# Patient Record
Sex: Male | Born: 1955 | Race: White | Hispanic: No | Marital: Married | Smoking: Never smoker
Health system: Southern US, Community
[De-identification: ages and names within clinical notes are randomized; demographics above are authoritative.]

## PROBLEM LIST (undated history)

## (undated) DIAGNOSIS — K5790 Diverticulosis of intestine, part unspecified, without perforation or abscess without bleeding: Secondary | ICD-10-CM

## (undated) DIAGNOSIS — E119 Type 2 diabetes mellitus without complications: Secondary | ICD-10-CM

## (undated) DIAGNOSIS — F32A Depression, unspecified: Secondary | ICD-10-CM

## (undated) DIAGNOSIS — N200 Calculus of kidney: Secondary | ICD-10-CM

## (undated) DIAGNOSIS — I2510 Atherosclerotic heart disease of native coronary artery without angina pectoris: Secondary | ICD-10-CM

## (undated) DIAGNOSIS — Z7901 Long term (current) use of anticoagulants: Secondary | ICD-10-CM

## (undated) DIAGNOSIS — I499 Cardiac arrhythmia, unspecified: Secondary | ICD-10-CM

## (undated) DIAGNOSIS — I700 Atherosclerosis of aorta: Secondary | ICD-10-CM

## (undated) DIAGNOSIS — K8020 Calculus of gallbladder without cholecystitis without obstruction: Secondary | ICD-10-CM

## (undated) DIAGNOSIS — I5189 Other ill-defined heart diseases: Secondary | ICD-10-CM

## (undated) DIAGNOSIS — D649 Anemia, unspecified: Secondary | ICD-10-CM

## (undated) DIAGNOSIS — K635 Polyp of colon: Secondary | ICD-10-CM

## (undated) DIAGNOSIS — K2950 Unspecified chronic gastritis without bleeding: Secondary | ICD-10-CM

## (undated) DIAGNOSIS — I4891 Unspecified atrial fibrillation: Secondary | ICD-10-CM

## (undated) DIAGNOSIS — F419 Anxiety disorder, unspecified: Secondary | ICD-10-CM

## (undated) DIAGNOSIS — Z87442 Personal history of urinary calculi: Secondary | ICD-10-CM

## (undated) DIAGNOSIS — I480 Paroxysmal atrial fibrillation: Secondary | ICD-10-CM

## (undated) DIAGNOSIS — I10 Essential (primary) hypertension: Secondary | ICD-10-CM

## (undated) HISTORY — PX: OTHER SURGICAL HISTORY: SHX169

---

## 1998-10-07 ENCOUNTER — Encounter: Payer: Self-pay | Admitting: Family Medicine

## 2001-09-06 ENCOUNTER — Encounter: Payer: Self-pay | Admitting: Family Medicine

## 2002-04-06 ENCOUNTER — Encounter: Payer: Self-pay | Admitting: Family Medicine

## 2002-04-06 LAB — CONVERTED CEMR LAB: Hgb A1c MFr Bld: 5.8 %

## 2002-09-06 ENCOUNTER — Encounter: Payer: Self-pay | Admitting: Family Medicine

## 2002-09-06 LAB — CONVERTED CEMR LAB: Hgb A1c MFr Bld: 6.8 %

## 2002-12-07 ENCOUNTER — Encounter: Payer: Self-pay | Admitting: Family Medicine

## 2002-12-07 LAB — CONVERTED CEMR LAB: Hgb A1c MFr Bld: 6.4 %

## 2005-02-12 ENCOUNTER — Ambulatory Visit: Payer: Self-pay | Admitting: Family Medicine

## 2005-02-13 ENCOUNTER — Ambulatory Visit: Payer: Self-pay | Admitting: Family Medicine

## 2005-04-06 ENCOUNTER — Encounter: Payer: Self-pay | Admitting: Family Medicine

## 2005-04-06 LAB — CONVERTED CEMR LAB: Hgb A1c MFr Bld: 6.5 %

## 2005-04-21 ENCOUNTER — Ambulatory Visit: Payer: Self-pay | Admitting: Family Medicine

## 2005-05-07 ENCOUNTER — Encounter: Payer: Self-pay | Admitting: Family Medicine

## 2005-05-07 LAB — CONVERTED CEMR LAB: Hgb A1c MFr Bld: 6.6 %

## 2005-07-07 ENCOUNTER — Encounter: Payer: Self-pay | Admitting: Family Medicine

## 2005-07-29 ENCOUNTER — Ambulatory Visit: Payer: Self-pay | Admitting: Family Medicine

## 2005-08-03 ENCOUNTER — Ambulatory Visit: Payer: Self-pay | Admitting: Family Medicine

## 2005-09-06 ENCOUNTER — Encounter: Payer: Self-pay | Admitting: Family Medicine

## 2005-09-06 LAB — CONVERTED CEMR LAB: Microalbumin U total vol: 5.4 mg/L

## 2005-09-07 ENCOUNTER — Ambulatory Visit: Payer: Self-pay | Admitting: Family Medicine

## 2005-09-09 ENCOUNTER — Ambulatory Visit: Payer: Self-pay | Admitting: Family Medicine

## 2006-01-07 ENCOUNTER — Ambulatory Visit: Payer: Self-pay | Admitting: Family Medicine

## 2006-01-07 LAB — CONVERTED CEMR LAB: Hgb A1c MFr Bld: 6.8 %

## 2006-05-24 ENCOUNTER — Ambulatory Visit: Payer: Self-pay | Admitting: Family Medicine

## 2006-06-06 ENCOUNTER — Encounter: Payer: Self-pay | Admitting: Family Medicine

## 2006-06-23 ENCOUNTER — Ambulatory Visit: Payer: Self-pay | Admitting: Family Medicine

## 2006-09-06 ENCOUNTER — Encounter: Payer: Self-pay | Admitting: Family Medicine

## 2006-09-06 LAB — CONVERTED CEMR LAB
Hgb A1c MFr Bld: 6.6 %
PSA: 0.47 ng/mL

## 2006-09-22 ENCOUNTER — Ambulatory Visit: Payer: Self-pay | Admitting: Family Medicine

## 2006-09-23 ENCOUNTER — Ambulatory Visit: Payer: Self-pay | Admitting: Family Medicine

## 2006-11-05 ENCOUNTER — Ambulatory Visit: Payer: Self-pay | Admitting: Family Medicine

## 2007-03-07 ENCOUNTER — Ambulatory Visit: Payer: Self-pay | Admitting: Family Medicine

## 2007-03-07 LAB — CONVERTED CEMR LAB
Alkaline Phosphatase: 72 units/L (ref 39–117)
Bilirubin, Direct: 0.1 mg/dL (ref 0.0–0.3)
CO2: 24 meq/L (ref 19–32)
Cholesterol: 152 mg/dL (ref 0–200)
Creatinine, Ser: 1 mg/dL (ref 0.4–1.5)
GFR calc Af Amer: 102 mL/min
Glucose, Bld: 133 mg/dL — ABNORMAL HIGH (ref 70–99)
HDL: 31.7 mg/dL — ABNORMAL LOW (ref >39.0)
Hgb A1c MFr Bld: 5.9 %
Microalb Creat Ratio: 2.4 mg/g (ref 0.0–30.0)
Microalb, Ur: 0.5 mg/dL (ref 0.0–1.9)
PSA: 0.69 ng/mL
Potassium: 4.2 meq/L (ref 3.5–5.1)
Sodium: 139 meq/L (ref 135–145)
TSH: 2.84 microintl units/mL (ref 0.35–5.50)
Total Bilirubin: 0.7 mg/dL (ref 0.3–1.2)
Total CHOL/HDL Ratio: 4.8
Total Protein: 6.6 g/dL (ref 6.0–8.3)
Triglycerides: 166 mg/dL — ABNORMAL HIGH (ref 0–149)

## 2007-03-08 ENCOUNTER — Encounter: Payer: Self-pay | Admitting: Family Medicine

## 2007-03-08 DIAGNOSIS — E119 Type 2 diabetes mellitus without complications: Secondary | ICD-10-CM

## 2007-03-08 DIAGNOSIS — I10 Essential (primary) hypertension: Secondary | ICD-10-CM

## 2007-03-08 DIAGNOSIS — F411 Generalized anxiety disorder: Secondary | ICD-10-CM | POA: Insufficient documentation

## 2007-03-08 DIAGNOSIS — Z87442 Personal history of urinary calculi: Secondary | ICD-10-CM

## 2007-03-08 DIAGNOSIS — E785 Hyperlipidemia, unspecified: Secondary | ICD-10-CM

## 2007-03-09 ENCOUNTER — Ambulatory Visit: Payer: Self-pay | Admitting: Family Medicine

## 2007-03-26 DIAGNOSIS — B2790 Infectious mononucleosis, unspecified without complication: Secondary | ICD-10-CM

## 2007-06-08 ENCOUNTER — Encounter (INDEPENDENT_AMBULATORY_CARE_PROVIDER_SITE_OTHER): Payer: Self-pay | Admitting: *Deleted

## 2007-06-08 ENCOUNTER — Ambulatory Visit: Payer: Self-pay | Admitting: Family Medicine

## 2007-08-24 ENCOUNTER — Encounter (INDEPENDENT_AMBULATORY_CARE_PROVIDER_SITE_OTHER): Payer: Self-pay | Admitting: *Deleted

## 2007-09-05 ENCOUNTER — Ambulatory Visit: Payer: Self-pay | Admitting: Family Medicine

## 2007-09-19 ENCOUNTER — Ambulatory Visit: Payer: Self-pay | Admitting: Family Medicine

## 2007-10-31 ENCOUNTER — Encounter: Payer: Self-pay | Admitting: Family Medicine

## 2008-03-15 ENCOUNTER — Ambulatory Visit: Payer: Self-pay | Admitting: Family Medicine

## 2008-03-15 LAB — CONVERTED CEMR LAB
Albumin: 4.1 g/dL (ref 3.5–5.2)
BUN: 12 mg/dL (ref 6–23)
Calcium: 9.2 mg/dL (ref 8.4–10.5)
Cholesterol: 147 mg/dL (ref 0–200)
Creatinine, Ser: 1 mg/dL (ref 0.4–1.5)
Creatinine,U: 168.2 mg/dL
GFR calc Af Amer: 101 mL/min
GFR calc non Af Amer: 83 mL/min
Hgb A1c MFr Bld: 6.9 % — ABNORMAL HIGH (ref 4.6–6.0)
LDL Cholesterol: 84 mg/dL (ref 0–99)
Microalb Creat Ratio: 3.6 mg/g (ref 0.0–30.0)
Microalb, Ur: 0.6 mg/dL (ref 0.0–1.9)
Total Bilirubin: 0.8 mg/dL (ref 0.3–1.2)
Total CHOL/HDL Ratio: 5.4
Triglycerides: 181 mg/dL — ABNORMAL HIGH (ref 0–149)
VLDL: 36 mg/dL (ref 0–40)

## 2008-03-19 ENCOUNTER — Ambulatory Visit: Payer: Self-pay | Admitting: Family Medicine

## 2008-08-10 ENCOUNTER — Ambulatory Visit: Payer: Self-pay | Admitting: Family Medicine

## 2008-08-15 LAB — CONVERTED CEMR LAB
ALT: 29 units/L (ref 0–53)
Albumin: 4.1 g/dL (ref 3.5–5.2)
Alkaline Phosphatase: 65 units/L (ref 39–117)
BUN: 11 mg/dL (ref 6–23)
CO2: 25 meq/L (ref 19–32)
Calcium: 8.9 mg/dL (ref 8.4–10.5)
GFR calc Af Amer: 90 mL/min
Glucose, Bld: 117 mg/dL — ABNORMAL HIGH (ref 70–99)
LDL Cholesterol: 84 mg/dL (ref 0–99)
Microalb Creat Ratio: 6.3 mg/g (ref 0.0–30.0)
Microalb, Ur: 1.3 mg/dL (ref 0.0–1.9)
Potassium: 4.2 meq/L (ref 3.5–5.1)
Sodium: 140 meq/L (ref 135–145)
TSH: 1.82 microintl units/mL (ref 0.35–5.50)
Total CHOL/HDL Ratio: 5.5
Total Protein: 7 g/dL (ref 6.0–8.3)
Triglycerides: 175 mg/dL — ABNORMAL HIGH (ref 0–149)

## 2009-04-08 ENCOUNTER — Telehealth: Payer: Self-pay | Admitting: Family Medicine

## 2010-07-15 ENCOUNTER — Encounter (INDEPENDENT_AMBULATORY_CARE_PROVIDER_SITE_OTHER): Payer: Self-pay | Admitting: *Deleted

## 2011-01-06 NOTE — Letter (Signed)
Summary: Nadara Eaton letter  [place] at [facility]  [address] [place], [place] [address]   Phone: [phone]  Fax: [phone]       07/15/2010 MRN: [id number]  SHAQUEL CHAVOUS [address] [place], [place]  [address]  Dear Mr. Arville Care,  [facility] [place] and [place] announce the retirement of Arta Silence, M.D., from full-time practice at the [facility] office effective June 05, 2010 and his plans of returning part-time.  It is important to Dr. Hetty Ely and to our practice that you understand that [facility] has seven physicians in our office for your health care needs.  We will continue to offer the same exceptional care that you have today.    Dr. Hetty Ely has spoken to many of you about his plans for retirement and returning part-time in the fall.   We will continue to work with you through the transition to schedule appointments for you in the office and meet the high standards that [facility] is committed to.   Again, it is with great pleasure that we share the news that Dr. Hetty Ely will return to [facility] in October of 2011 with a reduced schedule.    If you have any questions, or would like to request an appointment with one of our physicians, please call us at [phone] and press the option for Scheduling an appointment.  We take pleasure in providing you with excellent patient care and look forward to seeing you at your next office visit.  Our [facility] Physicians are:  Tillman Abide, M.D. Laurita Quint, M.D. Roxy Manns, M.D. Kerby Nora, M.D. Hannah Beat, M.D. Ruthe Mannan, M.D. We proudly welcomed Raechel Ache, M.D. and Eustaquio Boyden, M.D. to the practice in July/August 2011.  Sincerely,  [facility]

## 2012-06-13 ENCOUNTER — Emergency Department: Payer: Self-pay | Admitting: Emergency Medicine

## 2012-06-13 LAB — COMPREHENSIVE METABOLIC PANEL
Anion Gap: 12 (ref 7–16)
BUN: 32 mg/dL — ABNORMAL HIGH (ref 7–18)
Calcium, Total: 9.1 mg/dL (ref 8.5–10.1)
Chloride: 97 mmol/L — ABNORMAL LOW (ref 98–107)
Co2: 20 mmol/L — ABNORMAL LOW (ref 21–32)
EGFR (African American): 33 — ABNORMAL LOW
EGFR (Non-African Amer.): 28 — ABNORMAL LOW
Osmolality: 277 (ref 275–301)
Potassium: 4.4 mmol/L (ref 3.5–5.1)
SGOT(AST): 118 U/L — ABNORMAL HIGH (ref 15–37)
SGPT (ALT): 80 U/L — ABNORMAL HIGH
Total Protein: 8.4 g/dL — ABNORMAL HIGH (ref 6.4–8.2)

## 2012-06-13 LAB — CBC
Platelet: 87 10*3/uL — ABNORMAL LOW (ref 150–440)
RDW: 12.8 % (ref 11.5–14.5)

## 2012-06-13 LAB — URINALYSIS, COMPLETE
Granular Cast: 7
Hyaline Cast: 4
Nitrite: NEGATIVE
Ph: 5 (ref 4.5–8.0)
Protein: 100
Specific Gravity: 1.026 (ref 1.003–1.030)

## 2012-06-13 LAB — CK TOTAL AND CKMB (NOT AT ARMC)
CK, Total: 106 U/L (ref 35–232)
CK-MB: <0.5 ng/mL — ABNORMAL LOW (ref 0.5–3.6)

## 2012-06-13 LAB — LIPASE, BLOOD: Lipase: 168 U/L (ref 73–393)

## 2012-06-16 ENCOUNTER — Inpatient Hospital Stay: Payer: Self-pay | Admitting: Internal Medicine

## 2012-06-16 LAB — CBC
MCV: 90 fL (ref 80–100)
Platelet: 39 10*3/uL — ABNORMAL LOW (ref 150–440)
RBC: 5.01 10*6/uL (ref 4.40–5.90)
RDW: 13.2 % (ref 11.5–14.5)
WBC: 10.7 10*3/uL — ABNORMAL HIGH (ref 3.8–10.6)

## 2012-06-16 LAB — APTT: Activated PTT: 34 s

## 2012-06-16 LAB — COMPREHENSIVE METABOLIC PANEL
Albumin: 3 g/dL — ABNORMAL LOW (ref 3.4–5.0)
Alkaline Phosphatase: 75 U/L (ref 50–136)
BUN: 36 mg/dL — ABNORMAL HIGH (ref 7–18)
Bilirubin,Total: 1.5 mg/dL — ABNORMAL HIGH (ref 0.2–1.0)
Co2: 19 mmol/L — ABNORMAL LOW (ref 21–32)
Creatinine: 1.66 mg/dL — ABNORMAL HIGH (ref 0.60–1.30)
EGFR (African American): 53 — ABNORMAL LOW
Glucose: 188 mg/dL — ABNORMAL HIGH (ref 65–99)
Osmolality: 276 (ref 275–301)
Potassium: 3.7 mmol/L (ref 3.5–5.1)
SGPT (ALT): 86 U/L — ABNORMAL HIGH
Sodium: 131 mmol/L — ABNORMAL LOW (ref 136–145)
Total Protein: 6.8 g/dL (ref 6.4–8.2)

## 2012-06-16 LAB — PROTIME-INR
INR: 1.2
Prothrombin Time: 15.8 secs — ABNORMAL HIGH (ref 11.5–14.7)

## 2012-06-16 LAB — CBC WITH DIFFERENTIAL/PLATELET
Basophil #: 0 10*3/uL (ref 0.0–0.1)
HCT: 42.3 % (ref 40.0–52.0)
HGB: 14.3 g/dL (ref 13.0–18.0)
Lymphocyte #: 2.2 10*3/uL (ref 1.0–3.6)
MCH: 30.4 pg (ref 26.0–34.0)
MCV: 90 fL (ref 80–100)
Monocyte #: 1.5 x10 3/mm — ABNORMAL HIGH (ref 0.2–1.0)
Monocyte %: 16.2 %
Neutrophil #: 5.6 10*3/uL (ref 1.4–6.5)
Neutrophil %: 59.1 %
Platelet: 45 10*3/uL — ABNORMAL LOW (ref 150–440)
RDW: 12.9 % (ref 11.5–14.5)

## 2012-06-16 LAB — HEMOGLOBIN: HGB: 14.4 g/dL (ref 13.0–18.0)

## 2012-06-16 LAB — LACTATE DEHYDROGENASE: LDH: 599 U/L — ABNORMAL HIGH (ref 87–241)

## 2012-06-16 LAB — CK TOTAL AND CKMB (NOT AT ARMC)
CK, Total: 155 U/L
CK, Total: 121 U/L (ref 35–232)
CK-MB: <0.5 ng/mL — ABNORMAL LOW

## 2012-06-16 LAB — PLATELET COUNT: Platelet: 46 10*3/uL — ABNORMAL LOW (ref 150–440)

## 2012-06-16 LAB — CREATININE, SERUM: EGFR (African American): 59 — ABNORMAL LOW

## 2012-06-16 LAB — TROPONIN I
Troponin-I: <0.02 ng/mL
Troponin-I: <0.02 ng/mL

## 2012-06-16 LAB — HEMOGLOBIN A1C: Hemoglobin A1C: 7.2 % — ABNORMAL HIGH (ref 4.2–6.3)

## 2012-06-17 LAB — CBC WITH DIFFERENTIAL/PLATELET
Comment - H1-Com1: NORMAL
Eosinophil: 1 %
HCT: 41.7 % (ref 40.0–52.0)
HGB: 14.3 g/dL (ref 13.0–18.0)
MCH: 31.1 pg (ref 26.0–34.0)
MCV: 91 fL (ref 80–100)
Monocytes: 7 %
RBC: 4.6 10*6/uL (ref 4.40–5.90)
Variant Lymphocyte - H1-Rlymph: 7 %

## 2012-06-17 LAB — BASIC METABOLIC PANEL WITH GFR
Anion Gap: 13
BUN: 28 mg/dL — ABNORMAL HIGH
Calcium, Total: 8.2 mg/dL — ABNORMAL LOW
Chloride: 100 mmol/L
Co2: 22 mmol/L
Creatinine: 1.43 mg/dL — ABNORMAL HIGH
EGFR (African American): >60
EGFR (Non-African Amer.): 54 — ABNORMAL LOW
Glucose: 179 mg/dL — ABNORMAL HIGH
Osmolality: 280
Potassium: 3.3 mmol/L — ABNORMAL LOW
Sodium: 135 mmol/L — ABNORMAL LOW

## 2012-06-17 LAB — HEPATIC FUNCTION PANEL A (ARMC)
Albumin: 2.7 g/dL — ABNORMAL LOW
Alkaline Phosphatase: 67 U/L
Bilirubin, Direct: 0.6 mg/dL — ABNORMAL HIGH
Bilirubin,Total: 1.2 mg/dL — ABNORMAL HIGH
SGOT(AST): 84 U/L — ABNORMAL HIGH
SGPT (ALT): 73 U/L
Total Protein: 5.9 g/dL — ABNORMAL LOW

## 2012-06-17 LAB — LACTATE DEHYDROGENASE: LDH: 527 U/L — ABNORMAL HIGH

## 2012-06-17 LAB — RAPID HIV-1/2 QL/CONFIRM: HIV-1/2,Rapid Ql: NEGATIVE

## 2012-06-17 LAB — TROPONIN I: Troponin-I: <0.02 ng/mL

## 2012-06-18 LAB — CBC WITH DIFFERENTIAL/PLATELET
Comment - H1-Com1: NORMAL
Comment - H1-Com2: NORMAL
Eosinophil: 3 %
HCT: 40 % (ref 40.0–52.0)
HGB: 14 g/dL (ref 13.0–18.0)
Lymphocytes: 28 %
MCH: 31.2 pg (ref 26.0–34.0)
MCHC: 35 g/dL (ref 32.0–36.0)
Monocytes: 18 %
Platelet: 97 10*3/uL — ABNORMAL LOW (ref 150–440)
RBC: 4.49 10*6/uL (ref 4.40–5.90)
Segmented Neutrophils: 38 %
Variant Lymphocyte - H1-Rlymph: 4 %

## 2012-06-18 LAB — COMPREHENSIVE METABOLIC PANEL
Albumin: 2.6 g/dL — ABNORMAL LOW (ref 3.4–5.0)
Alkaline Phosphatase: 71 U/L (ref 50–136)
Anion Gap: 11 (ref 7–16)
Bilirubin,Total: 1 mg/dL (ref 0.2–1.0)
Creatinine: 1.36 mg/dL — ABNORMAL HIGH (ref 0.60–1.30)
Glucose: 165 mg/dL — ABNORMAL HIGH (ref 65–99)
Osmolality: 277 (ref 275–301)
Potassium: 3.3 mmol/L — ABNORMAL LOW (ref 3.5–5.1)
SGOT(AST): 135 U/L — ABNORMAL HIGH (ref 15–37)
Sodium: 135 mmol/L — ABNORMAL LOW (ref 136–145)
Total Protein: 6 g/dL — ABNORMAL LOW (ref 6.4–8.2)

## 2012-06-19 LAB — BASIC METABOLIC PANEL
Anion Gap: 14 (ref 7–16)
BUN: 16 mg/dL (ref 7–18)
Chloride: 100 mmol/L (ref 98–107)
Co2: 22 mmol/L (ref 21–32)
Creatinine: 1.32 mg/dL — ABNORMAL HIGH (ref 0.60–1.30)
EGFR (African American): >60
Glucose: 171 mg/dL — ABNORMAL HIGH (ref 65–99)
Potassium: 3.5 mmol/L (ref 3.5–5.1)
Sodium: 136 mmol/L (ref 136–145)

## 2012-06-19 LAB — CBC WITH DIFFERENTIAL/PLATELET
Eosinophil: 2 %
HCT: 40.2 % (ref 40.0–52.0)
Lymphocytes: 12 %
MCV: 91 fL (ref 80–100)
Platelet: 153 10*3/uL (ref 150–440)
RBC: 4.41 10*6/uL (ref 4.40–5.90)
Segmented Neutrophils: 34 %
Variant Lymphocyte - H1-Rlymph: 42 %
WBC: 19.8 10*3/uL — ABNORMAL HIGH (ref 3.8–10.6)

## 2012-06-19 LAB — MAGNESIUM: Magnesium: 1.8 mg/dL

## 2012-06-29 ENCOUNTER — Ambulatory Visit: Payer: Self-pay | Admitting: Internal Medicine

## 2012-07-05 ENCOUNTER — Ambulatory Visit: Payer: Self-pay | Admitting: Internal Medicine

## 2012-07-05 LAB — CBC CANCER CENTER
Basophil #: 0.1 x10 3/mm (ref 0.0–0.1)
Basophil %: 1.1 %
Eosinophil #: 0.2 x10 3/mm (ref 0.0–0.7)
Eosinophil %: 1.8 %
HCT: 42.1 % (ref 40.0–52.0)
HGB: 14.4 g/dL (ref 13.0–18.0)
Lymphocyte #: 5.4 x10 3/mm — ABNORMAL HIGH (ref 1.0–3.6)
Lymphocyte %: 52.5 %
MCH: 31.6 pg (ref 26.0–34.0)
MCHC: 34.2 g/dL (ref 32.0–36.0)
MCV: 93 fL (ref 80–100)
Monocyte #: 0.8 x10 3/mm (ref 0.2–1.0)
Monocyte %: 7.9 %
Neutrophil #: 3.8 x10 3/mm (ref 1.4–6.5)
Neutrophil %: 36.7 %
Platelet: 119 x10 3/mm — ABNORMAL LOW (ref 150–440)
RBC: 4.55 10*6/uL (ref 4.40–5.90)
RDW: 13.5 % (ref 11.5–14.5)
WBC: 10.3 x10 3/mm (ref 3.8–10.6)

## 2012-07-05 LAB — ALT: SGPT (ALT): 47 U/L

## 2012-07-05 LAB — SGOT (AST)(ARMC): SGOT(AST): 29 U/L (ref 15–37)

## 2012-07-07 ENCOUNTER — Ambulatory Visit: Payer: Self-pay | Admitting: Internal Medicine

## 2012-07-07 LAB — PROTIME-INR: INR: 0.9

## 2012-08-02 LAB — CBC CANCER CENTER
Basophil #: 0.1 x10 3/mm (ref 0.0–0.1)
Eosinophil #: 0.2 x10 3/mm (ref 0.0–0.7)
Eosinophil %: 2.2 %
HCT: 41.5 % (ref 40.0–52.0)
Lymphocyte #: 3.8 x10 3/mm — ABNORMAL HIGH (ref 1.0–3.6)
Lymphocyte %: 41.6 %
MCH: 32 pg (ref 26.0–34.0)
MCHC: 34.8 g/dL (ref 32.0–36.0)
Monocyte #: 0.5 x10 3/mm (ref 0.2–1.0)
Platelet: 163 x10 3/mm (ref 150–440)
RDW: 13.4 % (ref 11.5–14.5)

## 2012-08-07 ENCOUNTER — Ambulatory Visit: Payer: Self-pay | Admitting: Internal Medicine

## 2012-09-06 ENCOUNTER — Ambulatory Visit: Payer: Self-pay | Admitting: Internal Medicine

## 2012-09-06 LAB — CBC CANCER CENTER
Basophil #: 0.1 x10 3/mm (ref 0.0–0.1)
Eosinophil #: 0.3 x10 3/mm (ref 0.0–0.7)
Eosinophil %: 3 %
Lymphocyte #: 3.8 x10 3/mm — ABNORMAL HIGH (ref 1.0–3.6)
MCH: 31.6 pg (ref 26.0–34.0)
MCHC: 34.1 g/dL (ref 32.0–36.0)
MCV: 93 fL (ref 80–100)
Monocyte #: 0.8 x10 3/mm (ref 0.2–1.0)
Platelet: 148 x10 3/mm — ABNORMAL LOW (ref 150–440)
RBC: 4.63 10*6/uL (ref 4.40–5.90)
RDW: 13.5 % (ref 11.5–14.5)
WBC: 10.3 x10 3/mm (ref 3.8–10.6)

## 2012-09-27 LAB — CBC CANCER CENTER
Basophil %: 1.1 %
Eosinophil %: 2.1 %
HCT: 44.2 % (ref 40.0–52.0)
HGB: 15.2 g/dL (ref 13.0–18.0)
Lymphocyte #: 4.8 x10 3/mm — ABNORMAL HIGH (ref 1.0–3.6)
Lymphocyte %: 37.8 %
MCH: 31.7 pg (ref 26.0–34.0)
MCV: 92 fL (ref 80–100)
Monocyte #: 0.9 x10 3/mm (ref 0.2–1.0)
Platelet: 146 x10 3/mm — ABNORMAL LOW (ref 150–440)
RBC: 4.78 10*6/uL (ref 4.40–5.90)

## 2012-10-07 ENCOUNTER — Ambulatory Visit: Payer: Self-pay | Admitting: Internal Medicine

## 2013-09-15 ENCOUNTER — Ambulatory Visit: Payer: Self-pay | Admitting: Internal Medicine

## 2014-04-05 ENCOUNTER — Ambulatory Visit: Payer: Self-pay

## 2014-04-05 ENCOUNTER — Emergency Department: Payer: Self-pay | Admitting: Emergency Medicine

## 2014-04-05 LAB — CBC
HCT: 50.9 % (ref 40.0–52.0)
HGB: 16.8 g/dL (ref 13.0–18.0)
MCH: 30.7 pg (ref 26.0–34.0)
MCHC: 33 g/dL (ref 32.0–36.0)
MCV: 93 fL (ref 80–100)
Platelet: 190 10*3/uL (ref 150–440)
RBC: 5.47 10*6/uL (ref 4.40–5.90)
RDW: 12.9 % (ref 11.5–14.5)
WBC: 13.7 10*3/uL — ABNORMAL HIGH (ref 3.8–10.6)

## 2014-04-05 LAB — COMPREHENSIVE METABOLIC PANEL
ALK PHOS: 81 U/L
ALT: 45 U/L (ref 12–78)
ANION GAP: 9 (ref 7–16)
Albumin: 4 g/dL (ref 3.4–5.0)
BILIRUBIN TOTAL: 0.6 mg/dL (ref 0.2–1.0)
BUN: 14 mg/dL (ref 7–18)
CHLORIDE: 102 mmol/L (ref 98–107)
Calcium, Total: 9.7 mg/dL (ref 8.5–10.1)
Co2: 25 mmol/L (ref 21–32)
Creatinine: 1.03 mg/dL (ref 0.60–1.30)
EGFR (African American): >60
GLUCOSE: 172 mg/dL — AB (ref 65–99)
Osmolality: 277 (ref 275–301)
Potassium: 4.4 mmol/L (ref 3.5–5.1)
SGOT(AST): 33 U/L (ref 15–37)
SODIUM: 136 mmol/L (ref 136–145)
Total Protein: 8.5 g/dL — ABNORMAL HIGH (ref 6.4–8.2)

## 2015-03-31 NOTE — H&P (Signed)
PATIENT NAME:  Nicolas Zuniga, Nicolas Zuniga MR#:  [id number] DATE OF BIRTH:  17-Jul-1956  DATE OF ADMISSION:  06/16/2012  PRIMARY CARE PHYSICIAN: Calla Kicks, MD   CHIEF COMPLAINT: Weakness and palpitations.   HISTORY OF PRESENT ILLNESS: This is a 59 year old male who presents to the Emergency Room due to progressive weakness and palpitations and shortness of breath that has been going on for the past few days. The patient presented to the ER about two days ago having some bloating and some nausea and vomiting and was noted to have a possible GI illness. He was hydrated with 2 liters of IV fluids in the Emergency Room and then discharged home. He says his nausea and vomiting has improved, although in the past few days he has been feeling increasingly weak and started feeling some palpitations. Therefore, he came to the ER for further evaluation. In the Emergency Room today, the patient was noted to be in rapid atrial fibrillation with heart rates in the 120s to 130s. He received one dose of IV Cardizem with some slowing of his heart rate but it is still persists in the low 120s to 130s. Hospitalist service was then contacted for further treatment and evaluation.   The patient denies any chest pain. He denies any orthopnea. He denies any syncope. He denies any abdominal pain, diarrhea or any other associated symptoms presently.   REVIEW OF SYSTEMS: CONSTITUTIONAL: No documented fever. No weight gain or weight loss. EYES: No blurred or double vision. ENT: No tinnitus. No postnasal drip. No redness of the oropharynx. RESPIRATORY: No cough, no wheeze, and no hemoptysis. CARDIOVASCULAR: No chest pain, no orthopnea. Positive palpitations. No syncope. GI: No nausea, no vomiting, no diarrhea, no abdominal pain, no melena and no hematochezia. GU: No dysuria or hematuria. ENDOCRINE: No polyuria or nocturia. No heat or cold intolerance. HEME: No anemia, no bruising, and no bleeding. INTEGUMENTARY: No rashes. No lesions.  MUSCULOSKELETAL: No arthritis, no swelling, and no gout. NEUROLOGIC: No numbness. No tingling. No ataxia. No seizure-type activity. PSYCH: No anxiety, no insomnia, and no ADD.   PAST MEDICAL HISTORY: Diabetes.   ALLERGIES: No known drug allergies.   SOCIAL HISTORY: No smoking. No alcohol abuse. No illicit drug abuse. Lives at home with his wife. Works as a Engineer, site.   FAMILY HISTORY: His mother has diabetes; she is still alive. Father has chronic obstructive pulmonary disease and also has history of bypass surgery.   CURRENT MEDICATIONS: Byetta 10 mcg twice a day.   PHYSICAL EXAMINATION:   VITALS: Temperature 96, pulse 140, respirations 20, blood pressure 90/63, and saturation 95% on room air.   GENERAL: The patient is a pleasant appearing male in no apparent distress.   HEENT: Atraumatic, normocephalic. Extraocular muscles are intact. Pupils are equal and reactive to light. Sclerae anicteric. No conjunctival injection. No pharyngeal erythema.   NECK: Supple. No jugular venous distention. No bruits, no lymphadenopathy, and no thyromegaly.   HEART: Irregular, tachycardic. No murmurs, no rubs, and no clicks.   LUNGS: Clear to auscultation bilaterally. No rales, no rhonchi, and no wheezes.   ABDOMEN: Soft, flat, nontender, and nondistended. Has good bowel sounds. No hepatosplenomegaly appreciated.   EXTREMITIES: No evidence of any cyanosis, clubbing, or peripheral edema. Has +2 pedal and radial pulses bilaterally.   NEUROLOGICAL: The patient is alert, awake, and oriented x3 with no focal motor or sensory deficits appreciated bilaterally.   SKIN: Moist and warm with no rash appreciated.   LYMPHATIC: There is no cervical  or axillary lymphadenopathy.   LABORATORY AND RADIOLOGIC DATA: Serum glucose 188, BUN 36, creatinine 1.6, sodium 131, potassium 3.7, chloride 97, and bicarbonate 19. LFTs were slightly abnormal with a total bilirubin of 1.5, albumin of 3.0, AST of 99, and ALT  of 86. Troponin less than 0.02. White cell count 10.7, hemoglobin 15.8, hematocrit 45, and platelet count 39,000.   The patient did undergo a chest x-ray which shows no evidence of acute cardiopulmonary disease.   ASSESSMENT AND PLAN: This is a 59 year old male with a history of diabetes who presents to the hospital with new onset atrial fibrillation.  1. New onset atrial fibrillation. The patient's rates are currently uncontrolled. He received one dose of IV Cardizem in the ER with some slight improvement in heart rate, although he still needs further rate control. I discussed with cardiology, Dr. Humphrey Rolls, who prefers starting him on an amiodarone drip in hopes to convert him to a sinus rhythm. Therefore, I will give him an amiodarone bolus, start him on a drip. His CHADS II score is about 2, but I cannot anticoagulate him given his thrombocytopenia. He did receive one dose of Lovenox in the Emergency Room. I will follow his serial cardiac markers, get a two-dimensional echocardiogram, follow him clinically, and get a cardiology consultation, and as mentioned, I did discuss with Dr. Neoma Laming.  2. Acute renal failure. The exact etiology is unclear, but likely related to ATN and probably underlying new onset atrial fibrillation. His creatinine has improved in the past two days, has come down from 2.4 to 1.6. I will not give him IV fluids for now, but encourage p.o. intake, and follow BUN, creatinine, and urine output. I will get a renal ultrasound for now.  3. Thrombocytopenia. The exact etiology is also unclear. He has had no evidence of any acute bleeding or bruising. He has not noticed any petechiae or any rashes throughout his body. I will hold any heparin products for now. We will get an oncology consult.  4. Diabetes. I will continue his Byetta and place him on sliding scale insulin coverage. We will also check a Hemoglobin A1c.             CODE STATUS: The patient is a FULL CODE.   TIME  SPENT ON ADMISSION: 50 minutes. ____________________________ Belia Heman. Verdell Carmine, MD vjs:slb D: 06/16/2012 13:10:57 ET T: 06/16/2012 13:21:57 ET JOB#: [id number]  cc: Belia Heman. Verdell Carmine, MD, <Dictator> Dory Horn. Eliberto Ivory, MD Henreitta Leber MD ELECTRONICALLY SIGNED 06/16/2012 14:16

## 2015-03-31 NOTE — Consult Note (Signed)
PATIENT NAME:  Nicolas Zuniga, Nicolas Zuniga MR#:  [id number] DATE OF BIRTH:  July 12, 1956  DATE OF CONSULTATION:  06/16/2012  REFERRING PHYSICIAN:   CONSULTING PHYSICIAN:  Dionisio David, MD  HISTORY OF PRESENT ILLNESS:  This is a 59 year old white male with a past medical history of hypertension and diabetes who came into the Emergency Room with shortness of breath. He said he also felt PND, orthopnea, and palpitations. He was feeling nauseous and felt like he was going to throw up, thus he came to the Emergency Room.  He was just admitted recently with an upper respiratory tract infection and was found to have low platelets as well as renal insufficiency. He was discharged with creatinine over 2.6, but right now it is around 1.6.  Also his platelet count is below 40 and he comes in with multiple issues.   PAST MEDICAL HISTORY:  1. Hypertension. 2. Diabetes.  3. Renal insufficiency. 4. Thrombocytopenia.   SOCIAL HISTORY: Denies EtOH abuse, also denies smoking.   FAMILY HISTORY:  Unremarkable.   PHYSICAL EXAMINATION:  GENERAL: He is alert, oriented times three, in no acute distress.   VITAL SIGNS: Stable except heart rate of 146.   NECK: No JVD.   LUNGS: Clear.   HEART: Irregularly irregular pulse. Normal S1, S2. No audible murmur.   ABDOMEN: Soft, nontender, positive bowel sounds.   EXTREMITIES: No pedal edema.   EKG shows atrial fibrillation with a rate of 146 beats per minute, left axis deviation, Q waves in lead III and aVF suggestive of old inferior wall myocardial infarction.  ASSESSMENT AND PLAN: The patient has thrombocytopenia, renal insufficiency, new onset atrial fibrillation with rapid ventricular response rate, and symptoms of congestive heart failure. Advise starting the patient on IV amiodarone. He does have a [place] score of 2, but because of thrombocytopenia he is not a candidate for anticoagulation at this time. Advise getting hematology- oncology consultation to further evaluate  because he is at risk of thromboembolic phenomena. We will try to convert him to sinus rhythm. Thank you very much for the referral.   ____________________________ Dionisio David, MD sak:bjt D: 06/16/2012 16:30:18 ET T: 06/16/2012 17:26:28 ET JOB#: [id number]  cc: Dionisio David, MD, <Dictator> Dionisio David MD ELECTRONICALLY SIGNED 06/23/2012 13:13

## 2015-03-31 NOTE — Consult Note (Signed)
Brief Consult Note: Diagnosis: THROMBOCYTOPENIA, AFIB, RENAL FAILURE, VIRAL SYNDROME,.   Patient was seen by consultant.   Orders entered.   Comments: SEE DICTATED NOTE TO FOLLOW. SEEN AND EVALUATED. HX OF ILLNESS STARTED 1 WEEK AGO, NAUSEA, BRIEF DIARRHEA, VOMITING, FEVERS, 3 DAYS LATER, 7/8, TO ER WITH PRERENAL AZOTEMIA, DEHYDRATION, THROMBOCYTOPENIA, HOME AFTER IV FLUUID, NOW READMITTED WITH AFIB, AZOTEMIA BUT IMPROVED, PROGRESSIVE THROMBOCYTOPENIA, MILD LIVER FUNCTION ABNORMALITIES. ON EXAM A FIB, ABDO BENIGN, NODES NEG, NEURO NON FOCAL, NO EDEMA, NO RASH OR BRUISING, AFEBRILE.Marland Kitchen PLTS 45, UP FROM 39, HGB STABLE 14.4, WBC UNREMARKABLE, CRE 1,56 SLOWLY IMPROVING, MILD ABNORMAL LFTS, HIGH LDH, PERIPHERAL SMEAR EXAMED NO MICROANGIOPATHY...IMP LOW PLTS MOST LIKELY VIRAL SYNDROME DIRECT MARROW SUPPRESSION, NO EVIDENCE TTP, COULD VE VIRAL INDUCED ITP, BUT PLTS SLIGHT IMPROVED, NO BLEEDING, BASELINE CBC UNKNOWN , ABNORMAL LFT ALSO CONSISTENT WITH VIRAL, NO ETOH HX, AFIB ? POSSIBLE VIRAL MYOCARDITIS.  DEFER TO GI AND MEDICINE FOR OTHER ISSUES, OF COURSE F/U CRE AND LFT, CONSIDER HEPATITIS PROFILE. FROM HEME ONC POINT OF VIEW, F/U CBC AND LDH, IF PLTS DECLINE WOULD GIVE STEROIDS , AND THEN CHECK U/S FOR SPLEEN SIZE AND CHECK HEP C AND HIV, ALSO AMIODORONE CAN DECREASE PLTS SO IF NOT PROMT RECOVERY WILL DISCUSS CHANGING CARDIAC MEDS. IF BLEED, WOULD GIVE PREDNISONE STAT AND TRANSFUSE PLTS.  Electronic Signatures: Dallas Schimke (MD)  (Signed 11-Jul-13 23:56)  Authored: Brief Consult Note   Last Updated: 11-Jul-13 23:56 by Dallas Schimke (MD)

## 2015-03-31 NOTE — Discharge Summary (Signed)
PATIENT NAME:  Nicolas Zuniga, MCMATH MR#:  [id number] DATE OF BIRTH:  July 01, 1956  DATE OF ADMISSION:  06/16/2012 DATE OF DISCHARGE:  06/19/2012  PRIMARY CARE PHYSICIAN: Dr. Eliberto Ivory  CARDIOLOGIST: Dr. Humphrey Rolls  HEMATOLOGIST: Dr. Inez Pilgrim    FINAL DIAGNOSES:  1. New onset rapid atrial fibrillation which converted to normal sinus rhythm.  2. Acute renal failure and dehydration.  3. Thrombocytopenia, possible viral cause.  4. Diabetes.  5. Hypokalemia.  6. Constipation.   MEDICATIONS ON DISCHARGE:  1. Byetta 10 mcg/0.04 mL subcutaneous solution injection twice a day.  2. Start new medication of aspirin 81 mg daily.  3. Amiodarone 200 mg 2 tablets twice a day.   FOLLOW UP: Follow up with Dr. Humphrey Rolls Tuesday at 10:00 a.m. Follow up in 2 to 4 weeks with Dr. Calla Kicks. Follow up in 1 to 2 weeks with Dr. Inez Pilgrim, hematology,   REASON FOR ADMISSION: Patient was admitted 06/16/2012, discharged 06/19/2012. Patient came in with weakness and palpitations.   HISTORY OF PRESENT ILLNESS: A 59 year old man presented to the ER with progressive weakness and palpitation, shortness of breath, had some bloating and nausea and vomiting, noted to have a possible viral illness, was given 2 liters of fluid in the Emergency Room and discharged home. His nausea and vomiting improved. Over the past few days had increased weak feeling. In the Emergency Room when he came back was found to be in rapid atrial fibrillation, 120 to 130. Received one dose of IV Cardizem. Dr. Humphrey Rolls, cardiology, was consulted and started him on an amiodarone drip. Unable to anticoagulate secondary to thrombocytopenia. For his acute renal failure he was given IV fluid hydration. For his thrombocytopenia consultation by Dr. Inez Pilgrim was done. Dr. Inez Pilgrim believed that this could be secondary to a viral syndrome and he sent off some blood work.   LABORATORY, DIAGNOSTIC AND RADIOLOGICAL DATA: Hemoglobin A1c 7.2, phosphorus 2.7, magnesium 1.9, glucose 188, BUN 36,  creatinine 1.66, sodium 131, potassium 3.7, chloride 97, CO2 19, calcium 8.4, total bilirubin 1.5, alkaline phosphatase 75, ALT 86, AST 99, albumin 3.0, white blood cell count 10.7, hemoglobin and hematocrit 15.8 and 45.0, platelet count 39. PT 15.8, INR 1.21, PTT 34.0. Troponin negative. EKG showed atrial fibrillation, 146 beats per minute, rapid ventricular response, left axis deviation. Chest x-ray showed no acute cardiopulmonary disease. V/Q scan low probability for pulmonary embolism. LDH 599. Repeat hemoglobin 14.4. Ultrasound of the kidneys showed no obstructive uropathy. Postvoid residual 269. Platelets on 07/11 came up to 36. Third troponin negative. Creatinine on 07/12 down to 1.43, platelets on 07/12 57. HIV test was negative. Echocardiogram showed ejection fraction 50% to 55%, trace mitral regurgitation. Ultrasound of the abdomen showed splenomegaly. Creatinine upon discharge 1.32. Potassium upon discharge 3.5, platelet count upon discharge 153,000, hemoglobin 13.7.   HOSPITAL COURSE PER PROBLEM LIST:  1. For the patient's new onset rapid atrial fibrillation, the patient was initially started on amiodarone drip, Cardizem short-acting was added to help control heart rate. The patient converted to normal sinus rhythm on the evening of 07/13. He was switched over to p.o. amiodarone on 07/14. He will be sent home on p.o. amiodarone 200 mg 2 tablets twice a day. Quick follow up with Dr. Humphrey Rolls, cardiology, on Tuesday at 10:00 a.m. for titration of amiodarone down. Because of his thrombocytopenia hesitant on anticoagulation but since platelet count did recover will start on low-dose aspirin.  2. For the patient's acute renal failure and dehydration, he was given IV fluid hydration. Creatinine improved  upon discharge.  3. For the patient's thrombocytopenia, possible viral cause, seen by Dr. Inez Pilgrim in consultation. Hepatitis A, B and C panel still pending. HIV test was negative. Most likely this was secondary  to the viral syndrome.  4. For his diabetes, he is on Byetta.  5. For his hypokalemia, this was replaced during the hospital course.  6. For his constipation, he did have a bowel movement with Dulcolax and Colace during the hospital stay.       TIME SPENT ON DISCHARGE: 35 minutes.   ____________________________ Tana Conch. Leslye Peer, MD rjw:cms D: 06/19/2012 15:37:16 ET T: 06/19/2012 15:44:14 ET JOB#: [id number]  cc: Tana Conch. Leslye Peer, MD, <Dictator> Dory Horn. Eliberto Ivory, MD Dionisio David, MD Simonne Come. Inez Pilgrim, MD Marisue Brooklyn MD ELECTRONICALLY SIGNED 06/20/2012 13:15

## 2016-06-25 ENCOUNTER — Ambulatory Visit: Admission: RE | Admit: 2016-06-25 | Payer: Self-pay | Source: Ambulatory Visit | Admitting: Internal Medicine

## 2016-06-25 ENCOUNTER — Encounter: Admission: RE | Payer: Self-pay | Source: Ambulatory Visit

## 2016-06-25 SURGERY — ECHOCARDIOGRAM, TRANSESOPHAGEAL
Anesthesia: Moderate Sedation

## 2016-07-09 ENCOUNTER — Ambulatory Visit
Admission: RE | Admit: 2016-07-09 | Discharge: 2016-07-09 | Disposition: A | Discharge status: Home / Self Care | Payer: BLUE CROSS/BLUE SHIELD | Source: Ambulatory Visit | Attending: Cardiovascular Disease | Admitting: Cardiovascular Disease

## 2016-07-09 ENCOUNTER — Ambulatory Visit: Payer: BLUE CROSS/BLUE SHIELD | Admitting: Anesthesiology

## 2016-07-09 ENCOUNTER — Encounter
Admission: RE | Disposition: A | Discharge status: Home / Self Care | Payer: Self-pay | Source: Ambulatory Visit | Attending: Cardiovascular Disease

## 2016-07-09 DIAGNOSIS — Z7901 Long term (current) use of anticoagulants: Secondary | ICD-10-CM | POA: Insufficient documentation

## 2016-07-09 DIAGNOSIS — I440 Atrioventricular block, first degree: Secondary | ICD-10-CM | POA: Diagnosis not present

## 2016-07-09 DIAGNOSIS — I340 Nonrheumatic mitral (valve) insufficiency: Secondary | ICD-10-CM | POA: Insufficient documentation

## 2016-07-09 DIAGNOSIS — I4892 Unspecified atrial flutter: Secondary | ICD-10-CM | POA: Insufficient documentation

## 2016-07-09 DIAGNOSIS — Z87891 Personal history of nicotine dependence: Secondary | ICD-10-CM | POA: Diagnosis not present

## 2016-07-09 DIAGNOSIS — E119 Type 2 diabetes mellitus without complications: Secondary | ICD-10-CM | POA: Diagnosis not present

## 2016-07-09 DIAGNOSIS — Z7984 Long term (current) use of oral hypoglycemic drugs: Secondary | ICD-10-CM | POA: Insufficient documentation

## 2016-07-09 DIAGNOSIS — I10 Essential (primary) hypertension: Secondary | ICD-10-CM | POA: Insufficient documentation

## 2016-07-09 DIAGNOSIS — Z79899 Other long term (current) drug therapy: Secondary | ICD-10-CM | POA: Insufficient documentation

## 2016-07-09 HISTORY — PX: TEE WITHOUT CARDIOVERSION: SHX5443

## 2016-07-09 HISTORY — DX: Type 2 diabetes mellitus without complications: E11.9

## 2016-07-09 HISTORY — DX: Essential (primary) hypertension: I10

## 2016-07-09 HISTORY — DX: Unspecified atrial fibrillation: I48.91

## 2016-07-09 HISTORY — PX: ELECTROPHYSIOLOGIC STUDY: SHX172A

## 2016-07-09 SURGERY — ECHOCARDIOGRAM, TRANSESOPHAGEAL
Anesthesia: General

## 2016-07-09 MED ORDER — MIDAZOLAM HCL 5 MG/5ML IJ SOLN
INTRAMUSCULAR | Status: AC | PRN
  Administered 2016-07-09: 1 mg via INTRAVENOUS
  Administered 2016-07-09: 2 mg via INTRAVENOUS

## 2016-07-09 MED ORDER — SODIUM CHLORIDE 0.9 % IV SOLN: INTRAVENOUS | Status: DC

## 2016-07-09 MED ORDER — SODIUM CHLORIDE 0.9% FLUSH: 3.0000 mL | INTRAVENOUS | Status: DC | PRN

## 2016-07-09 MED ORDER — FENTANYL CITRATE (PF) 100 MCG/2ML IJ SOLN
INTRAMUSCULAR | Status: AC
  Filled 2016-07-09: qty 2

## 2016-07-09 MED ORDER — SODIUM CHLORIDE 0.9% FLUSH: 3.0000 mL | Freq: Two times a day (BID) | INTRAVENOUS | Status: DC

## 2016-07-09 MED ORDER — SODIUM CHLORIDE 0.9 % IV SOLN
INTRAVENOUS | Status: DC
  Administered 2016-07-09: 09:00:00 via INTRAVENOUS

## 2016-07-09 MED ORDER — FENTANYL CITRATE (PF) 100 MCG/2ML IJ SOLN
INTRAMUSCULAR | Status: AC | PRN
  Administered 2016-07-09: 25 ug via INTRAVENOUS
  Administered 2016-07-09: 50 ug via INTRAVENOUS

## 2016-07-09 MED ORDER — SODIUM CHLORIDE 0.9 % IV SOLN: 250.0000 mL | INTRAVENOUS | Status: DC

## 2016-07-09 MED ORDER — PROPOFOL 10 MG/ML IV BOLUS
INTRAVENOUS | Status: DC | PRN
  Administered 2016-07-09: 50 mg via INTRAVENOUS

## 2016-07-09 MED ORDER — SODIUM CHLORIDE 0.9 % IV SOLN
Freq: Once | INTRAVENOUS | Status: AC
  Administered 2016-07-09: 07:00:00 via INTRAVENOUS

## 2016-07-09 MED ORDER — MIDAZOLAM HCL 5 MG/5ML IJ SOLN
INTRAMUSCULAR | Status: AC
  Filled 2016-07-09: qty 5

## 2016-07-09 NOTE — Discharge Instructions (Signed)
Electrical Cardioversion, Care After °Refer to this sheet in the next few weeks. These instructions provide you with information on caring for yourself after your procedure. Your health care provider may also give you more specific instructions. Your treatment has been planned according to current medical practices, but problems sometimes occur. Call your health care provider if you have any problems or questions after your procedure. °WHAT TO EXPECT AFTER THE PROCEDURE °After your procedure, it is typical to have the following sensations: °· Some redness on the skin where the shocks were delivered. If this is tender, a sunburn lotion or hydrocortisone cream may help. °· Possible return of an abnormal heart rhythm within hours or days after the procedure. °HOME CARE INSTRUCTIONS °· Take medicines only as directed by your health care provider. Be sure you understand how and when to take your medicine. °· Learn how to feel your pulse and check it often. °· Limit your activity for 48 hours after the procedure or as directed by your health care provider. °· Avoid or minimize caffeine and other stimulants as directed by your health care provider. °SEEK MEDICAL CARE IF: °· You feel like your heart is beating too fast or your pulse is not regular. °· You have any questions about your medicines. °· You have bleeding that will not stop. °SEEK IMMEDIATE MEDICAL CARE IF: °· You are dizzy or feel faint. °· It is hard to breathe or you feel short of breath. °· There is a change in discomfort in your chest. °· Your speech is slurred or you have trouble moving an arm or leg on one side of your body. °· You get a serious muscle cramp that does not go away. °· Your fingers or toes turn cold or blue. °  °This information is not intended to replace advice given to you by your health care provider. Make sure you discuss any questions you have with your health care provider. °  °Document Released: 09/13/2013 Document Revised: 12/14/2014  Document Reviewed: 09/13/2013 °Elsevier Interactive Patient Education ©2016 Elsevier Inc[employer] °Transesophageal Echocardiogram °Transesophageal echocardiography (TEE) is a special type of test that produces images of the heart by using sound waves (echocardiogram). This type of echocardiography can obtain better images of the heart than standard echocardiography. TEE is done by passing a flexible tube down the esophagus. The heart is located in front of the esophagus. Because the heart and esophagus are close to one another, your health care provider can take very clear, detailed pictures of the heart via ultrasound waves. °TEE may be done: °· If your health care provider needs more information based on standard echocardiography findings. °· If you had a stroke. This might have happened because a clot formed in your heart. TEE can visualize different areas of the heart and check for clots. °· To check valve anatomy and function. °· To check for infection on the inside of your heart (endocarditis). °· To evaluate the dividing wall (septum) of the heart and presence of a hole that did not close after birth (patent foramen ovale or atrial septal defect). °· To help diagnose a tear in the wall of the aorta (aortic dissection). °· During cardiac valve surgery. This allows the surgeon to assess the valve repair before closing the chest. °· During a variety of other cardiac procedures to guide positioning of catheters. °· Sometimes before a cardioversion, which is a shock to convert heart rhythm back to normal. °LET YOUR HEALTH CARE PROVIDER KNOW ABOUT:  °· Any allergies you   have. °· All medicines you are taking, including vitamins, herbs, eye drops, creams, and over-the-counter medicines. °· Previous problems you or members of your family have had with the use of anesthetics. °· Any blood disorders you have. °· Previous surgeries you have had. °· Medical conditions you have. °· Swallowing difficulties. °· An esophageal  obstruction. °RISKS AND COMPLICATIONS  °Generally, TEE is a safe procedure. However, as with any procedure, complications can occur. Possible complications include an esophageal tear (rupture). °BEFORE THE PROCEDURE  °· Do not eat or drink for 6 hours before the procedure or as directed by your health care provider. °· Arrange for someone to drive you home after the procedure. Do not drive yourself home. During the procedure, you will be given medicines that can continue to make you feel drowsy and can impair your reflexes. °· An IV access tube will be started in the arm. °PROCEDURE  °· A medicine to help you relax (sedative) will be given through the IV access tube. °· A medicine may be sprayed or gargled to numb the back of the throat. °· Your blood pressure, heart rate, and breathing (vital signs) will be monitored during the procedure. °· The TEE probe is a long, flexible tube. The tip of the probe is placed into the back of the mouth, and you will be asked to swallow. This helps to pass the tip of the probe into the esophagus. Once the tip of the probe is in the correct area, your health care provider can take pictures of the heart. °· TEE is usually not a painful procedure. You may feel the probe press against the back of the throat. The probe does not enter the trachea and does not affect your breathing. °AFTER THE PROCEDURE  °· You will be in bed, resting, until you have fully returned to consciousness. °· When you first awaken, your throat may feel slightly sore and will probably still feel numb. This will improve slowly over time. °· You will not be allowed to eat or drink until it is clear that the numbness has improved. °· Once you have been able to drink, urinate, and sit on the edge of the bed without feeling sick to your stomach (nausea) or dizzy, you may be cleared to go home. °· You should have a friend or family member with you for the next 24 hours after your procedure. °  °This information is not  intended to replace advice given to you by your health care provider. Make sure you discuss any questions you have with your health care provider. °  °Document Released: 02/13/2003 Document Revised: 11/28/2013 Document Reviewed: 05/25/2013 °Elsevier Interactive Patient Education ©2016 Elsevier Inc. ° °

## 2016-07-09 NOTE — Progress Notes (Signed)
Patient tolerated TEE and cardioversion well. No adverse affects from anesthesia. Patient awake and oriented. NSR.

## 2016-07-09 NOTE — Anesthesia Postprocedure Evaluation (Signed)
Anesthesia Post Note  Patient: Nicolas Zuniga  Procedure(s) Performed: Procedure(s) (LRB): TRANSESOPHAGEAL ECHOCARDIOGRAM (TEE) (N/A) CARDIOVERSION (N/A)  Patient location during evaluation: Other    Last Vitals:  Vitals:   07/09/16 0853 07/09/16 0900  BP: 113/75 121/81  Pulse: 60 64  Resp: 16 11  Temp:      Last Pain:  Vitals:   07/09/16 0945  TempSrc:   PainSc: 0-No pain                 [name]

## 2016-07-09 NOTE — Anesthesia Preprocedure Evaluation (Signed)
Anesthesia Evaluation  Patient identified by MRN, date of birth, ID band Patient awake    Reviewed: Allergy & Precautions, NPO status , Patient's Chart, lab work & pertinent test results, reviewed documented beta blocker date and time   Airway Mallampati: III  TM Distance: >3 FB     Dental  (+) Chipped   Pulmonary           Cardiovascular hypertension, Pt. on medications and Pt. on home beta blockers      Neuro/Psych Anxiety    GI/Hepatic   Endo/Other  diabetes, Type 2  Renal/GU      Musculoskeletal   Abdominal   Peds  Hematology   Anesthesia Other Findings Obese.  Reproductive/Obstetrics                             Anesthesia Physical Anesthesia Plan  ASA: III  Anesthesia Plan: General   Post-op Pain Management:    Induction: Intravenous  Airway Management Planned: Mask  Additional Equipment:   Intra-op Plan:   Post-operative Plan:   Informed Consent: I have reviewed the patients History and Physical, chart, labs and discussed the procedure including the risks, benefits and alternatives for the proposed anesthesia with the patient or authorized representative who has indicated his/her understanding and acceptance.     Plan Discussed with: CRNA  Anesthesia Plan Comments:         Anesthesia Quick Evaluation

## 2016-07-09 NOTE — Progress Notes (Signed)
*  PRELIMINARY RESULTS* Echocardiogram Echocardiogram Transesophageal has been performed.  Nicolas Zuniga 07/09/2016, 8:53 AM

## 2016-07-09 NOTE — Transfer of Care (Signed)
Immediate Anesthesia Transfer of Care Note  Patient: Nicolas Zuniga  Procedure(s) Performed: Procedure(s): TRANSESOPHAGEAL ECHOCARDIOGRAM (TEE) (N/A) CARDIOVERSION (N/A)  Patient Location: PACU  Anesthesia Type:General  Level of Consciousness: awake  Airway & Oxygen Therapy: Patient Spontanous Breathing and Patient connected to nasal cannula oxygen  Post-op Assessment: Report given to RN and Post -op Vital signs reviewed and stable  Post vital signs: stable  Last Vitals:  Vitals:   07/09/16 0844 07/09/16 0845  BP:  121/88  Pulse: (!) 57 (!) 57  Resp:  16  Temp:      Last Pain:  Vitals:   07/09/16 0712  TempSrc: Oral  PainSc: 0-No pain         Complications: No apparent anesthesia complications

## 2016-07-09 NOTE — Progress Notes (Signed)
  NAME:  KAIYON BIRKE   MRN: [id number] DOB:  September 30, 1956   ADMIT DATE: 07/09/2016  Procedure: Electrical Cardioversion Indications:  Atrial Flutter  Procedure Details:    Time Out: Verified patient identification, verified procedure, site/side was marked, verified correct patient position, special equipment/implants available, medications/allergies/relevent history reviewed, required imaging and test results available.    Patient placed on cardiac monitor, pulse oximetry, supplemental oxygen as necessary.  Sedation given:  Pacer pads placed   Cardioverted . 100 J Cardioverted at 100 J Evaluation: Findings: Post procedure EKG shows:  Complications: No complication Patient did well.     Dionisio David, M.D. [facility]   07/09/2016 8:57 AM

## 2016-07-10 ENCOUNTER — Encounter: Payer: Self-pay | Admitting: Cardiovascular Disease

## 2017-12-27 ENCOUNTER — Emergency Department
Admission: EM | Admit: 2017-12-27 | Discharge: 2017-12-27 | Disposition: A | Discharge status: Home / Self Care | Payer: BLUE CROSS/BLUE SHIELD | Attending: Emergency Medicine | Admitting: Emergency Medicine

## 2017-12-27 ENCOUNTER — Emergency Department: Payer: BLUE CROSS/BLUE SHIELD

## 2017-12-27 ENCOUNTER — Other Ambulatory Visit: Payer: Self-pay

## 2017-12-27 ENCOUNTER — Encounter: Payer: Self-pay | Admitting: *Deleted

## 2017-12-27 DIAGNOSIS — Z7901 Long term (current) use of anticoagulants: Secondary | ICD-10-CM | POA: Insufficient documentation

## 2017-12-27 DIAGNOSIS — N200 Calculus of kidney: Secondary | ICD-10-CM | POA: Insufficient documentation

## 2017-12-27 DIAGNOSIS — E119 Type 2 diabetes mellitus without complications: Secondary | ICD-10-CM | POA: Insufficient documentation

## 2017-12-27 DIAGNOSIS — R112 Nausea with vomiting, unspecified: Secondary | ICD-10-CM | POA: Diagnosis not present

## 2017-12-27 DIAGNOSIS — Z7984 Long term (current) use of oral hypoglycemic drugs: Secondary | ICD-10-CM | POA: Diagnosis not present

## 2017-12-27 DIAGNOSIS — Z79899 Other long term (current) drug therapy: Secondary | ICD-10-CM | POA: Diagnosis not present

## 2017-12-27 DIAGNOSIS — I10 Essential (primary) hypertension: Secondary | ICD-10-CM | POA: Diagnosis not present

## 2017-12-27 DIAGNOSIS — R1032 Left lower quadrant pain: Secondary | ICD-10-CM | POA: Diagnosis present

## 2017-12-27 LAB — COMPREHENSIVE METABOLIC PANEL
ALK PHOS: 70 U/L (ref 38–126)
ALT: 26 U/L (ref 17–63)
AST: 26 U/L (ref 15–41)
Albumin: 4.5 g/dL (ref 3.5–5.0)
Anion gap: 15 (ref 5–15)
BILIRUBIN TOTAL: 1.1 mg/dL (ref 0.3–1.2)
BUN: 17 mg/dL (ref 6–20)
CALCIUM: 9.7 mg/dL (ref 8.9–10.3)
CO2: 21 mmol/L — ABNORMAL LOW (ref 22–32)
Chloride: 101 mmol/L (ref 101–111)
Creatinine, Ser: 1.25 mg/dL — ABNORMAL HIGH (ref 0.61–1.24)
Glucose, Bld: 231 mg/dL — ABNORMAL HIGH (ref 65–99)
Potassium: 4.5 mmol/L (ref 3.5–5.1)
Sodium: 137 mmol/L (ref 135–145)
Total Protein: 8.4 g/dL — ABNORMAL HIGH (ref 6.5–8.1)

## 2017-12-27 LAB — URINALYSIS, ROUTINE W REFLEX MICROSCOPIC
Bacteria, UA: NONE SEEN
Bilirubin Urine: NEGATIVE
GLUCOSE, UA: 50 mg/dL — AB
Hgb urine dipstick: NEGATIVE
Ketones, ur: 20 mg/dL — AB
LEUKOCYTES UA: NEGATIVE
Nitrite: NEGATIVE
PH: 5 (ref 5.0–8.0)
Protein, ur: 30 mg/dL — AB
SPECIFIC GRAVITY, URINE: 1.028 (ref 1.005–1.030)

## 2017-12-27 LAB — CBC
HCT: 47.9 % (ref 40.0–52.0)
Hemoglobin: 16.2 g/dL (ref 13.0–18.0)
MCH: 31.9 pg (ref 26.0–34.0)
MCHC: 33.9 g/dL (ref 32.0–36.0)
MCV: 94.2 fL (ref 80.0–100.0)
Platelets: 164 10*3/uL (ref 150–440)
RBC: 5.09 MIL/uL (ref 4.40–5.90)
RDW: 12.5 % (ref 11.5–14.5)
WBC: 18.6 10*3/uL — AB (ref 3.8–10.6)

## 2017-12-27 MED ORDER — MORPHINE SULFATE (PF) 2 MG/ML IV SOLN
2.0000 mg | Freq: Once | INTRAVENOUS | Status: AC
  Administered 2017-12-27: 2 mg via INTRAVENOUS
  Filled 2017-12-27: qty 1

## 2017-12-27 MED ORDER — KETOROLAC TROMETHAMINE 30 MG/ML IJ SOLN
30.0000 mg | Freq: Once | INTRAMUSCULAR | Status: AC
  Administered 2017-12-27: 30 mg via INTRAVENOUS
  Filled 2017-12-27: qty 1

## 2017-12-27 MED ORDER — ONDANSETRON HCL 4 MG/2ML IJ SOLN
4.0000 mg | Freq: Once | INTRAMUSCULAR | Status: AC
  Administered 2017-12-27: 4 mg via INTRAVENOUS
  Filled 2017-12-27: qty 2

## 2017-12-27 MED ORDER — SODIUM CHLORIDE 0.9 % IV BOLUS (SEPSIS)
1000.0000 mL | Freq: Once | INTRAVENOUS | Status: AC
  Administered 2017-12-27: 1000 mL via INTRAVENOUS

## 2017-12-27 MED ORDER — TAMSULOSIN HCL 0.4 MG PO CAPS: 0.4000 mg | ORAL_CAPSULE | Freq: Every day | ORAL | 0 refills | Status: DC

## 2017-12-27 MED ORDER — MORPHINE SULFATE (PF) 4 MG/ML IV SOLN
4.0000 mg | Freq: Once | INTRAVENOUS | Status: AC
  Administered 2017-12-27: 4 mg via INTRAVENOUS
  Filled 2017-12-27: qty 1

## 2017-12-27 MED ORDER — ONDANSETRON 4 MG PO TBDP: 4.0000 mg | ORAL_TABLET | Freq: Three times a day (TID) | ORAL | 0 refills | Status: DC | PRN

## 2017-12-27 MED ORDER — OXYCODONE-ACETAMINOPHEN 5-325 MG PO TABS: 1.0000 | ORAL_TABLET | ORAL | 0 refills | Status: DC | PRN

## 2017-12-27 NOTE — ED Provider Notes (Signed)
[facility] Emergency Department Provider Note   First MD Initiated Contact with Patient 12/27/17 1848     (approximate)  I have reviewed the triage vital signs and the nursing notes.   HISTORY  Chief Complaint Flank Pain    HPI Nicolas Zuniga is a 62 y.o. male with below list of chronic medical conditions including previous kidney stone presents to the emergency department with 3-day history of left lower quadrant/flank sharp/aching pain which is currently 3 out of 10.  Patient states maximum intensity 8 out of 10.  Patient also admits to nausea and vomiting.  Patient denies any fever.  Patient denies any hematuria dysuria or urgency or frequency.   Past Medical History:  Diagnosis Date  . A-fib ([place])   . Diabetes mellitus without complication ([place])   . Hypertension     Patient Active Problem List   Diagnosis Date Noted  . 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 DIABETES MELLITUS, TYPE II 03/08/2007  . HYPERLIPIDEMIA 03/08/2007  . ANXIETY 03/08/2007  . HYPERTENSION 03/08/2007  . HX, PERSONAL, URINARY CALCULI 03/08/2007    Past Surgical History:  Procedure Laterality Date  . ELECTROPHYSIOLOGIC STUDY N/A 07/09/2016   Procedure: CARDIOVERSION;  Surgeon: Dionisio David, MD;  Location: [facility] ORS;  Service: Cardiovascular;  Laterality: N/A;  . TEE WITHOUT CARDIOVERSION N/A 07/09/2016   Procedure: TRANSESOPHAGEAL ECHOCARDIOGRAM (TEE);  Surgeon: Dionisio David, MD;  Location: [facility] ORS;  Service: Cardiovascular;  Laterality: N/A;    Prior to Admission medications   Medication Sig Start Date End Date Taking? Authorizing Provider  apixaban (ELIQUIS) 5 MG TABS tablet Take 5 mg by mouth 2 (two) times daily.    [provider]  cholecalciferol (VITAMIN D) 1000 units tablet Take 1,000 Units by mouth daily.    [provider]  exenatide (BYETTA) 10 MCG/0.04ML SOPN injection Inject 10 mcg into the skin 2 (two) times daily with a meal.     [provider]  flecainide (TAMBOCOR) 100 MG tablet Take 100 mg by mouth 2 (two) times daily.    [provider]  glipiZIDE (GLUCOTROL XL) 10 MG 24 hr tablet Take 20 mg by mouth daily with breakfast.    [provider]  metoprolol (LOPRESSOR) 100 MG tablet Take 100 mg by mouth 2 (two) times daily.    [provider]  Multiple Vitamins-Minerals (MULTIVITAMIN WITH MINERALS) tablet Take 1 tablet by mouth daily.    [provider]  ondansetron (ZOFRAN ODT) 4 MG disintegrating tablet Take 1 tablet (4 mg total) by mouth every 8 (eight) hours as needed for nausea or vomiting. 12/27/17   Gregor Hams, MD  oxyCODONE-acetaminophen (PERCOCET/ROXICET) 5-325 MG tablet Take 1 tablet by mouth every 4 (four) hours as needed for severe pain. 12/27/17   Gregor Hams, MD  tamsulosin [facility]) 0.4 MG CAPS capsule Take 1 capsule (0.4 mg total) by mouth daily after breakfast. 12/27/17   Gregor Hams, MD  vitamin B-12 (CYANOCOBALAMIN) 500 MCG tablet Take 500 mcg by mouth daily.    [provider]  vitamin C (ASCORBIC ACID) 500 MG tablet Take 500 mg by mouth daily.    [provider]    Allergies Patient has no known allergies.  No family history on file.  Social History Social History   Tobacco Use  . Smoking status: Never Smoker  . Smokeless tobacco: Never Used  Substance Use Topics  . Alcohol use: No  . Drug use: No  Review of Systems Constitutional: No fever/chills Eyes: No visual changes. ENT: No sore throat. Cardiovascular: Denies chest pain. Respiratory: Denies shortness of breath. Gastrointestinal: Positive for nausea no diarrhea.  No constipation.  Positive for left flank/left lower quadrant pain genitourinary: Negative for dysuria. Musculoskeletal: Negative for neck pain.  Negative for back pain. Integumentary: Negative for rash. Neurological: Negative for headaches, focal weakness or  numbness.  ____________________________________________   PHYSICAL EXAM:  VITAL SIGNS: ED Triage Vitals  Enc Vitals Group     BP 12/27/17 1616 (!) 142/81     Pulse Rate 12/27/17 1616 73     Resp 12/27/17 1616 20     Temp 12/27/17 1616 97.7 F (36.5 C)     Temp Source 12/27/17 1616 Oral     SpO2 12/27/17 1616 99 %     Weight 12/27/17 1618 136.1 kg (300 lb)     Height 12/27/17 1618 1.88 m (6\' 2" )     Head Circumference --      Peak Flow --      Pain Score 12/27/17 1618 5     Pain Loc --      Pain Edu? --      Excl. in [place]? --     Constitutional: Alert and oriented. Well appearing and in no acute distress. Eyes: Conjunctivae are normal. Head: Atraumatic. Mouth/Throat: Mucous membranes are moist.  Oropharynx non-erythematous. Neck: No stridor. Cardiovascular: Normal rate, regular rhythm. Good peripheral circulation. Grossly normal heart sounds. Respiratory: Normal respiratory effort.  No retractions. Lungs CTAB. Gastrointestinal: Soft and nontender. No distention.  Musculoskeletal: No lower extremity tenderness nor edema. No gross deformities of extremities. Neurologic:  Normal speech and language. No gross focal neurologic deficits are appreciated.  Skin:  Skin is warm, dry and intact. No rash noted. Psychiatric: Mood and affect are normal. Speech and behavior are normal.  ____________________________________________   LABS (all labs ordered are listed, but only abnormal results are displayed)  Labs Reviewed  COMPREHENSIVE METABOLIC PANEL - Abnormal; Notable for the following components:      Result Value   CO2 21 (*)    Glucose, Bld 231 (*)    Creatinine, Ser 1.25 (*)    Total Protein 8.4 (*)    All other components within normal limits  CBC - Abnormal; Notable for the following components:   WBC 18.6 (*)    All other components within normal limits  URINALYSIS, ROUTINE W REFLEX MICROSCOPIC - Abnormal; Notable for the following components:   Color, Urine AMBER  (*)    APPearance HAZY (*)    Glucose, UA 50 (*)    Ketones, ur 20 (*)    Protein, ur 30 (*)    Squamous Epithelial / LPF 0-5 (*)    All other components within normal limits     RADIOLOGY I, Frederika N Takira Sherrin, personally viewed and evaluated these images (plain radiographs) as part of my medical decision making, as well as reviewing the written report by the radiologist.  CLINICAL DATA:  Pt states left flank pain since Saturday, intermittent/worsening, pt states last kidney stones 2004.  EXAM: CT ABDOMEN AND PELVIS WITHOUT CONTRAST  TECHNIQUE: Multidetector CT imaging of the abdomen and pelvis was performed following the standard protocol without IV contrast.  COMPARISON:  None.  FINDINGS: Lower chest: No acute abnormality.  Hepatobiliary: No focal liver abnormality is seen. Subtle small hyperdense foci in the dependent aspect of the gallbladder may represent partially calcified stones. The gallbladder is physiologically distended without adjacent inflammatory or  edematous change. No biliary ductal dilatation.  Pancreas: Unremarkable. No pancreatic ductal dilatation or surrounding inflammatory changes.  Spleen: Normal in size without focal abnormality. Accessory splenules.  Adrenals/Urinary Tract: Normal adrenals. Normal right kidney. Left nephrolithiasis with mild hydronephrosis and ureterectasis down to the level of a 0.4 cm calculus, just proximal to the ureteral orifice. Urinary bladder is nondistended.  Stomach/Bowel: Stomach is incompletely distended. Small bowel decompressed. Normal appendix. The colon is nondilated. Scattered diverticula from descending and sigmoid portions without significant adjacent inflammatory/edematous change.  Vascular/Lymphatic: Mild scattered aortoiliac arterial calcifications without aneurysm. No abdominal or pelvic adenopathy localized.  Reproductive: Moderate prostatic enlargement with central  coarse calcifications.  Other: No ascites.  No free air.  Musculoskeletal: Degenerative changes in bilateral hips. Spurring in the lumbar spine. Negative for fracture or worrisome bone lesion.  IMPRESSION: 1. 4 mm obstructing distal left ureteral calculus. 2. Possible cholelithiasis.   Electronically Signed   By: Lucrezia Europe M.D.   On: 12/27/2017 19:54   Procedures   ____________________________________________   INITIAL IMPRESSION / ASSESSMENT AND PLAN / ED COURSE  As part of my medical decision making, I reviewed the following data within the electronic MEDICAL RECORD [id number] year old male presenting to the emergency department history and physical exam consistent with left ureterolithiasis insert the possibility of diverticulosis/diverticulitis however symptoms and physical exam most consistent with ureterolithiasis.  As such CT scan of the abdomen obstructing left ureter stone.  Patient's pain worsened while in the emergency department requiring IV morphine followed by complete resolution of pain.  In addition patient was given Toradol 30 mg.  Patient advised to follow-up with urology Dr. Louis Meckel today.  ________________________________  FINAL CLINICAL IMPRESSION(S) / ED DIAGNOSES  Final diagnoses:  Kidney stone     MEDICATIONS GIVEN DURING THIS VISIT:  Medications  sodium chloride 0.9 % bolus 1,000 mL (0 mLs Intravenous Stopped 12/27/17 2046)  sodium chloride 0.9 % bolus 1,000 mL (0 mLs Intravenous Stopped 12/27/17 2046)  morphine 2 MG/ML injection 2 mg (2 mg Intravenous Given 12/27/17 1921)  ondansetron (ZOFRAN) injection 4 mg (4 mg Intravenous Given 12/27/17 1921)  morphine 4 MG/ML injection 4 mg (4 mg Intravenous Given 12/27/17 2010)  ketorolac (TORADOL) 30 MG/ML injection 30 mg (30 mg Intravenous Given 12/27/17 2011)     ED Discharge Orders        Ordered    oxyCODONE-acetaminophen (PERCOCET/ROXICET) 5-325 MG tablet  Every 4 hours PRN     12/27/17 2008     ondansetron (ZOFRAN ODT) 4 MG disintegrating tablet  Every 8 hours PRN     12/27/17 2008    tamsulosin (FLOMAX) 0.4 MG CAPS capsule  Daily after breakfast     12/27/17 2008       Note:  This document was prepared using Dragon voice recognition software and may include unintentional dictation errors.    Gregor Hams, MD 12/29/17 [phone]

## 2017-12-27 NOTE — ED Triage Notes (Signed)
Pt has left flank pain.  Pt has n/v.  Hx of kidney stones.  Pt alert.  Sx began 3 days ago.

## 2017-12-27 NOTE — ED Notes (Signed)
See triage note  Presents with pain to left flank area since Saturday states pain is intermittent in intensity  Some nausea

## 2018-01-14 ENCOUNTER — Ambulatory Visit: Payer: Self-pay

## 2018-04-13 DIAGNOSIS — Z91199 Patient's noncompliance with other medical treatment and regimen due to unspecified reason: Secondary | ICD-10-CM | POA: Insufficient documentation

## 2018-12-14 DIAGNOSIS — I480 Paroxysmal atrial fibrillation: Secondary | ICD-10-CM | POA: Insufficient documentation

## 2018-12-22 ENCOUNTER — Encounter
Admission: RE | Disposition: A | Discharge status: Home / Self Care | Payer: Self-pay | Source: Home / Self Care | Attending: Internal Medicine

## 2018-12-22 ENCOUNTER — Ambulatory Visit: Payer: BLUE CROSS/BLUE SHIELD | Admitting: Anesthesiology

## 2018-12-22 ENCOUNTER — Ambulatory Visit
Admission: RE | Admit: 2018-12-22 | Discharge: 2018-12-22 | Disposition: A | Discharge status: Home / Self Care | Payer: BLUE CROSS/BLUE SHIELD | Attending: Internal Medicine | Admitting: Internal Medicine

## 2018-12-22 ENCOUNTER — Other Ambulatory Visit: Payer: Self-pay

## 2018-12-22 DIAGNOSIS — Z7984 Long term (current) use of oral hypoglycemic drugs: Secondary | ICD-10-CM | POA: Diagnosis not present

## 2018-12-22 DIAGNOSIS — I480 Paroxysmal atrial fibrillation: Secondary | ICD-10-CM | POA: Insufficient documentation

## 2018-12-22 DIAGNOSIS — Z79899 Other long term (current) drug therapy: Secondary | ICD-10-CM | POA: Insufficient documentation

## 2018-12-22 DIAGNOSIS — Z7901 Long term (current) use of anticoagulants: Secondary | ICD-10-CM | POA: Insufficient documentation

## 2018-12-22 DIAGNOSIS — I10 Essential (primary) hypertension: Secondary | ICD-10-CM | POA: Insufficient documentation

## 2018-12-22 DIAGNOSIS — E119 Type 2 diabetes mellitus without complications: Secondary | ICD-10-CM | POA: Insufficient documentation

## 2018-12-22 HISTORY — PX: CARDIOVERSION: EP1203

## 2018-12-22 LAB — GLUCOSE, CAPILLARY: Glucose-Capillary: 223 mg/dL — ABNORMAL HIGH (ref 70–99)

## 2018-12-22 SURGERY — CARDIOVERSION (CATH LAB)
Anesthesia: General

## 2018-12-22 MED ORDER — SODIUM CHLORIDE 0.9 % IV SOLN
INTRAVENOUS | Status: DC
  Administered 2018-12-22: 1000 mL via INTRAVENOUS

## 2018-12-22 MED ORDER — FLECAINIDE ACETATE 150 MG PO TABS: 150.0000 mg | ORAL_TABLET | Freq: Two times a day (BID) | ORAL | 4 refills | Status: DC

## 2018-12-22 MED ORDER — PROPOFOL 10 MG/ML IV BOLUS
INTRAVENOUS | Status: DC | PRN
  Administered 2018-12-22: 60 mg via INTRAVENOUS

## 2018-12-22 MED ORDER — PROPOFOL 10 MG/ML IV BOLUS
INTRAVENOUS | Status: AC
  Filled 2018-12-22: qty 20

## 2018-12-22 NOTE — Anesthesia Post-op Follow-up Note (Signed)
Anesthesia QCDR form completed.        

## 2018-12-22 NOTE — Transfer of Care (Signed)
Immediate Anesthesia Transfer of Care Note  Patient: Nicolas Zuniga  Procedure(s) Performed: CARDIOVERSION (N/A )  Patient Location: PACU  Anesthesia Type:General  Level of Consciousness: awake, alert  and oriented  Airway & Oxygen Therapy: Patient Spontanous Breathing and Patient connected to nasal cannula oxygen  Post-op Assessment: Report given to RN and Post -op Vital signs reviewed and stable  Post vital signs: Reviewed and stable  Last Vitals:  Vitals Value Taken Time  BP    Temp    Pulse 98 12/22/2018  7:31 AM  Resp 21 12/22/2018  7:31 AM  SpO2 98 % 12/22/2018  7:31 AM  Vitals shown include unvalidated device data.  Last Pain:  Vitals:   12/22/18 0657  TempSrc: Oral  PainSc: 0-No pain         Complications: No apparent anesthesia complications

## 2018-12-22 NOTE — Anesthesia Procedure Notes (Signed)
Date/Time: 12/22/2018 7:37 AM Performed by: Nelda Marseille, CRNA Pre-anesthesia Checklist: Patient identified, Emergency Drugs available, Suction available, Patient being monitored and Timeout performed Oxygen Delivery Method: Nasal cannula

## 2018-12-22 NOTE — CV Procedure (Signed)
Electrical Cardioversion Procedure Note Nicolas Zuniga [id number] May 09, 1956  Procedure: Electrical Cardioversion Indications:  Atrial Fibrillation  Procedure Details Consent: Risks of procedure as well as the alternatives and risks of each were explained to the (patient/caregiver).  Consent for procedure obtained. Time Out: Verified patient identification, verified procedure, site/side was marked, verified correct patient position, special equipment/implants available, medications/allergies/relevent history reviewed, required imaging and test results available.  Performed  Patient placed on cardiac monitor, pulse oximetry, supplemental oxygen as necessary.  Sedation given: Short-acting barbiturates Pacer pads placed anterior and posterior chest.  Cardioverted 1 time(s).  Cardioverted at 120J.  Evaluation Findings: Post procedure EKG shows: NSR Complications: None Patient did tolerate procedure well.   Corey Skains 12/22/2018, 7:37 AM

## 2018-12-29 NOTE — Anesthesia Postprocedure Evaluation (Signed)
Anesthesia Post Note  Patient: Nicolas Zuniga  Procedure(s) Performed: CARDIOVERSION (CATH LAB) (N/A )  Patient location during evaluation: PACU Anesthesia Type: General Level of consciousness: awake and alert Pain management: pain level controlled Vital Signs Assessment: post-procedure vital signs reviewed and stable Respiratory status: spontaneous breathing, nonlabored ventilation, respiratory function stable and patient connected to nasal cannula oxygen Cardiovascular status: blood pressure returned to baseline and stable Postop Assessment: no apparent nausea or vomiting Anesthetic complications: no     Last Vitals:  Vitals:   12/22/18 0800 12/22/18 0815  BP: 116/76 121/76  Pulse:  65  Resp:  12  Temp:    SpO2:  95%    Last Pain:  Vitals:   12/22/18 0815  TempSrc:   PainSc: 0-No pain                 Molli Barrows

## 2018-12-29 NOTE — Anesthesia Preprocedure Evaluation (Signed)
Anesthesia Evaluation  Patient identified by MRN, date of birth, ID band Patient awake    Reviewed: Allergy & Precautions, H&P , NPO status , Patient's Chart, lab work & pertinent test results, reviewed documented beta blocker date and time   Airway Mallampati: II   Neck ROM: full    Dental  (+) Poor Dentition   Pulmonary neg pulmonary ROS,    Pulmonary exam normal        Cardiovascular hypertension, negative cardio ROS Normal cardiovascular exam Rhythm:regular Rate:Normal     Neuro/Psych negative neurological ROS  negative psych ROS   GI/Hepatic negative GI ROS, Neg liver ROS,   Endo/Other  negative endocrine ROSdiabetes  Renal/GU negative Renal ROS  negative genitourinary   Musculoskeletal   Abdominal   Peds  Hematology negative hematology ROS (+)   Anesthesia Other Findings Past Medical History: No date: A-fib ([place]) No date: Diabetes mellitus without complication ([place]) No date: Hypertension Past Surgical History: 12/22/2018: CARDIOVERSION; N/A     Comment:  Procedure: CARDIOVERSION (CATH LAB);  Surgeon: Corey Skains, MD;  Location: [facility] ORS;  Service:               Cardiovascular;  Laterality: N/A; 07/09/2016: ELECTROPHYSIOLOGIC STUDY; N/A     Comment:  Procedure: CARDIOVERSION;  Surgeon: Dionisio David, MD;               Location: [facility] ORS;  Service: Cardiovascular;                Laterality: N/A; 07/09/2016: TEE WITHOUT CARDIOVERSION; N/A     Comment:  Procedure: TRANSESOPHAGEAL ECHOCARDIOGRAM (TEE);                Surgeon: Dionisio David, MD;  Location: [facility] ORS;                Service: Cardiovascular;  Laterality: N/A; BMI    Body Mass Index:  38.52 kg/m     Reproductive/Obstetrics negative OB ROS                             Anesthesia Physical Anesthesia Plan  ASA: III  Anesthesia Plan: General   Post-op Pain Management:    Induction:   PONV Risk  Score and Plan:   Airway Management Planned:   Additional Equipment:   Intra-op Plan:   Post-operative Plan:   Informed Consent: I have reviewed the patients History and Physical, chart, labs and discussed the procedure including the risks, benefits and alternatives for the proposed anesthesia with the patient or authorized representative who has indicated his/her understanding and acceptance.     Dental Advisory Given  Plan Discussed with: CRNA  Anesthesia Plan Comments:         Anesthesia Quick Evaluation

## 2018-12-30 DIAGNOSIS — R001 Bradycardia, unspecified: Secondary | ICD-10-CM | POA: Insufficient documentation

## 2020-02-29 ENCOUNTER — Ambulatory Visit: Payer: Self-pay

## 2020-03-07 ENCOUNTER — Other Ambulatory Visit: Payer: Self-pay

## 2020-03-07 ENCOUNTER — Emergency Department: Payer: BC Managed Care – PPO

## 2020-03-07 ENCOUNTER — Encounter: Payer: Self-pay | Admitting: Emergency Medicine

## 2020-03-07 ENCOUNTER — Emergency Department
Admission: EM | Admit: 2020-03-07 | Discharge: 2020-03-07 | Disposition: A | Discharge status: Home / Self Care | Payer: BC Managed Care – PPO | Attending: Student | Admitting: Student

## 2020-03-07 DIAGNOSIS — I10 Essential (primary) hypertension: Secondary | ICD-10-CM | POA: Diagnosis not present

## 2020-03-07 DIAGNOSIS — Z7901 Long term (current) use of anticoagulants: Secondary | ICD-10-CM | POA: Insufficient documentation

## 2020-03-07 DIAGNOSIS — E119 Type 2 diabetes mellitus without complications: Secondary | ICD-10-CM | POA: Diagnosis not present

## 2020-03-07 DIAGNOSIS — Z7984 Long term (current) use of oral hypoglycemic drugs: Secondary | ICD-10-CM | POA: Diagnosis not present

## 2020-03-07 DIAGNOSIS — N200 Calculus of kidney: Secondary | ICD-10-CM | POA: Diagnosis not present

## 2020-03-07 DIAGNOSIS — Z79899 Other long term (current) drug therapy: Secondary | ICD-10-CM | POA: Insufficient documentation

## 2020-03-07 DIAGNOSIS — R109 Unspecified abdominal pain: Secondary | ICD-10-CM | POA: Diagnosis present

## 2020-03-07 LAB — URINALYSIS, COMPLETE (UACMP) WITH MICROSCOPIC
Bacteria, UA: NONE SEEN
Bilirubin Urine: NEGATIVE
Glucose, UA: NEGATIVE mg/dL
Ketones, ur: NEGATIVE mg/dL
Leukocytes,Ua: NEGATIVE
Nitrite: NEGATIVE
Protein, ur: NEGATIVE mg/dL
Specific Gravity, Urine: 1.019 (ref 1.005–1.030)
pH: 5 (ref 5.0–8.0)

## 2020-03-07 LAB — COMPREHENSIVE METABOLIC PANEL
ALT: 21 U/L (ref 0–44)
AST: 18 U/L (ref 15–41)
Albumin: 3.9 g/dL (ref 3.5–5.0)
Alkaline Phosphatase: 72 U/L (ref 38–126)
Anion gap: 9 (ref 5–15)
BUN: 19 mg/dL (ref 8–23)
CO2: 23 mmol/L (ref 22–32)
Calcium: 9.3 mg/dL (ref 8.9–10.3)
Chloride: 103 mmol/L (ref 98–111)
Creatinine, Ser: 1.08 mg/dL (ref 0.61–1.24)
GFR calc Af Amer: >60 mL/min (ref >60)
GFR calc non Af Amer: >60 mL/min (ref >60)
Glucose, Bld: 205 mg/dL — ABNORMAL HIGH (ref 70–99)
Potassium: 4 mmol/L (ref 3.5–5.1)
Sodium: 135 mmol/L (ref 135–145)
Total Bilirubin: 0.9 mg/dL (ref 0.3–1.2)
Total Protein: 7.2 g/dL (ref 6.5–8.1)

## 2020-03-07 LAB — CBC WITH DIFFERENTIAL/PLATELET
Abs Immature Granulocytes: 0.03 10*3/uL (ref 0.00–0.07)
Basophils Absolute: 0.1 10*3/uL (ref 0.0–0.1)
Basophils Relative: 1 %
Eosinophils Absolute: 0.2 10*3/uL (ref 0.0–0.5)
Eosinophils Relative: 2 %
HCT: 44.5 % (ref 39.0–52.0)
Hemoglobin: 15.5 g/dL (ref 13.0–17.0)
Immature Granulocytes: 0 %
Lymphocytes Relative: 29 %
Lymphs Abs: 3.1 10*3/uL (ref 0.7–4.0)
MCH: 31.5 pg (ref 26.0–34.0)
MCHC: 34.8 g/dL (ref 30.0–36.0)
MCV: 90.4 fL (ref 80.0–100.0)
Monocytes Absolute: 1 10*3/uL (ref 0.1–1.0)
Monocytes Relative: 9 %
Neutro Abs: 6.3 10*3/uL (ref 1.7–7.7)
Neutrophils Relative %: 59 %
Platelets: 139 10*3/uL — ABNORMAL LOW (ref 150–400)
RBC: 4.92 MIL/uL (ref 4.22–5.81)
RDW: 11.7 % (ref 11.5–15.5)
WBC: 10.6 10*3/uL — ABNORMAL HIGH (ref 4.0–10.5)
nRBC: 0 % (ref 0.0–0.2)

## 2020-03-07 LAB — LIPASE, BLOOD: Lipase: 37 U/L (ref 11–51)

## 2020-03-07 MED ORDER — OXYCODONE HCL 5 MG PO TABS: 5.0000 mg | ORAL_TABLET | Freq: Four times a day (QID) | ORAL | 0 refills | Status: AC | PRN

## 2020-03-07 MED ORDER — ONDANSETRON HCL 4 MG/2ML IJ SOLN
4.0000 mg | Freq: Once | INTRAMUSCULAR | Status: AC
  Administered 2020-03-07: 08:00:00 4 mg via INTRAVENOUS
  Filled 2020-03-07: qty 2

## 2020-03-07 MED ORDER — TAMSULOSIN HCL 0.4 MG PO CAPS: 0.4000 mg | ORAL_CAPSULE | Freq: Every day | ORAL | 0 refills | Status: AC

## 2020-03-07 MED ORDER — MORPHINE SULFATE (PF) 4 MG/ML IV SOLN
4.0000 mg | Freq: Once | INTRAVENOUS | Status: AC
  Administered 2020-03-07: 08:00:00 4 mg via INTRAVENOUS
  Filled 2020-03-07: qty 1

## 2020-03-07 MED ORDER — KETOROLAC TROMETHAMINE 30 MG/ML IJ SOLN
15.0000 mg | Freq: Once | INTRAMUSCULAR | Status: AC
  Administered 2020-03-07: 15 mg via INTRAVENOUS
  Filled 2020-03-07 (×2): qty 1

## 2020-03-07 MED ORDER — ONDANSETRON HCL 4 MG PO TABS: 4.0000 mg | ORAL_TABLET | Freq: Three times a day (TID) | ORAL | 0 refills | Status: AC | PRN

## 2020-03-07 NOTE — ED Provider Notes (Signed)
[facility] Emergency Department Provider Note  ____________________________________________   First MD Initiated Contact with Patient 03/07/20 [phone]     (approximate)  I have reviewed the triage vital signs and the nursing notes.  History  Chief Complaint Abdominal Pain    HPI Nicolas Zuniga is a 64 y.o. male with history of paroxysmal atrial fibrillation, DM, HLD, HTN who presents to the emergency department for abdominal pain.  Pain started on Tuesday.  Located to the left sided abdomen and lower abdomen. Describes it as a throbbing sensation. Constant since onset. Improved w/ a dose of pain medication he took at home.  Currently 2/10 in severity. Associated w/ hematuria and dysuria.  No fevers, nausea, vomiting.  Feels similar to prior episodes of kidney stones.  Has not required lithotripsy or other procedural intervention in the past.   Past Medical Hx Past Medical History:  Diagnosis Date  . A-fib ([place])   . Diabetes mellitus without complication ([place])   . Hypertension     Problem List Patient Active Problem List   Diagnosis Date Noted  . 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 DIABETES MELLITUS, TYPE II 03/08/2007  . HYPERLIPIDEMIA 03/08/2007  . ANXIETY 03/08/2007  . HYPERTENSION 03/08/2007  . HX, PERSONAL, URINARY CALCULI 03/08/2007    Past Surgical Hx Past Surgical History:  Procedure Laterality Date  . CARDIOVERSION N/A 12/22/2018   Procedure: CARDIOVERSION (CATH LAB);  Surgeon: Corey Skains, MD;  Location: [facility] ORS;  Service: Cardiovascular;  Laterality: N/A;  . ELECTROPHYSIOLOGIC STUDY N/A 07/09/2016   Procedure: CARDIOVERSION;  Surgeon: Dionisio David, MD;  Location: [facility] ORS;  Service: Cardiovascular;  Laterality: N/A;  . TEE WITHOUT CARDIOVERSION N/A 07/09/2016   Procedure: TRANSESOPHAGEAL ECHOCARDIOGRAM (TEE);  Surgeon: Dionisio David, MD;  Location: [facility] ORS;  Service: Cardiovascular;  Laterality: N/A;     Medications Prior to Admission medications   Medication Sig Start Date End Date Taking? Authorizing Provider  apixaban (ELIQUIS) 5 MG TABS tablet Take 5 mg by mouth 2 (two) times daily.    [provider]  flecainide (TAMBOCOR) 150 MG tablet Take 1 tablet (150 mg total) by mouth 2 (two) times daily. 12/22/18   Corey Skains, MD  glipiZIDE (GLUCOTROL XL) 10 MG 24 hr tablet Take 10 mg by mouth 2 (two) times daily.     [provider]  metoprolol (LOPRESSOR) 100 MG tablet Take 100 mg by mouth 2 (two) times daily.    [provider]  Multiple Vitamins-Minerals (MULTIVITAMIN WITH MINERALS) tablet Take 1 tablet by mouth daily with breakfast.     [provider]  ondansetron (ZOFRAN ODT) 4 MG disintegrating tablet Take 1 tablet (4 mg total) by mouth every 8 (eight) hours as needed for nausea or vomiting. Patient not taking: Reported on 12/22/2018 12/27/17   Gregor Hams, MD  Semaglutide (OZEMPIC, 0.25 OR 0.5 MG/DOSE, [place]) Inject 0.25 mg into the skin every Friday.    [provider]    Allergies Patient has no known allergies.  Family Hx No family history on file.  Social Hx Social History   Tobacco Use  . Smoking status: Never Smoker  . Smokeless tobacco: Never Used  Substance Use Topics  . Alcohol use: No  . Drug use: No     Review of Systems  Constitutional: Negative for fever. Negative for chills. Eyes: Negative for visual changes. ENT: Negative for sore throat. Cardiovascular: Negative for chest pain. Respiratory: Negative for shortness of breath. Gastrointestinal:  Negative for nausea. Negative for vomiting.  Positive for abdominal pain. Genitourinary: Positive for dysuria, hematuria. Musculoskeletal: Negative for leg swelling. Skin: Negative for rash. Neurological: Negative for headaches.   Physical Exam  Vital Signs: ED Triage Vitals  Enc Vitals Group     BP 03/07/20 0643 (!) 148/78     Pulse Rate 03/07/20 0643  61     Resp 03/07/20 0643 20     Temp 03/07/20 0643 98.2 F (36.8 C)     Temp Source 03/07/20 0643 Oral     SpO2 03/07/20 0643 97 %     Weight 03/07/20 0642 285 lb (129.3 kg)     Height 03/07/20 0642 6\' 2"  (1.88 m)     Head Circumference --      Peak Flow --      Pain Score 03/07/20 0642 2     Pain Loc --      Pain Edu? --      Excl. in [place]? --     Constitutional: Alert and oriented. Well appearing. NAD.  Head: Normocephalic. Atraumatic. Eyes: Conjunctivae clear. Sclera anicteric. Pupils equal and symmetric. Nose: No masses or lesions. No congestion or rhinorrhea. Mouth/Throat: Wearing mask.  Neck: No stridor. Trachea midline.  Cardiovascular: Normal rate, regular rhythm. Extremities well perfused. Respiratory: Normal respiratory effort.  Lungs CTAB. Gastrointestinal: Soft. Non-distended. Non-tender.  Genitourinary: Deferred. Musculoskeletal: No lower extremity edema. No deformities. Neurologic:  Normal speech and language. No gross focal or lateralizing neurologic deficits are appreciated.  Skin: Skin is warm, dry and intact. No rash noted. Psychiatric: Mood and affect are appropriate for situation.  EKG  N/A    Radiology  CT renal: IMPRESSION:  5 mm obstructing calculus at the left ureterovesical junction with mild hydroureteronephrosis.  3 mm nonobstructing calculus of the left kidney.  Possible minimal sludge or tiny gallstones within the gallbladder.    Procedures  Procedure(s) performed (including critical care):  Procedures   Initial Impression / Assessment and Plan / MDM / ED Course  64 y.o. male who presents to the ED for left-sided abdominal pain, hematuria, dysuria, as above  Ddx: nephroureterolithiasis, UTI, MSK  Will plan for labs, UA, imaging, symptom control and reassess.  Clinical Course as of Mar 07 928  Thu Mar 07, 2020  [id number] Labs reveal normal creatinine.  Normal hemoglobin.  UA with hemoglobin, calcium oxalate crystals, consistent  with stone.  No evidence of infection.   [SM]  [phone] CT with 5 mm obstructing calculus at the left ureterovesical junction with mild hydroureteronephrosis.    [SM]  [id number] Patient's pain is under control, able to tolerate PO.  As such, will proceed with discharge.  Rx for pain, nausea, Flomax provided.  Urology follow-up as needed.  Discussed results and plan of care with patient and wife at bedside.  They voiced understanding and are comfortable with the plan and discharge.  Given return precautions.   [SM]    Clinical Course User Index [SM] Lilia Pro., MD     _______________________________   As part of my medical decision making I have reviewed available labs, radiology tests, reviewed old records.   Final Clinical Impression(s) / ED Diagnosis  Final diagnoses:  Left flank pain  Kidney stone       Note:  This document was prepared using Dragon voice recognition software and may include unintentional dictation errors.   Lilia Pro., MD 03/07/20 0930

## 2020-03-07 NOTE — ED Triage Notes (Signed)
Patient ambulatory to triage with steady gait, without difficulty or distress noted; pt reports since Tuesday having left lower abd pain accomp by hematuria; st hx of kidney stones

## 2020-03-07 NOTE — Discharge Instructions (Addendum)
Thank you for letting us take care of you in the emergency department today. Your work up shows you have a 5 mm kidney stone.   Please continue to take any regular, prescribed medications.   New medications we have prescribed:  Oxycodone, for pain you cannot first control with ibuprofen or Tylenol. This is a narcotic medication, so do not take this with alcohol, do not drive or operate heavy machinery while on it, do not take with other sedating medications Zofran, for nausea as needed Flomax, stay well hydrated while taking this medication  Please follow up with: Urology, as needed in case you are unable to pass the stone by yourself. Information for one is below.   Please return to the ER for any new or worsening symptoms.

## 2020-03-19 ENCOUNTER — Ambulatory Visit: Payer: Self-pay | Admitting: Urology

## 2020-04-30 ENCOUNTER — Other Ambulatory Visit: Payer: Self-pay

## 2020-04-30 ENCOUNTER — Ambulatory Visit (INDEPENDENT_AMBULATORY_CARE_PROVIDER_SITE_OTHER): Payer: BC Managed Care – PPO | Admitting: Vascular Surgery

## 2020-04-30 ENCOUNTER — Encounter (INDEPENDENT_AMBULATORY_CARE_PROVIDER_SITE_OTHER): Payer: Self-pay | Admitting: Vascular Surgery

## 2020-04-30 VITALS — BP 160/90 | HR 66 | Resp 16 | Ht 74.0 in | Wt 305.0 lb

## 2020-04-30 DIAGNOSIS — N200 Calculus of kidney: Secondary | ICD-10-CM | POA: Insufficient documentation

## 2020-04-30 DIAGNOSIS — I8310 Varicose veins of unspecified lower extremity with inflammation: Secondary | ICD-10-CM | POA: Diagnosis not present

## 2020-04-30 DIAGNOSIS — E119 Type 2 diabetes mellitus without complications: Secondary | ICD-10-CM

## 2020-04-30 DIAGNOSIS — I10 Essential (primary) hypertension: Secondary | ICD-10-CM

## 2020-04-30 DIAGNOSIS — I480 Paroxysmal atrial fibrillation: Secondary | ICD-10-CM | POA: Diagnosis not present

## 2020-04-30 NOTE — Patient Instructions (Signed)

## 2020-04-30 NOTE — Progress Notes (Signed)
Patient ID: Nicolas Zuniga, male   DOB: May 18, 1956, 64 y.o.   MRN: [id number]  Chief Complaint  Patient presents with  . New Patient (Initial Visit)    venous stasis    HPI Nicolas Zuniga is a 64 y.o. male.  I am asked to see the patient by Dr. Hoy Morn and Dr. Nehemiah Massed for evaluation of left leg venous insufficiency.  The patient had an episode of cellulitis and infection of the medial left leg several years ago and his mild stasis dermatitis changes that time got much worse.  He has a longstanding history of varicose veins of the left leg with some swelling and mild discoloration that have worsened gradually over the last several years.  It is now fairly prominent.  It is mildly uncomfortable for him in the morning but it does get worse as the day goes on.  He tries to elevate his legs for relief.  He had some mild trauma to that leg playing football in high school but no major surgeries to his knowledge.  He has a history of atrial fibrillation and is on anticoagulation.  No DVT or superficial thrombophlebitis.  No significant right leg symptoms.     Past Medical History:  Diagnosis Date  . A-fib ([place])   . Diabetes mellitus without complication ([place])   . Hypertension     Past Surgical History:  Procedure Laterality Date  . CARDIOVERSION N/A 12/22/2018   Procedure: CARDIOVERSION (CATH LAB);  Surgeon: Corey Skains, MD;  Location: [facility] ORS;  Service: Cardiovascular;  Laterality: N/A;  . ELECTROPHYSIOLOGIC STUDY N/A 07/09/2016   Procedure: CARDIOVERSION;  Surgeon: Dionisio David, MD;  Location: [facility] ORS;  Service: Cardiovascular;  Laterality: N/A;  . TEE WITHOUT CARDIOVERSION N/A 07/09/2016   Procedure: TRANSESOPHAGEAL ECHOCARDIOGRAM (TEE);  Surgeon: Dionisio David, MD;  Location: [facility] ORS;  Service: Cardiovascular;  Laterality: N/A;     Family History Mother had varicose veins with vein stripping many years ago No bleeding disorders, clotting disorders, or aneurysms   Social  History   Tobacco Use  . Smoking status: Never Smoker  . Smokeless tobacco: Never Used  Substance Use Topics  . Alcohol use: No  . Drug use: No    No Known Allergies  Current Outpatient Medications  Medication Sig Dispense Refill  . apixaban (ELIQUIS) 5 MG TABS tablet Take 5 mg by mouth 2 (two) times daily.    . empagliflozin (JARDIANCE) 10 MG TABS tablet Take by mouth.    . flecainide (TAMBOCOR) 150 MG tablet Take 1 tablet (150 mg total) by mouth 2 (two) times daily. 180 tablet 4  . glipiZIDE (GLUCOTROL XL) 10 MG 24 hr tablet Take 10 mg by mouth 2 (two) times daily.     . metoprolol (LOPRESSOR) 100 MG tablet Take 100 mg by mouth 2 (two) times daily.    . Multiple Vitamins-Minerals (MULTIVITAMIN WITH MINERALS) tablet Take 1 tablet by mouth daily with breakfast.     . Semaglutide (OZEMPIC, 0.25 OR 0.5 MG/DOSE, [place]) Inject 0.25 mg into the skin every Friday.    . ondansetron (ZOFRAN ODT) 4 MG disintegrating tablet Take 1 tablet (4 mg total) by mouth every 8 (eight) hours as needed for nausea or vomiting. (Patient not taking: Reported on 12/22/2018) 20 tablet 0   No current facility-administered medications for this visit.      REVIEW OF SYSTEMS (Negative unless checked)  Constitutional: [] Weight loss  [] Fever  [] Chills Cardiac: [] Chest pain   [] Chest  pressure   [x] Palpitations   [] Shortness of breath when laying flat   [] Shortness of breath at rest   [] Shortness of breath with exertion. Vascular:  [] Pain in legs with walking   [] Pain in legs at rest   [] Pain in legs when laying flat   [] Claudication   [] Pain in feet when walking  [] Pain in feet at rest  [] Pain in feet when laying flat   [] History of DVT   [] Phlebitis   [x] Swelling in legs   [x] Varicose veins   [] Non-healing ulcers Pulmonary:   [] Uses home oxygen   [] Productive cough   [] Hemoptysis   [] Wheeze  [] COPD   [] Asthma Neurologic:  [] Dizziness  [] Blackouts   [] Seizures   [] History of stroke   [] History of TIA  [] Aphasia    [] Temporary blindness   [] Dysphagia   [] Weakness or numbness in arms   [] Weakness or numbness in legs Musculoskeletal:  [] Arthritis   [] Joint swelling   [] Joint pain   [] Low back pain Hematologic:  [] Easy bruising  [] Easy bleeding   [] Hypercoagulable state   [] Anemic  [] Hepatitis Gastrointestinal:  [] Blood in stool   [] Vomiting blood  [] Gastroesophageal reflux/heartburn   [] Abdominal pain Genitourinary:  [] Chronic kidney disease   [] Difficult urination  [] Frequent urination  [] Burning with urination   [] Hematuria Skin:  [] Rashes   [] Ulcers   [] Wounds Psychological:  [] History of anxiety   []  History of major depression.    Physical Exam BP (!) 160/90 (BP Location: Right Arm)   Pulse 66   Resp 16   Ht 6\' 2"  (1.88 m)   Wt (!) 305 lb (138.3 kg)   BMI 39.16 kg/m  Gen:  WD/WN, NAD.  Obese Head: [place]/AT, No temporalis wasting.  Ear/Nose/Throat: Hearing grossly intact, nares w/o erythema or drainage, oropharynx w/o Erythema/Exudate Eyes: Conjunctiva clear, sclera non-icteric  Neck: trachea midline.  No JVD.  Pulmonary:  Good air movement, respirations not labored, no use of accessory muscles  Cardiac: RRR, no JVD Vascular: Prominent, 3 to 4 mm varicosities on the left medial leg and calf area Vessel Right Left  Radial Palpable Palpable       Musculoskeletal: M/S 5/5 throughout.  Extremities without ischemic changes.  No deformity or atrophy.  Right leg with only scattered mild varicosities.  Left leg has marked stasis dermatitis changes particularly medially just above the ankle.  1+ left lower extremity edema. Neurologic: Sensation grossly intact in extremities.  Symmetrical.  Speech is fluent. Motor exam as listed above. Psychiatric: Judgment intact, Mood & affect appropriate for pt's clinical situation. Dermatologic: No rashes or ulcers noted.  No cellulitis or open wounds.    Radiology No results found.  Labs Recent Results (from the past 2160 hour(s))  CBC with Differential      Status: Abnormal   Collection Time: 03/07/20  7:03 AM  Result Value Ref Range   WBC 10.6 (H) 4.0 - 10.5 K/uL   RBC 4.92 4.22 - 5.81 MIL/uL   Hemoglobin 15.5 13.0 - 17.0 g/dL   HCT 44.5 39.0 - 52.0 %   MCV 90.4 80.0 - 100.0 fL   MCH 31.5 26.0 - 34.0 pg   MCHC 34.8 30.0 - 36.0 g/dL   RDW 11.7 11.5 - 15.5 %   Platelets 139 (L) 150 - 400 K/uL   nRBC 0.0 0.0 - 0.2 %   Neutrophils Relative % 59 %   Neutro Abs 6.3 1.7 - 7.7 K/uL   Lymphocytes Relative 29 %   Lymphs Abs 3.1 0.7 -  4.0 K/uL   Monocytes Relative 9 %   Monocytes Absolute 1.0 0.1 - 1.0 K/uL   Eosinophils Relative 2 %   Eosinophils Absolute 0.2 0.0 - 0.5 K/uL   Basophils Relative 1 %   Basophils Absolute 0.1 0.0 - 0.1 K/uL   Immature Granulocytes 0 %   Abs Immature Granulocytes 0.03 0.00 - 0.07 K/uL    Comment: Performed at [facility], [address] [place], [place] [address]  Comprehensive metabolic panel     Status: Abnormal   Collection Time: 03/07/20  7:03 AM  Result Value Ref Range   Sodium 135 135 - 145 mmol/L   Potassium 4.0 3.5 - 5.1 mmol/L   Chloride 103 98 - 111 mmol/L   CO2 23 22 - 32 mmol/L   Glucose, Bld 205 (H) 70 - 99 mg/dL    Comment: Glucose reference range applies only to samples taken after fasting for at least 8 hours.   BUN 19 8 - 23 mg/dL   Creatinine, Ser 1.08 0.61 - 1.24 mg/dL   Calcium 9.3 8.9 - 10.3 mg/dL   Total Protein 7.2 6.5 - 8.1 g/dL   Albumin 3.9 3.5 - 5.0 g/dL   AST 18 15 - 41 U/L   ALT 21 0 - 44 U/L   Alkaline Phosphatase 72 38 - 126 U/L   Total Bilirubin 0.9 0.3 - 1.2 mg/dL   GFR calc non Af Amer >60 >60 mL/min   GFR calc Af Amer >60 >60 mL/min   Anion gap 9 5 - 15    Comment: Performed at [facility], [address] [place], [place] [address]  Lipase, blood     Status: None   Collection Time: 03/07/20  7:03 AM  Result Value Ref Range   Lipase 37 11 - 51 U/L    Comment: Performed at [facility], [address] [place], [place] [address]    Urinalysis, Complete w Microscopic     Status: Abnormal   Collection Time: 03/07/20  7:03 AM  Result Value Ref Range   Color, Urine YELLOW (A) YELLOW   APPearance CLEAR (A) CLEAR   Specific Gravity, Urine 1.019 1.005 - 1.030   pH 5.0 5.0 - 8.0   Glucose, UA NEGATIVE NEGATIVE mg/dL   Hgb urine dipstick MODERATE (A) NEGATIVE   Bilirubin Urine NEGATIVE NEGATIVE   Ketones, ur NEGATIVE NEGATIVE mg/dL   Protein, ur NEGATIVE NEGATIVE mg/dL   Nitrite NEGATIVE NEGATIVE   Leukocytes,Ua NEGATIVE NEGATIVE   RBC / HPF 6-10 0 - 5 RBC/hpf   WBC, UA 0-5 0 - 5 WBC/hpf   Bacteria, UA NONE SEEN NONE SEEN   Squamous Epithelial / LPF 0-5 0 - 5   Mucus PRESENT    Ca Oxalate Crys, UA PRESENT     Comment: Performed at [facility], [address] [place], [place] [address]    Assessment/Plan:  Diabetes ([place]) blood glucose control important in reducing the progression of atherosclerotic disease. Also, involved in wound healing. On appropriate medications.   Essential hypertension blood pressure control important in reducing the progression of atherosclerotic disease. On appropriate oral medications.    Paroxysmal A-fib [facility]) Follows with cardiology.  On anticoagulation.  Varicose veins with inflammation  Recommend:  The patient has large symptomatic varicose veins that are painful and associated with swelling.  He has a previous history of cellulitis on the left leg and has marked stasis dermatitis changes.  No significant right leg symptoms currently.  I  have had a long discussion with the patient regarding  varicose veins and why they cause symptoms.  Patient will begin wearing graduated compression stockings class 1 on a daily basis, beginning first thing in the morning and removing them in the evening. The patient is instructed specifically not to sleep in the stockings.    The patient  will also begin using over-the-counter analgesics such as Motrin 600 mg po TID to help control  the symptoms.    In addition, behavioral modification including elevation during the day will be initiated.    Pending the results of these changes the  patient will be reevaluated in three months.   An  ultrasound of the venous system will be obtained on the left leg.   Further plans will be based on the ultrasound results and whether conservative therapies are successful at eliminating the pain and swelling.       Leotis Pain 04/30/2020, 9:21 AM   This note was created with Dragon medical transcription system.  Any errors from dictation are unintentional.

## 2020-04-30 NOTE — Assessment & Plan Note (Signed)
blood pressure control important in reducing the progression of atherosclerotic disease. On appropriate oral medications.  

## 2020-04-30 NOTE — Assessment & Plan Note (Signed)
blood glucose control important in reducing the progression of atherosclerotic disease. Also, involved in wound healing. On appropriate medications.  

## 2020-04-30 NOTE — Assessment & Plan Note (Signed)
Recommend:  The patient has large symptomatic varicose veins that are painful and associated with swelling.  He has a previous history of cellulitis on the left leg and has marked stasis dermatitis changes.  No significant right leg symptoms currently.  I have had a long discussion with the patient regarding  varicose veins and why they cause symptoms.  Patient will begin wearing graduated compression stockings class 1 on a daily basis, beginning first thing in the morning and removing them in the evening. The patient is instructed specifically not to sleep in the stockings.    The patient  will also begin using over-the-counter analgesics such as Motrin 600 mg po TID to help control the symptoms.    In addition, behavioral modification including elevation during the day will be initiated.    Pending the results of these changes the  patient will be reevaluated in three months.   An  ultrasound of the venous system will be obtained on the left leg.   Further plans will be based on the ultrasound results and whether conservative therapies are successful at eliminating the pain and swelling.

## 2020-04-30 NOTE — Assessment & Plan Note (Signed)
Follows with cardiology.  On anticoagulation.

## 2020-07-29 ENCOUNTER — Ambulatory Visit (INDEPENDENT_AMBULATORY_CARE_PROVIDER_SITE_OTHER): Payer: BC Managed Care – PPO | Admitting: Nurse Practitioner

## 2020-07-29 ENCOUNTER — Encounter (INDEPENDENT_AMBULATORY_CARE_PROVIDER_SITE_OTHER): Payer: BC Managed Care – PPO

## 2021-08-08 ENCOUNTER — Other Ambulatory Visit: Payer: Self-pay

## 2021-08-08 ENCOUNTER — Ambulatory Visit (INDEPENDENT_AMBULATORY_CARE_PROVIDER_SITE_OTHER): Payer: Medicare Other

## 2021-08-08 ENCOUNTER — Ambulatory Visit
Admission: EM | Admit: 2021-08-08 | Discharge: 2021-08-08 | Disposition: A | Discharge status: Home / Self Care | Payer: Medicare Other | Attending: Physician Assistant | Admitting: Physician Assistant

## 2021-08-08 ENCOUNTER — Ambulatory Visit (INDEPENDENT_AMBULATORY_CARE_PROVIDER_SITE_OTHER)
Admit: 2021-08-08 | Discharge: 2021-08-08 | Disposition: A | Discharge status: Home / Self Care | Payer: Medicare Other | Attending: Internal Medicine | Admitting: Internal Medicine

## 2021-08-08 DIAGNOSIS — R1011 Right upper quadrant pain: Secondary | ICD-10-CM | POA: Insufficient documentation

## 2021-08-08 DIAGNOSIS — R140 Abdominal distension (gaseous): Secondary | ICD-10-CM | POA: Diagnosis not present

## 2021-08-08 DIAGNOSIS — D72829 Elevated white blood cell count, unspecified: Secondary | ICD-10-CM | POA: Insufficient documentation

## 2021-08-08 DIAGNOSIS — R748 Abnormal levels of other serum enzymes: Secondary | ICD-10-CM | POA: Diagnosis present

## 2021-08-08 DIAGNOSIS — I4891 Unspecified atrial fibrillation: Secondary | ICD-10-CM | POA: Diagnosis present

## 2021-08-08 DIAGNOSIS — R319 Hematuria, unspecified: Secondary | ICD-10-CM

## 2021-08-08 DIAGNOSIS — Z7984 Long term (current) use of oral hypoglycemic drugs: Secondary | ICD-10-CM | POA: Diagnosis not present

## 2021-08-08 DIAGNOSIS — R1900 Intra-abdominal and pelvic swelling, mass and lump, unspecified site: Secondary | ICD-10-CM | POA: Diagnosis present

## 2021-08-08 DIAGNOSIS — E119 Type 2 diabetes mellitus without complications: Secondary | ICD-10-CM | POA: Insufficient documentation

## 2021-08-08 DIAGNOSIS — K8080 Other cholelithiasis without obstruction: Secondary | ICD-10-CM | POA: Insufficient documentation

## 2021-08-08 LAB — COMPREHENSIVE METABOLIC PANEL
ALT: 23 U/L (ref 0–44)
AST: 25 U/L (ref 15–41)
Albumin: 4.3 g/dL (ref 3.5–5.0)
Alkaline Phosphatase: 65 U/L (ref 38–126)
Anion gap: 9 (ref 5–15)
BUN: 15 mg/dL (ref 8–23)
CO2: 22 mmol/L (ref 22–32)
Calcium: 9.5 mg/dL (ref 8.9–10.3)
Chloride: 103 mmol/L (ref 98–111)
Creatinine, Ser: 1.13 mg/dL (ref 0.61–1.24)
GFR, Estimated: >60 mL/min (ref >60)
Glucose, Bld: 164 mg/dL — ABNORMAL HIGH (ref 70–99)
Potassium: 4.3 mmol/L (ref 3.5–5.1)
Sodium: 134 mmol/L — ABNORMAL LOW (ref 135–145)
Total Bilirubin: 1 mg/dL (ref 0.3–1.2)
Total Protein: 8 g/dL (ref 6.5–8.1)

## 2021-08-08 LAB — CBC WITH DIFFERENTIAL/PLATELET
Abs Immature Granulocytes: 0.05 10*3/uL (ref 0.00–0.07)
Basophils Absolute: 0.1 10*3/uL (ref 0.0–0.1)
Basophils Relative: 1 %
Eosinophils Absolute: 0.1 10*3/uL (ref 0.0–0.5)
Eosinophils Relative: 1 %
HCT: 46.1 % (ref 39.0–52.0)
Hemoglobin: 16.4 g/dL (ref 13.0–17.0)
Immature Granulocytes: 0 %
Lymphocytes Relative: 26 %
Lymphs Abs: 3.4 10*3/uL (ref 0.7–4.0)
MCH: 31.7 pg (ref 26.0–34.0)
MCHC: 35.6 g/dL (ref 30.0–36.0)
MCV: 89.2 fL (ref 80.0–100.0)
Monocytes Absolute: 0.9 10*3/uL (ref 0.1–1.0)
Monocytes Relative: 7 %
Neutro Abs: 8.7 10*3/uL — ABNORMAL HIGH (ref 1.7–7.7)
Neutrophils Relative %: 65 %
Platelets: 182 10*3/uL (ref 150–400)
RBC: 5.17 MIL/uL (ref 4.22–5.81)
RDW: 12.2 % (ref 11.5–15.5)
WBC: 13.2 10*3/uL — ABNORMAL HIGH (ref 4.0–10.5)
nRBC: 0 % (ref 0.0–0.2)

## 2021-08-08 LAB — URINALYSIS, COMPLETE (UACMP) WITH MICROSCOPIC
Bacteria, UA: NONE SEEN
Bilirubin Urine: NEGATIVE
Glucose, UA: NEGATIVE mg/dL
Leukocytes,Ua: NEGATIVE
Nitrite: NEGATIVE
Protein, ur: NEGATIVE mg/dL
Specific Gravity, Urine: >1.03 — ABNORMAL HIGH (ref 1.005–1.030)
pH: 5 (ref 5.0–8.0)

## 2021-08-08 LAB — LIPASE, BLOOD: Lipase: 62 U/L — ABNORMAL HIGH (ref 11–51)

## 2021-08-08 LAB — HEMOGLOBIN A1C
Hgb A1c MFr Bld: 7.4 % — ABNORMAL HIGH (ref 4.8–5.6)
Mean Plasma Glucose: 165.68 mg/dL

## 2021-08-08 NOTE — ED Provider Notes (Signed)
[facility]    CSN: [id number] Arrival date & time: 08/08/21  1134      History   Chief Complaint Chief Complaint  Patient presents with   Flank Pain    right    HPI Nicolas Zuniga is a 65 y.o. male presenting for concerns about swelling of the right upper abdomen.  Patient does report recently passing a kidney stone on the right side about 2 weeks ago.  He says he still feels pressure of the right upper abdomen.  He denies any associated pain but says he did not feel much pain when he passed the stone.  He denies any back or flank pain.  No abdominal pain.  No hematuria, dysuria, or frequency urgency.  Denies any decreased urine output.  Reports normal appetite.  Patient states he was constipated a few days ago but the last couple days he had normal BMs.  No black or bloody stools.  No nausea or vomiting.  No fevers.  Patient does have history of A. fib and is currently in A. fib.  He saw his cardiologist 2 days ago and is scheduled to have a cardioversion in 1.5 weeks if he is still in A. fib.  He is anticoagulated with Eliquis.  He is also on flecainide and metoprolol.  He denies any chest pain or shortness of breath.  No palpitations, dizziness, presyncope or syncope.  Patient is also a diabetic and requests an A1c.  States he believes his last A1c was 5 months ago but he does not member what it was.  He is on Jardiance, metformin, Ozempic, and glipizide.  No other complaints.  HPI  Past Medical History:  Diagnosis Date   A-fib ([place])    Diabetes mellitus without complication ([place])    Hypertension     Patient Active Problem List   Diagnosis Date Noted   Diabetes mellitus type 2, uncomplicated (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   Bradycardia 12/30/2018   Paroxysmal A-fib ([place]) 12/14/2018   Non compliance with medical treatment 04/13/2018   History of atrial fibrillation 07/04/2012   EPSTEIN-BARR VIRAL  MONONUCLEOSIS 03/26/2007   Diabetes ([place]) 03/08/2007   HYPERLIPIDEMIA 03/08/2007   ANXIETY 03/08/2007   Essential hypertension 03/08/2007   HX, PERSONAL, URINARY CALCULI 03/08/2007    Past Surgical History:  Procedure Laterality Date   CARDIOVERSION N/A 12/22/2018   Procedure: CARDIOVERSION (CATH LAB);  Surgeon: Corey Skains, MD;  Location: [facility] ORS;  Service: Cardiovascular;  Laterality: N/A;   ELECTROPHYSIOLOGIC STUDY N/A 07/09/2016   Procedure: CARDIOVERSION;  Surgeon: Dionisio David, MD;  Location: [facility] ORS;  Service: Cardiovascular;  Laterality: N/A;   TEE WITHOUT CARDIOVERSION N/A 07/09/2016   Procedure: TRANSESOPHAGEAL ECHOCARDIOGRAM (TEE);  Surgeon: Dionisio David, MD;  Location: [facility] ORS;  Service: Cardiovascular;  Laterality: N/A;       Home Medications    Prior to Admission medications   Medication Sig Start Date End Date Taking? Authorizing Provider  apixaban (ELIQUIS) 5 MG TABS tablet Take 5 mg by mouth 2 (two) times daily.   Yes [provider]  flecainide (TAMBOCOR) 150 MG tablet Take 1 tablet (150 mg total) by mouth 2 (two) times daily. 12/22/18  Yes Corey Skains, MD  glipiZIDE (GLUCOTROL XL) 10 MG 24 hr tablet Take 10 mg by mouth 2 (two) times daily.    Yes [provider]  metFORMIN (GLUCOPHAGE-XR) 500 MG 24 hr tablet Take 1 tablet  by mouth daily. 02/10/21 02/10/22 Yes [provider]  metoprolol (LOPRESSOR) 100 MG tablet Take 100 mg by mouth 2 (two) times daily.   Yes [provider]  Multiple Vitamins-Minerals (MULTIVITAMIN WITH MINERALS) tablet Take 1 tablet by mouth daily with breakfast.    Yes [provider]  Semaglutide (OZEMPIC, 0.25 OR 0.5 MG/DOSE, [place]) Inject 0.25 mg into the skin every Friday.   Yes [provider]  empagliflozin (JARDIANCE) 10 MG TABS tablet Take by mouth. 04/15/20   [provider]  ondansetron (ZOFRAN ODT) 4 MG disintegrating tablet Take 1 tablet (4 mg total) by mouth every 8  (eight) hours as needed for nausea or vomiting. Patient not taking: Reported on 12/22/2018 12/27/17   Gregor Hams, MD    Family History Family History  Problem Relation Age of Onset   Varicose Veins Mother    Heart disease Mother    Diabetes Mother    COPD Father     Social History Social History   Tobacco Use   Smoking status: Never   Smokeless tobacco: Never  Vaping Use   Vaping Use: Never used  Substance Use Topics   Alcohol use: No   Drug use: No     Allergies   Patient has no known allergies.   Review of Systems Review of Systems  Constitutional:  Negative for fatigue and fever.  Respiratory:  Negative for chest tightness and shortness of breath.   Cardiovascular:  Negative for palpitations.  Gastrointestinal:  Positive for abdominal distention. Negative for abdominal pain, blood in stool, constipation, diarrhea, nausea and vomiting.  Genitourinary:  Negative for decreased urine volume, dysuria, flank pain, frequency, hematuria and urgency.  Musculoskeletal:  Negative for back pain.  Neurological:  Negative for dizziness, syncope, weakness and headaches.    Physical Exam Triage Vital Signs ED Triage Vitals  Enc Vitals Group     BP 08/08/21 1247 (!) 150/124     Pulse Rate 08/08/21 1247 87     Resp 08/08/21 1247 18     Temp 08/08/21 1247 98 F (36.7 C)     Temp Source 08/08/21 1247 Oral     SpO2 08/08/21 1247 100 %     Weight 08/08/21 1245 280 lb (127 kg)     Height 08/08/21 1245 '6\' 2"'$  (1.88 m)     Head Circumference --      Peak Flow --      Pain Score 08/08/21 1245 0     Pain Loc --      Pain Edu? --      Excl. in [place]? --    No data found.  Updated Vital Signs BP (!) 150/124   Pulse 87   Temp 98 F (36.7 C) (Oral)   Resp 18   Ht '6\' 2"'$  (1.88 m)   Wt 280 lb (127 kg)   SpO2 100%   BMI 35.95 kg/m      Physical Exam Vitals and nursing note reviewed.  Constitutional:      General: He is not in acute distress.    Appearance: Normal  appearance. He is well-developed. He is not ill-appearing.  HENT:     Head: Normocephalic and atraumatic.  Eyes:     General: No scleral icterus.    Conjunctiva/sclera: Conjunctivae normal.  Cardiovascular:     Rate and Rhythm: Normal rate. Rhythm irregular.     Heart sounds: Normal heart sounds.  Pulmonary:     Effort: Pulmonary effort is normal. No respiratory  distress.     Breath sounds: Normal breath sounds.  Abdominal:     General: Bowel sounds are normal.     Palpations: Abdomen is soft.     Tenderness: There is no abdominal tenderness. There is no right CVA tenderness, left CVA tenderness, guarding or rebound.       Comments: There is mild distension and firmness of area outlined in picture. Non tender  Musculoskeletal:     Cervical back: Neck supple.  Skin:    General: Skin is warm and dry.  Neurological:     General: No focal deficit present.     Mental Status: He is alert. Mental status is at baseline.     Motor: No weakness.     Gait: Gait normal.  Psychiatric:        Mood and Affect: Mood normal.        Behavior: Behavior normal.        Thought Content: Thought content normal.     UC Treatments / Results  Labs (all labs ordered are listed, but only abnormal results are displayed) Labs Reviewed  URINALYSIS, COMPLETE (UACMP) WITH MICROSCOPIC - Abnormal; Notable for the following components:      Result Value   Specific Gravity, Urine >1.030 (*)    Hgb urine dipstick TRACE (*)    Ketones, ur TRACE (*)    All other components within normal limits  CBC WITH DIFFERENTIAL/PLATELET - Abnormal; Notable for the following components:   WBC 13.2 (*)    Neutro Abs 8.7 (*)    All other components within normal limits  COMPREHENSIVE METABOLIC PANEL - Abnormal; Notable for the following components:   Sodium 134 (*)    Glucose, Bld 164 (*)    All other components within normal limits  LIPASE, BLOOD - Abnormal; Notable for the following components:   Lipase 62 (*)     All other components within normal limits  HEMOGLOBIN A1C    EKG   Radiology DG Abdomen 1 View  Result Date: 08/08/2021 CLINICAL DATA:  Hematuria and abdominal bloating. Recent stone passage 2 weeks ago. EXAM: ABDOMEN - 1 VIEW COMPARISON:  03/07/2020 CT scan FINDINGS: Single right and single left calcifications near the expected locations of the ureters appear to represent vascular calcifications on images 77-78 of the prior CT scan of 03/07/2020 enhance I am skeptical that either of these is a ureteral calculus. Density along the left L5-S1 facet joint seems to be related to facet joint arthropathy rather than overlying stone. There is substantial stool in the colon overlying the kidneys and causing heterogeneous levels of density. I do not see a well-defined renal calculus. IMPRESSION: 1. No well-defined urinary tract calculus is identified. CT could be utilized for further characterization if clinically warranted. Electronically Signed   By: Van Clines M.D.   On: 08/08/2021 13:59   US Abdomen Limited RUQ (LIVER/GB)  Result Date: 08/08/2021 CLINICAL DATA:  Right upper quadrant pain and swelling for 2 weeks EXAM: ULTRASOUND ABDOMEN LIMITED RIGHT UPPER QUADRANT COMPARISON:  None. FINDINGS: Gallbladder: Cholelithiasis. No gallbladder wall thickening or pericholecystic fluid. Sonographic Percell Miller sign is negative per technologist. Common bile duct: Diameter: 3 mm Liver: No focal lesion. Diffusely increased parenchymal echogenicity. Portal vein is patent on color Doppler imaging with normal direction of blood flow towards the liver. Other: None. IMPRESSION: 1. Moderate cholelithiasis without additional sonographic evidence of acute cholecystitis. 2. Diffuse increased echogenicity of the hepatic parenchyma is a nonspecific indicator of hepatocellular dysfunction, most commonly steatosis.  Electronically Signed   By: Miachel Roux M.D.   On: 08/08/2021 16:11    Procedures Procedures (including critical  care time)  Medications Ordered in UC Medications - No data to display  Initial Impression / Assessment and Plan / UC Course  I have reviewed the triage vital signs and the nursing notes.  Pertinent labs & imaging results that were available during my care of the patient were reviewed by me and considered in my medical decision making (see chart for details).  65 year old male with history of A. fib, hypertension, hyperlipidemia and type 2 diabetes presenting for swelling and fullness of the right upper quadrant over the past 1 to 2 weeks.  He denies any associated pain.  He does report that he passed a 3 mm kidney stone 2 weeks ago.  Presently he has not had any fevers and denies any back pain, hematuria, or frequency urgency.  No nausea/vomiting or diarrhea.  Patient does report that he is currently in A. fib and his cardiologist, Dr. Serafina Royals is aware.  Patient saw cardiologist on 08/05/2021 and he is scheduled to have a cardioversion on 08/18/2021 if he is still in A. fib.  He is hemodynamically stable and denies any syncope or presyncope, dizziness, fatigue or weakness.  Heart rate controlled at 87 bpm.  On exam he does have some mild swelling and firmness of the right upper quadrant but does not appear to have any tenderness.  No CVA tenderness.  Heart rhythm is irregularly irregular with normal rate.  Chest is clear to auscultation.  UA today shows greater than 1.030 specific gravity, trace hemoglobin, trace ketones.  KUB does not show evidence of renal stone or obstruction.   CBC, CMP, lipase, and A1c ordered.  WBCs elevated at 13.2.  Sodium slightly decreased at 134 and glucose increased at 164.  Patient says he has not eaten anything today and has not taken his diabetes medications.  Lipase slightly elevated at 62.  Hemoglobin A1c pending.  Ordered right upper quadrant ultrasound given that his discomfort is in the right upper quadrant region.  He does not describe it as pain but  says it is uncomfortable.  Ultrasound reviewed.  Ultrasound shows cholelithiasis without evidence of cholecystitis.  Also hepatic steatosis noted.  Patient reports that he already is aware that he has a fatty liver.  I suspect that his gallstones are causing the discomfort.  I placed referral to GI for patient but advised him of the ED precautions relating to abdominal pain as well as ED precautions for A. fib.  He is hemodynamically stable and asymptomatic and has a scheduled appointment with cardiology in 10 days for cardioversion but knows to go to ED sooner for any symptoms.   Final Clinical Impressions(s) / UC Diagnoses   Final diagnoses:  Swelling abdomen  Abdominal pain, RUQ (right upper quadrant)  Leukocytosis, unspecified type  Elevated lipase  Biliary calculus of other site without obstruction  Atrial fibrillation, unspecified type [facility])     Discharge Instructions      Your blood sugar is elevated at 164.  You also have a slightly elevated white blood cell count and lipase level.  I have ordered an ultrasound of your right upper quadrant to assess your liver, gallbladder and pancreas.  The ultrasound shows that you have gallstones and also fatty liver but apparently the fatty liver is not news to you.  I have put in a referral to GI specialist to discuss the gallstones and discomfort  in the right upper quadrant.  If you develop fever, pain, or weakness call 911 or go to emergency department.  Since you are cardioversion is not scheduled for another week and a half, yet to pay close attention to if you are feeling more fatigued or having chest pain/tightness, palpitations/racing heart or feeling faint.  In most cases you need to call 911 or go to the ER.     ED Prescriptions   None    PDMP not reviewed this encounter.   Danton Clap, PA-C 08/08/21 1646

## 2021-08-08 NOTE — Discharge Instructions (Addendum)
Your blood sugar is elevated at 164.  You also have a slightly elevated white blood cell count and lipase level.  I have ordered an ultrasound of your right upper quadrant to assess your liver, gallbladder and pancreas.  The ultrasound shows that you have gallstones and also fatty liver but apparently the fatty liver is not news to you.  I have put in a referral to GI specialist to discuss the gallstones and discomfort in the right upper quadrant.  If you develop fever, pain, or weakness call 911 or go to emergency department.  Since you are cardioversion is not scheduled for another week and a half, yet to pay close attention to if you are feeling more fatigued or having chest pain/tightness, palpitations/racing heart or feeling faint.  In most cases you need to call 911 or go to the ER.

## 2021-08-08 NOTE — ED Triage Notes (Signed)
Pt c/o recent kidney stone, right side. Pt states he did pass a 27m stone about 2 weeks ago. Pt states he has continued pressure and pain, reports some abdominal swelling. Pt also states he has A-Fib, saw card 2 days ago.

## 2021-08-18 ENCOUNTER — Ambulatory Visit
Admission: RE | Admit: 2021-08-18 | Discharge: 2021-08-18 | Disposition: A | Discharge status: Home / Self Care | Payer: Medicare Other | Attending: Internal Medicine | Admitting: Internal Medicine

## 2021-08-18 ENCOUNTER — Encounter
Admission: RE | Disposition: A | Discharge status: Home / Self Care | Payer: Self-pay | Source: Home / Self Care | Attending: Internal Medicine

## 2021-08-18 ENCOUNTER — Encounter: Payer: Self-pay | Admitting: Internal Medicine

## 2021-08-18 ENCOUNTER — Ambulatory Visit: Payer: Medicare Other | Admitting: Certified Registered Nurse Anesthetist

## 2021-08-18 DIAGNOSIS — E119 Type 2 diabetes mellitus without complications: Secondary | ICD-10-CM | POA: Insufficient documentation

## 2021-08-18 DIAGNOSIS — Z7984 Long term (current) use of oral hypoglycemic drugs: Secondary | ICD-10-CM | POA: Insufficient documentation

## 2021-08-18 DIAGNOSIS — Z7901 Long term (current) use of anticoagulants: Secondary | ICD-10-CM | POA: Diagnosis not present

## 2021-08-18 DIAGNOSIS — I480 Paroxysmal atrial fibrillation: Secondary | ICD-10-CM | POA: Diagnosis present

## 2021-08-18 DIAGNOSIS — Z794 Long term (current) use of insulin: Secondary | ICD-10-CM | POA: Diagnosis not present

## 2021-08-18 DIAGNOSIS — I10 Essential (primary) hypertension: Secondary | ICD-10-CM | POA: Insufficient documentation

## 2021-08-18 DIAGNOSIS — Z8249 Family history of ischemic heart disease and other diseases of the circulatory system: Secondary | ICD-10-CM | POA: Insufficient documentation

## 2021-08-18 DIAGNOSIS — Z833 Family history of diabetes mellitus: Secondary | ICD-10-CM | POA: Diagnosis not present

## 2021-08-18 DIAGNOSIS — E782 Mixed hyperlipidemia: Secondary | ICD-10-CM | POA: Insufficient documentation

## 2021-08-18 DIAGNOSIS — Z79899 Other long term (current) drug therapy: Secondary | ICD-10-CM | POA: Insufficient documentation

## 2021-08-18 HISTORY — PX: CARDIOVERSION: SHX1299

## 2021-08-18 LAB — GLUCOSE, CAPILLARY: Glucose-Capillary: 137 mg/dL — ABNORMAL HIGH (ref 70–99)

## 2021-08-18 SURGERY — CARDIOVERSION
Anesthesia: General

## 2021-08-18 MED ORDER — PROPOFOL 10 MG/ML IV BOLUS
INTRAVENOUS | Status: AC
  Filled 2021-08-18: qty 20

## 2021-08-18 MED ORDER — SODIUM CHLORIDE 0.9 % IV SOLN: INTRAVENOUS | Status: DC | PRN

## 2021-08-18 MED ORDER — PROPOFOL 10 MG/ML IV BOLUS
INTRAVENOUS | Status: DC | PRN
  Administered 2021-08-18: 70 mg via INTRAVENOUS
  Administered 2021-08-18: 30 mg via INTRAVENOUS

## 2021-08-18 MED ORDER — FENTANYL CITRATE (PF) 100 MCG/2ML IJ SOLN: 25.0000 ug | INTRAMUSCULAR | Status: DC | PRN

## 2021-08-18 MED ORDER — SODIUM CHLORIDE 0.9 % IV SOLN
Freq: Once | INTRAVENOUS | Status: AC
  Administered 2021-08-18: 1000 mL via INTRAVENOUS

## 2021-08-18 MED ORDER — ONDANSETRON HCL 4 MG/2ML IJ SOLN: 4.0000 mg | Freq: Once | INTRAMUSCULAR | Status: DC | PRN

## 2021-08-18 NOTE — Anesthesia Procedure Notes (Signed)
Date/Time: 08/18/2021 7:36 AM Performed by: Lily Peer, Nirvana Blanchett, CRNA Pre-anesthesia Checklist: Patient identified, Emergency Drugs available, Suction available, Patient being monitored and Timeout performed Patient Re-evaluated:Patient Re-evaluated prior to induction Oxygen Delivery Method: Nasal cannula Induction Type: IV induction

## 2021-08-18 NOTE — Anesthesia Postprocedure Evaluation (Signed)
Anesthesia Post Note  Patient: Nicolas Zuniga  Procedure(s) Performed: CARDIOVERSION  Patient location during evaluation: Other (CV unit) Anesthesia Type: General Level of consciousness: awake and alert Pain management: pain level controlled Vital Signs Assessment: post-procedure vital signs reviewed and stable Respiratory status: spontaneous breathing, nonlabored ventilation and respiratory function stable Cardiovascular status: blood pressure returned to baseline and stable Postop Assessment: no apparent nausea or vomiting Anesthetic complications: no   No notable events documented.   Last Vitals:  Vitals:   08/18/21 0745 08/18/21 0800  BP:  (!) 124/93  Pulse: 65 65  Resp:    Temp:    SpO2: 97% 98%    Last Pain:  Vitals:   08/18/21 0800  PainSc: 0-No pain                 Iran Ouch

## 2021-08-18 NOTE — CV Procedure (Signed)
Electrical Cardioversion Procedure Note Nicolas Zuniga [id number] February 18, 1956  Procedure: Electrical Cardioversion Indications:  Paroxysmal non valvular atrial fibrillation  Procedure Details Consent: Risks of procedure as well as the alternatives and risks of each were explained to the (patient/caregiver).  Consent for procedure obtained. Time Out: Verified patient identification, verified procedure, site/side was marked, verified correct patient position, special equipment/implants available, medications/allergies/relevent history reviewed, required imaging and test results available.  Performed  Patient placed on cardiac monitor, pulse oximetry, supplemental oxygen as necessary.  Sedation given: Propofol and versed as per anesthesia  Pacer pads placed anterior and posterior chest.  Cardioverted 1 time(s).  Cardioverted at 120J.  Evaluation Findings: Post procedure EKG shows: NSR Complications: None Patient did  tolerate procedure well.   Nicolas Zuniga M.D. [facility] 08/18/2021, 7:42 AM

## 2021-08-18 NOTE — Transfer of Care (Signed)
Immediate Anesthesia Transfer of Care Note  Patient: Nicolas Zuniga  Procedure(s) Performed: CARDIOVERSION  Patient Location: Cath Lab  Anesthesia Type:General  Level of Consciousness: awake, alert  and oriented  Airway & Oxygen Therapy: Patient Spontanous Breathing and Patient connected to nasal cannula oxygen  Post-op Assessment: Report given to RN and Post -op Vital signs reviewed and stable  Post vital signs: Reviewed and stable  Last Vitals:  Vitals Value Taken Time  BP 127/75 08/18/21 0742  Temp    Pulse 65 08/18/21 0745  Resp 12   SpO2 97 % 08/18/21 0745    Last Pain:  Vitals:   08/18/21 0652  PainSc: 0-No pain         Complications: No notable events documented.

## 2021-08-18 NOTE — Anesthesia Preprocedure Evaluation (Signed)
Anesthesia Evaluation  Patient identified by MRN, date of birth, ID band Patient awake    Reviewed: Allergy & Precautions, H&P , NPO status , Patient's Chart, lab work & pertinent test results, reviewed documented beta blocker date and time   Airway Mallampati: III  TM Distance: >3 FB Neck ROM: full    Dental no notable dental hx. (+) Teeth Intact   Pulmonary neg pulmonary ROS, Current Smoker,    Pulmonary exam normal breath sounds clear to auscultation       Cardiovascular Exercise Tolerance: Good hypertension, On Medications negative cardio ROS  Atrial Fibrillation  Rhythm:regular Rate:Normal     Neuro/Psych Anxiety negative neurological ROS  negative psych ROS   GI/Hepatic negative GI ROS, Neg liver ROS,   Endo/Other  negative endocrine ROSdiabetes  Renal/GU Renal diseasenegative Renal ROS  negative genitourinary   Musculoskeletal   Abdominal   Peds  Hematology negative hematology ROS (+)   Anesthesia Other Findings Past Medical History: No date: A-fib ([facility]) No date: Diabetes mellitus without complication ([facility]) No date: Hypertension Past Surgical History: 12/22/2018: CARDIOVERSION; N/A     Comment:  Procedure: CARDIOVERSION (CATH LAB);  Surgeon: Corey Skains, MD;  Location: [facility] ORS;  Service:               Cardiovascular;  Laterality: N/A; 07/09/2016: ELECTROPHYSIOLOGIC STUDY; N/A     Comment:  Procedure: CARDIOVERSION;  Surgeon: Dionisio David, MD;               Location: [facility] ORS;  Service: Cardiovascular;                Laterality: N/A; 07/09/2016: TEE WITHOUT CARDIOVERSION; N/A     Comment:  Procedure: TRANSESOPHAGEAL ECHOCARDIOGRAM (TEE);                Surgeon: Dionisio David, MD;  Location: [facility] ORS;                Service: Cardiovascular;  Laterality: N/A;   Reproductive/Obstetrics negative OB ROS (+) Pregnancy                             Anesthesia  Physical Anesthesia Plan  ASA: 4  Anesthesia Plan: General   Post-op Pain Management:    Induction:   PONV Risk Score and Plan:   Airway Management Planned:   Additional Equipment:   Intra-op Plan:   Post-operative Plan:   Informed Consent: I have reviewed the patients History and Physical, chart, labs and discussed the procedure including the risks, benefits and alternatives for the proposed anesthesia with the patient or authorized representative who has indicated his/her understanding and acceptance.     Dental Advisory Given  Plan Discussed with: CRNA  Anesthesia Plan Comments:         Anesthesia Quick Evaluation

## 2021-08-19 NOTE — Addendum Note (Signed)
Addendum  created 08/19/21 0727 by Iran Ouch, MD   Review and Sign - Ready for Procedure

## 2021-10-06 ENCOUNTER — Ambulatory Visit (INDEPENDENT_AMBULATORY_CARE_PROVIDER_SITE_OTHER): Payer: Medicare Other | Admitting: Gastroenterology

## 2021-10-06 ENCOUNTER — Other Ambulatory Visit: Payer: Self-pay

## 2021-10-06 ENCOUNTER — Encounter: Payer: Self-pay | Admitting: Gastroenterology

## 2021-10-06 VITALS — BP 135/76 | HR 66 | Temp 97.2°F | Ht 74.0 in | Wt 288.8 lb

## 2021-10-06 DIAGNOSIS — R1011 Right upper quadrant pain: Secondary | ICD-10-CM

## 2021-10-06 DIAGNOSIS — R1084 Generalized abdominal pain: Secondary | ICD-10-CM

## 2021-10-06 DIAGNOSIS — R194 Change in bowel habit: Secondary | ICD-10-CM

## 2021-10-06 DIAGNOSIS — R140 Abdominal distension (gaseous): Secondary | ICD-10-CM | POA: Diagnosis not present

## 2021-10-06 NOTE — Progress Notes (Signed)
Gastroenterology Consultation  Referring Provider:     Gretta Cool Primary Care Physician:  Hortencia Pilar, MD Primary Gastroenterologist:  Dr. Allen Norris     Reason for Consultation:     Right upper quadrant pain        HPI:   Nicolas Zuniga is a 65 y.o. y/o male referred for consultation & management of right upper quadrant pain by Dr. Hortencia Pilar, MD. Thispatient comes in today with a report of right upper quadrant pain.  The patient was sent for a CT scan back at the beginning of September that showed:  IMPRESSION: 1. Moderate cholelithiasis without additional sonographic evidence of acute cholecystitis. 2. Diffuse increased echogenicity of the hepatic parenchyma is a nonspecific indicator of hepatocellular dysfunction, most commonly steatosis.   The patient had reported to have bloating and hematuria and a renal stone was seen on the KUB.  The patient reports that he moves his bowels every day to every other day but also reports that he has not had any unexplained weight loss fevers chills nausea or vomiting.  The patient also reports that he has never had a colonoscopy in the past. He has modified his diet which has helped some of his discomfort but he continues to have bloating.  The patient denies any family history of colon cancer colon polyps.  He does think that possibly oily greasy foods make his symptoms worse. The patient's liver enzymes most recently in September were normal.  While the lipase was slightly elevated at 62 with the upper limit of normal being 51.  Past Medical History:  Diagnosis Date   A-fib [facility])    Diabetes mellitus without complication ([place])    Hypertension     Past Surgical History:  Procedure Laterality Date   CARDIOVERSION N/A 12/22/2018   Procedure: CARDIOVERSION (CATH LAB);  Surgeon: Corey Skains, MD;  Location: [facility] ORS;  Service: Cardiovascular;  Laterality: N/A;   CARDIOVERSION N/A 08/18/2021   Procedure: CARDIOVERSION;   Surgeon: Corey Skains, MD;  Location: [facility] ORS;  Service: Cardiovascular;  Laterality: N/A;   ELECTROPHYSIOLOGIC STUDY N/A 07/09/2016   Procedure: CARDIOVERSION;  Surgeon: Dionisio David, MD;  Location: [facility] ORS;  Service: Cardiovascular;  Laterality: N/A;   TEE WITHOUT CARDIOVERSION N/A 07/09/2016   Procedure: TRANSESOPHAGEAL ECHOCARDIOGRAM (TEE);  Surgeon: Dionisio David, MD;  Location: [facility] ORS;  Service: Cardiovascular;  Laterality: N/A;    Prior to Admission medications   Medication Sig Start Date End Date Taking? Authorizing Provider  apixaban (ELIQUIS) 5 MG TABS tablet Take 5 mg by mouth 2 (two) times daily.    [provider]  benzonatate (TESSALON) 200 MG capsule Take by mouth. 09/15/21   [provider]  cholecalciferol (VITAMIN D3) 25 MCG (1000 UNIT) tablet Take 1,000 Units by mouth daily.    [provider]  Coenzyme Q10 (COQ10) 100 MG CAPS Take 100 mg by mouth daily.    [provider]  flecainide (TAMBOCOR) 150 MG tablet Take 1 tablet (150 mg total) by mouth 2 (two) times daily. 12/22/18   Corey Skains, MD  glipiZIDE (GLUCOTROL XL) 10 MG 24 hr tablet Take 10 mg by mouth 2 (two) times daily.     [provider]  LAGEVRIO 200 MG CAPS capsule Take by mouth. 09/15/21   [provider]  metFORMIN (GLUCOPHAGE-XR) 500 MG 24 hr tablet Take 500 mg by mouth daily with breakfast. 02/10/21   [provider]  metoprolol (LOPRESSOR) 100  MG tablet Take 100 mg by mouth 2 (two) times daily.    [provider]  Multiple Vitamins-Minerals (MULTIVITAMIN WITH MINERALS) tablet Take 1 tablet by mouth daily with breakfast.     [provider]  Omega-3 Fatty Acids (FISH OIL) 1000 MG CAPS Take 1,000 mg by mouth daily.    [provider]  OZEMPIC, 1 MG/DOSE, 4 MG/3ML SOPN Inject 1 mg into the skin every Wednesday. 08/02/21   [provider]    Family History  Problem Relation Age of Onset   Varicose Veins  Mother    Heart disease Mother    Diabetes Mother    COPD Father      Social History   Tobacco Use   Smoking status: Never   Smokeless tobacco: Never  Vaping Use   Vaping Use: Never used  Substance Use Topics   Alcohol use: No   Drug use: No    Allergies as of 10/06/2021   (No Known Allergies)    Review of Systems:    All systems reviewed and negative except where noted in HPI.   Physical Exam:  There were no vitals taken for this visit. No LMP for male patient. General:   Alert,  Well-developed, well-nourished, pleasant and cooperative in NAD Head:  Normocephalic and atraumatic. Eyes:  Sclera clear, no icterus.   Conjunctiva pink. Ears:  Normal auditory acuity. Neck:  Supple; no masses or thyromegaly. Lungs:  Respirations even and unlabored.  Clear throughout to auscultation.   No wheezes, crackles, or rhonchi. No acute distress. Heart:  Regular rate and rhythm; no murmurs, clicks, rubs, or gallops. Abdomen:  Normal bowel sounds.  No bruits.  Soft, Mild tenderness on the flanks and non-distended without masses, hepatosplenomegaly or hernias noted.  No guarding or rebound tenderness.  Negative Carnett sign.   Rectal:  Deferred.  Pulses:  Normal pulses noted. Extremities:  No clubbing or edema.  No cyanosis. Neurologic:  Alert and oriented x3;  grossly normal neurologically. Skin:  Intact without significant lesions or rashes.  No jaundice. Lymph Nodes:  No significant cervical adenopathy. Psych:  Alert and cooperative. Normal mood and affect.  Imaging Studies: No results found.  Assessment and Plan:   Nicolas Zuniga is a 65 y.o. y/o male with a history of right upper quadrant pain and bloating with stone seen on imaging in his gallbladder.  The patient has had a change in bowel habits and therefore will be set up for a colonoscopy. The patient will also be set up for a gallbladder emptying study to rule out any gallbladder dysfunction.  The patient has been explained  the plan and agrees with it.    Lucilla Lame, MD. Marval Regal    Note: This dictation was prepared with Dragon dictation along with smaller phrase technology. Any transcriptional errors that result from this process are unintentional.

## 2021-10-20 ENCOUNTER — Other Ambulatory Visit: Payer: Self-pay

## 2021-10-20 ENCOUNTER — Ambulatory Visit
Admission: RE | Admit: 2021-10-20 | Discharge: 2021-10-20 | Disposition: A | Discharge status: Home / Self Care | Payer: Medicare Other | Source: Ambulatory Visit | Attending: Gastroenterology | Admitting: Gastroenterology

## 2021-10-20 DIAGNOSIS — R1011 Right upper quadrant pain: Secondary | ICD-10-CM | POA: Insufficient documentation

## 2021-10-20 MED ORDER — TECHNETIUM TC 99M MEBROFENIN IV KIT
5.0000 | PACK | Freq: Once | INTRAVENOUS | Status: AC | PRN
  Administered 2021-10-20: 5.04 via INTRAVENOUS

## 2021-10-27 ENCOUNTER — Telehealth: Payer: Self-pay

## 2021-10-27 NOTE — Telephone Encounter (Signed)
-----   Message from Lucilla Lame, MD sent at 10/21/2021  5:30 PM EST ----- Let the patient know that his gallbladder emptying study was normal at 55% with 33% and above being normal.

## 2021-10-27 NOTE — Telephone Encounter (Signed)
Pt notified of HIDA scan results via Mychart.

## 2022-03-11 ENCOUNTER — Other Ambulatory Visit: Payer: Self-pay

## 2022-03-11 ENCOUNTER — Ambulatory Visit: Payer: Medicare Other | Admitting: Anesthesiology

## 2022-03-11 ENCOUNTER — Ambulatory Visit
Admission: RE | Admit: 2022-03-11 | Discharge: 2022-03-11 | Disposition: A | Discharge status: Home / Self Care | Payer: Medicare Other | Attending: Internal Medicine | Admitting: Internal Medicine

## 2022-03-11 ENCOUNTER — Encounter
Admission: RE | Disposition: A | Discharge status: Home / Self Care | Payer: Self-pay | Source: Home / Self Care | Attending: Internal Medicine

## 2022-03-11 ENCOUNTER — Encounter: Payer: Self-pay | Admitting: Internal Medicine

## 2022-03-11 DIAGNOSIS — Z7901 Long term (current) use of anticoagulants: Secondary | ICD-10-CM | POA: Insufficient documentation

## 2022-03-11 DIAGNOSIS — I10 Essential (primary) hypertension: Secondary | ICD-10-CM | POA: Diagnosis not present

## 2022-03-11 DIAGNOSIS — Z6837 Body mass index (BMI) 37.0-37.9, adult: Secondary | ICD-10-CM | POA: Insufficient documentation

## 2022-03-11 DIAGNOSIS — E669 Obesity, unspecified: Secondary | ICD-10-CM | POA: Insufficient documentation

## 2022-03-11 DIAGNOSIS — E119 Type 2 diabetes mellitus without complications: Secondary | ICD-10-CM | POA: Insufficient documentation

## 2022-03-11 DIAGNOSIS — I480 Paroxysmal atrial fibrillation: Secondary | ICD-10-CM | POA: Insufficient documentation

## 2022-03-11 DIAGNOSIS — Z8679 Personal history of other diseases of the circulatory system: Secondary | ICD-10-CM

## 2022-03-11 HISTORY — PX: CARDIOVERSION: SHX1299

## 2022-03-11 LAB — GLUCOSE, CAPILLARY: Glucose-Capillary: 130 mg/dL — ABNORMAL HIGH (ref 70–99)

## 2022-03-11 SURGERY — CARDIOVERSION
Anesthesia: General

## 2022-03-11 MED ORDER — ACETAMINOPHEN 160 MG/5ML PO SOLN
325.0000 mg | ORAL | Status: DC | PRN
  Filled 2022-03-11: qty 20.3

## 2022-03-11 MED ORDER — PROPOFOL 10 MG/ML IV BOLUS
INTRAVENOUS | Status: DC | PRN
  Administered 2022-03-11: 70 mg via INTRAVENOUS

## 2022-03-11 MED ORDER — SODIUM CHLORIDE 0.9 % IV SOLN: INTRAVENOUS | Status: DC

## 2022-03-11 MED ORDER — ACETAMINOPHEN 325 MG PO TABS: 650.0000 mg | ORAL_TABLET | Freq: Once | ORAL | Status: DC | PRN

## 2022-03-11 MED ORDER — PROPOFOL 500 MG/50ML IV EMUL
INTRAVENOUS | Status: AC
  Filled 2022-03-11: qty 50

## 2022-03-11 MED ORDER — ONDANSETRON HCL 4 MG/2ML IJ SOLN: 4.0000 mg | Freq: Once | INTRAMUSCULAR | Status: DC | PRN

## 2022-03-11 NOTE — Anesthesia Preprocedure Evaluation (Addendum)
Anesthesia Evaluation  ?Patient identified by MRN, date of birth, ID band ?Patient awake ? ? ? ?Reviewed: ?Allergy & Precautions, NPO status , Patient's Chart, lab work & pertinent test results ? ?History of Anesthesia Complications ?Negative for: history of anesthetic complications ? ?Airway ?Mallampati: III ? ? ?Neck ROM: Full ? ? ? Dental ?no notable dental hx. ? ?  ?Pulmonary ?neg pulmonary ROS,  ?  ?Pulmonary exam normal ?breath sounds clear to auscultation ? ? ? ? ? ? Cardiovascular ?hypertension, + dysrhythmias (a fib on Eliquis)  ?Rhythm:Irregular Rate:Normal ? ?ECG 02/24/22:  ?Atrial fibrillation  ?Nonspecific intraventricular block  ?Cannot rule out Anterior infarct , age undetermined ?  ?Neuro/Psych ?PSYCHIATRIC DISORDERS Anxiety negative neurological ROS ?   ? GI/Hepatic ?negative GI ROS,   ?Endo/Other  ?diabetes, Type 2Obesity  ? Renal/GU ?negative Renal ROS  ? ?  ?Musculoskeletal ? ? Abdominal ?  ?Peds ? Hematology ?negative hematology ROS ?(+)   ?Anesthesia Other Findings ?Cardiology note 02/24/22:  ?Plan  ?-Proceed to electrical cardioversion of atrial fibrillation for improvements of symptoms, risk reduction in stroke, and reduced development of congestive heart failure. Patient understands risk and benefits of electrical cardioversion to normal sinus rhythm. This includes the possibility of death, stroke, heart attack, further rhythm disturbances, and reaction to medication management. Patient is at low risk for general anesthesia. ?-apixaban for anticoagulation and further risk reduction of stroke with atrial fibrillation/flutter. Patient understands all risks and benefits of this medication management including the possibility of side effects including bleeding, bruising, stroke, and interactions with other medication management. The patient understands that we are following current guideline therapy for risk reduction. We will follow closely for any side effects  and/or issues listed above. ?-It has been stressed to the patient that the 4 most important factors of a healthy lifestyle with reduce cardiovascular implications includes abstinence of tobacco use, regular physical activity including the possibility of an exercise prescription, healthy diet, and maintaining an appropriate body mass index. The patient will consider these approaches in the future. ? ? Reproductive/Obstetrics ? ?  ? ? ? ? ? ? ? ? ? ? ? ? ? ?  ?  ? ? ? ? ? ? ? ?Anesthesia Physical ?Anesthesia Plan ? ?ASA: 3 ? ?Anesthesia Plan: General  ? ?Post-op Pain Management:   ? ?Induction: Intravenous ? ?PONV Risk Score and Plan: 2 and Propofol infusion, TIVA and Treatment may vary due to age or medical condition ? ?Airway Management Planned: Natural Airway ? ?Additional Equipment:  ? ?Intra-op Plan:  ? ?Post-operative Plan:  ? ?Informed Consent: I have reviewed the patients History and Physical, chart, labs and discussed the procedure including the risks, benefits and alternatives for the proposed anesthesia with the patient or authorized representative who has indicated his/her understanding and acceptance.  ? ? ? ? ? ?Plan Discussed with: CRNA ? ?Anesthesia Plan Comments: (LMA/GETA backup discussed.  Patient consented for risks of anesthesia including but not limited to:  ?- adverse reactions to medications ?- damage to eyes, teeth, lips or other oral mucosa ?- nerve damage due to positioning  ?- sore throat or hoarseness ?- damage to heart, brain, nerves, lungs, other parts of body or loss of life ? ?Informed patient about role of CRNA in peri- and intra-operative care.  Patient voiced understanding.)  ? ? ? ? ? ? ?Anesthesia Quick Evaluation ? ?

## 2022-03-11 NOTE — Anesthesia Postprocedure Evaluation (Signed)
Anesthesia Post Note ? ?Patient: Nicolas Zuniga ? ?Procedure(s) Performed: CARDIOVERSION ? ?Patient location during evaluation: PACU ?Anesthesia Type: General ?Level of consciousness: awake and alert, oriented and patient cooperative ?Pain management: pain level controlled ?Vital Signs Assessment: post-procedure vital signs reviewed and stable ?Respiratory status: spontaneous breathing, nonlabored ventilation and respiratory function stable ?Cardiovascular status: blood pressure returned to baseline and stable ?Postop Assessment: adequate PO intake ?Anesthetic complications: no ? ? ?No notable events documented. ? ? ?Last Vitals:  ?Vitals:  ? 03/11/22 0800 03/11/22 0815  ?BP: 108/69 (!) 133/99  ?Pulse: 74 71  ?Resp: 19 15  ?Temp:    ?SpO2: 99% 97%  ?  ?Last Pain:  ?Vitals:  ? 03/11/22 0733  ?TempSrc: Oral  ?PainSc: 0-No pain  ? ? ?  ?  ?  ?  ?  ?  ? ?Darrin Nipper ? ? ? ? ?

## 2022-03-11 NOTE — Transfer of Care (Signed)
Immediate Anesthesia Transfer of Care Note ? ?Patient: Nicolas Zuniga ? ?Procedure(s) Performed: CARDIOVERSION ? ?Patient Location: cardioversion unit ? ?Anesthesia Type:General ? ?Level of Consciousness: awake ? ?Airway & Oxygen Therapy: Patient Spontanous Breathing and Patient connected to nasal cannula oxygen ? ?Post-op Assessment: Report given to RN and Post -op Vital signs reviewed and stable ? ?Post vital signs: Reviewed and stable ? ?Last Vitals:  ?Vitals Value Taken Time  ?BP 135/86 03/11/22 0755  ?Temp    ?Pulse 67 03/11/22 0800  ?Resp 20 03/11/22 0800  ?SpO2 98 % 03/11/22 0800  ?Vitals shown include unvalidated device data. ? ?Last Pain:  ?Vitals:  ? 03/11/22 0733  ?TempSrc: Oral  ?PainSc: 0-No pain  ?   ? ?  ? ?Complications: No notable events documented. ?

## 2022-03-11 NOTE — CV Procedure (Signed)
Electrical Cardioversion Procedure Note ?Stevphen Rochester ?[id number] ?[birth year] ? ?Procedure: Electrical Cardioversion ?Indications:  Paroxysmal non valvular atrial fibrillation ? ?Procedure Details ?Consent: Risks of procedure as well as the alternatives and risks of each were explained to the (patient/caregiver).  Consent for procedure obtained. ?Time Out: Verified patient identification, verified procedure, site/side was marked, verified correct patient position, special equipment/implants available, medications/allergies/relevent history reviewed, required imaging and test results available.  Performed ? ?Patient placed on cardiac monitor, pulse oximetry, supplemental oxygen as necessary.  ?Sedation given: Propofol and versed as per anesthesia  ?Pacer pads placed anterior and posterior chest. ? ?Cardioverted 1 time(s).  ?Cardioverted at 120J. ? ?Evaluation ?Findings: Post procedure EKG shows: NSR ?Complications: None ?Patient did  tolerate procedure well. ? ? ?Serafina Royals M.D. FACC ?03/11/2022, 8:03 AM ? ? ? ?

## 2022-03-30 ENCOUNTER — Encounter: Payer: Self-pay | Admitting: Internal Medicine

## 2022-03-30 MED ORDER — SODIUM CHLORIDE 0.9 % IV SOLN: INTRAVENOUS | Status: DC

## 2022-03-31 ENCOUNTER — Other Ambulatory Visit: Payer: Self-pay

## 2022-03-31 ENCOUNTER — Encounter
Admission: RE | Disposition: A | Discharge status: Home / Self Care | Payer: Self-pay | Source: Home / Self Care | Attending: Internal Medicine

## 2022-03-31 ENCOUNTER — Encounter: Payer: Self-pay | Admitting: Internal Medicine

## 2022-03-31 ENCOUNTER — Ambulatory Visit: Payer: Medicare Other | Admitting: Certified Registered Nurse Anesthetist

## 2022-03-31 ENCOUNTER — Ambulatory Visit
Admission: RE | Admit: 2022-03-31 | Discharge: 2022-03-31 | Disposition: A | Discharge status: Home / Self Care | Payer: Medicare Other | Attending: Internal Medicine | Admitting: Internal Medicine

## 2022-03-31 DIAGNOSIS — Z79899 Other long term (current) drug therapy: Secondary | ICD-10-CM | POA: Diagnosis not present

## 2022-03-31 DIAGNOSIS — E119 Type 2 diabetes mellitus without complications: Secondary | ICD-10-CM | POA: Diagnosis not present

## 2022-03-31 DIAGNOSIS — Z7901 Long term (current) use of anticoagulants: Secondary | ICD-10-CM | POA: Diagnosis not present

## 2022-03-31 DIAGNOSIS — I480 Paroxysmal atrial fibrillation: Secondary | ICD-10-CM | POA: Diagnosis present

## 2022-03-31 DIAGNOSIS — I10 Essential (primary) hypertension: Secondary | ICD-10-CM | POA: Insufficient documentation

## 2022-03-31 HISTORY — PX: CARDIOVERSION: SHX1299

## 2022-03-31 LAB — GLUCOSE, CAPILLARY: Glucose-Capillary: 125 mg/dL — ABNORMAL HIGH (ref 70–99)

## 2022-03-31 SURGERY — CARDIOVERSION
Anesthesia: General

## 2022-03-31 MED ORDER — PROPOFOL 10 MG/ML IV BOLUS
INTRAVENOUS | Status: AC
  Filled 2022-03-31: qty 20

## 2022-03-31 MED ORDER — PROPOFOL 10 MG/ML IV BOLUS
INTRAVENOUS | Status: DC | PRN
  Administered 2022-03-31: 70 mg via INTRAVENOUS

## 2022-03-31 NOTE — Anesthesia Postprocedure Evaluation (Signed)
Anesthesia Post Note ? ?Patient: Nicolas Zuniga ? ?Procedure(s) Performed: CARDIOVERSION ? ?Patient location during evaluation: [facility] ?Anesthesia Type: General ?Level of consciousness: awake and alert ?Pain management: pain level controlled ?Vital Signs Assessment: post-procedure vital signs reviewed and stable ?Respiratory status: spontaneous breathing, nonlabored ventilation, respiratory function stable and patient connected to nasal cannula oxygen ?Cardiovascular status: blood pressure returned to baseline and stable ?Postop Assessment: no apparent nausea or vomiting ?Anesthetic complications: no ? ? ?No notable events documented. ? ? ?Last Vitals:  ?Vitals:  ? 03/31/22 0830 03/31/22 0845  ?BP: 113/82 129/82  ?Pulse: 71 72  ?Resp: 14 19  ?Temp:    ?SpO2: 97% 98%  ?  ?Last Pain:  ?Vitals:  ? 03/31/22 0845  ?TempSrc:   ?PainSc: 0-No pain  ? ? ?  ?  ?  ?  ?  ?  ? ?Martha Clan ? ? ? ? ?

## 2022-03-31 NOTE — Anesthesia Procedure Notes (Signed)
Date/Time: 03/31/2022 8:12 AM ?Performed by: Lily Peer, Demetrias Goodbar, CRNA ?Pre-anesthesia Checklist: Patient identified, Emergency Drugs available, Suction available, Patient being monitored and Timeout performed ?Patient Re-evaluated:Patient Re-evaluated prior to induction ?Oxygen Delivery Method: Nasal cannula ?Induction Type: IV induction ? ? ? ? ?

## 2022-03-31 NOTE — CV Procedure (Signed)
Electrical Cardioversion Procedure Note ?Stevphen Rochester ?[id number] ?[birth year] ? ?Procedure: Electrical Cardioversion ?Indications:  Paroxysmal non valvular atrial fibrillation ? ?Procedure Details ?Consent: Risks of procedure as well as the alternatives and risks of each were explained to the (patient/caregiver).  Consent for procedure obtained. ?Time Out: Verified patient identification, verified procedure, site/side was marked, verified correct patient position, special equipment/implants available, medications/allergies/relevent history reviewed, required imaging and test results available.  Performed ? ?Patient placed on cardiac monitor, pulse oximetry, supplemental oxygen as necessary.  ?Sedation given: Propofol and versed as per anesthesia  ?Pacer pads placed anterior and posterior chest. ? ?Cardioverted 1 time(s).  ?Cardioverted at 120J. ? ?Evaluation ?Findings: Post procedure EKG shows: NSR ?Complications: None ?Patient did  tolerate procedure well. ? ? ?Serafina Royals M.D. FACC ?03/31/2022, 8:12 AM ? ? ? ?

## 2022-03-31 NOTE — Transfer of Care (Signed)
Immediate Anesthesia Transfer of Care Note ? ?Patient: Nicolas Zuniga ? ?Procedure(s) Performed: CARDIOVERSION ? ?Patient Location: Cath Lab ? ?Anesthesia Type:General ? ?Level of Consciousness: awake, alert  and oriented ? ?Airway & Oxygen Therapy: Patient Spontanous Breathing and Patient connected to nasal cannula oxygen ? ?Post-op Assessment: Report given to RN and Post -op Vital signs reviewed and stable ? ?Post vital signs: Reviewed and stable ? ?Last Vitals:  ?Vitals Value Taken Time  ?BP 131/80 03/31/22 0812  ?Temp    ?Pulse 72 03/31/22 0813  ?Resp 18 03/31/22 0813  ?SpO2 97 % 03/31/22 0813  ? ? ?Last Pain:  ?Vitals:  ? 03/31/22 0732  ?TempSrc: Oral  ?PainSc: 0-No pain  ?   ? ?  ? ?Complications: No notable events documented. ?

## 2022-03-31 NOTE — Anesthesia Preprocedure Evaluation (Signed)
Anesthesia Evaluation  ?Patient identified by MRN, date of birth, ID band ?Patient awake ? ? ? ?Reviewed: ?Allergy & Precautions, NPO status , Patient's Chart, lab work & pertinent test results ? ?History of Anesthesia Complications ?Negative for: history of anesthetic complications ? ?Airway ?Mallampati: III ? ? ?Neck ROM: Full ? ? ? Dental ?no notable dental hx. ? ?  ?Pulmonary ?neg pulmonary ROS,  ?  ?Pulmonary exam normal ?breath sounds clear to auscultation ? ? ? ? ? ? Cardiovascular ?hypertension, + dysrhythmias (a fib on Eliquis)  ?Rhythm:Irregular Rate:Normal ? ?ECG 02/24/22:  ?Atrial fibrillation  ?Nonspecific intraventricular block  ?Cannot rule out Anterior infarct , age undetermined ?  ?Neuro/Psych ?PSYCHIATRIC DISORDERS Anxiety negative neurological ROS ?   ? GI/Hepatic ?negative GI ROS,   ?Endo/Other  ?diabetes, Type 2Obesity  ? Renal/GU ?negative Renal ROS  ? ?  ?Musculoskeletal ? ? Abdominal ?  ?Peds ? Hematology ?negative hematology ROS ?(+)   ?Anesthesia Other Findings ?Cardiology note 02/24/22:  ?Plan  ?-Proceed to electrical cardioversion of atrial fibrillation for improvements of symptoms, risk reduction in stroke, and reduced development of congestive heart failure. Patient understands risk and benefits of electrical cardioversion to normal sinus rhythm. This includes the possibility of death, stroke, heart attack, further rhythm disturbances, and reaction to medication management. Patient is at low risk for general anesthesia. ?-apixaban for anticoagulation and further risk reduction of stroke with atrial fibrillation/flutter. Patient understands all risks and benefits of this medication management including the possibility of side effects including bleeding, bruising, stroke, and interactions with other medication management. The patient understands that we are following current guideline therapy for risk reduction. We will follow closely for any side effects  and/or issues listed above. ?-It has been stressed to the patient that the 4 most important factors of a healthy lifestyle with reduce cardiovascular implications includes abstinence of tobacco use, regular physical activity including the possibility of an exercise prescription, healthy diet, and maintaining an appropriate body mass index. The patient will consider these approaches in the future. ? ? Reproductive/Obstetrics ? ?  ? ? ? ? ? ? ? ? ? ? ? ? ? ?  ?  ? ? ? ? ? ? ? ? ?Anesthesia Physical ? ?Anesthesia Plan ? ?ASA: 3 ? ?Anesthesia Plan: General  ? ?Post-op Pain Management:   ? ?Induction: Intravenous ? ?PONV Risk Score and Plan: 2 and Propofol infusion, TIVA and Treatment may vary due to age or medical condition ? ?Airway Management Planned: Natural Airway ? ?Additional Equipment:  ? ?Intra-op Plan:  ? ?Post-operative Plan:  ? ?Informed Consent: I have reviewed the patients History and Physical, chart, labs and discussed the procedure including the risks, benefits and alternatives for the proposed anesthesia with the patient or authorized representative who has indicated his/her understanding and acceptance.  ? ? ? ? ? ?Plan Discussed with: CRNA ? ?Anesthesia Plan Comments: (LMA/GETA backup discussed.  Patient consented for risks of anesthesia including but not limited to:  ?- adverse reactions to medications ?- damage to eyes, teeth, lips or other oral mucosa ?- nerve damage due to positioning  ?- sore throat or hoarseness ?- damage to heart, brain, nerves, lungs, other parts of body or loss of life ? ?Informed patient about role of CRNA in peri- and intra-operative care.  Patient voiced understanding.)  ? ? ? ? ? ? ?Anesthesia Quick Evaluation ? ?

## 2022-08-11 ENCOUNTER — Other Ambulatory Visit: Payer: Self-pay

## 2022-08-11 ENCOUNTER — Telehealth: Payer: Self-pay

## 2022-08-11 DIAGNOSIS — R194 Change in bowel habit: Secondary | ICD-10-CM

## 2022-08-11 MED ORDER — NA SULFATE-K SULFATE-MG SULF 17.5-3.13-1.6 GM/177ML PO SOLN
1.0000 | Freq: Once | ORAL | 0 refills | Status: AC
Start: 2022-08-11 — End: 2022-08-11

## 2022-08-11 NOTE — Telephone Encounter (Signed)
Clearance faxed to Dr Alveria Apley office for procedure/blood thinner clearance

## 2022-08-17 NOTE — Telephone Encounter (Signed)
Procedure clearance faxed x 3

## 2022-08-25 NOTE — Telephone Encounter (Signed)
Faxed clearance x 2

## 2022-08-31 NOTE — Telephone Encounter (Signed)
Fax received for blood thinner clearance  Pt is to stop Eliquis 3 days prior and restart 1 day after

## 2022-09-02 NOTE — Telephone Encounter (Signed)
Left message on voicemail.

## 2022-09-08 ENCOUNTER — Other Ambulatory Visit: Payer: Self-pay

## 2022-09-08 ENCOUNTER — Ambulatory Visit
Admission: RE | Admit: 2022-09-08 | Discharge: 2022-09-08 | Disposition: A | Discharge status: Home / Self Care | Payer: Medicare Other | Attending: Gastroenterology | Admitting: Gastroenterology

## 2022-09-08 ENCOUNTER — Ambulatory Visit: Payer: Medicare Other | Admitting: Anesthesiology

## 2022-09-08 ENCOUNTER — Encounter
Admission: RE | Disposition: A | Discharge status: Home / Self Care | Payer: Self-pay | Source: Home / Self Care | Attending: Gastroenterology

## 2022-09-08 DIAGNOSIS — K5900 Constipation, unspecified: Secondary | ICD-10-CM | POA: Diagnosis not present

## 2022-09-08 DIAGNOSIS — C187 Malignant neoplasm of sigmoid colon: Secondary | ICD-10-CM | POA: Insufficient documentation

## 2022-09-08 DIAGNOSIS — D123 Benign neoplasm of transverse colon: Secondary | ICD-10-CM | POA: Diagnosis not present

## 2022-09-08 DIAGNOSIS — K449 Diaphragmatic hernia without obstruction or gangrene: Secondary | ICD-10-CM | POA: Insufficient documentation

## 2022-09-08 DIAGNOSIS — K6389 Other specified diseases of intestine: Secondary | ICD-10-CM

## 2022-09-08 DIAGNOSIS — E119 Type 2 diabetes mellitus without complications: Secondary | ICD-10-CM | POA: Diagnosis not present

## 2022-09-08 DIAGNOSIS — I4891 Unspecified atrial fibrillation: Secondary | ICD-10-CM | POA: Insufficient documentation

## 2022-09-08 DIAGNOSIS — K635 Polyp of colon: Secondary | ICD-10-CM | POA: Diagnosis not present

## 2022-09-08 DIAGNOSIS — K2950 Unspecified chronic gastritis without bleeding: Secondary | ICD-10-CM | POA: Insufficient documentation

## 2022-09-08 DIAGNOSIS — R194 Change in bowel habit: Secondary | ICD-10-CM

## 2022-09-08 DIAGNOSIS — K3189 Other diseases of stomach and duodenum: Secondary | ICD-10-CM | POA: Insufficient documentation

## 2022-09-08 DIAGNOSIS — I10 Essential (primary) hypertension: Secondary | ICD-10-CM | POA: Diagnosis not present

## 2022-09-08 DIAGNOSIS — C182 Malignant neoplasm of ascending colon: Secondary | ICD-10-CM | POA: Diagnosis not present

## 2022-09-08 DIAGNOSIS — R1010 Upper abdominal pain, unspecified: Secondary | ICD-10-CM | POA: Insufficient documentation

## 2022-09-08 DIAGNOSIS — C189 Malignant neoplasm of colon, unspecified: Principal | ICD-10-CM

## 2022-09-08 DIAGNOSIS — C7A1 Malignant poorly differentiated neuroendocrine tumors: Secondary | ICD-10-CM

## 2022-09-08 HISTORY — DX: Malignant poorly differentiated neuroendocrine tumors: C7A.1

## 2022-09-08 HISTORY — DX: Malignant neoplasm of colon, unspecified: C18.9

## 2022-09-08 HISTORY — PX: COLONOSCOPY WITH PROPOFOL: SHX5780

## 2022-09-08 LAB — GLUCOSE, CAPILLARY: Glucose-Capillary: 185 mg/dL — ABNORMAL HIGH (ref 70–99)

## 2022-09-08 SURGERY — COLONOSCOPY WITH PROPOFOL
Anesthesia: General

## 2022-09-08 MED ORDER — GLYCOPYRROLATE 0.2 MG/ML IJ SOLN
INTRAMUSCULAR | Status: DC | PRN
  Administered 2022-09-08: .2 mg via INTRAVENOUS

## 2022-09-08 MED ORDER — PROPOFOL 500 MG/50ML IV EMUL
INTRAVENOUS | Status: DC | PRN
  Administered 2022-09-08: 150 ug/kg/min via INTRAVENOUS

## 2022-09-08 MED ORDER — PROPOFOL 1000 MG/100ML IV EMUL
INTRAVENOUS | Status: AC
  Filled 2022-09-08: qty 100

## 2022-09-08 MED ORDER — DEXMEDETOMIDINE HCL IN NACL 200 MCG/50ML IV SOLN
INTRAVENOUS | Status: DC | PRN
  Administered 2022-09-08: 12 ug via INTRAVENOUS

## 2022-09-08 MED ORDER — SODIUM CHLORIDE 0.9 % IV SOLN: INTRAVENOUS | Status: DC

## 2022-09-08 MED ORDER — EPHEDRINE 5 MG/ML INJ
INTRAVENOUS | Status: AC
  Filled 2022-09-08: qty 5

## 2022-09-08 MED ORDER — LIDOCAINE HCL (CARDIAC) PF 100 MG/5ML IV SOSY
PREFILLED_SYRINGE | INTRAVENOUS | Status: DC | PRN
  Administered 2022-09-08: 50 mg via INTRAVENOUS

## 2022-09-08 MED ORDER — EPHEDRINE SULFATE (PRESSORS) 50 MG/ML IJ SOLN
INTRAMUSCULAR | Status: DC | PRN
  Administered 2022-09-08: 10 mg via INTRAVENOUS

## 2022-09-08 MED ORDER — SPOT INK MARKER SYRINGE KIT
PACK | SUBMUCOSAL | Status: DC | PRN
  Administered 2022-09-08: 4 mL via SUBMUCOSAL

## 2022-09-08 MED ORDER — GLYCOPYRROLATE 0.2 MG/ML IJ SOLN
INTRAMUSCULAR | Status: AC
  Filled 2022-09-08: qty 1

## 2022-09-08 MED ORDER — PROPOFOL 10 MG/ML IV BOLUS
INTRAVENOUS | Status: DC | PRN
  Administered 2022-09-08: 40 mg via INTRAVENOUS
  Administered 2022-09-08: 60 mg via INTRAVENOUS

## 2022-09-08 NOTE — Transfer of Care (Signed)
Immediate Anesthesia Transfer of Care Note  Patient: Nicolas Zuniga  Procedure(s) Performed: COLONOSCOPY WITH PROPOFOL  Patient Location: PACU and Endoscopy Unit  Anesthesia Type:General  Level of Consciousness: drowsy and patient cooperative  Airway & Oxygen Therapy: Patient Spontanous Breathing  Post-op Assessment: Report given to RN and Post -op Vital signs reviewed and stable  Post vital signs: Reviewed and stable  Last Vitals:  Vitals Value Taken Time  BP 98/42 09/08/22 0956  Temp 36.2 C 09/08/22 0956  Pulse 51 09/08/22 0957  Resp 22 09/08/22 0957  SpO2 99 % 09/08/22 0957  Vitals shown include unvalidated device data.  Last Pain:  Vitals:   09/08/22 0956  TempSrc: Temporal  PainSc: 0-No pain         Complications: No notable events documented.

## 2022-09-08 NOTE — H&P (Signed)
Nicolas Lame, MD [address] [place], [place] [address] Phone:[phone] Fax : [phone]  Primary Care Physician:  Hortencia Pilar, MD Primary Gastroenterologist:  Dr. Allen Norris  Pre-Procedure History & Physical: HPI:  Nicolas Zuniga is a 66 y.o. male is here for an endoscopy and colonoscopy.   Past Medical History:  Diagnosis Date   A-fib [facility])    Diabetes mellitus without complication ([place])    Hypertension     Past Surgical History:  Procedure Laterality Date   CARDIOVERSION N/A 12/22/2018   Procedure: CARDIOVERSION (CATH LAB);  Surgeon: Corey Skains, MD;  Location: [facility] ORS;  Service: Cardiovascular;  Laterality: N/A;   CARDIOVERSION N/A 08/18/2021   Procedure: CARDIOVERSION;  Surgeon: Corey Skains, MD;  Location: [facility];  Service: Cardiovascular;  Laterality: N/A;   CARDIOVERSION N/A 03/11/2022   Procedure: CARDIOVERSION;  Surgeon: Corey Skains, MD;  Location: [facility] ORS;  Service: Cardiovascular;  Laterality: N/A;   CARDIOVERSION N/A 03/31/2022   Procedure: CARDIOVERSION;  Surgeon: Corey Skains, MD;  Location: [facility] OR[facility];  Service: Cardiovascular;  Laterality: N/A;   ELECTROPHYSIOLOGIC STUDY N/A 07/09/2016   Procedure: CARDIOVERSION;  Surgeon: Dionisio David, MD;  Location: [facility] ORS;  Service: Cardiovascular;  Laterality: N/A;   TEE WITHOUT CARDIOVERSION N/A 07/09/2016   Procedure: TRANSESOPHAGEAL ECHOCARDIOGRAM (TEE);  Surgeon: Dionisio David, MD;  Location: [facility] ORS;  Service: Cardiovascular;  Laterality: N/A;    Prior to Admission medications   Medication Sig Start Date End Date Taking? Authorizing Provider  metoprolol (LOPRESSOR) 100 MG tablet Take 100 mg by mouth 2 (two) times daily.   Yes [provider]  apixaban (ELIQUIS) 5 MG TABS tablet Take 5 mg by mouth 2 (two) times daily.    [provider]  Cholecalciferol (VITAMIN D) 50 MCG (2000 UT) CAPS Take 2,000 Units by mouth daily.    [provider]  Coenzyme Q10  (COQ10) 100 MG CAPS Take 100 mg by mouth daily.    [provider]  flecainide (TAMBOCOR) 100 MG tablet Take 100 mg by mouth 2 (two) times daily. 03/17/22   [provider]  glipiZIDE (GLUCOTROL XL) 10 MG 24 hr tablet Take 10 mg by mouth 2 (two) times daily.     [provider]  metFORMIN (GLUCOPHAGE-XR) 500 MG 24 hr tablet Take 500 mg by mouth daily with breakfast. 02/10/21   [provider]  Multiple Vitamins-Minerals (MULTIVITAMIN WITH MINERALS) tablet Take 1 tablet by mouth daily with breakfast.     [provider]  Omega-3 Fatty Acids (FISH OIL) 1000 MG CAPS Take 1,000 mg by mouth daily.    [provider]  OZEMPIC, 1 MG/DOSE, 4 MG/3ML SOPN Inject 1 mg into the skin every Wednesday. 08/02/21   [provider]  Turmeric 400 MG CAPS Take 400 mg by mouth daily.    [provider]  vitamin B-12 (CYANOCOBALAMIN) 100 MCG tablet Take 500 mcg by mouth daily.    [provider]  Vitamin E 400 units TABS Take 400 Units by mouth daily.    [provider]    Allergies as of 08/11/2022   (No Known Allergies)    Family History  Problem Relation Age of Onset   Varicose Veins Mother    Heart disease Mother    Diabetes Mother    COPD Father     Social History   Socioeconomic History   Marital status: Married    Spouse name: Not on file  Number of children: Not on file   Years of education: Not on file   Highest education level: Not on file  Occupational History   Not on file  Tobacco Use   Smoking status: Never   Smokeless tobacco: Never  Vaping Use   Vaping Use: Never used  Substance and Sexual Activity   Alcohol use: No   Drug use: No   Sexual activity: Not on file  Other Topics Concern   Not on file  Social History Narrative   Lives with wife, [name], with on pet, cat.   Social Determinants of Health   Financial Resource Strain: Not on file  Food Insecurity: Not on file  Transportation  Needs: Not on file  Physical Activity: Not on file  Stress: Not on file  Social Connections: Not on file  Intimate Partner Violence: Not on file    Review of Systems: See HPI, otherwise negative ROS  Physical Exam: BP (!) 140/71   Pulse (!) 45   Temp (!) 97 F (36.1 C) (Temporal)   Resp 18   Ht '6\' 2"'$  (1.88 m)   Wt 127 kg   SpO2 100%   BMI 35.95 kg/m  General:   Alert,  pleasant and cooperative in NAD Head:  Normocephalic and atraumatic. Neck:  Supple; no masses or thyromegaly. Lungs:  Clear throughout to auscultation.    Heart:  Regular rate and rhythm. Abdomen:  Soft, nontender and nondistended. Normal bowel sounds, without guarding, and without rebound.   Neurologic:  Alert and  oriented x4;  grossly normal neurologically.  Impression/Plan: Nicolas Zuniga is here for an endoscopy and colonoscopy to be performed for change in bowel habits and abd pain  Risks, benefits, limitations, and alternatives regarding  endoscopy and colonoscopy have been reviewed with the patient.  Questions have been answered.  All parties agreeable.   Nicolas Lame, MD  09/08/2022, 9:15 AM

## 2022-09-08 NOTE — Anesthesia Postprocedure Evaluation (Signed)
Anesthesia Post Note  Patient: Nicolas Zuniga  Procedure(s) Performed: COLONOSCOPY WITH PROPOFOL  Patient location during evaluation: Endoscopy Anesthesia Type: General Level of consciousness: awake and alert Pain management: pain level controlled Vital Signs Assessment: post-procedure vital signs reviewed and stable Respiratory status: spontaneous breathing, nonlabored ventilation, respiratory function stable and patient connected to nasal cannula oxygen Cardiovascular status: blood pressure returned to baseline and stable Postop Assessment: no apparent nausea or vomiting Anesthetic complications: no   No notable events documented.   Last Vitals:  Vitals:   09/08/22 0956 09/08/22 1006  BP: (!) 98/42   Pulse:  (!) 51  Resp: 14   Temp: (!) 36.2 C   SpO2: 98%     Last Pain:  Vitals:   09/08/22 1016  TempSrc:   PainSc: 0-No pain                 Precious Haws Fionna Merriott

## 2022-09-08 NOTE — Progress Notes (Signed)
Per Dr. Dorothey Baseman request, an appointment with Dr. Dahlia Byes has been scheduled for Monday September 14, 2022 at 1:30 pm. Patient's wife Ahan Eisenberger was notified at this time by telephone.

## 2022-09-08 NOTE — Op Note (Signed)
[facility] Gastroenterology Patient Name: Nicolas Zuniga Procedure Date: 09/08/2022 9:19 AM MRN: [id number] Account #: [id number] Date of Birth: 11-Jun-1956 Admit Type: Outpatient Age: 66 Room: [facility] ENDO ROOM 4 Gender: Male Note Status: Finalized Instrument Name: Upper Endoscope [id number] Procedure:             Upper GI endoscopy Indications:           Upper abdominal pain Providers:             Lucilla Lame MD, MD Referring MD:          Kerin Perna MD, MD (Referring MD) Medicines:             Propofol per Anesthesia Complications:         No immediate complications. Procedure:             Pre-Anesthesia Assessment:                        - Prior to the procedure, a History and Physical was                         performed, and patient medications and allergies were                         reviewed. The patient's tolerance of previous                         anesthesia was also reviewed. The risks and benefits                         of the procedure and the sedation options and risks                         were discussed with the patient. All questions were                         answered, and informed consent was obtained. Prior                         Anticoagulants: The patient has taken no previous                         anticoagulant or antiplatelet agents. ASA Grade                         Assessment: II - A patient with mild systemic disease.                         After reviewing the risks and benefits, the patient                         was deemed in satisfactory condition to undergo the                         procedure.                        After obtaining informed consent, the endoscope was  passed under direct vision. Throughout the procedure,                         the patient's blood pressure, pulse, and oxygen                         saturations were monitored continuously. The Endoscope                         was  introduced through the mouth, and advanced to the                         second part of duodenum. The upper GI endoscopy was                         accomplished without difficulty. The patient tolerated                         the procedure well. Findings:      A small hiatal hernia was present.      The entire examined stomach was normal. Biopsies were taken with a cold       forceps for histology.      Patchy erythematous mucosa without active bleeding and with no stigmata       of bleeding was found in the entire duodenum. Impression:            - Small hiatal hernia.                        - Normal stomach. Biopsied.                        - Erythematous duodenopathy. Recommendation:        - Discharge patient to home.                        - Resume previous diet.                        - Continue present medications.                        - Await pathology results.                        - Perform a colonoscopy today. Procedure Code(s):     --- Professional ---                        [phone], Esophagogastroduodenoscopy, flexible,                         transoral; with biopsy, single or multiple Diagnosis Code(s):     --- Professional ---                        R10.10, Upper abdominal pain, unspecified                        K31.89, Other diseases of stomach and duodenum CPT copyright 2019 American Medical Association. All rights reserved. The codes documented in this report are preliminary and  upon coder review may  be revised to meet current compliance requirements. Lucilla Lame MD, MD 09/08/2022 9:33:02 AM This report has been signed electronically. Number of Addenda: 0 Note Initiated On: 09/08/2022 9:19 AM Estimated Blood Loss:  Estimated blood loss: none.      [facility]

## 2022-09-08 NOTE — Anesthesia Procedure Notes (Signed)
Procedure Name: MAC Date/Time: 09/08/2022 9:11 AM  Performed by: Jonna Clark, CRNAPre-anesthesia Checklist: Patient identified, Emergency Drugs available, Suction available, Patient being monitored and Timeout performed Patient Re-evaluated:Patient Re-evaluated prior to induction Oxygen Delivery Method: Nasal cannula Induction Type: IV induction Placement Confirmation: positive ETCO2

## 2022-09-08 NOTE — Anesthesia Preprocedure Evaluation (Signed)
Anesthesia Evaluation  Patient identified by MRN, date of birth, ID band Patient awake    Reviewed: Allergy & Precautions, NPO status , Patient's Chart, lab work & pertinent test results  History of Anesthesia Complications Negative for: history of anesthetic complications  Airway Mallampati: III  TM Distance: <3 FB Neck ROM: full    Dental  (+) Chipped   Pulmonary neg pulmonary ROS, neg shortness of breath,    Pulmonary exam normal        Cardiovascular Exercise Tolerance: Good hypertension, + dysrhythmias Atrial Fibrillation      Neuro/Psych negative neurological ROS  negative psych ROS   GI/Hepatic negative GI ROS, Neg liver ROS, neg GERD  ,  Endo/Other  negative endocrine ROSdiabetes, Type 2  Renal/GU Renal disease  negative genitourinary   Musculoskeletal   Abdominal   Peds  Hematology negative hematology ROS (+)   Anesthesia Other Findings Past Medical History: No date: A-fib ([facility]) No date: Diabetes mellitus without complication ([facility]) No date: Hypertension  Past Surgical History: 12/22/2018: CARDIOVERSION; N/A     Comment:  Procedure: CARDIOVERSION (CATH LAB);  Surgeon: Corey Skains, MD;  Location: [facility] ORS;  Service:               Cardiovascular;  Laterality: N/A; 08/18/2021: CARDIOVERSION; N/A     Comment:  Procedure: CARDIOVERSION;  Surgeon: Corey Skains,               MD;  Location: [facility] OR[facility];  Service: Cardiovascular;                Laterality: N/A; 03/11/2022: CARDIOVERSION; N/A     Comment:  Procedure: CARDIOVERSION;  Surgeon: Corey Skains,               MD;  Location: [facility] ORS;  Service: Cardiovascular;                Laterality: N/A; 03/31/2022: CARDIOVERSION; N/A     Comment:  Procedure: CARDIOVERSION;  Surgeon: Corey Skains,               MD;  Location: [facility] OR[facility];  Service: Cardiovascular;                Laterality: N/A; 07/09/2016: ELECTROPHYSIOLOGIC STUDY;  N/A     Comment:  Procedure: CARDIOVERSION;  Surgeon: Dionisio David, MD;               Location: [facility] ORS;  Service: Cardiovascular;                Laterality: N/A; 07/09/2016: TEE WITHOUT CARDIOVERSION; N/A     Comment:  Procedure: TRANSESOPHAGEAL ECHOCARDIOGRAM (TEE);                Surgeon: Dionisio David, MD;  Location: [facility] ORS;                Service: Cardiovascular;  Laterality: N/A;     Reproductive/Obstetrics negative OB ROS                             Anesthesia Physical Anesthesia Plan  ASA: 3  Anesthesia Plan: General   Post-op Pain Management:    Induction: Intravenous  PONV Risk Score and Plan: Propofol infusion and TIVA  Airway Management Planned: Natural Airway and Nasal Cannula  Additional Equipment:   Intra-op Plan:   Post-operative Plan:  Informed Consent: I have reviewed the patients History and Physical, chart, labs and discussed the procedure including the risks, benefits and alternatives for the proposed anesthesia with the patient or authorized representative who has indicated his/her understanding and acceptance.     Dental Advisory Given  Plan Discussed with: Anesthesiologist, CRNA and Surgeon  Anesthesia Plan Comments: (Patient consented for risks of anesthesia including but not limited to:  - adverse reactions to medications - risk of airway placement if required - damage to eyes, teeth, lips or other oral mucosa - nerve damage due to positioning  - sore throat or hoarseness - Damage to heart, brain, nerves, lungs, other parts of body or loss of life  Patient voiced understanding.)        Anesthesia Quick Evaluation

## 2022-09-08 NOTE — Op Note (Signed)
[facility] Gastroenterology Patient Name: Nicolas Zuniga Procedure Date: 09/08/2022 9:10 AM MRN: [id number] Account #: [id number] Date of Birth: Aug 15, 1956 Admit Type: Outpatient Age: 66 Room: [facility] ENDO ROOM 4 Gender: Male Note Status: Finalized Instrument Name: Park Meo [id number] Procedure:             Colonoscopy Indications:           Change in bowel habits, Constipation Providers:             Lucilla Lame MD, MD Referring MD:          Kerin Perna MD, MD (Referring MD) Medicines:             Propofol per Anesthesia Complications:         No immediate complications. Procedure:             Pre-Anesthesia Assessment:                        - Prior to the procedure, a History and Physical was                         performed, and patient medications and allergies were                         reviewed. The patient's tolerance of previous                         anesthesia was also reviewed. The risks and benefits                         of the procedure and the sedation options and risks                         were discussed with the patient. All questions were                         answered, and informed consent was obtained. Prior                         Anticoagulants: The patient has taken no previous                         anticoagulant or antiplatelet agents. ASA Grade                         Assessment: II - A patient with mild systemic disease.                         After reviewing the risks and benefits, the patient                         was deemed in satisfactory condition to undergo the                         procedure.                        After obtaining informed consent, the colonoscope was  passed under direct vision. Throughout the procedure,                         the patient's blood pressure, pulse, and oxygen                         saturations were monitored continuously. The                          Colonoscope was introduced through the anus and                         advanced to the the ascending colon to examine a mass.                         This was the intended extent. The colonoscopy was                         performed without difficulty. The patient tolerated                         the procedure well. The quality of the bowel                         preparation was excellent. Findings:      The perianal and digital rectal examinations were normal.      A 3 mm polyp was found in the sigmoid colon. The polyp was sessile. The       polyp was removed with a cold snare. Resection and retrieval were       complete.      A 4 mm polyp was found in the transverse colon. The polyp was sessile.       The polyp was removed with a cold snare. Resection and retrieval were       complete.      An ulcerated partially obstructing large mass was found in the ascending       colon. The mass was circumferential. No bleeding was present. This was       biopsied with a cold forceps for histology. Area was tattooed with an       injection of [place] ink. Impression:            - One 3 mm polyp in the sigmoid colon, removed with a                         cold snare. Resected and retrieved.                        - One 4 mm polyp in the transverse colon, removed with                         a cold snare. Resected and retrieved.                        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 Biopsied. Tattooed. Recommendation:        -  Discharge patient to home.                        - Resume previous diet.                        - Continue present medications.                        - Await pathology results.                        - Refer to a Psychologist, sport and exercise. Procedure Code(s):     --- Professional ---                        [phone], Colonoscopy, flexible; with removal of                         tumor(s), polyp(s), or other lesion(s) by snare                          technique                        [id number], Colonoscopy, flexible; with directed submucosal                         injection(s), any substance                        [id number], 59, Colonoscopy, flexible; with biopsy, single                         or multiple CPT copyright 2019 American Medical Association. All rights reserved. The codes documented in this report are preliminary and upon coder review may  be revised to meet current compliance requirements. Lucilla Lame MD, MD 09/08/2022 9:58:47 AM This report has been signed electronically. Number of Addenda: 0 Note Initiated On: 09/08/2022 9:10 AM Scope Withdrawal Time: 0 hours 5 minutes 55 seconds  Total Procedure Duration: 0 hours 11 minutes 45 seconds  Estimated Blood Loss:  Estimated blood loss: none.      [facility]

## 2022-09-09 ENCOUNTER — Encounter: Payer: Self-pay | Admitting: Gastroenterology

## 2022-09-09 LAB — SURGICAL PATHOLOGY

## 2022-09-14 ENCOUNTER — Other Ambulatory Visit
Admission: RE | Admit: 2022-09-14 | Discharge: 2022-09-14 | Disposition: A | Discharge status: Home / Self Care | Payer: Medicare Other | Source: Ambulatory Visit | Attending: Surgery | Admitting: Surgery

## 2022-09-14 ENCOUNTER — Encounter: Payer: Self-pay | Admitting: Surgery

## 2022-09-14 ENCOUNTER — Ambulatory Visit (INDEPENDENT_AMBULATORY_CARE_PROVIDER_SITE_OTHER): Payer: Medicare Other | Admitting: Surgery

## 2022-09-14 VITALS — BP 127/77 | HR 52 | Temp 98.0°F | Ht 74.0 in | Wt 277.0 lb

## 2022-09-14 DIAGNOSIS — I4891 Unspecified atrial fibrillation: Secondary | ICD-10-CM

## 2022-09-14 DIAGNOSIS — C189 Malignant neoplasm of colon, unspecified: Secondary | ICD-10-CM | POA: Diagnosis present

## 2022-09-14 DIAGNOSIS — G8929 Other chronic pain: Secondary | ICD-10-CM | POA: Insufficient documentation

## 2022-09-14 DIAGNOSIS — R1011 Right upper quadrant pain: Secondary | ICD-10-CM | POA: Diagnosis present

## 2022-09-14 DIAGNOSIS — K8020 Calculus of gallbladder without cholecystitis without obstruction: Secondary | ICD-10-CM

## 2022-09-14 LAB — CBC WITH DIFFERENTIAL/PLATELET
Abs Immature Granulocytes: 0.03 10*3/uL (ref 0.00–0.07)
Basophils Absolute: 0.1 10*3/uL (ref 0.0–0.1)
Basophils Relative: 1 %
Eosinophils Absolute: 0.1 10*3/uL (ref 0.0–0.5)
Eosinophils Relative: 1 %
HCT: 35.7 % — ABNORMAL LOW (ref 39.0–52.0)
Hemoglobin: 11.1 g/dL — ABNORMAL LOW (ref 13.0–17.0)
Immature Granulocytes: 0 %
Lymphocytes Relative: 20 %
Lymphs Abs: 2.2 10*3/uL (ref 0.7–4.0)
MCH: 24.2 pg — ABNORMAL LOW (ref 26.0–34.0)
MCHC: 31.1 g/dL (ref 30.0–36.0)
MCV: 77.8 fL — ABNORMAL LOW (ref 80.0–100.0)
Monocytes Absolute: 1 10*3/uL (ref 0.1–1.0)
Monocytes Relative: 9 %
Neutro Abs: 7.6 10*3/uL (ref 1.7–7.7)
Neutrophils Relative %: 69 %
Platelets: 269 10*3/uL (ref 150–400)
RBC: 4.59 MIL/uL (ref 4.22–5.81)
RDW: 13.8 % (ref 11.5–15.5)
WBC: 11 10*3/uL — ABNORMAL HIGH (ref 4.0–10.5)
nRBC: 0 % (ref 0.0–0.2)

## 2022-09-14 LAB — PROTIME-INR
INR: 1.2 (ref 0.8–1.2)
Prothrombin Time: 15.4 seconds — ABNORMAL HIGH (ref 11.4–15.2)

## 2022-09-14 LAB — COMPREHENSIVE METABOLIC PANEL
ALT: 15 U/L (ref 0–44)
AST: 21 U/L (ref 15–41)
Albumin: 3.8 g/dL (ref 3.5–5.0)
Alkaline Phosphatase: 75 U/L (ref 38–126)
Anion gap: 8 (ref 5–15)
BUN: 12 mg/dL (ref 8–23)
CO2: 23 mmol/L (ref 22–32)
Calcium: 9.9 mg/dL (ref 8.9–10.3)
Chloride: 104 mmol/L (ref 98–111)
Creatinine, Ser: 1.06 mg/dL (ref 0.61–1.24)
GFR, Estimated: >60 mL/min (ref >60)
Glucose, Bld: 258 mg/dL — ABNORMAL HIGH (ref 70–99)
Potassium: 4.1 mmol/L (ref 3.5–5.1)
Sodium: 135 mmol/L (ref 135–145)
Total Bilirubin: 0.7 mg/dL (ref 0.3–1.2)
Total Protein: 7.6 g/dL (ref 6.5–8.1)

## 2022-09-14 MED ORDER — METRONIDAZOLE 500 MG PO TABS: ORAL_TABLET | ORAL | 0 refills | Status: DC

## 2022-09-14 MED ORDER — NEOMYCIN SULFATE 500 MG PO TABS: ORAL_TABLET | ORAL | 0 refills | Status: DC

## 2022-09-14 MED ORDER — POLYETHYLENE GLYCOL 3350 17 GM/SCOOP PO POWD: ORAL | 0 refills | Status: DC

## 2022-09-14 MED ORDER — BISACODYL 5 MG PO TBEC: DELAYED_RELEASE_TABLET | ORAL | 0 refills | Status: DC

## 2022-09-14 NOTE — Patient Instructions (Addendum)
We will get you scheduled for a CT of the Chest, Abdomen, and Pelvis with contrast.   You may have your lab work done today at [facility], [facility]. CEA, CMP, CBC, INR  We have scheduled you for a CT Scan of your Chest, Abdomen, and Pelvis with contrast. This has been scheduled at [facility] on 09/17/22. They are located at [address]. Please arrive there by 8:00 am. If you need to reschedule your Scan, you may do so by calling [phone]. Please let us know if you reschedule your scan as we have to get authorization from your insurance for this.   Bring a list of medications with you to your appointment and you may have nothing to eat or drink 4 hours prior to your CT Scan.    We have discussed removing a portion of your damaged colon through 4 small incisions today. We have scheduled this surgery for 09/24/22 at [facility] with Dr. Dahlia Byes. Please plan a hospital stay of 3-7 days for surgery and recovery time.  You will need to be off of your Eliquis for 48 hours prior to surgery  We will have you complete a bowel prep prior to your surgery. Please see information provided.  You have also been given a (Blue) Pre-Care Sheet with more information regarding your particular surgery. Our surgery scheduler will call you to verify surgery date and to go over information.  Please review all information given.  You will need to arrange to be out of work for approximately 2 weeks and then you may return with a lifting restriction for 4 more weeks. If you have FMLA or Disability paperwork that needs to be filled out, please have your company fax your paperwork to [phone] or you may drop this by either office. This paperwork will be filled out within 3 days after your surgery has been completed.  Please call our office with any questions or concerns prior to your scheduled surgery.   Laparoscopic Colectomy Laparoscopic colectomy is surgery to remove part or all  of the large intestine (colon). This procedure may be used to treat several conditions, including: Inflammation and infection of the colon (diverticulitis). Tumors or masses in the colon. Inflammatory bowel disease, such as Crohn disease or ulcerative colitis. Colectomy is an option when symptoms cannot be controlled with medicines. Bleeding from the colon that cannot be controlled by another method. Blockage or obstruction of the colon.  Tell a health care provider about: Any allergies you have. All medicines you are taking, including vitamins, herbs, eye drops, creams, and over-the-counter medicines. Any problems you or family members have had with anesthetic medicines. Any blood disorders you have. Any surgeries you have had. Any medical conditions you have. What are the risks? Generally, this is a safe procedure. However, problems may occur, including: Infection. Bleeding. Allergic reactions to medicines or dyes. Damage to other structures or organs. Leaking from where the colon was sewn together. Future blockage of the small intestines from scar tissue. Another surgery may be needed to repair this. Needing to convert to an open procedure. Complications such as damage to other organs or excessive bleeding may require the surgeon to convert from a laparoscopic procedure to an open procedure. This involves making a larger incision in the abdomen.  What happens before the procedure? Staying hydrated Follow instructions from your health care provider about hydration, which may include: Up to 2 hours before the procedure - you may  continue to drink clear liquids, such as water, clear fruit juice, black coffee, and plain tea.  Eating and drinking restrictions Follow instructions from your health care provider about eating and drinking, which may include: 8 hours before the procedure - stop eating heavy meals, meals with high fiber, or foods such as meat, fried foods, or fatty foods. 6  hours before the procedure - stop eating light meals or foods, such as toast or cereal. 6 hours before the procedure - stop drinking milk or drinks that contain milk. 2 hours before the procedure - stop drinking clear liquids.  Medicines Ask your health care provider about: Changing or stopping your regular medicines. This is especially important if you are taking diabetes medicines or blood thinners. Taking medicines such as aspirin and ibuprofen. These medicines can thin your blood. Do not take these medicines before your procedure if your health care provider instructs you not to. You may be given antibiotic medicine to clean out bacteria from your colon. Follow the directions carefully and take the medicine at the correct time. General instructions You may be prescribed an oral bowel prep to clean out your colon in preparation for the surgery: Follow instructions from your health care provider about how to do this. Do not eat or drink anything else after you have started the bowel prep, unless your health care provider tells you it is safe to do so. Do not use any products that contain nicotine or tobacco, such as cigarettes and e-cigarettes. If you need help quitting, ask your health care provider. What happens during the procedure? To reduce your risk of infection: Your health care team will wash or sanitize their hands. Your skin will be washed with soap. An IV tube will be inserted into one of your veins to deliver fluid and medication. You will be given one of the following: A medicine to help you relax (sedative). A medicine to make you fall asleep (general anesthetic). Small monitors will be connected to your body. They will be used to check your heart, blood pressure, and oxygen level. A breathing tube may be placed into your lungs during the procedure. A thin, flexible tube (catheter) will be placed into your bladder to drain urine. A tube may be placed through your nose and  into your stomach to drain stomach fluids (nasogastric tube, or NG tube). Your abdomen will be filled with air so it expands. This gives the surgeon more room to operate and makes your organs easier to see. Several small cuts (incisions) will be made in your abdomen. A thin, lighted tube with a tiny camera on the end (laparoscope) will be put through one of the small incisions. The camera on the laparoscope will send a picture to a computer screen in the operating room. This will give the surgeon a good view inside your abdomen. Hollow tubes will be put through the other small incisions in your abdomen. The tools that are needed for the procedure will be put through these tubes. Clamps or staples will be put on both ends of the diseased part of the colon. The part of the intestine between the clamps or staples will be removed. If possible, the ends of the healthy colon that remain will be stitched (sutured) or stapled together to allow your body to pass waste (stool). Sometimes, the remaining colon cannot be stitched back together. If this is the case, a colostomy will be needed. If you need a colostomy: An opening to the outside of your  body (stoma) will be made through your abdomen. The end of your colon will be brought to the opening. It will be stitched to the skin. A bag will be attached to the opening. Stool will drain into this removable bag. The colostomy may be temporary or permanent. The incisions from the colectomy will be closed with sutures or staples. The procedure may vary among health care providers and hospitals. What happens after the procedure? Your blood pressure, heart rate, breathing rate, and blood oxygen level will be monitored until the medicines you were given have worn off. You will receive fluids through an IV tube until your bowels start to work properly. Once your bowels are working again, you will be given clear liquids first and then solid food as tolerated. You  will be given medicines to control your pain and nausea, if needed. Do not drive for 24 hours if you were given a sedative. This information is not intended to replace advice given to you by your health care provider. Make sure you discuss any questions you have with your health care provider. Document Released: 02/13/2003 Document Revised: 08/24/2016 Document Reviewed: 08/24/2016 Elsevier Interactive Patient Education  2018 [employer].     Laparoscopic Colectomy, Care After This sheet gives you information about how to care for yourself after your procedure. Your health care provider may also give you more specific instructions. If you have problems or questions, contact your health care provider. What can I expect after the procedure? After your procedure, it is common to have the following: Pain in your abdomen, especially in the incision areas. You will be given medicine to control the pain. Tiredness. This is a normal part of the recovery process. Your energy level will return to normal over the next several weeks. Changes in your bowel movements, such as constipation or needing to go more often. Talk with your health care provider about how to manage this.  Follow these instructions at home: Medicines Take over-the-counter and prescription medicines only as told by your health care provider. Do not drive or use heavy machinery while taking prescription pain medicine. Do not drink alcohol while taking prescription pain medicine. If you were prescribed an antibiotic medicine, use it as told by your health care provider. Do not stop using the antibiotic even if you start to feel better. Incision care Follow instructions from your health care provider about how to take care of your incision areas. Make sure you: Keep your incisions clean and dry. Wash your hands with soap and water before and after applying medicine to the areas, and before and after changing your bandage (dressing).  If soap and water are not available, use hand sanitizer. Change your dressing as told by your health care provider. Leave stitches (sutures), skin glue, or adhesive strips in place. These skin closures may need to stay in place for 2 weeks or longer. If adhesive strip edges start to loosen and curl up, you may trim the loose edges. Do not remove adhesive strips completely unless your health care provider tells you to do that. Do not wear tight clothing over the incisions. Tight clothing may rub and irritate the incision areas, which may cause the incisions to open. Do not take baths, swim, or use a hot tub until your health care provider approves. Ask your health care provider if you can take showers. You may only be allowed to take sponge baths for bathing. Check your incision area every day for signs of infection. Check for:  More redness, swelling, or pain. More fluid or blood. Warmth. Pus or a bad smell. Activity Avoid lifting anything that is heavier than 10 lb (4.5 kg) for 2 weeks or until your health care provider says it is okay. You may resume normal activities as told by your health care provider. Ask your health care provider what activities are safe for you. Take rest breaks during the day as needed. Eating and drinking Follow instructions from your health care provider about what you can eat after surgery. To prevent or treat constipation while you are taking prescription pain medicine, your health care provider may recommend that you: Drink enough fluid to keep your urine clear or pale yellow. Take over-the-counter or prescription medicines. Eat foods that are high in fiber, such as fresh fruits and vegetables, whole grains, and beans. Limit foods that are high in fat and processed sugars, such as fried and sweet foods. General instructions Ask your health care provider when you will need an appointment to get your sutures or staples removed. Keep all follow-up visits as told by  your health care provider. This is important. Contact a health care provider if: You have more redness, swelling, or pain around your incisions. You have more fluid or blood coming from the incisions. Your incisions feel warm to the touch. You have pus or a bad smell coming from your incisions or your dressing. You have a fever. You have an incision that breaks open (edges not staying together) after sutures or staples have been removed. Get help right away if: You develop a rash. You have chest pain or difficulty breathing. You have pain or swelling in your legs. You feel light-headed or you faint. Your abdomen swells (becomes distended). You have nausea or vomiting. You have blood in your stool (feces). This information is not intended to replace advice given to you by your health care provider. Make sure you discuss any questions you have with your health care provider. Document Released: 06/12/2005 Document Revised: 08/24/2016 Document Reviewed: 08/24/2016 Elsevier Interactive Patient Education  [employer].

## 2022-09-15 ENCOUNTER — Telehealth: Payer: Self-pay | Admitting: Surgery

## 2022-09-15 LAB — CEA: CEA: 2.8 ng/mL (ref 0.0–4.7)

## 2022-09-15 NOTE — Telephone Encounter (Signed)
Patient has been advised of Pre-Admission date/time, and Surgery date at [facility].  Surgery Date: 09/24/22 Preadmission Testing Date: 09/18/22 (phone 1p-5p)  Patient has been made aware to call [phone], between 1-3:00pm the day before surgery, to find out what time to arrive for surgery.

## 2022-09-15 NOTE — Progress Notes (Signed)
Request for Cardiac Clearance has been faxed to Dr Alveria Apley office. The patient will need to stop Eliquis prior to surgery.

## 2022-09-16 NOTE — Progress Notes (Signed)
Patient ID: HOWARD BUNTE, male   DOB: [birth year], 66 y.o.   MRN: [id number]  HPI HERMINIO KNISKERN is a 66 y.o. male in consultation at the request of Dr. Allen Norris he recently underwent a colonoscopy after his feces were found to have positive FIT.  He completed a colonoscopy showing evidence of partially obstructing large right a sending colon mass in the midportion.  This was tattooed.  Pathology consistent with adenocarcinoma he did have an additional polyp sigmoid colon as well as the transverse colon that was removed.  Pathology shows some fragment of adenocarcinoma in the sigmoid colon.  I had a significant discussion with GI and we both agree that this is likely a cross-contamination from the large colon mass and not another primary.  Patient has a history of A-fib and is on Eliquis.  He has been cardioverted couple times before before.  He is also diabetic but is able to perform more than 4 METS of activity without any shortness of breath or chest pain.  His cardiologist Dr. Mabeline Caras.  No evidence of strokes. BMP and CBC were completely normal.No   Prior abdominal operations. Of note he did have an ultrasound last year showing evidence of cholelithiasis.  He also had a HIDA scan showing no evidence of cholecystitis.  Please note that the patient has had chronic right upper quadrant pains before.  Did have an ultrasound that I personally reviewed showing gallstones but no evidence of liver masses or metastasis.  HPI  Past Medical History:  Diagnosis Date   A-fib ([place])    Diabetes mellitus without complication ([place])    Hypertension     Past Surgical History:  Procedure Laterality Date   CARDIOVERSION N/A 12/22/2018   Procedure: CARDIOVERSION (CATH LAB);  Surgeon: Corey Skains, MD;  Location: [facility] ORS;  Service: Cardiovascular;  Laterality: N/A;   CARDIOVERSION N/A 08/18/2021   Procedure: CARDIOVERSION;  Surgeon: Corey Skains, MD;  Location: [facility] ORS;  Service: Cardiovascular;  Laterality:  N/A;   CARDIOVERSION N/A 03/11/2022   Procedure: CARDIOVERSION;  Surgeon: Corey Skains, MD;  Location: [facility] ORS;  Service: Cardiovascular;  Laterality: N/A;   CARDIOVERSION N/A 03/31/2022   Procedure: CARDIOVERSION;  Surgeon: Corey Skains, MD;  Location: [facility] ORS;  Service: Cardiovascular;  Laterality: N/A;   COLONOSCOPY WITH PROPOFOL N/A 09/08/2022   Procedure: COLONOSCOPY WITH PROPOFOL;  Surgeon: Lucilla Lame, MD;  Location: [facility] ENDOSCOPY;  Service: Endoscopy;  Laterality: N/A;   ELECTROPHYSIOLOGIC STUDY N/A 07/09/2016   Procedure: CARDIOVERSION;  Surgeon: Dionisio David, MD;  Location: [facility] ORS;  Service: Cardiovascular;  Laterality: N/A;   TEE WITHOUT CARDIOVERSION N/A 07/09/2016   Procedure: TRANSESOPHAGEAL ECHOCARDIOGRAM (TEE);  Surgeon: Dionisio David, MD;  Location: [facility] ORS;  Service: Cardiovascular;  Laterality: N/A;    Family History  Problem Relation Age of Onset   Varicose Veins Mother    Heart disease Mother    Diabetes Mother    COPD Father     Social History Social History   Tobacco Use   Smoking status: Never    Passive exposure: Never   Smokeless tobacco: Never  Vaping Use   Vaping Use: Never used  Substance Use Topics   Alcohol use: No   Drug use: No    No Known Allergies  Current Outpatient Medications  Medication Sig Dispense Refill   apixaban (ELIQUIS) 5 MG TABS tablet Take 5 mg by mouth 2 (two) times daily.     bisacodyl (DULCOLAX) 5 MG EC  tablet Take all 4 tablets at 8 am the morning prior to your surgery. 4 tablet 0   Cholecalciferol (VITAMIN D) 50 MCG (2000 UT) CAPS Take 2,000 Units by mouth daily.     Coenzyme Q10 (COQ10) 100 MG CAPS Take 100 mg by mouth daily.     flecainide (TAMBOCOR) 100 MG tablet Take 100 mg by mouth 2 (two) times daily.     glipiZIDE (GLUCOTROL XL) 10 MG 24 hr tablet Take 10 mg by mouth 2 (two) times daily.      metFORMIN (GLUCOPHAGE-XR) 500 MG 24 hr tablet Take 500 mg by mouth daily with breakfast.     metoprolol  (LOPRESSOR) 100 MG tablet Take 100 mg by mouth 2 (two) times daily.     metroNIDAZOLE (FLAGYL) 500 MG tablet Take 2 tablets at 8AM, take 2 tablets at [facility], and take 2 tablets at 8PM the day prior to your surgery 6 tablet 0   Multiple Vitamins-Minerals (MULTIVITAMIN WITH MINERALS) tablet Take 1 tablet by mouth daily with breakfast.      neomycin (MYCIFRADIN) 500 MG tablet Take 2 tablet at 8am, take 2 tablets at 2pm, and take 2 tablets at 8pm the day prior to your surgery 6 tablet 0   Omega-3 Fatty Acids (FISH OIL) 1000 MG CAPS Take 1,000 mg by mouth daily.     OZEMPIC, 1 MG/DOSE, 4 MG/3ML SOPN Inject 1 mg into the skin every Wednesday.     polyethylene glycol powder (MIRALAX) 17 GM/SCOOP powder Mix full container in 64 ounces of Gatorade or other clear liquid. NO Red 238 g 0   Turmeric 400 MG CAPS Take 400 mg by mouth daily.     vitamin B-12 (CYANOCOBALAMIN) 100 MCG tablet Take 500 mcg by mouth daily.     Vitamin E 400 units TABS Take 400 Units by mouth daily.     No current facility-administered medications for this visit.     Review of Systems Full ROS  was asked and was negative except for the information on the HPI  Physical Exam Blood pressure 127/77, pulse (!) 52, temperature 98 F (36.7 C), height '6\' 2"'$  (1.88 m), weight 277 lb (125.6 kg), SpO2 100 %. CONSTITUTIONAL: NAD. EYES: Pupils are equal, round, Sclera are non-icteric. EARS, NOSE, MOUTH AND THROAT: The oral mucosa is pink and moist. Hearing is intact to voice. LYMPH NODES:  Lymph nodes in the neck are normal. RESPIRATORY:  Lungs are clear. There is normal respiratory effort, with equal breath sounds bilaterally, and without pathologic use of accessory muscles. CARDIOVASCULAR: Heart is regular without murmurs, gallops, or rubs. GI: The abdomen is  soft, nontender, and nondistended. There are no palpable masses. There is no hepatosplenomegaly. There are normal bowel sounds in all quadrants. GU: Rectal deferred.    MUSCULOSKELETAL: Normal muscle strength and tone. No cyanosis or edema.   SKIN: Turgor is good and there are no pathologic skin lesions or ulcers. NEUROLOGIC: Motor and sensation is grossly normal. Cranial nerves are grossly intact. PSYCH:  Oriented to person, place and time. Affect is normal.  Data Reviewed  I have personally reviewed the patient's imaging, laboratory findings and medical records.    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 Allen Norris about pathology of the sigmoid polyp being cross-contamination from large descending colon mass. We will continue further work-up with CT  chest abdomen pelvis to appropriately stage him , CEA, CBC and CMP.  I we will see him him back next week for further discussions.  We had an extensive discussion today about colon cancer and about the need for resection.  I do think he will be a good candidate for hand-assisted right colectomy with primary anastomosis.  Procedure discussed with the patient in detail.  Risks, benefits and possible implications including but not limited to: Bleeding, infection injury to incision structures, anastomotic leaks, bile leaks.  We also specifically addressed the gallstones that looking back they do seem to be symptomatic.  Discussed the process and the cons of doing cholecystectomy during the same operative setting of colectomy versus only doing colectomy. I do think that doing a cholecystectomy will not major morbidity and may spare him a second anesthetic and spare him complications from cholelithiasis. He is in agreement. He will also need to hold his Eliquis 48 hours prior to surgery.   I have spent more than 60 minutes in this encounter including personally reviewing imaging studies, coordinating his care, placing orders and having surgical discussion.    Caroleen Hamman, MD  FACS General Surgeon 09/16/2022, 4:14 PM

## 2022-09-17 ENCOUNTER — Ambulatory Visit
Admission: RE | Admit: 2022-09-17 | Discharge: 2022-09-17 | Disposition: A | Discharge status: Home / Self Care | Payer: Medicare Other | Source: Ambulatory Visit | Attending: Surgery | Admitting: Surgery

## 2022-09-17 DIAGNOSIS — C189 Malignant neoplasm of colon, unspecified: Secondary | ICD-10-CM | POA: Diagnosis present

## 2022-09-17 DIAGNOSIS — G8929 Other chronic pain: Secondary | ICD-10-CM | POA: Diagnosis present

## 2022-09-17 DIAGNOSIS — R1011 Right upper quadrant pain: Secondary | ICD-10-CM | POA: Diagnosis present

## 2022-09-17 MED ORDER — IOHEXOL 300 MG/ML  SOLN
100.0000 mL | Freq: Once | INTRAMUSCULAR | Status: AC | PRN
  Administered 2022-09-17: 100 mL via INTRAVENOUS

## 2022-09-17 NOTE — Progress Notes (Unsigned)
Cardiology clearance received from Dr Nehemiah Massed. The patient is cleared at Low risk for surgery. He may hold his Eliquis prior to surgery.

## 2022-09-18 ENCOUNTER — Encounter
Admission: RE | Admit: 2022-09-18 | Discharge: 2022-09-18 | Disposition: A | Discharge status: Home / Self Care | Payer: Medicare Other | Source: Ambulatory Visit | Attending: Surgery | Admitting: Surgery

## 2022-09-18 VITALS — Ht 74.0 in | Wt 277.0 lb

## 2022-09-18 DIAGNOSIS — Z01812 Encounter for preprocedural laboratory examination: Secondary | ICD-10-CM

## 2022-09-18 DIAGNOSIS — E119 Type 2 diabetes mellitus without complications: Secondary | ICD-10-CM

## 2022-09-18 HISTORY — DX: Cardiac arrhythmia, unspecified: I49.9

## 2022-09-18 NOTE — Patient Instructions (Addendum)
Your procedure is scheduled on: Thursday September 24, 2022. Report to Day Surgery inside [facility] 2nd floor, stop by registration desk before getting on elevator.  To find out your arrival time please call [phone] between 1PM - 3PM on Wednesday September 23, 2022.  Remember: Instructions that are not followed completely may result in serious medical risk,  up to and including death, or upon the discretion of your surgeon and anesthesiologist your  surgery may need to be rescheduled.     _X__ 1. Do not eat food after midnight the night before your procedure. Follow your doctors instructions for prep the day before your surgery.                 No chewing gum or hard candies. You may drink clear liquids up to 2 hours                 before you are scheduled to arrive for your surgery- DO not drink clear                 liquids within 2 hours of the start of your surgery.                 Clear Liquids include:  water  __X__2.  On the morning of surgery brush your teeth with toothpaste and water, you                may rinse your mouth with mouthwash if you wish.  Do not swallow any toothpaste or mouthwash.     _X__ 3.  No Alcohol for 24 hours before or after surgery.   _X__ 4.  Do Not Smoke or use e-cigarettes For 24 Hours Prior to Your Surgery.                 Do not use any chewable tobacco products for at least 6 hours prior to                 Surgery.  _X__  5.  Do not use any recreational drugs (marijuana, cocaine, heroin, ecstasy, MDMA or other)                For at least one week prior to your surgery.  Combination of these drugs with anesthesia                May have life threatening results.  ____  6.  Bring all medications with you on the day of surgery if instructed.   __X__  7.  Notify your doctor if there is any change in your medical condition      (cold, fever, infections).     Do not wear jewelry, make-up, hairpins, clips or nail  polish. Do not wear lotions, powders, or perfumes. You may wear deodorant. Do not shave 48 hours prior to surgery. Men may shave face and neck. Do not bring valuables to the hospital.    [facility] is not responsible for any belongings or valuables.  Contacts, dentures or bridgework may not be worn into surgery. Leave your suitcase in the car. After surgery it may be brought to your room. For patients admitted to the hospital, discharge time is determined by your treatment team.   Patients discharged the day of surgery will not be allowed to drive home.   Make arrangements for someone to be with you for the first 24 hours of your Same Day Discharge.  __X__ Take these medicines the  morning of surgery with A SIP OF WATER:    1. flecainide (TAMBOCOR) 100 MG  2. metoprolol (LOPRESSOR) 100 MG tablet  3.   4.  5.  6.  ____ Fleet Enema (as directed)   __X__ Use CHG Soap (or wipes) as directed  ____ Use Benzoyl Peroxide Gel as instructed  ____ Use inhalers on the day of surgery  __X__ Stop metformin 2 days prior to surgery (take last dose Monday 09/21/22) (Has already stopped this medication)   __X__ Stop OZEMPIC, 1 MG/DOSE, 4 MG/3ML SOPN 1 week before surgery. (Has already stopped this medication)          __X__ Stop apixaban (ELIQUIS) 5 MG TABS 2 days prior to surgery (take last dose Monday 09/21/22)  __X__ One Week prior to surgery- Stop Anti-inflammatories such as Ibuprofen, Aleve, Advil, Motrin, meloxicam (MOBIC), diclofenac, etodolac, ketorolac, Toradol, Daypro, piroxicam, Goody's or BC powders. OK TO USE TYLENOL IF NEEDED   __X__ One week prior to surgery STOP supplements until after surgery. Omega-3 Fatty Acids (FISH OIL), Turmeric, Vitamin E   ____ Bring C-Pap to the hospital.    If you have any questions regarding your pre-procedure instructions,  Please call Pre-admit Testing at [phone]

## 2022-09-21 ENCOUNTER — Ambulatory Visit (INDEPENDENT_AMBULATORY_CARE_PROVIDER_SITE_OTHER): Payer: Medicare Other | Admitting: Surgery

## 2022-09-21 ENCOUNTER — Encounter: Payer: Self-pay | Admitting: Surgery

## 2022-09-21 ENCOUNTER — Encounter
Admission: RE | Admit: 2022-09-21 | Discharge: 2022-09-21 | Disposition: A | Discharge status: Home / Self Care | Payer: Medicare Other | Source: Ambulatory Visit | Attending: Surgery | Admitting: Surgery

## 2022-09-21 VITALS — BP 135/66 | HR 51 | Temp 97.6°F | Wt 275.8 lb

## 2022-09-21 DIAGNOSIS — Z01812 Encounter for preprocedural laboratory examination: Secondary | ICD-10-CM | POA: Insufficient documentation

## 2022-09-21 DIAGNOSIS — C189 Malignant neoplasm of colon, unspecified: Secondary | ICD-10-CM | POA: Diagnosis not present

## 2022-09-21 DIAGNOSIS — K8020 Calculus of gallbladder without cholecystitis without obstruction: Secondary | ICD-10-CM | POA: Diagnosis not present

## 2022-09-21 NOTE — Patient Instructions (Addendum)
We have discussed removing a portion of your damaged colon through 4 small incisions today. We have scheduled this surgery for 09/24/22 at [facility] with Dr. Dahlia Byes. Please plan a hospital stay of 3-7 days for surgery and recovery time.  We will have you complete a bowel prep prior to your surgery. Please see information provided.  You have also been given a (Blue) Pre-Care Sheet with more information regarding your particular surgery. Our surgery scheduler will call you to verify surgery date and to go over information.  Please review all information given.  Hold Eliquis 48 hours prior to surgery, last dose will be on 09/21/22.  You will need to arrange to be out of work for approximately 2 weeks and then you may return with a lifting restriction for 4 more weeks. If you have FMLA or Disability paperwork that needs to be filled out, please have your company fax your paperwork to [phone] or you may drop this by either office. This paperwork will be filled out within 3 days after your surgery has been completed.  Please call our office with any questions or concerns prior to your scheduled surgery.   Laparoscopic Colectomy Laparoscopic colectomy is surgery to remove part or all of the large intestine (colon). This procedure may be used to treat several conditions, including: Inflammation and infection of the colon (diverticulitis). Tumors or masses in the colon. Inflammatory bowel disease, such as Crohn disease or ulcerative colitis. Colectomy is an option when symptoms cannot be controlled with medicines. Bleeding from the colon that cannot be controlled by another method. Blockage or obstruction of the colon.  Tell a health care provider about: Any allergies you have. All medicines you are taking, including vitamins, herbs, eye drops, creams, and over-the-counter medicines. Any problems you or family members have had with anesthetic medicines. Any blood disorders you have. Any surgeries  you have had. Any medical conditions you have. What are the risks? Generally, this is a safe procedure. However, problems may occur, including: Infection. Bleeding. Allergic reactions to medicines or dyes. Damage to other structures or organs. Leaking from where the colon was sewn together. Future blockage of the small intestines from scar tissue. Another surgery may be needed to repair this. Needing to convert to an open procedure. Complications such as damage to other organs or excessive bleeding may require the surgeon to convert from a laparoscopic procedure to an open procedure. This involves making a larger incision in the abdomen.  What happens before the procedure? Staying hydrated Follow instructions from your health care provider about hydration, which may include: Up to 2 hours before the procedure - you may continue to drink clear liquids, such as water, clear fruit juice, black coffee, and plain tea. Medicines Ask your health care provider about: Changing or stopping your regular medicines. This is especially important if you are taking diabetes medicines or blood thinners. Taking medicines such as aspirin and ibuprofen. These medicines can thin your blood. Do not take these medicines before your procedure if your health care provider instructs you not to. You may be given antibiotic medicine to clean out bacteria from your colon. Follow the directions carefully and take the medicine at the correct time. General instructions You may be prescribed an oral bowel prep to clean out your colon in preparation for the surgery: Follow instructions from your health care provider about how to do this. Do not eat or drink anything else after you have started the bowel prep, unless your health care  provider tells you it is safe to do so. Do not use any products that contain nicotine or tobacco, such as cigarettes and e-cigarettes. If you need help quitting, ask your health care  provider. What happens during the procedure? To reduce your risk of infection: Your health care team will wash or sanitize their hands. Your skin will be washed with soap. An IV tube will be inserted into one of your veins to deliver fluid and medication. You will be given one of the following: A medicine to help you relax (sedative). A medicine to make you fall asleep (general anesthetic). Small monitors will be connected to your body. They will be used to check your heart, blood pressure, and oxygen level. A breathing tube may be placed into your lungs during the procedure. A thin, flexible tube (catheter) will be placed into your bladder to drain urine. A tube may be placed through your nose and into your stomach to drain stomach fluids (nasogastric tube, or NG tube). Your abdomen will be filled with air so it expands. This gives the surgeon more room to operate and makes your organs easier to see. Several small cuts (incisions) will be made in your abdomen. A thin, lighted tube with a tiny camera on the end (laparoscope) will be put through one of the small incisions. The camera on the laparoscope will send a picture to a computer screen in the operating room. This will give the surgeon a good view inside your abdomen. Hollow tubes will be put through the other small incisions in your abdomen. The tools that are needed for the procedure will be put through these tubes. Clamps or staples will be put on both ends of the diseased part of the colon. The part of the intestine between the clamps or staples will be removed. If possible, the ends of the healthy colon that remain will be stitched (sutured) or stapled together to allow your body to pass waste (stool). Sometimes, the remaining colon cannot be stitched back together. If this is the case, a colostomy will be needed. If you need a colostomy: An opening to the outside of your body (stoma) will be made through your abdomen. The end of your  colon will be brought to the opening. It will be stitched to the skin. A bag will be attached to the opening. Stool will drain into this removable bag. The colostomy may be temporary or permanent. The incisions from the colectomy will be closed with sutures or staples. The procedure may vary among health care providers and hospitals. What happens after the procedure? Your blood pressure, heart rate, breathing rate, and blood oxygen level will be monitored until the medicines you were given have worn off. You will receive fluids through an IV tube until your bowels start to work properly. Once your bowels are working again, you will be given clear liquids first and then solid food as tolerated. You will be given medicines to control your pain and nausea, if needed. Do not drive for 24 hours if you were given a sedative. This information is not intended to replace advice given to you by your health care provider. Make sure you discuss any questions you have with your health care provider. Document Released: 02/13/2003 Document Revised: 08/24/2016 Document Reviewed: 08/24/2016 Elsevier Interactive Patient Education  2018 [employer].     Laparoscopic Colectomy, Care After This sheet gives you information about how to care for yourself after your procedure. Your health care provider may also give  you more specific instructions. If you have problems or questions, contact your health care provider. What can I expect after the procedure? After your procedure, it is common to have the following: Pain in your abdomen, especially in the incision areas. You will be given medicine to control the pain. Tiredness. This is a normal part of the recovery process. Your energy level will return to normal over the next several weeks. Changes in your bowel movements, such as constipation or needing to go more often. Talk with your health care provider about how to manage this.  Follow these instructions at  home: Medicines Take over-the-counter and prescription medicines only as told by your health care provider. Do not drive or use heavy machinery while taking prescription pain medicine. Do not drink alcohol while taking prescription pain medicine. If you were prescribed an antibiotic medicine, use it as told by your health care provider. Do not stop using the antibiotic even if you start to feel better. Incision care Follow instructions from your health care provider about how to take care of your incision areas. Make sure you: Keep your incisions clean and dry. Wash your hands with soap and water before and after applying medicine to the areas, and before and after changing your bandage (dressing). If soap and water are not available, use hand sanitizer. Change your dressing as told by your health care provider. Leave stitches (sutures), skin glue, or adhesive strips in place. These skin closures may need to stay in place for 2 weeks or longer. If adhesive strip edges start to loosen and curl up, you may trim the loose edges. Do not remove adhesive strips completely unless your health care provider tells you to do that. Do not wear tight clothing over the incisions. Tight clothing may rub and irritate the incision areas, which may cause the incisions to open. Do not take baths, swim, or use a hot tub until your health care provider approves. Ask your health care provider if you can take showers. You may only be allowed to take sponge baths for bathing. Check your incision area every day for signs of infection. Check for: More redness, swelling, or pain. More fluid or blood. Warmth. Pus or a bad smell. Activity Avoid lifting anything that is heavier than 10 lb (4.5 kg) for 2 weeks or until your health care provider says it is okay. You may resume normal activities as told by your health care provider. Ask your health care provider what activities are safe for you. Take rest breaks during the day  as needed. Eating and drinking Follow instructions from your health care provider about what you can eat after surgery. To prevent or treat constipation while you are taking prescription pain medicine, your health care provider may recommend that you: Drink enough fluid to keep your urine clear or pale yellow. Take over-the-counter or prescription medicines. Eat foods that are high in fiber, such as fresh fruits and vegetables, whole grains, and beans. Limit foods that are high in fat and processed sugars, such as fried and sweet foods. General instructions Ask your health care provider when you will need an appointment to get your sutures or staples removed. Keep all follow-up visits as told by your health care provider. This is important. Contact a health care provider if: You have more redness, swelling, or pain around your incisions. You have more fluid or blood coming from the incisions. Your incisions feel warm to the touch. You have pus or a bad smell coming  from your incisions or your dressing. You have a fever. You have an incision that breaks open (edges not staying together) after sutures or staples have been removed. Get help right away if: You develop a rash. You have chest pain or difficulty breathing. You have pain or swelling in your legs. You feel light-headed or you faint. Your abdomen swells (becomes distended). You have nausea or vomiting. You have blood in your stool (feces).   Laparoscopic Cholecystectomy Laparoscopic cholecystectomy is surgery to remove the gallbladder. The gallbladder is located in the upper right part of the abdomen, behind the liver. It is a storage sac for bile, which is produced in the liver. Bile aids in the digestion and absorption of fats. Cholecystectomy is often done for inflammation of the gallbladder (cholecystitis). This condition is usually caused by a buildup of gallstones (cholelithiasis) in the gallbladder. Gallstones can block the  flow of bile, and that can result in inflammation and pain. In severe cases, emergency surgery may be required. If emergency surgery is not required, you will have time to prepare for the procedure. Laparoscopic surgery is an alternative to open surgery. Laparoscopic surgery has a shorter recovery time. Your common bile duct may also need to be examined during the procedure. If stones are found in the common bile duct, they may be removed. LET [facility] CARE PROVIDER KNOW ABOUT: Any allergies you have. All medicines you are taking, including vitamins, herbs, eye drops, creams, and over-the-counter medicines. Previous problems you or members of your family have had with the use of anesthetics. Any blood disorders you have. Previous surgeries you have had.  Any medical conditions you have. RISKS AND COMPLICATIONS Generally, this is a safe procedure. However, problems may occur, including: Infection. Bleeding. Allergic reactions to medicines. Damage to other structures or organs. A stone remaining in the common bile duct. A bile leak from the cyst duct that is clipped when your gallbladder is removed. The need to convert to open surgery, which requires a larger incision in the abdomen. This may be necessary if your surgeon thinks that it is not safe to continue with a laparoscopic procedure. BEFORE THE PROCEDURE Ask your health care provider about: Changing or stopping your regular medicines. This is especially important if you are taking diabetes medicines or blood thinners. Taking medicines such as aspirin and ibuprofen. These medicines can thin your blood. Do not take these medicines before your procedure if your health care provider instructs you not to. Follow instructions from your health care provider about eating or drinking restrictions. Let your health care provider know if you develop a cold or an infection before surgery. Plan to have someone take you home after the  procedure. Ask your health care provider how your surgical site will be marked or identified. You may be given antibiotic medicine to help prevent infection. PROCEDURE To reduce your risk of infection: Your health care team will wash or sanitize their hands. Your skin will be washed with soap. An IV tube may be inserted into one of your veins. You will be given a medicine to make you fall asleep (general anesthetic). A breathing tube will be placed in your mouth. The surgeon will make several small cuts (incisions) in your abdomen. A thin, lighted tube (laparoscope) that has a tiny camera on the end will be inserted through one of the small incisions. The camera on the laparoscope will send a picture to a TV screen (monitor) in the operating room. This will give the  surgeon a good view inside your abdomen. A gas will be pumped into your abdomen. This will expand your abdomen to give the surgeon more room to perform the surgery. Other tools that are needed for the procedure will be inserted through the other incisions. The gallbladder will be removed through one of the incisions. After your gallbladder has been removed, the incisions will be closed with stitches (sutures), staples, or skin glue. Your incisions may be covered with a bandage (dressing). The procedure may vary among health care providers and hospitals. AFTER THE PROCEDURE Your blood pressure, heart rate, breathing rate, and blood oxygen level will be monitored often until the medicines you were given have worn off. You will be given medicines as needed to control your pain.   This information is not intended to replace advice given to you by your health care provider. Make sure you discuss any questions you have with your health care provider.

## 2022-09-21 NOTE — H&P (View-Only) (Signed)
Outpatient Surgical Follow Up  09/21/2022  DYMIR NEESON is an 66 y.o. male.   Chief Complaint  Patient presents with   Follow-up    Pre-op colectomy and lap choley 09/24/22    HPI: LIONAL [name] is a 66 y.o. male following up after he underwent a colonoscopy after his feces were found to have positive FIT.  He completed a colonoscopy showing evidence of partially obstructing large right a sending colon mass in the midportion.  This was tattooed.  Pathology consistent with adenocarcinoma he did have an additional polyp sigmoid colon as well as the transverse colon that was removed.  Pathology shows some fragment of adenocarcinoma in the sigmoid colon.  I had a significant discussion with GI and we both agree that this is likely a cross-contamination from the large colon mass and not another primary.  Patient has a history of A-fib and is on Eliquis.  He has been cardioverted couple times before before.  He is also diabetic but is able to perform more than 4 METS of activity without any shortness of breath or chest pain.  His cardiologist Dr. Mabeline Caras.  No evidence of strokes. BMP and CBC were completely normal.No   Prior abdominal operations. Of note he did have an ultrasound last year showing evidence of cholelithiasis.  He also had a HIDA scan showing no evidence of cholecystitis.  Please note that the patient has had chronic right upper quadrant pains before.  Did have an ultrasound that I personally reviewed showing gallstones but no evidence of liver masses or metastasis. Recent CT shows evidence of know right colon CA w nodal involvement in the mesentery. No distant metastatic disease.  Past Medical History:  Diagnosis Date   A-fib ([place])    Diabetes mellitus without complication ([place])    Dysrhythmia    Hypertension     Past Surgical History:  Procedure Laterality Date   CARDIOVERSION N/A 12/22/2018   Procedure: CARDIOVERSION (CATH LAB);  Surgeon: Corey Skains, MD;  Location:  [facility];  Service: Cardiovascular;  Laterality: N/A;   CARDIOVERSION N/A 08/18/2021   Procedure: CARDIOVERSION;  Surgeon: Corey Skains, MD;  Location: [facility];  Service: Cardiovascular;  Laterality: N/A;   CARDIOVERSION N/A 03/11/2022   Procedure: CARDIOVERSION;  Surgeon: Corey Skains, MD;  Location: [facility];  Service: Cardiovascular;  Laterality: N/A;   CARDIOVERSION N/A 03/31/2022   Procedure: CARDIOVERSION;  Surgeon: Corey Skains, MD;  Location: [facility];  Service: Cardiovascular;  Laterality: N/A;   COLONOSCOPY WITH PROPOFOL N/A 09/08/2022   Procedure: COLONOSCOPY WITH PROPOFOL;  Surgeon: Lucilla Lame, MD;  Location: [facility] ENDOSCOPY;  Service: Endoscopy;  Laterality: N/A;   ELECTROPHYSIOLOGIC STUDY N/A 07/09/2016   Procedure: CARDIOVERSION;  Surgeon: Dionisio David, MD;  Location: [facility];  Service: Cardiovascular;  Laterality: N/A;   TEE WITHOUT CARDIOVERSION N/A 07/09/2016   Procedure: TRANSESOPHAGEAL ECHOCARDIOGRAM (TEE);  Surgeon: Dionisio David, MD;  Location: [facility];  Service: Cardiovascular;  Laterality: N/A;   web fingers repaired     as a child    Family History  Problem Relation Age of Onset   Varicose Veins Mother    Heart disease Mother    Diabetes Mother    COPD Father     Social History:  reports that he has never smoked. He has never been exposed to tobacco smoke. He has never used smokeless tobacco. He reports that he does not drink alcohol and does not use drugs.  Allergies: No Known  Allergies  Medications reviewed.    ROS Full ROS performed and is otherwise negative other than what is stated in HPI   BP 135/66   Pulse (!) 51   Temp 97.6 F (36.4 C) (Oral)   Wt 275 lb 12.8 oz (125.1 kg)   SpO2 99%   BMI 35.41 kg/m   Physical Exam CONSTITUTIONAL: NAD. EYES: Pupils are equal, round, Sclera are non-icteric. EARS, NOSE, MOUTH AND THROAT: The oral mucosa is pink and moist. Hearing is intact to voice. LYMPH NODES:  Lymph nodes in  the neck are normal. RESPIRATORY:  Lungs are clear. There is normal respiratory effort, with equal breath sounds bilaterally, and without pathologic use of accessory muscles. CARDIOVASCULAR: Heart is regular without murmurs, gallops, or rubs. GI: The abdomen is  soft, nontender, and nondistended. There are no palpable masses. There is no hepatosplenomegaly. There are normal bowel sounds in all quadrants. GU: Rectal deferred.   MUSCULOSKELETAL: Normal muscle strength and tone. No cyanosis or edema.   SKIN: Turgor is good and there are no pathologic skin lesions or ulcers. NEUROLOGIC: Motor and sensation is grossly normal. Cranial nerves are grossly intact. PSYCH:  Oriented to person, place and time. Affect is normal.    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 Allen Norris about pathology of the sigmoid polyp being cross-contamination from large descending colon mass. CT seems to have nodal involvement. We had an extensive discussion today about colon cancer and about the need for resection.  I do think he will be a good candidate for hand-assisted right colectomy with primary anastomosis.  Procedure discussed with the patient in detail.  Risks, benefits and possible implications including but not limited to: Bleeding, infection injury to incision structures, anastomotic leaks, bile leaks.  We also specifically addressed the gallstones that looking back they do seem to be symptomatic.  Discussed the process and the cons of doing cholecystectomy during the same operative setting of colectomy versus only doing colectomy. I do think that doing a cholecystectomy will not add major morbidity and may spare him a second anesthetic and spare him complications from cholelithiasis. He is in agreement.    I have spent more than 40 minutes in this encounter including  personally reviewing imaging studies, coordinating his care, placing orders and having surgical discussion.  Caroleen Hamman, MD [facility] General Surgeon

## 2022-09-21 NOTE — Progress Notes (Signed)
Outpatient Surgical Follow Up  09/21/2022  Nicolas Zuniga is an 66 y.o. male.   Chief Complaint  Patient presents with   Follow-up    Pre-op colectomy and lap choley 09/24/22    HPI: Nicolas Zuniga is a 66 y.o. male following up after he underwent a colonoscopy after his feces were found to have positive FIT.  He completed a colonoscopy showing evidence of partially obstructing large right a sending colon mass in the midportion.  This was tattooed.  Pathology consistent with adenocarcinoma he did have an additional polyp sigmoid colon as well as the transverse colon that was removed.  Pathology shows some fragment of adenocarcinoma in the sigmoid colon.  I had a significant discussion with GI and we both agree that this is likely a cross-contamination from the large colon mass and not another primary.  Patient has a history of A-fib and is on Eliquis.  He has been cardioverted couple times before before.  He is also diabetic but is able to perform more than 4 METS of activity without any shortness of breath or chest pain.  His cardiologist Dr. Mabeline Caras.  No evidence of strokes. BMP and CBC were completely normal.No   Prior abdominal operations. Of note he did have an ultrasound last year showing evidence of cholelithiasis.  He also had a HIDA scan showing no evidence of cholecystitis.  Please note that the patient has had chronic right upper quadrant pains before.  Did have an ultrasound that I personally reviewed showing gallstones but no evidence of liver masses or metastasis. Recent CT shows evidence of know right colon CA w nodal involvement in the mesentery. No distant metastatic disease.  Past Medical History:  Diagnosis Date   A-fib ([place])    Diabetes mellitus without complication ([place])    Dysrhythmia    Hypertension     Past Surgical History:  Procedure Laterality Date   CARDIOVERSION N/A 12/22/2018   Procedure: CARDIOVERSION (CATH LAB);  Surgeon: Corey Skains, MD;  Location:  [facility];  Service: Cardiovascular;  Laterality: N/A;   CARDIOVERSION N/A 08/18/2021   Procedure: CARDIOVERSION;  Surgeon: Corey Skains, MD;  Location: [facility];  Service: Cardiovascular;  Laterality: N/A;   CARDIOVERSION N/A 03/11/2022   Procedure: CARDIOVERSION;  Surgeon: Corey Skains, MD;  Location: [facility];  Service: Cardiovascular;  Laterality: N/A;   CARDIOVERSION N/A 03/31/2022   Procedure: CARDIOVERSION;  Surgeon: Corey Skains, MD;  Location: [facility];  Service: Cardiovascular;  Laterality: N/A;   COLONOSCOPY WITH PROPOFOL N/A 09/08/2022   Procedure: COLONOSCOPY WITH PROPOFOL;  Surgeon: Lucilla Lame, MD;  Location: [facility] ENDOSCOPY;  Service: Endoscopy;  Laterality: N/A;   ELECTROPHYSIOLOGIC STUDY N/A 07/09/2016   Procedure: CARDIOVERSION;  Surgeon: Dionisio David, MD;  Location: [facility];  Service: Cardiovascular;  Laterality: N/A;   TEE WITHOUT CARDIOVERSION N/A 07/09/2016   Procedure: TRANSESOPHAGEAL ECHOCARDIOGRAM (TEE);  Surgeon: Dionisio David, MD;  Location: [facility];  Service: Cardiovascular;  Laterality: N/A;   web fingers repaired     as a child    Family History  Problem Relation Age of Onset   Varicose Veins Mother    Heart disease Mother    Diabetes Mother    COPD Father     Social History:  reports that he has never smoked. He has never been exposed to tobacco smoke. He has never used smokeless tobacco. He reports that he does not drink alcohol and does not use drugs.  Allergies: No Known  Allergies  Medications reviewed.    ROS Full ROS performed and is otherwise negative other than what is stated in HPI   BP 135/66   Pulse (!) 51   Temp 97.6 F (36.4 C) (Oral)   Wt 275 lb 12.8 oz (125.1 kg)   SpO2 99%   BMI 35.41 kg/m   Physical Exam CONSTITUTIONAL: NAD. EYES: Pupils are equal, round, Sclera are non-icteric. EARS, NOSE, MOUTH AND THROAT: The oral mucosa is pink and moist. Hearing is intact to voice. LYMPH NODES:  Lymph nodes in  the neck are normal. RESPIRATORY:  Lungs are clear. There is normal respiratory effort, with equal breath sounds bilaterally, and without pathologic use of accessory muscles. CARDIOVASCULAR: Heart is regular without murmurs, gallops, or rubs. GI: The abdomen is  soft, nontender, and nondistended. There are no palpable masses. There is no hepatosplenomegaly. There are normal bowel sounds in all quadrants. GU: Rectal deferred.   MUSCULOSKELETAL: Normal muscle strength and tone. No cyanosis or edema.   SKIN: Turgor is good and there are no pathologic skin lesions or ulcers. NEUROLOGIC: Motor and sensation is grossly normal. Cranial nerves are grossly intact. PSYCH:  Oriented to person, place and time. Affect is normal.    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 Allen Norris about pathology of the sigmoid polyp being cross-contamination from large descending colon mass. CT seems to have nodal involvement. We had an extensive discussion today about colon cancer and about the need for resection.  I do think he will be a good candidate for hand-assisted right colectomy with primary anastomosis.  Procedure discussed with the patient in detail.  Risks, benefits and possible implications including but not limited to: Bleeding, infection injury to incision structures, anastomotic leaks, bile leaks.  We also specifically addressed the gallstones that looking back they do seem to be symptomatic.  Discussed the process and the cons of doing cholecystectomy during the same operative setting of colectomy versus only doing colectomy. I do think that doing a cholecystectomy will not add major morbidity and may spare him a second anesthetic and spare him complications from cholelithiasis. He is in agreement.    I have spent more than 40 minutes in this encounter including  personally reviewing imaging studies, coordinating his care, placing orders and having surgical discussion.  Caroleen Hamman, MD [facility] General Surgeon

## 2022-09-22 ENCOUNTER — Encounter: Payer: Self-pay | Admitting: Surgery

## 2022-09-22 NOTE — Progress Notes (Signed)
Perioperative Services  Pre-Admission/Anesthesia Testing Clinical Review  Date: 09/22/22  Patient Demographics:  Name: Nicolas Zuniga DOB:   1956/11/10 MRN:   [id number]  Planned Surgical Procedure(s):    Case: [id number] Date/Time: 09/24/22 1210   Procedures:      LAPAROSCOPIC RIGHT COLECTOMY, RNFA to assist     LAPAROSCOPIC CHOLECYSTECTOMY   Anesthesia type: General   Pre-op diagnosis: Colon cancer, gallstones, chronic abdominal pain   Location: [facility] OR ROOM 06 / [facility]   Surgeons: Jules Husbands, MD   NOTE: Available PAT nursing documentation and vital signs have been reviewed. Clinical nursing staff has updated patient's PMH/PSHx, current medication list, and drug allergies/intolerances to ensure comprehensive history available to assist in medical decision making as it pertains to the aforementioned surgical procedure and anticipated anesthetic course. Extensive review of available clinical information performed. [place] PMH and PSHx updated with any diagnoses/procedures that  may have been inadvertently omitted during his intake with the pre-admission testing department's nursing staff.  Clinical Discussion:  Nicolas Zuniga is a 66 y.o. male who is submitted for pre-surgical anesthesia review and clearance prior to him undergoing the above procedure. Patient has never been a smoker. Pertinent PMH includes: CAD, PAF, diastolic dysfunction, HTN, T2DM, cholelithiasis, chronic gastritis, adenocarcinoma the colon.  Patient is followed by cardiology Nehemiah Massed, MD). He was last seen in the cardiology clinic on 05/20/2022; notes reviewed.  At the time of his clinic visit, patient doing well overall from a cardiovascular perspective.  He denied any significant cardiovascular symptoms.  No episodes of chest pain, shortness of breath, PND, orthopnea, palpitations, significant peripheral edema, vertiginous symptoms, or presyncope/syncope.  Patient with a past medical history  significant for cardiovascular diagnoses, including atrial fibrillation.  Patient has undergone multiple DCCV procedures in the past for treatment of his atrial dysrhythmia.  DCCV procedures performed as follows: 07/09/2016 (100 J x 1), 12/22/2018 (120 J x 1), 08/18/2021 (120 J x 1) 03/11/2022 (120 J x 1), and 03/31/2022 (120 J x 1).  Procedures all resulted in restoration of normal sinus rhythm, however atrial arrhythmia has been refractory, thus requiring repeat procedures.  Most recent TTE was performed on 04/09/2022 revealing a normal left ventricular systolic function with EF of >55%. Left ventricular diastolic Doppler parameters consistent with abnormal relaxation (G1DD).  There was trivial tricuspid valve regurgitation.  There was no evidence of a significant transvalvular gradient to suggest stenosis.  CHA2DS2-VASc Score = 3 (age, HTN, T2DM). Patient's rate and rhythm currently being maintained on oral flecainide + metoprolol succinate.  He is chronically anticoagulated using standard dose apixaban.  Patient reportedly compliant with prescribed anticoagulation therapy with no evidence or reports of GI bleeding.  Blood pressure well controlled at 120/72 mmHg on prescribed beta-blocker (metoprolol succinate) monotherapy.  Patient on omega-3 fatty acid for ASCVD prevention.  T2DM reasonably controlled on currently prescribed regimen; last HgbA1c was 7.2% when checked on 04/15/2022.  Patient does not have an OSAH diagnosis. Functional capacity, as defined by DASI, is documented as being >/= 4 METS.  No changes were made to his medication regimen.  Patient to follow-up with outpatient cardiology in 9 months or sooner if needed.  Nicolas Zuniga recently underwent routine colonoscopy on 09/08/2022 which revealed multiple colonic polyps as well as large mass within the ascending colon.  Areas were biopsied and pathology (+) for adenocarcinoma.  Patient has met with general surgery Dahlia Byes, MD), and following  consultation to discuss clinical recommendations/treatment options, he has  elected to undergo surgical resection via [place].  Additionally, patient with known cholelithiasis and complaints of chronic abdominal pain.  Patient will also undergo LAPAROSCOPIC CHOLECYSTECTOMY.  Procedures are currently scheduled for 09/24/2022.  Given patient's past medical history significant for cardiovascular diagnoses, presurgical cardiac clearance was sought by the PAT team. Per cardiology, "this patient is optimized for surgery and may proceed with the planned procedural course with a LOW risk of significant perioperative cardiovascular complications".  Again, this patient is on daily anticoagulation therapy.  He has been instructed on recommendations from his cardiologist for holding his apixaban dose for 2 days prior to his procedure with plans to restart since postoperative bleeding risk to be minimized by his primary attending surgeon.  Patient is aware that his last dose of apixaban should be on 09/21/2022.  Patient denies previous perioperative complications with anesthesia in the past. In review of the available records, it is noted that patient underwent a general anesthetic course here at [facility] ([facility]A [facility]) in 09/2022 without documented complications.      09/21/2022    1:55 PM 09/18/2022    3:12 PM 09/14/2022    1:27 PM  Vitals with BMI  Height  '6\' 2"'$  '6\' 2"'$   Weight 275 lbs 13 oz 277 lbs 277 lbs  BMI 35.4 16.10 96.04  Systolic 540  981  Diastolic 66  77  Pulse 51  52    Providers/Specialists:   NOTE: Primary physician provider listed below. Patient may have been seen by APP or partner within same practice.   PROVIDER ROLE / SPECIALTY LAST Suszanne Finch, MD General Surgery (Surgeon) 09/21/2022  Hortencia Pilar, MD Primary Care Provider 04/15/2022  Serafina Royals, MD Cardiology 05/20/2022   Allergies:  Patient has no known  allergies.  Current Home Medications:   No current facility-administered medications for this encounter.    apixaban (ELIQUIS) 5 MG TABS tablet   bisacodyl (DULCOLAX) 5 MG EC tablet   Cholecalciferol (VITAMIN D) 50 MCG (2000 UT) CAPS   Coenzyme Q10 (COQ10) 100 MG CAPS   flecainide (TAMBOCOR) 100 MG tablet   glipiZIDE (GLUCOTROL XL) 10 MG 24 hr tablet   metFORMIN (GLUCOPHAGE-XR) 500 MG 24 hr tablet   metoprolol (LOPRESSOR) 100 MG tablet   metroNIDAZOLE (FLAGYL) 500 MG tablet   Multiple Vitamins-Minerals (MULTIVITAMIN WITH MINERALS) tablet   neomycin (MYCIFRADIN) 500 MG tablet   Omega-3 Fatty Acids (FISH OIL) 1000 MG CAPS   OZEMPIC, 1 MG/DOSE, 4 MG/3ML SOPN   polyethylene glycol powder (MIRALAX) 17 GM/SCOOP powder   Turmeric 400 MG CAPS   vitamin B-12 (CYANOCOBALAMIN) 100 MCG tablet   Vitamin E 400 units TABS   History:   Past Medical History:  Diagnosis Date   Adenocarcinoma of colon ([place]) 09/08/2022   Cholelithiasis    Chronic gastritis    Coronary artery calcification seen on CT scan    Diastolic dysfunction    a.) TTE 04/09/2022: EF >55%, triv TR, G1DD   Diverticulosis    Hypertension    Long term current use of anticoagulant    a.) apixaban   Nephrolithiasis    Paroxysmal A-fib ([place])    a.) CHA2DS2VASc = 3 (age, HTN, T2DM);  b.) s/p DCCV 07/09/2016 (100 J x1), 12/22/2018 (120 J x1), 08/18/2021 (120 J x1), 03/11/2022 (120 J x1), 03/31/2022 (120 J x1); c.) rate/rhythm maintained on oral flecanide + metoprolol succinate; chronically anticoagulated with apixaban   T2DM (type 2 diabetes mellitus) ([place])  Past Surgical History:  Procedure Laterality Date   CARDIOVERSION N/A 12/22/2018   Procedure: CARDIOVERSION (CATH LAB);  Surgeon: Corey Skains, MD;  Location: [facility] ORS;  Service: Cardiovascular;  Laterality: N/A;   CARDIOVERSION N/A 08/18/2021   Procedure: CARDIOVERSION;  Surgeon: Corey Skains, MD;  Location: [facility] ORS;  Service: Cardiovascular;  Laterality:  N/A;   CARDIOVERSION N/A 03/11/2022   Procedure: CARDIOVERSION;  Surgeon: Corey Skains, MD;  Location: [facility] ORS;  Service: Cardiovascular;  Laterality: N/A;   CARDIOVERSION N/A 03/31/2022   Procedure: CARDIOVERSION;  Surgeon: Corey Skains, MD;  Location: [facility] ORS;  Service: Cardiovascular;  Laterality: N/A;   COLONOSCOPY WITH PROPOFOL N/A 09/08/2022   Procedure: COLONOSCOPY WITH PROPOFOL;  Surgeon: Lucilla Lame, MD;  Location: [facility] ENDOSCOPY;  Service: Endoscopy;  Laterality: N/A;   ELECTROPHYSIOLOGIC STUDY N/A 07/09/2016   Procedure: CARDIOVERSION;  Surgeon: Dionisio David, MD;  Location: [facility] ORS;  Service: Cardiovascular;  Laterality: N/A;   TEE WITHOUT CARDIOVERSION N/A 07/09/2016   Procedure: TRANSESOPHAGEAL ECHOCARDIOGRAM (TEE);  Surgeon: Dionisio David, MD;  Location: [facility] ORS;  Service: Cardiovascular;  Laterality: N/A;   web fingers repaired     as a child   Family History  Problem Relation Age of Onset   Varicose Veins Mother    Heart disease Mother    Diabetes Mother    COPD Father    Social History   Tobacco Use   Smoking status: Never    Passive exposure: Never   Smokeless tobacco: Never  Vaping Use   Vaping Use: Never used  Substance Use Topics   Alcohol use: No   Drug use: No    Pertinent Clinical Results:  LABS: Labs reviewed: Acceptable for surgery.  Lab Results  Component Value Date   WBC 11.0 (H) 09/14/2022   HGB 11.1 (L) 09/14/2022   HCT 35.7 (L) 09/14/2022   MCV 77.8 (L) 09/14/2022   PLT 269 09/14/2022   Lab Results  Component Value Date   NA 135 09/14/2022   K 4.1 09/14/2022   CO2 23 09/14/2022   GLUCOSE 258 (H) 09/14/2022   BUN 12 09/14/2022   CREATININE 1.06 09/14/2022   CALCIUM 9.9 09/14/2022   GFRNONAA >60 09/14/2022   Hospital Outpatient Visit on 09/21/2022  Component Date Value Ref Range Status   ABO/RH(D) 09/21/2022 O NEG   Final   Antibody Screen 09/21/2022 NEG   Final   Sample Expiration 09/21/2022 10/05/2022,2359    Final   Extend sample reason 09/21/2022    Final                   Value:NO TRANSFUSIONS OR PREGNANCY IN THE PAST 3 MONTHS Performed at [facility] [place], [place] [address]     ECG: Date: 04/07/2022 Rate: 67 bpm Rhythm:  Normal sinus rhythm; nonspecific IVCD Axis (leads I and aVF): Normal Intervals: QRS 122 ms. QTc 429 ms. ST segment and T wave changes: No evidence of acute ST segment elevation or depression Comparison: Previous tracing obtained on 03/24/2022 showed atrial flutter.   NOTE: Tracing obtained at [facility]; unable for review. Above based on cardiologist's interpretation.    IMAGING / PROCEDURES: CT CHEST, ABDOMEN,  PELVIS W CONTRAST performed on 09/17/2022 Interval concentric mass involving the right colon, ileocecal valve and distal terminal ileum, compatible with the patient's known colon carcinoma. No associated obstruction. Adjacent mass with central low density, compatible with local extension of necrotic tumor or an enlarged, necrotic metastatic  lymph node. Multiple adjacent enlarged mesenteric lymph nodes medial to these masses, compatible with metastatic adenopathy. Colonic diverticulosis. Cholelithiasis. 4 mm mean diameter left lower lobe nodule possible 3 mm mean diameter right middle lobe nodule. These are felt to have a low likelihood of representing metastatic disease and could be followed on subsequent examinations.  TRANSTHORACIC ECHOCARDIOGRAM performed on 04/09/2022 Normal left ventricular systolic function with an EF of >55% Left ventricular diastolic Doppler parameters consistent with abnormal relaxation (G1DD). Normal right ventricular systolic function Trivial TR Normal transvalvular gradients; no valvular stenosis No pericardial effusion  Impression and Plan:  Nicolas Zuniga has been referred for pre-anesthesia review and clearance prior to him undergoing the planned anesthetic and procedural courses. Available  labs, pertinent testing, and imaging results were personally reviewed by me. This patient has been appropriately cleared by cardiology with an overall LOW risk of significant perioperative cardiovascular complications.  Based on clinical review performed today (09/22/22), barring any significant acute changes in the patient's overall condition, it is anticipated that he will be able to proceed with the planned surgical intervention. Any acute changes in clinical condition may necessitate his procedure being postponed and/or cancelled. Patient will meet with anesthesia team (MD and/or CRNA) on the day of his procedure for preoperative evaluation/assessment. Questions regarding anesthetic course will be fielded at that time.   Pre-surgical instructions were reviewed with the patient during his PAT appointment and questions were fielded by PAT clinical staff. Patient was advised that if any questions or concerns arise prior to his procedure then he should return a call to PAT and/or his surgeon's office to discuss.  Honor Loh, MSN, APRN, FNP-C, CEN [facility]  Peri-operative Services Nurse Practitioner Phone: [phone] Fax: [phone] 09/22/22 12:02 PM  NOTE: This note has been prepared using Dragon dictation software. Despite my best ability to proofread, there is always the potential that unintentional transcriptional errors may still occur from this process.

## 2022-09-24 ENCOUNTER — Inpatient Hospital Stay: Payer: Medicare Other | Admitting: Urgent Care

## 2022-09-24 ENCOUNTER — Other Ambulatory Visit: Payer: Self-pay

## 2022-09-24 ENCOUNTER — Inpatient Hospital Stay
Admission: RE | Admit: 2022-09-24 | Discharge: 2022-10-19 | DRG: 329 | Disposition: A | Discharge status: Skilled Nursing Facility | Payer: Medicare Other | Attending: Surgery | Admitting: Surgery

## 2022-09-24 ENCOUNTER — Encounter: Payer: Self-pay | Admitting: Surgery

## 2022-09-24 ENCOUNTER — Encounter
Admission: RE | Disposition: A | Discharge status: Skilled Nursing Facility | Payer: Self-pay | Source: Home / Self Care | Attending: Surgery

## 2022-09-24 DIAGNOSIS — T8143XA Infection following a procedure, organ and space surgical site, initial encounter: Secondary | ICD-10-CM | POA: Diagnosis not present

## 2022-09-24 DIAGNOSIS — E785 Hyperlipidemia, unspecified: Secondary | ICD-10-CM | POA: Diagnosis present

## 2022-09-24 DIAGNOSIS — I4892 Unspecified atrial flutter: Secondary | ICD-10-CM | POA: Diagnosis not present

## 2022-09-24 DIAGNOSIS — K5790 Diverticulosis of intestine, part unspecified, without perforation or abscess without bleeding: Secondary | ICD-10-CM | POA: Diagnosis present

## 2022-09-24 DIAGNOSIS — E871 Hypo-osmolality and hyponatremia: Secondary | ICD-10-CM | POA: Diagnosis present

## 2022-09-24 DIAGNOSIS — A419 Sepsis, unspecified organism: Secondary | ICD-10-CM | POA: Diagnosis not present

## 2022-09-24 DIAGNOSIS — J9602 Acute respiratory failure with hypercapnia: Secondary | ICD-10-CM | POA: Diagnosis not present

## 2022-09-24 DIAGNOSIS — I2510 Atherosclerotic heart disease of native coronary artery without angina pectoris: Secondary | ICD-10-CM | POA: Diagnosis present

## 2022-09-24 DIAGNOSIS — I10 Essential (primary) hypertension: Secondary | ICD-10-CM | POA: Diagnosis not present

## 2022-09-24 DIAGNOSIS — C189 Malignant neoplasm of colon, unspecified: Secondary | ICD-10-CM

## 2022-09-24 DIAGNOSIS — F411 Generalized anxiety disorder: Secondary | ICD-10-CM | POA: Diagnosis present

## 2022-09-24 DIAGNOSIS — L89896 Pressure-induced deep tissue damage of other site: Secondary | ICD-10-CM | POA: Diagnosis not present

## 2022-09-24 DIAGNOSIS — R6521 Severe sepsis with septic shock: Secondary | ICD-10-CM | POA: Diagnosis not present

## 2022-09-24 DIAGNOSIS — C779 Secondary and unspecified malignant neoplasm of lymph node, unspecified: Secondary | ICD-10-CM | POA: Diagnosis present

## 2022-09-24 DIAGNOSIS — N179 Acute kidney failure, unspecified: Secondary | ICD-10-CM

## 2022-09-24 DIAGNOSIS — D696 Thrombocytopenia, unspecified: Secondary | ICD-10-CM | POA: Diagnosis present

## 2022-09-24 DIAGNOSIS — Z7985 Long-term (current) use of injectable non-insulin antidiabetic drugs: Secondary | ICD-10-CM

## 2022-09-24 DIAGNOSIS — Z79899 Other long term (current) drug therapy: Secondary | ICD-10-CM

## 2022-09-24 DIAGNOSIS — J690 Pneumonitis due to inhalation of food and vomit: Secondary | ICD-10-CM | POA: Diagnosis not present

## 2022-09-24 DIAGNOSIS — R001 Bradycardia, unspecified: Secondary | ICD-10-CM | POA: Diagnosis present

## 2022-09-24 DIAGNOSIS — I9589 Other hypotension: Secondary | ICD-10-CM | POA: Diagnosis not present

## 2022-09-24 DIAGNOSIS — K2950 Unspecified chronic gastritis without bleeding: Secondary | ICD-10-CM | POA: Diagnosis present

## 2022-09-24 DIAGNOSIS — D509 Iron deficiency anemia, unspecified: Secondary | ICD-10-CM | POA: Diagnosis present

## 2022-09-24 DIAGNOSIS — E874 Mixed disorder of acid-base balance: Secondary | ICD-10-CM | POA: Diagnosis not present

## 2022-09-24 DIAGNOSIS — I700 Atherosclerosis of aorta: Secondary | ICD-10-CM | POA: Diagnosis present

## 2022-09-24 DIAGNOSIS — Z6833 Body mass index (BMI) 33.0-33.9, adult: Secondary | ICD-10-CM

## 2022-09-24 DIAGNOSIS — R531 Weakness: Secondary | ICD-10-CM

## 2022-09-24 DIAGNOSIS — Y838 Other surgical procedures as the cause of abnormal reaction of the patient, or of later complication, without mention of misadventure at the time of the procedure: Secondary | ICD-10-CM | POA: Diagnosis not present

## 2022-09-24 DIAGNOSIS — I959 Hypotension, unspecified: Secondary | ICD-10-CM

## 2022-09-24 DIAGNOSIS — Z9049 Acquired absence of other specified parts of digestive tract: Secondary | ICD-10-CM | POA: Diagnosis not present

## 2022-09-24 DIAGNOSIS — Z8249 Family history of ischemic heart disease and other diseases of the circulatory system: Secondary | ICD-10-CM

## 2022-09-24 DIAGNOSIS — I480 Paroxysmal atrial fibrillation: Secondary | ICD-10-CM | POA: Diagnosis present

## 2022-09-24 DIAGNOSIS — Z7984 Long term (current) use of oral hypoglycemic drugs: Secondary | ICD-10-CM

## 2022-09-24 DIAGNOSIS — K651 Peritoneal abscess: Secondary | ICD-10-CM | POA: Diagnosis not present

## 2022-09-24 DIAGNOSIS — J9601 Acute respiratory failure with hypoxia: Secondary | ICD-10-CM | POA: Diagnosis not present

## 2022-09-24 DIAGNOSIS — I110 Hypertensive heart disease with heart failure: Secondary | ICD-10-CM | POA: Diagnosis present

## 2022-09-24 DIAGNOSIS — F32A Depression, unspecified: Secondary | ICD-10-CM | POA: Diagnosis present

## 2022-09-24 DIAGNOSIS — E1110 Type 2 diabetes mellitus with ketoacidosis without coma: Secondary | ICD-10-CM | POA: Diagnosis not present

## 2022-09-24 DIAGNOSIS — E876 Hypokalemia: Secondary | ICD-10-CM | POA: Diagnosis present

## 2022-09-24 DIAGNOSIS — K8010 Calculus of gallbladder with chronic cholecystitis without obstruction: Secondary | ICD-10-CM | POA: Diagnosis present

## 2022-09-24 DIAGNOSIS — N170 Acute kidney failure with tubular necrosis: Secondary | ICD-10-CM | POA: Diagnosis not present

## 2022-09-24 DIAGNOSIS — I9581 Postprocedural hypotension: Secondary | ICD-10-CM | POA: Diagnosis not present

## 2022-09-24 DIAGNOSIS — G9340 Encephalopathy, unspecified: Secondary | ICD-10-CM | POA: Diagnosis present

## 2022-09-24 DIAGNOSIS — Z7901 Long term (current) use of anticoagulants: Secondary | ICD-10-CM

## 2022-09-24 DIAGNOSIS — I4819 Other persistent atrial fibrillation: Secondary | ICD-10-CM | POA: Diagnosis present

## 2022-09-24 DIAGNOSIS — N402 Nodular prostate without lower urinary tract symptoms: Secondary | ICD-10-CM | POA: Diagnosis present

## 2022-09-24 DIAGNOSIS — E119 Type 2 diabetes mellitus without complications: Secondary | ICD-10-CM

## 2022-09-24 DIAGNOSIS — C182 Malignant neoplasm of ascending colon: Principal | ICD-10-CM | POA: Diagnosis present

## 2022-09-24 DIAGNOSIS — I5032 Chronic diastolic (congestive) heart failure: Secondary | ICD-10-CM | POA: Diagnosis present

## 2022-09-24 DIAGNOSIS — K8020 Calculus of gallbladder without cholecystitis without obstruction: Secondary | ICD-10-CM | POA: Diagnosis not present

## 2022-09-24 DIAGNOSIS — J9811 Atelectasis: Secondary | ICD-10-CM | POA: Diagnosis not present

## 2022-09-24 DIAGNOSIS — R188 Other ascites: Secondary | ICD-10-CM | POA: Diagnosis present

## 2022-09-24 DIAGNOSIS — E861 Hypovolemia: Secondary | ICD-10-CM | POA: Diagnosis present

## 2022-09-24 DIAGNOSIS — D62 Acute posthemorrhagic anemia: Secondary | ICD-10-CM | POA: Diagnosis not present

## 2022-09-24 DIAGNOSIS — Z5331 Laparoscopic surgical procedure converted to open procedure: Secondary | ICD-10-CM

## 2022-09-24 DIAGNOSIS — I2489 Other forms of acute ischemic heart disease: Secondary | ICD-10-CM | POA: Diagnosis not present

## 2022-09-24 DIAGNOSIS — Z833 Family history of diabetes mellitus: Secondary | ICD-10-CM

## 2022-09-24 HISTORY — DX: Other ill-defined heart diseases: I51.89

## 2022-09-24 HISTORY — PX: CHOLECYSTECTOMY: SHX55

## 2022-09-24 HISTORY — DX: Polyp of colon: K63.5

## 2022-09-24 HISTORY — DX: Diverticulosis of intestine, part unspecified, without perforation or abscess without bleeding: K57.90

## 2022-09-24 HISTORY — DX: Paroxysmal atrial fibrillation: I48.0

## 2022-09-24 HISTORY — DX: Calculus of kidney: N20.0

## 2022-09-24 HISTORY — DX: Long term (current) use of anticoagulants: Z79.01

## 2022-09-24 HISTORY — DX: Calculus of gallbladder without cholecystitis without obstruction: K80.20

## 2022-09-24 HISTORY — DX: Atherosclerotic heart disease of native coronary artery without angina pectoris: I25.10

## 2022-09-24 HISTORY — DX: Unspecified chronic gastritis without bleeding: K29.50

## 2022-09-24 HISTORY — DX: Type 2 diabetes mellitus without complications: E11.9

## 2022-09-24 HISTORY — PX: LAPAROSCOPIC RIGHT COLECTOMY: SHX5925

## 2022-09-24 LAB — HEMOGLOBIN A1C
Hgb A1c MFr Bld: 9.8 % — ABNORMAL HIGH (ref 4.8–5.6)
Mean Plasma Glucose: 234.56 mg/dL

## 2022-09-24 LAB — ABO/RH: ABO/RH(D): O NEG

## 2022-09-24 LAB — GLUCOSE, CAPILLARY
Glucose-Capillary: 75 mg/dL (ref 70–99)
Glucose-Capillary: 171 mg/dL — ABNORMAL HIGH (ref 70–99)
Glucose-Capillary: 224 mg/dL — ABNORMAL HIGH (ref 70–99)

## 2022-09-24 SURGERY — COLECTOMY, RIGHT, LAPAROSCOPIC
Anesthesia: General

## 2022-09-24 MED ORDER — MIDAZOLAM HCL 2 MG/2ML IJ SOLN
INTRAMUSCULAR | Status: DC | PRN
  Administered 2022-09-24: 2 mg via INTRAVENOUS

## 2022-09-24 MED ORDER — VITAMIN B-12 1000 MCG PO TABS
500.0000 ug | ORAL_TABLET | Freq: Every day | ORAL | Status: DC
  Filled 2022-09-24: qty 1

## 2022-09-24 MED ORDER — INSULIN ASPART 100 UNIT/ML IJ SOLN: 0.0000 [IU] | Freq: Every day | INTRAMUSCULAR | Status: DC

## 2022-09-24 MED ORDER — PANTOPRAZOLE SODIUM 40 MG IV SOLR
40.0000 mg | Freq: Every day | INTRAVENOUS | Status: DC
  Administered 2022-09-24 – 2022-10-07 (×14): 40 mg via INTRAVENOUS
  Filled 2022-09-24 (×14): qty 10

## 2022-09-24 MED ORDER — ACETAMINOPHEN 500 MG PO TABS: 1000.0000 mg | ORAL_TABLET | ORAL | Status: AC

## 2022-09-24 MED ORDER — FENTANYL CITRATE (PF) 100 MCG/2ML IJ SOLN: 25.0000 ug | INTRAMUSCULAR | Status: DC | PRN

## 2022-09-24 MED ORDER — KETOROLAC TROMETHAMINE 30 MG/ML IJ SOLN
30.0000 mg | Freq: Four times a day (QID) | INTRAMUSCULAR | Status: DC | PRN
  Administered 2022-09-25: 30 mg via INTRAVENOUS

## 2022-09-24 MED ORDER — ACETAMINOPHEN 500 MG PO TABS
ORAL_TABLET | ORAL | Status: AC
  Administered 2022-09-24: 1000 mg via ORAL
  Filled 2022-09-24: qty 2

## 2022-09-24 MED ORDER — PROCHLORPERAZINE MALEATE 10 MG PO TABS: 10.0000 mg | ORAL_TABLET | Freq: Four times a day (QID) | ORAL | Status: DC | PRN

## 2022-09-24 MED ORDER — BUPIVACAINE-EPINEPHRINE (PF) 0.25% -1:200000 IJ SOLN
INTRAMUSCULAR | Status: AC
  Filled 2022-09-24: qty 30

## 2022-09-24 MED ORDER — INSULIN ASPART 100 UNIT/ML IJ SOLN
0.0000 [IU] | Freq: Three times a day (TID) | INTRAMUSCULAR | Status: DC
  Administered 2022-09-25 – 2022-09-28 (×3): 3 [IU] via SUBCUTANEOUS

## 2022-09-24 MED ORDER — CELECOXIB 200 MG PO CAPS: 200.0000 mg | ORAL_CAPSULE | ORAL | Status: AC

## 2022-09-24 MED ORDER — KETOROLAC TROMETHAMINE 30 MG/ML IJ SOLN
30.0000 mg | Freq: Four times a day (QID) | INTRAMUSCULAR | Status: DC
  Administered 2022-09-24 – 2022-09-26 (×4): 30 mg via INTRAVENOUS
  Filled 2022-09-24 (×2): qty 1

## 2022-09-24 MED ORDER — CHLORHEXIDINE GLUCONATE CLOTH 2 % EX PADS: 6.0000 | MEDICATED_PAD | Freq: Once | CUTANEOUS | Status: DC

## 2022-09-24 MED ORDER — FENTANYL CITRATE (PF) 100 MCG/2ML IJ SOLN
INTRAMUSCULAR | Status: DC | PRN
  Administered 2022-09-24 (×4): 50 ug via INTRAVENOUS
  Administered 2022-09-24: 100 ug via INTRAVENOUS

## 2022-09-24 MED ORDER — FLECAINIDE ACETATE 100 MG PO TABS
100.0000 mg | ORAL_TABLET | Freq: Two times a day (BID) | ORAL | Status: DC
  Administered 2022-09-24 – 2022-09-25 (×2): 100 mg via ORAL
  Filled 2022-09-24 (×4): qty 1

## 2022-09-24 MED ORDER — SODIUM CHLORIDE 0.9 % IV SOLN: INTRAVENOUS | Status: DC

## 2022-09-24 MED ORDER — SCOPOLAMINE 1 MG/3DAYS TD PT72
MEDICATED_PATCH | TRANSDERMAL | Status: AC
  Administered 2022-09-24: 1.5 mg via TRANSDERMAL
  Filled 2022-09-24: qty 1

## 2022-09-24 MED ORDER — GLYCOPYRROLATE 0.2 MG/ML IJ SOLN
INTRAMUSCULAR | Status: DC | PRN
  Administered 2022-09-24: .2 mg via INTRAVENOUS

## 2022-09-24 MED ORDER — FAMOTIDINE 20 MG PO TABS
ORAL_TABLET | ORAL | Status: AC
  Administered 2022-09-24: 20 mg via ORAL
  Filled 2022-09-24: qty 1

## 2022-09-24 MED ORDER — EPHEDRINE SULFATE (PRESSORS) 50 MG/ML IJ SOLN
INTRAMUSCULAR | Status: DC | PRN
  Administered 2022-09-24: 7.5 mg via INTRAVENOUS
  Administered 2022-09-24: 10 mg via INTRAVENOUS
  Administered 2022-09-24: 5 mg via INTRAVENOUS
  Administered 2022-09-24: 10 mg via INTRAVENOUS
  Administered 2022-09-24: 5 mg via INTRAVENOUS
  Administered 2022-09-24: 10 mg via INTRAVENOUS

## 2022-09-24 MED ORDER — KETOROLAC TROMETHAMINE 30 MG/ML IJ SOLN
INTRAMUSCULAR | Status: AC
  Filled 2022-09-24: qty 1

## 2022-09-24 MED ORDER — ALVIMOPAN 12 MG PO CAPS: 12.0000 mg | ORAL_CAPSULE | ORAL | Status: AC

## 2022-09-24 MED ORDER — MORPHINE SULFATE (PF) 4 MG/ML IV SOLN
2.0000 mg | INTRAVENOUS | Status: DC | PRN
  Administered 2022-09-27 – 2022-10-03 (×8): 2 mg via INTRAVENOUS
  Filled 2022-09-24 (×8): qty 1

## 2022-09-24 MED ORDER — FENTANYL CITRATE (PF) 100 MCG/2ML IJ SOLN
INTRAMUSCULAR | Status: AC
  Filled 2022-09-24: qty 2

## 2022-09-24 MED ORDER — CEFAZOLIN IN SODIUM CHLORIDE 3-0.9 GM/100ML-% IV SOLN
3.0000 g | INTRAVENOUS | Status: DC
  Filled 2022-09-24: qty 100

## 2022-09-24 MED ORDER — DIPHENHYDRAMINE HCL 12.5 MG/5ML PO ELIX: 12.5000 mg | ORAL_SOLUTION | Freq: Four times a day (QID) | ORAL | Status: DC | PRN

## 2022-09-24 MED ORDER — ADULT MULTIVITAMIN W/MINERALS CH
1.0000 | ORAL_TABLET | Freq: Every day | ORAL | Status: DC
  Filled 2022-09-24: qty 1

## 2022-09-24 MED ORDER — SODIUM CHLORIDE (PF) 0.9 % IJ SOLN
INTRAMUSCULAR | Status: DC | PRN
  Administered 2022-09-24: 100 mL

## 2022-09-24 MED ORDER — PROPOFOL 10 MG/ML IV BOLUS
INTRAVENOUS | Status: DC | PRN
  Administered 2022-09-24: 180 mg via INTRAVENOUS

## 2022-09-24 MED ORDER — INSULIN ASPART 100 UNIT/ML IJ SOLN
INTRAMUSCULAR | Status: AC
  Administered 2022-09-24: 2 [IU] via SUBCUTANEOUS
  Filled 2022-09-24: qty 1

## 2022-09-24 MED ORDER — KETAMINE HCL 50 MG/5ML IJ SOSY
PREFILLED_SYRINGE | INTRAMUSCULAR | Status: AC
  Filled 2022-09-24: qty 5

## 2022-09-24 MED ORDER — MIDAZOLAM HCL 2 MG/2ML IJ SOLN
INTRAMUSCULAR | Status: AC
  Filled 2022-09-24: qty 2

## 2022-09-24 MED ORDER — ACETAMINOPHEN 500 MG PO TABS
ORAL_TABLET | ORAL | Status: AC
  Filled 2022-09-24: qty 2

## 2022-09-24 MED ORDER — SODIUM CHLORIDE (PF) 0.9 % IJ SOLN
INTRAMUSCULAR | Status: AC
  Filled 2022-09-24: qty 10

## 2022-09-24 MED ORDER — BUPIVACAINE LIPOSOME 1.3 % IJ SUSP
INTRAMUSCULAR | Status: AC
  Filled 2022-09-24: qty 20

## 2022-09-24 MED ORDER — ONDANSETRON HCL 4 MG/2ML IJ SOLN
INTRAMUSCULAR | Status: DC | PRN
  Administered 2022-09-24: 4 mg via INTRAVENOUS

## 2022-09-24 MED ORDER — METRONIDAZOLE 500 MG/100ML IV SOLN
500.0000 mg | Freq: Once | INTRAVENOUS | Status: AC
  Administered 2022-09-24: 500 mg via INTRAVENOUS
  Filled 2022-09-24: qty 100

## 2022-09-24 MED ORDER — SODIUM CHLORIDE (PF) 0.9 % IJ SOLN
INTRAMUSCULAR | Status: AC
  Filled 2022-09-24: qty 50

## 2022-09-24 MED ORDER — TURMERIC 400 MG PO CAPS: 400.0000 mg | ORAL_CAPSULE | Freq: Every day | ORAL | Status: DC

## 2022-09-24 MED ORDER — SCOPOLAMINE 1 MG/3DAYS TD PT72: 1.0000 | MEDICATED_PATCH | TRANSDERMAL | Status: DC

## 2022-09-24 MED ORDER — SODIUM CHLORIDE 0.9 % IV SOLN: 2.0000 g | Freq: Two times a day (BID) | INTRAVENOUS | Status: AC

## 2022-09-24 MED ORDER — KETAMINE HCL 10 MG/ML IJ SOLN
INTRAMUSCULAR | Status: DC | PRN
  Administered 2022-09-24: 30 mg via INTRAVENOUS

## 2022-09-24 MED ORDER — GABAPENTIN 300 MG PO CAPS
ORAL_CAPSULE | ORAL | Status: AC
  Administered 2022-09-24: 300 mg via ORAL
  Filled 2022-09-24: qty 1

## 2022-09-24 MED ORDER — LACTATED RINGERS IV SOLN: INTRAVENOUS | Status: DC | PRN

## 2022-09-24 MED ORDER — OXYCODONE HCL 5 MG PO TABS: 5.0000 mg | ORAL_TABLET | Freq: Once | ORAL | Status: DC | PRN

## 2022-09-24 MED ORDER — ONDANSETRON HCL 4 MG/2ML IJ SOLN: 4.0000 mg | Freq: Once | INTRAMUSCULAR | Status: DC | PRN

## 2022-09-24 MED ORDER — FAMOTIDINE 20 MG PO TABS: 20.0000 mg | ORAL_TABLET | Freq: Once | ORAL | Status: AC

## 2022-09-24 MED ORDER — PROPOFOL 10 MG/ML IV BOLUS
INTRAVENOUS | Status: AC
  Filled 2022-09-24: qty 20

## 2022-09-24 MED ORDER — CHLORHEXIDINE GLUCONATE 0.12 % MT SOLN: 15.0000 mL | Freq: Once | OROMUCOSAL | Status: AC

## 2022-09-24 MED ORDER — OXYCODONE HCL 5 MG/5ML PO SOLN: 5.0000 mg | Freq: Once | ORAL | Status: DC | PRN

## 2022-09-24 MED ORDER — DIPHENHYDRAMINE HCL 50 MG/ML IJ SOLN: 12.5000 mg | Freq: Four times a day (QID) | INTRAMUSCULAR | Status: DC | PRN

## 2022-09-24 MED ORDER — METOPROLOL TARTRATE 25 MG PO TABS
100.0000 mg | ORAL_TABLET | Freq: Two times a day (BID) | ORAL | Status: DC
  Administered 2022-09-24: 100 mg via ORAL
  Filled 2022-09-24 (×2): qty 4

## 2022-09-24 MED ORDER — PROCHLORPERAZINE EDISYLATE 10 MG/2ML IJ SOLN: 5.0000 mg | Freq: Four times a day (QID) | INTRAMUSCULAR | Status: DC | PRN

## 2022-09-24 MED ORDER — ONDANSETRON HCL 4 MG/2ML IJ SOLN: 4.0000 mg | Freq: Four times a day (QID) | INTRAMUSCULAR | Status: DC | PRN

## 2022-09-24 MED ORDER — ALBUMIN HUMAN 5 % IV SOLN: INTRAVENOUS | Status: DC | PRN

## 2022-09-24 MED ORDER — DEXTROSE 5 % IV SOLN
INTRAVENOUS | Status: DC | PRN
  Administered 2022-09-24 (×2): 3 g via INTRAVENOUS

## 2022-09-24 MED ORDER — SUGAMMADEX SODIUM 500 MG/5ML IV SOLN
INTRAVENOUS | Status: DC | PRN
  Administered 2022-09-24: 500 mg via INTRAVENOUS

## 2022-09-24 MED ORDER — ORAL CARE MOUTH RINSE: 15.0000 mL | Freq: Once | OROMUCOSAL | Status: AC

## 2022-09-24 MED ORDER — SODIUM CHLORIDE 0.9 % IV SOLN
INTRAVENOUS | Status: AC
  Administered 2022-09-24: 2 g via INTRAVENOUS
  Filled 2022-09-24: qty 2

## 2022-09-24 MED ORDER — VITAMIN E 180 MG (400 UNIT) PO CAPS
400.0000 [IU] | ORAL_CAPSULE | Freq: Every day | ORAL | Status: DC
  Administered 2022-09-25: 400 [IU] via ORAL
  Filled 2022-09-24 (×2): qty 1

## 2022-09-24 MED ORDER — OXYCODONE HCL 5 MG PO TABS
5.0000 mg | ORAL_TABLET | ORAL | Status: DC | PRN
  Administered 2022-10-02: 10 mg via ORAL
  Administered 2022-10-04: 5 mg via ORAL
  Filled 2022-09-24: qty 1
  Filled 2022-09-24: qty 2

## 2022-09-24 MED ORDER — ONDANSETRON 4 MG PO TBDP: 4.0000 mg | ORAL_TABLET | Freq: Four times a day (QID) | ORAL | Status: DC | PRN

## 2022-09-24 MED ORDER — ENOXAPARIN SODIUM 60 MG/0.6ML IJ SOSY
0.5000 mg/kg | PREFILLED_SYRINGE | INTRAMUSCULAR | Status: DC
  Filled 2022-09-24: qty 1.2

## 2022-09-24 MED ORDER — ACETAMINOPHEN 500 MG PO TABS
1000.0000 mg | ORAL_TABLET | Freq: Four times a day (QID) | ORAL | Status: DC
  Administered 2022-09-24 – 2022-09-26 (×7): 1000 mg via ORAL
  Filled 2022-09-24 (×5): qty 2

## 2022-09-24 MED ORDER — ROCURONIUM BROMIDE 100 MG/10ML IV SOLN
INTRAVENOUS | Status: DC | PRN
  Administered 2022-09-24 (×2): 20 mg via INTRAVENOUS
  Administered 2022-09-24: 100 mg via INTRAVENOUS

## 2022-09-24 MED ORDER — INSULIN ASPART 100 UNIT/ML IJ SOLN
4.0000 [IU] | Freq: Three times a day (TID) | INTRAMUSCULAR | Status: DC
  Administered 2022-09-25 – 2022-09-26 (×2): 4 [IU] via SUBCUTANEOUS
  Filled 2022-09-24 (×3): qty 1

## 2022-09-24 MED ORDER — CHLORHEXIDINE GLUCONATE 0.12 % MT SOLN
OROMUCOSAL | Status: AC
  Administered 2022-09-24: 15 mL via OROMUCOSAL
  Filled 2022-09-24: qty 15

## 2022-09-24 MED ORDER — GABAPENTIN 300 MG PO CAPS: 300.0000 mg | ORAL_CAPSULE | ORAL | Status: AC

## 2022-09-24 MED ORDER — CELECOXIB 200 MG PO CAPS
ORAL_CAPSULE | ORAL | Status: AC
  Administered 2022-09-24: 200 mg via ORAL
  Filled 2022-09-24: qty 1

## 2022-09-24 MED ORDER — ALVIMOPAN 12 MG PO CAPS
ORAL_CAPSULE | ORAL | Status: AC
  Administered 2022-09-24: 12 mg via ORAL
  Filled 2022-09-24: qty 1

## 2022-09-24 MED ORDER — DEXAMETHASONE SODIUM PHOSPHATE 10 MG/ML IJ SOLN
INTRAMUSCULAR | Status: DC | PRN
  Administered 2022-09-24: 10 mg via INTRAVENOUS

## 2022-09-24 SURGICAL SUPPLY — 82 items
ADH SKN CLS APL DERMABOND .7 (GAUZE/BANDAGES/DRESSINGS) ×1
APL SWBSTK 6 STRL LF DISP (MISCELLANEOUS)
APPLICATOR COTTON TIP 6 STRL (MISCELLANEOUS) ×1 IMPLANT
APPLICATOR COTTON TIP 6IN STRL (MISCELLANEOUS)
APPLIER CLIP 5 13 M/L LIGAMAX5 (MISCELLANEOUS) ×2
APR CLP MED LRG 5 ANG JAW (MISCELLANEOUS) ×2
BLADE SURG SZ10 CARB STEEL (BLADE) ×1 IMPLANT
CATH REDDICK CHOLANGI 4FR 50CM (CATHETERS) IMPLANT
CLIP APPLIE 5 13 M/L LIGAMAX5 (MISCELLANEOUS) ×2 IMPLANT
CNTNR SPEC 2.5X3XGRAD LEK (MISCELLANEOUS) ×1
CONT SPEC 4OZ STER OR WHT (MISCELLANEOUS) ×1
CONT SPEC 4OZ STRL OR WHT (MISCELLANEOUS) ×1
CONTAINER SPEC 2.5X3XGRAD LEK (MISCELLANEOUS) IMPLANT
DERMABOND ADVANCED .7 DNX12 (GAUZE/BANDAGES/DRESSINGS) ×2 IMPLANT
DRAPE C-ARM XRAY 36X54 (DRAPES) ×2 IMPLANT
DRAPE INCISE IOBAN 66X45 STRL (DRAPES) ×1 IMPLANT
DRSG OPSITE POSTOP 3X4 (GAUZE/BANDAGES/DRESSINGS) IMPLANT
DRSG OPSITE POSTOP 4X10 (GAUZE/BANDAGES/DRESSINGS) IMPLANT
ELECT BLADE 6.5 EXT (BLADE) IMPLANT
ELECT CAUTERY BLADE 6.4 (BLADE) ×4 IMPLANT
ELECT CAUTERY BLADE TIP 2.5 (TIP) ×1
ELECT REM PT RETURN 9FT ADLT (ELECTROSURGICAL) ×1
ELECTRODE CAUTERY BLDE TIP 2.5 (TIP) ×1 IMPLANT
ELECTRODE REM PT RTRN 9FT ADLT (ELECTROSURGICAL) ×2 IMPLANT
GLOVE BIO SURGEON STRL SZ7 (GLOVE) ×6 IMPLANT
GOWN STRL REUS W/ TWL LRG LVL3 (GOWN DISPOSABLE) ×4 IMPLANT
GOWN STRL REUS W/TWL LRG LVL3 (GOWN DISPOSABLE) ×6
HANDLE SUCTION POOLE (INSTRUMENTS) ×2 IMPLANT
HANDLE YANKAUER SUCT BULB TIP (MISCELLANEOUS) ×1 IMPLANT
HOLDER FOLEY CATH W/STRAP (MISCELLANEOUS) ×1 IMPLANT
IRRIGATION STRYKERFLOW (MISCELLANEOUS) ×1 IMPLANT
IRRIGATOR STRYKERFLOW (MISCELLANEOUS) ×1
IV CATH ANGIO 12GX3 LT BLUE (NEEDLE) IMPLANT
IV NS 1000ML (IV SOLUTION) ×1
IV NS 1000ML BAXH (IV SOLUTION) ×1 IMPLANT
KIT MARKER MARGIN INK (KITS) IMPLANT
L-HOOK LAP DISP 36CM (ELECTROSURGICAL)
LHOOK LAP DISP 36CM (ELECTROSURGICAL) ×1 IMPLANT
MANIFOLD NEPTUNE II (INSTRUMENTS) ×1 IMPLANT
NEEDLE HYPO 22GX1.5 SAFETY (NEEDLE) ×1 IMPLANT
NS IRRIG 1000ML POUR BTL (IV SOLUTION) ×1 IMPLANT
NS IRRIG 500ML POUR BTL (IV SOLUTION) ×2 IMPLANT
PACK COLON CLEAN CLOSURE (MISCELLANEOUS) ×1 IMPLANT
PACK LAP CHOLECYSTECTOMY (MISCELLANEOUS) ×2 IMPLANT
PENCIL SMOKE EVACUATOR (MISCELLANEOUS) ×1 IMPLANT
RELOAD PROXIMATE 75MM BLUE (ENDOMECHANICALS) ×2 IMPLANT
RELOAD STAPLE 75 3.8 BLU REG (ENDOMECHANICALS) IMPLANT
RETRACTOR WOUND ALXS 18CM MED (MISCELLANEOUS) IMPLANT
RTRCTR WOUND ALEXIS O 18CM MED (MISCELLANEOUS)
SCISSORS METZENBAUM CVD 33 (INSTRUMENTS) ×1 IMPLANT
SET TUBE SMOKE EVAC HIGH FLOW (TUBING) ×1 IMPLANT
SHEARS HARMONIC ACE PLUS 36CM (ENDOMECHANICALS) ×1 IMPLANT
SLEEVE Z-THREAD 5X100MM (TROCAR) ×2 IMPLANT
SPIKE FLUID TRANSFER (MISCELLANEOUS) ×2 IMPLANT
SPONGE T-LAP 18X18 ~~LOC~~+RFID (SPONGE) ×3 IMPLANT
SPONGE T-LAP 18X36 ~~LOC~~+RFID STR (SPONGE) ×1 IMPLANT
STAPLER GUN LINEAR PROX 60 (STAPLE) IMPLANT
STAPLER PROXIMATE 75MM BLUE (STAPLE) IMPLANT
STOPCOCK 4 WAY LG BORE MALE ST (IV SETS) IMPLANT
SUCTION POOLE HANDLE (INSTRUMENTS)
SUT ETHIBOND 0 MO6 C/R (SUTURE) IMPLANT
SUT MNCRL 4-0 (SUTURE)
SUT MNCRL 4-0 27XMFL (SUTURE)
SUT MNCRL AB 4-0 PS2 18 (SUTURE) ×1 IMPLANT
SUT PDS AB 0 CT1 27 (SUTURE) ×2 IMPLANT
SUT SILK 2 0 (SUTURE) ×1
SUT SILK 2 0 SH CR/8 (SUTURE) ×1 IMPLANT
SUT SILK 2-0 30XBRD TIE 12 (SUTURE) ×1 IMPLANT
SUT VICRYL 0 AB UR-6 (SUTURE) ×2 IMPLANT
SUTURE MNCRL 4-0 27XMF (SUTURE) ×2 IMPLANT
SYR 20ML LL LF (SYRINGE) ×1 IMPLANT
SYS BAG RETRIEVAL 10MM (BASKET)
SYS LAPSCP GELPORT 120MM (MISCELLANEOUS) ×1
SYSTEM BAG RETRIEVAL 10MM (BASKET) ×1 IMPLANT
SYSTEM LAPSCP GELPORT 120MM (MISCELLANEOUS) ×1 IMPLANT
TOWEL OR 17X26 4PK STRL BLUE (TOWEL DISPOSABLE) ×2 IMPLANT
TRAP FLUID SMOKE EVACUATOR (MISCELLANEOUS) ×1 IMPLANT
TRAY FOLEY MTR SLVR 16FR STAT (SET/KITS/TRAYS/PACK) ×2 IMPLANT
TROCAR XCEL BLUNT TIP 100MML (ENDOMECHANICALS) ×1 IMPLANT
TROCAR XCEL NON-BLD 5MMX100MML (ENDOMECHANICALS) ×1 IMPLANT
TUBING EVAC SMOKE HEATED PNEUM (TUBING) ×1 IMPLANT
WATER STERILE IRR 500ML POUR (IV SOLUTION) ×1 IMPLANT

## 2022-09-24 NOTE — Transfer of Care (Signed)
Immediate Anesthesia Transfer of Care Note  Patient: Nicolas Zuniga  Procedure(s) Performed: LAPAROSCOPIC RIGHT COLECTOMY, RNFA to assist LAPAROSCOPIC CHOLECYSTECTOMY  Patient Location: PACU  Anesthesia Type:General  Level of Consciousness: drowsy  Airway & Oxygen Therapy: Patient Spontanous Breathing and Patient connected to face mask oxygen  Post-op Assessment: Report given to RN and Post -op Vital signs reviewed and stable  Post vital signs: Reviewed and stable  Last Vitals:  Vitals Value Taken Time  BP 137/52 09/24/22 1547  Temp    Pulse 59 09/24/22 1552  Resp 19 09/24/22 1552  SpO2 100 % 09/24/22 1552  Vitals shown include unvalidated device data.  Last Pain:  Vitals:   09/24/22 1038  TempSrc: Temporal  PainSc: 1          Complications: No notable events documented.

## 2022-09-24 NOTE — Anesthesia Postprocedure Evaluation (Signed)
Anesthesia Post Note  Patient: Nicolas Zuniga  Procedure(s) Performed: LAPAROSCOPIC RIGHT COLECTOMY, RNFA to assist LAPAROSCOPIC CHOLECYSTECTOMY  Patient location during evaluation: PACU Anesthesia Type: General Level of consciousness: awake and alert Pain management: pain level controlled Vital Signs Assessment: post-procedure vital signs reviewed and stable Respiratory status: spontaneous breathing, nonlabored ventilation, respiratory function stable and patient connected to nasal cannula oxygen Cardiovascular status: blood pressure returned to baseline and stable Postop Assessment: no apparent nausea or vomiting Anesthetic complications: no   No notable events documented.   Last Vitals:  Vitals:   09/24/22 1715 09/24/22 1725  BP: 126/62 (!) 129/57  Pulse: 60 61  Resp: 18   Temp: (!) 36 C (!) 36.1 C  SpO2: 97%     Last Pain:  Vitals:   09/24/22 1850  TempSrc:   PainSc: 0-No pain                 Molli Barrows

## 2022-09-24 NOTE — Anesthesia Preprocedure Evaluation (Addendum)
Anesthesia Evaluation  Patient identified by MRN, date of birth, ID band Patient awake    Reviewed: Allergy & Precautions, NPO status , Patient's Chart, lab work & pertinent test results  History of Anesthesia Complications Negative for: history of anesthetic complications  Airway Mallampati: III  TM Distance: <3 FB Neck ROM: Limited    Dental no notable dental hx. (+) Teeth Intact   Pulmonary neg pulmonary ROS, neg shortness of breath,    Pulmonary exam normal breath sounds clear to auscultation       Cardiovascular Exercise Tolerance: Good hypertension, Pt. on medications + dysrhythmias Atrial Fibrillation  Rhythm:Regular Rate:Normal - Systolic murmurs    Neuro/Psych PSYCHIATRIC DISORDERS Anxiety negative neurological ROS     GI/Hepatic negative GI ROS, Neg liver ROS, neg GERD  ,  Endo/Other  diabetes, Type 2  Renal/GU Renal disease  negative genitourinary   Musculoskeletal   Abdominal   Peds  Hematology negative hematology ROS (+)   Anesthesia Other Findings Past Medical History: No date: A-fib ([place]) No date: Diabetes mellitus without complication (H[facility]) No date: Hypertension  Past Surgical History: 12/22/2018: CARDIOVERSION; N/A     Comment:  Procedure: CARDIOVERSION (CATH LAB);  Surgeon: Corey Skains, MD;  Location: [facility] ORS;  Service:               Cardiovascular;  Laterality: N/A; 08/18/2021: CARDIOVERSION; N/A     Comment:  Procedure: CARDIOVERSION;  Surgeon: Corey Skains,               MD;  Location: [facility] ORS;  Service: Cardiovascular;                Laterality: N/A; 03/11/2022: CARDIOVERSION; N/A     Comment:  Procedure: CARDIOVERSION;  Surgeon: Corey Skains,               MD;  Location: [facility] ORS;  Service: Cardiovascular;                Laterality: N/A; 03/31/2022: CARDIOVERSION; N/A     Comment:  Procedure: CARDIOVERSION;  Surgeon: Corey Skains,               MD;   Location: [facility] ORS;  Service: Cardiovascular;                Laterality: N/A; 07/09/2016: ELECTROPHYSIOLOGIC STUDY; N/A     Comment:  Procedure: CARDIOVERSION;  Surgeon: Dionisio David, MD;               Location: [facility] ORS;  Service: Cardiovascular;                Laterality: N/A; 07/09/2016: TEE WITHOUT CARDIOVERSION; N/A     Comment:  Procedure: TRANSESOPHAGEAL ECHOCARDIOGRAM (TEE);                Surgeon: Dionisio David, MD;  Location: [facility] ORS;                Service: Cardiovascular;  Laterality: N/A;     Reproductive/Obstetrics negative OB ROS                             Anesthesia Physical  Anesthesia Plan  ASA: 3  Anesthesia Plan: General   Post-op Pain Management: Tylenol PO (pre-op)*, Gabapentin PO (pre-op)* and Celebrex PO (pre-op)*   Induction: Intravenous  PONV Risk Score and Plan: 4  or greater and Propofol infusion, TIVA, Midazolam, Scopolamine patch - Pre-op, Dexamethasone and Ondansetron  Airway Management Planned: Oral ETT  Additional Equipment: None  Intra-op Plan:   Post-operative Plan: Extubation in OR  Informed Consent: I have reviewed the patients History and Physical, chart, labs and discussed the procedure including the risks, benefits and alternatives for the proposed anesthesia with the patient or authorized representative who has indicated his/her understanding and acceptance.     Dental Advisory Given  Plan Discussed with: Anesthesiologist, CRNA and Surgeon  Anesthesia Plan Comments: (Discussed risks of anesthesia with patient, including PONV, sore throat, lip/dental/eye damage. Rare risks discussed as well, such as cardiorespiratory and neurological sequelae, and allergic reactions. Discussed the role of CRNA in patient's perioperative care. Patient understands.)        Anesthesia Quick Evaluation

## 2022-09-24 NOTE — Op Note (Addendum)
PROCEDURES: 1. Attempted Hand assisted Laparoscopic Right colectomy converted to Open Right colectomy with ileocolic anastomosis 2. Open cholecystectomy  Pre-operative Diagnosis: Right Colon CA, cholelithiasis  Post-operative Diagnosis: Same  Surgeon: Marjory Lies Christasia Angeletti   Assistants: Charlett Nose RNFA Required due to the complexity of the case: for exposure and creation of the anastomosis  Anesthesia: General endotracheal anesthesia  ASA Class: 2  Surgeon: Caroleen Hamman , MD FACS  Anesthesia: Gen. with endotracheal tube   Findings: Right colon mass, with evidence of bulky mesenteric nodal involvement Large Right colon mass adhered to the retroperitoneum, Mesenteric nodes within terminal ileum.   Tension free anastomosis, no evidence of intraop leak and good perfusion Chronic calculus cholecystitis  Estimated Blood Loss: 80cc         Drains: none         Specimens: Right colon and gallbladder       Complications: none          Procedure Details  The patient was seen again in the Holding Room. The benefits, complications, treatment options, and expected outcomes were discussed with the patient. The risks of bleeding, infection, recurrence of symptoms, failure to resolve symptoms,  bowel injury, any of which could require further surgery were reviewed with the patient.   The patient was taken to Operating Room, identified  and the procedure verified.  A Time Out was held and the above information confirmed.  Prior to the induction of general anesthesia, antibiotic prophylaxis was administered. VTE prophylaxis was in place. General endotracheal anesthesia was then administered and tolerated well. After the induction, the abdomen was prepped with Chloraprep and draped in the sterile fashion. The patient was positioned in the supine position.  7 cm incision was created as a midline mini laparotomy. The abdominal cavity was entered under direct visualization and the GelPort device was  placed. two 5 mm ports were placed  under direct visualization and pneumoperitoneum was obtained.  No hemodynamic changes were observed. The greater omentum was divided and the hepatic flexure was taken down using harmonic scalpel.  The white line of Toldt was incised and a lateral to medial dissection was performed.  We identified the right ureter as well as the duodenum and preserve both structures at all times. THe mass was very large with some involvement to the lateral wall and retroperitoneum. I was able to incised the peritoneum and dissect the right colon. I was able to mobilize the attachments of the cecum and terminal ileum.  There was bulky mesenteric nodes c/w metastatic nodal disease.  We had issues mobilizing the colon due to the mass being large and with posterior attachments vs chronic inflammatory changes. After about 1 1/2 of laparoscopic procedure I felt I was not making more progress due to the need for further exposure that I was not getting laparoscopically. I decided to convert to an open procedure to improve exposure and visualization. Midline laparotomy performed using 15 blade knife, fascial opening enlarged. Balfour retractor placed.  I was able to mobilize the omentum from the transverse colon and gain additional mobility. Duodenum was visualized and preserved. I used some finger dissection to create a place in the retroperitoneal place of the cecum. Mobilization of the cecum and TI completed with harmonic.  I was able to eviscerate the Right colon, the transverse colon was selected for division. We identified the middle colic artery on selected a spot right to the middle colic artery.  Using a window within the mesentery the colon was divided  using 75 mm GIA, attention was turned to the TI, we noticed significant nodal disease involving the mesentery of the terminal Ileum (TI).   A 20 cm margin on the terminal ileum was identified and we created a window with electrocautery and  divided the terminal ileum.  The right colic pedicle was dissected and the vein and artery were individually ligated with a 2-0 silk sutures.  The rest of the mesentery was divided using the harmonic scalpel.  Please note that we went as low as possible to the base of the mesentery to obtain adequate lymph nodes and adequate margins of dissection. Specimen was passed and sent to permanent pathology.  A standard side-to-side functional end to end staple anastomosis was created with multiple loads of a 75 GIA stapler device.  We check for patency as well as leak.  There was a tension-free anastomosis with good perfusion and no evidence of intraoperative leak.    Attention was turned to the Gallbladder. The fundus was retracted and the peritoneum of the GB scored, We identified the cystic artery and duct after obtaining the window of safety. Each was double clipped and dived. The GB was removed from the liver with electrocautery. Hemostasis obtained w cautery. We changed gloves and place a clean closure tray.   Liposomal Marcaine was injected throughout the abdominal wall on both sides under direct visualization and palpation.  The fascia was closed with a running 0 PDS using the small bite techniques.  Skin was closed with interrupted staples  Needle and laparotomy counts were correct and there were no immediate complications   Caroleen Hamman, MD, FACS

## 2022-09-24 NOTE — Interval H&P Note (Signed)
History and Physical Interval Note:  09/24/2022 10:35 AM  Nicolas Zuniga  has presented today for surgery, with the diagnosis of Colan cancer, gallstones, chronic abdominal pain.  The various methods of treatment have been discussed with the patient and family. After consideration of risks, benefits and other options for treatment, the patient has consented to  Procedure(s): LAPAROSCOPIC RIGHT COLECTOMY, RNFA to assist (N/A) LAPAROSCOPIC CHOLECYSTECTOMY (N/A) as a surgical intervention.  The patient's history has been reviewed, patient examined, no change in status, stable for surgery.  I have reviewed the patient's chart and labs.  Questions were answered to the patient's satisfaction.     Ross

## 2022-09-24 NOTE — Anesthesia Procedure Notes (Signed)
Procedure Name: Intubation Date/Time: 09/24/2022 11:19 AM  Performed by: Babs Sciara, CRNAPre-anesthesia Checklist: Patient identified, Timeout performed, Emergency Drugs available, Suction available and Patient being monitored Patient Re-evaluated:Patient Re-evaluated prior to induction Oxygen Delivery Method: Circle system utilized Preoxygenation: Pre-oxygenation with 100% oxygen Induction Type: IV induction Ventilation: Mask ventilation without difficulty Laryngoscope Size: McGraph and 4 Grade View: Grade I Tube type: Oral Tube size: 7.0 mm Number of attempts: 1 Airway Equipment and Method: Stylet and Video-laryngoscopy Placement Confirmation: positive ETCO2 and breath sounds checked- equal and bilateral Secured at: 22 (teeth) cm Tube secured with: Tape Dental Injury: Teeth and Oropharynx as per pre-operative assessment  Comments: Eyes taped ou prior to intubation

## 2022-09-25 ENCOUNTER — Encounter: Payer: Self-pay | Admitting: Surgery

## 2022-09-25 DIAGNOSIS — I10 Essential (primary) hypertension: Secondary | ICD-10-CM

## 2022-09-25 DIAGNOSIS — Z9049 Acquired absence of other specified parts of digestive tract: Secondary | ICD-10-CM | POA: Diagnosis not present

## 2022-09-25 DIAGNOSIS — I9589 Other hypotension: Secondary | ICD-10-CM

## 2022-09-25 DIAGNOSIS — E119 Type 2 diabetes mellitus without complications: Secondary | ICD-10-CM | POA: Diagnosis not present

## 2022-09-25 DIAGNOSIS — N179 Acute kidney failure, unspecified: Secondary | ICD-10-CM | POA: Diagnosis not present

## 2022-09-25 DIAGNOSIS — E871 Hypo-osmolality and hyponatremia: Secondary | ICD-10-CM

## 2022-09-25 DIAGNOSIS — E861 Hypovolemia: Secondary | ICD-10-CM

## 2022-09-25 DIAGNOSIS — I959 Hypotension, unspecified: Secondary | ICD-10-CM

## 2022-09-25 LAB — BASIC METABOLIC PANEL
Anion gap: 14 (ref 5–15)
Anion gap: 11 (ref 5–15)
BUN: 22 mg/dL (ref 8–23)
BUN: 22 mg/dL (ref 8–23)
CO2: 17 mmol/L — ABNORMAL LOW (ref 22–32)
CO2: 17 mmol/L — ABNORMAL LOW (ref 22–32)
Calcium: 8.4 mg/dL — ABNORMAL LOW (ref 8.9–10.3)
Calcium: 8 mg/dL — ABNORMAL LOW (ref 8.9–10.3)
Chloride: 100 mmol/L (ref 98–111)
Chloride: 102 mmol/L (ref 98–111)
Creatinine, Ser: 1.23 mg/dL (ref 0.61–1.24)
Creatinine, Ser: 1.93 mg/dL — ABNORMAL HIGH (ref 0.61–1.24)
GFR, Estimated: >60 mL/min (ref >60)
GFR, Estimated: 38 mL/min — ABNORMAL LOW (ref >60)
Glucose, Bld: 232 mg/dL — ABNORMAL HIGH (ref 70–99)
Glucose, Bld: 161 mg/dL — ABNORMAL HIGH (ref 70–99)
Potassium: 4.3 mmol/L (ref 3.5–5.1)
Potassium: 3.7 mmol/L (ref 3.5–5.1)
Sodium: 131 mmol/L — ABNORMAL LOW (ref 135–145)
Sodium: 130 mmol/L — ABNORMAL LOW (ref 135–145)

## 2022-09-25 LAB — CBC
HCT: 29.8 % — ABNORMAL LOW (ref 39.0–52.0)
HCT: 28 % — ABNORMAL LOW (ref 39.0–52.0)
Hemoglobin: 9.2 g/dL — ABNORMAL LOW (ref 13.0–17.0)
Hemoglobin: 8.6 g/dL — ABNORMAL LOW (ref 13.0–17.0)
MCH: 23.8 pg — ABNORMAL LOW (ref 26.0–34.0)
MCH: 24 pg — ABNORMAL LOW (ref 26.0–34.0)
MCHC: 30.9 g/dL (ref 30.0–36.0)
MCHC: 30.7 g/dL (ref 30.0–36.0)
MCV: 77.2 fL — ABNORMAL LOW (ref 80.0–100.0)
MCV: 78.2 fL — ABNORMAL LOW (ref 80.0–100.0)
Platelets: 225 10*3/uL (ref 150–400)
Platelets: 181 10*3/uL (ref 150–400)
RBC: 3.86 MIL/uL — ABNORMAL LOW (ref 4.22–5.81)
RBC: 3.58 MIL/uL — ABNORMAL LOW (ref 4.22–5.81)
RDW: 14.1 % (ref 11.5–15.5)
RDW: 14.3 % (ref 11.5–15.5)
WBC: 16.9 10*3/uL — ABNORMAL HIGH (ref 4.0–10.5)
WBC: 16 10*3/uL — ABNORMAL HIGH (ref 4.0–10.5)
nRBC: 0 % (ref 0.0–0.2)
nRBC: 0 % (ref 0.0–0.2)

## 2022-09-25 LAB — GLUCOSE, CAPILLARY
Glucose-Capillary: 190 mg/dL — ABNORMAL HIGH (ref 70–99)
Glucose-Capillary: 148 mg/dL — ABNORMAL HIGH (ref 70–99)
Glucose-Capillary: 108 mg/dL — ABNORMAL HIGH (ref 70–99)
Glucose-Capillary: 150 mg/dL — ABNORMAL HIGH (ref 70–99)

## 2022-09-25 LAB — MAGNESIUM: Magnesium: 1.7 mg/dL (ref 1.7–2.4)

## 2022-09-25 MED ORDER — KETOROLAC TROMETHAMINE 30 MG/ML IJ SOLN
INTRAMUSCULAR | Status: AC
  Administered 2022-09-25: 30 mg via INTRAVENOUS
  Filled 2022-09-25: qty 1

## 2022-09-25 MED ORDER — ACETAMINOPHEN 500 MG PO TABS
ORAL_TABLET | ORAL | Status: AC
  Filled 2022-09-25: qty 2

## 2022-09-25 MED ORDER — SODIUM CHLORIDE 0.9 % IV BOLUS: 500.0000 mL | Freq: Once | INTRAVENOUS | Status: DC

## 2022-09-25 MED ORDER — SODIUM CHLORIDE 0.9 % IV BOLUS
1000.0000 mL | Freq: Once | INTRAVENOUS | Status: AC
  Administered 2022-09-25: 1000 mL via INTRAVENOUS

## 2022-09-25 MED ORDER — PREGABALIN 50 MG PO CAPS
ORAL_CAPSULE | ORAL | Status: AC
  Filled 2022-09-25: qty 1

## 2022-09-25 MED ORDER — GLUCAGON HCL RDNA (DIAGNOSTIC) 1 MG IJ SOLR
3.0000 mg/h | INTRAVENOUS | Status: DC
  Administered 2022-09-25: 3 mg/h via INTRAVENOUS
  Filled 2022-09-25: qty 5

## 2022-09-25 MED ORDER — ALBUMIN HUMAN 25 % IV SOLN
25.0000 g | Freq: Once | INTRAVENOUS | Status: AC
  Administered 2022-09-25: 25 g via INTRAVENOUS
  Filled 2022-09-25: qty 100

## 2022-09-25 MED ORDER — PREGABALIN 50 MG PO CAPS
50.0000 mg | ORAL_CAPSULE | Freq: Three times a day (TID) | ORAL | Status: DC
  Administered 2022-09-25: 50 mg via ORAL

## 2022-09-25 MED ORDER — ACETAMINOPHEN 500 MG PO TABS
ORAL_TABLET | ORAL | Status: AC
  Administered 2022-09-25: 1000 mg via ORAL
  Filled 2022-09-25: qty 2

## 2022-09-25 MED ORDER — LACTATED RINGERS IV BOLUS: 500.0000 mL | Freq: Once | INTRAVENOUS | Status: DC

## 2022-09-25 MED ORDER — METOPROLOL TARTRATE 25 MG PO TABS
ORAL_TABLET | ORAL | Status: AC
  Filled 2022-09-25: qty 3

## 2022-09-25 MED ORDER — ALVIMOPAN 12 MG PO CAPS
ORAL_CAPSULE | ORAL | Status: AC
  Administered 2022-09-25: 12 mg via ORAL
  Filled 2022-09-25: qty 1

## 2022-09-25 MED ORDER — INSULIN ASPART 100 UNIT/ML IJ SOLN
INTRAMUSCULAR | Status: AC
  Administered 2022-09-25: 4 [IU] via SUBCUTANEOUS
  Filled 2022-09-25: qty 1

## 2022-09-25 MED ORDER — ENOXAPARIN SODIUM 80 MG/0.8ML IJ SOSY
0.5000 mg/kg | PREFILLED_SYRINGE | INTRAMUSCULAR | Status: DC
  Administered 2022-09-25: 62.5 mg via SUBCUTANEOUS
  Filled 2022-09-25: qty 0.63

## 2022-09-25 MED ORDER — GLUCAGON HCL RDNA (DIAGNOSTIC) 1 MG IJ SOLR
5.0000 mg | Freq: Once | INTRAVENOUS | Status: AC
  Administered 2022-09-25: 5 mg via INTRAVENOUS
  Filled 2022-09-25: qty 5

## 2022-09-25 MED ORDER — INSULIN ASPART 100 UNIT/ML IJ SOLN
INTRAMUSCULAR | Status: AC
  Administered 2022-09-25: 2 [IU] via SUBCUTANEOUS
  Filled 2022-09-25: qty 1

## 2022-09-25 MED ORDER — ADULT MULTIVITAMIN W/MINERALS CH
ORAL_TABLET | ORAL | Status: AC
  Administered 2022-09-25: 1 via ORAL
  Filled 2022-09-25: qty 1

## 2022-09-25 MED ORDER — SODIUM CHLORIDE 0.9 % IV SOLN
INTRAVENOUS | Status: AC
  Administered 2022-09-25: 2 g via INTRAVENOUS
  Filled 2022-09-25: qty 2

## 2022-09-25 MED ORDER — METOPROLOL TARTRATE 50 MG PO TABS: 50.0000 mg | ORAL_TABLET | Freq: Two times a day (BID) | ORAL | Status: DC

## 2022-09-25 MED ORDER — VITAMIN B-12 1000 MCG PO TABS
ORAL_TABLET | ORAL | Status: AC
  Administered 2022-09-25: 500 ug via ORAL
  Filled 2022-09-25: qty 1

## 2022-09-25 NOTE — Progress Notes (Signed)
POD # 1 SOme flatus taking clears Op findings d/w pt Doing very well Some acidosis and mild bump creat Mild drop hb from surgery and dilution Good UO VSS   PE NAD Abd: soft, decrease BS, soft , incisions c/d/I. no peritonitis   A/P  Doing well Advance to fulls DC foley mobilize

## 2022-09-25 NOTE — Assessment & Plan Note (Deleted)
Patient with brief episode of relative hypotension that is since resolved after IV fluids.  On my assessment during this period of hypotension patient was mentating normally and did not have any concerning findings.  Review of labs shows that patient has mild AKI, with creatinine elevated from 1.2-1.9, which adds weight to my impression that this was driven primarily by hypovolemia. CBC and BMP otherwise without significant change from prior.  Patient given dose of glucagon due to concern for possible beta-blocker toxicity, however he lacks many of the stigmata of his condition, his dose of beta-blocker at 100 mg metoprolol twice daily is relatively low, and flecainide does not appear to be associated with beta-blocker toxicity.  - Carefully watch I's and O's to maintain euvolemia once patient is consistently normotensive, given lack of significant underlying cardiac disease okay for more fluid resuscitation if necessary - Would discontinue glucagon as its continued use may make his glycemic control difficult and beta-blocker toxicity is unlikely to be the cause of his hypotension - Agree with holding metoprolol and flecainide for the time being, would reassess in a.m. and consider resuming at that time.  Would not be unreasonable to reduce his dose of metoprolol.

## 2022-09-25 NOTE — Assessment & Plan Note (Addendum)
Creatinine peaked at 4.61 on 09/30/2022 improved down to 1.32 on 10/05/2022.

## 2022-09-25 NOTE — Assessment & Plan Note (Addendum)
Currently on amiodarone and metoprolol

## 2022-09-25 NOTE — Assessment & Plan Note (Addendum)
Last sodium normal range 

## 2022-09-25 NOTE — Assessment & Plan Note (Addendum)
Type 2 diabetes mellitus on long-acting insulin with being on TPN.  Last hemoglobin A1c 9.8.

## 2022-09-25 NOTE — Progress Notes (Signed)
Hypotension initial response to bolus. BP dropped again. HR 50s . Clinically doing well. Suspect b blocker/antiarrythmic side effect vs toxicity. EKG ordered, glucagon . Hospitalist consulted, d/w them in detail.

## 2022-09-25 NOTE — Consult Note (Signed)
[facility] Medical Consultation  RIPLEY BOGOSIAN PZW:[id number] DOB: 05-20-1956 DOA: 09/24/2022   PCP: Hortencia Pilar, MD    Requesting physician: Dr. Dahlia Byes Date of consultation: 09/25/22  Reason for consultation: Hypotension  Chief Complaint: "I feel fine"  HPI: Nicolas Zuniga is a 66 y.o. male with hx of recently diagnosed adenocarcinoma of the colon who is currently admitted status post right colectomy, diabetes, hypertension, and A-fib status post multiple ablations currently on metoprolol and flecainide.  Patient was admitted yesterday and underwent a laparoscopic right colectomy that was converted to open right colectomy along with a cholecystectomy.  Per review of operative note there were no significant complications and EBL was minimal at 80 cc.  She was seen earlier today by Dr. Adora Fridge and was doing well.  Starting around 6 PM he began to have hypotension and the hospitalist service was consulted.  When I went to see the patient he reported that he felt quite well.  He denied any significant abdominal pain and has only received Toradol today for his pain control.  He does not have any significant pain at rest, only with movement.  He denies any chest pain or shortness of breath.  He denies feeling dizzy or lightheaded.  During initial evaluation patient was given total of 25 g of albumin, 2 L of normal saline, and started on glucagon gtt. His metoprolol and flecainide were held.   Home Medications: Prior to Admission medications   Medication Sig Start Date End Date Taking? Authorizing Provider  bisacodyl (DULCOLAX) 5 MG EC tablet Take all 4 tablets at 8 am the morning prior to your surgery. 09/14/22  Yes Pabon, Diego F, MD  Cholecalciferol (VITAMIN D) 50 MCG (2000 UT) CAPS Take 2,000 Units by mouth daily.   Yes [provider]  flecainide (TAMBOCOR) 100 MG tablet Take 100 mg by mouth 2 (two) times daily. 03/17/22  Yes [provider]  glipiZIDE (GLUCOTROL  XL) 10 MG 24 hr tablet Take 10 mg by mouth 2 (two) times daily.    Yes [provider]  metFORMIN (GLUCOPHAGE-XR) 500 MG 24 hr tablet Take 500 mg by mouth daily with breakfast. 02/10/21  Yes [provider]  metoprolol (LOPRESSOR) 100 MG tablet Take 100 mg by mouth 2 (two) times daily.   Yes [provider]  metroNIDAZOLE (FLAGYL) 500 MG tablet Take 2 tablets at 8AM, take 2 tablets at [facility], and take 2 tablets at 8PM the day prior to your surgery 09/14/22  Yes Pabon, Diego F, MD  Multiple Vitamins-Minerals (MULTIVITAMIN WITH MINERALS) tablet Take 1 tablet by mouth daily with breakfast.    Yes [provider]  neomycin (MYCIFRADIN) 500 MG tablet Take 2 tablet at 8am, take 2 tablets at 2pm, and take 2 tablets at 8pm the day prior to your surgery 09/14/22  Yes Pabon, Diego F, MD  Omega-3 Fatty Acids (FISH OIL) 1000 MG CAPS Take 1,000 mg by mouth daily.   Yes [provider]  OZEMPIC, 1 MG/DOSE, 4 MG/3ML SOPN Inject 1 mg into the skin every Wednesday. Patient stopped taking these medications 08/02/21  Yes [provider]  polyethylene glycol powder (MIRALAX) 17 GM/SCOOP powder Mix full container in 64 ounces of Gatorade or other clear liquid. NO Red 09/14/22  Yes Pabon, [place], MD  Turmeric 400 MG CAPS Take 400 mg by mouth daily.   Yes [provider]  vitamin B-12 (CYANOCOBALAMIN) 100 MCG tablet Take 500 mcg by mouth daily.   Yes [provider]  Vitamin E 400 units TABS Take 400 Units by mouth daily.   Yes [provider]  apixaban (ELIQUIS) 5 MG TABS tablet Take 5 mg by mouth 2 (two) times daily.    [provider]  Coenzyme Q10 (COQ10) 100 MG CAPS Take 100 mg by mouth daily.    [provider]    Current Inpatient Medications: I have reviewed the patient's current medications.  Allergies: No Known Allergies  Past Medical History: Past Medical History:  Diagnosis Date   Adenocarcinoma of colon ([place])  09/08/2022   Cholelithiasis    Chronic gastritis    Colon polyps    Coronary artery calcification seen on CT scan    Diastolic dysfunction    a.) TTE 04/09/2022: EF >55%, triv TR, G1DD   Diverticulosis    Hypertension    Long term current use of anticoagulant    a.) apixaban   Nephrolithiasis    Paroxysmal A-fib ([place])    a.) CHA2DS2VASc = 3 (age, HTN, T2DM);  b.) s/p DCCV 07/09/2016 (100 J x1), 12/22/2018 (120 J x1), 08/18/2021 (120 J x1), 03/11/2022 (120 J x1), 03/31/2022 (120 J x1); c.) rate/rhythm maintained on oral flecanide + metoprolol succinate; chronically anticoagulated with apixaban   T2DM (type 2 diabetes mellitus) ([place])     Past Surgical History:  Procedure Laterality Date   CARDIOVERSION N/A 12/22/2018   Procedure: CARDIOVERSION (CATH LAB);  Surgeon: Corey Skains, MD;  Location: [facility] ORS;  Service: Cardiovascular;  Laterality: N/A;   CARDIOVERSION N/A 08/18/2021   Procedure: CARDIOVERSION;  Surgeon: Corey Skains, MD;  Location: [facility] ORS;  Service: Cardiovascular;  Laterality: N/A;   CARDIOVERSION N/A 03/11/2022   Procedure: CARDIOVERSION;  Surgeon: Corey Skains, MD;  Location: [facility] ORS;  Service: Cardiovascular;  Laterality: N/A;   CARDIOVERSION N/A 03/31/2022   Procedure: CARDIOVERSION;  Surgeon: Corey Skains, MD;  Location: [facility] ORS;  Service: Cardiovascular;  Laterality: N/A;   CHOLECYSTECTOMY N/A 09/24/2022   Procedure: LAPAROSCOPIC CHOLECYSTECTOMY;  Surgeon: Jules Husbands, MD;  Location: [facility] ORS;  Service: General;  Laterality: N/A;   COLONOSCOPY WITH PROPOFOL N/A 09/08/2022   Procedure: COLONOSCOPY WITH PROPOFOL;  Surgeon: Lucilla Lame, MD;  Location: [facility] ENDOSCOPY;  Service: Endoscopy;  Laterality: N/A;   ELECTROPHYSIOLOGIC STUDY N/A 07/09/2016   Procedure: CARDIOVERSION;  Surgeon: Dionisio David, MD;  Location: [facility] ORS;  Service: Cardiovascular;  Laterality: N/A;   LAPAROSCOPIC RIGHT COLECTOMY N/A 09/24/2022   Procedure: LAPAROSCOPIC  RIGHT COLECTOMY, RNFA to assist;  Surgeon: Jules Husbands, MD;  Location: [facility] ORS;  Service: General;  Laterality: N/A;   TEE WITHOUT CARDIOVERSION N/A 07/09/2016   Procedure: TRANSESOPHAGEAL ECHOCARDIOGRAM (TEE);  Surgeon: Dionisio David, MD;  Location: [facility] ORS;  Service: Cardiovascular;  Laterality: N/A;   web fingers repaired     as a child    Social History:  Social History   Socioeconomic History   Marital status: Married    Spouse name: Not on file   Number of children: Not on file   Years of education: Not on file   Highest education level: Not on file  Occupational History   Not on file  Tobacco Use   Smoking status: Never    Passive exposure: Never   Smokeless tobacco: Never  Vaping Use   Vaping Use: Never used  Substance and Sexual Activity   Alcohol use: No   Drug use: No   Sexual activity: Not on file  Other Topics Concern  Not on file  Social History Narrative   Lives with wife, [name], with on pet, cat.   Social Determinants of Health   Financial Resource Strain: Not on file  Food Insecurity: No Food Insecurity (09/24/2022)   Hunger Vital Sign    Worried About Running Out of Food in the Last Year: Never true    Ran Out of Food in the Last Year: Never true  Transportation Needs: No Transportation Needs (09/24/2022)   PRAPARE - Hydrologist (Medical): No    Lack of Transportation (Non-Medical): No  Physical Activity: Not on file  Stress: Not on file  Social Connections: Not on file    Family History:  Family History  Problem Relation Age of Onset   Varicose Veins Mother    Heart disease Mother    Diabetes Mother    COPD Father      Review of Systems Pertinent positives and negative per HPI, all others reviewed and negative   Physical Examination: Vitals:   09/25/22 2100 09/25/22 2213 09/25/22 2258 09/25/22 2342  BP: (!) 70/41 125/85 (!) 91/57 (!) 103/44  Pulse: (!) 58 80 66 63  Resp:      Temp:       TempSrc:      SpO2:      Weight:      Height:        Physical Exam Vitals reviewed.  Constitutional:      General: He is not in acute distress.    Appearance: Normal appearance. He is obese. He is not ill-appearing, toxic-appearing or diaphoretic.  Eyes:     General: No scleral icterus. Cardiovascular:     Rate and Rhythm: Normal rate and regular rhythm.     Heart sounds: Normal heart sounds. No murmur heard.    No friction rub. No gallop.  Pulmonary:     Effort: Pulmonary effort is normal. No respiratory distress.     Breath sounds: Normal breath sounds. No wheezing or rales.  Abdominal:     General: Abdomen is flat. There is no distension.     Comments: Midline incision with honeycomb dressing present, a few very small areas of strikethrough but overall looks great  Musculoskeletal:     Right lower leg: No edema.     Left lower leg: No edema.  Skin:    General: Skin is warm and dry.  Neurological:     General: No focal deficit present.     Mental Status: He is alert and oriented to person, place, and time. Mental status is at baseline.  Psychiatric:        Mood and Affect: Mood normal.        Behavior: Behavior normal.        Thought Content: Thought content normal.      Laboratory Data: Results for orders placed or performed during the hospital encounter of 09/24/22 (from the past 48 hour(s))  Glucose, capillary     Status: None   Collection Time: 09/24/22 10:08 AM  Result Value Ref Range   Glucose-Capillary 75 70 - 99 mg/dL    Comment: Glucose reference range applies only to samples taken after fasting for at least 8 hours.  ABO/Rh     Status: None   Collection Time: 09/24/22 10:19 AM  Result Value Ref Range   ABO/RH(D)      O NEG Performed at [facility], [place], [place] [address]   Glucose, capillary  Status: Abnormal   Collection Time: 09/24/22  3:49 PM  Result Value Ref Range   Glucose-Capillary 171 (H) 70 - 99 mg/dL     Comment: Glucose reference range applies only to samples taken after fasting for at least 8 hours.  Hemoglobin A1c     Status: Abnormal   Collection Time: 09/24/22  5:44 PM  Result Value Ref Range   Hgb A1c MFr Bld 9.8 (H) 4.8 - 5.6 %    Comment: (NOTE) Pre diabetes:          5.7%-6.4%  Diabetes:              >6.4%  Glycemic control for   <7.0% adults with diabetes    Mean Plasma Glucose 234.56 mg/dL    Comment: Performed at [facility] [address], [place], [place] [address]  Glucose, capillary     Status: Abnormal   Collection Time: 09/24/22  7:56 PM  Result Value Ref Range   Glucose-Capillary 224 (H) 70 - 99 mg/dL    Comment: Glucose reference range applies only to samples taken after fasting for at least 8 hours.  Basic metabolic panel     Status: Abnormal   Collection Time: 09/25/22  6:13 AM  Result Value Ref Range   Sodium 131 (L) 135 - 145 mmol/L   Potassium 4.3 3.5 - 5.1 mmol/L   Chloride 100 98 - 111 mmol/L   CO2 17 (L) 22 - 32 mmol/L   Glucose, Bld 232 (H) 70 - 99 mg/dL    Comment: Glucose reference range applies only to samples taken after fasting for at least 8 hours.   BUN 22 8 - 23 mg/dL   Creatinine, Ser 1.23 0.61 - 1.24 mg/dL   Calcium 8.4 (L) 8.9 - 10.3 mg/dL   GFR, Estimated >60 >60 mL/min    Comment: (NOTE) Calculated using the CKD-EPI Creatinine Equation (2021)    Anion gap 14 5 - 15    Comment: Performed at [facility], [address] [place], [place] [address]  Magnesium     Status: None   Collection Time: 09/25/22  6:13 AM  Result Value Ref Range   Magnesium 1.7 1.7 - 2.4 mg/dL    Comment: Performed at [facility], [address] [place], [place] [address]  CBC     Status: Abnormal   Collection Time: 09/25/22  6:13 AM  Result Value Ref Range   WBC 16.9 (H) 4.0 - 10.5 K/uL   RBC 3.86 (L) 4.22 - 5.81 MIL/uL   Hemoglobin 9.2 (L) 13.0 - 17.0 g/dL   HCT 29.8 (L) 39.0 - 52.0 %   MCV 77.2 (L) 80.0 - 100.0 fL   MCH  23.8 (L) 26.0 - 34.0 pg   MCHC 30.9 30.0 - 36.0 g/dL   RDW 14.1 11.5 - 15.5 %   Platelets 225 150 - 400 K/uL   nRBC 0.0 0.0 - 0.2 %    Comment: Performed at [facility], [address] [place], [place] [address]  Glucose, capillary     Status: Abnormal   Collection Time: 09/25/22  7:54 AM  Result Value Ref Range   Glucose-Capillary 190 (H) 70 - 99 mg/dL    Comment: Glucose reference range applies only to samples taken after fasting for at least 8 hours.  Glucose, capillary     Status: Abnormal   Collection Time: 09/25/22 11:24 AM  Result Value Ref Range   Glucose-Capillary 148 (H) 70 - 99  mg/dL    Comment: Glucose reference range applies only to samples taken after fasting for at least 8 hours.  Glucose, capillary     Status: Abnormal   Collection Time: 09/25/22  4:06 PM  Result Value Ref Range   Glucose-Capillary 108 (H) 70 - 99 mg/dL    Comment: Glucose reference range applies only to samples taken after fasting for at least 8 hours.  Glucose, capillary     Status: Abnormal   Collection Time: 09/25/22  8:57 PM  Result Value Ref Range   Glucose-Capillary 150 (H) 70 - 99 mg/dL    Comment: Glucose reference range applies only to samples taken after fasting for at least 8 hours.  CBC     Status: Abnormal   Collection Time: 09/25/22  9:38 PM  Result Value Ref Range   WBC 16.0 (H) 4.0 - 10.5 K/uL   RBC 3.58 (L) 4.22 - 5.81 MIL/uL   Hemoglobin 8.6 (L) 13.0 - 17.0 g/dL   HCT 28.0 (L) 39.0 - 52.0 %   MCV 78.2 (L) 80.0 - 100.0 fL   MCH 24.0 (L) 26.0 - 34.0 pg   MCHC 30.7 30.0 - 36.0 g/dL   RDW 14.3 11.5 - 15.5 %   Platelets 181 150 - 400 K/uL   nRBC 0.0 0.0 - 0.2 %    Comment: Performed at [facility], [address], [place], [place] [address]  Basic metabolic panel     Status: Abnormal   Collection Time: 09/25/22  9:38 PM  Result Value Ref Range   Sodium 130 (L) 135 - 145 mmol/L   Potassium 3.7 3.5 - 5.1 mmol/L   Chloride 102 98 - 111 mmol/L   CO2 17 (L)  22 - 32 mmol/L   Glucose, Bld 161 (H) 70 - 99 mg/dL    Comment: Glucose reference range applies only to samples taken after fasting for at least 8 hours.   BUN 22 8 - 23 mg/dL   Creatinine, Ser 1.93 (H) 0.61 - 1.24 mg/dL   Calcium 8.0 (L) 8.9 - 10.3 mg/dL   GFR, Estimated 38 (L) >60 mL/min    Comment: (NOTE) Calculated using the CKD-EPI Creatinine Equation (2021)    Anion gap 11 5 - 15    Comment: Performed at [facility], [address] [place], [place] [address]    Imaging Studies: No results found.  EKG (my read): Sinus rhythm, no ischemic changes, unchanged compared to prior  Impression/Recommendations  Principal Problem:   S/P right colectomy Active Problems:   Diabetes ([place])   Essential hypertension   Paroxysmal A-fib ([facility])   S/P partial resection of colon   Hypotension   Hyponatremia   AKI (acute kidney injury) ([place])   HOMER MILLER is a 66 y.o. male with hx of recently diagnosed adenocarcinoma of the colon who is currently admitted status post right colectomy, diabetes, hypertension, and A-fib status post multiple ablations currently on metoprolol and flecainide.  Hospitalist service has been consulted for postoperative hypotension.  Hypotension Patient with brief episode of relative hypotension that is since resolved after IV fluids.  On my assessment during this period of hypotension patient was mentating normally and did not have any concerning findings.  Review of labs shows that patient has mild AKI, with creatinine elevated from 1.2-1.9, which adds weight to my impression that this was driven primarily by hypovolemia. CBC and BMP otherwise without significant change from prior.  Patient given dose of glucagon due to concern for  possible beta-blocker toxicity, however he lacks many of the stigmata of his condition, his dose of beta-blocker at 100 mg metoprolol twice daily is relatively low, and flecainide does not appear to be associated with beta-blocker  toxicity.  - Carefully watch I's and O's to maintain euvolemia once patient is consistently normotensive, given lack of significant underlying cardiac disease okay for more fluid resuscitation if necessary - Would discontinue glucagon as its continued use may make his glycemic control difficult and beta-blocker toxicity is unlikely to be the cause of his hypotension - Agree with holding metoprolol and flecainide for the time being, would reassess in a.m. and consider resuming at that time.  Would not be unreasonable to reduce his dose of metoprolol.  Diabetes ([place]) Agree with insulin sliding scale correction factor  Paroxysmal A-fib ([facility]) Agree with holding metoprolol and flecainide for time being, consider resuming in a.m.  Would not be unreasonable to reduce dose of beta-blocker in a.m.  Hyponatremia Likely hypovolemic, trend BMP.  AKI (acute kidney injury) ([place]) Likely prerenal in setting of surgery as well as patient reported decreased p.o. intake in the last several days, trend BMP status post fluid resuscitation.     Code Status:  Full Code DVT Prophylaxis: per primary team     Our team will follow up again tomorrow. Please contact us if we can be of assistance in the meanwhile. Thank you for this consultation.  Clarnce Flock  [facility] Pager on [email or web address]   09/25/2022, 11:48 PM

## 2022-09-25 NOTE — Progress Notes (Signed)
Patient taken to room 230 via inpatient bed. Patient set up with bedside table and belongings in reach.  Bedside report given to [facility].

## 2022-09-26 ENCOUNTER — Inpatient Hospital Stay: Payer: Medicare Other

## 2022-09-26 DIAGNOSIS — Z9049 Acquired absence of other specified parts of digestive tract: Secondary | ICD-10-CM | POA: Diagnosis not present

## 2022-09-26 LAB — COMPREHENSIVE METABOLIC PANEL
ALT: 16 U/L (ref 0–44)
ALT: 14 U/L (ref 0–44)
AST: 29 U/L (ref 15–41)
AST: 32 U/L (ref 15–41)
Albumin: 2.7 g/dL — ABNORMAL LOW (ref 3.5–5.0)
Albumin: 2.9 g/dL — ABNORMAL LOW (ref 3.5–5.0)
Alkaline Phosphatase: 43 U/L (ref 38–126)
Alkaline Phosphatase: 40 U/L (ref 38–126)
Anion gap: 10 (ref 5–15)
Anion gap: 13 (ref 5–15)
BUN: 25 mg/dL — ABNORMAL HIGH (ref 8–23)
BUN: 23 mg/dL (ref 8–23)
CO2: 17 mmol/L — ABNORMAL LOW (ref 22–32)
CO2: 15 mmol/L — ABNORMAL LOW (ref 22–32)
Calcium: 8.3 mg/dL — ABNORMAL LOW (ref 8.9–10.3)
Calcium: 8.9 mg/dL (ref 8.9–10.3)
Chloride: 105 mmol/L (ref 98–111)
Chloride: 104 mmol/L (ref 98–111)
Creatinine, Ser: 1.66 mg/dL — ABNORMAL HIGH (ref 0.61–1.24)
Creatinine, Ser: 1.45 mg/dL — ABNORMAL HIGH (ref 0.61–1.24)
GFR, Estimated: 45 mL/min — ABNORMAL LOW (ref >60)
GFR, Estimated: 53 mL/min — ABNORMAL LOW (ref >60)
Glucose, Bld: 204 mg/dL — ABNORMAL HIGH (ref 70–99)
Glucose, Bld: 103 mg/dL — ABNORMAL HIGH (ref 70–99)
Potassium: 4.3 mmol/L (ref 3.5–5.1)
Potassium: 4.7 mmol/L (ref 3.5–5.1)
Sodium: 132 mmol/L — ABNORMAL LOW (ref 135–145)
Sodium: 132 mmol/L — ABNORMAL LOW (ref 135–145)
Total Bilirubin: 0.5 mg/dL (ref 0.3–1.2)
Total Bilirubin: 0.9 mg/dL (ref 0.3–1.2)
Total Protein: 5.4 g/dL — ABNORMAL LOW (ref 6.5–8.1)
Total Protein: 6 g/dL — ABNORMAL LOW (ref 6.5–8.1)

## 2022-09-26 LAB — CBC
HCT: 28.4 % — ABNORMAL LOW (ref 39.0–52.0)
HCT: 30 % — ABNORMAL LOW (ref 39.0–52.0)
Hemoglobin: 8.8 g/dL — ABNORMAL LOW (ref 13.0–17.0)
Hemoglobin: 9.1 g/dL — ABNORMAL LOW (ref 13.0–17.0)
MCH: 24.2 pg — ABNORMAL LOW (ref 26.0–34.0)
MCH: 24 pg — ABNORMAL LOW (ref 26.0–34.0)
MCHC: 31 g/dL (ref 30.0–36.0)
MCHC: 30.3 g/dL (ref 30.0–36.0)
MCV: 78 fL — ABNORMAL LOW (ref 80.0–100.0)
MCV: 79.2 fL — ABNORMAL LOW (ref 80.0–100.0)
Platelets: 198 10*3/uL (ref 150–400)
Platelets: 193 10*3/uL (ref 150–400)
RBC: 3.64 MIL/uL — ABNORMAL LOW (ref 4.22–5.81)
RBC: 3.79 MIL/uL — ABNORMAL LOW (ref 4.22–5.81)
RDW: 14.2 % (ref 11.5–15.5)
RDW: 14.7 % (ref 11.5–15.5)
WBC: 15.3 10*3/uL — ABNORMAL HIGH (ref 4.0–10.5)
WBC: 9.6 10*3/uL (ref 4.0–10.5)
nRBC: 0 % (ref 0.0–0.2)
nRBC: 0 % (ref 0.0–0.2)

## 2022-09-26 LAB — GLUCOSE, CAPILLARY
Glucose-Capillary: 148 mg/dL — ABNORMAL HIGH (ref 70–99)
Glucose-Capillary: 114 mg/dL — ABNORMAL HIGH (ref 70–99)
Glucose-Capillary: 104 mg/dL — ABNORMAL HIGH (ref 70–99)
Glucose-Capillary: 121 mg/dL — ABNORMAL HIGH (ref 70–99)

## 2022-09-26 LAB — HIV ANTIBODY (ROUTINE TESTING W REFLEX): HIV Screen 4th Generation wRfx: NONREACTIVE

## 2022-09-26 MED ORDER — ALBUMIN HUMAN 25 % IV SOLN
25.0000 g | Freq: Once | INTRAVENOUS | Status: AC
  Administered 2022-09-26: 25 g via INTRAVENOUS
  Filled 2022-09-26: qty 100

## 2022-09-26 MED ORDER — METOPROLOL TARTRATE 5 MG/5ML IV SOLN
5.0000 mg | Freq: Once | INTRAVENOUS | Status: AC
  Administered 2022-09-26: 5 mg via INTRAVENOUS
  Filled 2022-09-26: qty 5

## 2022-09-26 MED ORDER — KETOROLAC TROMETHAMINE 15 MG/ML IJ SOLN
15.0000 mg | Freq: Four times a day (QID) | INTRAMUSCULAR | Status: DC
  Administered 2022-09-26 – 2022-09-27 (×4): 15 mg via INTRAVENOUS
  Filled 2022-09-26 (×4): qty 1

## 2022-09-26 MED ORDER — SODIUM CHLORIDE 0.9 % IV BOLUS
500.0000 mL | Freq: Once | INTRAVENOUS | Status: AC
  Administered 2022-09-26: 500 mL via INTRAVENOUS

## 2022-09-26 NOTE — Progress Notes (Signed)
Events noted Hypotension and bradycardia responded to crystalloids He has only some soreness and 1/10 pain Took breakfast, no nausea KUB w small free air likely post op as  is less 36 hrs from surgery Creat improved but w aki Hb stable HR now in the 70s  PE NAD Abd: soft, mildly distended, appropriate incisional tenderness , no peritonitis  A/P Hypotension  and bradycardia Lopressor, lyrica, scopolamine and flecainide and lovenox ( potential bleed)  on hold AKI from hypotension I do not suspect an intra-abdominal complication. Likely related to interaction w antiarrythmic, b blocker, lyrica and scopolamine Decrease dose of toradol Labs in am Appreciate hospitalist assistance

## 2022-09-26 NOTE — Progress Notes (Signed)
PROGRESS NOTE - follow up on consult    Nicolas Zuniga   BZJ:[id number] DOB: [birth year]  DOA: 09/24/2022 Date of Service: 09/26/22 PCP: Hortencia Pilar, MD  Requesting physician: Dr. Dahlia Byes Date of initial consultation: 09/25/22  Reason for consultation: Hypotension     Brief Narrative / Hospital Course:  Nicolas Zuniga is a 66 y.o. male with hx of recently diagnosed adenocarcinoma of the colon who is currently admitted status post right colectomy, diabetes, hypertension, and A-fib status post multiple ablations currently on metoprolol and flecainide.  09/24/2022 - Underwent a laparoscopic right colectomy that was converted to open right colectomy along with a cholecystectomy - no significant complications and EBL was minimal at 80 cc.   10/20: Seen early morning by Dr. Dahlia Byes (general surgery) and was doing well.  Starting around 6 PM he began to have hypotension 70/41 and the hospitalist service was consulted. Pt was given total of 25 g of albumin, 2 L of normal saline, and started on glucagon gtt concern for beta blocker toxicity. His metoprolol and flecainide were held. BP improved. Mild AKI on labs Cr 1.93 down form admission 1.23, likely hypovolemia. D/c glucagon. Strict I&O. Continue hold beta blocker and flecainide, may reduce dose metoprolol on discharge.  10/21: BP soft but improved 115/53, 117/64. Cr improved 1.66. Hgb stable.       ASSESSMENT & PLAN:   Principal Problem:   S/P right colectomy Active Problems:   Diabetes ([place])   Essential hypertension   Paroxysmal A-fib (HCC)   S/P partial resection of colon   Hypotension   Hyponatremia   AKI (acute kidney injury) ([place])   Hypotension Likely hypovolemia I&O given lack of significant underlying cardiac disease okay for more fluid resuscitation if necessary Continue holding metoprolol and flecainide for the time being.   Would not be unreasonable to reduce his dose of metoprolol or continue to hold on discharge    Diabetes (HCC) insulin sliding scale correction factor  Paroxysmal A-fib (HCC) holding metoprolol and flecainide for time being  Hyponatremia Likely hypovolemic trend BMP --> improved this morning   AKI (acute kidney injury) ([place]) Likely prerenal in setting of surgery as well as patient reported decreased p.o. intake in the last several days trend BMP status post fluid resuscitation --> not quite back to baseline but Cr is improved             Subjective:  Patient reports doing well, abdominal bloating and postop pain but no CP/SOB, no lightheadedness. Support person is at bedside on rounds.        Objective:  Vitals:   09/25/22 2342 09/26/22 0250 09/26/22 0448 09/26/22 0835  BP: (!) 103/44 117/64 (!) 115/53 (!) 99/54  Pulse: 63 71 62 70  Resp:   18 16  Temp:   97.6 F (36.4 C) 97.8 F (36.6 C)  TempSrc:   Oral Oral  SpO2:   96% 98%  Weight:      Height:        Intake/Output Summary (Last 24 hours) at 09/26/2022 1356 Last data filed at 09/26/2022 0400 Gross per 24 hour  Intake 2388.8 ml  Output --  Net 2388.8 ml   Filed Weights   09/24/22 1038  Weight: 124.7 kg    Examination:  Constitutional:  VS as above General Appearance: alert, well-developed, well-nourished, NAD Respiratory: Normal respiratory effort No wheeze No rhonchi No rales Cardiovascular: S1/S2 normal No murmur No rub/gallop auscultated No lower extremity edema Musculoskeletal:  Symmetrical movement in all  extremities Neurological: No cranial nerve deficit on limited exam Alert Psychiatric: Normal judgment/insight Normal mood and affect       Scheduled Medications:   acetaminophen  1,000 mg Oral Q6H   insulin aspart  0-15 Units Subcutaneous TID WC   insulin aspart  0-5 Units Subcutaneous QHS   insulin aspart  4 Units Subcutaneous TID WC   ketorolac  15 mg Intravenous Q6H   pantoprazole (PROTONIX) IV  40 mg Intravenous QHS    Continuous Infusions:   sodium chloride 75 mL/hr at 09/26/22 1200    PRN Medications:  diphenhydrAMINE **OR** diphenhydrAMINE, morphine injection, ondansetron **OR** ondansetron (ZOFRAN) IV, oxyCODONE, prochlorperazine **OR** prochlorperazine  Antimicrobials:  Anti-infectives (From admission, onward)    Start     Dose/Rate Route Frequency Ordered Stop   09/24/22 2200  cefoTEtan (CEFOTAN) 2 g in sodium chloride 0.9 % 100 mL IVPB        2 g 200 mL/hr over 30 Minutes Intravenous Every 12 hours 09/24/22 1717 09/25/22 0954   09/24/22 1530  metroNIDAZOLE (FLAGYL) IVPB 500 mg        500 mg 100 mL/hr over 60 Minutes Intravenous  Once 09/24/22 1515 09/24/22 1525   09/24/22 0600  ceFAZolin (ANCEF) IVPB 3g/100 mL premix  Status:  Discontinued        3 g 200 mL/hr over 30 Minutes Intravenous On call to O.R. 09/24/22 0112 09/24/22 1717       Data Reviewed: I have personally reviewed following labs and imaging studies  CBC: Recent Labs  Lab 09/25/22 0613 09/25/22 2138 09/26/22 0412  WBC 16.9* 16.0* 15.3*  HGB 9.2* 8.6* 8.8*  HCT 29.8* 28.0* 28.4*  MCV 77.2* 78.2* 78.0*  PLT 225 181 989   Basic Metabolic Panel: Recent Labs  Lab 09/25/22 0613 09/25/22 2138 09/26/22 0412  NA 131* 130* 132*  K 4.3 3.7 4.3  CL 100 102 105  CO2 17* 17* 17*  GLUCOSE 232* 161* 204*  BUN 22 22 25*  CREATININE 1.23 1.93* 1.66*  CALCIUM 8.4* 8.0* 8.3*  MG 1.7  --   --    GFR: Estimated Creatinine Clearance: 61.4 mL/min (A) (by C-G formula based on SCr of 1.66 mg/dL (H)). Liver Function Tests: Recent Labs  Lab 09/26/22 0412  AST 29  ALT 16  ALKPHOS 43  BILITOT 0.5  PROT 5.4*  ALBUMIN 2.7*   No results for input(s): "LIPASE", "AMYLASE" in the last 168 hours. No results for input(s): "AMMONIA" in the last 168 hours. Coagulation Profile: No results for input(s): "INR", "PROTIME" in the last 168 hours. Cardiac Enzymes: No results for input(s): "CKTOTAL", "CKMB", "CKMBINDEX", "TROPONINI" in the last 168 hours. BNP  (last 3 results) No results for input(s): "PROBNP" in the last 8760 hours. HbA1C: Recent Labs    09/24/22 1744  HGBA1C 9.8*   CBG: Recent Labs  Lab 09/25/22 1124 09/25/22 1606 09/25/22 2057 09/26/22 0836 09/26/22 1120  GLUCAP 148* 108* 150* 148* 114*   Lipid Profile: No results for input(s): "CHOL", "HDL", "LDLCALC", "TRIG", "CHOLHDL", "LDLDIRECT" in the last 72 hours. Thyroid Function Tests: No results for input(s): "TSH", "T4TOTAL", "FREET4", "T3FREE", "THYROIDAB" in the last 72 hours. Anemia Panel: No results for input(s): "VITAMINB12", "FOLATE", "FERRITIN", "TIBC", "IRON", "RETICCTPCT" in the last 72 hours. Urine analysis:    Component Value Date/Time   COLORURINE YELLOW 08/08/2021 1249   APPEARANCEUR CLEAR 08/08/2021 1249   APPEARANCEUR Cloudy 06/13/2012 1253   LABSPEC >1.030 (H) 08/08/2021 1249   LABSPEC 1.026 06/13/2012 1253  PHURINE 5.0 08/08/2021 1249   GLUCOSEU NEGATIVE 08/08/2021 1249   GLUCOSEU 50 mg/dL 06/13/2012 1253   HGBUR TRACE (A) 08/08/2021 1249   BILIRUBINUR NEGATIVE 08/08/2021 1249   BILIRUBINUR Negative 06/13/2012 1253   KETONESUR TRACE (A) 08/08/2021 1249   PROTEINUR NEGATIVE 08/08/2021 1249   NITRITE NEGATIVE 08/08/2021 1249   LEUKOCYTESUR NEGATIVE 08/08/2021 1249   LEUKOCYTESUR Negative 06/13/2012 1253   Sepsis Labs: '@LABRCNTIP'$ (procalcitonin:4,lacticidven:4)  No results found for this or any previous visit (from the past 240 hour(s)).       Radiology Studies: No results found.          LOS: 2 days       Emeterio Reeve, DO [facility] 09/26/2022, 1:56 PM   Staff may message me via secure chat in [place]  but this may not receive immediate response,  please page for urgent matters!  If 7PM-7AM, please contact night-coverage [email or web address]  Dictation software was used to generate the above note. Typos may occur and escape review, as with typed/written notes. Please contact Dr Sheppard Coil directly for clarity  if needed.

## 2022-09-26 NOTE — Progress Notes (Signed)
   09/26/22 2239  Assess: MEWS Score  Temp 97.7 F (36.5 C)  BP 100/65  MAP (mmHg) 76  Pulse Rate (!) 150  Resp 20  SpO2 97 %  O2 Device Room Air  Assess: MEWS Score  MEWS Temp 0  MEWS Systolic 1  MEWS Pulse 3  MEWS RR 0  MEWS LOC 0  MEWS Score 4  MEWS Score Color Red  Assess: if the MEWS score is Yellow or Red  Were vital signs taken at a resting state? Yes  Focused Assessment Change from prior assessment (see assessment flowsheet)  Does the patient meet 2 or more of the SIRS criteria? Yes  Does the patient have a confirmed or suspected source of infection? No  MEWS guidelines implemented *See Row Information* Yes  Treat  MEWS Interventions Escalated (See documentation below)  Pain Scale 0-10  Pain Score 0  Take Vital Signs  Increase Vital Sign Frequency  Red: Q 1hr X 4 then Q 4hr X 4, if remains red, continue Q 4hrs  Escalate  MEWS: Escalate Red: discuss with charge nurse/RN and provider, consider discussing with RRT  Notify: Charge Nurse/RN  Name of Charge Nurse/RN Notified Almyra Free, RN  Date Charge Nurse/RN Notified 09/26/22  Time Charge Nurse/RN Notified 2239  Notify: Provider  Provider Name/Title Sharion Settler, NP  Date Provider Notified 09/26/22  Time Provider Notified 2245  Method of Notification Page  Notification Reason Change in status  Provider response See new orders  Date of Provider Response 09/26/22  Time of Provider Response 2245  Assess: SIRS CRITERIA  SIRS Temperature  0  SIRS Pulse 1  SIRS Respirations  0  SIRS WBC 1  SIRS Score Sum  2

## 2022-09-26 NOTE — Hospital Course (Addendum)
Nicolas Zuniga is a 66 y.o. male with hx of recently diagnosed adenocarcinoma of the colon who is currently admitted status post right colectomy, diabetes, hypertension, and A-fib status post multiple ablations currently on metoprolol and flecainide.  09/24/2022 - Underwent a laparoscopic right colectomy that was converted to open right colectomy along with a cholecystectomy - no significant complications and EBL was minimal at 80 cc.   10/20: Seen early morning by Dr. Dahlia Byes (general surgery) and was doing well.  Starting around 6 PM he began to have hypotension 70/41 and the hospitalist service was consulted. Pt was given total of 25 g of albumin, 2 L of normal saline, and started on glucagon gtt concern for beta blocker toxicity. His metoprolol and flecainide were held. BP improved. Mild AKI on labs Cr 1.93 down form admission 1.23, likely hypovolemia. D/c glucagon. Strict I&O. Continue hold beta blocker and flecainide, may reduce dose metoprolol on discharge.  10/21: BP soft but improved 115/53, 117/64. Cr improved 1.66. Hgb stable.  Rapid response overnight, A-fib RVR.  Transferred to stepdown, amiodarone infusion, minimal response, diltiazem infusion.  Dr. Humphrey Rolls, cardiology, aware. Dr Dahlia Byes ordering CT chest/abdomen/pelvis, starting heparin drip. CT no PE, (+) Small bowel leak, certainly no contrast extravasation and seems contained --> plan repeat CT, hold off on surgical intervention unless emergent, starting abx.  10/23: Patient went for repeat CT noting increased extravasation concerning for anastomotic leak.  Patient taken back to the OR and well no leak found, underwent bowel follow-through and washout.  Postop, patient in ICU and still intubated.  ABG notes severe respiratory acidosis.  Note that should patient remain on ventilator, will have critical care assumed medical management in conjunction with surgery. 10/24 for worsening acute renal failure, nephrology consulted.  Patient with bradycardia  overnight and Cardizem and Precedex discontinued. 10/25 extubated, off vasopressors, weaned off insulin drip to subcutaneous insulin 10/28.  CT scan showing.  Decreasing intraperitoneal free gas, free fluid on the left liver similar.  Multiple fluid collections in the mesentery. 10/30 interventional radiology performed a IR drainage procedure.  Patient ordered a unit of packed red blood cells on a hemoglobin of 7.2.  I started Lasix for 2 doses.

## 2022-09-27 ENCOUNTER — Inpatient Hospital Stay: Payer: Medicare Other

## 2022-09-27 ENCOUNTER — Inpatient Hospital Stay: Payer: Self-pay

## 2022-09-27 ENCOUNTER — Encounter: Payer: Self-pay | Admitting: Surgery

## 2022-09-27 ENCOUNTER — Other Ambulatory Visit: Payer: Self-pay

## 2022-09-27 DIAGNOSIS — Z9049 Acquired absence of other specified parts of digestive tract: Secondary | ICD-10-CM | POA: Diagnosis not present

## 2022-09-27 LAB — BASIC METABOLIC PANEL
Anion gap: 12 (ref 5–15)
BUN: 22 mg/dL (ref 8–23)
CO2: 18 mmol/L — ABNORMAL LOW (ref 22–32)
Calcium: 8.9 mg/dL (ref 8.9–10.3)
Chloride: 103 mmol/L (ref 98–111)
Creatinine, Ser: 1.45 mg/dL — ABNORMAL HIGH (ref 0.61–1.24)
GFR, Estimated: 53 mL/min — ABNORMAL LOW (ref >60)
Glucose, Bld: 84 mg/dL (ref 70–99)
Potassium: 4.3 mmol/L (ref 3.5–5.1)
Sodium: 133 mmol/L — ABNORMAL LOW (ref 135–145)

## 2022-09-27 LAB — CBC
HCT: 31.1 % — ABNORMAL LOW (ref 39.0–52.0)
Hemoglobin: 9.3 g/dL — ABNORMAL LOW (ref 13.0–17.0)
MCH: 23.7 pg — ABNORMAL LOW (ref 26.0–34.0)
MCHC: 29.9 g/dL — ABNORMAL LOW (ref 30.0–36.0)
MCV: 79.3 fL — ABNORMAL LOW (ref 80.0–100.0)
Platelets: 208 10*3/uL (ref 150–400)
RBC: 3.92 MIL/uL — ABNORMAL LOW (ref 4.22–5.81)
RDW: 14.9 % (ref 11.5–15.5)
WBC: 8.5 10*3/uL (ref 4.0–10.5)
nRBC: 0 % (ref 0.0–0.2)

## 2022-09-27 LAB — MRSA NEXT GEN BY PCR, NASAL: MRSA by PCR Next Gen: NOT DETECTED

## 2022-09-27 LAB — GLUCOSE, CAPILLARY
Glucose-Capillary: 105 mg/dL — ABNORMAL HIGH (ref 70–99)
Glucose-Capillary: 108 mg/dL — ABNORMAL HIGH (ref 70–99)
Glucose-Capillary: 61 mg/dL — ABNORMAL LOW (ref 70–99)
Glucose-Capillary: 64 mg/dL — ABNORMAL LOW (ref 70–99)
Glucose-Capillary: 66 mg/dL — ABNORMAL LOW (ref 70–99)
Glucose-Capillary: 74 mg/dL (ref 70–99)
Glucose-Capillary: 74 mg/dL (ref 70–99)
Glucose-Capillary: 91 mg/dL (ref 70–99)
Glucose-Capillary: 91 mg/dL (ref 70–99)
Glucose-Capillary: 98 mg/dL (ref 70–99)

## 2022-09-27 LAB — MAGNESIUM: Magnesium: 1.5 mg/dL — ABNORMAL LOW (ref 1.7–2.4)

## 2022-09-27 LAB — HEPARIN LEVEL (UNFRACTIONATED)
Heparin Unfractionated: <0.1 IU/mL — ABNORMAL LOW (ref 0.30–0.70)
Heparin Unfractionated: <0.1 IU/mL — ABNORMAL LOW (ref 0.30–0.70)

## 2022-09-27 LAB — PROTIME-INR
INR: 2 — ABNORMAL HIGH (ref 0.8–1.2)
Prothrombin Time: 22.1 seconds — ABNORMAL HIGH (ref 11.4–15.2)

## 2022-09-27 LAB — TSH: TSH: 3.741 u[IU]/mL (ref 0.350–4.500)

## 2022-09-27 LAB — T4, FREE: Free T4: 0.94 ng/dL (ref 0.61–1.12)

## 2022-09-27 LAB — APTT: aPTT: 44 seconds — ABNORMAL HIGH (ref 24–36)

## 2022-09-27 MED ORDER — DILTIAZEM LOAD VIA INFUSION
10.0000 mg | Freq: Once | INTRAVENOUS | Status: AC
  Administered 2022-09-27: 10 mg via INTRAVENOUS
  Filled 2022-09-27: qty 10

## 2022-09-27 MED ORDER — AMIODARONE LOAD VIA INFUSION
150.0000 mg | Freq: Once | INTRAVENOUS | Status: DC
  Filled 2022-09-27: qty 83.34

## 2022-09-27 MED ORDER — AMIODARONE HCL IN DEXTROSE 360-4.14 MG/200ML-% IV SOLN
INTRAVENOUS | Status: AC
  Filled 2022-09-27: qty 200

## 2022-09-27 MED ORDER — STERILE WATER FOR INJECTION IV SOLN
INTRAVENOUS | Status: DC
  Filled 2022-09-27 (×2): qty 1000

## 2022-09-27 MED ORDER — AMIODARONE IV BOLUS ONLY 150 MG/100ML
150.0000 mg | Freq: Once | INTRAVENOUS | Status: AC
  Administered 2022-09-27: 150 mg via INTRAVENOUS
  Filled 2022-09-27: qty 100

## 2022-09-27 MED ORDER — IOHEXOL 9 MG/ML PO SOLN
500.0000 mL | ORAL | Status: AC
  Administered 2022-09-27 (×2): 500 mL via ORAL

## 2022-09-27 MED ORDER — AMIODARONE LOAD VIA INFUSION
150.0000 mg | Freq: Once | INTRAVENOUS | Status: AC
  Administered 2022-09-27: 150 mg via INTRAVENOUS
  Filled 2022-09-27: qty 83.34

## 2022-09-27 MED ORDER — SODIUM BICARBONATE 8.4 % IV SOLN
50.0000 meq | Freq: Once | INTRAVENOUS | Status: AC
  Administered 2022-09-27: 50 meq via INTRAVENOUS
  Filled 2022-09-27: qty 50

## 2022-09-27 MED ORDER — MAGNESIUM SULFATE 2 GM/50ML IV SOLN
2.0000 g | Freq: Once | INTRAVENOUS | Status: AC
  Administered 2022-09-27: 2 g via INTRAVENOUS
  Filled 2022-09-27: qty 50

## 2022-09-27 MED ORDER — METOPROLOL TARTRATE 25 MG PO TABS: 12.5000 mg | ORAL_TABLET | Freq: Two times a day (BID) | ORAL | Status: DC

## 2022-09-27 MED ORDER — IOHEXOL 350 MG/ML SOLN
100.0000 mL | Freq: Once | INTRAVENOUS | Status: AC | PRN
  Administered 2022-09-27: 100 mL via INTRAVENOUS

## 2022-09-27 MED ORDER — SODIUM CHLORIDE 0.9 % IV BOLUS
250.0000 mL | Freq: Once | INTRAVENOUS | Status: AC
  Administered 2022-09-27: 250 mL via INTRAVENOUS

## 2022-09-27 MED ORDER — AMIODARONE HCL IN DEXTROSE 360-4.14 MG/200ML-% IV SOLN
60.0000 mg/h | INTRAVENOUS | Status: DC
  Administered 2022-09-27 (×2): 60 mg/h via INTRAVENOUS
  Filled 2022-09-27: qty 200

## 2022-09-27 MED ORDER — AMIODARONE HCL IN DEXTROSE 360-4.14 MG/200ML-% IV SOLN
30.0000 mg/h | INTRAVENOUS | Status: DC
  Administered 2022-09-27 – 2022-10-02 (×9): 30 mg/h via INTRAVENOUS
  Filled 2022-09-27 (×10): qty 200

## 2022-09-27 MED ORDER — PIPERACILLIN-TAZOBACTAM 3.375 G IVPB
3.3750 g | Freq: Three times a day (TID) | INTRAVENOUS | Status: DC
  Administered 2022-09-27 – 2022-10-04 (×21): 3.375 g via INTRAVENOUS
  Filled 2022-09-27 (×21): qty 50

## 2022-09-27 MED ORDER — LACTATED RINGERS IV SOLN: Freq: Once | INTRAVENOUS | Status: AC

## 2022-09-27 MED ORDER — AMIODARONE IV BOLUS ONLY 150 MG/100ML
INTRAVENOUS | Status: AC
  Administered 2022-09-27: 150 mg
  Filled 2022-09-27: qty 100

## 2022-09-27 MED ORDER — CHLORHEXIDINE GLUCONATE CLOTH 2 % EX PADS
6.0000 | MEDICATED_PAD | Freq: Every day | CUTANEOUS | Status: DC
  Administered 2022-10-01 – 2022-10-19 (×19): 6 via TOPICAL

## 2022-09-27 MED ORDER — ORAL CARE MOUTH RINSE: 15.0000 mL | OROMUCOSAL | Status: DC | PRN

## 2022-09-27 MED ORDER — HEPARIN (PORCINE) 25000 UT/250ML-% IV SOLN
2250.0000 [IU]/h | INTRAVENOUS | Status: DC
  Administered 2022-09-27: 1550 [IU]/h via INTRAVENOUS
  Administered 2022-09-28: 1950 [IU]/h via INTRAVENOUS
  Filled 2022-09-27 (×2): qty 250

## 2022-09-27 MED ORDER — SODIUM CHLORIDE 0.9% FLUSH: 10.0000 mL | INTRAVENOUS | Status: DC | PRN

## 2022-09-27 MED ORDER — MAGNESIUM OXIDE -MG SUPPLEMENT 400 (240 MG) MG PO TABS
400.0000 mg | ORAL_TABLET | Freq: Two times a day (BID) | ORAL | Status: DC
  Administered 2022-09-27: 400 mg via ORAL
  Filled 2022-09-27: qty 1

## 2022-09-27 MED ORDER — SODIUM CHLORIDE 0.9% FLUSH
10.0000 mL | Freq: Two times a day (BID) | INTRAVENOUS | Status: DC
  Administered 2022-09-28 – 2022-09-29 (×3): 10 mL
  Administered 2022-09-30: 30 mL
  Administered 2022-09-30 – 2022-10-02 (×4): 10 mL
  Administered 2022-10-02 – 2022-10-04 (×3): 20 mL
  Administered 2022-10-04 – 2022-10-10 (×13): 10 mL
  Administered 2022-10-11: 30 mL
  Administered 2022-10-11 – 2022-10-17 (×13): 10 mL
  Administered 2022-10-18: 20 mL
  Administered 2022-10-18 – 2022-10-19 (×2): 10 mL

## 2022-09-27 MED ORDER — DILTIAZEM HCL-DEXTROSE 125-5 MG/125ML-% IV SOLN (PREMIX)
5.0000 mg/h | INTRAVENOUS | Status: AC
  Administered 2022-09-27: 15 mg/h via INTRAVENOUS
  Administered 2022-09-27: 5 mg/h via INTRAVENOUS
  Administered 2022-09-28: 15 mg/h via INTRAVENOUS
  Filled 2022-09-27 (×4): qty 125

## 2022-09-27 NOTE — Consult Note (Signed)
ANTICOAGULATION CONSULT NOTE - Initial Consult  Pharmacy Consult for Heparin Infusion - NO BOLUS Indication: atrial fibrillation  No Known Allergies  Patient Measurements: Height: '6\' 2"'$  (188 cm) Weight: 118 kg (260 lb 2.3 oz) IBW/kg (Calculated) : 82.2 Heparin Dosing Weight: 107.3  Vital Signs: Temp: 98 F (36.7 C) (10/22 1300) BP: 123/60 (10/22 1800) Pulse Rate: 134 (10/22 1830)  Labs: Recent Labs    09/26/22 0412 09/26/22 2311 09/27/22 0332 09/27/22 1222 09/27/22 2028  HGB 8.8* 9.1* 9.3*  --   --   HCT 28.4* 30.0* 31.1*  --   --   PLT 198 193 208  --   --   APTT  --   --   --  44*  --   LABPROT  --   --   --  22.1*  --   INR  --   --   --  2.0*  --   HEPARINUNFRC  --   --   --  <0.10* <0.10*  CREATININE 1.66* 1.45* 1.45*  --   --     Estimated Creatinine Clearance: 68.4 mL/min (A) (by C-G formula based on SCr of 1.45 mg/dL (H)).   Medical History: Past Medical History:  Diagnosis Date   Adenocarcinoma of colon ([place]) 09/08/2022   Cholelithiasis    Chronic gastritis    Colon polyps    Coronary artery calcification seen on CT scan    Diastolic dysfunction    a.) TTE 04/09/2022: EF >55%, triv TR, G1DD   Diverticulosis    Hypertension    Long term current use of anticoagulant    a.) apixaban   Nephrolithiasis    Paroxysmal A-fib ([place])    a.) CHA2DS2VASc = 3 (age, HTN, T2DM);  b.) s/p DCCV 07/09/2016 (100 J x1), 12/22/2018 (120 J x1), 08/18/2021 (120 J x1), 03/11/2022 (120 J x1), 03/31/2022 (120 J x1); c.) rate/rhythm maintained on oral flecanide + metoprolol succinate; chronically anticoagulated with apixaban   T2DM (type 2 diabetes mellitus) (HCC)     Medications:  Scheduled:   acetaminophen  1,000 mg Oral Q6H   Chlorhexidine Gluconate Cloth  6 each Topical Daily   insulin aspart  0-15 Units Subcutaneous TID WC   insulin aspart  0-5 Units Subcutaneous QHS   insulin aspart  4 Units Subcutaneous TID WC   pantoprazole (PROTONIX) IV  40 mg Intravenous QHS    sodium chloride flush  10-40 mL Intracatheter Q12H   Infusions:   amiodarone 30 mg/hr (09/27/22 2000)   diltiazem (CARDIZEM) infusion 15 mg/hr (09/27/22 1955)   heparin 1,550 Units/hr (09/27/22 1337)   lactated ringers 100 mL/hr at 09/27/22 2011   magnesium sulfate bolus IVPB     piperacillin-tazobactam (ZOSYN)  IV 3.375 g (09/27/22 1527)   PRN: diphenhydrAMINE **OR** diphenhydrAMINE, morphine injection, ondansetron **OR** ondansetron (ZOFRAN) IV, mouth rinse, oxyCODONE, prochlorperazine **OR** prochlorperazine, sodium chloride flush  Assessment: Nicolas Zuniga is a 66 y.o. male presenting for laparoscopic R colectomy and cholecystectomy. PMH significant for recently diagnosed adenocarcinoma of the colon, DM, HTN, Afib on metoprolol, flecainide, and apixaban. Patient was on [facility] PTA per chart review, last dose of apixaban PTA reported to be on 10/16. Last enoxaparin dose 10/20 AM. Pharmacy has been consulted to initiate and manage heparin infusion.   Baseline Labs: aPTT 44, PT 22.1, INR 2.0, Hgb 9.3, Hct 31.1, Plt 208   Goal of Therapy:  Heparin level 0.3-0.7 units/ml Monitor platelets by anticoagulation protocol: Yes   Plan:  No bolus per MD.  Increase heparin infusion to 1950 units/hr Check HL level in 6 hours and daily while on heparin Check CBC daily while on heparin  Thank you for allowing pharmacy to be a part of this patient's care.  [place] Pharmacist 09/27/2022 9:42 PM

## 2022-09-27 NOTE — Consult Note (Signed)
Nicolas Zuniga is a 66 y.o. male  [id number]  Primary Cardiologist: Serafina Royals, MD Reason for Consultation: atrial fibrillation with RVR  HPI: Patient is a 66 year old male admitted to the hospital on 09/24/22 for right colectomy for adenocarcinoma and laparoscopic cholecystectomy. Patient with a past medical history significant for type II diabetes, hypertension, paroxysmal atrial fibrillation s/p multiple cardioversions, and hyperlipidemia. Patient's metoprolol and flecainide were held post-op due to hypotension.  Late last night patient got of bed to use the restroom, patient became very short of breath. Rapid response called for vtach on monitor with HR 141 bpm, low b/p. Amiodarone bolus given, amiodarone drip started. Patient transferred to stepdown.   Review of Systems: denies chest pain. Shortness of breath due to rapid heart rate   Past Medical History:  Diagnosis Date   Adenocarcinoma of colon ([place]) 09/08/2022   Cholelithiasis    Chronic gastritis    Colon polyps    Coronary artery calcification seen on CT scan    Diastolic dysfunction    a.) TTE 04/09/2022: EF >55%, triv TR, G1DD   Diverticulosis    Hypertension    Long term current use of anticoagulant    a.) apixaban   Nephrolithiasis    Paroxysmal A-fib ([place])    a.) CHA2DS2VASc = 3 (age, HTN, T2DM);  b.) s/p DCCV 07/09/2016 (100 J x1), 12/22/2018 (120 J x1), 08/18/2021 (120 J x1), 03/11/2022 (120 J x1), 03/31/2022 (120 J x1); c.) rate/rhythm maintained on oral flecanide + metoprolol succinate; chronically anticoagulated with apixaban   T2DM (type 2 diabetes mellitus) ([place])     Medications Prior to Admission  Medication Sig Dispense Refill   bisacodyl (DULCOLAX) 5 MG EC tablet Take all 4 tablets at 8 am the morning prior to your surgery. 4 tablet 0   Cholecalciferol (VITAMIN D) 50 MCG (2000 UT) CAPS Take 2,000 Units by mouth daily.     flecainide (TAMBOCOR) 100 MG tablet Take 100 mg by mouth 2 (two) times daily.      glipiZIDE (GLUCOTROL XL) 10 MG 24 hr tablet Take 10 mg by mouth 2 (two) times daily.      metFORMIN (GLUCOPHAGE-XR) 500 MG 24 hr tablet Take 500 mg by mouth daily with breakfast.     metoprolol (LOPRESSOR) 100 MG tablet Take 100 mg by mouth 2 (two) times daily.     metroNIDAZOLE (FLAGYL) 500 MG tablet Take 2 tablets at 8AM, take 2 tablets at [facility], and take 2 tablets at 8PM the day prior to your surgery 6 tablet 0   Multiple Vitamins-Minerals (MULTIVITAMIN WITH MINERALS) tablet Take 1 tablet by mouth daily with breakfast.      neomycin (MYCIFRADIN) 500 MG tablet Take 2 tablet at 8am, take 2 tablets at 2pm, and take 2 tablets at 8pm the day prior to your surgery 6 tablet 0   Omega-3 Fatty Acids (FISH OIL) 1000 MG CAPS Take 1,000 mg by mouth daily.     OZEMPIC, 1 MG/DOSE, 4 MG/3ML SOPN Inject 1 mg into the skin every Wednesday. Patient stopped taking these medications     polyethylene glycol powder (MIRALAX) 17 GM/SCOOP powder Mix full container in 64 ounces of Gatorade or other clear liquid. NO Red 238 g 0   Turmeric 400 MG CAPS Take 400 mg by mouth daily.     vitamin B-12 (CYANOCOBALAMIN) 100 MCG tablet Take 500 mcg by mouth daily.     Vitamin E 400 units TABS Take 400 Units by mouth daily.  apixaban (ELIQUIS) 5 MG TABS tablet Take 5 mg by mouth 2 (two) times daily.     Coenzyme Q10 (COQ10) 100 MG CAPS Take 100 mg by mouth daily.        acetaminophen  1,000 mg Oral Q6H   Chlorhexidine Gluconate Cloth  6 each Topical Daily   insulin aspart  0-15 Units Subcutaneous TID WC   insulin aspart  0-5 Units Subcutaneous QHS   insulin aspart  4 Units Subcutaneous TID WC   iohexol  500 mL Oral Q1H   magnesium oxide  400 mg Oral BID   pantoprazole (PROTONIX) IV  40 mg Intravenous QHS    Infusions:  amiodarone 60 mg/hr (09/27/22 0843)   Followed by   amiodarone     diltiazem (CARDIZEM) infusion 10 mg/hr (09/27/22 1212)    No Known Allergies  Social History   Socioeconomic History    Marital status: Married    Spouse name: Not on file   Number of children: Not on file   Years of education: Not on file   Highest education level: Not on file  Occupational History   Not on file  Tobacco Use   Smoking status: Never    Passive exposure: Never   Smokeless tobacco: Never  Vaping Use   Vaping Use: Never used  Substance and Sexual Activity   Alcohol use: No   Drug use: No   Sexual activity: Not on file  Other Topics Concern   Not on file  Social History Narrative   Lives with wife, [name], with on pet, cat.   Social Determinants of Health   Financial Resource Strain: Not on file  Food Insecurity: No Food Insecurity (09/24/2022)   Hunger Vital Sign    Worried About Running Out of Food in the Last Year: Never true    Ran Out of Food in the Last Year: Never true  Transportation Needs: No Transportation Needs (09/24/2022)   PRAPARE - Hydrologist (Medical): No    Lack of Transportation (Non-Medical): No  Physical Activity: Not on file  Stress: Not on file  Social Connections: Not on file  Intimate Partner Violence: Not At Risk (09/24/2022)   Humiliation, Afraid, Rape, and Kick questionnaire    Fear of Current or Ex-Partner: No    Emotionally Abused: No    Physically Abused: No    Sexually Abused: No    Family History  Problem Relation Age of Onset   Varicose Veins Mother    Heart disease Mother    Diabetes Mother    COPD Father     PHYSICAL EXAM: Vitals:   09/27/22 1145 09/27/22 1200  BP: 125/73 103/68  Pulse: (!) 142 (!) 148  Resp: (!) 23 (!) 30  Temp:    SpO2: 100% 100%     Intake/Output Summary (Last 24 hours) at 09/27/2022 1213 Last data filed at 09/27/2022 0600 Gross per 24 hour  Intake 1660.12 ml  Output 300 ml  Net 1360.12 ml    General:  Well appearing. No respiratory difficulty HEENT: normal Neck: supple. no JVD. Carotids 2+ bilat; no bruits. No lymphadenopathy or thryomegaly appreciated. Cor: PMI  nondisplaced. Regular rate & rhythm. No rubs, gallops or murmurs. Lungs: clear Abdomen: soft, nontender, nondistended. No hepatosplenomegaly. No bruits or masses. Good bowel sounds. Extremities: no cyanosis, clubbing, rash, edema Neuro: alert & oriented x 3, cranial nerves grossly intact. moves all 4 extremities w/o difficulty. Affect pleasant.  ECG: NSR, HR 60 bpm  Results for orders placed or performed during the hospital encounter of 09/24/22 (from the past 24 hour(s))  Glucose, capillary     Status: Abnormal   Collection Time: 09/26/22  4:29 PM  Result Value Ref Range   Glucose-Capillary 104 (H) 70 - 99 mg/dL   Comment 1 Notify RN    Comment 2 Document in Chart   Glucose, capillary     Status: Abnormal   Collection Time: 09/26/22  8:41 PM  Result Value Ref Range   Glucose-Capillary 121 (H) 70 - 99 mg/dL  Blood gas, venous     Status: Abnormal (Preliminary result)   Collection Time: 09/26/22 11:11 PM  Result Value Ref Range   pH, Ven 7.33 7.25 - 7.43   pCO2, Ven 29 (L) 44 - 60 mmHg   pO2, Ven PENDING 32 - 45 mmHg   Bicarbonate 15.3 (L) 20.0 - 28.0 mmol/L   Acid-base deficit 9.2 (H) 0.0 - 2.0 mmol/L   O2 Saturation PENDING %   Patient temperature 37.0    Collection site VENOUS    Drawn by OUTSIDE SERVICE OPLAB   Comprehensive metabolic panel     Status: Abnormal   Collection Time: 09/26/22 11:11 PM  Result Value Ref Range   Sodium 132 (L) 135 - 145 mmol/L   Potassium 4.7 3.5 - 5.1 mmol/L   Chloride 104 98 - 111 mmol/L   CO2 15 (L) 22 - 32 mmol/L   Glucose, Bld 103 (H) 70 - 99 mg/dL   BUN 23 8 - 23 mg/dL   Creatinine, Ser 1.45 (H) 0.61 - 1.24 mg/dL   Calcium 8.9 8.9 - 10.3 mg/dL   Total Protein 6.0 (L) 6.5 - 8.1 g/dL   Albumin 2.9 (L) 3.5 - 5.0 g/dL   AST 32 15 - 41 U/L   ALT 14 0 - 44 U/L   Alkaline Phosphatase 40 38 - 126 U/L   Total Bilirubin 0.9 0.3 - 1.2 mg/dL   GFR, Estimated 53 (L) >60 mL/min   Anion gap 13 5 - 15  CBC     Status: Abnormal   Collection Time:  09/26/22 11:11 PM  Result Value Ref Range   WBC 9.6 4.0 - 10.5 K/uL   RBC 3.79 (L) 4.22 - 5.81 MIL/uL   Hemoglobin 9.1 (L) 13.0 - 17.0 g/dL   HCT 30.0 (L) 39.0 - 52.0 %   MCV 79.2 (L) 80.0 - 100.0 fL   MCH 24.0 (L) 26.0 - 34.0 pg   MCHC 30.3 30.0 - 36.0 g/dL   RDW 14.7 11.5 - 15.5 %   Platelets 193 150 - 400 K/uL   nRBC 0.0 0.0 - 0.2 %  Glucose, capillary     Status: None   Collection Time: 09/27/22 12:51 AM  Result Value Ref Range   Glucose-Capillary 91 70 - 99 mg/dL  MRSA Next Gen by PCR, Nasal     Status: None   Collection Time: 09/27/22  1:53 AM   Specimen: Nasal Mucosa; Nasal Swab  Result Value Ref Range   MRSA by PCR Next Gen NOT DETECTED NOT DETECTED  Glucose, capillary     Status: None   Collection Time: 09/27/22  3:00 AM  Result Value Ref Range   Glucose-Capillary 74 70 - 99 mg/dL  CBC     Status: Abnormal   Collection Time: 09/27/22  3:32 AM  Result Value Ref Range   WBC 8.5 4.0 - 10.5 K/uL   RBC 3.92 (L) 4.22 - 5.81 MIL/uL  Hemoglobin 9.3 (L) 13.0 - 17.0 g/dL   HCT 31.1 (L) 39.0 - 52.0 %   MCV 79.3 (L) 80.0 - 100.0 fL   MCH 23.7 (L) 26.0 - 34.0 pg   MCHC 29.9 (L) 30.0 - 36.0 g/dL   RDW 14.9 11.5 - 15.5 %   Platelets 208 150 - 400 K/uL   nRBC 0.0 0.0 - 0.2 %  Basic metabolic panel     Status: Abnormal   Collection Time: 09/27/22  3:32 AM  Result Value Ref Range   Sodium 133 (L) 135 - 145 mmol/L   Potassium 4.3 3.5 - 5.1 mmol/L   Chloride 103 98 - 111 mmol/L   CO2 18 (L) 22 - 32 mmol/L   Glucose, Bld 84 70 - 99 mg/dL   BUN 22 8 - 23 mg/dL   Creatinine, Ser 1.45 (H) 0.61 - 1.24 mg/dL   Calcium 8.9 8.9 - 10.3 mg/dL   GFR, Estimated 53 (L) >60 mL/min   Anion gap 12 5 - 15  TSH     Status: None   Collection Time: 09/27/22  3:32 AM  Result Value Ref Range   TSH 3.741 0.350 - 4.500 uIU/mL  T4, free     Status: None   Collection Time: 09/27/22  3:32 AM  Result Value Ref Range   Free T4 0.94 0.61 - 1.12 ng/dL  Magnesium     Status: Abnormal   Collection  Time: 09/27/22  3:32 AM  Result Value Ref Range   Magnesium 1.5 (L) 1.7 - 2.4 mg/dL  Glucose, capillary     Status: Abnormal   Collection Time: 09/27/22  7:23 AM  Result Value Ref Range   Glucose-Capillary 64 (L) 70 - 99 mg/dL  Glucose, capillary     Status: Abnormal   Collection Time: 09/27/22  7:25 AM  Result Value Ref Range   Glucose-Capillary 66 (L) 70 - 99 mg/dL  Glucose, capillary     Status: Abnormal   Collection Time: 09/27/22  7:45 AM  Result Value Ref Range   Glucose-Capillary 61 (L) 70 - 99 mg/dL  Glucose, capillary     Status: None   Collection Time: 09/27/22  8:16 AM  Result Value Ref Range   Glucose-Capillary 74 70 - 99 mg/dL  Glucose, capillary     Status: Abnormal   Collection Time: 09/27/22 11:23 AM  Result Value Ref Range   Glucose-Capillary 105 (H) 70 - 99 mg/dL   DG ABD ACUTE 2+V W 1V CHEST  Result Date: 09/26/2022 CLINICAL DATA:  Ileus following open right colectomy on 09/24/2022 EXAM: DG ABDOMEN ACUTE WITH 1 VIEW CHEST COMPARISON:  09/17/2022 FINDINGS: Gaseous distension of the stomach. Air is seen within the colon. No dilated loops of small bowel. Small-moderate volume pneumoperitoneum. Numerous surgical clips within the right hemiabdomen. Midline abdominal skin staples. Heart size within normal limits. Low lung volumes. Trace left pleural effusion with left basilar atelectasis. No pneumothorax. IMPRESSION: 1. Small-moderate volume pneumoperitoneum, likely related to recent surgery. 2. Gaseous distension of the stomach. No dilated loops of small bowel to suggest obstruction. Electronically Signed   By: Davina Poke D.O.   On: 09/26/2022 10:57     ASSESSMENT AND PLAN: Patient laying in bed. Denies chest pain. Shortness of breath with rapid heart rate. Heart rate continues to be in the 140's, atrial fibrillation. B/p improved. Cardizem drip started and bolus given. Will continue to follow.   Engineer, drilling FNP-C

## 2022-09-27 NOTE — Progress Notes (Signed)
Aldona Bar NP about patient's heart rate, new orders for amiodarone bolus to be given.

## 2022-09-27 NOTE — Plan of Care (Signed)
  Problem: Pain Managment: Goal: General experience of comfort will improve Outcome: Progressing   Problem: Education: Goal: Knowledge of disease or condition will improve Outcome: Progressing Goal: Understanding of medication regimen will improve Outcome: Progressing  Patient transferred from 230 due to rhythm change and hypotension. A rapid response was initiated on the unit.  Patient is in atrial fib. Patient has been given intravenous metoprolol and amiodarone. Patient has not complaints of pain but is short of breath at rest. Received report via bedside from [facility].

## 2022-09-27 NOTE — Progress Notes (Signed)
Events noted from last night. Notified by secure chat around 8 am A fib w RVR  He has only some soreness  Wbc normal , afebrile Tolerating diet no nausea   CT pers reviewed and d/w radiologist There is contrast in the distal colon, some attenuation w small fludi around the anastomosis, difficult to exclude leak. Certainly not massive contrast extravasation     PE NAD Abd: soft, mildly distended, appropriate incisional tenderness , no peritonitis   A/P A fib w RVR ? Small leak, certainly no contrast extravasation and seems contained On exam he is not peritonitic. I do think this is a sublinical leak and I would hate to explore him when his cardiac status is not optimal.  I would like to start antibiotics and re-scan him in 24-48 hrs   We will keep npo except meds , if he worsens may require surgical intervention I had an extensive discussion with the pt and his wife, I did explain that his situation is not clear cut. I did offer the two option of re-exploration vs repeating CT in am. I am available to take him back if he deteriorates. They understand the situation in detail and they want to hold of emergent surgical interventions at this time. We will start empiric antibiotics

## 2022-09-27 NOTE — Progress Notes (Addendum)
PROGRESS NOTE - follow up on consult    Nicolas Zuniga   ZOX:[id number] DOB: 08/31/1956  DOA: 09/24/2022 Date of Service: 09/27/22 PCP: Hortencia Pilar, MD  Requesting physician: Dr. Dahlia Byes Date of initial consultation: 09/25/22  Reason for consultation: Hypotension     Brief Narrative / Hospital Course:  Nicolas Zuniga is a 66 y.o. male with hx of recently diagnosed adenocarcinoma of the colon who is currently admitted status post right colectomy, diabetes, hypertension, and A-fib status post multiple ablations currently on metoprolol and flecainide.  09/24/2022 - Underwent a laparoscopic right colectomy that was converted to open right colectomy along with a cholecystectomy - no significant complications and EBL was minimal at 80 cc.   10/20: Seen early morning by Dr. Dahlia Byes (general surgery) and was doing well.  Starting around 6 PM he began to have hypotension 70/41 and the hospitalist service was consulted. Pt was given total of 25 g of albumin, 2 L of normal saline, and started on glucagon gtt concern for beta blocker toxicity. His metoprolol and flecainide were held. BP improved. Mild AKI on labs Cr 1.93 down form admission 1.23, likely hypovolemia. D/c glucagon. Strict I&O. Continue hold beta blocker and flecainide, may reduce dose metoprolol on discharge.  10/21: BP soft but improved 115/53, 117/64. Cr improved 1.66. Hgb stable.  Rapid response overnight, A-fib RVR.  Transferred to stepdown, amiodarone infusion, minimal response, diltiazem infusion.  Dr. Humphrey Rolls, cardiology, aware. Dr Dahlia Byes ordering CT chest/abdomen/pelvis, starting heparin drip. CT no PE, (+) Small bowel leak, certainly no contrast extravasation and seems contained --> plan repeat CT, hold off on surgical intervention unless emergent, starting abx.       ASSESSMENT & PLAN:   Principal Problem:   S/P right colectomy Active Problems:   Diabetes ([place])   Essential hypertension   Paroxysmal A-fib ([facility])   S/P partial  resection of colon   Hypotension   Hyponatremia   AKI (acute kidney injury) ([place])   Hypotension Likely hypovolemia I&O given lack of significant underlying cardiac disease okay for more fluid resuscitation if necessary Continue holding metoprolol and flecainide for the time being.   Would not be unreasonable to reduce his dose of metoprolol or continue to hold on discharge   A-fib RVR on Chronic paroxysmal A-fib ([facility]) Postop hypotension, we were holding metoprolol and flecainide for time being Transferred to stepdown 09/27/2022, amiodarone bolus/drip, minimal response, started diltiazem bolus/drip once BP improved. Cardiology aware --> no new orders TSH checked --> normal  Diabetes ([facility]) insulin sliding scale correction factor  Hyponatremia Likely hypovolemic trend BMP --> improved this morning   AKI (acute kidney injury) ([place]) Likely prerenal in setting of surgery as well as patient reported decreased p.o. intake in the last several days trend BMP status post fluid resuscitation --> not quite back to baseline but Cr is improved on 10/21 and stable on 10/22  Colon cancer, cholelithiasis 10/19: Attempted hand-assisted laparoscopic right colectomy, converted to open right colectomy with ileocolic anastomosis, open cholecystectomy. General surgery following CT abd-pelv done  today given hemodynamics --. ? Small leak, certainly no contrast extravasation and seems contained  Per Dr Dahlia Byes: "On exam he is not peritonitic. I do think this is a sublinical leak and I would hate to explore him when his cardiac status is not optimal. I would like to start antibiotics and re-scan him in 24-48 hrs" Dr Dahlia Byes spoke w/ patient and wife and they are amenable to this plan   SOB / tachypnea without hypoxia  CTA chest no PE   Prostate nodule incidental finding on   Follow outpatient           Subjective:  Patient seen and examined on stepdown unit, reports shortness of breath and  palpitations very similar to previous A-fib episodes, no chest pressure, lightheadedness, headache, vision change.  Abdominal pain postoperative about the same as yesterday, no worsening.       Objective:  Vitals:   09/27/22 1400 09/27/22 1415 09/27/22 1430 09/27/22 1445  BP: 100/61 110/85 112/66 96/74  Pulse: (!) 144 (!) 130 (!) 143 (!) 144  Resp: (!) 32 (!) 23 (!) 36 (!) 37  Temp:      TempSrc:      SpO2: 98% 99% 98% 98%  Weight:      Height:        Intake/Output Summary (Last 24 hours) at 09/27/2022 1505 Last data filed at 09/27/2022 1400 Gross per 24 hour  Intake 1660.12 ml  Output 301 ml  Net 1359.12 ml   Filed Weights   09/24/22 1038 09/27/22 0148  Weight: 124.7 kg 118 kg    Examination:  Constitutional:  VS as above General Appearance: alert, well-developed, well-nourished, NAD Respiratory: Normal respiratory effort No wheeze No rhonchi No rales Cardiovascular: Tachycardic No murmur appreciated No rub/gallop appreciated No lower extremity edema Musculoskeletal:  Symmetrical movement in all extremities Neurological: No cranial nerve deficit on limited exam Alert Psychiatric: Normal judgment/insight Anxious mood and affect       Scheduled Medications:   acetaminophen  1,000 mg Oral Q6H   Chlorhexidine Gluconate Cloth  6 each Topical Daily   insulin aspart  0-15 Units Subcutaneous TID WC   insulin aspart  0-5 Units Subcutaneous QHS   insulin aspart  4 Units Subcutaneous TID WC   magnesium oxide  400 mg Oral BID   pantoprazole (PROTONIX) IV  40 mg Intravenous QHS    Continuous Infusions:  amiodarone     diltiazem (CARDIZEM) infusion 15 mg/hr (09/27/22 1245)   heparin 1,550 Units/hr (09/27/22 1337)   piperacillin-tazobactam (ZOSYN)  IV      PRN Medications:  diphenhydrAMINE **OR** diphenhydrAMINE, morphine injection, ondansetron **OR** ondansetron (ZOFRAN) IV, mouth rinse, oxyCODONE, prochlorperazine **OR**  prochlorperazine  Antimicrobials:  Anti-infectives (From admission, onward)    Start     Dose/Rate Route Frequency Ordered Stop   09/27/22 1400  piperacillin-tazobactam (ZOSYN) IVPB 3.375 g        3.375 g 12.5 mL/hr over 240 Minutes Intravenous Every 8 hours 09/27/22 1214     09/24/22 2200  cefoTEtan (CEFOTAN) 2 g in sodium chloride 0.9 % 100 mL IVPB        2 g 200 mL/hr over 30 Minutes Intravenous Every 12 hours 09/24/22 1717 09/25/22 0954   09/24/22 1530  metroNIDAZOLE (FLAGYL) IVPB 500 mg        500 mg 100 mL/hr over 60 Minutes Intravenous  Once 09/24/22 1515 09/24/22 1525   09/24/22 0600  ceFAZolin (ANCEF) IVPB 3g/100 mL premix  Status:  Discontinued        3 g 200 mL/hr over 30 Minutes Intravenous On call to O.R. 09/24/22 0112 09/24/22 1717       Data Reviewed: I have personally reviewed following labs and imaging studies  CBC: Recent Labs  Lab 09/25/22 0613 09/25/22 2138 09/26/22 0412 09/26/22 2311 09/27/22 0332  WBC 16.9* 16.0* 15.3* 9.6 8.5  HGB 9.2* 8.6* 8.8* 9.1* 9.3*  HCT 29.8* 28.0* 28.4* 30.0* 31.1*  MCV 77.2* 78.2* 78.0* 79.2*  79.3*  PLT 225 181 198 193 400   Basic Metabolic Panel: Recent Labs  Lab 09/25/22 0613 09/25/22 2138 09/26/22 0412 09/26/22 2311 09/27/22 0332  NA 131* 130* 132* 132* 133*  K 4.3 3.7 4.3 4.7 4.3  CL 100 102 105 104 103  CO2 17* 17* 17* 15* 18*  GLUCOSE 232* 161* 204* 103* 84  BUN 22 22 25* 23 22  CREATININE 1.23 1.93* 1.66* 1.45* 1.45*  CALCIUM 8.4* 8.0* 8.3* 8.9 8.9  MG 1.7  --   --   --  1.5*   GFR: Estimated Creatinine Clearance: 68.4 mL/min (A) (by C-G formula based on SCr of 1.45 mg/dL (H)). Liver Function Tests: Recent Labs  Lab 09/26/22 0412 09/26/22 2311  AST 29 32  ALT 16 14  ALKPHOS 43 40  BILITOT 0.5 0.9  PROT 5.4* 6.0*  ALBUMIN 2.7* 2.9*   No results for input(s): "LIPASE", "AMYLASE" in the last 168 hours. No results for input(s): "AMMONIA" in the last 168 hours. Coagulation Profile: Recent Labs   Lab 09/27/22 1222  INR 2.0*   Cardiac Enzymes: No results for input(s): "CKTOTAL", "CKMB", "CKMBINDEX", "TROPONINI" in the last 168 hours. BNP (last 3 results) No results for input(s): "PROBNP" in the last 8760 hours. HbA1C: Recent Labs    09/24/22 1744  HGBA1C 9.8*   CBG: Recent Labs  Lab 09/27/22 0723 09/27/22 0725 09/27/22 0745 09/27/22 0816 09/27/22 1123  GLUCAP 64* 66* 61* 74 105*   Lipid Profile: No results for input(s): "CHOL", "HDL", "LDLCALC", "TRIG", "CHOLHDL", "LDLDIRECT" in the last 72 hours. Thyroid Function Tests: Recent Labs    09/27/22 0332  TSH 3.741  FREET4 0.94   Anemia Panel: No results for input(s): "VITAMINB12", "FOLATE", "FERRITIN", "TIBC", "IRON", "RETICCTPCT" in the last 72 hours. Urine analysis:    Component Value Date/Time   COLORURINE YELLOW 08/08/2021 1249   APPEARANCEUR CLEAR 08/08/2021 1249   APPEARANCEUR Cloudy 06/13/2012 1253   LABSPEC >1.030 (H) 08/08/2021 1249   LABSPEC 1.026 06/13/2012 1253   PHURINE 5.0 08/08/2021 1249   GLUCOSEU NEGATIVE 08/08/2021 1249   GLUCOSEU 50 mg/dL 06/13/2012 1253   HGBUR TRACE (A) 08/08/2021 1249   BILIRUBINUR NEGATIVE 08/08/2021 1249   BILIRUBINUR Negative 06/13/2012 1253   KETONESUR TRACE (A) 08/08/2021 1249   PROTEINUR NEGATIVE 08/08/2021 1249   NITRITE NEGATIVE 08/08/2021 1249   LEUKOCYTESUR NEGATIVE 08/08/2021 1249   LEUKOCYTESUR Negative 06/13/2012 1253   Sepsis Labs: '@LABRCNTIP'$ (procalcitonin:4,lacticidven:4)  Recent Results (from the past 240 hour(s))  MRSA Next Gen by PCR, Nasal     Status: None   Collection Time: 09/27/22  1:53 AM   Specimen: Nasal Mucosa; Nasal Swab  Result Value Ref Range Status   MRSA by PCR Next Gen NOT DETECTED NOT DETECTED Final    Comment: (NOTE) The GeneXpert MRSA Assay (FDA approved for NASAL specimens only), is one component of a comprehensive MRSA colonization surveillance program. It is not intended to diagnose MRSA infection nor to guide or  monitor treatment for MRSA infections. Test performance is not FDA approved in patients less than 75 years old. Performed at [facility], [address], [place], [place] [address]          Radiology Studies: No results found.          LOS: 3 days       Emeterio Reeve, DO [facility] 09/27/2022, 3:05 PM   Staff may message me via secure chat in [place]  but this may not receive immediate response,  please page for urgent matters!  If 7PM-7AM, please contact night-coverage [email or web address]  Dictation software was used to generate the above note. Typos may occur and escape review, as with typed/written notes. Please contact Dr Sheppard Coil directly for clarity if needed.

## 2022-09-27 NOTE — Progress Notes (Signed)
Called pt wife and updated her about her husband 's transfer to ICU room 15.

## 2022-09-27 NOTE — Progress Notes (Signed)
NUTRITION NOTE RD working remotely.  Page received from the Director of [employer] who shares that yesterday patient was ordered a bariatric full liquid diet. Today diet was downgraded to clear liquids without the bariatric designation.   RD reviewed medical history and notes from 10/21 and note from 10/22. Unable to locate any documentation that indicates need for bariatric designation or smaller volume of liquids on trays.   Communicated these findings to Stage manager [employer].  If additional nutrition-related needs arise, please reach back out to the RD.    Jarome Matin, MS, RD, LDN, CNSC Clinical Dietitian PRN/Relief staff On-call/weekend pager # available in [facility]

## 2022-09-27 NOTE — Progress Notes (Signed)
Rapid Response Event Note   Reason for Call : High heart rate and low blood pressure    Initial Focused Assessment:  Awake and alert male lying in bed, heart rate in 140's states he has a history of afib and has been off his meds for surgery. Tonight got up to use bedside commode, had episode of Vtach and became short of breath. Never lost consciousness able to speak in complete sentences. Hypotensive as well.     Interventions:  Inserted 20 G IV in R hand, ekg obtained, showing Afib, spoke with Sharion Settler NP via secure chat orders placed for fluid bolus and a one time dose of Metoprolol, then when blood pressure did not respond to fluids, transferred patient to SD.  Plan of Care:  Transfer to SD and start Amiodarone per order   Event Summary:   MD Notified: Sharion Settler NP  Call [place]  Lance Bosch, RN

## 2022-09-27 NOTE — Progress Notes (Addendum)
0700 Report received. Patient alert and oriented. Respirations are 30+ and shallow. Heart rate 150's to 160's.A fib RVR. Oxygen saturations 98% on room air. CBG 60's,given juice. Repeat CBG remains in the 60's. Juice repeated. After 15 minutes CBG 74. At the same time call back from [place] gave orders for po Metoprolol 12.5 po for heart rate. Dr.Nam in to assess patient. Dr Sheppard Coil (hospital ist for today,) joined the conversation. PO metoprolol not given by nurse and Dr. Sheppard Coil ordered an Amiodarone drip. Patient remains alert. 0845 After bolus and drip started. Blood pressure 117/73 and heart rate 117. Patient remains awake and alert.1100 Remains in rapid A.Fib. Amiodorone at '60mg'$ .1130 Complains of SOB. Pulse oximeter says 100% on room air. 2L nasal cannula placed on patient. All physicians notified of continued rapid A Fib. 1145  Started on Cardizem drip per orders. Heart rate still in 140 s. 1300 Cardizem drip increased as ordered to max of 15 mg/hour. Patient refused 1200 Tylenol. 1315 Down to CT. 1345 Back to room. Had large green liquid BM. 55 Dr.Pabon in to talk with patient concerning CT. Rested most of the afternoon after given prn for pain. 1800 PICC line placement in progress.1900 Remains in  A.Fib with respiratory rate in the 30s

## 2022-09-27 NOTE — Progress Notes (Addendum)
       CROSS COVER NOTE  NAME: Nicolas Zuniga MRN: [id number] DOB : 02/05/1956    Date of Service   09/27/2022  HPI/Events of Note   Patietn with significant atrial fib history since 2013 which has required multiple rate controlling med trials and cardioversion.  He was taken off his current regimen of metoprolol and flecainide secondary to hypotension and bradycardia po his colectomy procedure.  This evening he developed a fib with RVR .  Rate minimally responded to initial IV dose of metoprolol.  Labs revewal bicarb deficit. IV fluids changed to bicarb drip. Primary nurse got patient up to bedside commode. Heart rate increased, Charge nurse thought she seen Vtach on monitor (strip in chart does not endorse) and called rapid response  Assessment and  Interventions   Assessment: Alert and oriented Denies chest pain or shortness of breath Plan: Transfer to stepdown 1 amp bicarb followed by IVF previously ordered Amiodarone bolus followed by drip.  On eliquis prior to surgery. Will need clearance from surgeon for anticoagulation restart Check TSH  andT4-previously on amiodarone and had energy problem from heart rate being too low       0545 rebolus amio required for rvr 140s, consult cardiology message sent to Dr Humphrey Rolls, patient usually seen Dr Nehemiah Massed outpatient Cta chest has PE risk, utility of ddimer post surgery not beneficial  Kathlene Cote NP [facility]

## 2022-09-27 NOTE — Progress Notes (Signed)
Ch responded to RRT. Pt is unavailable. [place] offered compassionate presence. Please contact [place] if requested by pt.

## 2022-09-27 NOTE — Consult Note (Signed)
ANTICOAGULATION CONSULT NOTE - Initial Consult  Pharmacy Consult for Heparin Infusion Indication: atrial fibrillation  No Known Allergies  Patient Measurements: Height: '6\' 2"'$  (188 cm) Weight: 118 kg (260 lb 2.3 oz) IBW/kg (Calculated) : 82.2 Heparin Dosing Weight: 107.3  Vital Signs: Temp: 97.8 F (36.6 C) (10/22 0800) Temp Source: Oral (10/22 0104) BP: 116/62 (10/22 1100) Pulse Rate: 150 (10/22 1100)  Labs: Recent Labs    09/26/22 0412 09/26/22 2311 09/27/22 0332  HGB 8.8* 9.1* 9.3*  HCT 28.4* 30.0* 31.1*  PLT 198 193 208  CREATININE 1.66* 1.45* 1.45*    Estimated Creatinine Clearance: 68.4 mL/min (A) (by C-G formula based on SCr of 1.45 mg/dL (H)).   Medical History: Past Medical History:  Diagnosis Date   Adenocarcinoma of colon ([place]) 09/08/2022   Cholelithiasis    Chronic gastritis    Colon polyps    Coronary artery calcification seen on CT scan    Diastolic dysfunction    a.) TTE 04/09/2022: EF >55%, triv TR, G1DD   Diverticulosis    Hypertension    Long term current use of anticoagulant    a.) apixaban   Nephrolithiasis    Paroxysmal A-fib ([place])    a.) CHA2DS2VASc = 3 (age, HTN, T2DM);  b.) s/p DCCV 07/09/2016 (100 J x1), 12/22/2018 (120 J x1), 08/18/2021 (120 J x1), 03/11/2022 (120 J x1), 03/31/2022 (120 J x1); c.) rate/rhythm maintained on oral flecanide + metoprolol succinate; chronically anticoagulated with apixaban   T2DM (type 2 diabetes mellitus) (HCC)     Medications:  Scheduled:   acetaminophen  1,000 mg Oral Q6H   Chlorhexidine Gluconate Cloth  6 each Topical Daily   diltiazem  10 mg Intravenous Once   insulin aspart  0-15 Units Subcutaneous TID WC   insulin aspart  0-5 Units Subcutaneous QHS   insulin aspart  4 Units Subcutaneous TID WC   iohexol  500 mL Oral Q1H   magnesium oxide  400 mg Oral BID   pantoprazole (PROTONIX) IV  40 mg Intravenous QHS   Infusions:   amiodarone 60 mg/hr (09/27/22 0843)   Followed by   amiodarone      diltiazem (CARDIZEM) infusion     PRN: diphenhydrAMINE **OR** diphenhydrAMINE, morphine injection, ondansetron **OR** ondansetron (ZOFRAN) IV, mouth rinse, oxyCODONE, prochlorperazine **OR** prochlorperazine  Assessment: Nicolas Zuniga is a 66 y.o. male presenting for laparoscopic R colectomy and cholecystectomy. PMH significant for recently diagnosed adenocarcinoma of the colon, DM, HTN, Afib on metoprolol, flecainide, and apixaban. Patient was on [facility] PTA per chart review, last dose of apixaban PTA reported to be on 10/16. Last enoxaparin dose 10/20 AM. Pharmacy has been consulted to initiate and manage heparin infusion.   Baseline Labs: aPTT 44, PT 22.1, INR 2.0, Hgb 9.3, Hct 31.1, Plt 208   Goal of Therapy:  Heparin level 0.3-0.7 units/ml Monitor platelets by anticoagulation protocol: Yes   Plan:  No bolus per MD Start heparin infusion at 1550 units/hr Check HL level in 6 hours and daily while on heparin Check CBC daily while on heparin  Thank you for allowing pharmacy to be a part of this patient's care.  Gretel Acre, PharmD PGY1 Pharmacy Resident 09/27/2022 11:58 AM

## 2022-09-27 NOTE — Progress Notes (Signed)
Peripherally Inserted Central Catheter Placement  The IV Nurse has discussed with the patient and/or persons authorized to consent for the patient, the purpose of this procedure and the potential benefits and risks involved with this procedure.  The benefits include less needle sticks, lab draws from the catheter, and the patient may be discharged home with the catheter. Risks include, but not limited to, infection, bleeding, blood clot (thrombus formation), and puncture of an artery; nerve damage and irregular heartbeat and possibility to perform a PICC exchange if needed/ordered by physician.  Alternatives to this procedure were also discussed.  Bard Power PICC patient education guide, fact sheet on infection prevention and patient information card has been provided to patient /or left at bedside.    PICC Placement Documentation  PICC Triple Lumen 09/27/22 Right Brachial 46 cm 0 cm (Active)  Indication for Insertion or Continuance of Line Vasoactive infusions;Poor Vasculature-patient has had multiple peripheral attempts or PIVs lasting less than 24 hours 09/27/22 1837  Exposed Catheter (cm) 0 cm 09/27/22 1837  Site Assessment Clean, Dry, Intact 09/27/22 1837  Lumen #1 Status Flushed;Saline locked;Blood return noted 09/27/22 1837  Lumen #2 Status Flushed;Saline locked;Blood return noted 09/27/22 1837  Lumen #3 Status Flushed;Saline locked;Blood return noted 09/27/22 1837  Dressing Type Transparent;Securing device 09/27/22 1837  Dressing Status Antimicrobial disc in place;Clean, Dry, Intact 09/27/22 1837  Safety Lock Not Applicable 19/75/88 3254  Line Care Connections checked and tightened 09/27/22 1837  Line Adjustment (NICU/IV Team Only) No 09/27/22 1837  Dressing Intervention New dressing 09/27/22 1837  Dressing Change Due 10/04/22 09/27/22 1837       Rolena Infante 09/27/2022, 6:38 PM

## 2022-09-27 NOTE — Progress Notes (Signed)
At [place] by the pt that he was having SOB, pt  just got to bed after using a bedside commode. Checked pt oxygen at 0040 and his oxygen saturation was 98% on RA. Pt HR was 141. RRT were called to the pt room because the pt converted to V-tach on the monitor. VS taken, on call provider notified for further evaluation.

## 2022-09-28 ENCOUNTER — Inpatient Hospital Stay: Payer: Medicare Other | Admitting: Certified Registered Nurse Anesthetist

## 2022-09-28 ENCOUNTER — Inpatient Hospital Stay: Payer: Medicare Other

## 2022-09-28 ENCOUNTER — Encounter
Admission: RE | Disposition: A | Discharge status: Skilled Nursing Facility | Payer: Self-pay | Source: Home / Self Care | Attending: Surgery

## 2022-09-28 ENCOUNTER — Encounter: Payer: Self-pay | Admitting: Surgery

## 2022-09-28 ENCOUNTER — Other Ambulatory Visit: Payer: Self-pay

## 2022-09-28 DIAGNOSIS — I4819 Other persistent atrial fibrillation: Secondary | ICD-10-CM | POA: Diagnosis not present

## 2022-09-28 DIAGNOSIS — N179 Acute kidney failure, unspecified: Secondary | ICD-10-CM | POA: Diagnosis not present

## 2022-09-28 DIAGNOSIS — E119 Type 2 diabetes mellitus without complications: Secondary | ICD-10-CM | POA: Diagnosis not present

## 2022-09-28 DIAGNOSIS — I9589 Other hypotension: Secondary | ICD-10-CM | POA: Diagnosis not present

## 2022-09-28 DIAGNOSIS — C189 Malignant neoplasm of colon, unspecified: Secondary | ICD-10-CM | POA: Diagnosis not present

## 2022-09-28 DIAGNOSIS — T8143XA Infection following a procedure, organ and space surgical site, initial encounter: Secondary | ICD-10-CM | POA: Diagnosis not present

## 2022-09-28 DIAGNOSIS — R6521 Severe sepsis with septic shock: Secondary | ICD-10-CM

## 2022-09-28 DIAGNOSIS — Z9049 Acquired absence of other specified parts of digestive tract: Secondary | ICD-10-CM | POA: Diagnosis not present

## 2022-09-28 DIAGNOSIS — A419 Sepsis, unspecified organism: Secondary | ICD-10-CM

## 2022-09-28 HISTORY — PX: LAPAROTOMY: SHX154

## 2022-09-28 LAB — BLOOD GAS, ARTERIAL
Acid-base deficit: 10.9 mmol/L — ABNORMAL HIGH (ref 0.0–2.0)
Acid-base deficit: 11.6 mmol/L — ABNORMAL HIGH (ref 0.0–2.0)
Acid-base deficit: 12.5 mmol/L — ABNORMAL HIGH (ref 0.0–2.0)
Bicarbonate: 18.1 mmol/L — ABNORMAL LOW (ref 20.0–28.0)
Bicarbonate: 16.4 mmol/L — ABNORMAL LOW (ref 20.0–28.0)
Bicarbonate: 13.7 mmol/L — ABNORMAL LOW (ref 20.0–28.0)
FIO2: 40 %
FIO2: 40 %
FIO2: 40 %
MECHVT: 550 mL
MECHVT: 550 mL
MECHVT: 600 mL
Mechanical Rate: 16
Mechanical Rate: 22
Mechanical Rate: 24
O2 Saturation: 91.7 %
O2 Saturation: 94.1 %
O2 Saturation: 95.7 %
PEEP: 5 cmH2O
PEEP: 5 cmH2O
Patient temperature: 37
Patient temperature: 37
Patient temperature: 37
RATE: 16 resp/min
pCO2 arterial: 52 mmHg — ABNORMAL HIGH (ref 32–48)
pCO2 arterial: 44 mmHg (ref 32–48)
pCO2 arterial: 32 mmHg (ref 32–48)
pH, Arterial: 7.15 — CL (ref 7.35–7.45)
pH, Arterial: 7.18 — CL (ref 7.35–7.45)
pH, Arterial: 7.24 — ABNORMAL LOW (ref 7.35–7.45)
pO2, Arterial: 71 mmHg — ABNORMAL LOW (ref 83–108)
pO2, Arterial: 75 mmHg — ABNORMAL LOW (ref 83–108)
pO2, Arterial: 75 mmHg — ABNORMAL LOW (ref 83–108)

## 2022-09-28 LAB — MAGNESIUM: Magnesium: 1.9 mg/dL (ref 1.7–2.4)

## 2022-09-28 LAB — CBC WITH DIFFERENTIAL/PLATELET
Abs Immature Granulocytes: 0.36 10*3/uL — ABNORMAL HIGH (ref 0.00–0.07)
Basophils Absolute: 0.1 10*3/uL (ref 0.0–0.1)
Basophils Relative: 1 %
Eosinophils Absolute: 0 10*3/uL (ref 0.0–0.5)
Eosinophils Relative: 0 %
HCT: 26 % — ABNORMAL LOW (ref 39.0–52.0)
Hemoglobin: 7.6 g/dL — ABNORMAL LOW (ref 13.0–17.0)
Immature Granulocytes: 3 %
Lymphocytes Relative: 11 %
Lymphs Abs: 1.5 10*3/uL (ref 0.7–4.0)
MCH: 23.5 pg — ABNORMAL LOW (ref 26.0–34.0)
MCHC: 29.2 g/dL — ABNORMAL LOW (ref 30.0–36.0)
MCV: 80.2 fL (ref 80.0–100.0)
Monocytes Absolute: 2.9 10*3/uL — ABNORMAL HIGH (ref 0.1–1.0)
Monocytes Relative: 20 %
Neutro Abs: 9.3 10*3/uL — ABNORMAL HIGH (ref 1.7–7.7)
Neutrophils Relative %: 65 %
Platelets: 259 10*3/uL (ref 150–400)
RBC: 3.24 MIL/uL — ABNORMAL LOW (ref 4.22–5.81)
RDW: 15.1 % (ref 11.5–15.5)
Smear Review: NORMAL
WBC: 14.2 10*3/uL — ABNORMAL HIGH (ref 4.0–10.5)
nRBC: 0.6 % — ABNORMAL HIGH (ref 0.0–0.2)

## 2022-09-28 LAB — HEPATIC FUNCTION PANEL
ALT: 10 U/L (ref 0–44)
AST: 22 U/L (ref 15–41)
Albumin: 2.2 g/dL — ABNORMAL LOW (ref 3.5–5.0)
Alkaline Phosphatase: 50 U/L (ref 38–126)
Bilirubin, Direct: 0.3 mg/dL — ABNORMAL HIGH (ref 0.0–0.2)
Indirect Bilirubin: 0.5 mg/dL (ref 0.3–0.9)
Total Bilirubin: 0.8 mg/dL (ref 0.3–1.2)
Total Protein: 4.4 g/dL — ABNORMAL LOW (ref 6.5–8.1)

## 2022-09-28 LAB — BASIC METABOLIC PANEL
Anion gap: 16 — ABNORMAL HIGH (ref 5–15)
Anion gap: 17 — ABNORMAL HIGH (ref 5–15)
Anion gap: 16 — ABNORMAL HIGH (ref 5–15)
BUN: 25 mg/dL — ABNORMAL HIGH (ref 8–23)
BUN: 26 mg/dL — ABNORMAL HIGH (ref 8–23)
BUN: 27 mg/dL — ABNORMAL HIGH (ref 8–23)
CO2: 16 mmol/L — ABNORMAL LOW (ref 22–32)
CO2: 16 mmol/L — ABNORMAL LOW (ref 22–32)
CO2: 14 mmol/L — ABNORMAL LOW (ref 22–32)
Calcium: 8.4 mg/dL — ABNORMAL LOW (ref 8.9–10.3)
Calcium: 8.2 mg/dL — ABNORMAL LOW (ref 8.9–10.3)
Calcium: 7.8 mg/dL — ABNORMAL LOW (ref 8.9–10.3)
Chloride: 96 mmol/L — ABNORMAL LOW (ref 98–111)
Chloride: 93 mmol/L — ABNORMAL LOW (ref 98–111)
Chloride: 96 mmol/L — ABNORMAL LOW (ref 98–111)
Creatinine, Ser: 1.86 mg/dL — ABNORMAL HIGH (ref 0.61–1.24)
Creatinine, Ser: 2.42 mg/dL — ABNORMAL HIGH (ref 0.61–1.24)
Creatinine, Ser: 2.71 mg/dL — ABNORMAL HIGH (ref 0.61–1.24)
GFR, Estimated: 39 mL/min — ABNORMAL LOW (ref >60)
GFR, Estimated: 29 mL/min — ABNORMAL LOW (ref >60)
GFR, Estimated: 25 mL/min — ABNORMAL LOW (ref >60)
Glucose, Bld: 204 mg/dL — ABNORMAL HIGH (ref 70–99)
Glucose, Bld: 250 mg/dL — ABNORMAL HIGH (ref 70–99)
Glucose, Bld: 268 mg/dL — ABNORMAL HIGH (ref 70–99)
Potassium: 4 mmol/L (ref 3.5–5.1)
Potassium: 3.9 mmol/L (ref 3.5–5.1)
Potassium: 4 mmol/L (ref 3.5–5.1)
Sodium: 128 mmol/L — ABNORMAL LOW (ref 135–145)
Sodium: 126 mmol/L — ABNORMAL LOW (ref 135–145)
Sodium: 126 mmol/L — ABNORMAL LOW (ref 135–145)

## 2022-09-28 LAB — HEMOGLOBIN AND HEMATOCRIT, BLOOD
HCT: 25.1 % — ABNORMAL LOW (ref 39.0–52.0)
Hemoglobin: 7.3 g/dL — ABNORMAL LOW (ref 13.0–17.0)

## 2022-09-28 LAB — CBC
HCT: 30.1 % — ABNORMAL LOW (ref 39.0–52.0)
Hemoglobin: 9.1 g/dL — ABNORMAL LOW (ref 13.0–17.0)
MCH: 23.9 pg — ABNORMAL LOW (ref 26.0–34.0)
MCHC: 30.2 g/dL (ref 30.0–36.0)
MCV: 79.2 fL — ABNORMAL LOW (ref 80.0–100.0)
Platelets: 303 10*3/uL (ref 150–400)
RBC: 3.8 MIL/uL — ABNORMAL LOW (ref 4.22–5.81)
RDW: 15 % (ref 11.5–15.5)
WBC: 10.8 10*3/uL — ABNORMAL HIGH (ref 4.0–10.5)
nRBC: 0.2 % (ref 0.0–0.2)

## 2022-09-28 LAB — TROPONIN I (HIGH SENSITIVITY)
Troponin I (High Sensitivity): 90 ng/L — ABNORMAL HIGH (ref <18)
Troponin I (High Sensitivity): 80 ng/L — ABNORMAL HIGH (ref <18)
Troponin I (High Sensitivity): 109 ng/L

## 2022-09-28 LAB — GLUCOSE, CAPILLARY
Glucose-Capillary: 79 mg/dL (ref 70–99)
Glucose-Capillary: 81 mg/dL (ref 70–99)
Glucose-Capillary: 93 mg/dL (ref 70–99)
Glucose-Capillary: 123 mg/dL — ABNORMAL HIGH (ref 70–99)
Glucose-Capillary: 158 mg/dL — ABNORMAL HIGH (ref 70–99)
Glucose-Capillary: 163 mg/dL — ABNORMAL HIGH (ref 70–99)
Glucose-Capillary: 186 mg/dL — ABNORMAL HIGH (ref 70–99)

## 2022-09-28 LAB — BLOOD GAS, VENOUS
Acid-base deficit: 11.3 mmol/L — ABNORMAL HIGH (ref 0.0–2.0)
Bicarbonate: 14.9 mmol/L — ABNORMAL LOW (ref 20.0–28.0)
O2 Saturation: 62 %
Patient temperature: 37
pCO2, Ven: 34 mmHg — ABNORMAL LOW (ref 44–60)
pH, Ven: 7.25 (ref 7.25–7.43)
pO2, Ven: 40 mmHg (ref 32–45)

## 2022-09-28 LAB — PROTIME-INR
INR: 2.1 — ABNORMAL HIGH (ref 0.8–1.2)
Prothrombin Time: 23.4 seconds — ABNORMAL HIGH (ref 11.4–15.2)

## 2022-09-28 LAB — LACTIC ACID, PLASMA
Lactic Acid, Venous: 5.7 mmol/L (ref 0.5–1.9)
Lactic Acid, Venous: 4.4 mmol/L (ref 0.5–1.9)

## 2022-09-28 LAB — HEPARIN LEVEL (UNFRACTIONATED): Heparin Unfractionated: 0.13 IU/mL — ABNORMAL LOW (ref 0.30–0.70)

## 2022-09-28 LAB — SAMPLE TO BLOOD BANK

## 2022-09-28 LAB — PHOSPHORUS: Phosphorus: 4.4 mg/dL (ref 2.5–4.6)

## 2022-09-28 LAB — PROCALCITONIN: Procalcitonin: 21.52 ng/mL

## 2022-09-28 SURGERY — LAPAROTOMY, EXPLORATORY
Anesthesia: General

## 2022-09-28 MED ORDER — HYDROMORPHONE HCL 1 MG/ML IJ SOLN
INTRAMUSCULAR | Status: AC
  Filled 2022-09-28: qty 1

## 2022-09-28 MED ORDER — ALBUMIN HUMAN 5 % IV SOLN
INTRAVENOUS | Status: AC
  Filled 2022-09-28: qty 250

## 2022-09-28 MED ORDER — SODIUM CHLORIDE 0.9% IV SOLUTION: Freq: Once | INTRAVENOUS | Status: DC

## 2022-09-28 MED ORDER — KETAMINE HCL 50 MG/5ML IJ SOSY
PREFILLED_SYRINGE | INTRAMUSCULAR | Status: AC
  Filled 2022-09-28: qty 5

## 2022-09-28 MED ORDER — SODIUM BICARBONATE 8.4 % IV SOLN
100.0000 meq | Freq: Once | INTRAVENOUS | Status: AC
  Administered 2022-09-28: 100 meq via INTRAVENOUS
  Filled 2022-09-28: qty 100

## 2022-09-28 MED ORDER — VASOPRESSIN 20 UNIT/ML IV SOLN
INTRAVENOUS | Status: DC | PRN
  Administered 2022-09-28 (×3): 1 [IU] via INTRAVENOUS

## 2022-09-28 MED ORDER — MIDAZOLAM HCL 2 MG/2ML IJ SOLN
INTRAMUSCULAR | Status: DC | PRN
  Administered 2022-09-28: 2 mg via INTRAVENOUS

## 2022-09-28 MED ORDER — HEPARIN SODIUM (PORCINE) 5000 UNIT/ML IJ SOLN: 5000.0000 [IU] | Freq: Three times a day (TID) | INTRAMUSCULAR | Status: DC

## 2022-09-28 MED ORDER — LIDOCAINE HCL (CARDIAC) PF 100 MG/5ML IV SOSY
PREFILLED_SYRINGE | INTRAVENOUS | Status: DC | PRN
  Administered 2022-09-28: 100 mg via INTRAVENOUS

## 2022-09-28 MED ORDER — 0.9 % SODIUM CHLORIDE (POUR BTL) OPTIME
TOPICAL | Status: DC | PRN
  Administered 2022-09-28: 6000 mL

## 2022-09-28 MED ORDER — FENTANYL CITRATE (PF) 100 MCG/2ML IJ SOLN
INTRAMUSCULAR | Status: DC | PRN
  Administered 2022-09-28: 100 ug via INTRAVENOUS

## 2022-09-28 MED ORDER — PROPOFOL 500 MG/50ML IV EMUL
INTRAVENOUS | Status: DC | PRN
  Administered 2022-09-28: 100 ug/kg/min via INTRAVENOUS

## 2022-09-28 MED ORDER — INSULIN ASPART 100 UNIT/ML IJ SOLN
0.0000 [IU] | INTRAMUSCULAR | Status: DC
  Administered 2022-09-28 – 2022-09-29 (×2): 3 [IU] via SUBCUTANEOUS
  Filled 2022-09-28 (×2): qty 1

## 2022-09-28 MED ORDER — DILTIAZEM HCL-DEXTROSE 125-5 MG/125ML-% IV SOLN (PREMIX)
5.0000 mg/h | INTRAVENOUS | Status: DC
  Administered 2022-09-28: 15 mg/h via INTRAVENOUS
  Filled 2022-09-28 (×2): qty 125

## 2022-09-28 MED ORDER — PHENYLEPHRINE HCL-NACL 20-0.9 MG/250ML-% IV SOLN
INTRAVENOUS | Status: DC | PRN
  Administered 2022-09-28: 40 ug/min via INTRAVENOUS

## 2022-09-28 MED ORDER — PROPOFOL 10 MG/ML IV BOLUS
INTRAVENOUS | Status: AC
  Filled 2022-09-28: qty 20

## 2022-09-28 MED ORDER — BUPIVACAINE-EPINEPHRINE (PF) 0.25% -1:200000 IJ SOLN
INTRAMUSCULAR | Status: DC | PRN
  Administered 2022-09-28: 30 mL

## 2022-09-28 MED ORDER — PROPOFOL 10 MG/ML IV BOLUS
INTRAVENOUS | Status: DC | PRN
  Administered 2022-09-28: 120 mg via INTRAVENOUS

## 2022-09-28 MED ORDER — ALBUMIN HUMAN 5 % IV SOLN: INTRAVENOUS | Status: DC | PRN

## 2022-09-28 MED ORDER — LACTATED RINGERS IV BOLUS
500.0000 mL | Freq: Once | INTRAVENOUS | Status: AC
  Administered 2022-09-28: 500 mL via INTRAVENOUS

## 2022-09-28 MED ORDER — DILTIAZEM HCL-DEXTROSE 125-5 MG/125ML-% IV SOLN (PREMIX)
INTRAVENOUS | Status: DC | PRN
  Administered 2022-09-28: 15 mg/h via INTRAVENOUS

## 2022-09-28 MED ORDER — LACTATED RINGERS IV SOLN: INTRAVENOUS | Status: DC | PRN

## 2022-09-28 MED ORDER — PROPOFOL 1000 MG/100ML IV EMUL
INTRAVENOUS | Status: AC
  Filled 2022-09-28: qty 100

## 2022-09-28 MED ORDER — METOPROLOL TARTRATE 25 MG PO TABS: 25.0000 mg | ORAL_TABLET | Freq: Three times a day (TID) | ORAL | Status: DC

## 2022-09-28 MED ORDER — KETAMINE HCL 50 MG/ML IJ SOLN
INTRAMUSCULAR | Status: DC | PRN
  Administered 2022-09-28: 50 mg via INTRAMUSCULAR

## 2022-09-28 MED ORDER — SODIUM CHLORIDE 0.9 % IV SOLN
INTRAVENOUS | Status: DC | PRN
  Administered 2022-09-28: 70 mL

## 2022-09-28 MED ORDER — PHENYLEPHRINE CONCENTRATED 100MG/250ML (0.4 MG/ML) INFUSION SIMPLE
0.0000 ug/min | INTRAVENOUS | Status: DC
  Administered 2022-09-28: 100 ug/min via INTRAVENOUS
  Filled 2022-09-28: qty 250

## 2022-09-28 MED ORDER — DEXMEDETOMIDINE HCL IN NACL 400 MCG/100ML IV SOLN
0.4000 ug/kg/h | INTRAVENOUS | Status: DC
  Administered 2022-09-28: 0.5 ug/kg/h via INTRAVENOUS
  Administered 2022-09-28: 0.4 ug/kg/h via INTRAVENOUS
  Filled 2022-09-28 (×2): qty 100

## 2022-09-28 MED ORDER — NOREPINEPHRINE 16 MG/250ML-% IV SOLN
0.0000 ug/min | INTRAVENOUS | Status: DC
  Administered 2022-09-28: 20 ug/min via INTRAVENOUS
  Administered 2022-09-29: 8 ug/min via INTRAVENOUS
  Filled 2022-09-28 (×2): qty 250

## 2022-09-28 MED ORDER — NOREPINEPHRINE 4 MG/250ML-% IV SOLN
INTRAVENOUS | Status: AC
  Filled 2022-09-28: qty 250

## 2022-09-28 MED ORDER — MIDAZOLAM HCL 2 MG/2ML IJ SOLN
INTRAMUSCULAR | Status: AC
  Filled 2022-09-28: qty 2

## 2022-09-28 MED ORDER — HYDROMORPHONE HCL 1 MG/ML IJ SOLN
INTRAMUSCULAR | Status: DC | PRN
  Administered 2022-09-28 (×2): .5 mg via INTRAVENOUS

## 2022-09-28 MED ORDER — FENTANYL CITRATE (PF) 100 MCG/2ML IJ SOLN
INTRAMUSCULAR | Status: AC
  Filled 2022-09-28: qty 2

## 2022-09-28 MED ORDER — VASOPRESSIN 20 UNITS/100 ML INFUSION FOR SHOCK
0.0000 [IU]/min | INTRAVENOUS | Status: DC
  Administered 2022-09-28 – 2022-09-29 (×2): 0.03 [IU]/min via INTRAVENOUS
  Filled 2022-09-28 (×2): qty 100

## 2022-09-28 MED ORDER — PHENYLEPHRINE 80 MCG/ML (10ML) SYRINGE FOR IV PUSH (FOR BLOOD PRESSURE SUPPORT)
PREFILLED_SYRINGE | INTRAVENOUS | Status: DC | PRN
  Administered 2022-09-28 (×3): 160 ug via INTRAVENOUS

## 2022-09-28 MED ORDER — ROCURONIUM BROMIDE 100 MG/10ML IV SOLN
INTRAVENOUS | Status: DC | PRN
  Administered 2022-09-28: 30 mg via INTRAVENOUS
  Administered 2022-09-28: 50 mg via INTRAVENOUS
  Administered 2022-09-28: 20 mg via INTRAVENOUS
  Administered 2022-09-28: 30 mg via INTRAVENOUS

## 2022-09-28 SURGICAL SUPPLY — 61 items
APL PRP STRL LF DISP 70% ISPRP (MISCELLANEOUS) ×1
APPLIER CLIP 11 MED OPEN (CLIP)
APPLIER CLIP 13 LRG OPEN (CLIP)
APR CLP LRG 13 20 CLIP (CLIP)
APR CLP MED 11 20 MLT OPN (CLIP)
BARRIER ADH SEPRAFILM 3INX5IN (MISCELLANEOUS) IMPLANT
BLADE CLIPPER SURG (BLADE) ×2 IMPLANT
BLADE SURG SZ10 CARB STEEL (BLADE) ×1 IMPLANT
BNDG GAUZE DERMACEA FLUFF 4 (GAUZE/BANDAGES/DRESSINGS) IMPLANT
BNDG GZE DERMACEA 4 6PLY (GAUZE/BANDAGES/DRESSINGS) ×1
BRR ADH 5X3 SEPRAFILM 2 SHT (MISCELLANEOUS)
BULB RESERV EVAC DRAIN JP 100C (MISCELLANEOUS) IMPLANT
CHLORAPREP W/TINT 26 (MISCELLANEOUS) ×1 IMPLANT
CLIP APPLIE 11 MED OPEN (CLIP) IMPLANT
CLIP APPLIE 13 LRG OPEN (CLIP) IMPLANT
COVER BACK TABLE REUSABLE LG (DRAPES) ×1 IMPLANT
DRAIN CHANNEL JP 19F (MISCELLANEOUS) IMPLANT
DRAPE LAPAROTOMY 100X77 ABD (DRAPES) ×1 IMPLANT
DRSG OPSITE POSTOP 4X10 (GAUZE/BANDAGES/DRESSINGS) IMPLANT
DRSG OPSITE POSTOP 4X12 (GAUZE/BANDAGES/DRESSINGS) IMPLANT
ELECT BLADE 6.5 EXT (BLADE) ×1 IMPLANT
ELECT REM PT RETURN 9FT ADLT (ELECTROSURGICAL) ×1
ELECTRODE REM PT RTRN 9FT ADLT (ELECTROSURGICAL) ×1 IMPLANT
GAUZE 4X4 16PLY ~~LOC~~+RFID DBL (SPONGE) ×1 IMPLANT
GLOVE BIO SURGEON STRL SZ7 (GLOVE) ×1 IMPLANT
GOWN STRL REUS W/ TWL LRG LVL3 (GOWN DISPOSABLE) ×4 IMPLANT
GOWN STRL REUS W/TWL LRG LVL3 (GOWN DISPOSABLE) ×2
HANDLE SUCTION POOLE (INSTRUMENTS) ×1 IMPLANT
HANDLE YANKAUER SUCT BULB TIP (MISCELLANEOUS) ×1 IMPLANT
KIT OSTOMY 2 PC DRNBL 2.25 STR (WOUND CARE) IMPLANT
KIT OSTOMY DRAINABLE 2.25 STR (WOUND CARE) ×1
LIGASURE IMPACT 36 18CM CVD LR (INSTRUMENTS) IMPLANT
MANIFOLD NEPTUNE II (INSTRUMENTS) ×1 IMPLANT
NEEDLE HYPO 22GX1.5 SAFETY (NEEDLE) ×2 IMPLANT
NS IRRIG 1000ML POUR BTL (IV SOLUTION) IMPLANT
PACK BASIN MAJOR ARMC (MISCELLANEOUS) ×1 IMPLANT
PAD ABD DERMACEA PRESS 5X9 (GAUZE/BANDAGES/DRESSINGS) IMPLANT
RELOAD PROXIMATE 75MM BLUE (ENDOMECHANICALS) IMPLANT
RELOAD STAPLE 75 3.8 BLU REG (ENDOMECHANICALS) IMPLANT
SPONGE T-LAP 18X18 ~~LOC~~+RFID (SPONGE) ×3 IMPLANT
SPONGE T-LAP 18X36 ~~LOC~~+RFID STR (SPONGE) IMPLANT
STAPLER PROXIMATE 75MM BLUE (STAPLE) IMPLANT
STAPLER SKIN PROX 35W (STAPLE) ×1 IMPLANT
SUCTION POOLE HANDLE (INSTRUMENTS)
SUT ETHILON 3-0 FS-10 30 BLK (SUTURE) ×1
SUT PDS AB 0 CT1 27 (SUTURE) ×3 IMPLANT
SUT SILK 2 0 (SUTURE) ×1
SUT SILK 2 0 SH CR/8 (SUTURE) ×1 IMPLANT
SUT SILK 2 0SH CR/8 30 (SUTURE) ×1 IMPLANT
SUT SILK 2-0 18XBRD TIE 12 (SUTURE) ×1 IMPLANT
SUT VIC AB 0 CT1 36 (SUTURE) ×2 IMPLANT
SUT VIC AB 2-0 SH 27 (SUTURE) ×2
SUT VIC AB 2-0 SH 27XBRD (SUTURE) ×2 IMPLANT
SUT VIC AB 3-0 SH 27 (SUTURE) ×4
SUT VIC AB 3-0 SH 27X BRD (SUTURE) ×2 IMPLANT
SUTURE EHLN 3-0 FS-10 30 BLK (SUTURE) IMPLANT
SYR 30ML LL (SYRINGE) ×2 IMPLANT
SYR 3ML LL SCALE MARK (SYRINGE) ×1 IMPLANT
TRAP FLUID SMOKE EVACUATOR (MISCELLANEOUS) ×1 IMPLANT
TRAY FOLEY MTR SLVR 16FR STAT (SET/KITS/TRAYS/PACK) ×2 IMPLANT
WATER STERILE IRR 500ML POUR (IV SOLUTION) ×1 IMPLANT

## 2022-09-28 NOTE — Progress Notes (Signed)
  Progress Note   Date: 09/28/2022  Patient Name: Nicolas Zuniga        MRN#: [id number]  Clarification of the diagnosis of diabetes:   Diabetes mellitus Type II with hyperglycemia

## 2022-09-28 NOTE — Transfer of Care (Signed)
Immediate Anesthesia Transfer of Care Note  Patient: ORA MCNATT  Procedure(s) Performed: EXPLORATORY LAPAROTOMY WITH OSTOMY CREATION  Patient Location: ICU  Anesthesia Type:General  Level of Consciousness: sedated and Patient remains intubated per anesthesia plan  Airway & Oxygen Therapy: Patient remains intubated per anesthesia plan  Post-op Assessment: Report given to RN and Post -op Vital signs reviewed and stable  Post vital signs: Reviewed and stable  Last Vitals:  Vitals Value Taken Time  BP    Temp    Pulse    Resp    SpO2      Last Pain:  Vitals:   09/28/22 1100  TempSrc:   PainSc: 0-No pain         Complications: No notable events documented.

## 2022-09-28 NOTE — Progress Notes (Signed)
PROGRESS NOTE - follow up on consult    Nicolas Zuniga   OVF:[id number] DOB: [birth year]  DOA: 09/24/2022 Date of Service: 09/28/22 PCP: Hortencia Pilar, MD  Requesting physician: Dr. Dahlia Byes Date of initial consultation: 09/25/22  Reason for consultation: Hypotension     Brief Narrative / Hospital Course:  Nicolas Zuniga is a 66 y.o. male with hx of recently diagnosed adenocarcinoma of the colon who is currently admitted status post right colectomy, diabetes, hypertension, and A-fib status post multiple ablations currently on metoprolol and flecainide.  09/24/2022 - Underwent a laparoscopic right colectomy that was converted to open right colectomy along with a cholecystectomy - no significant complications and EBL was minimal at 80 cc.   10/20: Seen early morning by Dr. Dahlia Byes (general surgery) and was doing well.  Starting around 6 PM he began to have hypotension 70/41 and the hospitalist service was consulted. Pt was given total of 25 g of albumin, 2 L of normal saline, and started on glucagon gtt concern for beta blocker toxicity. His metoprolol and flecainide were held. BP improved. Mild AKI on labs Cr 1.93 down form admission 1.23, likely hypovolemia. D/c glucagon. Strict I&O. Continue hold beta blocker and flecainide, may reduce dose metoprolol on discharge.  10/21: BP soft but improved 115/53, 117/64. Cr improved 1.66. Hgb stable.  Rapid response overnight, A-fib RVR.  Transferred to stepdown, amiodarone infusion, minimal response, diltiazem infusion.  Dr. Humphrey Rolls, cardiology, aware. Dr Dahlia Byes ordering CT chest/abdomen/pelvis, starting heparin drip. CT no PE, (+) Small bowel leak, certainly no contrast extravasation and seems contained --> plan repeat CT, hold off on surgical intervention unless emergent, starting abx.  10/23: Patient went for repeat CT noting increased extravasation concerning for anastomotic leak.  Patient taken back to the OR and well no leak found, underwent bowel  follow-through and washout.  Postop, patient in ICU and still intubated.  ABG notes severe respiratory acidosis.  Note that should patient remain on ventilator, will have critical care assumed medical management in conjunction with surgery.      ASSESSMENT & PLAN:   Principal Problem:   S/P right colectomy Active Problems:   Diabetes ([place])   Essential hypertension   Paroxysmal A-fib (HCC)   S/P partial resection of colon   Hypotension   Hyponatremia   AKI (acute kidney injury) ([place])   Hypotension Likely hypovolemia I&O given lack of significant underlying cardiac disease okay for more fluid resuscitation if necessary Continue holding metoprolol and flecainide for the time being.   Would not be unreasonable to reduce his dose of metoprolol or continue to hold on discharge   A-fib RVR on Chronic paroxysmal A-fib (HCC) Postop hypotension, initially held metoprolol and flecainide.  After transferred to stepdown, now on amiodarone drip and Cardizem drip.  Cardiology following.  TSH unremarkable.    Diabetes ([place]) insulin sliding scale correction factor  Hyponatremia Likely hypovolemic trend BMP --> improved this morning   AKI (acute kidney injury) ([place]) Likely prerenal in setting of surgery as well as patient reported decreased p.o. intake in the last several days trend BMP status post fluid resuscitation --> not quite back to baseline but Cr is improved on 10/21 and stable on 10/22  Colon cancer, cholelithiasis 10/19: Attempted hand-assisted laparoscopic right colectomy, converted to open right colectomy with ileocolic anastomosis, open cholecystectomy. Repeat CT as above.  Underwent exploratory laparotomy with no leak found.  SOB / tachypnea without hypoxia  CTA chest no PE   Prostate nodule incidental finding on  Follow outpatient     Subjective:  Currently intubated  Objective:  Vitals:   09/28/22 1545 09/28/22 1600 09/28/22 1615 09/28/22 1630  BP: 119/62  123/65 (!) 146/60 (!) 140/60  Pulse: (!) 104 89 (!) 149 (!) 112  Resp: (!) 22 (!) 22 (!) 22 (!) 22  Temp:      TempSrc:      SpO2: 93% 93% 93% 94%  Weight:      Height:        Intake/Output Summary (Last 24 hours) at 09/28/2022 1742 Last data filed at 09/28/2022 1410 Gross per 24 hour  Intake 3077.85 ml  Output 970 ml  Net 2107.85 ml    Filed Weights   09/24/22 1038 09/27/22 0148  Weight: 124.7 kg 118 kg    Examination:  General: Debated, sedated HEENT: Intubated Cardiovascular: Irregular rhythm, tachycardic Lungs: Moderate inspiratory effort, decreased breath sounds bibasilar Abdomen: Wounds packed Extremities: No clubbing or cyanosis, 1+ pitting edema Neuro: Currently sedated on ventilator Psychiatric: Unable to assess due to sedation       Scheduled Medications:   acetaminophen  1,000 mg Oral Q6H   Chlorhexidine Gluconate Cloth  6 each Topical Daily   insulin aspart  0-15 Units Subcutaneous Q4H   pantoprazole (PROTONIX) IV  40 mg Intravenous QHS   sodium chloride flush  10-40 mL Intracatheter Q12H    Continuous Infusions:  amiodarone 30 mg/hr (09/28/22 1132)   dexmedetomidine (PRECEDEX) IV infusion 0.4 mcg/kg/hr (09/28/22 1700)   diltiazem (CARDIZEM) infusion     norepinephrine (LEVOPHED) Adult infusion 20 mcg/min (09/28/22 1712)   phenylephrine (NEO-SYNEPHRINE) Adult infusion 90 mcg/min (09/28/22 1615)   piperacillin-tazobactam (ZOSYN)  IV 3.375 g (09/28/22 1447)    PRN Medications:  morphine injection, ondansetron **OR** ondansetron (ZOFRAN) IV, mouth rinse, oxyCODONE, prochlorperazine **OR** prochlorperazine, sodium chloride flush  Antimicrobials:  Anti-infectives (From admission, onward)    Start     Dose/Rate Route Frequency Ordered Stop   09/27/22 1400  piperacillin-tazobactam (ZOSYN) IVPB 3.375 g        3.375 g 12.5 mL/hr over 240 Minutes Intravenous Every 8 hours 09/27/22 1214     09/24/22 2200  cefoTEtan (CEFOTAN) 2 g in sodium chloride 0.9  % 100 mL IVPB        2 g 200 mL/hr over 30 Minutes Intravenous Every 12 hours 09/24/22 1717 09/25/22 0954   09/24/22 1530  metroNIDAZOLE (FLAGYL) IVPB 500 mg        500 mg 100 mL/hr over 60 Minutes Intravenous  Once 09/24/22 1515 09/24/22 1525   09/24/22 0600  ceFAZolin (ANCEF) IVPB 3g/100 mL premix  Status:  Discontinued        3 g 200 mL/hr over 30 Minutes Intravenous On call to O.R. 09/24/22 0112 09/24/22 1717       Data Reviewed: I have personally reviewed following labs and imaging studies  CBC: Recent Labs  Lab 09/26/22 0412 09/26/22 2311 09/27/22 0332 09/28/22 0459 09/28/22 1450  WBC 15.3* 9.6 8.5 10.8* 14.2*  NEUTROABS  --   --   --   --  9.3*  HGB 8.8* 9.1* 9.3* 9.1* 7.6*  HCT 28.4* 30.0* 31.1* 30.1* 26.0*  MCV 78.0* 79.2* 79.3* 79.2* 80.2  PLT 198 193 208 303 702    Basic Metabolic Panel: Recent Labs  Lab 09/25/22 0613 09/25/22 2138 09/26/22 0412 09/26/22 2311 09/27/22 0332 09/28/22 0459 09/28/22 1450  NA 131* 130* 132* 132* 133* 128*  --   K 4.3 3.7 4.3 4.7 4.3 4.0  --  CL 100 102 105 104 103 96*  --   CO2 17* 17* 17* 15* 18* 16*  --   GLUCOSE 232* 161* 204* 103* 84 204*  --   BUN 22 22 25* 23 22 25*  --   CREATININE 1.23 1.93* 1.66* 1.45* 1.45* 1.86*  --   CALCIUM 8.4* 8.0* 8.3* 8.9 8.9 8.4*  --   MG 1.7  --   --   --  1.5* 1.9  --   PHOS  --   --   --   --   --   --  4.4    GFR: Estimated Creatinine Clearance: 53.3 mL/min (A) (by C-G formula based on SCr of 1.86 mg/dL (H)). Liver Function Tests: Recent Labs  Lab 09/26/22 0412 09/26/22 2311 09/28/22 1449  AST 29 32 22  ALT '16 14 10  '$ ALKPHOS 43 40 50  BILITOT 0.5 0.9 0.8  PROT 5.4* 6.0* 4.4*  ALBUMIN 2.7* 2.9* 2.2*    No results for input(s): "LIPASE", "AMYLASE" in the last 168 hours. No results for input(s): "AMMONIA" in the last 168 hours. Coagulation Profile: Recent Labs  Lab 09/27/22 1222 09/28/22 0458  INR 2.0* 2.1*    Cardiac Enzymes: No results for input(s): "CKTOTAL",  "CKMB", "CKMBINDEX", "TROPONINI" in the last 168 hours. BNP (last 3 results) No results for input(s): "PROBNP" in the last 8760 hours. HbA1C: No results for input(s): "HGBA1C" in the last 72 hours.  CBG: Recent Labs  Lab 09/27/22 2313 09/28/22 0338 09/28/22 0745 09/28/22 1434 09/28/22 1715  GLUCAP 91 81 93 123* 158*    Lipid Profile: No results for input(s): "CHOL", "HDL", "LDLCALC", "TRIG", "CHOLHDL", "LDLDIRECT" in the last 72 hours. Thyroid Function Tests: Recent Labs    09/27/22 0332  TSH 3.741  FREET4 0.94    Anemia Panel: No results for input(s): "VITAMINB12", "FOLATE", "FERRITIN", "TIBC", "IRON", "RETICCTPCT" in the last 72 hours. Urine analysis:    Component Value Date/Time   COLORURINE YELLOW 08/08/2021 1249   APPEARANCEUR CLEAR 08/08/2021 1249   APPEARANCEUR Cloudy 06/13/2012 1253   LABSPEC >1.030 (H) 08/08/2021 1249   LABSPEC 1.026 06/13/2012 1253   PHURINE 5.0 08/08/2021 1249   GLUCOSEU NEGATIVE 08/08/2021 1249   GLUCOSEU 50 mg/dL 06/13/2012 1253   HGBUR TRACE (A) 08/08/2021 1249   BILIRUBINUR NEGATIVE 08/08/2021 1249   BILIRUBINUR Negative 06/13/2012 1253   KETONESUR TRACE (A) 08/08/2021 1249   PROTEINUR NEGATIVE 08/08/2021 1249   NITRITE NEGATIVE 08/08/2021 1249   LEUKOCYTESUR NEGATIVE 08/08/2021 1249   LEUKOCYTESUR Negative 06/13/2012 1253   Sepsis Labs: '@LABRCNTIP'$ (procalcitonin:4,lacticidven:4)  Recent Results (from the past 240 hour(s))  MRSA Next Gen by PCR, Nasal     Status: None   Collection Time: 09/27/22  1:53 AM   Specimen: Nasal Mucosa; Nasal Swab  Result Value Ref Range Status   MRSA by PCR Next Gen NOT DETECTED NOT DETECTED Final    Comment: (NOTE) The GeneXpert MRSA Assay (FDA approved for NASAL specimens only), is one component of a comprehensive MRSA colonization surveillance program. It is not intended to diagnose MRSA infection nor to guide or monitor treatment for MRSA infections. Test performance is not FDA approved in  patients less than 58 years old. Performed at [facility], [address] [place], [place] [address]          Radiology Studies: No results found.          LOS: 4 days       Annita Brod, DO [facility]  09/28/2022, 5:42 PM   Staff may message me via secure chat in [place]  but this may not receive immediate response,  please page for urgent matters!  If 7PM-7AM, please contact night-coverage [email or web address]  Dictation software was used to generate the above note. Typos may occur and escape review, as with typed/written notes. Please contact Dr Sheppard Coil directly for clarity if needed.

## 2022-09-28 NOTE — Progress Notes (Addendum)
1410 Patient received from OR via bed intubated,sedated and unresponsive. No gag or cough noted. No spontaneous move 1600 B/P improving , weaning Neo. 1645 Remains unresponsive. No gag or cough. Pupils 2 mm. [place] to follow simple commands. Opens eys and nods to questions. Wife with patient.

## 2022-09-28 NOTE — Anesthesia Preprocedure Evaluation (Signed)
Anesthesia Evaluation  Patient identified by MRN, date of birth, ID band Patient awake    Reviewed: Allergy & Precautions, NPO status , Patient's Chart, lab work & pertinent test results  History of Anesthesia Complications Negative for: history of anesthetic complications  Airway Mallampati: III  TM Distance: <3 FB Neck ROM: full    Dental  (+) Chipped   Pulmonary shortness of breath (secondary to a fib), neg sleep apnea, neg recent URI,    Pulmonary exam normal        Cardiovascular Exercise Tolerance: Good hypertension, (-) angina+ CAD  (-) Past MI and (-) Cardiac Stents + dysrhythmias Atrial Fibrillation (-) Valvular Problems/Murmurs     Neuro/Psych PSYCHIATRIC DISORDERS Anxiety negative neurological ROS     GI/Hepatic negative GI ROS, Neg liver ROS, neg GERD  ,  Endo/Other  diabetes, Type 2  Renal/GU Renal disease  negative genitourinary   Musculoskeletal   Abdominal   Peds  Hematology negative hematology ROS (+)   Anesthesia Other Findings Past Medical History: No date: A-fib ([place]) No date: Diabetes mellitus without complication (HCC) No date: Hypertension  Past Surgical History: 12/22/2018: CARDIOVERSION; N/A     Comment:  Procedure: CARDIOVERSION (CATH LAB);  Surgeon: Corey Skains, MD;  Location: [facility] ORS;  Service:               Cardiovascular;  Laterality: N/A; 08/18/2021: CARDIOVERSION; N/A     Comment:  Procedure: CARDIOVERSION;  Surgeon: Corey Skains,               MD;  Location: [facility] ORS;  Service: Cardiovascular;                Laterality: N/A; 03/11/2022: CARDIOVERSION; N/A     Comment:  Procedure: CARDIOVERSION;  Surgeon: Corey Skains,               MD;  Location: [facility] ORS;  Service: Cardiovascular;                Laterality: N/A; 03/31/2022: CARDIOVERSION; N/A     Comment:  Procedure: CARDIOVERSION;  Surgeon: Corey Skains,               MD;  Location: [facility]  ORS;  Service: Cardiovascular;                Laterality: N/A; 07/09/2016: ELECTROPHYSIOLOGIC STUDY; N/A     Comment:  Procedure: CARDIOVERSION;  Surgeon: Dionisio David, MD;               Location: [facility] ORS;  Service: Cardiovascular;                Laterality: N/A; 07/09/2016: TEE WITHOUT CARDIOVERSION; N/A     Comment:  Procedure: TRANSESOPHAGEAL ECHOCARDIOGRAM (TEE);                Surgeon: Dionisio David, MD;  Location: [facility] ORS;                Service: Cardiovascular;  Laterality: N/A;     Reproductive/Obstetrics negative OB ROS                             Anesthesia Physical  Anesthesia Plan  ASA: 3 and emergent  Anesthesia Plan: General   Post-op Pain Management:    Induction: Intravenous  PONV Risk Score and Plan: Ondansetron, Dexamethasone and Treatment  may vary due to age or medical condition  Airway Management Planned: Oral ETT  Additional Equipment:   Intra-op Plan:   Post-operative Plan: Extubation in OR and Possible Post-op intubation/ventilation  Informed Consent: I have reviewed the patients History and Physical, chart, labs and discussed the procedure including the risks, benefits and alternatives for the proposed anesthesia with the patient or authorized representative who has indicated his/her understanding and acceptance.     Dental Advisory Given  Plan Discussed with: Anesthesiologist, CRNA and Surgeon  Anesthesia Plan Comments: (Patient consented for risks of anesthesia including but not limited to:  - adverse reactions to medications - risk of airway placement if required - damage to eyes, teeth, lips or other oral mucosa - nerve damage due to positioning  - sore throat or hoarseness - Damage to heart, brain, nerves, lungs, other parts of body or loss of life  Patient voiced understanding.)        Anesthesia Quick Evaluation

## 2022-09-28 NOTE — Anesthesia Procedure Notes (Signed)
Procedure Name: Intubation Date/Time: 09/28/2022 11:40 AM  Performed by: Willette Alma, CRNAPre-anesthesia Checklist: Patient identified, Patient being monitored, Timeout performed, Emergency Drugs available and Suction available Patient Re-evaluated:Patient Re-evaluated prior to induction Oxygen Delivery Method: Circle system utilized Preoxygenation: Pre-oxygenation with 100% oxygen Induction Type: IV induction Ventilation: Mask ventilation without difficulty Laryngoscope Size: 4 and McGraph Grade View: Grade I Tube type: Oral Tube size: 7.0 mm Number of attempts: 1 Airway Equipment and Method: Stylet Placement Confirmation: ETT inserted through vocal cords under direct vision, positive ETCO2 and breath sounds checked- equal and bilateral Secured at: 24 cm Tube secured with: Tape Dental Injury: Teeth and Oropharynx as per pre-operative assessment

## 2022-09-28 NOTE — Progress Notes (Signed)
Patient seen and examined.  His INR went up with worsening kidney function. Still a fib RVR but rate is better controlled Abd: not peritonitic incision c/d/I Ct pers reviewed and d/w rads. No overt contrast extravasation but more flud in the gutter D/W the pt in detail. I do recommend exploration and likely diverting ostomy. He likely has a small microscopic leak but given his reserve I do not think he will be able to seal it. I rather take him back before he decompensates. Procedure d/w the pt in detail. Risks, benefits and possible complications. They understand.

## 2022-09-28 NOTE — Progress Notes (Signed)
Patient transported to CT and back to ICU without event. Wife at bedside. Pabon updated patient and wife at bedside.

## 2022-09-28 NOTE — Consult Note (Signed)
NAME:  Nicolas Zuniga, MRN:  [id number], DOB:  1955/12/09, LOS: 4 ADMISSION DATE:  09/24/2022, CONSULTATION DATE: 09/28/22 REFERRING MD: Dr. Dahlia Byes   History of Present Illness:  This is a 66 yo male with right colon cancer and cholelithiasis who presented to [facility] on 10/19 for laparoscopic right colectomy and cholecystectomy.  Surgical findings were right colon mass with evidence of bulky mesenteric nodal involvement; large right colon mass adhered to the retroperitoneum; mesenteric nodes within terminal ileum; tension free anastomosis, no evidence of intraop leak and good perfusion; and chronic calculus cholecystitis.  Postop pt admitted to the medsurg unit per general surgery for additional workup and treatment.  See detailed hospital course under significant events. 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  Hospitalist team consulted to assist with management all beta blockers and flecainide discontinued  10/22: Pt developed atrial fibrillation with rvr, acute respiratory failure, and hypotension with questionable episode of Vtach, rapid response initiated.  Pt required amiodarone bolus followed by drip and transfer to the stepdown unit.  Rate remained uncontrolled and cardizem gtt added '@15'$  mg/hr in addition to amiodarone gtt.  Cardiology consulted   10/22: CT Abd Pelvis concerning for  possible small leak but no contrast extravasation.  General Surgery recommended- antibiotic therapy; repeat scan in 24-48hrs; and keep NPO except meds  10/23: revealed no evidence of gross enteric contrast extravasation but increased fluid collection adjacent to the anastomosis and slight increase in fluid volume in the right paracolic gutter.  Pt taken back to the OR for exploratory laparotomy with findings of no obvious anastomotic leak or evidence of large or small bowel injuries but intra-abdominal purulent fluid with significant inflammatory response present. Abdominal washout with placement of JP drain and diverting loop colostomy. Pt remained mechanically intubated.  PCCM        team consulted plans for SBT and possible extubation   Significant Test:  10/22: CT Abd Pelvis Ill-defined slightly high density fluid along the ileocolic suture line, which may represent a small amount of extravasated contrast/leak. Small to moderate amount of pneumoperitoneum and small amount of ascites within the abdomen and pelvis, not unexpected given surgery but  nonspecific. No evidence of focal collection/defined abscess. Trace bilateral pleural effusions and bibasilar atelectasis. 2 low-density areas within the prostate gland, nonspecific but correlate with PSA. Aortic Atherosclerosis (ICD10-I70.0). 10/22: CTA Chest no definite evidence of pulmonary embolus. Minimal bilateral pleural effusions are noted with adjacent subsegmental atelectasis. Pneumoperitoneum is noted in visualized portion of upper abdomen consistent with reported history of recent laparoscopic right colectomy and cholecystectomy. 10/23: CT Abd Pelvis while there is no evidence for gross enteric contrast extravasation at the level of the ileocolic anastomosis, the small fluid collection adjacent to the anastomosis identified on yesterday's study has increased in attenuation in the interval. This increase in attenuation is concerning for some  trace spill of contrast from the anastomosis into the collection. Slight increase in fluid volume in the right paracolic gutter with similar fluid  volume around the liver and in the left paracolic gutter. Collections of interloop mesenteric fluid are not substantially changed. Free gas in the peritoneal cavity is not unexpected 4 days out from surgery. Gas in the midline subcutaneous tissues deep to the staple line has increased in the interval. Contrast in the kidneys compatible with residual from yesterday's infused scan. Bibasilar atelectasis with tiny bilateral pleural effusions. Aortic Atherosclerosis (ICD10-I70.0).  Interim History / Subjective:  Pt currently nonresponsive postop after receiving sedating medication and paralytic.  Current vent setting are PEEP 5/FiO2 40%.  Pt hypotensive will require neo-synephrine gtt.  Pt remains in atrial fibrillation hr better controlled in the 90's on amiodarone and cardizem gtts   Objective   Blood pressure (!) 96/58, pulse (!) 103, temperature 99.7 F (37.6 C), temperature source Axillary, resp. rate (!) 29, height '6\' 2"'$  (1.88 m), weight 118 kg, SpO2 93 %.        Intake/Output Summary (Last 24 hours) at 09/28/2022 1358 Last data filed at 09/28/2022 1346 Gross per 24 hour  Intake 3077.85 ml  Output 871 ml  Net 2206.85 ml   Filed Weights   09/24/22 1038 09/27/22 0148  Weight: 124.7 kg 118 kg   Examination: General: Acutely-ill appearing male, sedated and mechanically intubated  HENT: Supple, no JVD presented  Lungs: Clear diminished throughout, even, non labored  Cardiovascular: Irregular irregular, no r/g, 1+ radial/1+ distal pulses presented, 1+ generalized edema present  Abdomen: No audible BS present, non distended, midline abdominal incision with dressing dry/intact; JP drain present with sanguinous drainage; right quadrant diverting loop colostomy  Extremities: Normal tone and bulk  Neuro: Unresponsive/sedated, not following commands,  PERRL GU: Indwelling foley catheter in place draining tea colored urine   Resolved Hospital Problem list   Bradycardia   Assessment & Plan:  Acute respiratory failure secondary to atrial fibrillation with rvr  Mechanical intubation for airway protection intraoperatively - Target PRVC 8 cc/kg - Wean PEEP and FiO2 as able to maintain O2 sats >92% - Goal plateau pressure less than 30, driving pressure less than 15 - Intermittent ABG and CXR  - Maintain RASS goal 0  - Sedation on hold in preparation for SBT and possible extubation  - VAP prevention order set  Atrial fibrillation with rvr  Hypotension secondary to hypovolemia and possible sepsis  Hx: HTN and Chronic diastolic dysfunction  - Continuous telemetry monitoring  - Echo and troponin pending - Continue amiodarone and cardizem gtts for now for rate control - Maintenance iv fluids and prn neo-synephrine gtt to maintain map >65 - Hold outpatient antiarrhythmic and beta-blocker therapy for now due to hypotension  - Will check CBC  - Cardiology consulted appreciate input   Acute kidney injury secondary ATN  Hyponatremia  Anion gap metabolic acidosis  - Trend BMP  - Lactic acid pending  - Replace electrolytes as indicated  - Monitor UOP - Avoid nephrotoxic medications  - Continue iv maintenance fluids   Right colon cancer and cholelithiasis s/p laparoscopic right colectomy and cholecystectomy: 09/24/22 Intra-abdominal infection s/p exploratory laparotomy with abdominal washout and diverting loop ileostomy 10/23 - Trend WBC and monitor fever curve  - Trend PCT  - Follow cultures  - Continue zosyn  - General Surgery primary appreciate input  - NGT LIS   Type II diabetes mellitus  - CBG's q4hrs  - SSI - Target range 140-180    Best Practice (right click and "Reselect all SmartList Selections" daily)  Diet/type: NPO DVT prophylaxis: SCD GI prophylaxis: PPI  Lines: Right upper extremity PICC and still needed  Foley:   Yes, and it is still needed Code Status:  full code Last date of multidisciplinary goals of care discussion [N/A]  Labs   CBC: Recent Labs  Lab 09/25/22 2138 09/26/22 0412 09/26/22 2311 09/27/22 0332 09/28/22 0459  WBC 16.0* 15.3* 9.6 8.5 10.8*  HGB 8.6* 8.8* 9.1* 9.3* 9.1*  HCT 28.0* 28.4* 30.0* 31.1* 30.1*  MCV 78.2* 78.0* 79.2* 79.3* 79.2*  PLT 181 198 193 208 937    Basic Metabolic Panel: Recent Labs  Lab 09/25/22 0613 09/25/22 2138 09/26/22 0412 09/26/22 2311 09/27/22 0332 09/28/22 0459  NA 131* 130* 132* 132* 133* 128*  K 4.3 3.7 4.3 4.7 4.3 4.0  CL 100 102 105 104 103 96*  CO2 17* 17* 17* 15* 18* 16*  GLUCOSE 232* 161* 204* 103* 84 204*  BUN 22 22 25* 23 22 25*  CREATININE 1.23 1.93* 1.66* 1.45* 1.45* 1.86*  CALCIUM 8.4* 8.0* 8.3* 8.9 8.9 8.4*  MG 1.7  --   --   --  1.5* 1.9   GFR: Estimated Creatinine Clearance: 53.3 mL/min (A) (by C-G formula based on SCr of 1.86 mg/dL (H)). Recent Labs  Lab 09/26/22 0412 09/26/22 2311 09/27/22 0332 09/28/22 0459  WBC 15.3* 9.6 8.5 10.8*    Liver Function Tests: Recent Labs  Lab 09/26/22 0412 09/26/22 2311  AST 29 32  ALT 16 14  ALKPHOS 43 40  BILITOT 0.5 0.9  PROT 5.4* 6.0*  ALBUMIN 2.7* 2.9*   No results for input(s): "LIPASE", "AMYLASE" in the last 168 hours. No results for input(s): "AMMONIA" in the last 168 hours.  ABG    Component Value Date/Time   HCO3 15.3 (L) 09/26/2022 2311   ACIDBASEDEF 9.2 (H) 09/26/2022 2311   O2SAT PENDING 09/26/2022 2311     Coagulation Profile: Recent Labs  Lab 09/27/22 1222 09/28/22 0458  INR 2.0* 2.1*    Cardiac Enzymes: No results for input(s): "CKTOTAL", "CKMB", "CKMBINDEX", "TROPONINI" in the last 168 hours.  HbA1C: Hemoglobin A1C  Date/Time Value Ref Range Status  06/16/2012 10:17 AM 7.2 (H) 4.2 - 6.3 % Final    Comment:    The American Diabetes Association recommends that a primary goal of therapy should be <7% and that physicians should  reevaluate the treatment regimen in patients with HbA1c values consistently >8%.    Hgb A1c MFr Bld  Date/Time Value Ref Range Status  09/24/2022 05:44 PM 9.8 (H) 4.8 - 5.6 % Final    Comment:    (NOTE) Pre diabetes:          5.7%-6.4%  Diabetes:              >6.4%  Glycemic control for   <7.0% adults with diabetes   08/08/2021 01:43 PM 7.4 (H) 4.8 - 5.6 % Final    Comment:    (NOTE) Pre diabetes:          5.7%-6.4%  Diabetes:              >6.4%  Glycemic control for   <7.0% adults with diabetes     CBG: Recent Labs  Lab 09/27/22 1544 09/27/22 1940 09/27/22 2313 09/28/22 0338 09/28/22 0745  GLUCAP 108* 98 91 81 93    Review of Systems:   Unable to assess pt mechanically intubated   Past Medical History:  He,  has a past medical history of Adenocarcinoma of colon ([place]) (09/08/2022), Cholelithiasis, Chronic gastritis, Colon polyps, Coronary artery  calcification seen on CT scan, Diastolic dysfunction, Diverticulosis, Hypertension, Long term current use of anticoagulant, Nephrolithiasis, Paroxysmal A-fib ([place]), and T2DM (type 2 diabetes mellitus) ([place]).   Surgical History:   Past Surgical History:  Procedure Laterality Date   CARDIOVERSION N/A 12/22/2018   Procedure: CARDIOVERSION (CATH LAB);  Surgeon: Corey Skains, MD;  Location: [facility] ORS;  Service: Cardiovascular;  Laterality: N/A;   CARDIOVERSION N/A 08/18/2021   Procedure: CARDIOVERSION;  Surgeon: Corey Skains, MD;  Location: [facility] ORS;  Service: Cardiovascular;  Laterality: N/A;   CARDIOVERSION N/A 03/11/2022   Procedure: CARDIOVERSION;  Surgeon: Corey Skains, MD;  Location: [facility] ORS;  Service: Cardiovascular;  Laterality: N/A;   CARDIOVERSION N/A 03/31/2022   Procedure: CARDIOVERSION;  Surgeon: Corey Skains, MD;  Location: [facility] ORS;  Service: Cardiovascular;  Laterality: N/A;   CHOLECYSTECTOMY N/A 09/24/2022   Procedure: LAPAROSCOPIC CHOLECYSTECTOMY;  Surgeon: Jules Husbands, MD;   Location: [facility] ORS;  Service: General;  Laterality: N/A;   COLONOSCOPY WITH PROPOFOL N/A 09/08/2022   Procedure: COLONOSCOPY WITH PROPOFOL;  Surgeon: Lucilla Lame, MD;  Location: [facility] ENDOSCOPY;  Service: Endoscopy;  Laterality: N/A;   ELECTROPHYSIOLOGIC STUDY N/A 07/09/2016   Procedure: CARDIOVERSION;  Surgeon: Dionisio David, MD;  Location: [facility] ORS;  Service: Cardiovascular;  Laterality: N/A;   LAPAROSCOPIC RIGHT COLECTOMY N/A 09/24/2022   Procedure: LAPAROSCOPIC RIGHT COLECTOMY, RNFA to assist;  Surgeon: Jules Husbands, MD;  Location: [facility] ORS;  Service: General;  Laterality: N/A;   TEE WITHOUT CARDIOVERSION N/A 07/09/2016   Procedure: TRANSESOPHAGEAL ECHOCARDIOGRAM (TEE);  Surgeon: Dionisio David, MD;  Location: [facility] ORS;  Service: Cardiovascular;  Laterality: N/A;   web fingers repaired     as a child     Social History:   reports that he has never smoked. He has never been exposed to tobacco smoke. He has never used smokeless tobacco. He reports that he does not drink alcohol and does not use drugs.   Family History:  His family history includes COPD in his father; Diabetes in his mother; Heart disease in his mother; Varicose Veins in his mother.   Allergies No Known Allergies   Home Medications  Prior to Admission medications   Medication Sig Start Date End Date Taking? Authorizing Provider  bisacodyl (DULCOLAX) 5 MG EC tablet Take all 4 tablets at 8 am the morning prior to your surgery. 09/14/22  Yes Pabon, Diego F, MD  Cholecalciferol (VITAMIN D) 50 MCG (2000 UT) CAPS Take 2,000 Units by mouth daily.   Yes [provider]  flecainide (TAMBOCOR) 100 MG tablet Take 100 mg by mouth 2 (two) times daily. 03/17/22  Yes [provider]  glipiZIDE (GLUCOTROL XL) 10 MG 24 hr tablet Take 10 mg by mouth 2 (two) times daily.    Yes [provider]  metFORMIN (GLUCOPHAGE-XR) 500 MG 24 hr tablet Take 500 mg by mouth daily with breakfast. 02/10/21  Yes [provider]  metoprolol (LOPRESSOR) 100 MG tablet Take 100 mg by mouth 2 (two) times daily.   Yes [provider]  metroNIDAZOLE (FLAGYL) 500 MG tablet Take 2 tablets at 8AM, take 2 tablets at [facility], and take 2 tablets at 8PM the day prior to your surgery 09/14/22  Yes Pabon, Diego F, MD  Multiple Vitamins-Minerals (MULTIVITAMIN WITH MINERALS) tablet Take 1 tablet by mouth daily with breakfast.    Yes [provider]  neomycin (MYCIFRADIN) 500 MG tablet Take 2 tablet at 8am, take 2 tablets at 2pm,  and take 2 tablets at 8pm the day prior to your surgery 09/14/22  Yes Pabon, Diego F, MD  Omega-3 Fatty Acids (FISH OIL) 1000 MG CAPS Take 1,000 mg by mouth daily.   Yes [provider]  OZEMPIC, 1 MG/DOSE, 4 MG/3ML SOPN Inject 1 mg into the skin every Wednesday. Patient stopped taking these medications 08/02/21  Yes [provider]  polyethylene glycol powder (MIRALAX) 17 GM/SCOOP powder Mix full container in 64 ounces of Gatorade or other clear liquid. NO Red 09/14/22  Yes Pabon, [place], MD  Turmeric 400 MG CAPS Take 400 mg by mouth daily.   Yes [provider]  vitamin B-12 (CYANOCOBALAMIN) 100 MCG tablet Take 500 mcg by mouth daily.   Yes [provider]  Vitamin E 400 units TABS Take 400 Units by mouth daily.   Yes [provider]  apixaban (ELIQUIS) 5 MG TABS tablet Take 5 mg by mouth 2 (two) times daily.    [provider]  Coenzyme Q10 (COQ10) 100 MG CAPS Take 100 mg by mouth daily.    [provider]     Critical care time: 60 minutes     Donell Beers, [place] Pager [phone] (please enter 7 digits) PCCM Consult Pager [phone] (please enter 7 digits)

## 2022-09-28 NOTE — Progress Notes (Signed)
Initial Nutrition Assessment  DOCUMENTATION CODES:   Obesity unspecified  INTERVENTION:   -RD to follow for diet advancement and add supplements as appropriate -Pt has received minimal nutrition this hospitalization; if no plans for imminent diet advancement, may need to consider nutrition support  NUTRITION DIAGNOSIS:   Increased nutrient needs related to post-op healing as evidenced by estimated needs.  GOAL:   Patient will meet greater than or equal to 90% of their needs  MONITOR:   PO intake, Supplement acceptance, Diet advancement  REASON FOR ASSESSMENT:   Rounds    ASSESSMENT:   Pt with hx of recently diagnosed adenocarcinoma of the colon who is currently admitted status post right colectomy, diabetes, hypertension, and A-fib status post multiple ablations currently on metoprolol and flecainide.  10/16- s/p open colectomy with ileocolic anastomosis, open cholecystectomy 10/22- transferred to ICU due to high heart rate, low blood pressure, started on amiodarone  Reviewed I/O's: -1.2 L x 24 hours and +4.2 L since admission  UOP: 1.2 L x 24 hours  Pt seen at request of Food and Marketing executive. Pt has been on a clear liquid diet and bariatric full liquid diet and requesting RD investigate necessity of this diet.   Pt unavailable at time of visit. Pt down in OR at time of visit. RD unable to obtain further nutrition-related history or complete nutrition-focused physical exam at this time.    Per general surgery notes, plan for exploration and possible diverting ostomy today. Pt also with small microscopic leak. Pt currently NPO.   Reviewed wt hx; pt has experienced a 10.9% wt loss over the past 6 months, which is significant for time frame.   Medications reviewed and include amiodarone.   Lab Results  Component Value Date   HGBA1C 9.8 (H) 09/24/2022   PTA DM medications are 500 mg metformin daily.   Labs reviewed: Na: 128, CBGS: 81-93 (inpatient  orders for glycemic control are 0-15 units insulin aspart TID with meals, 0-5 units insulin aspart daily at bedtime, and 4 units insulin aspart TID with meals).    Diet Order:   Diet Order             Diet NPO time specified Except for: Ice Chips, Sips with Meds  Diet effective now                   EDUCATION NEEDS:   No education needs have been identified at this time  Skin:  Skin Assessment: Skin Integrity Issues: Skin Integrity Issues:: Incisions Incisions: closed abdomen  Last BM:  09/27/22  Height:   Ht Readings from Last 1 Encounters:  09/27/22 '6\' 2"'$  (1.88 m)    Weight:   Wt Readings from Last 1 Encounters:  09/27/22 118 kg    Ideal Body Weight:  86.4 kg  BMI:  Body mass index is 33.4 kg/m.  Estimated Nutritional Needs:   Kcal:  6389-3734  Protein:  130-145 grams  Fluid:  > 2 L    Loistine Chance, RD, LDN, [place] Registered Dietitian II Certified Diabetes Care and Education Specialist Please refer to [facility] for RD and/or RD on-call/weekend/after hours pager

## 2022-09-28 NOTE — Op Note (Signed)
PROCEDURES: Exploratory laparotomy and abdominal washout Placement of 19 blake drain Diverting loop ileostomy  Pre-operative Diagnosis: Anastomotic leak  Post-operative Diagnosis: intra-abdominal purulent fluid with significant inflammatory response  Surgeon: Marjory Lies Ruxin Ransome    Anesthesia: General endotracheal anesthesia  ASA Class: 3   Surgeon: Caroleen Hamman , MD FACS  Anesthesia: Gen. with endotracheal tube  Assistant: Adrienne Redford RNFA  Findings: RUQ with purulent fluid No obvious anastomotic leak but inflammatory reaction next to the anastomosis and small bowel. No evidence of large or small bowel injuries  Estimated Blood Loss: 50cc         Drains: 196 FR blake         Specimens: none          Complications: none                Procedure Details  The patient was seen again in the Holding Room. The benefits, complications, treatment options, and expected outcomes were discussed with the patient. The risks of bleeding, infection, recurrence of symptoms, failure to resolve symptoms,  bowel injury, any of which could require further surgery were reviewed with the patient.   The patient was taken to Operating Room, identified as Nicolas Zuniga and the procedure verified.  A Time Out was held and the above information confirmed.  Prior to the induction of general anesthesia, antibiotic prophylaxis was administered. VTE prophylaxis was in place. General endotracheal anesthesia was then administered and tolerated well. After the induction, the abdomen was prepped with Chloraprep and draped in the sterile fashion. The patient was positioned in the supine position.  The staples were removed and recent laparotomy was open after cutting the PDS sutures.  Sectioning reveals evidence of a significant amount of seropurulent fluid I will anticipate about 750 cc mainly in the right upper quadrant next to the anastomosis.  This was not bilious fluid and not feculent fluid.  It was  definitely consistent with infection. There was evidence of significant inflammatory reaction within the small bowel.  The abdomen was washed out and irrigated with warm saline with at least 6 L. I Inspected the anastomosis and could not find a leak. Run the bowel from the ligament of Treitz all the way to the rectum.  There was no evidence of any missed injuries. I also inspected the liver bed without any evidence of bile leaks. NG was confirming appropriate position Came back he may have a small leak that is healed versus an abscess to me next anastomosis.  In any case I felt that the safest thing was to do a loop ileostomy.  A 19 Blake drain was placed next anastomosis and to the gallbladder fossa. Created a defect within the right lower quadrant after his bleeding the rectus muscle.  I was able to deliver a piece of terminal ileum. Changed gloves and we infiltrated abdominal wall with Exparel.  The fascia was closed using 0 PDS with a small bite technique. I Decided to leave the skin open due to infection.  A Brooke loop ileostomy was matured using multiple interrupted 3-0 Vicryl's in the right lower quadrant and ostomy appliance was placed.  Please note that there was a concavity within the medial aspect that needed stoma paste to create an appropriate seal.  Needle and laparotomy count were correct and there were no immediate complications.  Caroleen Hamman, MD, FACS

## 2022-09-28 NOTE — Consult Note (Signed)
[place] for Heparin Infusion - NO BOLUS Indication: atrial fibrillation  No Known Allergies  Patient Measurements: Height: '6\' 2"'$  (188 cm) Weight: 118 kg (260 lb 2.3 oz) IBW/kg (Calculated) : 82.2 Heparin Dosing Weight: 107.3  Vital Signs: Temp: 98 F (36.7 C) (10/23 0400) Temp Source: Oral (10/23 0400) BP: 102/59 (10/23 0500) Pulse Rate: 138 (10/23 0500)  Labs: Recent Labs    09/26/22 2311 09/27/22 0332 09/27/22 1222 09/27/22 2028 09/28/22 0459  HGB 9.1* 9.3*  --   --  9.1*  HCT 30.0* 31.1*  --   --  30.1*  PLT 193 208  --   --  303  APTT  --   --  44*  --   --   LABPROT  --   --  22.1*  --   --   INR  --   --  2.0*  --   --   HEPARINUNFRC  --   --  <0.10* <0.10* 0.13*  CREATININE 1.45* 1.45*  --   --  1.86*     Estimated Creatinine Clearance: 53.3 mL/min (A) (by C-G formula based on SCr of 1.86 mg/dL (H)).   Medical History: Past Medical History:  Diagnosis Date   Adenocarcinoma of colon ([place]) 09/08/2022   Cholelithiasis    Chronic gastritis    Colon polyps    Coronary artery calcification seen on CT scan    Diastolic dysfunction    a.) TTE 04/09/2022: EF >55%, triv TR, G1DD   Diverticulosis    Hypertension    Long term current use of anticoagulant    a.) apixaban   Nephrolithiasis    Paroxysmal A-fib ([place])    a.) CHA2DS2VASc = 3 (age, HTN, T2DM);  b.) s/p DCCV 07/09/2016 (100 J x1), 12/22/2018 (120 J x1), 08/18/2021 (120 J x1), 03/11/2022 (120 J x1), 03/31/2022 (120 J x1); c.) rate/rhythm maintained on oral flecanide + metoprolol succinate; chronically anticoagulated with apixaban   T2DM (type 2 diabetes mellitus) (HCC)     Medications:  Scheduled:   acetaminophen  1,000 mg Oral Q6H   Chlorhexidine Gluconate Cloth  6 each Topical Daily   insulin aspart  0-15 Units Subcutaneous TID WC   insulin aspart  0-5 Units Subcutaneous QHS   insulin aspart  4 Units Subcutaneous TID WC   pantoprazole (PROTONIX) IV  40 mg  Intravenous QHS   sodium chloride flush  10-40 mL Intracatheter Q12H   Infusions:   amiodarone 30 mg/hr (09/27/22 2000)   diltiazem (CARDIZEM) infusion 15 mg/hr (09/28/22 0244)   heparin 1,950 Units/hr (09/28/22 0452)   lactated ringers 100 mL/hr at 09/27/22 2011   piperacillin-tazobactam (ZOSYN)  IV 3.375 g (09/28/22 0539)   PRN: diphenhydrAMINE **OR** diphenhydrAMINE, morphine injection, ondansetron **OR** ondansetron (ZOFRAN) IV, mouth rinse, oxyCODONE, prochlorperazine **OR** prochlorperazine, sodium chloride flush  Assessment: Nicolas Zuniga is a 66 y.o. male presenting for laparoscopic R colectomy and cholecystectomy. PMH significant for recently diagnosed adenocarcinoma of the colon, DM, HTN, Afib on metoprolol, flecainide, and apixaban. Patient was on [facility] PTA per chart review, last dose of apixaban PTA reported to be on 10/16. Last enoxaparin dose 10/20 AM. Pharmacy has been consulted to initiate and manage heparin infusion.   Baseline Labs: aPTT 44, PT 22.1, INR 2.0, Hgb 9.3, Hct 31.1, Plt 208   Goal of Therapy:  Heparin level 0.3-0.7 units/ml Monitor platelets by anticoagulation protocol: Yes   Plan:  No bolus per MD. Increase heparin infusion to 2250 units/hr Check  HL level in 6 hours and daily while on heparin Check CBC daily while on heparin  Thank you for allowing pharmacy to be a part of this patient's care.  Renda Rolls, PharmD, [facility] 09/28/2022 5:49 AM

## 2022-09-28 NOTE — Progress Notes (Signed)
1130 Back to OR via bed.

## 2022-09-28 NOTE — Progress Notes (Signed)
[place] NOTE       Patient ID: Nicolas Zuniga MRN: [id number] DOB/AGE: 1956/10/19 66 y.o.  Admit date: 09/24/2022 Referring Physician Sharion Settler, NP  Primary Physician Dr. Hortencia Pilar ([facility])  Primary Cardiologist Nehemiah Massed Reason for Consultation Af RVR  HPI: Nicolas Zuniga is a 78ION with a PMH of paroxysmal atrial fibrillation (on flecainide, metoprolol, and Eliquis), diabetes, hypertension, recently diagnosed adenocarcinoma of the colon who presented to [facility] 09/24/2022 for and elective laparoscopic right colectomy ultimately converted to open right colectomy and cholecystectomy.  Postoperative course was complicated by hypotension and bradycardia the evening of postop day 1, concerning for beta-blocker toxicity for which he was started on glucagon infusion.  Rapid response was called overnight on 10/21 for atrial fibrillation with RVR and questionable VT, was started on amiodarone and diltiazem infusions with some heart rate response.  Interval history: -Remains on amiodarone and diltiazem infusions, heparin infusion paused out of concern for anastomotic leak, general surgeon ordering a repeat CT abdomen and pelvis this morning, with plans to take the patient back to the OR this afternoon. -Remains in atrial fibrillation with RVR, rates in the 120s to 140s -He admits to shortness of breath, and can tell he is in atrial fibrillation.  Overall eager to treat his colon cancer so he can revisit an ablation eventually for his A-fib..  Review of systems complete and found to be negative unless listed above    Past Medical History:  Diagnosis Date   Adenocarcinoma of colon ([place]) 09/08/2022   Cholelithiasis    Chronic gastritis    Colon polyps    Coronary artery calcification seen on CT scan    Diastolic dysfunction    a.) TTE 04/09/2022: EF >55%, triv TR, G1DD   Diverticulosis    Hypertension    Long term current use of anticoagulant    a.)  apixaban   Nephrolithiasis    Paroxysmal A-fib ([place])    a.) CHA2DS2VASc = 3 (age, HTN, T2DM);  b.) s/p DCCV 07/09/2016 (100 J x1), 12/22/2018 (120 J x1), 08/18/2021 (120 J x1), 03/11/2022 (120 J x1), 03/31/2022 (120 J x1); c.) rate/rhythm maintained on oral flecanide + metoprolol succinate; chronically anticoagulated with apixaban   T2DM (type 2 diabetes mellitus) ([place])     Past Surgical History:  Procedure Laterality Date   CARDIOVERSION N/A 12/22/2018   Procedure: CARDIOVERSION (CATH LAB);  Surgeon: Corey Skains, MD;  Location: [facility] ORS;  Service: Cardiovascular;  Laterality: N/A;   CARDIOVERSION N/A 08/18/2021   Procedure: CARDIOVERSION;  Surgeon: Corey Skains, MD;  Location: [facility] ORS;  Service: Cardiovascular;  Laterality: N/A;   CARDIOVERSION N/A 03/11/2022   Procedure: CARDIOVERSION;  Surgeon: Corey Skains, MD;  Location: [facility] ORS;  Service: Cardiovascular;  Laterality: N/A;   CARDIOVERSION N/A 03/31/2022   Procedure: CARDIOVERSION;  Surgeon: Corey Skains, MD;  Location: [facility] ORS;  Service: Cardiovascular;  Laterality: N/A;   CHOLECYSTECTOMY N/A 09/24/2022   Procedure: LAPAROSCOPIC CHOLECYSTECTOMY;  Surgeon: Jules Husbands, MD;  Location: [facility] ORS;  Service: General;  Laterality: N/A;   COLONOSCOPY WITH PROPOFOL N/A 09/08/2022   Procedure: COLONOSCOPY WITH PROPOFOL;  Surgeon: Lucilla Lame, MD;  Location: [facility] ENDOSCOPY;  Service: Endoscopy;  Laterality: N/A;   ELECTROPHYSIOLOGIC STUDY N/A 07/09/2016   Procedure: CARDIOVERSION;  Surgeon: Dionisio David, MD;  Location: [facility] ORS;  Service: Cardiovascular;  Laterality: N/A;   LAPAROSCOPIC RIGHT COLECTOMY N/A 09/24/2022   Procedure: LAPAROSCOPIC RIGHT COLECTOMY, RNFA to assist;  Surgeon: Jules Husbands, MD;  Location: [facility] ORS;  Service: General;  Laterality: N/A;   TEE WITHOUT CARDIOVERSION N/A 07/09/2016   Procedure: TRANSESOPHAGEAL ECHOCARDIOGRAM (TEE);  Surgeon: Dionisio David, MD;  Location: [facility] ORS;  Service:  Cardiovascular;  Laterality: N/A;   web fingers repaired     as a child    Medications Prior to Admission  Medication Sig Dispense Refill Last Dose   bisacodyl (DULCOLAX) 5 MG EC tablet Take all 4 tablets at 8 am the morning prior to your surgery. 4 tablet 0 09/23/2022   Cholecalciferol (VITAMIN D) 50 MCG (2000 UT) CAPS Take 2,000 Units by mouth daily.   Past Week   flecainide (TAMBOCOR) 100 MG tablet Take 100 mg by mouth 2 (two) times daily.   09/24/2022   glipiZIDE (GLUCOTROL XL) 10 MG 24 hr tablet Take 10 mg by mouth 2 (two) times daily.    09/24/2022   metFORMIN (GLUCOPHAGE-XR) 500 MG 24 hr tablet Take 500 mg by mouth daily with breakfast.   Past Month   metoprolol (LOPRESSOR) 100 MG tablet Take 100 mg by mouth 2 (two) times daily.   09/24/2022   metroNIDAZOLE (FLAGYL) 500 MG tablet Take 2 tablets at 8AM, take 2 tablets at [facility], and take 2 tablets at 8PM the day prior to your surgery 6 tablet 0 09/23/2022   Multiple Vitamins-Minerals (MULTIVITAMIN WITH MINERALS) tablet Take 1 tablet by mouth daily with breakfast.    Past Week   neomycin (MYCIFRADIN) 500 MG tablet Take 2 tablet at 8am, take 2 tablets at 2pm, and take 2 tablets at 8pm the day prior to your surgery 6 tablet 0 09/23/2022   Omega-3 Fatty Acids (FISH OIL) 1000 MG CAPS Take 1,000 mg by mouth daily.   Past Week   OZEMPIC, 1 MG/DOSE, 4 MG/3ML SOPN Inject 1 mg into the skin every Wednesday. Patient stopped taking these medications   Past Month   polyethylene glycol powder (MIRALAX) 17 GM/SCOOP powder Mix full container in 64 ounces of Gatorade or other clear liquid. NO Red 238 g 0 09/23/2022   Turmeric 400 MG CAPS Take 400 mg by mouth daily.   Past Week   vitamin B-12 (CYANOCOBALAMIN) 100 MCG tablet Take 500 mcg by mouth daily.   Past Week   Vitamin E 400 units TABS Take 400 Units by mouth daily.   Past Week   apixaban (ELIQUIS) 5 MG TABS tablet Take 5 mg by mouth 2 (two) times daily.   09/21/2022   Coenzyme Q10 (COQ10) 100 MG CAPS  Take 100 mg by mouth daily.   09/21/2022   Social History   Socioeconomic History   Marital status: Married    Spouse name: Not on file   Number of children: Not on file   Years of education: Not on file   Highest education level: Not on file  Occupational History   Not on file  Tobacco Use   Smoking status: Never    Passive exposure: Never   Smokeless tobacco: Never  Vaping Use   Vaping Use: Never used  Substance and Sexual Activity   Alcohol use: No   Drug use: No   Sexual activity: Not on file  Other Topics Concern   Not on file  Social History Narrative   Lives with wife, Nicolas Zuniga, with on pet, cat.   Social Determinants of Health   Financial Resource Strain: Not on file  Food Insecurity: No Food Insecurity (09/24/2022)   Hunger Vital Sign  Worried About Charity fundraiser in the Last Year: Never true    [place] in the Last Year: Never true  Transportation Needs: No Transportation Needs (09/24/2022)   PRAPARE - Hydrologist (Medical): No    Lack of Transportation (Non-Medical): No  Physical Activity: Not on file  Stress: Not on file  Social Connections: Not on file  Intimate Partner Violence: Not At Risk (09/24/2022)   Humiliation, Afraid, Rape, and Kick questionnaire    Fear of Current or Ex-Partner: No    Emotionally Abused: No    Physically Abused: No    Sexually Abused: No    Family History  Problem Relation Age of Onset   Varicose Veins Mother    Heart disease Mother    Diabetes Mother    COPD Father      Vitals:   09/28/22 0730 09/28/22 0800 09/28/22 0830 09/28/22 0900  BP: (!) 89/57 (!) 87/52 (!) 79/53 108/61  Pulse: (!) 158 (!) 143 (!) 110 (!) 164  Resp: (!) 28 (!) 36 (!) 30 (!) 36  Temp: 99.7 F (37.6 C)     TempSrc: Axillary     SpO2: 96% 96% 98% 96%  Weight:      Height:        PHYSICAL EXAM General: Pleasant middle-aged Caucasian male, well nourished, in no acute distress. HEENT:   Normocephalic and atraumatic. Neck:  No JVD.  Lungs: Somewhat short of breath appearing on 2 L by nasal cannula clear bilaterally to auscultation. No wheezes, crackles, rhonchi.  Heart: Tachycardic irregularly irregular rhythm. Normal S1 and S2 without gallops or murmurs.  Abdomen: Non-distended appearing with excess adiposity.  Msk: Normal strength and tone for age. Extremities: Warm and well perfused. No clubbing, cyanosis.  Trace peripheral and upper extremity edema.Marland Kitchen  SCDs Neuro: Alert and oriented X 3. Psych:  Answers questions appropriately.   Labs: Basic Metabolic Panel: Recent Labs    09/27/22 0332 09/28/22 0459  NA 133* 128*  K 4.3 4.0  CL 103 96*  CO2 18* 16*  GLUCOSE 84 204*  BUN 22 25*  CREATININE 1.45* 1.86*  CALCIUM 8.9 8.4*  MG 1.5* 1.9   Liver Function Tests: Recent Labs    09/26/22 0412 09/26/22 2311  AST 29 32  ALT 16 14  ALKPHOS 43 40  BILITOT 0.5 0.9  PROT 5.4* 6.0*  ALBUMIN 2.7* 2.9*   No results for input(s): "LIPASE", "AMYLASE" in the last 72 hours. CBC: Recent Labs    09/27/22 0332 09/28/22 0459  WBC 8.5 10.8*  HGB 9.3* 9.1*  HCT 31.1* 30.1*  MCV 79.3* 79.2*  PLT 208 303   Cardiac Enzymes: No results for input(s): "CKTOTAL", "CKMB", "CKMBINDEX", "TROPONINIHS" in the last 72 hours. BNP: No results for input(s): "BNP" in the last 72 hours. D-Dimer: No results for input(s): "DDIMER" in the last 72 hours. Hemoglobin A1C: No results for input(s): "HGBA1C" in the last 72 hours. Fasting Lipid Panel: No results for input(s): "CHOL", "HDL", "LDLCALC", "TRIG", "CHOLHDL", "LDLDIRECT" in the last 72 hours. Thyroid Function Tests: Recent Labs    09/27/22 0332  TSH 3.741   Anemia Panel: No results for input(s): "VITAMINB12", "FOLATE", "FERRITIN", "TIBC", "IRON", "RETICCTPCT" in the last 72 hours.   Radiology: [facility] Port 1 View  Result Date: 09/27/2022 CLINICAL DATA:  PICC line placement. EXAM: PORTABLE CHEST 1 VIEW COMPARISON:   Abdominal series 09/26/2022. CT abdomen and pelvis 09/27/2022 FINDINGS: Shallow inspiration with atelectasis in  the lung bases. Cardiac enlargement. No airspace disease or consolidation in the lungs. A right PICC line has been placed with tip over the cavoatrial junction. No pneumothorax. Pneumoperitoneum is demonstrated below the right hemidiaphragm as seen on prior studies, likely postoperative. IMPRESSION: 1. Right PICC line appears in satisfactory position. 2. Shallow inspiration with atelectasis in the lung bases. Cardiac enlargement. 3. Pneumoperitoneum as seen on prior studies, probably postoperative. Electronically Signed   By: Lucienne Capers M.D.   On: 09/27/2022 19:25   [place] EKG SITE RITE  Result Date: 09/27/2022 If Site Rite image not attached, placement could not be confirmed due to current cardiac rhythm.  CT ABDOMEN PELVIS W CONTRAST  Result Date: 09/27/2022 CLINICAL DATA:  66 year old male with RIGHT colectomy for adenocarcinoma and cholecystectomy performed on 09/24/2022. Hypotension and abdominal discomfort. EXAM: CT ABDOMEN AND PELVIS WITH CONTRAST TECHNIQUE: Multidetector CT imaging of the abdomen and pelvis was performed using the standard protocol following bolus administration of intravenous contrast. RADIATION DOSE REDUCTION: This exam was performed according to the departmental dose-optimization program which includes automated exposure control, adjustment of the mA and/or kV according to patient size and/or use of iterative reconstruction technique. CONTRAST:  124m OMNIPAQUE IOHEXOL 350 MG/ML SOLN COMPARISON:  09/17/2022 CT FINDINGS: Lower chest: Trace bilateral pleural effusions and bibasilar atelectasis noted. Hepatobiliary: The liver is unremarkable. The patient is status post cholecystectomy. There is no evidence of intrahepatic or extrahepatic biliary dilatation. Pancreas: A small amount of ill-defined fluid anterior to the pancreas is noted but the remainder of the pancreas  is unremarkable. Spleen: Unremarkable Adrenals/Urinary Tract: No significant abnormalities of the kidneys, adrenal glands or bladder noted. No evidence of hydronephrosis. Stomach/Bowel: RIGHT colectomy and ileo colonic anastomosis noted. There is a small amount of ill-defined high density fluid along the suture line (images 35-39: Series 4), which may represent a small amount of extravasated contrast/leak. There is a small amount of ascites within the abdomen and pelvis with small to moderate amount of pneumoperitoneum within the abdomen. There is no evidence of focal collection/defined abscess. There is no evidence of bowel obstruction. No definite bowel wall thickening is identified. Vascular/Lymphatic: Aortic atherosclerosis. No enlarged abdominal or pelvic lymph nodes. Reproductive: 2 low-density areas within the prostate gland are noted, measuring 1.7 cm on the LEFT and 1 cm on the RIGHT. Other: Ill-defined stranding within the abdomen and pelvis noted likely recent surgical changes. Musculoskeletal: No acute or suspicious bony abnormalities are identified. IMPRESSION: 1. Ill-defined slightly high density fluid along the ileocolic suture line, which may represent a small amount of extravasated contrast/leak. Small to moderate amount of pneumoperitoneum and small amount of ascites within the abdomen and pelvis, not unexpected given surgery but nonspecific. No evidence of focal collection/defined abscess. 2. Trace bilateral pleural effusions and bibasilar atelectasis. 3. 2 low-density areas within the prostate gland, nonspecific but correlate with PSA. 4.  Aortic Atherosclerosis (ICD10-I70.0). Critical Value/emergent results discussed by phone at the time of interpretation on 09/27/2022 at 1:50 pm to provider Nicolas Zuniga , who verbally acknowledged these results. Electronically Signed   By: JMargarette CanadaM.D.   On: 09/27/2022 13:51   CT Angio Chest Pulmonary Embolism (PE) W or WO Contrast  Result Date:  09/27/2022 CLINICAL DATA:  Shortness of breath. EXAM: CT ANGIOGRAPHY CHEST WITH CONTRAST TECHNIQUE: Multidetector CT imaging of the chest was performed using the standard protocol during bolus administration of intravenous contrast. Multiplanar CT image reconstructions and MIPs were obtained to evaluate the vascular anatomy. RADIATION DOSE REDUCTION: This  exam was performed according to the departmental dose-optimization program which includes automated exposure control, adjustment of the mA and/or kV according to patient size and/or use of iterative reconstruction technique. CONTRAST:  157m OMNIPAQUE IOHEXOL 350 MG/ML SOLN COMPARISON:  September 17, 2022. FINDINGS: Cardiovascular: Satisfactory opacification of the pulmonary arteries to the segmental level. No evidence of pulmonary embolism. Normal heart size. No pericardial effusion. Mediastinum/Nodes: No enlarged mediastinal, hilar, or axillary lymph nodes. Thyroid gland, trachea, and esophagus demonstrate no significant findings. Lungs/Pleura: No pneumothorax is noted. Minimal bilateral pleural effusions are noted with adjacent subsegmental atelectasis. Upper Abdomen: Pneumoperitoneum is noted in the visualized portion of upper abdomen consistent with the reported history of recent laparoscopic right colectomy and cholecystectomy. Musculoskeletal: No chest wall abnormality. No acute or significant osseous findings. Review of the MIP images confirms the above findings. IMPRESSION: No definite evidence of pulmonary embolus. Minimal bilateral pleural effusions are noted with adjacent subsegmental atelectasis. Pneumoperitoneum is noted in visualized portion of upper abdomen consistent with reported history of recent laparoscopic right colectomy and cholecystectomy. Electronically Signed   By: JMarijo ConceptionM.D.   On: 09/27/2022 13:38   DG ABD ACUTE 2+V W 1V CHEST  Result Date: 09/26/2022 CLINICAL DATA:  Ileus following open right colectomy on 09/24/2022 EXAM:  DG ABDOMEN ACUTE WITH 1 VIEW CHEST COMPARISON:  09/17/2022 FINDINGS: Gaseous distension of the stomach. Air is seen within the colon. No dilated loops of small bowel. Small-moderate volume pneumoperitoneum. Numerous surgical clips within the right hemiabdomen. Midline abdominal skin staples. Heart size within normal limits. Low lung volumes. Trace left pleural effusion with left basilar atelectasis. No pneumothorax. IMPRESSION: 1. Small-moderate volume pneumoperitoneum, likely related to recent surgery. 2. Gaseous distension of the stomach. No dilated loops of small bowel to suggest obstruction. Electronically Signed   By: NDavina PokeD.O.   On: 09/26/2022 10:57   CT Chest W Contrast  Result Date: 09/18/2022 CLINICAL DATA:  Colon cancer diagnosed on 09/08/2022. Elevated CEA. Staging evaluation. EXAM: CT CHEST, ABDOMEN, AND PELVIS WITH CONTRAST TECHNIQUE: Multidetector CT imaging of the chest, abdomen and pelvis was performed following the standard protocol during bolus administration of intravenous contrast. RADIATION DOSE REDUCTION: This exam was performed according to the departmental dose-optimization program which includes automated exposure control, adjustment of the mA and/or kV according to patient size and/or use of iterative reconstruction technique. CONTRAST:  10104mOMNIPAQUE IOHEXOL 300 MG/ML  SOLN COMPARISON:  Abdomen and pelvis CT dated 03/07/2020 FINDINGS: CT CHEST FINDINGS Cardiovascular: Minimal coronary artery calcification. Normal sized heart. Mediastinum/Nodes: No enlarged mediastinal, hilar, or axillary lymph nodes. Thyroid gland, trachea, and esophagus demonstrate no significant findings. Lungs/Pleura: Minimal linear atelectasis or scarring at the left lung base. 5 x 3 mm left lower lobe nodule with a mean diameter of 4 mm on image number 100/5. Taking differences in slice thickness into account, this demonstrates little if any change since 03/07/2020. There is also a small calcified  granuloma more superiorly in the left lower lobe on image number 77/5. On image number 97/5 and coronal image number 58/6, there is a 4 x 2 mm oval nodular density with a mean diameter of 3 mm. It is difficult determine if this is a lung nodule or prominent vessel branch point. No other lung nodules seen. No pleural fluid. Musculoskeletal: Thoracic and lower cervical spine degenerative changes. No evidence of bony metastatic disease. CT ABDOMEN PELVIS FINDINGS Hepatobiliary: Multiple small noncalcified gallstones in the dependent portion of the gallbladder measuring up 5 mm in  maximum diameter each. No gallbladder wall thickening or pericholecystic fluid. Unremarkable liver. No liver masses are seen. Pancreas: Unremarkable. No pancreatic ductal dilatation or surrounding inflammatory changes. Spleen: Normal in size without focal abnormality. Adrenals/Urinary Tract: Normal appearing adrenal glands. Small bilateral renal cysts. These do not need imaging follow-up. Poorly distended urinary bladder. No ureteral abnormalities seen. Stomach/Bowel: Interval concentric mass involving the right colon and ileocecal valve measuring 7.3 cm in length on coronal image number 88/6 with a maximum wall thickness of 1.7 cm on the same image. This mass is confluent with an immediately adjacent mass medially with central low density, measuring 3.8 x 3.0 cm on image number 92/3 and 4.1 cm in length on coronal image number 83/6. There are multiple adjacent lymph node medial to these masses. The largest has a short axis diameter of 2.4 cm on image number 86/3. Multiple descending and sigmoid colon diverticula. Mild wall thickening involving the terminal ileum at the ileocecal valve. The remainder of the small bowel is unremarkable. Normal-appearing appendix and stomach. Vascular/Lymphatic: Mild atheromatous arterial calcifications without aneurysm. Multiple enlarged mesenteric lymph nodes medial to the right colon mass, as described above.  No other enlarged lymph nodes seen. Reproductive: Prostate is unremarkable. Other: No abdominal wall hernia or abnormality. No abdominopelvic ascites. Musculoskeletal: Lumbar spine degenerative changes. Mild bilateral hip degenerative changes. No evidence of bony metastatic disease. IMPRESSION: 1. Interval concentric mass involving the right colon, ileocecal valve and distal terminal ileum, compatible with the patient's known colon carcinoma. No associated obstruction. 2. Adjacent mass with central low density, compatible with local extension of necrotic tumor or an enlarged, necrotic metastatic lymph node. 3. Multiple adjacent enlarged mesenteric lymph nodes medial to these masses, compatible with metastatic adenopathy. 4. Colonic diverticulosis. 5. Cholelithiasis. 6. 4 mm mean diameter left lower lobe nodule possible 3 mm mean diameter right middle lobe nodule. These are felt to have a low likelihood of representing metastatic disease and could be followed on subsequent examinations. Electronically Signed   By: Claudie Revering M.D.   On: 09/18/2022 17:04   CT Abdomen Pelvis W Contrast  Result Date: 09/18/2022 CLINICAL DATA:  Colon cancer diagnosed on 09/08/2022. Elevated CEA. Staging evaluation. EXAM: CT CHEST, ABDOMEN, AND PELVIS WITH CONTRAST TECHNIQUE: Multidetector CT imaging of the chest, abdomen and pelvis was performed following the standard protocol during bolus administration of intravenous contrast. RADIATION DOSE REDUCTION: This exam was performed according to the departmental dose-optimization program which includes automated exposure control, adjustment of the mA and/or kV according to patient size and/or use of iterative reconstruction technique. CONTRAST:  115m OMNIPAQUE IOHEXOL 300 MG/ML  SOLN COMPARISON:  Abdomen and pelvis CT dated 03/07/2020 FINDINGS: CT CHEST FINDINGS Cardiovascular: Minimal coronary artery calcification. Normal sized heart. Mediastinum/Nodes: No enlarged mediastinal, hilar,  or axillary lymph nodes. Thyroid gland, trachea, and esophagus demonstrate no significant findings. Lungs/Pleura: Minimal linear atelectasis or scarring at the left lung base. 5 x 3 mm left lower lobe nodule with a mean diameter of 4 mm on image number 100/5. Taking differences in slice thickness into account, this demonstrates little if any change since 03/07/2020. There is also a small calcified granuloma more superiorly in the left lower lobe on image number 77/5. On image number 97/5 and coronal image number 58/6, there is a 4 x 2 mm oval nodular density with a mean diameter of 3 mm. It is difficult determine if this is a lung nodule or prominent vessel branch point. No other lung nodules seen. No pleural fluid. Musculoskeletal:  Thoracic and lower cervical spine degenerative changes. No evidence of bony metastatic disease. CT ABDOMEN PELVIS FINDINGS Hepatobiliary: Multiple small noncalcified gallstones in the dependent portion of the gallbladder measuring up 5 mm in maximum diameter each. No gallbladder wall thickening or pericholecystic fluid. Unremarkable liver. No liver masses are seen. Pancreas: Unremarkable. No pancreatic ductal dilatation or surrounding inflammatory changes. Spleen: Normal in size without focal abnormality. Adrenals/Urinary Tract: Normal appearing adrenal glands. Small bilateral renal cysts. These do not need imaging follow-up. Poorly distended urinary bladder. No ureteral abnormalities seen. Stomach/Bowel: Interval concentric mass involving the right colon and ileocecal valve measuring 7.3 cm in length on coronal image number 88/6 with a maximum wall thickness of 1.7 cm on the same image. This mass is confluent with an immediately adjacent mass medially with central low density, measuring 3.8 x 3.0 cm on image number 92/3 and 4.1 cm in length on coronal image number 83/6. There are multiple adjacent lymph node medial to these masses. The largest has a short axis diameter of 2.4 cm on  image number 86/3. Multiple descending and sigmoid colon diverticula. Mild wall thickening involving the terminal ileum at the ileocecal valve. The remainder of the small bowel is unremarkable. Normal-appearing appendix and stomach. Vascular/Lymphatic: Mild atheromatous arterial calcifications without aneurysm. Multiple enlarged mesenteric lymph nodes medial to the right colon mass, as described above. No other enlarged lymph nodes seen. Reproductive: Prostate is unremarkable. Other: No abdominal wall hernia or abnormality. No abdominopelvic ascites. Musculoskeletal: Lumbar spine degenerative changes. Mild bilateral hip degenerative changes. No evidence of bony metastatic disease. IMPRESSION: 1. Interval concentric mass involving the right colon, ileocecal valve and distal terminal ileum, compatible with the patient's known colon carcinoma. No associated obstruction. 2. Adjacent mass with central low density, compatible with local extension of necrotic tumor or an enlarged, necrotic metastatic lymph node. 3. Multiple adjacent enlarged mesenteric lymph nodes medial to these masses, compatible with metastatic adenopathy. 4. Colonic diverticulosis. 5. Cholelithiasis. 6. 4 mm mean diameter left lower lobe nodule possible 3 mm mean diameter right middle lobe nodule. These are felt to have a low likelihood of representing metastatic disease and could be followed on subsequent examinations. Electronically Signed   By: Claudie Revering M.D.   On: 09/18/2022 17:04    ECHO 04/2022 EF >55%, G1 DD with normal LV and RV systolic function  TELEMETRY reviewed by me (LT) 09/28/2022 : A-fib with RVR rate 120s to 150s  EKG reviewed by me: 09/25/2022 sinus rhythm rate 60s  Data reviewed by me (LT) 09/28/2022: Surgery progress note, CBC, BMP, I's and O's, telemetry, vitals, last cardiology note  Principal Problem:   S/P right colectomy Active Problems:   Diabetes ([place])   Essential hypertension   Paroxysmal A-fib ([facility])   S/P  partial resection of colon   Hypotension   Hyponatremia   AKI (acute kidney injury) ([place])    ASSESSMENT AND PLAN:  KAMERAN MCNEESE is a 35yoM with a PMH of paroxysmal atrial fibrillation (on flecainide, metoprolol, and Eliquis), diabetes, hypertension, recently diagnosed adenocarcinoma of the colon who presented [facility] for and elective laparoscopic right colectomy ultimately converted to open right colectomy and cholecystectomy.  Postoperative course was complicated by hypotension and bradycardia the evening of postop day 1, concerning for beta-blocker toxicity for which he was started on glucagon infusion.  Rapid response was called overnight on 10/21 for atrial fibrillation with RVR and questionable VT, was started on amiodarone and diltiazem infusions with some heart rate response.  #Adenocarcinoma  of the colon s/p open R colectomy & CCY 10/19 -Per general surgery, will repeat CT abdomen pelvis today and likely take him back to the OR for exploration and diverting ostomy the afternoon of 10/23  #Paroxysmal atrial fibrillation with RVR Was previously on flecainide, metoprolol, and Eliquis which have all been held after rapid response on 10/21.  His heart rates have been difficult to control, he remains in A-fib in the 120s at rest with paroxysms to the 150s. -Discussed with surgery, prefer IV route of medications in anticipation of surgery the afternoon of 10/23 -Continue to treat causes of increased adrenergic tone including pain, infection, hypoxia -Monitor and replete electrolytes to maintain a K >4, mag >2 -Continue amiodarone infusion -Continue diltiazem infusion -Discussed with the patient that an ablation would not take place likely during this hospitalization, but can revisit this on an outpatient basis. -CHA2DS2-VASc 3 (age, htn, dm2).  Eliquis was held prior to his operation as above, was on a heparin infusion from 10/22 -the morning of 10/23, which has been paused in  anticipation of his procedure this afternoon -Favor restarting heparin while inpatient if hemoglobin remains stable, pending clinical course  This patient's plan of care was discussed and created with Dr. Nehemiah Massed and he is in agreement.  Signed: Tristan Schroeder , PA-C 09/28/2022, 9:23 AM [facility] Cardiology

## 2022-09-28 NOTE — Progress Notes (Signed)
[place] requested per secure chat to obtain Gastrografin from CT as soon as possible to prepare for CT and possibility of returning to surgery today. Gastrografin sent to unit from CT and given to patient at bedside. Patient verbalized understanding of administration.

## 2022-09-28 NOTE — Progress Notes (Signed)
Attending and Cardiology at bedside to discuss plan of care with patient. RN also present at this time. Patient verbalizes understanding of the possibility to return to surgery this afternoon pending CT results. Also verbalizes understanding of no changes to current rate control drips at this time. Patient consent in chart. Patient resting in bed with all needs met. Will continue to monitor closely.

## 2022-09-29 ENCOUNTER — Encounter: Payer: Self-pay | Admitting: Surgery

## 2022-09-29 ENCOUNTER — Inpatient Hospital Stay
Admission: RE | Admit: 2022-09-29 | Discharge: 2022-09-29 | Disposition: A | Discharge status: Home / Self Care | Payer: Medicare Other | Source: Home / Self Care | Attending: Critical Care Medicine | Admitting: Critical Care Medicine

## 2022-09-29 DIAGNOSIS — I4819 Other persistent atrial fibrillation: Secondary | ICD-10-CM | POA: Diagnosis not present

## 2022-09-29 DIAGNOSIS — C189 Malignant neoplasm of colon, unspecified: Secondary | ICD-10-CM | POA: Diagnosis not present

## 2022-09-29 DIAGNOSIS — N179 Acute kidney failure, unspecified: Secondary | ICD-10-CM | POA: Diagnosis not present

## 2022-09-29 DIAGNOSIS — E1110 Type 2 diabetes mellitus with ketoacidosis without coma: Secondary | ICD-10-CM

## 2022-09-29 DIAGNOSIS — Z9049 Acquired absence of other specified parts of digestive tract: Secondary | ICD-10-CM | POA: Diagnosis not present

## 2022-09-29 LAB — BLOOD GAS, VENOUS
Acid-base deficit: 8.5 mmol/L — ABNORMAL HIGH (ref 0.0–2.0)
Acid-base deficit: 10.7 mmol/L — ABNORMAL HIGH (ref 0.0–2.0)
Acid-base deficit: 7.1 mmol/L — ABNORMAL HIGH (ref 0.0–2.0)
Acid-base deficit: 5.4 mmol/L — ABNORMAL HIGH (ref 0.0–2.0)
Bicarbonate: 16.5 mmol/L — ABNORMAL LOW (ref 20.0–28.0)
Bicarbonate: 15.9 mmol/L — ABNORMAL LOW (ref 20.0–28.0)
Bicarbonate: 17.9 mmol/L — ABNORMAL LOW (ref 20.0–28.0)
Bicarbonate: 20 mmol/L (ref 20.0–28.0)
O2 Saturation: 56.5 %
O2 Saturation: 66.1 %
O2 Saturation: 56.3 %
O2 Saturation: 55.5 %
Patient temperature: 37
Patient temperature: 37
Patient temperature: 37
Patient temperature: 37
pCO2, Ven: 32 mmHg — ABNORMAL LOW (ref 44–60)
pCO2, Ven: 37 mmHg — ABNORMAL LOW (ref 44–60)
pCO2, Ven: 34 mmHg — ABNORMAL LOW (ref 44–60)
pCO2, Ven: 38 mmHg — ABNORMAL LOW (ref 44–60)
pH, Ven: 7.32 (ref 7.25–7.43)
pH, Ven: 7.24 — ABNORMAL LOW (ref 7.25–7.43)
pH, Ven: 7.33 (ref 7.25–7.43)
pH, Ven: 7.33 (ref 7.25–7.43)
pO2, Ven: 41 mmHg (ref 32–45)
pO2, Ven: 47 mmHg — ABNORMAL HIGH (ref 32–45)
pO2, Ven: 41 mmHg (ref 32–45)
pO2, Ven: 41 mmHg (ref 32–45)

## 2022-09-29 LAB — ECHOCARDIOGRAM COMPLETE
AR max vel: 2.22 cm2
AV Area VTI: 3.5 cm2
AV Area mean vel: 2.37 cm2
AV Mean grad: 2 mmHg
AV Peak grad: 3.4 mmHg
Ao pk vel: 0.92 m/s
Area-P 1/2: 3.4 cm2
Height: 74 in
S' Lateral: 3.3 cm
Weight: 4162.28 oz

## 2022-09-29 LAB — BETA-HYDROXYBUTYRIC ACID
Beta-Hydroxybutyric Acid: 3.84 mmol/L — ABNORMAL HIGH (ref 0.05–0.27)
Beta-Hydroxybutyric Acid: 4.95 mmol/L — ABNORMAL HIGH (ref 0.05–0.27)
Beta-Hydroxybutyric Acid: 3.32 mmol/L — ABNORMAL HIGH (ref 0.05–0.27)

## 2022-09-29 LAB — CBC WITH DIFFERENTIAL/PLATELET
Abs Immature Granulocytes: 0.35 10*3/uL — ABNORMAL HIGH (ref 0.00–0.07)
Basophils Absolute: 0.1 10*3/uL (ref 0.0–0.1)
Basophils Relative: 1 %
Eosinophils Absolute: 0 10*3/uL (ref 0.0–0.5)
Eosinophils Relative: 0 %
HCT: 25.4 % — ABNORMAL LOW (ref 39.0–52.0)
Hemoglobin: 7.6 g/dL — ABNORMAL LOW (ref 13.0–17.0)
Immature Granulocytes: 3 %
Lymphocytes Relative: 9 %
Lymphs Abs: 1.2 10*3/uL (ref 0.7–4.0)
MCH: 23.6 pg — ABNORMAL LOW (ref 26.0–34.0)
MCHC: 29.9 g/dL — ABNORMAL LOW (ref 30.0–36.0)
MCV: 78.9 fL — ABNORMAL LOW (ref 80.0–100.0)
Monocytes Absolute: 2.7 10*3/uL — ABNORMAL HIGH (ref 0.1–1.0)
Monocytes Relative: 20 %
Neutro Abs: 9 10*3/uL — ABNORMAL HIGH (ref 1.7–7.7)
Neutrophils Relative %: 67 %
Platelets: 254 10*3/uL (ref 150–400)
RBC: 3.22 MIL/uL — ABNORMAL LOW (ref 4.22–5.81)
RDW: 15.1 % (ref 11.5–15.5)
Smear Review: NORMAL
WBC: 13.4 10*3/uL — ABNORMAL HIGH (ref 4.0–10.5)
nRBC: 0.9 % — ABNORMAL HIGH (ref 0.0–0.2)

## 2022-09-29 LAB — BLOOD GAS, ARTERIAL
Acid-base deficit: 8.1 mmol/L — ABNORMAL HIGH (ref 0.0–2.0)
Bicarbonate: 15.1 mmol/L — ABNORMAL LOW (ref 20.0–28.0)
FIO2: 40 %
MECHVT: 600 mL
Mechanical Rate: 24
O2 Saturation: 97.8 %
PEEP: 5 cmH2O
Patient temperature: 37
pCO2 arterial: 25 mmHg — ABNORMAL LOW (ref 32–48)
pH, Arterial: 7.39 (ref 7.35–7.45)
pO2, Arterial: 89 mmHg (ref 83–108)

## 2022-09-29 LAB — BASIC METABOLIC PANEL
Anion gap: 20 — ABNORMAL HIGH (ref 5–15)
Anion gap: 16 — ABNORMAL HIGH (ref 5–15)
Anion gap: 16 — ABNORMAL HIGH (ref 5–15)
Anion gap: 13 (ref 5–15)
Anion gap: 10 (ref 5–15)
BUN: 29 mg/dL — ABNORMAL HIGH (ref 8–23)
BUN: 35 mg/dL — ABNORMAL HIGH (ref 8–23)
BUN: 36 mg/dL — ABNORMAL HIGH (ref 8–23)
BUN: 40 mg/dL — ABNORMAL HIGH (ref 8–23)
BUN: 44 mg/dL — ABNORMAL HIGH (ref 8–23)
CO2: 16 mmol/L — ABNORMAL LOW (ref 22–32)
CO2: 17 mmol/L — ABNORMAL LOW (ref 22–32)
CO2: 17 mmol/L — ABNORMAL LOW (ref 22–32)
CO2: 19 mmol/L — ABNORMAL LOW (ref 22–32)
CO2: 21 mmol/L — ABNORMAL LOW (ref 22–32)
Calcium: 7.5 mg/dL — ABNORMAL LOW (ref 8.9–10.3)
Calcium: 8.1 mg/dL — ABNORMAL LOW (ref 8.9–10.3)
Calcium: 7.8 mg/dL — ABNORMAL LOW (ref 8.9–10.3)
Calcium: 8.7 mg/dL — ABNORMAL LOW (ref 8.9–10.3)
Calcium: 8.3 mg/dL — ABNORMAL LOW (ref 8.9–10.3)
Chloride: 94 mmol/L — ABNORMAL LOW (ref 98–111)
Chloride: 97 mmol/L — ABNORMAL LOW (ref 98–111)
Chloride: 93 mmol/L — ABNORMAL LOW (ref 98–111)
Chloride: 98 mmol/L (ref 98–111)
Chloride: 97 mmol/L — ABNORMAL LOW (ref 98–111)
Creatinine, Ser: 2.97 mg/dL — ABNORMAL HIGH (ref 0.61–1.24)
Creatinine, Ser: 3.76 mg/dL — ABNORMAL HIGH (ref 0.61–1.24)
Creatinine, Ser: 3.59 mg/dL — ABNORMAL HIGH (ref 0.61–1.24)
Creatinine, Ser: 4.12 mg/dL — ABNORMAL HIGH (ref 0.61–1.24)
Creatinine, Ser: 4.33 mg/dL — ABNORMAL HIGH (ref 0.61–1.24)
GFR, Estimated: 22 mL/min — ABNORMAL LOW (ref >60)
GFR, Estimated: 17 mL/min — ABNORMAL LOW (ref >60)
GFR, Estimated: 18 mL/min — ABNORMAL LOW (ref >60)
GFR, Estimated: 15 mL/min — ABNORMAL LOW (ref >60)
GFR, Estimated: 14 mL/min — ABNORMAL LOW (ref >60)
Glucose, Bld: 324 mg/dL — ABNORMAL HIGH (ref 70–99)
Glucose, Bld: 256 mg/dL — ABNORMAL HIGH (ref 70–99)
Glucose, Bld: 190 mg/dL — ABNORMAL HIGH (ref 70–99)
Glucose, Bld: 153 mg/dL — ABNORMAL HIGH (ref 70–99)
Glucose, Bld: 191 mg/dL — ABNORMAL HIGH (ref 70–99)
Potassium: 4.1 mmol/L (ref 3.5–5.1)
Potassium: 3.4 mmol/L — ABNORMAL LOW (ref 3.5–5.1)
Potassium: 3.3 mmol/L — ABNORMAL LOW (ref 3.5–5.1)
Potassium: 3.8 mmol/L (ref 3.5–5.1)
Potassium: 3.6 mmol/L (ref 3.5–5.1)
Sodium: 130 mmol/L — ABNORMAL LOW (ref 135–145)
Sodium: 130 mmol/L — ABNORMAL LOW (ref 135–145)
Sodium: 126 mmol/L — ABNORMAL LOW (ref 135–145)
Sodium: 130 mmol/L — ABNORMAL LOW (ref 135–145)
Sodium: 128 mmol/L — ABNORMAL LOW (ref 135–145)

## 2022-09-29 LAB — GLUCOSE, CAPILLARY
Glucose-Capillary: 131 mg/dL — ABNORMAL HIGH (ref 70–99)
Glucose-Capillary: 144 mg/dL — ABNORMAL HIGH (ref 70–99)
Glucose-Capillary: 160 mg/dL — ABNORMAL HIGH (ref 70–99)
Glucose-Capillary: 165 mg/dL — ABNORMAL HIGH (ref 70–99)
Glucose-Capillary: 166 mg/dL — ABNORMAL HIGH (ref 70–99)
Glucose-Capillary: 168 mg/dL — ABNORMAL HIGH (ref 70–99)
Glucose-Capillary: 170 mg/dL — ABNORMAL HIGH (ref 70–99)
Glucose-Capillary: 173 mg/dL — ABNORMAL HIGH (ref 70–99)
Glucose-Capillary: 176 mg/dL — ABNORMAL HIGH (ref 70–99)
Glucose-Capillary: 180 mg/dL — ABNORMAL HIGH (ref 70–99)
Glucose-Capillary: 182 mg/dL — ABNORMAL HIGH (ref 70–99)
Glucose-Capillary: 192 mg/dL — ABNORMAL HIGH (ref 70–99)
Glucose-Capillary: 222 mg/dL — ABNORMAL HIGH (ref 70–99)
Glucose-Capillary: 232 mg/dL — ABNORMAL HIGH (ref 70–99)
Glucose-Capillary: 264 mg/dL — ABNORMAL HIGH (ref 70–99)
Glucose-Capillary: 283 mg/dL — ABNORMAL HIGH (ref 70–99)

## 2022-09-29 LAB — PROCALCITONIN: Procalcitonin: 18.25 ng/mL

## 2022-09-29 LAB — PHOSPHORUS: Phosphorus: 3.1 mg/dL (ref 2.5–4.6)

## 2022-09-29 LAB — BRAIN NATRIURETIC PEPTIDE: B Natriuretic Peptide: 476.6 pg/mL — ABNORMAL HIGH (ref 0.0–100.0)

## 2022-09-29 LAB — MAGNESIUM: Magnesium: 1.8 mg/dL (ref 1.7–2.4)

## 2022-09-29 LAB — TROPONIN I (HIGH SENSITIVITY): Troponin I (High Sensitivity): 70 ng/L — ABNORMAL HIGH (ref <18)

## 2022-09-29 LAB — LACTIC ACID, PLASMA: Lactic Acid, Venous: 2.7 mmol/L (ref 0.5–1.9)

## 2022-09-29 MED ORDER — LACTATED RINGERS IV BOLUS: 20.0000 mL/kg | Freq: Once | INTRAVENOUS | Status: DC

## 2022-09-29 MED ORDER — LACTATED RINGERS IV BOLUS
500.0000 mL | Freq: Once | INTRAVENOUS | Status: AC
  Administered 2022-09-29: 500 mL via INTRAVENOUS

## 2022-09-29 MED ORDER — DEXMEDETOMIDINE HCL IN NACL 400 MCG/100ML IV SOLN
0.3000 ug/kg/h | INTRAVENOUS | Status: DC
  Administered 2022-09-29: 0.3 ug/kg/h via INTRAVENOUS
  Filled 2022-09-29: qty 100

## 2022-09-29 MED ORDER — PROPOFOL 1000 MG/100ML IV EMUL: 5.0000 ug/kg/min | INTRAVENOUS | Status: DC

## 2022-09-29 MED ORDER — LACTATED RINGERS IV SOLN: INTRAVENOUS | Status: DC

## 2022-09-29 MED ORDER — INSULIN REGULAR(HUMAN) IN NACL 100-0.9 UT/100ML-% IV SOLN
INTRAVENOUS | Status: DC
  Administered 2022-09-29: 10 [IU]/h via INTRAVENOUS
  Administered 2022-09-30: 5.5 [IU]/h via INTRAVENOUS
  Filled 2022-09-29 (×2): qty 100

## 2022-09-29 MED ORDER — MAGNESIUM SULFATE 2 GM/50ML IV SOLN
2.0000 g | Freq: Once | INTRAVENOUS | Status: AC
  Administered 2022-09-29: 2 g via INTRAVENOUS
  Filled 2022-09-29: qty 50

## 2022-09-29 MED ORDER — DEXTROSE 50 % IV SOLN
0.0000 mL | INTRAVENOUS | Status: DC | PRN
  Filled 2022-09-29: qty 50

## 2022-09-29 MED ORDER — POTASSIUM CHLORIDE 10 MEQ/50ML IV SOLN
10.0000 meq | INTRAVENOUS | Status: DC
  Administered 2022-09-29: 10 meq via INTRAVENOUS
  Filled 2022-09-29 (×2): qty 50

## 2022-09-29 MED ORDER — DEXTROSE IN LACTATED RINGERS 5 % IV SOLN
INTRAVENOUS | Status: DC
  Administered 2022-09-29: 125 mL/h via INTRAVENOUS

## 2022-09-29 MED ORDER — POTASSIUM CHLORIDE 10 MEQ/100ML IV SOLN: 10.0000 meq | INTRAVENOUS | Status: DC

## 2022-09-29 MED ORDER — HEPARIN (PORCINE) 25000 UT/250ML-% IV SOLN
3400.0000 [IU]/h | INTRAVENOUS | Status: DC
  Administered 2022-09-29: 1500 [IU]/h via INTRAVENOUS
  Administered 2022-09-30: 1700 [IU]/h via INTRAVENOUS
  Administered 2022-09-30: 1950 [IU]/h via INTRAVENOUS
  Administered 2022-10-02: 2600 [IU]/h via INTRAVENOUS
  Administered 2022-10-02: 2800 [IU]/h via INTRAVENOUS
  Administered 2022-10-02: 2700 [IU]/h via INTRAVENOUS
  Administered 2022-10-03 (×3): 2800 [IU]/h via INTRAVENOUS
  Administered 2022-10-04: 3200 [IU]/h via INTRAVENOUS
  Administered 2022-10-04: 3000 [IU]/h via INTRAVENOUS
  Filled 2022-09-29 (×12): qty 250

## 2022-09-29 MED ORDER — POTASSIUM CHLORIDE 10 MEQ/50ML IV SOLN
10.0000 meq | INTRAVENOUS | Status: AC
  Administered 2022-09-29 (×2): 10 meq via INTRAVENOUS
  Filled 2022-09-29 (×3): qty 50

## 2022-09-29 MED ORDER — TRACE MINERALS CU-MN-SE-ZN 300-55-60-3000 MCG/ML IV SOLN
INTRAVENOUS | Status: AC
  Filled 2022-09-29: qty 378.73

## 2022-09-29 NOTE — Anesthesia Postprocedure Evaluation (Signed)
Anesthesia Post Note  Patient: Nicolas Zuniga  Procedure(s) Performed: [facility]  Patient location during evaluation: SICU Anesthesia Type: General Level of consciousness: awake and alert Pain management: pain level controlled Vital Signs Assessment: post-procedure vital signs reviewed and stable Respiratory status: patient remains intubated per anesthesia plan and patient on ventilator - see flowsheet for VS Cardiovascular status: stable Postop Assessment: no apparent nausea or vomiting Anesthetic complications: no   No notable events documented.   Last Vitals:  Vitals:   09/29/22 0700 09/29/22 0715  BP: 110/74 126/74  Pulse: 67 83  Resp: (!) 24 (!) 26  Temp:    SpO2: 96% 96%    Last Pain:  Vitals:   09/28/22 2000  TempSrc: Axillary  PainSc:                  Hedda Slade

## 2022-09-29 NOTE — Progress Notes (Signed)
*  PRELIMINARY RESULTS* Echocardiogram 2D Echocardiogram has been performed.  Sherrie Sport 09/29/2022, 8:34 AM

## 2022-09-29 NOTE — Progress Notes (Signed)
PHARMACY - TOTAL PARENTERAL NUTRITION CONSULT NOTE   Indication: takeback yesterday for anastomosic leak, intra-abdominal infection, will likely have post-op ileus  Patient Measurements: Height: '6\' 2"'$  (188 cm) Weight: 118 kg (260 lb 2.3 oz) IBW/kg (Calculated) : 82.2 TPN AdjBW (KG): 91.1 Body mass index is 33.4 kg/m.  Assessment: Pt with hx of recently diagnosed adenocarcinoma of the colon who is currently admitted status post right colectomy, diabetes, hypertension, and A-fib status post multiple ablations  Glucose / Insulin: insulin infusion Electrolytes: hyponatremic Renal: SCR 1.93-->1.45-->3.59 Hepatic: AST, ALT wnl Intake / Output; +4.2 L since admission MIVF: none GI Imaging: GI Surgeries / Procedures:   Central access: 09/24/22 TPN start date: 09/29/22  Nutritional Goals: Goal TPN rate is 95 mL/hr (provides 179.9 g of protein and 1648 kcals per day)  RD Assessment: Estimated Needs Total Energy Estimated Needs: 2550-2750 Total Protein Estimated Needs: 130-145 grams Total Fluid Estimated Needs: > 2 L  Current Nutrition:  NPO  Plan:  Start TPN at 36m/hr at 1800 Nutritional components: Amino acids (using 15% Clinisol): 56.8 grams Dextrose: 40.3 grams  Lipids (using 20% SMOFlipids): 15.6 grams kCal: 520.5/24h Electrolytes in TPN (standard): Na 5109m/L, K 5068mL, Ca 5mE67m, Mg 5mEq71m and Phos 15mmo8m Cl:Ac 1:1 Continue IV insulin infusion Monitor TPN labs on Mon/Thurs, daily until stable  [name]/2023,8:02 AM

## 2022-09-29 NOTE — Progress Notes (Addendum)
Inpatient Diabetes Program Recommendations  AACE/ADA: New Consensus Statement on Inpatient Glycemic Control   Target Ranges:  Prepandial:   less than 140 mg/dL      Peak postprandial:   less than 180 mg/dL (1-2 hours)      Critically ill patients:  140 - 180 mg/dL    Latest Reference Range & Units 09/29/22 03:10 09/29/22 08:01  Glucose-Capillary 70 - 99 mg/dL 192 (H) 283 (H)    Latest Reference Range & Units 09/28/22 07:45 09/28/22 14:34 09/28/22 17:15 09/28/22 19:38 09/28/22 23:21  Glucose-Capillary 70 - 99 mg/dL 93 123 (H) 158 (H) 163 (H) 186 (H)    Latest Reference Range & Units 09/29/22 01:06 09/29/22 10:41  Beta-Hydroxybutyric Acid 0.05 - 0.27 mmol/L 3.84 (H) 4.95 (H)     Latest Reference Range & Units 09/29/22 01:06 09/29/22 10:41 09/29/22 11:40  CO2 22 - 32 mmol/L 16 (L) 17 (L) 17 (L)  Glucose 70 - 99 mg/dL 324 (H) 256 (H) 190 (H)  Anion gap 5 - 15  20 (H) 16 (H) 16 (H)    Latest Reference Range & Units 09/29/22 01:06  CO2 22 - 32 mmol/L 16 (L)  Glucose 70 - 99 mg/dL 324 (H)  Anion gap 5 - 15  20 (H)    Latest Reference Range & Units 09/29/22 01:06  Lactic Acid, Venous 0.5 - 1.9 mmol/L 2.7 (HH)   Review of Glycemic Control  Diabetes history: DM2 Outpatient Diabetes medications: Glipizide XL 10 mg BID, Metformin 500 mg QAM, Ozempic 1 mg Qweek (Wednesday) Current orders for Inpatient glycemic control: IV insulin  Inpatient Diabetes Program Recommendations:    Insulin:  IV insulin ordered this morning per DKA order set. IV insulin should be continued until acidosis has resolved completely.  Once acidosis is resolved, may want to continue on IV insulin (using Hyperglycemia mode on EndoTool) to help determine insulin requirements especially since TPN will be started this evening.  NOTE: Glucose 192 and 283 mg/dl today and labs from today indicate DKA. Noted patient will be starting on TPN today and has been ordered IV insulin for DKA this morning.  Will follow glucose  trends and insulin requirements and make further recommendations when appropriate.   Thanks, Barnie Alderman, RN, MSN, [place] [facility] Coordinator Inpatient Diabetes Program [phone] (Team Pager from 8am to [place])

## 2022-09-29 NOTE — Progress Notes (Addendum)
NAME:  Nicolas Zuniga, MRN:  [id number], DOB:  1956/04/26, LOS: 5 ADMISSION DATE:  09/24/2022, CONSULTATION DATE: 09/28/22 REFERRING MD: Dr. Dahlia Byes   History of Present Illness:  This is a 66 yo male with right colon cancer and cholelithiasis who presented to [facility] on 10/19 for laparoscopic right colectomy and cholecystectomy.  Surgical findings were right colon mass with evidence of bulky mesenteric nodal involvement; large right colon mass adhered to the retroperitoneum; mesenteric nodes within terminal ileum; tension free anastomosis, no evidence of intraop leak and good perfusion; and chronic calculus cholecystitis.  Postop pt admitted to the medsurg unit per general surgery for additional workup and treatment.  See detailed hospital course under significant events. 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 Small to moderate amount of pneumoperitoneum and small amount of ascites within the abdomen and pelvis, not unexpected given surgery but  nonspecific. No evidence of focal collection/defined abscess. Trace bilateral pleural effusions and bibasilar atelectasis. 2 low-density areas within the prostate gland, nonspecific but correlate with PSA. Aortic Atherosclerosis (ICD10-I70.0). 10/22: CTA Chest no definite evidence of pulmonary embolus. Minimal bilateral pleural effusions are noted with adjacent subsegmental atelectasis. Pneumoperitoneum is noted in visualized portion of upper abdomen consistent with reported history of recent laparoscopic right colectomy and cholecystectomy. 10/23:  CT Abd Pelvis while there is no evidence for gross enteric contrast extravasation at the level of the ileocolic anastomosis, the small fluid collection adjacent to the anastomosis identified on yesterday's study has increased in attenuation in the interval. This increase in attenuation is concerning for some trace spill of contrast from the anastomosis into the collection. Slight increase in fluid volume in the right paracolic gutter with similar fluid volume around the liver and in the left paracolic gutter. Collections of interloop mesenteric fluid are not substantially changed. Free gas in the peritoneal cavity is not unexpected 4 days out from surgery. Gas in the midline subcutaneous tissues deep to the staple line has increased in the interval. Contrast in the kidneys compatible with residual from yesterday's infused scan. Bibasilar atelectasis with tiny bilateral pleural effusions. Aortic Atherosclerosis (ICD10-I70.0).  Significant Hospital Events: Including procedures, antibiotic start and stop dates in addition to other pertinent events   10/19: Pt admitted to the [facility] unit per general surgery post laparoscopic right colectomy and cholecystectomy 10/20: Pt became hypotensive and bradycardic suspected secondary to beta blocker/antiarrythmic medication pt received glucagon and iv fluid bolus.  Hospitalist team consulted to assist with management all beta blockers and flecainide discontinued  10/22: Pt developed atrial fibrillation with rvr, acute respiratory failure, and hypotension with questionable episode of Vtach, rapid response initiated.  Pt required amiodarone bolus followed by drip and transfer to the stepdown unit.  Rate remained uncontrolled and cardizem gtt added _0  mg/hr in addition to amiodarone gtt.  Cardiology consulted   10/22: CT Abd Pelvis concerning for possible small leak but no contrast extravasation.  General Surgery recommended- antibiotic therapy; repeat scan in  24-48hrs; and keep NPO except meds  10/23: Revealed no evidence of gross enteric contrast extravasation but increased fluid collection adjacent to the anastomosis and slight increase in fluid volume in the right  paracolic gutter.  Pt taken back to the OR for exploratory laparotomy with findings of no obvious anastomotic leak or evidence of large or small bowel injuries but intra-abdominal purulent fluid with significant inflammatory response present. Abdominal washout with placement of JP drain and diverting loop colostomy. Pt remained mechanically intubated.  PCCM        team consulted  10/24: Pt with worsening acute renal failure Nephrology consulted.  Pt with bradycardia overnight cardizem and precedex gtts discontinued.  Pt remains on amiodarone gtt at 30 mg/hr  Pt alert and following commands plans for possible SBT today  Interim History / Subjective:  As outlined above   Objective   Blood pressure 99/60, pulse 82, temperature 99.5 F (37.5 C), temperature source Axillary, resp. rate (!) 25, height _0  (1.88 m), weight 118 kg, SpO2 98 %.    Vent Mode: PRVC FiO2 (%):  [40 %] 40 % Set Rate:  [16 bmp-24 bmp] 24 bmp Vt Set:  [550 mL-600 mL] 600 mL PEEP:  [5 cmH20] 5 cmH20 Plateau Pressure:  [20 cmH20] 20 cmH20   Intake/Output Summary (Last 24 hours) at 09/29/2022 0710 Last data filed at 09/29/2022 0300 Gross per 24 hour  Intake 3077.85 ml  Output 736 ml  Net 2341.85 ml   Filed Weights   09/24/22 1038 09/27/22 0148  Weight: 124.7 kg 118 kg   Examination: General: Acutely-ill appearing male, sedated and mechanically intubated  HENT: Supple, no JVD presented  Lungs: Clear diminished throughout, even, non labored  Cardiovascular: Irregular irregular, no r/g, 1+ radial/1+ distal pulses presented, 1+ generalized edema present  Abdomen: No audible BS present, non distended, midline abdominal incision with dressing dry/intact; JP drain present with sanguinous drainage; right quadrant  diverting loop colostomy  Extremities: Normal tone and bulk  Neuro: Alert and following commands, PERRL GU: Indwelling foley catheter in place draining tea colored urine   Resolved Hospital Problem list   Bradycardia   Assessment & Plan:  Acute respiratory failure secondary to severe metabolic acidosis  Mechanical intubation intraoperatively for airway protection  - Full vent support for now with target PRVC 8 cc/kg - Wean PEEP and FiO2 as able to maintain O2 sats >92% - Goal plateau pressure less than 30, driving pressure less than 15 - SBT once all parameters met  - Intermittent ABG and CXR  - Maintain RASS goal 0 to -1  - VAP prevention order set - WUA daily   Atrial fibrillation with rvr~improving   Hypotension secondary to hypovolemia and septic shock Hx: HTN and Chronic diastolic dysfunction  - Continuous telemetry monitoring  - Echo pending - Trend troponin until peaked  - Continue amiodarone, hold cardizem gtt due to hypotension and episodes of bradycardia  - Gentle iv fluid resuscitation; prn levophed and vasopressin gtts  to maintain map >65 - Hold outpatient antiarrhythmic and beta-blocker therapy for now due to hypotension  - Cardiology consulted appreciate input   Acute kidney injury secondary ATN~worsening  Hyponatremia  Anion gap metabolic acidosis  - Trend BMP  - Lactic acid pending  - Replace electrolytes as indicated  - Monitor UOP - Avoid nephrotoxic medications  - Nephrology consulted appreciate input: pt may require CRRT  Right colon cancer and cholelithiasis s/p laparoscopic right colectomy and cholecystectomy: 09/24/22 Intra-abdominal infection s/p exploratory laparotomy with abdominal washout and diverting loop ileostomy 10/23 - Trend WBC and monitor fever curve  - Trend PCT  - Follow cultures  - Continue zosyn  - General Surgery primary appreciate input  -  NGT LIS   Type II diabetes mellitus  - CBG's q4hrs  - SSI - Beta-hydroxybutyric acid  pending to assess for DKA and possible need for insulin gtt  - Target range 140-180   Best Practice (right click and "Reselect all SmartList Selections" daily)  Diet/type: NPO DVT prophylaxis: SCD GI prophylaxis: PPI Lines: Right upper extremity PICC and still needed  Foley:  Yes, and it is still needed Code Status:  full code Last date of multidisciplinary goals of care discussion [09/29/22]  Labs   CBC: Recent Labs  Lab 09/26/22 2311 09/27/22 0332 09/28/22 0459 09/28/22 1450 09/28/22 1807 09/29/22 0106  WBC 9.6 8.5 10.8* 14.2*  --  13.4*  NEUTROABS  --   --   --  9.3*  --  9.0*  HGB 9.1* 9.3* 9.1* 7.6* 7.3* 7.6*  HCT 30.0* 31.1* 30.1* 26.0* 25.1* 25.4*  MCV 79.2* 79.3* 79.2* 80.2  --  78.9*  PLT 193 208 303 259  --  941    Basic Metabolic Panel: Recent Labs  Lab 09/25/22 0613 09/25/22 2138 09/27/22 0332 09/28/22 0459 09/28/22 1449 09/28/22 1450 09/28/22 1807 09/29/22 0106  NA 131*   < > 133* 128* 126*  --  126* 130*  K 4.3   < > 4.3 4.0 3.9  --  4.0 4.1  CL 100   < > 103 96* 93*  --  96* 94*  CO2 17*   < > 18* 16* 16*  --  14* 16*  GLUCOSE 232*   < > 84 204* 250*  --  268* 324*  BUN 22   < > 22 25* 26*  --  27* 29*  CREATININE 1.23   < > 1.45* 1.86* 2.42*  --  2.71* 2.97*  CALCIUM 8.4*   < > 8.9 8.4* 8.2*  --  7.8* 7.5*  MG 1.7  --  1.5* 1.9  --   --   --  1.8  PHOS  --   --   --   --   --  4.4  --  3.1   < > = values in this interval not displayed.   GFR: Estimated Creatinine Clearance: 33.4 mL/min (A) (by C-G formula based on SCr of 2.97 mg/dL (H)). Recent Labs  Lab 09/27/22 0332 09/28/22 0459 09/28/22 1449 09/28/22 1450 09/28/22 1702 09/28/22 1945 09/29/22 0106  PROCALCITON  --   --  21.52  --   --   --  18.25  WBC 8.5 10.8*  --  14.2*  --   --  13.4*  LATICACIDVEN  --   --   --   --  5.7* 4.4* 2.7*    Liver Function Tests: Recent Labs  Lab 09/26/22 0412 09/26/22 2311 09/28/22 1449  AST 29 32 22  ALT _0 ALKPHOS 43 40 50   BILITOT 0.5 0.9 0.8  PROT 5.4* 6.0* 4.4*  ALBUMIN 2.7* 2.9* 2.2*   No results for input(s): "LIPASE", "AMYLASE" in the last 168 hours. No results for input(s): "AMMONIA" in the last 168 hours.  ABG    Component Value Date/Time   PHART 7.24 (L) 09/28/2022 1807   PCO2ART 32 09/28/2022 1807   PO2ART 75 (L) 09/28/2022 1807   HCO3 14.9 (L) 09/28/2022 1948   ACIDBASEDEF 11.3 (H) 09/28/2022 1948   O2SAT 62 09/28/2022 1948     Coagulation Profile: Recent Labs  Lab 09/27/22 1222 09/28/22 0458  INR 2.0* 2.1*    Cardiac Enzymes: No results for input(s): "  CKTOTAL", "CKMB", "CKMBINDEX", "TROPONINI" in the last 168 hours.  HbA1C: Hemoglobin A1C  Date/Time Value Ref Range Status  06/16/2012 10:17 AM 7.2 (H) 4.2 - 6.3 % Final    Comment:    The American Diabetes Association recommends that a primary goal of therapy should be <7% and that physicians should reevaluate the treatment regimen in patients with HbA1c values consistently >8%.    Hgb A1c MFr Bld  Date/Time Value Ref Range Status  09/24/2022 05:44 PM 9.8 (H) 4.8 - 5.6 % Final    Comment:    (NOTE) Pre diabetes:          5.7%-6.4%  Diabetes:              >6.4%  Glycemic control for   <7.0% adults with diabetes   08/08/2021 01:43 PM 7.4 (H) 4.8 - 5.6 % Final    Comment:    (NOTE) Pre diabetes:          5.7%-6.4%  Diabetes:              >6.4%  Glycemic control for   <7.0% adults with diabetes     CBG: Recent Labs  Lab 09/28/22 1434 09/28/22 1715 09/28/22 1938 09/28/22 2321 09/29/22 0310  GLUCAP 123* 158* 163* 186* 192*    Review of Systems:   Unable to assess pt mechanically intubated   Past Medical History:  He,  has a past medical history of Adenocarcinoma of colon ([place]) (09/08/2022), Cholelithiasis, Chronic gastritis, Colon polyps, Coronary artery calcification seen on CT scan, Diastolic dysfunction, Diverticulosis, Hypertension, Long term current use of anticoagulant, Nephrolithiasis, Paroxysmal  A-fib ([place]), and T2DM (type 2 diabetes mellitus) ([place]).   Surgical History:   Past Surgical History:  Procedure Laterality Date   CARDIOVERSION N/A 12/22/2018   Procedure: CARDIOVERSION (CATH LAB);  Surgeon: Corey Skains, MD;  Location: [facility] ORS;  Service: Cardiovascular;  Laterality: N/A;   CARDIOVERSION N/A 08/18/2021   Procedure: CARDIOVERSION;  Surgeon: Corey Skains, MD;  Location: [facility] ORS;  Service: Cardiovascular;  Laterality: N/A;   CARDIOVERSION N/A 03/11/2022   Procedure: CARDIOVERSION;  Surgeon: Corey Skains, MD;  Location: [facility] ORS;  Service: Cardiovascular;  Laterality: N/A;   CARDIOVERSION N/A 03/31/2022   Procedure: CARDIOVERSION;  Surgeon: Corey Skains, MD;  Location: [facility] ORS;  Service: Cardiovascular;  Laterality: N/A;   CHOLECYSTECTOMY N/A 09/24/2022   Procedure: LAPAROSCOPIC CHOLECYSTECTOMY;  Surgeon: Jules Husbands, MD;  Location: [facility] ORS;  Service: General;  Laterality: N/A;   COLONOSCOPY WITH PROPOFOL N/A 09/08/2022   Procedure: COLONOSCOPY WITH PROPOFOL;  Surgeon: Lucilla Lame, MD;  Location: [facility]COPY;  Service: Endoscopy;  Laterality: N/A;   ELECTROPHYSIOLOGIC STUDY N/A 07/09/2016   Procedure: CARDIOVERSION;  Surgeon: Dionisio David, MD;  Location: [facility] ORS;  Service: Cardiovascular;  Laterality: N/A;   LAPAROSCOPIC RIGHT COLECTOMY N/A 09/24/2022   Procedure: LAPAROSCOPIC RIGHT COLECTOMY, RNFA to assist;  Surgeon: Jules Husbands, MD;  Location: [facility] ORS;  Service: General;  Laterality: N/A;   TEE WITHOUT CARDIOVERSION N/A 07/09/2016   Procedure: TRANSESOPHAGEAL ECHOCARDIOGRAM (TEE);  Surgeon: Dionisio David, MD;  Location: [facility] ORS;  Service: Cardiovascular;  Laterality: N/A;   web fingers repaired     as a child     Social History:   reports that he has never smoked. He has never been exposed to tobacco smoke. He has never used smokeless tobacco. He reports that he does not drink alcohol and does not use drugs.   Family History:  His family history includes COPD in his father; Diabetes in his mother; Heart disease in his mother; Varicose Veins in his mother.   Allergies No Known Allergies   Home Medications  Prior to Admission medications   Medication Sig Start Date End Date Taking? Authorizing Provider  bisacodyl (DULCOLAX) 5 MG EC tablet Take all 4 tablets at 8 am the morning prior to your surgery. 09/14/22  Yes Pabon, Diego F, MD  Cholecalciferol (VITAMIN D) 50 MCG (2000 UT) CAPS Take 2,000 Units by mouth daily.   Yes [provider]  flecainide (TAMBOCOR) 100 MG tablet Take 100 mg by mouth 2 (two) times daily. 03/17/22  Yes [provider]  glipiZIDE (GLUCOTROL XL) 10 MG 24 hr tablet Take 10 mg by mouth 2 (two) times daily.    Yes [provider]  metFORMIN (GLUCOPHAGE-XR) 500 MG 24 hr tablet Take 500 mg by mouth daily with breakfast. 02/10/21  Yes [provider]  metoprolol (LOPRESSOR) 100 MG tablet Take 100 mg by mouth 2 (two) times daily.   Yes [provider]  metroNIDAZOLE (FLAGYL) 500 MG tablet Take 2 tablets at 8AM, take 2 tablets at [facility], and take 2 tablets at 8PM the day prior to your surgery 09/14/22  Yes Pabon, Diego F, MD  Multiple Vitamins-Minerals (MULTIVITAMIN WITH MINERALS) tablet Take 1 tablet by mouth daily with breakfast.    Yes [provider]  neomycin (MYCIFRADIN) 500 MG tablet Take 2 tablet at 8am, take 2 tablets at 2pm, and take 2 tablets at 8pm the day prior to your surgery 09/14/22  Yes Pabon, Diego F, MD  Omega-3 Fatty Acids (FISH OIL) 1000 MG CAPS Take 1,000 mg by mouth daily.   Yes [provider]  OZEMPIC, 1 MG/DOSE, 4 MG/3ML SOPN Inject 1 mg into the skin every Wednesday. Patient stopped taking these medications 08/02/21  Yes [provider]  polyethylene glycol powder (MIRALAX) 17 GM/SCOOP powder Mix full container in 64 ounces of Gatorade or other clear liquid. NO Red 09/14/22  Yes Pabon, [place], MD  Turmeric 400 MG  CAPS Take 400 mg by mouth daily.   Yes [provider]  vitamin B-12 (CYANOCOBALAMIN) 100 MCG tablet Take 500 mcg by mouth daily.   Yes [provider]  Vitamin E 400 units TABS Take 400 Units by mouth daily.   Yes [provider]  apixaban (ELIQUIS) 5 MG TABS tablet Take 5 mg by mouth 2 (two) times daily.    [provider]  Coenzyme Q10 (COQ10) 100 MG CAPS Take 100 mg by mouth daily.    [provider]     Critical care time: 40 minutes     Donell Beers, [place] Pager [phone] (please enter 7 digits) PCCM Consult Pager [phone] (please enter 7 digits)

## 2022-09-29 NOTE — Consult Note (Signed)
ANTICOAGULATION CONSULT NOTE  Pharmacy Consult for Heparin Infusion Indication: atrial fibrillation  Patient Measurements: Height: '6\' 2"'$  (188 cm) Weight: 118 kg (260 lb 2.3 oz) IBW/kg (Calculated) : 82.2 Heparin Dosing Weight: 107.3 kg  Labs: Recent Labs    09/27/22 1222 09/27/22 2028 09/28/22 0458 09/28/22 0459 09/28/22 1449 09/28/22 1450 09/28/22 1807 09/28/22 1945 09/28/22 2232 09/29/22 0106 09/29/22 1041 09/29/22 1140 09/29/22 1509  HGB  --   --   --  9.1*  --  7.6* 7.3*  --   --  7.6*  --   --   --   HCT  --   --   --  30.1*  --  26.0* 25.1*  --   --  25.4*  --   --   --   PLT  --   --   --  303  --  259  --   --   --  254  --   --   --   APTT 44*  --   --   --   --   --   --   --   --   --   --   --   --   LABPROT 22.1*  --  23.4*  --   --   --   --   --   --   --   --   --   --   INR 2.0*  --  2.1*  --   --   --   --   --   --   --   --   --   --   HEPARINUNFRC <0.10* <0.10*  --  0.13*  --   --   --   --   --   --   --   --   --   CREATININE  --   --   --  1.86*   < >  --  2.71*  --   --  2.97* 3.76* 3.59* 4.12*  TROPONINIHS  --   --   --   --    < > 90*  --  80* 109* 70*  --   --   --    < > = values in this interval not displayed.     Estimated Creatinine Clearance: 24.1 mL/min (A) (by C-G formula based on SCr of 4.12 mg/dL (H)).   Medical History: Past Medical History:  Diagnosis Date   Adenocarcinoma of colon ([place]) 09/08/2022   Cholelithiasis    Chronic gastritis    Colon polyps    Coronary artery calcification seen on CT scan    Diastolic dysfunction    a.) TTE 04/09/2022: EF >55%, triv TR, G1DD   Diverticulosis    Hypertension    Long term current use of anticoagulant    a.) apixaban   Nephrolithiasis    Paroxysmal A-fib ([place])    a.) CHA2DS2VASc = 3 (age, HTN, T2DM);  b.) s/p DCCV 07/09/2016 (100 J x1), 12/22/2018 (120 J x1), 08/18/2021 (120 J x1), 03/11/2022 (120 J x1), 03/31/2022 (120 J x1); c.) rate/rhythm maintained on oral flecanide +  metoprolol succinate; chronically anticoagulated with apixaban   T2DM (type 2 diabetes mellitus) ([place])    Assessment: Nicolas Zuniga is a 66 y.o. male presenting for laparoscopic R colectomy and cholecystectomy. PMH significant for recently diagnosed adenocarcinoma of the colon, DM, HTN, Afib on metoprolol, flecainide, and apixaban. Patient was on [facility] PTA per chart review, last  dose of apixaban PTA reported to be on 10/16. Last enoxaparin dose 10/20 AM. Pharmacy has been consulted to initiate and manage heparin infusion.   Baseline Labs: aPTT 44, PT 22.1, INR 2.0, Hgb 9.3, Hct 31.1, Plt 208   Patient previously on heparin this admission. Noted patient required multiple titrations due to subtherapeutic levels, last rate with levels was 1950 units/hr with HL 0.13. Anticoagulation was subsequently discontinued for ExLap.  Goal of Therapy:  Heparin level 0.3 - 0.5 units/ml (no bolus protocol) Monitor platelets by anticoagulation protocol: Yes   Plan:  --Given recent surgery and risk for bleeding, will re-initiate heparin at low initial rate of 1500 units/hr, no bolus --Will check heparin level 8 hours from initiation of infusion --Daily CBC per protocol while on IV heparin; note worsening anemia & thrombocytopenia  Thank you for allowing pharmacy to be a part of this patient's care.  Benita Gutter  09/29/2022 4:39 PM

## 2022-09-29 NOTE — Progress Notes (Addendum)
0530 Report received. Patient now in a slow A.Fib. Cardizem and Precedex stopped last night due to [place] rythum and pauses. Patient very anxious. Given prn for pain.0800 Tried SBT without success. Started on insulin drip for Type 2 DM. Multiple VBGs and blood draws for labs. Respiratory rate up and acidosis still present. Rate resumed on ventilator. Patient placed on Precedex for sedation. 1410 Precedex stopped due brady heart rate with increase in pauses.1600 After  successful 2nd SBT patient extubated to 40% BiPAP. 24  Quickly weaned to 30% BiPAP. 1700 Resting quietly. 1800 Comfortable on BiPAP. Has slept most of the evening..1800 TPN and Heparin started  per orders. MD aware of low urine output.

## 2022-09-29 NOTE — Progress Notes (Signed)
Extubation order written. Cuff leak noted.  Patient extubated @ 1600 and placed on BIPAP.

## 2022-09-29 NOTE — Progress Notes (Signed)
[place] NOTE       Patient ID: OBI SCRIMA MRN: [id number] DOB/AGE: 1955-12-24 66 y.o.  Admit date: 09/24/2022 Referring Physician Sharion Settler, NP  Primary Physician Dr. Hortencia Pilar ([facility])  Primary Cardiologist Nehemiah Massed Reason for Consultation Af RVR  HPI: ROHIL LESCH is a 14HFW with a PMH of paroxysmal atrial fibrillation (on flecainide, metoprolol, and Eliquis), HFpEF (EF >55, G1 DD 04/2022), type II diabetes, hypertension, recently diagnosed adenocarcinoma of the colon who presented to [facility] 09/24/2022 for and elective laparoscopic right colectomy ultimately converted to open right colectomy and cholecystectomy.  Postoperative course was complicated by hypotension and bradycardia the evening of postop day 1, concerning for beta-blocker toxicity for which he was started on glucagon infusion.  Rapid response was called overnight on 10/21 for atrial fibrillation with RVR and questionable VT, was started on amiodarone and diltiazem infusions with some heart rate response.  On 10/23 he underwent exploratory laparotomy, abdominal washout, and diverting loop ileostomy for intra-abdominal purulent fluid seen on CT abdomen pelvis.  Postoperatively, he remained intubated and requiring vasopressor support.  He converted to atrial flutter with variable conduction the evening of 10/23.  Interval history: -Awake and following commands on the ventilator this morning, appears very anxious.  -Precedex and diltiazem infusions discontinued due to bradycardia and hypotension overnight, rate controlled in atrial fibrillation in the 80s-90s while awake earlier this morning.  Remains on amiodarone infusion in addition to Levophed, and vasopressin -Chest x-ray with increased right atelectasis and left pleural effusion -Echo performed this morning, pending read   Past Medical History:  Diagnosis Date   Adenocarcinoma of colon ([place]) 09/08/2022   Cholelithiasis     Chronic gastritis    Colon polyps    Coronary artery calcification seen on CT scan    Diastolic dysfunction    a.) TTE 04/09/2022: EF >55%, triv TR, G1DD   Diverticulosis    Hypertension    Long term current use of anticoagulant    a.) apixaban   Nephrolithiasis    Paroxysmal A-fib ([place])    a.) CHA2DS2VASc = 3 (age, HTN, T2DM);  b.) s/p DCCV 07/09/2016 (100 J x1), 12/22/2018 (120 J x1), 08/18/2021 (120 J x1), 03/11/2022 (120 J x1), 03/31/2022 (120 J x1); c.) rate/rhythm maintained on oral flecanide + metoprolol succinate; chronically anticoagulated with apixaban   T2DM (type 2 diabetes mellitus) ([place])     Past Surgical History:  Procedure Laterality Date   CARDIOVERSION N/A 12/22/2018   Procedure: CARDIOVERSION (CATH LAB);  Surgeon: Corey Skains, MD;  Location: [facility] ORS;  Service: Cardiovascular;  Laterality: N/A;   CARDIOVERSION N/A 08/18/2021   Procedure: CARDIOVERSION;  Surgeon: Corey Skains, MD;  Location: [facility] ORS;  Service: Cardiovascular;  Laterality: N/A;   CARDIOVERSION N/A 03/11/2022   Procedure: CARDIOVERSION;  Surgeon: Corey Skains, MD;  Location: [facility] ORS;  Service: Cardiovascular;  Laterality: N/A;   CARDIOVERSION N/A 03/31/2022   Procedure: CARDIOVERSION;  Surgeon: Corey Skains, MD;  Location: [facility] ORS;  Service: Cardiovascular;  Laterality: N/A;   CHOLECYSTECTOMY N/A 09/24/2022   Procedure: LAPAROSCOPIC CHOLECYSTECTOMY;  Surgeon: Jules Husbands, MD;  Location: [facility] ORS;  Service: General;  Laterality: N/A;   COLONOSCOPY WITH PROPOFOL N/A 09/08/2022   Procedure: COLONOSCOPY WITH PROPOFOL;  Surgeon: Lucilla Lame, MD;  Location: [facility] ENDOSCOPY;  Service: Endoscopy;  Laterality: N/A;   ELECTROPHYSIOLOGIC STUDY N/A 07/09/2016   Procedure: CARDIOVERSION;  Surgeon: Dionisio David, MD;  Location: [facility] ORS;  Service: Cardiovascular;  Laterality: N/A;   LAPAROSCOPIC RIGHT COLECTOMY N/A 09/24/2022   Procedure: LAPAROSCOPIC RIGHT COLECTOMY, RNFA to assist;   Surgeon: Jules Husbands, MD;  Location: [facility] ORS;  Service: General;  Laterality: N/A;   TEE WITHOUT CARDIOVERSION N/A 07/09/2016   Procedure: TRANSESOPHAGEAL ECHOCARDIOGRAM (TEE);  Surgeon: Dionisio David, MD;  Location: [facility] ORS;  Service: Cardiovascular;  Laterality: N/A;   web fingers repaired     as a child    Medications Prior to Admission  Medication Sig Dispense Refill Last Dose   bisacodyl (DULCOLAX) 5 MG EC tablet Take all 4 tablets at 8 am the morning prior to your surgery. 4 tablet 0 09/23/2022   Cholecalciferol (VITAMIN D) 50 MCG (2000 UT) CAPS Take 2,000 Units by mouth daily.   Past Week   flecainide (TAMBOCOR) 100 MG tablet Take 100 mg by mouth 2 (two) times daily.   09/24/2022   glipiZIDE (GLUCOTROL XL) 10 MG 24 hr tablet Take 10 mg by mouth 2 (two) times daily.    09/24/2022   metFORMIN (GLUCOPHAGE-XR) 500 MG 24 hr tablet Take 500 mg by mouth daily with breakfast.   Past Month   metoprolol (LOPRESSOR) 100 MG tablet Take 100 mg by mouth 2 (two) times daily.   09/24/2022   metroNIDAZOLE (FLAGYL) 500 MG tablet Take 2 tablets at 8AM, take 2 tablets at [facility], and take 2 tablets at 8PM the day prior to your surgery 6 tablet 0 09/23/2022   Multiple Vitamins-Minerals (MULTIVITAMIN WITH MINERALS) tablet Take 1 tablet by mouth daily with breakfast.    Past Week   neomycin (MYCIFRADIN) 500 MG tablet Take 2 tablet at 8am, take 2 tablets at 2pm, and take 2 tablets at 8pm the day prior to your surgery 6 tablet 0 09/23/2022   Omega-3 Fatty Acids (FISH OIL) 1000 MG CAPS Take 1,000 mg by mouth daily.   Past Week   OZEMPIC, 1 MG/DOSE, 4 MG/3ML SOPN Inject 1 mg into the skin every Wednesday. Patient stopped taking these medications   Past Month   polyethylene glycol powder (MIRALAX) 17 GM/SCOOP powder Mix full container in 64 ounces of Gatorade or other clear liquid. NO Red 238 g 0 09/23/2022   Turmeric 400 MG CAPS Take 400 mg by mouth daily.   Past Week   vitamin B-12 (CYANOCOBALAMIN) 100 MCG  tablet Take 500 mcg by mouth daily.   Past Week   Vitamin E 400 units TABS Take 400 Units by mouth daily.   Past Week   apixaban (ELIQUIS) 5 MG TABS tablet Take 5 mg by mouth 2 (two) times daily.   09/21/2022   Coenzyme Q10 (COQ10) 100 MG CAPS Take 100 mg by mouth daily.   09/21/2022   Social History   Socioeconomic History   Marital status: Married    Spouse name: Not on file   Number of children: Not on file   Years of education: Not on file   Highest education level: Not on file  Occupational History   Not on file  Tobacco Use   Smoking status: Never    Passive exposure: Never   Smokeless tobacco: Never  Vaping Use   Vaping Use: Never used  Substance and Sexual Activity   Alcohol use: No   Drug use: No   Sexual activity: Not on file  Other Topics Concern   Not on file  Social History Narrative   Lives with wife, [name], with on pet, cat.   Social Determinants of Health   Financial  Resource Strain: Not on file  Food Insecurity: No Food Insecurity (09/24/2022)   Hunger Vital Sign    Worried About Running Out of Food in the Last Year: Never true    Ran Out of Food in the Last Year: Never true  Transportation Needs: No Transportation Needs (09/24/2022)   PRAPARE - Hydrologist (Medical): No    Lack of Transportation (Non-Medical): No  Physical Activity: Not on file  Stress: Not on file  Social Connections: Not on file  Intimate Partner Violence: Not At Risk (09/24/2022)   Humiliation, Afraid, Rape, and Kick questionnaire    Fear of Current or Ex-Partner: No    Emotionally Abused: No    Physically Abused: No    Sexually Abused: No    Family History  Problem Relation Age of Onset   Varicose Veins Mother    Heart disease Mother    Diabetes Mother    COPD Father      Vitals:   09/29/22 0630 09/29/22 0645 09/29/22 0700 09/29/22 0715  BP: 113/67 102/67 110/74 126/74  Pulse: 82 74 67 83  Resp: (!) 25 (!) 24 (!) 24 (!) 26  Temp:       TempSrc:      SpO2: 98% 97% 96% 96%  Weight:      Height:        PHYSICAL EXAM General: Pleasant acutely ill-appearing middle-aged Caucasian male,, awake, on ventilator. HEENT:  Normocephalic and atraumatic. NGT present Neck:  No JVD.  Lungs: Mechanically ventilated breath sounds, some coarseness to auscultation anteriorly.  No appreciable wheezes. Heart: irregularly irregular rhythm with controlled rate. Normal S1 and S2 without gallops or murmurs.  Abdomen: Ileostomy present RUQ with trace dark-colored fluid in ostomy bag.  Dry dressing in place over midline. Msk: Normal strength and tone for age. Extremities: Warm and well perfused. No clubbing, cyanosis.  Trace peripheral and upper extremity edema. SCDs Neuro: Alert and oriented X 3.  Able to shake his head yes and no and give me a thumbs up/thumbs down to questions Psych: Anxious appearing.  Labs: Basic Metabolic Panel: Recent Labs    09/28/22 0459 09/28/22 1449 09/28/22 1450 09/28/22 1807 09/29/22 0106  NA 128*   < >  --  126* 130*  K 4.0   < >  --  4.0 4.1  CL 96*   < >  --  96* 94*  CO2 16*   < >  --  14* 16*  GLUCOSE 204*   < >  --  268* 324*  BUN 25*   < >  --  27* 29*  CREATININE 1.86*   < >  --  2.71* 2.97*  CALCIUM 8.4*   < >  --  7.8* 7.5*  MG 1.9  --   --   --  1.8  PHOS  --   --  4.4  --  3.1   < > = values in this interval not displayed.    Liver Function Tests: Recent Labs    09/26/22 2311 09/28/22 1449  AST 32 22  ALT 14 10  ALKPHOS 40 50  BILITOT 0.9 0.8  PROT 6.0* 4.4*  ALBUMIN 2.9* 2.2*    No results for input(s): "LIPASE", "AMYLASE" in the last 72 hours. CBC: Recent Labs    09/28/22 1450 09/28/22 1807 09/29/22 0106  WBC 14.2*  --  13.4*  NEUTROABS 9.3*  --  9.0*  HGB 7.6* 7.3* 7.6*  HCT 26.0* 25.1*  25.4*  MCV 80.2  --  78.9*  PLT 259  --  254    Cardiac Enzymes: Recent Labs    09/28/22 1450 09/28/22 1945 09/28/22 2232  TROPONINIHS 90* 80* 109*   BNP: No results for  input(s): "BNP" in the last 72 hours. D-Dimer: No results for input(s): "DDIMER" in the last 72 hours. Hemoglobin A1C: No results for input(s): "HGBA1C" in the last 72 hours. Fasting Lipid Panel: No results for input(s): "CHOL", "HDL", "LDLCALC", "TRIG", "CHOLHDL", "LDLDIRECT" in the last 72 hours. Thyroid Function Tests: Recent Labs    09/27/22 0332  TSH 3.741    Anemia Panel: No results for input(s): "VITAMINB12", "FOLATE", "FERRITIN", "TIBC", "IRON", "RETICCTPCT" in the last 72 hours.   Radiology: DG Abd 1 View  Result Date: 09/28/2022 CLINICAL DATA:  Enteric catheter placement, postop EXAM: ABDOMEN - 1 VIEW COMPARISON:  09/26/2022 FINDINGS: Frontal view of the lower chest and upper abdomen demonstrates enteric catheter passing below diaphragm, tip projecting over the gastric antrum. Left basilar consolidation compatible with atelectasis. Bowel gas pattern is unremarkable. IMPRESSION: 1. Enteric catheter tip projecting over the gastric antrum. Electronically Signed   By: Randa Ngo M.D.   On: 09/28/2022 15:52   DG Chest Port 1 View  Result Date: 09/28/2022 CLINICAL DATA:  Postop, intubated EXAM: PORTABLE CHEST 1 VIEW COMPARISON:  09/27/2022 FINDINGS: Single frontal view of the chest demonstrates endotracheal tube overlying tracheal air column tip at level of thoracic inlet. Enteric catheter passes below diaphragm tip excluded by collimation. Right-sided PICC tip overlies the right atrium. Numerous cardiac leads overlie the central chest. The cardiac silhouette is unremarkable. Lung volumes are diminished, with left basilar consolidation most consistent with atelectasis. Trace left pleural effusion. No pneumothorax. No acute bony abnormalities. IMPRESSION: 1. Support devices as above. 2. Increasing left basilar consolidation, favor atelectasis over pneumonia. 3. Trace left pleural effusion. Electronically Signed   By: Randa Ngo M.D.   On: 09/28/2022 15:51   CT ABDOMEN PELVIS WO  CONTRAST  Result Date: 09/28/2022 CLINICAL DATA:  Status post right hemicolectomy 09/24/2022. Anastomotic complication suspected. EXAM: CT ABDOMEN AND PELVIS WITHOUT CONTRAST TECHNIQUE: Multidetector CT imaging of the abdomen and pelvis was performed following the standard protocol without IV contrast. RADIATION DOSE REDUCTION: This exam was performed according to the departmental dose-optimization program which includes automated exposure control, adjustment of the mA and/or kV according to patient size and/or use of iterative reconstruction technique. COMPARISON:  09/27/2022 FINDINGS: Lower chest: Bibasilar atelectasis noted with tiny bilateral pleural effusions. Hepatobiliary: No suspicious focal abnormality in the liver on this study without intravenous contrast. Gallbladder is surgically absent. No intrahepatic or extrahepatic biliary dilation. Pancreas: No focal mass lesion. No dilatation of the main duct. No intraparenchymal cyst. No peripancreatic edema. Spleen: No splenomegaly. No focal mass lesion. Adrenals/Urinary Tract: No adrenal nodule or mass. Contrast in the kidneys compatible with residual from yesterday's infuse scan. Small cyst noted left kidney. No evidence for hydroureter. The urinary bladder appears normal for the degree of distention. Stomach/Bowel: Stomach is distended with gas and fluid. Duodenum is normally positioned as is the ligament of Treitz. No small bowel wall thickening. No small bowel dilatation. The distal ileum at the ileocolic anastomosis in the proximal transverse colon at the anastomosis is well opacified with enteric contrast material. While there is no evidence for gross contrast extravasation at the level of the anastomosis, the small fluid collection identified on yesterday's study has increased in attenuation in the interval (26 Hounsfield units previously versus  67 Hounsfield units today). This increase in attenuation is concerning for some trace spill of contrast  from the anastomosis into the collection although blood products or infectious debris could cause increase in attenuation. This finding is associated with a slight increase in fluid volume in the right paracolic gutter. Diverticular changes are seen in the left colon as before. Vascular/Lymphatic: There is mild atherosclerotic calcification of the abdominal aorta without aneurysm. There is no gastrohepatic or hepatoduodenal ligament lymphadenopathy. No retroperitoneal or mesenteric lymphadenopathy. No pelvic sidewall lymphadenopathy. Reproductive: The hypoattenuating collection seen previously in the prostate gland are less prominent today. Other: Free fluid and free gas in the peritoneal cavity is again noted. Free gas is not unexpected 4 days out from surgery. As noted above, the volume of fluid in the right paracolic gutter is slightly increased in the interval. Musculoskeletal: Gas in the midline subcutaneous tissues deep to the staple line has increased in the interval No worrisome lytic or sclerotic osseous abnormality. IMPRESSION: 1. While there is no evidence for gross enteric contrast extravasation at the level of the ileocolic anastomosis, the small fluid collection adjacent to the anastomosis identified on yesterday's study has increased in attenuation in the interval. This increase in attenuation is concerning for some trace spill of contrast from the anastomosis into the collection. 2. Slight increase in fluid volume in the right paracolic gutter with similar fluid volume around the liver and in the left paracolic gutter. Collections of interloop mesenteric fluid are not substantially changed. 3. Free gas in the peritoneal cavity is not unexpected 4 days out from surgery. 4. Gas in the midline subcutaneous tissues deep to the staple line has increased in the interval. 5. Contrast in the kidneys compatible with residual from yesterday's infused scan. 6. Bibasilar atelectasis with tiny bilateral pleural  effusions. 7. Aortic Atherosclerosis (ICD10-I70.0). Findings were Dr. Dahlia Byes at approximately 1105 hours on 09/28/2022. Electronically Signed   By: Misty Stanley M.D.   On: 09/28/2022 11:22   DG Chest Port 1 View  Result Date: 09/27/2022 CLINICAL DATA:  PICC line placement. EXAM: PORTABLE CHEST 1 VIEW COMPARISON:  Abdominal series 09/26/2022. CT abdomen and pelvis 09/27/2022 FINDINGS: Shallow inspiration with atelectasis in the lung bases. Cardiac enlargement. No airspace disease or consolidation in the lungs. A right PICC line has been placed with tip over the cavoatrial junction. No pneumothorax. Pneumoperitoneum is demonstrated below the right hemidiaphragm as seen on prior studies, likely postoperative. IMPRESSION: 1. Right PICC line appears in satisfactory position. 2. Shallow inspiration with atelectasis in the lung bases. Cardiac enlargement. 3. Pneumoperitoneum as seen on prior studies, probably postoperative. Electronically Signed   By: Lucienne Capers M.D.   On: 09/27/2022 19:25   [place] EKG SITE RITE  Result Date: 09/27/2022 If Site Rite image not attached, placement could not be confirmed due to current cardiac rhythm.  CT ABDOMEN PELVIS W CONTRAST  Result Date: 09/27/2022 CLINICAL DATA:  66 year old male with RIGHT colectomy for adenocarcinoma and cholecystectomy performed on 09/24/2022. Hypotension and abdominal discomfort. EXAM: CT ABDOMEN AND PELVIS WITH CONTRAST TECHNIQUE: Multidetector CT imaging of the abdomen and pelvis was performed using the standard protocol following bolus administration of intravenous contrast. RADIATION DOSE REDUCTION: This exam was performed according to the departmental dose-optimization program which includes automated exposure control, adjustment of the mA and/or kV according to patient size and/or use of iterative reconstruction technique. CONTRAST:  140m OMNIPAQUE IOHEXOL 350 MG/ML SOLN COMPARISON:  09/17/2022 CT FINDINGS: Lower chest: Trace bilateral  pleural effusions and bibasilar  atelectasis noted. Hepatobiliary: The liver is unremarkable. The patient is status post cholecystectomy. There is no evidence of intrahepatic or extrahepatic biliary dilatation. Pancreas: A small amount of ill-defined fluid anterior to the pancreas is noted but the remainder of the pancreas is unremarkable. Spleen: Unremarkable Adrenals/Urinary Tract: No significant abnormalities of the kidneys, adrenal glands or bladder noted. No evidence of hydronephrosis. Stomach/Bowel: RIGHT colectomy and ileo colonic anastomosis noted. There is a small amount of ill-defined high density fluid along the suture line (images 35-39: Series 4), which may represent a small amount of extravasated contrast/leak. There is a small amount of ascites within the abdomen and pelvis with small to moderate amount of pneumoperitoneum within the abdomen. There is no evidence of focal collection/defined abscess. There is no evidence of bowel obstruction. No definite bowel wall thickening is identified. Vascular/Lymphatic: Aortic atherosclerosis. No enlarged abdominal or pelvic lymph nodes. Reproductive: 2 low-density areas within the prostate gland are noted, measuring 1.7 cm on the LEFT and 1 cm on the RIGHT. Other: Ill-defined stranding within the abdomen and pelvis noted likely recent surgical changes. Musculoskeletal: No acute or suspicious bony abnormalities are identified. IMPRESSION: 1. Ill-defined slightly high density fluid along the ileocolic suture line, which may represent a small amount of extravasated contrast/leak. Small to moderate amount of pneumoperitoneum and small amount of ascites within the abdomen and pelvis, not unexpected given surgery but nonspecific. No evidence of focal collection/defined abscess. 2. Trace bilateral pleural effusions and bibasilar atelectasis. 3. 2 low-density areas within the prostate gland, nonspecific but correlate with PSA. 4.  Aortic Atherosclerosis (ICD10-I70.0).  Critical Value/emergent results discussed by phone at the time of interpretation on 09/27/2022 at 1:50 pm to provider DIEGO PABON , who verbally acknowledged these results. Electronically Signed   By: Margarette Canada M.D.   On: 09/27/2022 13:51   CT Angio Chest Pulmonary Embolism (PE) W or WO Contrast  Result Date: 09/27/2022 CLINICAL DATA:  Shortness of breath. EXAM: CT ANGIOGRAPHY CHEST WITH CONTRAST TECHNIQUE: Multidetector CT imaging of the chest was performed using the standard protocol during bolus administration of intravenous contrast. Multiplanar CT image reconstructions and MIPs were obtained to evaluate the vascular anatomy. RADIATION DOSE REDUCTION: This exam was performed according to the departmental dose-optimization program which includes automated exposure control, adjustment of the mA and/or kV according to patient size and/or use of iterative reconstruction technique. CONTRAST:  121m OMNIPAQUE IOHEXOL 350 MG/ML SOLN COMPARISON:  September 17, 2022. FINDINGS: Cardiovascular: Satisfactory opacification of the pulmonary arteries to the segmental level. No evidence of pulmonary embolism. Normal heart size. No pericardial effusion. Mediastinum/Nodes: No enlarged mediastinal, hilar, or axillary lymph nodes. Thyroid gland, trachea, and esophagus demonstrate no significant findings. Lungs/Pleura: No pneumothorax is noted. Minimal bilateral pleural effusions are noted with adjacent subsegmental atelectasis. Upper Abdomen: Pneumoperitoneum is noted in the visualized portion of upper abdomen consistent with the reported history of recent laparoscopic right colectomy and cholecystectomy. Musculoskeletal: No chest wall abnormality. No acute or significant osseous findings. Review of the MIP images confirms the above findings. IMPRESSION: No definite evidence of pulmonary embolus. Minimal bilateral pleural effusions are noted with adjacent subsegmental atelectasis. Pneumoperitoneum is noted in visualized  portion of upper abdomen consistent with reported history of recent laparoscopic right colectomy and cholecystectomy. Electronically Signed   By: JMarijo ConceptionM.D.   On: 09/27/2022 13:38   DG ABD ACUTE 2+V W 1V CHEST  Result Date: 09/26/2022 CLINICAL DATA:  Ileus following open right colectomy on 09/24/2022 EXAM: DG ABDOMEN ACUTE WITH  1 VIEW CHEST COMPARISON:  09/17/2022 FINDINGS: Gaseous distension of the stomach. Air is seen within the colon. No dilated loops of small bowel. Small-moderate volume pneumoperitoneum. Numerous surgical clips within the right hemiabdomen. Midline abdominal skin staples. Heart size within normal limits. Low lung volumes. Trace left pleural effusion with left basilar atelectasis. No pneumothorax. IMPRESSION: 1. Small-moderate volume pneumoperitoneum, likely related to recent surgery. 2. Gaseous distension of the stomach. No dilated loops of small bowel to suggest obstruction. Electronically Signed   By: Davina Poke D.O.   On: 09/26/2022 10:57   CT Chest W Contrast  Result Date: 09/18/2022 CLINICAL DATA:  Colon cancer diagnosed on 09/08/2022. Elevated CEA. Staging evaluation. EXAM: CT CHEST, ABDOMEN, AND PELVIS WITH CONTRAST TECHNIQUE: Multidetector CT imaging of the chest, abdomen and pelvis was performed following the standard protocol during bolus administration of intravenous contrast. RADIATION DOSE REDUCTION: This exam was performed according to the departmental dose-optimization program which includes automated exposure control, adjustment of the mA and/or kV according to patient size and/or use of iterative reconstruction technique. CONTRAST:  173m OMNIPAQUE IOHEXOL 300 MG/ML  SOLN COMPARISON:  Abdomen and pelvis CT dated 03/07/2020 FINDINGS: CT CHEST FINDINGS Cardiovascular: Minimal coronary artery calcification. Normal sized heart. Mediastinum/Nodes: No enlarged mediastinal, hilar, or axillary lymph nodes. Thyroid gland, trachea, and esophagus demonstrate no  significant findings. Lungs/Pleura: Minimal linear atelectasis or scarring at the left lung base. 5 x 3 mm left lower lobe nodule with a mean diameter of 4 mm on image number 100/5. Taking differences in slice thickness into account, this demonstrates little if any change since 03/07/2020. There is also a small calcified granuloma more superiorly in the left lower lobe on image number 77/5. On image number 97/5 and coronal image number 58/6, there is a 4 x 2 mm oval nodular density with a mean diameter of 3 mm. It is difficult determine if this is a lung nodule or prominent vessel branch point. No other lung nodules seen. No pleural fluid. Musculoskeletal: Thoracic and lower cervical spine degenerative changes. No evidence of bony metastatic disease. CT ABDOMEN PELVIS FINDINGS Hepatobiliary: Multiple small noncalcified gallstones in the dependent portion of the gallbladder measuring up 5 mm in maximum diameter each. No gallbladder wall thickening or pericholecystic fluid. Unremarkable liver. No liver masses are seen. Pancreas: Unremarkable. No pancreatic ductal dilatation or surrounding inflammatory changes. Spleen: Normal in size without focal abnormality. Adrenals/Urinary Tract: Normal appearing adrenal glands. Small bilateral renal cysts. These do not need imaging follow-up. Poorly distended urinary bladder. No ureteral abnormalities seen. Stomach/Bowel: Interval concentric mass involving the right colon and ileocecal valve measuring 7.3 cm in length on coronal image number 88/6 with a maximum wall thickness of 1.7 cm on the same image. This mass is confluent with an immediately adjacent mass medially with central low density, measuring 3.8 x 3.0 cm on image number 92/3 and 4.1 cm in length on coronal image number 83/6. There are multiple adjacent lymph node medial to these masses. The largest has a short axis diameter of 2.4 cm on image number 86/3. Multiple descending and sigmoid colon diverticula. Mild wall  thickening involving the terminal ileum at the ileocecal valve. The remainder of the small bowel is unremarkable. Normal-appearing appendix and stomach. Vascular/Lymphatic: Mild atheromatous arterial calcifications without aneurysm. Multiple enlarged mesenteric lymph nodes medial to the right colon mass, as described above. No other enlarged lymph nodes seen. Reproductive: Prostate is unremarkable. Other: No abdominal wall hernia or abnormality. No abdominopelvic ascites. Musculoskeletal: Lumbar spine degenerative changes.  Mild bilateral hip degenerative changes. No evidence of bony metastatic disease. IMPRESSION: 1. Interval concentric mass involving the right colon, ileocecal valve and distal terminal ileum, compatible with the patient's known colon carcinoma. No associated obstruction. 2. Adjacent mass with central low density, compatible with local extension of necrotic tumor or an enlarged, necrotic metastatic lymph node. 3. Multiple adjacent enlarged mesenteric lymph nodes medial to these masses, compatible with metastatic adenopathy. 4. Colonic diverticulosis. 5. Cholelithiasis. 6. 4 mm mean diameter left lower lobe nodule possible 3 mm mean diameter right middle lobe nodule. These are felt to have a low likelihood of representing metastatic disease and could be followed on subsequent examinations. Electronically Signed   By: Claudie Revering M.D.   On: 09/18/2022 17:04   CT Abdomen Pelvis W Contrast  Result Date: 09/18/2022 CLINICAL DATA:  Colon cancer diagnosed on 09/08/2022. Elevated CEA. Staging evaluation. EXAM: CT CHEST, ABDOMEN, AND PELVIS WITH CONTRAST TECHNIQUE: Multidetector CT imaging of the chest, abdomen and pelvis was performed following the standard protocol during bolus administration of intravenous contrast. RADIATION DOSE REDUCTION: This exam was performed according to the departmental dose-optimization program which includes automated exposure control, adjustment of the mA and/or kV  according to patient size and/or use of iterative reconstruction technique. CONTRAST:  134m OMNIPAQUE IOHEXOL 300 MG/ML  SOLN COMPARISON:  Abdomen and pelvis CT dated 03/07/2020 FINDINGS: CT CHEST FINDINGS Cardiovascular: Minimal coronary artery calcification. Normal sized heart. Mediastinum/Nodes: No enlarged mediastinal, hilar, or axillary lymph nodes. Thyroid gland, trachea, and esophagus demonstrate no significant findings. Lungs/Pleura: Minimal linear atelectasis or scarring at the left lung base. 5 x 3 mm left lower lobe nodule with a mean diameter of 4 mm on image number 100/5. Taking differences in slice thickness into account, this demonstrates little if any change since 03/07/2020. There is also a small calcified granuloma more superiorly in the left lower lobe on image number 77/5. On image number 97/5 and coronal image number 58/6, there is a 4 x 2 mm oval nodular density with a mean diameter of 3 mm. It is difficult determine if this is a lung nodule or prominent vessel branch point. No other lung nodules seen. No pleural fluid. Musculoskeletal: Thoracic and lower cervical spine degenerative changes. No evidence of bony metastatic disease. CT ABDOMEN PELVIS FINDINGS Hepatobiliary: Multiple small noncalcified gallstones in the dependent portion of the gallbladder measuring up 5 mm in maximum diameter each. No gallbladder wall thickening or pericholecystic fluid. Unremarkable liver. No liver masses are seen. Pancreas: Unremarkable. No pancreatic ductal dilatation or surrounding inflammatory changes. Spleen: Normal in size without focal abnormality. Adrenals/Urinary Tract: Normal appearing adrenal glands. Small bilateral renal cysts. These do not need imaging follow-up. Poorly distended urinary bladder. No ureteral abnormalities seen. Stomach/Bowel: Interval concentric mass involving the right colon and ileocecal valve measuring 7.3 cm in length on coronal image number 88/6 with a maximum wall thickness  of 1.7 cm on the same image. This mass is confluent with an immediately adjacent mass medially with central low density, measuring 3.8 x 3.0 cm on image number 92/3 and 4.1 cm in length on coronal image number 83/6. There are multiple adjacent lymph node medial to these masses. The largest has a short axis diameter of 2.4 cm on image number 86/3. Multiple descending and sigmoid colon diverticula. Mild wall thickening involving the terminal ileum at the ileocecal valve. The remainder of the small bowel is unremarkable. Normal-appearing appendix and stomach. Vascular/Lymphatic: Mild atheromatous arterial calcifications without aneurysm. Multiple enlarged mesenteric lymph nodes  medial to the right colon mass, as described above. No other enlarged lymph nodes seen. Reproductive: Prostate is unremarkable. Other: No abdominal wall hernia or abnormality. No abdominopelvic ascites. Musculoskeletal: Lumbar spine degenerative changes. Mild bilateral hip degenerative changes. No evidence of bony metastatic disease. IMPRESSION: 1. Interval concentric mass involving the right colon, ileocecal valve and distal terminal ileum, compatible with the patient's known colon carcinoma. No associated obstruction. 2. Adjacent mass with central low density, compatible with local extension of necrotic tumor or an enlarged, necrotic metastatic lymph node. 3. Multiple adjacent enlarged mesenteric lymph nodes medial to these masses, compatible with metastatic adenopathy. 4. Colonic diverticulosis. 5. Cholelithiasis. 6. 4 mm mean diameter left lower lobe nodule possible 3 mm mean diameter right middle lobe nodule. These are felt to have a low likelihood of representing metastatic disease and could be followed on subsequent examinations. Electronically Signed   By: Claudie Revering M.D.   On: 09/18/2022 17:04    ECHO 04/2022 EF >55%, G1 DD with normal LV and RV systolic function  TELEMETRY reviewed by me (LT) 09/29/2022 : A-fib rates early AM  50s-60s, now 80s-90s  EKG reviewed by me: 09/25/2022 sinus rhythm rate 60s  Data reviewed by me (LT) 09/29/2022: Surgery progress note, critical care consult and progress note, hospitalist progress note, CBC, BMP, BNP, troponins, EKGs, vitals, telemetry, I's and O's  Principal Problem:   S/P right colectomy Active Problems:   Diabetes ([place])   Essential hypertension   Paroxysmal A-fib (HCC)   S/P partial resection of colon   Hypotension   Hyponatremia   AKI (acute kidney injury) ([place])   Postoperative intra-abdominal abscess   Adenocarcinoma of colon ([place])   Persistent atrial fibrillation (HCC)   Sepsis ([place])    ASSESSMENT AND PLAN:  Nicolas Zuniga is a 4yoM with a PMH of paroxysmal atrial fibrillation (on flecainide, metoprolol, and Eliquis), diabetes, hypertension, recently diagnosed adenocarcinoma of the colon who [facility] for and elective laparoscopic right colectomy ultimately converted to open right colectomy and cholecystectomy.  Postoperative course was complicated by hypotension and bradycardia the evening of postop day 1, concerning for beta-blocker toxicity for which he was started on glucagon infusion.  Rapid response was called overnight on 10/21 for atrial fibrillation with RVR and questionable VT, was started on amiodarone and diltiazem infusions with some heart rate response.  #Adenocarcinoma of the colon s/p open R colectomy & CCY 10/19 #S/p exploratory laparotomy, abdominal washout, and diverting loop ileostomy 10/23 #Intra-abdominal abscess with ?Septic shock -Remains on Levophed and vasopressin the morning of 10/24 in addition to IV Zosyn, wean vasopressors as tolerated. -Procalcitonin elevated but downtrending from 21.52-18.25, leukocytosis also improving to 13,400 today  #Paroxysmal atrial fibrillation with RVR, Was previously on flecainide, metoprolol, and Eliquis which have all been held after rapid response on 10/21.  His heart rates were  difficult to control, and was in A-fib in the 120s at rest with paroxysms to the 150s throughout the day of 10/23 and converted to atrial flutter with controlled ventricular response in the evening.  Bradycardic overnight so diltiazem and Precedex infusions were discontinued. -Continue to treat causes of increased adrenergic tone including pain, infection, hypoxia -Monitor and replete electrolytes to maintain a K >4, mag >2 -Continue amiodarone infusion for today while he is intubated, once able to take p.o. -Hold diltiazem infusion, can restart if heart rate is uncontrolled. -Revisit ablation on an outpatient basis with Dr. Mylinda Latina -CHA2DS2-VASc 3 (age, htn, dm2).  Eliquis was held  prior to his operation as above, was on a heparin infusion from 10/22  to the morning of 10/23 -Favor restarting heparin versus therapeutic Lovenox while inpatient if hemoglobin remains stable, pending clinical course.  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  Not significantly clinically volume overloaded, although chest x-ray shows with L pleural effusion. -agree with echo -check BNP   #Acute renal failure Renal function today shows a BUN creatinine of 29/2.97 and GFR 22 Query ATN with significant hypotension requiring vasopressor support.  His renal function has been labile since 10/20. on day of admission his baseline was BUN/creatinine 22/1.23 and GFR >60.  He has received multiple CT scans with contrast this admission. -Nephrology consulted by primary team, appreciate their assistance -Consider diuresis if his blood pressure allows  #Postoperative anemia Hgb down trended from 9.1 to a low of 7.3 the evening of 10/23 -Hold therapeutic anticoagulation as above, favor restarting as hemoglobin improves  This patient's plan of care was discussed and created with Dr. Nehemiah Massed and he is in  agreement.  Signed: Tristan Schroeder , PA-C 09/29/2022, 7:57 AM [facility]

## 2022-09-29 NOTE — Progress Notes (Addendum)
[place] Hospital Day(s): 5.   Post op day(s): 1 Day Post-Op.   Interval History:  Patient seen and examined No acute events or new complaints overnight.  Patient remains intubated but alert and participate On 8 mcg Levophed, 0.03 units vasopressin 30 of Amiodarone Leukocytosis actually improved this morning; 13.4K Renal function bumped; sCr - 2.97; UO - 81 ccs No significant electrolyte derangements Lactic acidosis improving; 2.7 NGT in place; 300 ccs Surgical drain; 105 ccs; serosanguinous No stool from diverting ileostomy   Vital signs in last 24 hours: [min-max] current  Temp:  [97.7 F (36.5 C)-99.7 F (37.6 C)] 99.5 F (37.5 C) (10/23 2000) Pulse Rate:  [39-164] 82 (10/24 0600) Resp:  [16-36] 25 (10/24 0600) BP: (79-146)/(40-80) 99/60 (10/24 0600) SpO2:  [90 %-99 %] 98 % (10/24 0600) FiO2 (%):  [40 %] 40 % (10/24 0326)     Height: '6\' 2"'$  (188 cm) Weight: 118 kg BMI (Calculated): 33.39   Intake/Output last 2 shifts:  10/23 0701 - 10/24 0700 In: 3077.9 [P.O.:240; I.V.:2187.9; IV Piggyback:649.9] Out: 736 [Urine:81; Emesis/NG output:300; Drains:105; Blood:50]   Physical Exam:  Constitutional: Intubated but alert, participates in history and exam Respiratory: On ventilator Cardiovascular: regular rate and sinus rhythm  Gastrointestinal: Soft, he does not appear tender, non-distended, no rebound/guarding. Surgical drain with serosanguinous output. Diverting loop colostomy in right abdomen; no gas or stool in bag Integumentary: Midline laparotomy healing via secondary intention, no erythema   Labs:     Latest Ref Rng & Units 09/29/2022    1:06 AM 09/28/2022    6:07 PM 09/28/2022    2:50 PM  CBC  WBC 4.0 - 10.5 K/uL 13.4   14.2   Hemoglobin 13.0 - 17.0 g/dL 7.6  7.3  7.6   Hematocrit 39.0 - 52.0 % 25.4  25.1  26.0   Platelets 150 - 400 K/uL 254   259       Latest Ref Rng & Units 09/29/2022    1:06 AM 09/28/2022     6:07 PM 09/28/2022    2:49 PM  CMP  Glucose 70 - 99 mg/dL 324  268  250   BUN 8 - 23 mg/dL '29  27  26   '$ Creatinine 0.61 - 1.24 mg/dL 2.97  2.71  2.42   Sodium 135 - 145 mmol/L 130  126  126   Potassium 3.5 - 5.1 mmol/L 4.1  4.0  3.9   Chloride 98 - 111 mmol/L 94  96  93   CO2 22 - 32 mmol/L '16  14  16   '$ Calcium 8.9 - 10.3 mg/dL 7.5  7.8  8.2   Total Protein 6.5 - 8.1 g/dL   4.4   Total Bilirubin 0.3 - 1.2 mg/dL   0.8   Alkaline Phos 38 - 126 U/L   50   AST 15 - 41 U/L   22   ALT 0 - 44 U/L   10      Imaging studies: No new pertinent imaging studies   Assessment/Plan: 66 y.o. male  1 Day Post-Op s/p exploratory laparotomy, abdominal washout, and diverting loop ileostomy for intra-abdominal purulent fluid after initial open right colectomy with open cholecystectomy for right colon cancer and cholelithiasis on 10/19   - Greatly appreciate PCCM assistance with vasopressor and ventilator support   - Continue NPO; anticipate ileus given intra-abdominal findings; He will likely need TPN in next 24-48 hours  - Continue IV Abx (Zosyn)  -  Continue foley catheter; AKI; nephrology consult pending  - Monitor abdominal examination; on-going ileostomy function  - Midline wound care: Pack daily with saline moistened gauze, cover, secure  - Continue surgical drain; monitor and record output  - Pain control prn; antiemetics prn  All of the above findings and recommendations were discussed with the patient, and the medical team.  -- Edison Simon, PA-C [facility] 09/29/2022, 7:15 AM M-F: 7am - 4pm

## 2022-09-29 NOTE — Progress Notes (Signed)
Nutrition Follow-up  DOCUMENTATION CODES:   Obesity unspecified  INTERVENTION:   TPN per pharmacy   Recommend thiamine 100mg  daily in TPN x 3 days.   Daily weights   Once appropriate for tube feeds, recommend:  Vital HP @65ml /hr- Initiate at 58ml/hr and increase by 56ml/hr q 8 hours until goal rate is reached.   Free water flushes 46ml q4 hours to maintain tube patency   Regimen provides 1720kcal/day, 177g/day protein and 148ml/day of free water.   Pt at high refeed risk; recommend monitor potassium, magnesium and phosphorus labs daily until stable  NUTRITION DIAGNOSIS:   Increased nutrient needs related to post-op healing as evidenced by estimated needs.  GOAL:   Provide needs based on ASPEN/SCCM guidelines -not met   MONITOR:   Vent status, Labs, Weight trends, Skin, I & O's, Other (Comment) (TPN)  ASSESSMENT:   66 y/o male with h/o DM, HTN, Afib s/p cardioversion, anxiety, HLD, hiatal hernia, cholelithiasis and colon cancer s/p open right colectomy with ileocolic anastomosis and cholecystectomy 44/31 complicated by anastamotic leak s/p exploratory laparotomy and abdominal washout and diverting loop ileostomy 10/23. Pt also with new DKA and AKI.  Pt sedated and ventilated. Wife at bedside reports pt with decreased appetite and oral intake for the past several months r/t abdominal pain and diarrhea. Wife reports that patient has lost ~30lbs over the past several months; pt's UBW is around 300lbs. Per chart, pt is down ~40lbs(13%) since June; this is significant weight loss. Pt alert today and is able to follow commands and shake his head yes or no. NGT remains in place with no output. Surgery recommending NPO for now. Plan today is for TPN; pt has PICC line in place. Pt is likely at high refeed risk. Will plan to initiate tube feeds once appropriate. Pt with AKI; nephrology following.   Medications reviewed and include: protonix, LRS @125ml /hr, insulin, levophed, zosyn,  KCl, vasopressin   Labs reviewed: Na 126(L), K 3.3(L), BUN 36(H), creat 3.59(H), P 3.1 wnl, Mg 1.8 wnl BNP 476.6(H) Wbc- 13.4(H), Hgb 7.6(L), Hct 25.4(L), MCV 78.9(L), MCH 23.6(L), MCHC 29.9(L) Cbgs- 131, 144, 232, 264, 283 x 24 hrs AIC 9.8(H)- 10/19  Patient is currently intubated on ventilator support MV: 16.4 L/min Temp (24hrs), Avg:98.3 F (36.8 C), Min:97.7 F (36.5 C), Max:99.5 F (37.5 C)  Propofol: none   MAP- 59-31mmHg   UOP- 30ml  Drains- 123ml   NUTRITION - FOCUSED PHYSICAL EXAM:  Flowsheet Row Most Recent Value  Orbital Region No depletion  Upper Arm Region No depletion  Thoracic and Lumbar Region No depletion  Buccal Region No depletion  Temple Region No depletion  Clavicle Bone Region No depletion  Clavicle and Acromion Bone Region No depletion  Scapular Bone Region No depletion  Dorsal Hand No depletion  Patellar Region No depletion  Anterior Thigh Region No depletion  Posterior Calf Region No depletion  Edema (RD Assessment) None  Hair Reviewed  Eyes Reviewed  Mouth Reviewed  Skin Reviewed  Nails Reviewed   Diet Order:   Diet Order             Diet NPO time specified  Diet effective now                  EDUCATION NEEDS:   No education needs have been identified at this time  Skin:  Skin Assessment: Skin Integrity Issues: Skin Integrity Issues:: Incisions Incisions: closed abdomen  Last BM:  09/27/22  Height:   Ht Readings from  Last 1 Encounters:  09/28/22 $RemoveB'6\' 2"'cNfxrySn$  (1.88 m)    Weight:   Wt Readings from Last 1 Encounters:  09/27/22 118 kg    Ideal Body Weight:  86.4 kg  BMI:  Body mass index is 33.4 kg/m.  Estimated Nutritional Needs:   Kcal:  1298-1652kcal/day  Protein:  170-180g/day  Fluid:  2.2-2.5L/day  Koleen Distance MS, RD, LDN Please refer to [facility] for RD and/or RD on-call/weekend/after hours pager

## 2022-09-29 NOTE — Consult Note (Signed)
[facility] Consult Note: 09/29/2022    Date of Admission:  09/24/2022           Reason for Consult:  Acute kidney injury   Referring Provider: Jules Husbands, MD Primary Care Provider: Hortencia Pilar, MD   History of Presenting Illness:  Nicolas Zuniga is a 65 y.o. male with history of colon cancer, gallstones, chronic abdominal pain.  Underwent laparoscopic right colectomy and laparoscopic cholecystectomy with placement of ileostomy. Op note indicates bulky mesenteric nodal involvement, mass adherent to the retroperitoneum, mesenteric nodes within terminal ileum. Postop course is complicated by acute respiratory failure requiring ventilator support.  Patient has also developed acute kidney injury.  His baseline creatinine is 1.06 from 09/14/2022.  Creatinine has steadily increased to 4.12 today. Currently intubated when seen.  Requiring Precedex, insulin drip, Levophed, norepinephrine, amiodarone drip and D5 for DKA protocol Imaging: CT abdomen and pelvis without contrast 09/28/2022  Review of Systems: ROS-not available due to patient being intubated  Past Medical History:  Diagnosis Date   Adenocarcinoma of colon ([place]) 09/08/2022   Cholelithiasis    Chronic gastritis    Colon polyps    Coronary artery calcification seen on CT scan    Diastolic dysfunction    a.) TTE 04/09/2022: EF >55%, triv TR, G1DD   Diverticulosis    Hypertension    Long term current use of anticoagulant    a.) apixaban   Nephrolithiasis    Paroxysmal A-fib ([place])    a.) CHA2DS2VASc = 3 (age, HTN, T2DM);  b.) s/p DCCV 07/09/2016 (100 J x1), 12/22/2018 (120 J x1), 08/18/2021 (120 J x1), 03/11/2022 (120 J x1), 03/31/2022 (120 J x1); c.) rate/rhythm maintained on oral flecanide + metoprolol succinate; chronically anticoagulated with apixaban   T2DM (type 2 diabetes mellitus) ([place])     Social History   Tobacco Use   Smoking status: Never    Passive exposure: Never   Smokeless  tobacco: Never  Vaping Use   Vaping Use: Never used  Substance Use Topics   Alcohol use: No   Drug use: No    Family History  Problem Relation Age of Onset   Varicose Veins Mother    Heart disease Mother    Diabetes Mother    COPD Father      OBJECTIVE: Blood pressure (!) 122/58, pulse 74, temperature 97.7 F (36.5 C), temperature source Axillary, resp. rate (!) 23, height '6\' 2"'$  (1.88 m), weight 118 kg, SpO2 98 %.  Physical Exam General appearance: Critically ill HEENT: ET tube in place Pulmonary: Ventilator assisted.  FiO2 40%, PEEP 5. Cardiac: Irregular rhythm Abdomen: Ileostomy in place, distended Extremities: Some dependent edema GU: Foley in place Neuro: Opens eyes to stimulation.  Lab Results Lab Results  Component Value Date   WBC 13.4 (H) 09/29/2022   HGB 7.6 (L) 09/29/2022   HCT 25.4 (L) 09/29/2022   MCV 78.9 (L) 09/29/2022   PLT 254 09/29/2022    Lab Results  Component Value Date   CREATININE 4.12 (H) 09/29/2022   BUN 40 (H) 09/29/2022   NA 130 (L) 09/29/2022   K 3.8 09/29/2022   CL 98 09/29/2022   CO2 19 (L) 09/29/2022    Lab Results  Component Value Date   ALT 10 09/28/2022   AST 22 09/28/2022   ALKPHOS 50 09/28/2022   BILITOT 0.8 09/28/2022     Microbiology: Recent Results (from the past 240 hour(s))  MRSA Next Gen by PCR, Nasal  Status: None   Collection Time: 09/27/22  1:53 AM   Specimen: Nasal Mucosa; Nasal Swab  Result Value Ref Range Status   MRSA by PCR Next Gen NOT DETECTED NOT DETECTED Final    Comment: (NOTE) The GeneXpert MRSA Assay (FDA approved for NASAL specimens only), is one component of a comprehensive MRSA colonization surveillance program. It is not intended to diagnose MRSA infection nor to guide or monitor treatment for MRSA infections. Test performance is not FDA approved in patients less than 30 years old. Performed at [facility], [address] [place], [place] [address]      Medications: Scheduled Meds:  Chlorhexidine Gluconate Cloth  6 each Topical Daily   pantoprazole (PROTONIX) IV  40 mg Intravenous QHS   sodium chloride flush  10-40 mL Intracatheter Q12H   Continuous Infusions:  amiodarone 30 mg/hr (09/29/22 0601)   dexmedetomidine (PRECEDEX) IV infusion Stopped (09/29/22 1410)   dextrose 5% lactated ringers 125 mL/hr at 09/29/22 1133   heparin     insulin 5.5 Units/hr (09/29/22 1630)   lactated ringers 125 mL/hr at 09/29/22 0959   norepinephrine (LEVOPHED) Adult infusion 8 mcg/min (09/29/22 1156)   piperacillin-tazobactam (ZOSYN)  IV 3.375 g (09/29/22 0509)   potassium chloride 10 mEq (09/29/22 1638)   TPN ADULT (ION)     PRN Meds:.dextrose, morphine injection, ondansetron **OR** ondansetron (ZOFRAN) IV, mouth rinse, oxyCODONE, prochlorperazine **OR** prochlorperazine, sodium chloride flush  No Known Allergies  Urinalysis: No results for input(s): "COLORURINE", "LABSPEC", "PHURINE", "GLUCOSEU", "HGBUR", "BILIRUBINUR", "KETONESUR", "PROTEINUR", "UROBILINOGEN", "NITRITE", "LEUKOCYTESUR" in the last 72 hours.  Invalid input(s): "APPERANCEUR"    Imaging: ECHOCARDIOGRAM COMPLETE  Result Date: 09/29/2022    ECHOCARDIOGRAM REPORT   Patient Name:   Nicolas Zuniga Date of Exam: 09/29/2022 Medical Rec #:  [id number]     Height:       74.0 in Accession #:    [id number]    Weight:       260.1 lb Date of Birth:  19-Jan-1956      BSA:          2.430 m Patient Age:    42 years      BP:           126/74 mmHg Patient Gender: M             HR:           83 bpm. Exam Location:  [facility] Procedure: 2D Echo, Color Doppler and Cardiac Doppler Indications:     Abnormal ECG R94.31  History:         Patient has prior history of Echocardiogram examinations, most                  recent 07/09/2016. Risk Factors:Hypertension and Diabetes.                  Paroxysmal Afib.  Sonographer:     Sherrie Sport Referring Phys:  [id number] Teressa Lower Diagnosing Phys: Serafina Royals MD   Sonographer Comments: Echo performed with patient supine and on artificial respirator, Technically challenging study due to limited acoustic windows and no subcostal window. IMPRESSIONS  1. Left ventricular ejection fraction, by estimation, is 65 to 70%. The left ventricle has normal function. The left ventricle has no regional wall motion abnormalities. Left ventricular diastolic parameters were normal.  2. Right ventricular systolic function is normal. The right ventricular size is normal.  3. The mitral valve is normal in structure. Trivial mitral valve regurgitation.  4. The aortic valve is normal in structure. Aortic valve regurgitation is not visualized. FINDINGS  Left Ventricle: Left ventricular ejection fraction, by estimation, is 65 to 70%. The left ventricle has normal function. The left ventricle has no regional wall motion abnormalities. The left ventricular internal cavity size was small. There is no left ventricular hypertrophy. Left ventricular diastolic parameters were normal. Right Ventricle: The right ventricular size is normal. No increase in right ventricular wall thickness. Right ventricular systolic function is normal. Left Atrium: Left atrial size was normal in size. Right Atrium: Right atrial size was normal in size. Pericardium: There is no evidence of pericardial effusion. Mitral Valve: The mitral valve is normal in structure. Trivial mitral valve regurgitation. Tricuspid Valve: The tricuspid valve is normal in structure. Tricuspid valve regurgitation is trivial. Aortic Valve: The aortic valve is normal in structure. Aortic valve regurgitation is not visualized. Aortic valve mean gradient measures 2.0 mmHg. Aortic valve peak gradient measures 3.4 mmHg. Aortic valve area, by VTI measures 3.50 cm. Pulmonic Valve: The pulmonic valve was normal in structure. Pulmonic valve regurgitation is not visualized. Aorta: The aortic root and ascending aorta are structurally normal, with no evidence of  dilitation. IAS/Shunts: No atrial level shunt detected by color flow Doppler.  LEFT VENTRICLE PLAX 2D LVIDd:         4.60 cm LVIDs:         3.30 cm LV PW:         1.20 cm LV IVS:        0.80 cm LVOT diam:     2.00 cm LV SV:         43 LV SV Index:   18 LVOT Area:     3.14 cm  RIGHT VENTRICLE RV Basal diam:  4.10 cm RV Mid diam:    3.80 cm RV S prime:     11.40 cm/s TAPSE (M-mode): 2.0 cm LEFT ATRIUM           Index        RIGHT ATRIUM           Index LA diam:      3.10 cm 1.28 cm/m   RA Area:     14.80 cm LA Vol (A2C): 40.0 ml 16.46 ml/m  RA Volume:   37.20 ml  15.31 ml/m LA Vol (A4C): 70.1 ml 28.84 ml/m  AORTIC VALVE AV Area (Vmax):    2.22 cm AV Area (Vmean):   2.37 cm AV Area (VTI):     3.50 cm AV Vmax:           91.60 cm/s AV Vmean:          67.100 cm/s AV VTI:            0.122 m AV Peak Grad:      3.4 mmHg AV Mean Grad:      2.0 mmHg LVOT Vmax:         64.70 cm/s LVOT Vmean:        50.700 cm/s LVOT VTI:          0.136 m LVOT/AV VTI ratio: 1.11  AORTA Ao Root diam: 3.20 cm MITRAL VALVE               TRICUSPID VALVE MV Area (PHT): 3.40 cm    TR Peak grad:   19.4 mmHg MV Decel Time: 223 msec    TR Vmax:        220.00 cm/s MV E velocity: 85.90 cm/s  SHUNTS                            Systemic VTI:  0.14 m                            Systemic Diam: 2.00 cm Serafina Royals MD Electronically signed by Serafina Royals MD Signature Date/Time: 09/29/2022/12:21:30 PM    Final    DG Abd 1 View  Result Date: 09/28/2022 CLINICAL DATA:  Enteric catheter placement, postop EXAM: ABDOMEN - 1 VIEW COMPARISON:  09/26/2022 FINDINGS: Frontal view of the lower chest and upper abdomen demonstrates enteric catheter passing below diaphragm, tip projecting over the gastric antrum. Left basilar consolidation compatible with atelectasis. Bowel gas pattern is unremarkable. IMPRESSION: 1. Enteric catheter tip projecting over the gastric antrum. Electronically Signed   By: Randa Ngo M.D.   On:  09/28/2022 15:52   DG Chest Port 1 View  Result Date: 09/28/2022 CLINICAL DATA:  Postop, intubated EXAM: PORTABLE CHEST 1 VIEW COMPARISON:  09/27/2022 FINDINGS: Single frontal view of the chest demonstrates endotracheal tube overlying tracheal air column tip at level of thoracic inlet. Enteric catheter passes below diaphragm tip excluded by collimation. Right-sided PICC tip overlies the right atrium. Numerous cardiac leads overlie the central chest. The cardiac silhouette is unremarkable. Lung volumes are diminished, with left basilar consolidation most consistent with atelectasis. Trace left pleural effusion. No pneumothorax. No acute bony abnormalities. IMPRESSION: 1. Support devices as above. 2. Increasing left basilar consolidation, favor atelectasis over pneumonia. 3. Trace left pleural effusion. Electronically Signed   By: Randa Ngo M.D.   On: 09/28/2022 15:51   CT ABDOMEN PELVIS WO CONTRAST  Result Date: 09/28/2022 CLINICAL DATA:  Status post right hemicolectomy 09/24/2022. Anastomotic complication suspected. EXAM: CT ABDOMEN AND PELVIS WITHOUT CONTRAST TECHNIQUE: Multidetector CT imaging of the abdomen and pelvis was performed following the standard protocol without IV contrast. RADIATION DOSE REDUCTION: This exam was performed according to the departmental dose-optimization program which includes automated exposure control, adjustment of the mA and/or kV according to patient size and/or use of iterative reconstruction technique. COMPARISON:  09/27/2022 FINDINGS: Lower chest: Bibasilar atelectasis noted with tiny bilateral pleural effusions. Hepatobiliary: No suspicious focal abnormality in the liver on this study without intravenous contrast. Gallbladder is surgically absent. No intrahepatic or extrahepatic biliary dilation. Pancreas: No focal mass lesion. No dilatation of the main duct. No intraparenchymal cyst. No peripancreatic edema. Spleen: No splenomegaly. No focal mass lesion.  Adrenals/Urinary Tract: No adrenal nodule or mass. Contrast in the kidneys compatible with residual from yesterday's infuse scan. Small cyst noted left kidney. No evidence for hydroureter. The urinary bladder appears normal for the degree of distention. Stomach/Bowel: Stomach is distended with gas and fluid. Duodenum is normally positioned as is the ligament of Treitz. No small bowel wall thickening. No small bowel dilatation. The distal ileum at the ileocolic anastomosis in the proximal transverse colon at the anastomosis is well opacified with enteric contrast material. While there is no evidence for gross contrast extravasation at the level of the anastomosis, the small fluid collection identified on yesterday's study has increased in attenuation in the interval (26 Hounsfield units previously versus 67 Hounsfield units today). This increase in attenuation is concerning for some trace spill of contrast from the anastomosis into the collection although blood products or infectious debris could cause increase in attenuation. This finding is associated with a slight increase in  fluid volume in the right paracolic gutter. Diverticular changes are seen in the left colon as before. Vascular/Lymphatic: There is mild atherosclerotic calcification of the abdominal aorta without aneurysm. There is no gastrohepatic or hepatoduodenal ligament lymphadenopathy. No retroperitoneal or mesenteric lymphadenopathy. No pelvic sidewall lymphadenopathy. Reproductive: The hypoattenuating collection seen previously in the prostate gland are less prominent today. Other: Free fluid and free gas in the peritoneal cavity is again noted. Free gas is not unexpected 4 days out from surgery. As noted above, the volume of fluid in the right paracolic gutter is slightly increased in the interval. Musculoskeletal: Gas in the midline subcutaneous tissues deep to the staple line has increased in the interval No worrisome lytic or sclerotic osseous  abnormality. IMPRESSION: 1. While there is no evidence for gross enteric contrast extravasation at the level of the ileocolic anastomosis, the small fluid collection adjacent to the anastomosis identified on yesterday's study has increased in attenuation in the interval. This increase in attenuation is concerning for some trace spill of contrast from the anastomosis into the collection. 2. Slight increase in fluid volume in the right paracolic gutter with similar fluid volume around the liver and in the left paracolic gutter. Collections of interloop mesenteric fluid are not substantially changed. 3. Free gas in the peritoneal cavity is not unexpected 4 days out from surgery. 4. Gas in the midline subcutaneous tissues deep to the staple line has increased in the interval. 5. Contrast in the kidneys compatible with residual from yesterday's infused scan. 6. Bibasilar atelectasis with tiny bilateral pleural effusions. 7. Aortic Atherosclerosis (ICD10-I70.0). Findings were Dr. Dahlia Byes at approximately 1105 hours on 09/28/2022. Electronically Signed   By: Misty Stanley M.D.   On: 09/28/2022 11:22   DG Chest Port 1 View  Result Date: 09/27/2022 CLINICAL DATA:  PICC line placement. EXAM: PORTABLE CHEST 1 VIEW COMPARISON:  Abdominal series 09/26/2022. CT abdomen and pelvis 09/27/2022 FINDINGS: Shallow inspiration with atelectasis in the lung bases. Cardiac enlargement. No airspace disease or consolidation in the lungs. A right PICC line has been placed with tip over the cavoatrial junction. No pneumothorax. Pneumoperitoneum is demonstrated below the right hemidiaphragm as seen on prior studies, likely postoperative. IMPRESSION: 1. Right PICC line appears in satisfactory position. 2. Shallow inspiration with atelectasis in the lung bases. Cardiac enlargement. 3. Pneumoperitoneum as seen on prior studies, probably postoperative. Electronically Signed   By: Lucienne Capers M.D.   On: 09/27/2022 19:25       Assessment/Plan:  AARIN BLUETT is a 66 y.o. male with medical problems of atrial fibrillation, history of cardioversion, colon mass, hyperlipidemia, type 2 diabetes  was admitted on 09/24/2022 for :  S/P right colectomy [Z90.49] S/P partial resection of colon [Z90.49]  1.  Acute kidney injury Baseline creatinine 1.06 from 09/14/2022.  It has increased to 4.12 as of today.  Urine output is dwindling and patient is almost oligoanuric.  He is currently hypotensive requiring 2 pressors.  Also getting amiodarone drip for rate control for atrial fibrillation. AKI likely secondary to ATN, oligoanuric Renal function is expected to get worse over the next day or 2.  Electrolytes and volume status are acceptable at present.  No acute indication for dialysis at the moment but if the trend continues, will require CRRT or intermittent hemodialysis in the near future. May supplement IV bicarbonate for metabolic acidosis. We will follow closely.  2.  Acute respiratory failure Ventilator dependent at present. Management as per ICU team.   Murlean Iba 09/29/22

## 2022-09-30 ENCOUNTER — Other Ambulatory Visit: Payer: Self-pay | Admitting: Pathology

## 2022-09-30 DIAGNOSIS — E119 Type 2 diabetes mellitus without complications: Secondary | ICD-10-CM | POA: Diagnosis not present

## 2022-09-30 DIAGNOSIS — Z9049 Acquired absence of other specified parts of digestive tract: Secondary | ICD-10-CM | POA: Diagnosis not present

## 2022-09-30 DIAGNOSIS — N179 Acute kidney failure, unspecified: Secondary | ICD-10-CM | POA: Diagnosis not present

## 2022-09-30 DIAGNOSIS — C189 Malignant neoplasm of colon, unspecified: Secondary | ICD-10-CM | POA: Diagnosis not present

## 2022-09-30 LAB — BLOOD GAS, VENOUS
Acid-base deficit: 9.2 mmol/L — ABNORMAL HIGH (ref 0.0–2.0)
Acid-base deficit: 1.3 mmol/L (ref 0.0–2.0)
Bicarbonate: 15.3 mmol/L — ABNORMAL LOW (ref 20.0–28.0)
Bicarbonate: 23.7 mmol/L (ref 20.0–28.0)
O2 Saturation: 85.1 %
Patient temperature: 37
Patient temperature: 37
pCO2, Ven: 29 mmHg — ABNORMAL LOW (ref 44–60)
pCO2, Ven: 40 mmHg — ABNORMAL LOW (ref 44–60)
pH, Ven: 7.33 (ref 7.25–7.43)
pH, Ven: 7.38 (ref 7.25–7.43)
pO2, Ven: 57 mmHg — ABNORMAL HIGH (ref 32–45)

## 2022-09-30 LAB — GLUCOSE, CAPILLARY
Glucose-Capillary: 159 mg/dL — ABNORMAL HIGH (ref 70–99)
Glucose-Capillary: 157 mg/dL — ABNORMAL HIGH (ref 70–99)
Glucose-Capillary: 159 mg/dL — ABNORMAL HIGH (ref 70–99)
Glucose-Capillary: 152 mg/dL — ABNORMAL HIGH (ref 70–99)
Glucose-Capillary: 139 mg/dL — ABNORMAL HIGH (ref 70–99)
Glucose-Capillary: 146 mg/dL — ABNORMAL HIGH (ref 70–99)
Glucose-Capillary: 145 mg/dL — ABNORMAL HIGH (ref 70–99)
Glucose-Capillary: 142 mg/dL — ABNORMAL HIGH (ref 70–99)
Glucose-Capillary: 149 mg/dL — ABNORMAL HIGH (ref 70–99)
Glucose-Capillary: 151 mg/dL — ABNORMAL HIGH (ref 70–99)
Glucose-Capillary: 144 mg/dL — ABNORMAL HIGH (ref 70–99)
Glucose-Capillary: 163 mg/dL — ABNORMAL HIGH (ref 70–99)
Glucose-Capillary: 144 mg/dL — ABNORMAL HIGH (ref 70–99)
Glucose-Capillary: 150 mg/dL — ABNORMAL HIGH (ref 70–99)

## 2022-09-30 LAB — CBC WITH DIFFERENTIAL/PLATELET
Abs Immature Granulocytes: 0.69 10*3/uL — ABNORMAL HIGH (ref 0.00–0.07)
Basophils Absolute: 0.1 10*3/uL (ref 0.0–0.1)
Basophils Relative: 1 %
Eosinophils Absolute: 0.4 10*3/uL (ref 0.0–0.5)
Eosinophils Relative: 3 %
HCT: 24.2 % — ABNORMAL LOW (ref 39.0–52.0)
Hemoglobin: 7.6 g/dL — ABNORMAL LOW (ref 13.0–17.0)
Immature Granulocytes: 5 %
Lymphocytes Relative: 7 %
Lymphs Abs: 1 10*3/uL (ref 0.7–4.0)
MCH: 23.4 pg — ABNORMAL LOW (ref 26.0–34.0)
MCHC: 31.4 g/dL (ref 30.0–36.0)
MCV: 74.5 fL — ABNORMAL LOW (ref 80.0–100.0)
Monocytes Absolute: 2 10*3/uL — ABNORMAL HIGH (ref 0.1–1.0)
Monocytes Relative: 14 %
Neutro Abs: 10.6 10*3/uL — ABNORMAL HIGH (ref 1.7–7.7)
Neutrophils Relative %: 70 %
Platelets: 199 10*3/uL (ref 150–400)
RBC: 3.25 MIL/uL — ABNORMAL LOW (ref 4.22–5.81)
RDW: 15.3 % (ref 11.5–15.5)
WBC: 14.7 10*3/uL — ABNORMAL HIGH (ref 4.0–10.5)
nRBC: 0.5 % — ABNORMAL HIGH (ref 0.0–0.2)

## 2022-09-30 LAB — BASIC METABOLIC PANEL
Anion gap: 10 (ref 5–15)
BUN: 46 mg/dL — ABNORMAL HIGH (ref 8–23)
CO2: 22 mmol/L (ref 22–32)
Calcium: 8.4 mg/dL — ABNORMAL LOW (ref 8.9–10.3)
Chloride: 99 mmol/L (ref 98–111)
Creatinine, Ser: 4.61 mg/dL — ABNORMAL HIGH (ref 0.61–1.24)
GFR, Estimated: 13 mL/min — ABNORMAL LOW (ref >60)
Glucose, Bld: 150 mg/dL — ABNORMAL HIGH (ref 70–99)
Potassium: 3.4 mmol/L — ABNORMAL LOW (ref 3.5–5.1)
Sodium: 131 mmol/L — ABNORMAL LOW (ref 135–145)

## 2022-09-30 LAB — PROCALCITONIN: Procalcitonin: 20.55 ng/mL

## 2022-09-30 LAB — PHOSPHORUS: Phosphorus: 1.6 mg/dL — ABNORMAL LOW (ref 2.5–4.6)

## 2022-09-30 LAB — HEPARIN LEVEL (UNFRACTIONATED)
Heparin Unfractionated: <0.1 IU/mL — ABNORMAL LOW (ref 0.30–0.70)
Heparin Unfractionated: <0.1 IU/mL — ABNORMAL LOW (ref 0.30–0.70)

## 2022-09-30 LAB — TRIGLYCERIDES: Triglycerides: 178 mg/dL — ABNORMAL HIGH (ref <150)

## 2022-09-30 LAB — MAGNESIUM: Magnesium: 2.6 mg/dL — ABNORMAL HIGH (ref 1.7–2.4)

## 2022-09-30 LAB — BETA-HYDROXYBUTYRIC ACID: Beta-Hydroxybutyric Acid: 0.68 mmol/L — ABNORMAL HIGH (ref 0.05–0.27)

## 2022-09-30 MED ORDER — INSULIN DETEMIR 100 UNIT/ML ~~LOC~~ SOLN
20.0000 [IU] | SUBCUTANEOUS | Status: DC
  Administered 2022-09-30 – 2022-10-02 (×3): 20 [IU] via SUBCUTANEOUS
  Filled 2022-09-30 (×3): qty 0.2

## 2022-09-30 MED ORDER — POTASSIUM CHLORIDE 10 MEQ/50ML IV SOLN
10.0000 meq | INTRAVENOUS | Status: AC
  Administered 2022-09-30 (×2): 10 meq via INTRAVENOUS
  Filled 2022-09-30 (×2): qty 50

## 2022-09-30 MED ORDER — TRACE MINERALS CU-MN-SE-ZN 300-55-60-3000 MCG/ML IV SOLN
INTRAVENOUS | Status: AC
  Filled 2022-09-30: qty 757.47

## 2022-09-30 MED ORDER — INSULIN ASPART 100 UNIT/ML IJ SOLN: 0.0000 [IU] | INTRAMUSCULAR | Status: DC

## 2022-09-30 MED ORDER — INSULIN ASPART 100 UNIT/ML IJ SOLN
0.0000 [IU] | INTRAMUSCULAR | Status: DC
  Administered 2022-09-30 (×3): 3 [IU] via SUBCUTANEOUS
  Administered 2022-09-30 – 2022-10-01 (×2): 4 [IU] via SUBCUTANEOUS
  Administered 2022-10-01: 7 [IU] via SUBCUTANEOUS
  Administered 2022-10-01: 4 [IU] via SUBCUTANEOUS
  Administered 2022-10-01: 11 [IU] via SUBCUTANEOUS
  Administered 2022-10-01: 4 [IU] via SUBCUTANEOUS
  Administered 2022-10-02 (×2): 11 [IU] via SUBCUTANEOUS
  Administered 2022-10-02: 7 [IU] via SUBCUTANEOUS
  Administered 2022-10-02: 11 [IU] via SUBCUTANEOUS
  Administered 2022-10-02: 7 [IU] via SUBCUTANEOUS
  Administered 2022-10-03 (×2): 11 [IU] via SUBCUTANEOUS
  Administered 2022-10-03: 7 [IU] via SUBCUTANEOUS
  Administered 2022-10-03 (×3): 11 [IU] via SUBCUTANEOUS
  Administered 2022-10-03: 7 [IU] via SUBCUTANEOUS
  Administered 2022-10-04 (×2): 11 [IU] via SUBCUTANEOUS
  Administered 2022-10-04: 15 [IU] via SUBCUTANEOUS
  Administered 2022-10-04: 11 [IU] via SUBCUTANEOUS
  Administered 2022-10-04: 7 [IU] via SUBCUTANEOUS
  Administered 2022-10-04: 11 [IU] via SUBCUTANEOUS
  Administered 2022-10-05: 7 [IU] via SUBCUTANEOUS
  Administered 2022-10-05 (×3): 11 [IU] via SUBCUTANEOUS
  Administered 2022-10-05: 7 [IU] via SUBCUTANEOUS
  Administered 2022-10-06: 4 [IU] via SUBCUTANEOUS
  Administered 2022-10-06: 3 [IU] via SUBCUTANEOUS
  Administered 2022-10-07: 4 [IU] via SUBCUTANEOUS
  Administered 2022-10-07 (×2): 7 [IU] via SUBCUTANEOUS
  Administered 2022-10-07: 4 [IU] via SUBCUTANEOUS
  Administered 2022-10-07: 3 [IU] via SUBCUTANEOUS
  Administered 2022-10-07: 7 [IU] via SUBCUTANEOUS
  Administered 2022-10-08: 3 [IU] via SUBCUTANEOUS
  Administered 2022-10-08 (×3): 4 [IU] via SUBCUTANEOUS
  Administered 2022-10-08: 7 [IU] via SUBCUTANEOUS
  Administered 2022-10-09: 3 [IU] via SUBCUTANEOUS
  Administered 2022-10-09: 7 [IU] via SUBCUTANEOUS
  Administered 2022-10-09: 3 [IU] via SUBCUTANEOUS
  Administered 2022-10-09: 11 [IU] via SUBCUTANEOUS
  Administered 2022-10-09 (×2): 4 [IU] via SUBCUTANEOUS
  Administered 2022-10-09: 3 [IU] via SUBCUTANEOUS
  Administered 2022-10-10: 4 [IU] via SUBCUTANEOUS
  Administered 2022-10-10: 7 [IU] via SUBCUTANEOUS
  Filled 2022-09-30 (×55): qty 1

## 2022-09-30 MED ORDER — POTASSIUM PHOSPHATES 15 MMOLE/5ML IV SOLN
20.0000 mmol | Freq: Once | INTRAVENOUS | Status: DC
  Filled 2022-09-30: qty 6.67

## 2022-09-30 NOTE — Progress Notes (Signed)
[place] Hospital Day(s): 6.   Post op day(s): 2 Days Post-Op.   Interval History:  Patient seen and examined No acute events or new complaints overnight.  Patient now extubated and off vasopressor support  Some abdominal pain; no fever, chills, emesis Leukocytosis fluctuating but relatively stable; 14.7K Hgb stable to 7.6 Renal function still elevated; sCr - 4.61, BUN  - 46; UO - 135 ccs. Nephrology on board  Mild hypokalemia to 3.4; hypophosphatemia to 1.6 NGT in place; 300 ccs; bilious Surgical drain; 150 ccs; serosanguinous No stool from diverting ileostomy  Vital signs in last 24 hours: [min-max] current  Temp:  [97.7 F (36.5 C)-98.2 F (36.8 C)] 98.2 F (36.8 C) (10/25 0400) Pulse Rate:  [41-102] 97 (10/25 0600) Resp:  [16-34] 26 (10/25 0600) BP: (86-183)/(44-98) 147/67 (10/25 0600) SpO2:  [88 %-100 %] 99 % (10/25 0600) FiO2 (%):  [30 %-40 %] 30 % (10/24 2000) Weight:  [139.3 kg] 139.3 kg (10/25 0500)     Height: '6\' 2"'$  (188 cm) Weight: (!) 139.3 kg BMI (Calculated): 39.41   Intake/Output last 2 shifts:  10/24 0701 - 10/25 0700 In: 3614.9 [I.V.:3315.8; IV Piggyback:299.2] Out: 285 [Urine:135; Drains:150]   Physical Exam:  Constitutional: Alert; awake, NAD Respiratory: No respiratory distress; on [place] Cardiovascular: Atrial fibrillation; 100 bpm Gastrointestinal: Soft, he does not appear tender, non-distended, no rebound/guarding. Surgical drain with serosanguinous output. Diverting loop colostomy in right abdomen; no gas or stool in bag Genitourinary: Foley in place; urine clear  Integumentary: Midline laparotomy healing via secondary intention, no erythema   Labs:     Latest Ref Rng & Units 09/30/2022    4:13 AM 09/29/2022    1:06 AM 09/28/2022    6:07 PM  CBC  WBC 4.0 - 10.5 K/uL 14.7  13.4    Hemoglobin 13.0 - 17.0 g/dL 7.6  7.6  7.3   Hematocrit 39.0 - 52.0 % 24.2  25.4  25.1   Platelets 150 - 400 K/uL 199  254         Latest Ref Rng & Units 09/30/2022    4:13 AM 09/29/2022    8:42 PM 09/29/2022    3:09 PM  CMP  Glucose 70 - 99 mg/dL 150  191  153   BUN 8 - 23 mg/dL 46  44  40   Creatinine 0.61 - 1.24 mg/dL 4.61  4.33  4.12   Sodium 135 - 145 mmol/L 131  128  130   Potassium 3.5 - 5.1 mmol/L 3.4  3.6  3.8   Chloride 98 - 111 mmol/L 99  97  98   CO2 22 - 32 mmol/L '22  21  19   '$ Calcium 8.9 - 10.3 mg/dL 8.4  8.3  8.7      Imaging studies: No new pertinent imaging studies   Assessment/Plan: 66 y.o. male  2 Days Post-Op s/p exploratory laparotomy, abdominal washout, and diverting loop ileostomy for intra-abdominal purulent fluid after initial open right colectomy with open cholecystectomy for right colon cancer and cholelithiasis on 10/19   - Continue TPN; monitor and replete electrolytes; advance to goal when feasible. Anticipate likely ileus given intra-abdominal findings  - Continue IV Abx (Zosyn)  - Continue foley catheter for UP monitoring; worsening AKI/ATN; nephrology consult pending  - Monitor abdominal examination; on-going ileostomy function  - Midline wound care: Pack daily with saline moistened gauze, cover, secure  - Continue surgical drain; monitor and record output  - Pain control  prn; antiemetics prn  All of the above findings and recommendations were discussed with the patient, and the medical team.  -- Edison Simon, PA-C [facility] 09/30/2022, 7:31 AM M-F: 7am - 4pm

## 2022-09-30 NOTE — Evaluation (Signed)
Occupational Therapy Evaluation Patient Details Name: Nicolas Zuniga MRN: [id number] DOB: 1956/04/16 Today's Date: 09/30/2022   History of Present Illness 66 y.o. male  2 Days Post-Op s/p exploratory laparotomy, abdominal washout, and diverting loop ileostomy for intra-abdominal purulent fluid after initial open right colectomy with open cholecystectomy for right colon cancer and cholelithiasis on 10/19   Clinical Impression   Mr Scalisi was seen for OT evaluation this date. Prior to hospital admission, pt was IND for I/ADLs and mobility. Pt lives with spouse in home c 3 STE. Pt presents to acute OT demonstrating impaired ADL performance and functional mobility 2/2 decreased activity tolerance and functional strength/ROM/balance deficits. Pt currently requires MAX A don B socks bed level. MIN A log rolling to exit bed, assist for trunk. MIN A seated grooming tasks, requires BUE support static sitting.   MIN A lateral scoot along EOB. Pt tolerated ~20 min sitting EOB including seated therex as described below. Reviewed IS. MAX A x2 sit>sup - increased abdominal pain 5/10, RN notified. Pt would benefit from skilled OT to address noted impairments and functional limitations (see below for any additional details). Upon hospital discharge, recommend AIR to maximize pt safety and return to PLOF.    Recommendations for follow up therapy are one component of a multi-disciplinary discharge planning process, led by the attending physician.  Recommendations may be updated based on patient status, additional functional criteria and insurance authorization.   Follow Up Recommendations  Acute inpatient rehab (3hours/day)    Assistance Recommended at Discharge Frequent or constant Supervision/Assistance  Patient can return home with the following Two people to help with walking and/or transfers;Two people to help with bathing/dressing/bathroom;Help with stairs or ramp for entrance    Functional Status  Assessment  Patient has had a recent decline in their functional status and demonstrates the ability to make significant improvements in function in a reasonable and predictable amount of time.  Equipment Recommendations  Other (comment) (defer to next venue of care)    Recommendations for Other Services       Precautions / Restrictions Precautions Precautions: Fall Restrictions Weight Bearing Restrictions: No      Mobility Bed Mobility Overal bed mobility: Needs Assistance Bed Mobility: Supine to Sit, Sit to Supine     Supine to sit: Min assist Sit to supine: Max assist, +2 for physical assistance   General bed mobility comments: assist for trunk sitting up. +2 return to bed for abdominal pain    Transfers Overall transfer level: Needs assistance   Transfers: Bed to chair/wheelchair/BSC            Lateral/Scoot Transfers: Min assist General transfer comment: small scoots along EOB      Balance Overall balance assessment: Needs assistance Sitting-balance support: Single extremity supported, Feet supported Sitting balance-Leahy Scale: Poor Sitting balance - Comments: posterior lean Postural control: Posterior lean                                 ADL either performed or assessed with clinical judgement   ADL Overall ADL's : Needs assistance/impaired                                       General ADL Comments: MAX A don B socks bed level. MIN A seated grooming tasks, requires BUE support static sitting.  Pertinent Vitals/Pain Pain Assessment Pain Assessment: 0-10 Pain Score: 5  Pain Location: abdomen Pain Descriptors / Indicators: Discomfort, Operative site guarding Pain Intervention(s): Limited activity within patient's tolerance, Patient requesting pain meds-RN notified     Hand Dominance     Extremity/Trunk Assessment Upper Extremity Assessment Upper Extremity Assessment: Generalized weakness   Lower Extremity  Assessment Lower Extremity Assessment: Generalized weakness       Communication Communication Communication: No difficulties   Cognition Arousal/Alertness: Awake/alert Behavior During Therapy: WFL for tasks assessed/performed Overall Cognitive Status: Within Functional Limits for tasks assessed                                                  Home Living Family/patient expects to be discharged to:: Private residence Living Arrangements: Spouse/significant other Available Help at Discharge: Family (wife works from home) Type of Home: House Home Access: Stairs to enter Technical brewer of Steps: 3 Entrance Stairs-Rails: Right Home Layout: One level     Bathroom Shower/Tub: Chief Strategy Officer: None          Prior Functioning/Environment Prior Level of Function : Independent/Modified Independent;Working/employed                        OT Problem List: Decreased strength;Decreased range of motion;Decreased activity tolerance;Impaired balance (sitting and/or standing);Decreased safety awareness      OT Treatment/Interventions: Self-care/ADL training;Therapeutic exercise;DME and/or AE instruction;Energy conservation;Therapeutic activities;Patient/family education;Balance training    OT Goals(Current goals can be found in the care plan section) Acute Rehab OT Goals Patient Stated Goal: to return to PLOF OT Goal Formulation: With patient Time For Goal Achievement: 10/14/22 Potential to Achieve Goals: Good ADL Goals Pt Will Perform Grooming: with min assist;standing Pt Will Perform Lower Body Dressing: with min assist;sit to/from stand;with caregiver independent in assisting Pt Will Transfer to Toilet: with min assist;ambulating;bedside commode  OT Frequency: Min 3X/week    Co-evaluation              AM-PAC OT "6 Clicks" Daily Activity     Outcome Measure Help from another person eating meals?: Total  (NPO) Help from another person taking care of personal grooming?: A Little Help from another person toileting, which includes using toliet, bedpan, or urinal?: A Lot Help from another person bathing (including washing, rinsing, drying)?: A Lot Help from another person to put on and taking off regular upper body clothing?: A Lot Help from another person to put on and taking off regular lower body clothing?: A Lot 6 Click Score: 12   End of Session Nurse Communication: Mobility status  Activity Tolerance: Patient tolerated treatment well Patient left: in bed;with call bell/phone within reach  OT Visit Diagnosis: Other abnormalities of gait and mobility (R26.89);Muscle weakness (generalized) (M62.81)                Time: 5102-5852 OT Time Calculation (min): 39 min Charges:  OT General Charges $OT Visit: 1 Visit OT Evaluation $OT Eval High Complexity: 1 High OT Treatments $Self Care/Home Management : 8-22 mins $Therapeutic Exercise: 8-22 mins  Dessie Coma, M.S. OTR/L  09/30/22, 4:26 PM  ascom [phone]

## 2022-09-30 NOTE — Progress Notes (Signed)
[place] NOTE       Patient ID: Nicolas Zuniga MRN: [id number] DOB/AGE: 07/21/1956 66 y.o.  Admit date: 09/24/2022 Referring Physician Sharion Settler, NP  Primary Physician Dr. Hortencia Pilar ([facility])  Primary Cardiologist Nehemiah Massed Reason for Consultation Af RVR  HPI: Nicolas Zuniga is a 66LKG with a PMH of paroxysmal atrial fibrillation (on flecainide, metoprolol, and Eliquis), HFpEF (EF >55, G1 DD 04/2022), type II diabetes, hypertension, recently diagnosed adenocarcinoma of the colon who presented to [facility] 09/24/2022 for and elective laparoscopic right colectomy ultimately converted to open right colectomy and cholecystectomy.  Postoperative course was complicated by hypotension and bradycardia the evening of postop day 1, concerning for beta-blocker toxicity for which he was started on glucagon infusion.  Rapid response was called overnight on 10/21 for atrial fibrillation with RVR and questionable VT, was started on amiodarone and diltiazem infusions with some heart rate response.  On 10/23 he underwent exploratory laparotomy, abdominal washout, and diverting loop ileostomy for intra-abdominal purulent fluid seen on CT abdomen pelvis.  Postoperatively, he remained intubated and requiring vasopressor support.  He converted to atrial flutter with variable conduction the evening of 10/23.  Interval history: -Extubated yesterday evening, now on 3 L nasal cannula.  -Still n.p.o. with NG tube per surgery -Off vasopressor support entirely, anion gap closed, making some urine today. Nephrology following.  -Remains in atrial fibrillation versus flutter rate controlled in the 80s-low 100s while awake today, on amiodarone infusion only for rate and rhythm control -feels better overall today, happy to not be on the ventilator. Has some expected abdominal soreness, no chest pain, shortness of breath, or palpitations -echo resulted with preserved LVEF without significant  valvular pathologies, no RWMA's   Past Medical History:  Diagnosis Date   Adenocarcinoma of colon ([place]) 09/08/2022   Cholelithiasis    Chronic gastritis    Colon polyps    Coronary artery calcification seen on CT scan    Diastolic dysfunction    a.) TTE 04/09/2022: EF >55%, triv TR, G1DD   Diverticulosis    Hypertension    Long term current use of anticoagulant    a.) apixaban   Nephrolithiasis    Paroxysmal A-fib (Nicolas Zuniga)    a.) CHA2DS2VASc = 3 (age, HTN, T2DM);  b.) s/p DCCV 07/09/2016 (100 J x1), 12/22/2018 (120 J x1), 08/18/2021 (120 J x1), 03/11/2022 (120 J x1), 03/31/2022 (120 J x1); c.) rate/rhythm maintained on oral flecanide + metoprolol succinate; chronically anticoagulated with apixaban   T2DM (type 2 diabetes mellitus) ([place])     Past Surgical History:  Procedure Laterality Date   CARDIOVERSION N/A 12/22/2018   Procedure: CARDIOVERSION (CATH LAB);  Surgeon: Corey Skains, MD;  Location: [facility] ORS;  Service: Cardiovascular;  Laterality: N/A;   CARDIOVERSION N/A 08/18/2021   Procedure: CARDIOVERSION;  Surgeon: Corey Skains, MD;  Location: [facility] ORS;  Service: Cardiovascular;  Laterality: N/A;   CARDIOVERSION N/A 03/11/2022   Procedure: CARDIOVERSION;  Surgeon: Corey Skains, MD;  Location: [facility] ORS;  Service: Cardiovascular;  Laterality: N/A;   CARDIOVERSION N/A 03/31/2022   Procedure: CARDIOVERSION;  Surgeon: Corey Skains, MD;  Location: [facility] ORS;  Service: Cardiovascular;  Laterality: N/A;   CHOLECYSTECTOMY N/A 09/24/2022   Procedure: LAPAROSCOPIC CHOLECYSTECTOMY;  Surgeon: Jules Husbands, MD;  Location: [facility] ORS;  Service: General;  Laterality: N/A;   COLONOSCOPY WITH PROPOFOL N/A 09/08/2022   Procedure: COLONOSCOPY WITH PROPOFOL;  Surgeon: Lucilla Lame, MD;  Location: [facility] ENDOSCOPY;  Service: Endoscopy;  Laterality: N/A;   ELECTROPHYSIOLOGIC STUDY N/A 07/09/2016   Procedure: CARDIOVERSION;  Surgeon: Dionisio David, MD;  Location: [facility] ORS;  Service:  Cardiovascular;  Laterality: N/A;   LAPAROSCOPIC RIGHT COLECTOMY N/A 09/24/2022   Procedure: LAPAROSCOPIC RIGHT COLECTOMY, RNFA to assist;  Surgeon: Jules Husbands, MD;  Location: [facility] ORS;  Service: General;  Laterality: N/A;   LAPAROTOMY N/A 09/28/2022   Procedure: EXPLORATORY LAPAROTOMY WITH OSTOMY CREATION;  Surgeon: Jules Husbands, MD;  Location: [facility] ORS;  Service: General;  Laterality: N/A;   TEE WITHOUT CARDIOVERSION N/A 07/09/2016   Procedure: TRANSESOPHAGEAL ECHOCARDIOGRAM (TEE);  Surgeon: Dionisio David, MD;  Location: [facility] ORS;  Service: Cardiovascular;  Laterality: N/A;   web fingers repaired     as a child    Medications Prior to Admission  Medication Sig Dispense Refill Last Dose   bisacodyl (DULCOLAX) 5 MG EC tablet Take all 4 tablets at 8 am the morning prior to your surgery. 4 tablet 0 09/23/2022   Cholecalciferol (VITAMIN D) 50 MCG (2000 UT) CAPS Take 2,000 Units by mouth daily.   Past Week   flecainide (TAMBOCOR) 100 MG tablet Take 100 mg by mouth 2 (two) times daily.   09/24/2022   glipiZIDE (GLUCOTROL XL) 10 MG 24 hr tablet Take 10 mg by mouth 2 (two) times daily.    09/24/2022   metFORMIN (GLUCOPHAGE-XR) 500 MG 24 hr tablet Take 500 mg by mouth daily with breakfast.   Past Month   metoprolol (LOPRESSOR) 100 MG tablet Take 100 mg by mouth 2 (two) times daily.   09/24/2022   metroNIDAZOLE (FLAGYL) 500 MG tablet Take 2 tablets at 8AM, take 2 tablets at [facility], and take 2 tablets at 8PM the day prior to your surgery 6 tablet 0 09/23/2022   Multiple Vitamins-Minerals (MULTIVITAMIN WITH MINERALS) tablet Take 1 tablet by mouth daily with breakfast.    Past Week   neomycin (MYCIFRADIN) 500 MG tablet Take 2 tablet at 8am, take 2 tablets at 2pm, and take 2 tablets at 8pm the day prior to your surgery 6 tablet 0 09/23/2022   Omega-3 Fatty Acids (FISH OIL) 1000 MG CAPS Take 1,000 mg by mouth daily.   Past Week   OZEMPIC, 1 MG/DOSE, 4 MG/3ML SOPN Inject 1 mg into the skin every  Wednesday. Patient stopped taking these medications   Past Month   polyethylene glycol powder (MIRALAX) 17 GM/SCOOP powder Mix full container in 64 ounces of Gatorade or other clear liquid. NO Red 238 g 0 09/23/2022   Turmeric 400 MG CAPS Take 400 mg by mouth daily.   Past Week   vitamin B-12 (CYANOCOBALAMIN) 100 MCG tablet Take 500 mcg by mouth daily.   Past Week   Vitamin E 400 units TABS Take 400 Units by mouth daily.   Past Week   apixaban (ELIQUIS) 5 MG TABS tablet Take 5 mg by mouth 2 (two) times daily.   09/21/2022   Coenzyme Q10 (COQ10) 100 MG CAPS Take 100 mg by mouth daily.   09/21/2022   Social History   Socioeconomic History   Marital status: Married    Spouse name: Not on file   Number of children: Not on file   Years of education: Not on file   Highest education level: Not on file  Occupational History   Not on file  Tobacco Use   Smoking status: Never    Passive exposure: Never   Smokeless tobacco: Never  Vaping Use   Vaping Use: Never  used  Substance and Sexual Activity   Alcohol use: No   Drug use: No   Sexual activity: Not on file  Other Topics Concern   Not on file  Social History Narrative   Lives with wife, [name], with on pet, cat.   Social Determinants of Health   Financial Resource Strain: Not on file  Food Insecurity: No Food Insecurity (09/24/2022)   Hunger Vital Sign    Worried About Running Out of Food in the Last Year: Never true    Ran Out of Food in the Last Year: Never true  Transportation Needs: No Transportation Needs (09/24/2022)   PRAPARE - Hydrologist (Medical): No    Lack of Transportation (Non-Medical): No  Physical Activity: Not on file  Stress: Not on file  Social Connections: Not on file  Intimate Partner Violence: Not At Risk (09/24/2022)   Humiliation, Afraid, Rape, and Kick questionnaire    Fear of Current or Ex-Partner: No    Emotionally Abused: No    Physically Abused: No    Sexually  Abused: No    Family History  Problem Relation Age of Onset   Varicose Veins Mother    Heart disease Mother    Diabetes Mother    COPD Father      Vitals:   09/30/22 0800 09/30/22 0900 09/30/22 1000 09/30/22 1100  BP: (!) 145/63 (!) 151/71 (!) 141/68 (!) 134/59  Pulse: 93 100 (!) 104 90  Resp: (!) 27 (!) 27 (!) 25 (!) 27  Temp: 98 F (36.7 C)     TempSrc: Oral     SpO2: 100% 100% 100% 100%  Weight:      Height:        PHYSICAL EXAM General: Pleasant acutely ill-appearing middle-aged Caucasian male, sitting upright in ICU bed, wife at bedside. HEENT:  Normocephalic and atraumatic. NGT present left nare Neck:  No JVD.  Lungs: Normal respiratory effort on oxygen by nasal cannula, some coarseness to auscultation anteriorly.   Heart: irregularly irregular rhythm with controlled rate. Normal S1 and S2 without gallops or murmurs.  Abdomen: Ileostomy present RUQ with trace dark-colored fluid in ostomy bag.  Dry dressing in place over midline incision. Msk: Normal strength and tone for age. Extremities: Warm and well perfused. No clubbing, cyanosis.  Trace peripheral and upper extremity edema. SCDs Neuro: Alert and oriented X 3.   Psych: Appropriate for situation  Labs: Basic Metabolic Panel: Recent Labs    09/29/22 0106 09/29/22 1041 09/29/22 2042 09/30/22 0413  NA 130*   < > 128* 131*  K 4.1   < > 3.6 3.4*  CL 94*   < > 97* 99  CO2 16*   < > 21* 22  GLUCOSE 324*   < > 191* 150*  BUN 29*   < > 44* 46*  CREATININE 2.97*   < > 4.33* 4.61*  CALCIUM 7.5*   < > 8.3* 8.4*  MG 1.8  --   --  2.6*  PHOS 3.1  --   --  1.6*   < > = values in this interval not displayed.    Liver Function Tests: Recent Labs    09/28/22 1449  AST 22  ALT 10  ALKPHOS 50  BILITOT 0.8  PROT 4.4*  ALBUMIN 2.2*    No results for input(s): "LIPASE", "AMYLASE" in the last 72 hours. CBC: Recent Labs    09/29/22 0106 09/30/22 0413  WBC 13.4* 14.7*  NEUTROABS 9.0*  10.6*  HGB 7.6* 7.6*   HCT 25.4* 24.2*  MCV 78.9* 74.5*  PLT 254 199    Cardiac Enzymes: Recent Labs    09/28/22 1945 09/28/22 2232 09/29/22 0106  TROPONINIHS 80* 109* 70*    BNP: Recent Labs    09/29/22 1025  BNP 476.6*   D-Dimer: No results for input(s): "DDIMER" in the last 72 hours. Hemoglobin A1C: No results for input(s): "HGBA1C" in the last 72 hours. Fasting Lipid Panel: Recent Labs    09/30/22 0413  TRIG 178*   Thyroid Function Tests: No results for input(s): "TSH", "T4TOTAL", "T3FREE", "THYROIDAB" in the last 72 hours.  Invalid input(s): "FREET3"  Anemia Panel: No results for input(s): "VITAMINB12", "FOLATE", "FERRITIN", "TIBC", "IRON", "RETICCTPCT" in the last 72 hours.   Radiology: ECHOCARDIOGRAM COMPLETE  Result Date: 09/29/2022    ECHOCARDIOGRAM REPORT   Patient Name:   TREQUAN MARSOLEK Date of Exam: 09/29/2022 Medical Rec #:  [id number]     Height:       74.0 in Accession #:    [id number]    Weight:       260.1 lb Date of Birth:  [birth year]      BSA:          2.430 m Patient Age:    29 years      BP:           126/74 mmHg Patient Gender: M             HR:           83 bpm. Exam Location:  [facility] Procedure: 2D Echo, Color Doppler and Cardiac Doppler Indications:     Abnormal ECG R94.31  History:         Patient has prior history of Echocardiogram examinations, most                  recent 07/09/2016. Risk Factors:Hypertension and Diabetes.                  Paroxysmal Afib.  Sonographer:     Sherrie Sport Referring Phys:  [id number] Teressa Lower Diagnosing Phys: Serafina Royals MD  Sonographer Comments: Echo performed with patient supine and on artificial respirator, Technically challenging study due to limited acoustic windows and no subcostal window. IMPRESSIONS  1. Left ventricular ejection fraction, by estimation, is 65 to 70%. The left ventricle has normal function. The left ventricle has no regional wall motion abnormalities. Left ventricular diastolic parameters were normal.  2. Right  ventricular systolic function is normal. The right ventricular size is normal.  3. The mitral valve is normal in structure. Trivial mitral valve regurgitation.  4. The aortic valve is normal in structure. Aortic valve regurgitation is not visualized. FINDINGS  Left Ventricle: Left ventricular ejection fraction, by estimation, is 65 to 70%. The left ventricle has normal function. The left ventricle has no regional wall motion abnormalities. The left ventricular internal cavity size was small. There is no left ventricular hypertrophy. Left ventricular diastolic parameters were normal. Right Ventricle: The right ventricular size is normal. No increase in right ventricular wall thickness. Right ventricular systolic function is normal. Left Atrium: Left atrial size was normal in size. Right Atrium: Right atrial size was normal in size. Pericardium: There is no evidence of pericardial effusion. Mitral Valve: The mitral valve is normal in structure. Trivial mitral valve regurgitation. Tricuspid Valve: The tricuspid valve is normal in structure. Tricuspid valve regurgitation is trivial. Aortic Valve: The aortic valve is normal  in structure. Aortic valve regurgitation is not visualized. Aortic valve mean gradient measures 2.0 mmHg. Aortic valve peak gradient measures 3.4 mmHg. Aortic valve area, by VTI measures 3.50 cm. Pulmonic Valve: The pulmonic valve was normal in structure. Pulmonic valve regurgitation is not visualized. Aorta: The aortic root and ascending aorta are structurally normal, with no evidence of dilitation. IAS/Shunts: No atrial level shunt detected by color flow Doppler.  LEFT VENTRICLE PLAX 2D LVIDd:         4.60 cm LVIDs:         3.30 cm LV PW:         1.20 cm LV IVS:        0.80 cm LVOT diam:     2.00 cm LV SV:         43 LV SV Index:   18 LVOT Area:     3.14 cm  RIGHT VENTRICLE RV Basal diam:  4.10 cm RV Mid diam:    3.80 cm RV S prime:     11.40 cm/s TAPSE (M-mode): 2.0 cm LEFT ATRIUM           Index         RIGHT ATRIUM           Index LA diam:      3.10 cm 1.28 cm/m   RA Area:     14.80 cm LA Vol (A2C): 40.0 ml 16.46 ml/m  RA Volume:   37.20 ml  15.31 ml/m LA Vol (A4C): 70.1 ml 28.84 ml/m  AORTIC VALVE AV Area (Vmax):    2.22 cm AV Area (Vmean):   2.37 cm AV Area (VTI):     3.50 cm AV Vmax:           91.60 cm/s AV Vmean:          67.100 cm/s AV VTI:            0.122 m AV Peak Grad:      3.4 mmHg AV Mean Grad:      2.0 mmHg LVOT Vmax:         64.70 cm/s LVOT Vmean:        50.700 cm/s LVOT VTI:          0.136 m LVOT/AV VTI ratio: 1.11  AORTA Ao Root diam: 3.20 cm MITRAL VALVE               TRICUSPID VALVE MV Area (PHT): 3.40 cm    TR Peak grad:   19.4 mmHg MV Decel Time: 223 msec    TR Vmax:        220.00 cm/s MV E velocity: 85.90 cm/s                            SHUNTS                            Systemic VTI:  0.14 m                            Systemic Diam: 2.00 cm Serafina Royals MD Electronically signed by Serafina Royals MD Signature Date/Time: 09/29/2022/12:21:30 PM    Final    DG Abd 1 View  Result Date: 09/28/2022 CLINICAL DATA:  Enteric catheter placement, postop EXAM: ABDOMEN - 1 VIEW COMPARISON:  09/26/2022 FINDINGS: Frontal view of the lower chest and upper abdomen demonstrates  enteric catheter passing below diaphragm, tip projecting over the gastric antrum. Left basilar consolidation compatible with atelectasis. Bowel gas pattern is unremarkable. IMPRESSION: 1. Enteric catheter tip projecting over the gastric antrum. Electronically Signed   By: Randa Ngo M.D.   On: 09/28/2022 15:52   DG Chest Port 1 View  Result Date: 09/28/2022 CLINICAL DATA:  Postop, intubated EXAM: PORTABLE CHEST 1 VIEW COMPARISON:  09/27/2022 FINDINGS: Single frontal view of the chest demonstrates endotracheal tube overlying tracheal air column tip at level of thoracic inlet. Enteric catheter passes below diaphragm tip excluded by collimation. Right-sided PICC tip overlies the right atrium. Numerous cardiac  leads overlie the central chest. The cardiac silhouette is unremarkable. Lung volumes are diminished, with left basilar consolidation most consistent with atelectasis. Trace left pleural effusion. No pneumothorax. No acute bony abnormalities. IMPRESSION: 1. Support devices as above. 2. Increasing left basilar consolidation, favor atelectasis over pneumonia. 3. Trace left pleural effusion. Electronically Signed   By: Randa Ngo M.D.   On: 09/28/2022 15:51   CT ABDOMEN PELVIS WO CONTRAST  Result Date: 09/28/2022 CLINICAL DATA:  Status post right hemicolectomy 09/24/2022. Anastomotic complication suspected. EXAM: CT ABDOMEN AND PELVIS WITHOUT CONTRAST TECHNIQUE: Multidetector CT imaging of the abdomen and pelvis was performed following the standard protocol without IV contrast. RADIATION DOSE REDUCTION: This exam was performed according to the departmental dose-optimization program which includes automated exposure control, adjustment of the mA and/or kV according to patient size and/or use of iterative reconstruction technique. COMPARISON:  09/27/2022 FINDINGS: Lower chest: Bibasilar atelectasis noted with tiny bilateral pleural effusions. Hepatobiliary: No suspicious focal abnormality in the liver on this study without intravenous contrast. Gallbladder is surgically absent. No intrahepatic or extrahepatic biliary dilation. Pancreas: No focal mass lesion. No dilatation of the main duct. No intraparenchymal cyst. No peripancreatic edema. Spleen: No splenomegaly. No focal mass lesion. Adrenals/Urinary Tract: No adrenal nodule or mass. Contrast in the kidneys compatible with residual from yesterday's infuse scan. Small cyst noted left kidney. No evidence for hydroureter. The urinary bladder appears normal for the degree of distention. Stomach/Bowel: Stomach is distended with gas and fluid. Duodenum is normally positioned as is the ligament of Treitz. No small bowel wall thickening. No small bowel dilatation. The  distal ileum at the ileocolic anastomosis in the proximal transverse colon at the anastomosis is well opacified with enteric contrast material. While there is no evidence for gross contrast extravasation at the level of the anastomosis, the small fluid collection identified on yesterday's study has increased in attenuation in the interval (26 Hounsfield units previously versus 67 Hounsfield units today). This increase in attenuation is concerning for some trace spill of contrast from the anastomosis into the collection although blood products or infectious debris could cause increase in attenuation. This finding is associated with a slight increase in fluid volume in the right paracolic gutter. Diverticular changes are seen in the left colon as before. Vascular/Lymphatic: There is mild atherosclerotic calcification of the abdominal aorta without aneurysm. There is no gastrohepatic or hepatoduodenal ligament lymphadenopathy. No retroperitoneal or mesenteric lymphadenopathy. No pelvic sidewall lymphadenopathy. Reproductive: The hypoattenuating collection seen previously in the prostate gland are less prominent today. Other: Free fluid and free gas in the peritoneal cavity is again noted. Free gas is not unexpected 4 days out from surgery. As noted above, the volume of fluid in the right paracolic gutter is slightly increased in the interval. Musculoskeletal: Gas in the midline subcutaneous tissues deep to the staple line has increased in the interval  No worrisome lytic or sclerotic osseous abnormality. IMPRESSION: 1. While there is no evidence for gross enteric contrast extravasation at the level of the ileocolic anastomosis, the small fluid collection adjacent to the anastomosis identified on yesterday's study has increased in attenuation in the interval. This increase in attenuation is concerning for some trace spill of contrast from the anastomosis into the collection. 2. Slight increase in fluid volume in the  right paracolic gutter with similar fluid volume around the liver and in the left paracolic gutter. Collections of interloop mesenteric fluid are not substantially changed. 3. Free gas in the peritoneal cavity is not unexpected 4 days out from surgery. 4. Gas in the midline subcutaneous tissues deep to the staple line has increased in the interval. 5. Contrast in the kidneys compatible with residual from yesterday's infused scan. 6. Bibasilar atelectasis with tiny bilateral pleural effusions. 7. Aortic Atherosclerosis (ICD10-I70.0). Findings were Dr. Dahlia Byes at approximately 1105 hours on 09/28/2022. Electronically Signed   By: Misty Stanley M.D.   On: 09/28/2022 11:22   DG Chest Port 1 View  Result Date: 09/27/2022 CLINICAL DATA:  PICC line placement. EXAM: PORTABLE CHEST 1 VIEW COMPARISON:  Abdominal series 09/26/2022. CT abdomen and pelvis 09/27/2022 FINDINGS: Shallow inspiration with atelectasis in the lung bases. Cardiac enlargement. No airspace disease or consolidation in the lungs. A right PICC line has been placed with tip over the cavoatrial junction. No pneumothorax. Pneumoperitoneum is demonstrated below the right hemidiaphragm as seen on prior studies, likely postoperative. IMPRESSION: 1. Right PICC line appears in satisfactory position. 2. Shallow inspiration with atelectasis in the lung bases. Cardiac enlargement. 3. Pneumoperitoneum as seen on prior studies, probably postoperative. Electronically Signed   By: Lucienne Capers M.D.   On: 09/27/2022 19:25   [place] EKG SITE RITE  Result Date: 09/27/2022 If Site Rite image not attached, placement could not be confirmed due to current cardiac rhythm.  CT ABDOMEN PELVIS W CONTRAST  Result Date: 09/27/2022 CLINICAL DATA:  66 year old male with RIGHT colectomy for adenocarcinoma and cholecystectomy performed on 09/24/2022. Hypotension and abdominal discomfort. EXAM: CT ABDOMEN AND PELVIS WITH CONTRAST TECHNIQUE: Multidetector CT imaging of the  abdomen and pelvis was performed using the standard protocol following bolus administration of intravenous contrast. RADIATION DOSE REDUCTION: This exam was performed according to the departmental dose-optimization program which includes automated exposure control, adjustment of the mA and/or kV according to patient size and/or use of iterative reconstruction technique. CONTRAST:  171m OMNIPAQUE IOHEXOL 350 MG/ML SOLN COMPARISON:  09/17/2022 CT FINDINGS: Lower chest: Trace bilateral pleural effusions and bibasilar atelectasis noted. Hepatobiliary: The liver is unremarkable. The patient is status post cholecystectomy. There is no evidence of intrahepatic or extrahepatic biliary dilatation. Pancreas: A small amount of ill-defined fluid anterior to the pancreas is noted but the remainder of the pancreas is unremarkable. Spleen: Unremarkable Adrenals/Urinary Tract: No significant abnormalities of the kidneys, adrenal glands or bladder noted. No evidence of hydronephrosis. Stomach/Bowel: RIGHT colectomy and ileo colonic anastomosis noted. There is a small amount of ill-defined high density fluid along the suture line (images 35-39: Series 4), which may represent a small amount of extravasated contrast/leak. There is a small amount of ascites within the abdomen and pelvis with small to moderate amount of pneumoperitoneum within the abdomen. There is no evidence of focal collection/defined abscess. There is no evidence of bowel obstruction. No definite bowel wall thickening is identified. Vascular/Lymphatic: Aortic atherosclerosis. No enlarged abdominal or pelvic lymph nodes. Reproductive: 2 low-density areas within the prostate gland are  noted, measuring 1.7 cm on the LEFT and 1 cm on the RIGHT. Other: Ill-defined stranding within the abdomen and pelvis noted likely recent surgical changes. Musculoskeletal: No acute or suspicious bony abnormalities are identified. IMPRESSION: 1. Ill-defined slightly high density fluid  along the ileocolic suture line, which may represent a small amount of extravasated contrast/leak. Small to moderate amount of pneumoperitoneum and small amount of ascites within the abdomen and pelvis, not unexpected given surgery but nonspecific. No evidence of focal collection/defined abscess. 2. Trace bilateral pleural effusions and bibasilar atelectasis. 3. 2 low-density areas within the prostate gland, nonspecific but correlate with PSA. 4.  Aortic Atherosclerosis (ICD10-I70.0). Critical Value/emergent results discussed by phone at the time of interpretation on 09/27/2022 at 1:50 pm to provider DIEGO PABON , who verbally acknowledged these results. Electronically Signed   By: Margarette Canada M.D.   On: 09/27/2022 13:51   CT Angio Chest Pulmonary Embolism (PE) W or WO Contrast  Result Date: 09/27/2022 CLINICAL DATA:  Shortness of breath. EXAM: CT ANGIOGRAPHY CHEST WITH CONTRAST TECHNIQUE: Multidetector CT imaging of the chest was performed using the standard protocol during bolus administration of intravenous contrast. Multiplanar CT image reconstructions and MIPs were obtained to evaluate the vascular anatomy. RADIATION DOSE REDUCTION: This exam was performed according to the departmental dose-optimization program which includes automated exposure control, adjustment of the mA and/or kV according to patient size and/or use of iterative reconstruction technique. CONTRAST:  190m OMNIPAQUE IOHEXOL 350 MG/ML SOLN COMPARISON:  September 17, 2022. FINDINGS: Cardiovascular: Satisfactory opacification of the pulmonary arteries to the segmental level. No evidence of pulmonary embolism. Normal heart size. No pericardial effusion. Mediastinum/Nodes: No enlarged mediastinal, hilar, or axillary lymph nodes. Thyroid gland, trachea, and esophagus demonstrate no significant findings. Lungs/Pleura: No pneumothorax is noted. Minimal bilateral pleural effusions are noted with adjacent subsegmental atelectasis. Upper Abdomen:  Pneumoperitoneum is noted in the visualized portion of upper abdomen consistent with the reported history of recent laparoscopic right colectomy and cholecystectomy. Musculoskeletal: No chest wall abnormality. No acute or significant osseous findings. Review of the MIP images confirms the above findings. IMPRESSION: No definite evidence of pulmonary embolus. Minimal bilateral pleural effusions are noted with adjacent subsegmental atelectasis. Pneumoperitoneum is noted in visualized portion of upper abdomen consistent with reported history of recent laparoscopic right colectomy and cholecystectomy. Electronically Signed   By: JMarijo ConceptionM.D.   On: 09/27/2022 13:38   DG ABD ACUTE 2+V W 1V CHEST  Result Date: 09/26/2022 CLINICAL DATA:  Ileus following open right colectomy on 09/24/2022 EXAM: DG ABDOMEN ACUTE WITH 1 VIEW CHEST COMPARISON:  09/17/2022 FINDINGS: Gaseous distension of the stomach. Air is seen within the colon. No dilated loops of small bowel. Small-moderate volume pneumoperitoneum. Numerous surgical clips within the right hemiabdomen. Midline abdominal skin staples. Heart size within normal limits. Low lung volumes. Trace left pleural effusion with left basilar atelectasis. No pneumothorax. IMPRESSION: 1. Small-moderate volume pneumoperitoneum, likely related to recent surgery. 2. Gaseous distension of the stomach. No dilated loops of small bowel to suggest obstruction. Electronically Signed   By: NDavina PokeD.O.   On: 09/26/2022 10:57   CT Chest W Contrast  Result Date: 09/18/2022 CLINICAL DATA:  Colon cancer diagnosed on 09/08/2022. Elevated CEA. Staging evaluation. EXAM: CT CHEST, ABDOMEN, AND PELVIS WITH CONTRAST TECHNIQUE: Multidetector CT imaging of the chest, abdomen and pelvis was performed following the standard protocol during bolus administration of intravenous contrast. RADIATION DOSE REDUCTION: This exam was performed according to the departmental dose-optimization program  which includes automated exposure control, adjustment of the mA and/or kV according to patient size and/or use of iterative reconstruction technique. CONTRAST:  134m OMNIPAQUE IOHEXOL 300 MG/ML  SOLN COMPARISON:  Abdomen and pelvis CT dated 03/07/2020 FINDINGS: CT CHEST FINDINGS Cardiovascular: Minimal coronary artery calcification. Normal sized heart. Mediastinum/Nodes: No enlarged mediastinal, hilar, or axillary lymph nodes. Thyroid gland, trachea, and esophagus demonstrate no significant findings. Lungs/Pleura: Minimal linear atelectasis or scarring at the left lung base. 5 x 3 mm left lower lobe nodule with a mean diameter of 4 mm on image number 100/5. Taking differences in slice thickness into account, this demonstrates little if any change since 03/07/2020. There is also a small calcified granuloma more superiorly in the left lower lobe on image number 77/5. On image number 97/5 and coronal image number 58/6, there is a 4 x 2 mm oval nodular density with a mean diameter of 3 mm. It is difficult determine if this is a lung nodule or prominent vessel branch point. No other lung nodules seen. No pleural fluid. Musculoskeletal: Thoracic and lower cervical spine degenerative changes. No evidence of bony metastatic disease. CT ABDOMEN PELVIS FINDINGS Hepatobiliary: Multiple small noncalcified gallstones in the dependent portion of the gallbladder measuring up 5 mm in maximum diameter each. No gallbladder wall thickening or pericholecystic fluid. Unremarkable liver. No liver masses are seen. Pancreas: Unremarkable. No pancreatic ductal dilatation or surrounding inflammatory changes. Spleen: Normal in size without focal abnormality. Adrenals/Urinary Tract: Normal appearing adrenal glands. Small bilateral renal cysts. These do not need imaging follow-up. Poorly distended urinary bladder. No ureteral abnormalities seen. Stomach/Bowel: Interval concentric mass involving the right colon and ileocecal valve measuring 7.3  cm in length on coronal image number 88/6 with a maximum wall thickness of 1.7 cm on the same image. This mass is confluent with an immediately adjacent mass medially with central low density, measuring 3.8 x 3.0 cm on image number 92/3 and 4.1 cm in length on coronal image number 83/6. There are multiple adjacent lymph node medial to these masses. The largest has a short axis diameter of 2.4 cm on image number 86/3. Multiple descending and sigmoid colon diverticula. Mild wall thickening involving the terminal ileum at the ileocecal valve. The remainder of the small bowel is unremarkable. Normal-appearing appendix and stomach. Vascular/Lymphatic: Mild atheromatous arterial calcifications without aneurysm. Multiple enlarged mesenteric lymph nodes medial to the right colon mass, as described above. No other enlarged lymph nodes seen. Reproductive: Prostate is unremarkable. Other: No abdominal wall hernia or abnormality. No abdominopelvic ascites. Musculoskeletal: Lumbar spine degenerative changes. Mild bilateral hip degenerative changes. No evidence of bony metastatic disease. IMPRESSION: 1. Interval concentric mass involving the right colon, ileocecal valve and distal terminal ileum, compatible with the patient's known colon carcinoma. No associated obstruction. 2. Adjacent mass with central low density, compatible with local extension of necrotic tumor or an enlarged, necrotic metastatic lymph node. 3. Multiple adjacent enlarged mesenteric lymph nodes medial to these masses, compatible with metastatic adenopathy. 4. Colonic diverticulosis. 5. Cholelithiasis. 6. 4 mm mean diameter left lower lobe nodule possible 3 mm mean diameter right middle lobe nodule. These are felt to have a low likelihood of representing metastatic disease and could be followed on subsequent examinations. Electronically Signed   By: SClaudie ReveringM.D.   On: 09/18/2022 17:04   CT Abdomen Pelvis W Contrast  Result Date: 09/18/2022 CLINICAL  DATA:  Colon cancer diagnosed on 09/08/2022. Elevated CEA. Staging evaluation. EXAM: CT CHEST, ABDOMEN, AND PELVIS WITH CONTRAST TECHNIQUE:  Multidetector CT imaging of the chest, abdomen and pelvis was performed following the standard protocol during bolus administration of intravenous contrast. RADIATION DOSE REDUCTION: This exam was performed according to the departmental dose-optimization program which includes automated exposure control, adjustment of the mA and/or kV according to patient size and/or use of iterative reconstruction technique. CONTRAST:  166m OMNIPAQUE IOHEXOL 300 MG/ML  SOLN COMPARISON:  Abdomen and pelvis CT dated 03/07/2020 FINDINGS: CT CHEST FINDINGS Cardiovascular: Minimal coronary artery calcification. Normal sized heart. Mediastinum/Nodes: No enlarged mediastinal, hilar, or axillary lymph nodes. Thyroid gland, trachea, and esophagus demonstrate no significant findings. Lungs/Pleura: Minimal linear atelectasis or scarring at the left lung base. 5 x 3 mm left lower lobe nodule with a mean diameter of 4 mm on image number 100/5. Taking differences in slice thickness into account, this demonstrates little if any change since 03/07/2020. There is also a small calcified granuloma more superiorly in the left lower lobe on image number 77/5. On image number 97/5 and coronal image number 58/6, there is a 4 x 2 mm oval nodular density with a mean diameter of 3 mm. It is difficult determine if this is a lung nodule or prominent vessel branch point. No other lung nodules seen. No pleural fluid. Musculoskeletal: Thoracic and lower cervical spine degenerative changes. No evidence of bony metastatic disease. CT ABDOMEN PELVIS FINDINGS Hepatobiliary: Multiple small noncalcified gallstones in the dependent portion of the gallbladder measuring up 5 mm in maximum diameter each. No gallbladder wall thickening or pericholecystic fluid. Unremarkable liver. No liver masses are seen. Pancreas: Unremarkable. No  pancreatic ductal dilatation or surrounding inflammatory changes. Spleen: Normal in size without focal abnormality. Adrenals/Urinary Tract: Normal appearing adrenal glands. Small bilateral renal cysts. These do not need imaging follow-up. Poorly distended urinary bladder. No ureteral abnormalities seen. Stomach/Bowel: Interval concentric mass involving the right colon and ileocecal valve measuring 7.3 cm in length on coronal image number 88/6 with a maximum wall thickness of 1.7 cm on the same image. This mass is confluent with an immediately adjacent mass medially with central low density, measuring 3.8 x 3.0 cm on image number 92/3 and 4.1 cm in length on coronal image number 83/6. There are multiple adjacent lymph node medial to these masses. The largest has a short axis diameter of 2.4 cm on image number 86/3. Multiple descending and sigmoid colon diverticula. Mild wall thickening involving the terminal ileum at the ileocecal valve. The remainder of the small bowel is unremarkable. Normal-appearing appendix and stomach. Vascular/Lymphatic: Mild atheromatous arterial calcifications without aneurysm. Multiple enlarged mesenteric lymph nodes medial to the right colon mass, as described above. No other enlarged lymph nodes seen. Reproductive: Prostate is unremarkable. Other: No abdominal wall hernia or abnormality. No abdominopelvic ascites. Musculoskeletal: Lumbar spine degenerative changes. Mild bilateral hip degenerative changes. No evidence of bony metastatic disease. IMPRESSION: 1. Interval concentric mass involving the right colon, ileocecal valve and distal terminal ileum, compatible with the patient's known colon carcinoma. No associated obstruction. 2. Adjacent mass with central low density, compatible with local extension of necrotic tumor or an enlarged, necrotic metastatic lymph node. 3. Multiple adjacent enlarged mesenteric lymph nodes medial to these masses, compatible with metastatic adenopathy. 4.  Colonic diverticulosis. 5. Cholelithiasis. 6. 4 mm mean diameter left lower lobe nodule possible 3 mm mean diameter right middle lobe nodule. These are felt to have a low likelihood of representing metastatic disease and could be followed on subsequent examinations. Electronically Signed   By: SClaudie ReveringM.D.   On:  09/18/2022 17:04    ECHO 04/2022 EF >55%, G1 DD with normal LV and RV systolic function  TELEMETRY reviewed by me (LT) 09/30/2022 : A-fib rates early AM 50s-60s, now 80s-90s  EKG reviewed by me: 09/25/2022 sinus rhythm rate 60s  Data reviewed by me (LT) 09/30/2022: Surgery progress note, critical care consult and progress note, hospitalist progress note, CBC, BMP, BNP, troponins, EKGs, vitals, telemetry, I's and O's  Principal Problem:   S/P right colectomy Active Problems:   Diabetes ([place])   Essential hypertension   Paroxysmal A-fib ([facility])   S/P partial resection of colon   Hypotension   Hyponatremia   AKI (acute kidney injury) ([place])   Postoperative intra-abdominal abscess   Adenocarcinoma of colon ([place])   Persistent atrial fibrillation ([facility])   Sepsis ([place])   Diabetic ketoacidosis without coma associated with type 2 diabetes mellitus ([place])    ASSESSMENT AND PLAN:  Nicolas Zuniga is a 68yoM with a PMH of paroxysmal atrial fibrillation (on flecainide, metoprolol, and Eliquis), diabetes, hypertension, recently diagnosed adenocarcinoma of the colon who presented to [facility] 09/24/2022 for and elective laparoscopic right colectomy ultimately converted to open right colectomy and cholecystectomy.  Postoperative course was complicated by hypotension and bradycardia the evening of postop day 1, concerning for beta-blocker toxicity for which he was started on glucagon infusion.  Rapid response was called overnight on 10/21 for atrial fibrillation with RVR and questionable VT, was started on amiodarone and diltiazem infusions with some heart rate response.  #Adenocarcinoma of the colon s/p  open R colectomy & CCY 10/19 #S/p exploratory laparotomy, abdominal washout, and diverting loop ileostomy 10/23 #Intra-abdominal abscess with ?Septic shock -Off vasopressor support entirely the morning of 10/25, blood pressure stable if not slightly elevated -Procalcitonin trend 21.52-18.25-20.55, leukocytosis slight worsening but mostly stable at 14.7 today  #Paroxysmal atrial fibrillation with RVR, Was previously on flecainide, metoprolol, and Eliquis which have all been held after rapid response on 10/21.  His heart rates were difficult to control, and was in A-fib in the 120s at rest with paroxysms to the 150s throughout the day of 10/23 and converted to atrial flutter with controlled ventricular response in the evening.  Bradycardic overnight so diltiazem and Precedex infusions were discontinued on 10/24. -Continue to treat causes of increased adrenergic tone including pain, infection, hypoxia -Monitor and replete electrolytes to maintain a K >4, mag >2 -Continue amiodarone infusion until able to take p.o. -Favor restarting low-dose metoprolol tartrate once able to take p.o. -Hold diltiazem infusion, can restart if heart rate is uncontrolled. -Revisit ablation on an outpatient basis with Dr. Mylinda Latina -CHA2DS2-VASc 3 (age, htn, dm2).   -Continue heparin infusion while inpatient, restart Eliquis 5 mg twice daily at discharge if okay from a surgical standpoint.  #Chronic HFpEF (EF 65-70% 09/2022, previous >55% 04/2022) Remains net +9 L, required IVF for hypotension earlier in hospital course.  Not significantly clinically volume overloaded, although chest x-ray shows with L pleural effusion. -BNP slightly elevated at 400 -Consider diuresis later in clinical course, but will defer to nephrology  #Acute renal failure Renal function with steady downtrend was read today plateau.  He is making some urine today. -BUN/creatinine 46/4.61 and GFR of 13 today Query ATN with significant hypotension  requiring vasopressor support.  His renal function has been labile since 10/20. on day of admission his baseline was BUN/creatinine 22/1.23 and GFR >60.  He has received multiple CT scans with contrast this admission. -Nephrology consulted by primary team, appreciate their assistance  #  Postoperative anemia Hgb down trended from 9.1 to a low of 7.3 the evening of 10/23, stable at 7.6 the past 2 days  This patient's plan of care was discussed and created with Dr. Nehemiah Massed and he is in agreement.  Signed: Tristan Schroeder , PA-C 09/30/2022, 11:45 AM [facility]

## 2022-09-30 NOTE — Consult Note (Signed)
ANTICOAGULATION CONSULT NOTE  Pharmacy Consult for Heparin Infusion Indication: atrial fibrillation  Patient Measurements: Height: '6\' 2"'$  (188 cm) Weight: (!) 139.3 kg (307 lb 1.6 oz) IBW/kg (Calculated) : 82.2 Heparin Dosing Weight: 107.3 kg  Labs: Recent Labs     0000 09/27/22 2028 09/28/22 0458 09/28/22 0459 09/28/22 1449 09/28/22 1450 09/28/22 1807 09/28/22 1945 09/28/22 2232 09/29/22 0106 09/29/22 1041 09/29/22 1509 09/29/22 2042 09/30/22 0413  HGB   < >  --   --  9.1*  --  7.6* 7.3*  --   --  7.6*  --   --   --  7.6*  HCT   < >  --   --  30.1*  --  26.0* 25.1*  --   --  25.4*  --   --   --  24.2*  PLT   < >  --   --  303  --  259  --   --   --  254  --   --   --  199  LABPROT  --   --  23.4*  --   --   --   --   --   --   --   --   --   --   --   INR  --   --  2.1*  --   --   --   --   --   --   --   --   --   --   --   HEPARINUNFRC  --  <0.10*  --  0.13*  --   --   --   --   --   --   --   --   --  <0.10*  CREATININE  --   --   --  1.86*   < >  --  2.71*  --   --  2.97*   < > 4.12* 4.33* 4.61*  TROPONINIHS  --   --   --   --    < > 90*  --  80* 109* 70*  --   --   --   --    < > = values in this interval not displayed.     Estimated Creatinine Clearance: 23.4 mL/min (A) (by C-G formula based on SCr of 4.61 mg/dL (H)).   Medical History: Past Medical History:  Diagnosis Date   Adenocarcinoma of colon ([place]) 09/08/2022   Cholelithiasis    Chronic gastritis    Colon polyps    Coronary artery calcification seen on CT scan    Diastolic dysfunction    a.) TTE 04/09/2022: EF >55%, triv TR, G1DD   Diverticulosis    Hypertension    Long term current use of anticoagulant    a.) apixaban   Nephrolithiasis    Paroxysmal A-fib ([place])    a.) CHA2DS2VASc = 3 (age, HTN, T2DM);  b.) s/p DCCV 07/09/2016 (100 J x1), 12/22/2018 (120 J x1), 08/18/2021 (120 J x1), 03/11/2022 (120 J x1), 03/31/2022 (120 J x1); c.) rate/rhythm maintained on oral flecanide + metoprolol  succinate; chronically anticoagulated with apixaban   T2DM (type 2 diabetes mellitus) ([place])    Assessment: Nicolas Zuniga is a 66 y.o. male presenting for laparoscopic R colectomy and cholecystectomy. PMH significant for recently diagnosed adenocarcinoma of the colon, DM, HTN, Afib on metoprolol, flecainide, and apixaban. Patient was on [facility] PTA per chart review, last dose of apixaban PTA reported to be on 10/16. Last  enoxaparin dose 10/20 AM. Pharmacy has been consulted to initiate and manage heparin infusion.   Baseline Labs: aPTT 44, PT 22.1, INR 2.0, Hgb 9.3, Hct 31.1, Plt 208   Patient previously on heparin this admission. Noted patient required multiple titrations due to subtherapeutic levels, last rate with levels was 1950 units/hr with HL 0.13. Anticoagulation was subsequently discontinued for ExLap.  Goal of Therapy:  Heparin level 0.3 - 0.5 units/ml (no bolus protocol) Monitor platelets by anticoagulation protocol: Yes   Plan:  --10/25'@1408'$  HL<0.10, subtherapeutic --Will increase heparin rate to 1950 units/hr --Will check heparin level 8 hours after rate change --Daily CBC per protocol while on IV heparin; note worsening anemia & thrombocytopenia  Thank you for allowing pharmacy to be a part of this patient's care.  Dorothe Pea, PharmD, BCPS Clinical Pharmacist   09/30/2022 4:07 PM

## 2022-09-30 NOTE — Progress Notes (Signed)
PHARMACY - TOTAL PARENTERAL NUTRITION CONSULT NOTE   Indication: takeback yesterday for anastomosic leak, intra-abdominal infection, will likely have post-op ileus  Patient Measurements: Height: '6\' 2"'$  (188 cm) Weight: (!) 139.3 kg (307 lb 1.6 oz) IBW/kg (Calculated) : 82.2 TPN AdjBW (KG): 91.1 Body mass index is 39.43 kg/m.  Assessment: Pt with hx of recently diagnosed adenocarcinoma of the colon who is currently admitted status post right colectomy, diabetes, hypertension, and A-fib status post multiple ablations  Glucose / Insulin: insulin infusion >> glargine 20u/day + SSI q4h Electrolytes: hyponatremic, hypokalemic, hypophosphatemia Renal: SCR 1.93-->1.45-->4.61 Hepatic: AST, ALT wnl Intake/Output last 2 shifts:  10/24 0701 - 10/25 0700 In: 3614.9 [I.V.:3315.8; IV Piggyback:299.2] Out: 52 [Urine:135; Drains:150]  MIVF: none GI Imaging:No new pertinent imaging studies GI Surgeries / Procedures: s/p exploratory laparotomy, abdominal washout, and diverting loop ileostomy 10/23  Central access: 09/24/22 TPN start date: 09/29/22  Nutritional Goals: Goal TPN rate is 95 mL/hr (provides 179.9 g of protein and 1648 kcals per day)  RD Assessment: Estimated Needs Total Energy Estimated Needs: 1298-1652kcal/day Total Protein Estimated Needs: 170-180g/day Total Fluid Estimated Needs: 2.2-2.5L/day  Current Nutrition:  NPO  Plan:  --Increase TPN to 60 mL/hr at 1800 --Nutritional components: Amino acids (using 15% Clinisol): 113.6 grams Dextrose: 80.6 grams  Lipids (using 20% SMOFlipids): 31.2 grams kCal: 1040.7 /24h --Electrolytes in TPN: Na 76mq/L, K 567m/L, Ca 91m56mL, Mg 91mE57m, and Phos 20mm32m. Cl:Ac 1:1 --Continue rSSI q4h + levemir 20 units/day --10 mEq iv KCl x 2 per NP --Monitor TPN labs on Mon/Thurs, daily until stable  RodneDallie Piles5/2023,7:08 AM

## 2022-09-30 NOTE — Progress Notes (Signed)
Patient alert and oriented x4, Afibb, on [place]. 2+ pulses, with cool extremities, cyanosis on toe nailbeds. Notified PA of ostomy assessment. Endotool transitioned off. Oozing at JP site, dressing applied, drain emptied. Ileostomy making output, 50 empty and recorded. 300 out per foley. Dressings,clean/dry/intact.

## 2022-09-30 NOTE — Progress Notes (Signed)
NAME:  Nicolas Zuniga, MRN:  [id number], DOB:  1956/12/05, LOS: 6 ADMISSION DATE:  09/24/2022, CONSULTATION DATE: 09/28/22 REFERRING MD: Dr. Dahlia Byes   History of Present Illness:  This is a 66 yo male with right colon cancer and cholelithiasis who presented to [facility] on 10/19 for laparoscopic right colectomy and cholecystectomy.  Surgical findings were right colon mass with evidence of bulky mesenteric nodal involvement; large right colon mass adhered to the retroperitoneum; mesenteric nodes within terminal ileum; tension free anastomosis, no evidence of intraop leak and good perfusion; and chronic calculus cholecystitis.  Postop pt admitted to the medsurg unit per general surgery for additional workup and treatment.  See detailed hospital course under significant events.  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 Small to moderate amount of pneumoperitoneum and small amount of ascites within the abdomen and pelvis, not unexpected given surgery but  nonspecific. No evidence of focal collection/defined abscess. Trace bilateral pleural effusions and bibasilar atelectasis. 2 low-density areas within the prostate gland, nonspecific but correlate with PSA. Aortic Atherosclerosis (ICD10-I70.0). 10/22: CTA Chest no definite evidence of pulmonary embolus. Minimal bilateral pleural effusions are noted with adjacent subsegmental atelectasis. Pneumoperitoneum is noted in visualized portion of upper abdomen consistent with reported history of recent laparoscopic right colectomy  and cholecystectomy. 10/23: CT Abd Pelvis while there is no evidence for gross enteric contrast extravasation at the level of the ileocolic anastomosis, the small fluid collection adjacent to the anastomosis identified on yesterday's study has increased in attenuation in the interval. This increase in attenuation is concerning for some trace spill of contrast from the anastomosis into the collection. Slight increase in fluid volume in the right paracolic gutter with similar fluid volume around the liver and in the left paracolic gutter. Collections of interloop mesenteric fluid are not substantially changed. Free gas in the peritoneal cavity is not unexpected 4 days out from surgery. Gas in the midline subcutaneous tissues deep to the staple line has increased in the interval. Contrast in the kidneys compatible with residual from yesterday's infused scan. Bibasilar atelectasis with tiny bilateral pleural effusions. Aortic Atherosclerosis (ICD10-I70.0).  Significant Hospital Events: Including procedures, antibiotic start and stop dates in addition to other pertinent events   10/19: Pt admitted to the [facility] unit per general surgery post laparoscopic right colectomy and cholecystectomy 10/20: Pt became hypotensive and bradycardic suspected secondary to beta blocker/antiarrythmic medication pt received glucagon and iv fluid bolus.  Hospitalist team consulted to assist with management all beta blockers and flecainide discontinued  10/22: Pt developed atrial fibrillation with rvr, acute respiratory failure, and hypotension with questionable episode of Vtach, rapid response initiated.  Pt required amiodarone bolus followed by drip and transfer to the stepdown unit.  Rate remained uncontrolled and cardizem gtt added '@15'$  mg/hr in addition to amiodarone gtt.  Cardiology consulted   10/22: CT Abd Pelvis concerning for possible small leak but no contrast extravasation.  General Surgery recommended- antibiotic  therapy; repeat scan in 24-48hrs; and keep NPO except meds  10/23: Revealed no evidence of gross enteric contrast extravasation but increased fluid collection adjacent to the anastomosis and slight increase in fluid volume in the  right paracolic gutter.  Pt taken back to the OR for exploratory laparotomy with findings of no obvious anastomotic leak or evidence of large or small bowel injuries but intra-abdominal purulent fluid with significant inflammatory response present. Abdominal washout with placement of JP drain and diverting loop colostomy. Pt remained mechanically intubated.  PCCM        team consulted  10/24: Pt with worsening acute renal failure Nephrology consulted.  Pt with bradycardia overnight cardizem and precedex gtts discontinued.  Pt remains on amiodarone gtt at 30 mg/hr  Pt alert and following commands plans for possible SBT today ~ EXTUBATED 10/25: Tolerating extubation from respiratory standpoint. Off Vasopressors. Slight worsening of AKI, starting to make urine.  DKA resolved, weaning off insulin gtt to SQ insulin.  Atrial fibrillation rate controlled on Amiodarone.  Interim History / Subjective:  -Extubated yesterday, tolerating from respiratory standpoint, on 3L [place] -No significant events noted overnight -Afebrile, hemodynamically stable, no vasopressors -Creatinine slightly worsened to 4.6 from 4.3, acidosis resolved, starting to make urine, UOP 135 cc (net +9.9 L) ~ Nephrology following -Leukocytosis slightly worsened to 14.7 from 13.4, but no fever, off pressors, some abdominal pain ~ Surgery following -DKA resolved, in process of transitioning off insulin gtt to SQ insulin -Atrial fibrillation rate controlled, HR ranges from 90-100 on Amiodarone and Heparin gtt's  Objective   Blood pressure (!) 145/63, pulse 93, temperature 98.2 F (36.8 C), temperature source Oral, resp. rate (!) 27, height '6\' 2"'$  (1.88 m), weight (!) 139.3 kg, SpO2 100 %.    Vent Mode: PSV;CPAP FiO2  (%):  [30 %-40 %] 30 % Set Rate:  [20 bmp] 20 bmp Vt Set:  [600 mL] 600 mL PEEP:  [5 cmH20] 5 cmH20 Pressure Support:  [5 cmH20] 5 cmH20   Intake/Output Summary (Last 24 hours) at 09/30/2022 2297 Last data filed at 09/30/2022 0600 Gross per 24 hour  Intake 3614.94 ml  Output 225 ml  Net 3389.94 ml    Filed Weights   09/24/22 1038 09/27/22 0148 09/30/22 0500  Weight: 124.7 kg 118 kg (!) 139.3 kg   Examination: General: Acutely-ill appearing male, sitting in bed, on 3L [place], in NAD HENT: Supple, no JVD present Lungs: Clear throughout, no wheezing or rales noted, even, non labored  Cardiovascular: Irregular irregular, no r/g, 1+ radial/1+ distal pulses presented, 1+ generalized edema present  Abdomen: No audible BS present, non distended, midline abdominal incision with dressing dry/intact; JP drain present with sanguinous drainage; right quadrant diverting loop colostomy  Extremities: Normal tone and bulk  Neuro: Awake and alert, following commands, no focal deficits, speech clear, PERRL GU: Indwelling foley catheter in place draining tea colored urine   Resolved Hospital Problem list   Bradycardia   Assessment & Plan:  Acute respiratory failure secondary to severe metabolic acidosis  Mechanical intubation intraoperatively for airway protection ~ EXTUBATED 10/24 -Treatment of metabolic disorders as outlined below -Supplemental O2 as needed to maintain O2 sats >92% -Follow intermittent Chest X-ray & ABG as needed -Bronchodilators & Pulmicort nebs -Aggressive Pulmonary toilet as able  Atrial fibrillation with rvr~improving   Hypotension secondary to hypovolemia and septic shock ~ RESOLVED Mildly elevated Troponin due to demand ischemia Hx: HTN and Chronic diastolic dysfunction  ECHOCARDIOGRAM 09/29/22: LVEF 98-92%, normal diastolic function, RV function normal -Continuous cardiac monitoring -Maintain MAP >65 -IV fluids -Vasopressors as needed to maintain MAP goal ~ weaned  off -Trend lactic acid until normalized (5.7 ~ 4.4. ~ 2.7 ~) -HS Troponin peaked at 109 -Cardiology  following, appreciate input -Continue Amiodarone and Heparin infusions -BP improved, consider re-implementing outpatient BB, will defer to Cardiology  Acute kidney injury secondary ATN~worsening  Hyponatremia ~ IMPROVING Anion gap metabolic acidosis ~ RESOLVED Mild Hyponatremia -Monitor I&O's / urinary output -Follow BMP -Ensure adequate renal perfusion -Avoid nephrotoxic agents as able -Replace electrolytes as indicated -Pharmacy following for assistance with electrolyte replacement  -Nephrology following, appreciate input  Right colon cancer and cholelithiasis s/p laparoscopic right colectomy and cholecystectomy: 09/24/22 Intra-abdominal infection s/p exploratory laparotomy with abdominal washout and diverting loop ileostomy 10/23 -Monitor fever curve -Trend WBC's & Procalcitonin -Follow cultures as above -Continue empiric Zosyn pending cultures & sensitivities -General Surgery primary service, appreciate input  -NGT LIS   Type II diabetes mellitus  DKA -CBG's q4h; Target range of 140 to 180 -SSI (DKA resolved, in process of transitioning to SQ insulin) -Follow ICU Hypo/Hyperglycemia protocol -Diabetes coordinator following, appreciate input    Best Practice (right click and "Reselect all SmartList Selections" daily)  Diet/type: NPO, TPN DVT prophylaxis: Heparin gtt GI prophylaxis: PPI Lines: Right upper extremity PICC and still needed  Foley:  Yes, and it is still needed Code Status:  full code Last date of multidisciplinary goals of care discussion [09/30/22]  10/25: Pt and his wife updated at bedside.  Labs   CBC: Recent Labs  Lab 09/27/22 0332 09/28/22 0459 09/28/22 1450 09/28/22 1807 09/29/22 0106 09/30/22 0413  WBC 8.5 10.8* 14.2*  --  13.4* 14.7*  NEUTROABS  --   --  9.3*  --  9.0* 10.6*  HGB 9.3* 9.1* 7.6* 7.3* 7.6* 7.6*  HCT 31.1* 30.1* 26.0*  25.1* 25.4* 24.2*  MCV 79.3* 79.2* 80.2  --  78.9* 74.5*  PLT 208 303 259  --  254 199     Basic Metabolic Panel: Recent Labs  Lab 09/25/22 0613 09/25/22 2138 09/27/22 0332 09/28/22 0459 09/28/22 1449 09/28/22 1450 09/28/22 1807 09/29/22 0106 09/29/22 1041 09/29/22 1140 09/29/22 1509 09/29/22 2042 09/30/22 0413  NA 131*   < > 133* 128*   < >  --    < > 130* 130* 126* 130* 128* 131*  K 4.3   < > 4.3 4.0   < >  --    < > 4.1 3.4* 3.3* 3.8 3.6 3.4*  CL 100   < > 103 96*   < >  --    < > 94* 97* 93* 98 97* 99  CO2 17*   < > 18* 16*   < >  --    < > 16* 17* 17* 19* 21* 22  GLUCOSE 232*   < > 84 204*   < >  --    < > 324* 256* 190* 153* 191* 150*  BUN 22   < > 22 25*   < >  --    < > 29* 35* 36* 40* 44* 46*  CREATININE 1.23   < > 1.45* 1.86*   < >  --    < > 2.97* 3.76* 3.59* 4.12* 4.33* 4.61*  CALCIUM 8.4*   < > 8.9 8.4*   < >  --    < > 7.5* 8.1* 7.8* 8.7* 8.3* 8.4*  MG 1.7  --  1.5* 1.9  --   --   --  1.8  --   --   --   --  2.6*  PHOS  --   --   --   --   --  4.4  --  3.1  --   --   --   --  1.6*   < > = values in this interval not displayed.    GFR: Estimated Creatinine Clearance: 23.4 mL/min (A) (by C-G formula based on SCr of 4.61 mg/dL (H)). Recent Labs  Lab 09/28/22 0459 09/28/22 1449 09/28/22 1450 09/28/22 1702 09/28/22 1945 09/29/22 0106 09/30/22 0413  PROCALCITON  --  21.52  --   --   --  18.25 20.55  WBC 10.8*  --  14.2*  --   --  13.4* 14.7*  LATICACIDVEN  --   --   --  5.7* 4.4* 2.7*  --      Liver Function Tests: Recent Labs  Lab 09/26/22 0412 09/26/22 2311 09/28/22 1449  AST 29 32 22  ALT '16 14 10  '$ ALKPHOS 43 40 50  BILITOT 0.5 0.9 0.8  PROT 5.4* 6.0* 4.4*  ALBUMIN 2.7* 2.9* 2.2*    No results for input(s): "LIPASE", "AMYLASE" in the last 168 hours. No results for input(s): "AMMONIA" in the last 168 hours.  ABG    Component Value Date/Time   PHART 7.39 09/29/2022 0653   PCO2ART 25 (L) 09/29/2022 0653   PO2ART 89 09/29/2022 0653    HCO3 23.7 09/30/2022 0833   ACIDBASEDEF 1.3 09/30/2022 0833   O2SAT 85.1 09/30/2022 0833     Coagulation Profile: Recent Labs  Lab 09/27/22 1222 09/28/22 0458  INR 2.0* 2.1*     Cardiac Enzymes: No results for input(s): "CKTOTAL", "CKMB", "CKMBINDEX", "TROPONINI" in the last 168 hours.  HbA1C: Hemoglobin A1C  Date/Time Value Ref Range Status  06/16/2012 10:17 AM 7.2 (H) 4.2 - 6.3 % Final    Comment:    The American Diabetes Association recommends that a primary goal of therapy should be <7% and that physicians should reevaluate the treatment regimen in patients with HbA1c values consistently >8%.    Hgb A1c MFr Bld  Date/Time Value Ref Range Status  09/24/2022 05:44 PM 9.8 (H) 4.8 - 5.6 % Final    Comment:    (NOTE) Pre diabetes:          5.7%-6.4%  Diabetes:              >6.4%  Glycemic control for   <7.0% adults with diabetes   08/08/2021 01:43 PM 7.4 (H) 4.8 - 5.6 % Final    Comment:    (NOTE) Pre diabetes:          5.7%-6.4%  Diabetes:              >6.4%  Glycemic control for   <7.0% adults with diabetes     CBG: Recent Labs  Lab 09/30/22 0618 09/30/22 0721 09/30/22 0758 09/30/22 0821 09/30/22 0900  GLUCAP 146* 145* 142* 149* 151*     Review of Systems:   Positives in BOLD: Gen: Denies fever, chills, weight change, fatigue, night sweats HEENT: Denies blurred vision, double vision, hearing loss, tinnitus, sinus congestion, rhinorrhea, sore throat, neck stiffness, dysphagia PULM: Denies shortness of breath, cough, sputum production, hemoptysis, wheezing CV: Denies chest pain, edema, orthopnea, paroxysmal nocturnal dyspnea, palpitations GI: Denies abdominal pain, nausea, vomiting, diarrhea, hematochezia, melena, constipation, change in bowel habits GU: Denies dysuria, hematuria, polyuria, oliguria, urethral discharge Endocrine: Denies hot or cold intolerance, polyuria, polyphagia or appetite change Derm: Denies rash, dry skin, scaling or  peeling skin change Heme: Denies easy bruising, bleeding, bleeding gums Neuro: Denies headache, numbness, weakness, slurred speech, loss of memory or consciousness   Past Medical History:  He,  has a past medical history of Adenocarcinoma  of colon ([place]) (09/08/2022), Cholelithiasis, Chronic gastritis, Colon polyps, Coronary artery calcification seen on CT scan, Diastolic dysfunction, Diverticulosis, Hypertension, Long term current use of anticoagulant, Nephrolithiasis, Paroxysmal A-fib ([place]), and T2DM (type 2 diabetes mellitus) ([place]).   Surgical History:   Past Surgical History:  Procedure Laterality Date   CARDIOVERSION N/A 12/22/2018   Procedure: CARDIOVERSION (CATH LAB);  Surgeon: Corey Skains, MD;  Location: [facility] ORS;  Service: Cardiovascular;  Laterality: N/A;   CARDIOVERSION N/A 08/18/2021   Procedure: CARDIOVERSION;  Surgeon: Corey Skains, MD;  Location: [facility] ORS;  Service: Cardiovascular;  Laterality: N/A;   CARDIOVERSION N/A 03/11/2022   Procedure: CARDIOVERSION;  Surgeon: Corey Skains, MD;  Location: [facility] ORS;  Service: Cardiovascular;  Laterality: N/A;   CARDIOVERSION N/A 03/31/2022   Procedure: CARDIOVERSION;  Surgeon: Corey Skains, MD;  Location: [facility] ORS;  Service: Cardiovascular;  Laterality: N/A;   CHOLECYSTECTOMY N/A 09/24/2022   Procedure: LAPAROSCOPIC CHOLECYSTECTOMY;  Surgeon: Jules Husbands, MD;  Location: [facility] ORS;  Service: General;  Laterality: N/A;   COLONOSCOPY WITH PROPOFOL N/A 09/08/2022   Procedure: COLONOSCOPY WITH PROPOFOL;  Surgeon: Lucilla Lame, MD;  Location: [facility] ENDOSCOPY;  Service: Endoscopy;  Laterality: N/A;   ELECTROPHYSIOLOGIC STUDY N/A 07/09/2016   Procedure: CARDIOVERSION;  Surgeon: Dionisio David, MD;  Location: [facility] ORS;  Service: Cardiovascular;  Laterality: N/A;   LAPAROSCOPIC RIGHT COLECTOMY N/A 09/24/2022   Procedure: LAPAROSCOPIC RIGHT COLECTOMY, RNFA to assist;  Surgeon: Jules Husbands, MD;  Location: [facility] ORS;   Service: General;  Laterality: N/A;   LAPAROTOMY N/A 09/28/2022   Procedure: EXPLORATORY LAPAROTOMY WITH OSTOMY CREATION;  Surgeon: Jules Husbands, MD;  Location: [facility] ORS;  Service: General;  Laterality: N/A;   TEE WITHOUT CARDIOVERSION N/A 07/09/2016   Procedure: TRANSESOPHAGEAL ECHOCARDIOGRAM (TEE);  Surgeon: Dionisio David, MD;  Location: [facility] ORS;  Service: Cardiovascular;  Laterality: N/A;   web fingers repaired     as a child     Social History:   reports that he has never smoked. He has never been exposed to tobacco smoke. He has never used smokeless tobacco. He reports that he does not drink alcohol and does not use drugs.   Family History:  His family history includes COPD in his father; Diabetes in his mother; Heart disease in his mother; Varicose Veins in his mother.   Allergies No Known Allergies   Home Medications  Prior to Admission medications   Medication Sig Start Date End Date Taking? Authorizing Provider  bisacodyl (DULCOLAX) 5 MG EC tablet Take all 4 tablets at 8 am the morning prior to your surgery. 09/14/22  Yes Pabon, Diego F, MD  Cholecalciferol (VITAMIN D) 50 MCG (2000 UT) CAPS Take 2,000 Units by mouth daily.   Yes [provider]  flecainide (TAMBOCOR) 100 MG tablet Take 100 mg by mouth 2 (two) times daily. 03/17/22  Yes [provider]  glipiZIDE (GLUCOTROL XL) 10 MG 24 hr tablet Take 10 mg by mouth 2 (two) times daily.    Yes [provider]  metFORMIN (GLUCOPHAGE-XR) 500 MG 24 hr tablet Take 500 mg by mouth daily with breakfast. 02/10/21  Yes [provider]  metoprolol (LOPRESSOR) 100 MG tablet Take 100 mg by mouth 2 (two) times daily.   Yes [provider]  metroNIDAZOLE (FLAGYL) 500 MG tablet Take 2 tablets at 8AM, take 2 tablets at [facility], and take 2 tablets at 8PM the day prior to your surgery 09/14/22  Yes [place], [place],  MD  Multiple Vitamins-Minerals (MULTIVITAMIN WITH MINERALS) tablet Take 1 tablet by mouth daily  with breakfast.    Yes [provider]  neomycin (MYCIFRADIN) 500 MG tablet Take 2 tablet at 8am, take 2 tablets at 2pm, and take 2 tablets at 8pm the day prior to your surgery 09/14/22  Yes Pabon, Diego F, MD  Omega-3 Fatty Acids (FISH OIL) 1000 MG CAPS Take 1,000 mg by mouth daily.   Yes [provider]  OZEMPIC, 1 MG/DOSE, 4 MG/3ML SOPN Inject 1 mg into the skin every Wednesday. Patient stopped taking these medications 08/02/21  Yes [provider]  polyethylene glycol powder (MIRALAX) 17 GM/SCOOP powder Mix full container in 64 ounces of Gatorade or other clear liquid. NO Red 09/14/22  Yes Pabon, [place], MD  Turmeric 400 MG CAPS Take 400 mg by mouth daily.   Yes [provider]  vitamin B-12 (CYANOCOBALAMIN) 100 MCG tablet Take 500 mcg by mouth daily.   Yes [provider]  Vitamin E 400 units TABS Take 400 Units by mouth daily.   Yes [provider]  apixaban (ELIQUIS) 5 MG TABS tablet Take 5 mg by mouth 2 (two) times daily.    [provider]  Coenzyme Q10 (COQ10) 100 MG CAPS Take 100 mg by mouth daily.    [provider]     Care time: 38 minutes     Darel Hong, AGACNP-BC [facility] epic messenger for cross cover needs If after hours, please call E-link

## 2022-09-30 NOTE — Progress Notes (Signed)
   Inpatient Rehab Admissions Coordinator :  Per therapy recommendations patient was screened for CIR candidacy by Salisa Broz RN MSN. Patient is not yet at a level to tolerate the intensity required to pursue a CIR admit . Patient may have the potential to progress to become a candidate. The CIR admissions team will follow and monitor for progress and place a Rehab Consult order if felt to be appropriate. Please contact me with any questions.  Denni France RN MSN Admissions Coordinator [phone]  

## 2022-09-30 NOTE — TOC Initial Note (Signed)
Transition of Care [facility]) - Initial/Assessment Note    Patient Details  Name: Nicolas Zuniga MRN: [id number] Date of Birth: 1956/06/28  Transition of Care [facility]) CM/SW Contact:    Shelbie Hutching, RN Phone Number: 09/30/2022, 5:04 PM  Clinical Narrative:                 Patient admitted to the hospital s/p R colectomy.  Patient is currently in the ICU, extubated yesterday currently on  3L.  Wife is at the bedside.  RNCM met with patient and wife in the room, introduced self and explained role in DC planning. Patient is from home with wife and before surgery was independent.  They are agreeable for short term rehab if recommended, wife would be unable to care for patient if he was unable to get up from the bed without assistance as she can't lift him.  TOC will cont to follow.  Patient is on TPN, NG tube to suction.  Patient has not been out of bed yet, may need to get PT and OT evals.    Expected Discharge Plan: Skilled Nursing Facility Barriers to Discharge: Continued Medical Work up   Patient Goals and CMS Choice Patient states their goals for this hospitalization and ongoing recovery are:: to get better to get stronger CMS Medicare.gov Compare Post Acute Care list provided to:: Patient Choice offered to / list presented to : Patient, Spouse  Expected Discharge Plan and Services Expected Discharge Plan: [place]   Discharge Planning Services: CM Consult Post Acute Care Choice: [place] Living arrangements for the past 2 months: Single Family Home                                      Prior Living Arrangements/Services Living arrangements for the past 2 months: Single Family Home Lives with:: Spouse Patient language and need for interpreter reviewed:: Yes Do you feel safe going back to the place where you live?: Yes      Need for Family Participation in Patient Care: Yes (Comment) Care giver support system in place?: Yes (comment)    Criminal Activity/Legal Involvement Pertinent to Current Situation/Hospitalization: No - Comment as needed  Activities of Daily Living Home Assistive Devices/Equipment: None ADL Screening (condition at time of admission) Patient's cognitive ability adequate to safely complete daily activities?: Yes Is the patient deaf or have difficulty hearing?: No Does the patient have difficulty seeing, even when wearing glasses/contacts?: No Does the patient have difficulty concentrating, remembering, or making decisions?: No Patient able to express need for assistance with ADLs?: Yes Does the patient have difficulty dressing or bathing?: No Independently performs ADLs?: Yes (appropriate for developmental age) Does the patient have difficulty walking or climbing stairs?: No Weakness of Legs: None Weakness of Arms/Hands: None  Permission Sought/Granted Permission sought to share information with : Case Manager, Family Supports Permission granted to share information with : Yes, Verbal Permission Granted  Share Information with NAME: Shean Gerding     Permission granted to share info w Relationship: spouse  Permission granted to share info w Contact Information: [phone]  Emotional Assessment Appearance:: Appears stated age Attitude/Demeanor/Rapport: Lethargic Affect (typically observed): Accepting Orientation: : Oriented to Self, Oriented to Place, Oriented to  Time, Oriented to Situation Alcohol / Substance Use: Not Applicable Psych Involvement: No (comment)  Admission diagnosis:  S/P right colectomy [Z90.49] S/P partial resection of colon [Z90.49] Patient  Active Problem List   Diagnosis Date Noted   Diabetic ketoacidosis without coma associated with type 2 diabetes mellitus ([place])    Postoperative intra-abdominal abscess 09/28/2022   Adenocarcinoma of colon ([place])    Persistent atrial fibrillation ([place])    Sepsis ([place])    Hypotension 09/25/2022   Hyponatremia 09/25/2022   AKI  (acute kidney injury) ([place]) 09/25/2022   S/P right colectomy 09/24/2022   S/P partial resection of colon 09/24/2022   Change in bowel habits    Colonic mass    Polyp of transverse colon    Diabetes mellitus type 2, uncomplicated ([place]) 93/57/0177   Kidney stones 04/30/2020   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   Bradycardia 12/30/2018   Paroxysmal A-fib ([facility]) 12/14/2018   Non compliance with medical treatment 04/13/2018   History of atrial fibrillation 07/04/2012   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

## 2022-09-30 NOTE — Inpatient Diabetes Management (Signed)
Inpatient Diabetes Program Recommendations  AACE/ADA: New Consensus Statement on Inpatient Glycemic Control   Target Ranges:  Prepandial:   less than 140 mg/dL      Peak postprandial:   less than 180 mg/dL (1-2 hours)      Critically ill patients:  140 - 180 mg/dL    Latest Reference Range & Units 09/30/22 03:01 09/30/22 03:54 09/30/22 05:12 09/30/22 06:18 09/30/22 07:21 09/30/22 07:58 09/30/22 08:21 09/30/22 09:00  Glucose-Capillary 70 - 99 mg/dL 159 (H) 152 (H) 139 (H) 146 (H) 145 (H) 142 (H) 149 (H) 151 (H)   Review of Glycemic Control  Diabetes history: DM2 Outpatient Diabetes medications: Glipizide XL 10 mg BID, Metformin 500 mg QAM, Ozempic 1 mg Qweek Current orders for Inpatient glycemic control: Levemir 20 units Q24H, Novolog 0-9 units Q4H; TPN @ 30 ml/hr  Inpatient Diabetes Program Recommendations:    Insulin: Please consider increasing Novolog correction to 0-20 units Q4H. Would recommend adding regular insulin 25 units to TPN.  NOTE: Patient was ordered IV insulin which was transitioned to SQ insulin this morning. Drip rate over prior 8 hours ranged from 4.4 -6 units/hour. Patient received Levemir 20 units at 8:37 am today. Anticipate patient will need more insulin. Would be safest to add extra insulin to TPN in case it is stopped or held (which could lead to hypoglycemia if patient has extra SQ insulin on board).   Thanks, Barnie Alderman, RN, MSN, [employer] Diabetes Coordinator Inpatient Diabetes Program [phone] (Team Pager from 8am to [place])

## 2022-09-30 NOTE — Evaluation (Signed)
Physical Therapy Evaluation Patient Details Name: Nicolas Zuniga MRN: [id number] DOB: 1956-01-20 Today's Date: 09/30/2022  History of Present Illness  66 y.o. male  2 Days Post-Op s/p exploratory laparotomy, abdominal washout, and diverting loop ileostomy for intra-abdominal purulent fluid after initial open right colectomy with open cholecystectomy for right colon cancer and cholelithiasis on 10/19  Clinical Impression  Pt is a pleasant 66 year old male who was admitted for R colectomy on 10/19 and developed hypotension and Afib with RVR along with abscess with additional surgery on 10/23 for exploratory laparotomy. Pt was intubated 10/23-10/24. Supine there-ex performed this date, however mobility deferred as pt just returned back to bed with OT. Pt demonstrates deficits with strength/mobility/balance/pain. Would benefit from skilled PT to address above deficits and promote optimal return to PLOF. Currently recommend CIR as pt was very indep PTA. Pt very medically complex at this time. Will perform mobility assessment next date as pt only agreeable to supine there-ex secondary to fatigue.     Recommendations for follow up therapy are one component of a multi-disciplinary discharge planning process, led by the attending physician.  Recommendations may be updated based on patient status, additional functional criteria and insurance authorization.  Follow Up Recommendations Acute inpatient rehab (3hours/day) (pending mobility assessment)      Assistance Recommended at Discharge Frequent or constant Supervision/Assistance  Patient can return home with the following  Two people to help with walking and/or transfers;Two people to help with bathing/dressing/bathroom    Equipment Recommendations  (TBD)  Recommendations for Other Services       Functional Status Assessment Patient has had a recent decline in their functional status and demonstrates the ability to make significant improvements in  function in a reasonable and predictable amount of time.     Precautions / Restrictions Precautions Precautions: Fall Restrictions Weight Bearing Restrictions: No      Mobility  Bed Mobility               General bed mobility comments: pt just finished performing bed mobility with OT, deferred for this session.    Transfers                        Ambulation/Gait                  Stairs            Wheelchair Mobility    Modified Rankin (Stroke Patients Only)       Balance                                             Pertinent Vitals/Pain Pain Assessment Pain Assessment: 0-10 Pain Score: 5  Pain Location: abdomen Pain Descriptors / Indicators: Discomfort, Operative site guarding Pain Intervention(s): Limited activity within patient's tolerance    Home Living Family/patient expects to be discharged to:: Private residence Living Arrangements: Spouse/significant other Available Help at Discharge: Family (wife works from home) Type of Home: House Home Access: Stairs to enter Entrance Stairs-Rails: Right Entrance Stairs-Number of Steps: 3   Home Layout: One level Home Equipment: None      Prior Function Prior Level of Function : Independent/Modified Independent;Working/employed             Mobility Comments: indep, driving ADLs Comments: indep     Hand Dominance  Extremity/Trunk Assessment   Upper Extremity Assessment Upper Extremity Assessment: Generalized weakness    Lower Extremity Assessment Lower Extremity Assessment: Generalized weakness (B LE grossly 2+/5)       Communication   Communication: No difficulties  Cognition Arousal/Alertness: Awake/alert Behavior During Therapy: WFL for tasks assessed/performed Overall Cognitive Status: Within Functional Limits for tasks assessed                                 General Comments: became drowsy throughout session with  cues to keep eyes open        General Comments      Exercises Other Exercises Other Exercises: Supine ther-ex performed on B LE including quad sets, SLRs, and hip abd/add. 10 reps with min assist. Educated on frequency and duration   Assessment/Plan    PT Assessment Patient needs continued PT services  PT Problem List Decreased strength;Decreased mobility;Decreased activity tolerance;Pain       PT Treatment Interventions Gait training;DME instruction;Therapeutic exercise    PT Goals (Current goals can be found in the Care Plan section)  Acute Rehab PT Goals Patient Stated Goal: to go to rehab PT Goal Formulation: With patient Time For Goal Achievement: 10/14/22 Potential to Achieve Goals: Good    Frequency Min 2X/week     Co-evaluation               AM-PAC PT "6 Clicks" Mobility  Outcome Measure Help needed turning from your back to your side while in a flat bed without using bedrails?: A Lot Help needed moving from lying on your back to sitting on the side of a flat bed without using bedrails?: A Lot Help needed moving to and from a bed to a chair (including a wheelchair)?: Total Help needed standing up from a chair using your arms (e.g., wheelchair or bedside chair)?: Total Help needed to walk in hospital room?: Total Help needed climbing 3-5 steps with a railing? : Total 6 Click Score: 8    End of Session   Activity Tolerance: Patient tolerated treatment well Patient left: in bed Nurse Communication: Mobility status PT Visit Diagnosis: Muscle weakness (generalized) (M62.81);Difficulty in walking, not elsewhere classified (R26.2);Pain Pain - Right/Left:  (abdomen)    Time: 5170-0174 PT Time Calculation (min) (ACUTE ONLY): 9 min   Charges:   PT Evaluation $PT Eval Moderate Complexity: 1 [address] PT, DPT, GCS [phone]   Tasmia Blumer 09/30/2022, 4:57 PM

## 2022-09-30 NOTE — Consult Note (Signed)
ANTICOAGULATION CONSULT NOTE  Pharmacy Consult for Heparin Infusion Indication: atrial fibrillation  Patient Measurements: Height: '6\' 2"'$  (188 cm) Weight: 118 kg (260 lb 2.3 oz) IBW/kg (Calculated) : 82.2 Heparin Dosing Weight: 107.3 kg  Labs: Recent Labs     0000 09/27/22 1222 09/27/22 2028 09/28/22 0458 09/28/22 0459 09/28/22 1449 09/28/22 1450 09/28/22 1807 09/28/22 1945 09/28/22 2232 09/29/22 0106 09/29/22 1041 09/29/22 1509 09/29/22 2042 09/30/22 0413  HGB   < >  --   --   --  9.1*  --  7.6* 7.3*  --   --  7.6*  --   --   --  7.6*  HCT   < >  --   --   --  30.1*  --  26.0* 25.1*  --   --  25.4*  --   --   --  24.2*  PLT   < >  --   --   --  303  --  259  --   --   --  254  --   --   --  199  APTT  --  44*  --   --   --   --   --   --   --   --   --   --   --   --   --   LABPROT  --  22.1*  --  23.4*  --   --   --   --   --   --   --   --   --   --   --   INR  --  2.0*  --  2.1*  --   --   --   --   --   --   --   --   --   --   --   HEPARINUNFRC  --  <0.10* <0.10*  --  0.13*  --   --   --   --   --   --   --   --   --  <0.10*  CREATININE  --   --   --   --  1.86*   < >  --  2.71*  --   --  2.97*   < > 4.12* 4.33* 4.61*  TROPONINIHS  --   --   --   --   --    < > 90*  --  80* 109* 70*  --   --   --   --    < > = values in this interval not displayed.     Estimated Creatinine Clearance: 21.5 mL/min (A) (by C-G formula based on SCr of 4.61 mg/dL (H)).   Medical History: Past Medical History:  Diagnosis Date   Adenocarcinoma of colon ([place]) 09/08/2022   Cholelithiasis    Chronic gastritis    Colon polyps    Coronary artery calcification seen on CT scan    Diastolic dysfunction    a.) TTE 04/09/2022: EF >55%, triv TR, G1DD   Diverticulosis    Hypertension    Long term current use of anticoagulant    a.) apixaban   Nephrolithiasis    Paroxysmal A-fib ([place])    a.) CHA2DS2VASc = 3 (age, HTN, T2DM);  b.) s/p DCCV 07/09/2016 (100 J x1), 12/22/2018 (120 J x1),  08/18/2021 (120 J x1), 03/11/2022 (120 J x1), 03/31/2022 (120 J x1); c.) rate/rhythm maintained on oral flecanide + metoprolol succinate; chronically anticoagulated with  apixaban   T2DM (type 2 diabetes mellitus) ([place])    Assessment: Nicolas Zuniga is a 66 y.o. male presenting for laparoscopic R colectomy and cholecystectomy. PMH significant for recently diagnosed adenocarcinoma of the colon, DM, HTN, Afib on metoprolol, flecainide, and apixaban. Patient was on [facility] PTA per chart review, last dose of apixaban PTA reported to be on 10/16. Last enoxaparin dose 10/20 AM. Pharmacy has been consulted to initiate and manage heparin infusion.   Baseline Labs: aPTT 44, PT 22.1, INR 2.0, Hgb 9.3, Hct 31.1, Plt 208   Patient previously on heparin this admission. Noted patient required multiple titrations due to subtherapeutic levels, last rate with levels was 1950 units/hr with HL 0.13. Anticoagulation was subsequently discontinued for ExLap.  Goal of Therapy:  Heparin level 0.3 - 0.5 units/ml (no bolus protocol) Monitor platelets by anticoagulation protocol: Yes   Plan:  --10/25'@0413'$ : HL<0.10, subtherapeutic --Will increase heparin rate to 1700 units/hr, no bolus --Will check heparin level 8 hours after rate change --Daily CBC per protocol while on IV heparin; note worsening anemia & thrombocytopenia  Thank you for allowing pharmacy to be a part of this patient's care.  Candus Braud A Lillyrose Reitan  09/30/2022 5:07 AM

## 2022-09-30 NOTE — Progress Notes (Signed)
Nutrition Follow-up  DOCUMENTATION CODES:   Obesity unspecified  INTERVENTION:   TPN per pharmacy- plan is for 2/3 rate tonight   Recommend thiamine '100mg'$  daily in TPN x 3 days.   Daily weights   If tube feeds initiated, recommend:  Osmolite 1.5'@70ml'$ /hr- Initiate at 64m/hr and increase by 161mhr q 8 hours until goal rate is reached.   ProSource TF 20- Give 6010mID via tube, each supplement provides 80kcal and 20g of protein.   Free water flushes 5m13m hours to maintain tube patency   Regimen provides 2680kcal/day, 145g/day protein and 1460ml51m of free water.   Pt at high refeed risk; recommend monitor potassium, magnesium and phosphorus labs daily until stable  NUTRITION DIAGNOSIS:   Increased nutrient needs related to post-op healing as evidenced by estimated needs.  GOAL:   Patient will meet greater than or equal to 90% of their needs -progressing with tube feeds   MONITOR:   Diet advancement, Labs, Weight trends, Skin, I & O's, TPN  ASSESSMENT:   Nicolas Zuniga with h/o DM, HTN, Afib s/p cardioversion, anxiety, HLD, hiatal hernia, cholelithiasis and colon cancer s/p open right colectomy with ileocolic anastomosis and cholecystectomy 09/1905/23licated by anastamotic leak s/p exploratory laparotomy and abdominal washout and diverting loop ileostomy 10/23. Pt also with new DKA and AKI.  Pt extubated yesterday. NGT remains in place to LIS with minimal output. Pt is starting to have a small amount of ostomy function. Pt tolerated TPN at 1/3 rate; plan is to advance to 2/3 rate tonight. Pt is refeeding and electrolytes are being replaced. Will monitor blood glucoses and triglycerides which are both elevated. May need to add insulin to the TPN. Will plan to initiate trickle tube feeds once ok per surgery. If patient is able to be placed on a diet, will provide oral supplements. Per chart, pt is up ~30lbs from admission; pt +9.9L on his I & Os. Monitor daily weights.    Medications reviewed and include: insulin, heparin, levophed, zosyn, protonix  Labs reviewed: Na 131(L), K 3.4(L), BUN 46(H), creat 4.61(H), P 1.6(L), Mg 2.6(H) Triglycerides- 178(H) Wbc- 14.7(H), Hgb 7.6(L), Hct 24.2(L), MCV 74.5(L), MCH 23.4(L) Cbgs- 144, 151, 149, 142, 145 x 24 hrs  Diet Order:    Diet Order             Diet NPO time specified  Diet effective now                  EDUCATION NEEDS:   No education needs have been identified at this time  Skin:  Skin Assessment: Skin Integrity Issues: Skin Integrity Issues:: Incisions Incisions: closed abdomen  Last BM:  10/25- 20ml 36mostomy  Height:   Ht Readings from Last 1 Encounters:  09/28/22 '6\' 2"'$  (1.88 m)    Weight:   Wt Readings from Last 1 Encounters:  09/30/22 (!) 139.3 kg    Ideal Body Weight:  86.4 kg  BMI:  Body mass index is 39.43 kg/m.  Estimated Nutritional Needs:   Kcal:  2700-3000kcal/day  Protein:  135-150g/day  Fluid:  2.2-2.5L/day  Irisa Grimsley Koleen DistanceD, LDN Please refer to [facility] and/or RD on-call/weekend/after hours pager

## 2022-09-30 NOTE — Progress Notes (Signed)
[place], [place] 09/30/22  Subjective:   Hospital day # 6  Patient is critically ill.  Wife at bedside. He is now extubated.  Continues to have large amount of dependent edema.  TPN at 30 cc/h. Renal: 10/24 0701 - 10/25 0700 In: 3616.3 [I.V.:3316.9; IV Piggyback:299.4] Out: 285 [Urine:135; Drains:150] Lab Results  Component Value Date   CREATININE 4.61 (H) 09/30/2022   CREATININE 4.33 (H) 09/29/2022   CREATININE 4.12 (H) 09/29/2022     Objective:  Vital signs in last 24 hours:  Temp:  [97.7 F (36.5 C)-98.2 F (36.8 C)] 98.1 F (36.7 C) (10/25 1200) Pulse Rate:  [41-104] 102 (10/25 1200) Resp:  [16-29] 26 (10/25 1200) BP: (97-152)/(52-71) 134/61 (10/25 1200) SpO2:  [90 %-100 %] 99 % (10/25 1201) FiO2 (%):  [30 %] 30 % (10/24 2000) Weight:  [139.3 kg] 139.3 kg (10/25 0500)  Weight change:  Filed Weights   09/24/22 1038 09/27/22 0148 09/30/22 0500  Weight: 124.7 kg 118 kg (!) 139.3 kg    Intake/Output:    Intake/Output Summary (Last 24 hours) at 09/30/2022 1554 Last data filed at 09/30/2022 1100 Gross per 24 hour  Intake 3783.48 ml  Output 330 ml  Net 3453.48 ml     Physical Exam: General: Critically ill-appearing, laying in the bed  HEENT NG tube in place  Pulm/lungs Normal breathing effort, coarse breath sounds  CVS/Heart Irregular, A-fib  Abdomen:  ostomy in place  Extremities: Dependent peripheral edema present  Neurologic: Alert, able to answer simple questions  Skin: No acute rashes          Basic Metabolic Panel:  Recent Labs  Lab 09/25/22 0613 09/25/22 2138 09/27/22 0332 09/28/22 0459 09/28/22 1449 09/28/22 1450 09/28/22 1807 09/29/22 0106 09/29/22 1041 09/29/22 1140 09/29/22 1509 09/29/22 2042 09/30/22 0413  NA 131*   < > 133* 128*   < >  --    < > 130* 130* 126* 130* 128* 131*  K 4.3   < > 4.3 4.0   < >  --    < > 4.1 3.4* 3.3* 3.8 3.6 3.4*  CL 100   < > 103 96*   < >  --    < > 94* 97* 93* 98  97* 99  CO2 17*   < > 18* 16*   < >  --    < > 16* 17* 17* 19* 21* 22  GLUCOSE 232*   < > 84 204*   < >  --    < > 324* 256* 190* 153* 191* 150*  BUN 22   < > 22 25*   < >  --    < > 29* 35* 36* 40* 44* 46*  CREATININE 1.23   < > 1.45* 1.86*   < >  --    < > 2.97* 3.76* 3.59* 4.12* 4.33* 4.61*  CALCIUM 8.4*   < > 8.9 8.4*   < >  --    < > 7.5* 8.1* 7.8* 8.7* 8.3* 8.4*  MG 1.7  --  1.5* 1.9  --   --   --  1.8  --   --   --   --  2.6*  PHOS  --   --   --   --   --  4.4  --  3.1  --   --   --   --  1.6*   < > = values in this interval not displayed.  CBC: Recent Labs  Lab 09/27/22 0332 09/28/22 0459 09/28/22 1450 09/28/22 1807 09/29/22 0106 09/30/22 0413  WBC 8.5 10.8* 14.2*  --  13.4* 14.7*  NEUTROABS  --   --  9.3*  --  9.0* 10.6*  HGB 9.3* 9.1* 7.6* 7.3* 7.6* 7.6*  HCT 31.1* 30.1* 26.0* 25.1* 25.4* 24.2*  MCV 79.3* 79.2* 80.2  --  78.9* 74.5*  PLT 208 303 259  --  254 199     No results found for: "HEPBSAG", "HEPBSAB", "HEPBIGM"    Microbiology:  Recent Results (from the past 240 hour(s))  MRSA Next Gen by PCR, Nasal     Status: None   Collection Time: 09/27/22  1:53 AM   Specimen: Nasal Mucosa; Nasal Swab  Result Value Ref Range Status   MRSA by PCR Next Gen NOT DETECTED NOT DETECTED Final    Comment: (NOTE) The GeneXpert MRSA Assay (FDA approved for NASAL specimens only), is one component of a comprehensive MRSA colonization surveillance program. It is not intended to diagnose MRSA infection nor to guide or monitor treatment for MRSA infections. Test performance is not FDA approved in patients less than 18 years old. Performed at [facility], [address] [place], [place] [address]   Culture, Respiratory w Gram Stain     Status: None (Preliminary result)   Collection Time: 09/29/22  8:23 AM   Specimen: Tracheal Aspirate; Respiratory  Result Value Ref Range Status   Specimen Description   Final    TRACHEAL ASPIRATE Performed at [facility], [address] [place], [place] [address]    Special Requests   Final    NONE Performed at [facility], [address] [place], [place] [address]    Gram Stain   Final    RARE WBC PRESENT, PREDOMINANTLY PMN RARE BUDDING YEAST SEEN    Culture   Final    MODERATE SERRATIA MARCESCENS SUSCEPTIBILITIES TO FOLLOW CULTURE REINCUBATED FOR BETTER GROWTH Performed at [facility], [place] [address] [place], [place] [address]    Report Status PENDING  Incomplete    Coagulation Studies: Recent Labs    09/28/22 0458  LABPROT 23.4*  INR 2.1*    Urinalysis: No results for input(s): "COLORURINE", "LABSPEC", "PHURINE", "GLUCOSEU", "HGBUR", "BILIRUBINUR", "KETONESUR", "PROTEINUR", "UROBILINOGEN", "NITRITE", "LEUKOCYTESUR" in the last 72 hours.  Invalid input(s): "APPERANCEUR"    Imaging: ECHOCARDIOGRAM COMPLETE  Result Date: 09/29/2022    ECHOCARDIOGRAM REPORT   Patient Name:   Nicolas Zuniga Date of Exam: 09/29/2022 Medical Rec #:  [id number]     Height:       74.0 in Accession #:    [id number]    Weight:       260.1 lb Date of Birth:  [birth year]      BSA:          2.430 m Patient Age:    66 years      BP:           126/74 mmHg Patient Gender: M             HR:           83 bpm. Exam Location:  [facility] Procedure: 2D Echo, Color Doppler and Cardiac Doppler Indications:     Abnormal ECG R94.31  History:         Patient has prior history of Echocardiogram examinations, most                  recent  07/09/2016. Risk Factors:Hypertension and Diabetes.                  Paroxysmal Afib.  Sonographer:     Sherrie Sport Referring Phys:  [id number] Teressa Lower Diagnosing Phys: Serafina Royals MD  Sonographer Comments: Echo performed with patient supine and on artificial respirator, Technically challenging study due to limited acoustic windows and no subcostal window. IMPRESSIONS  1. Left ventricular ejection fraction, by estimation, is 65 to 70%. The left ventricle has normal function.  The left ventricle has no regional wall motion abnormalities. Left ventricular diastolic parameters were normal.  2. Right ventricular systolic function is normal. The right ventricular size is normal.  3. The mitral valve is normal in structure. Trivial mitral valve regurgitation.  4. The aortic valve is normal in structure. Aortic valve regurgitation is not visualized. FINDINGS  Left Ventricle: Left ventricular ejection fraction, by estimation, is 65 to 70%. The left ventricle has normal function. The left ventricle has no regional wall motion abnormalities. The left ventricular internal cavity size was small. There is no left ventricular hypertrophy. Left ventricular diastolic parameters were normal. Right Ventricle: The right ventricular size is normal. No increase in right ventricular wall thickness. Right ventricular systolic function is normal. Left Atrium: Left atrial size was normal in size. Right Atrium: Right atrial size was normal in size. Pericardium: There is no evidence of pericardial effusion. Mitral Valve: The mitral valve is normal in structure. Trivial mitral valve regurgitation. Tricuspid Valve: The tricuspid valve is normal in structure. Tricuspid valve regurgitation is trivial. Aortic Valve: The aortic valve is normal in structure. Aortic valve regurgitation is not visualized. Aortic valve mean gradient measures 2.0 mmHg. Aortic valve peak gradient measures 3.4 mmHg. Aortic valve area, by VTI measures 3.50 cm. Pulmonic Valve: The pulmonic valve was normal in structure. Pulmonic valve regurgitation is not visualized. Aorta: The aortic root and ascending aorta are structurally normal, with no evidence of dilitation. IAS/Shunts: No atrial level shunt detected by color flow Doppler.  LEFT VENTRICLE PLAX 2D LVIDd:         4.60 cm LVIDs:         3.30 cm LV PW:         1.20 cm LV IVS:        0.80 cm LVOT diam:     2.00 cm LV SV:         43 LV SV Index:   18 LVOT Area:     3.14 cm  RIGHT VENTRICLE RV  Basal diam:  4.10 cm RV Mid diam:    3.80 cm RV S prime:     11.40 cm/s TAPSE (M-mode): 2.0 cm LEFT ATRIUM           Index        RIGHT ATRIUM           Index LA diam:      3.10 cm 1.28 cm/m   RA Area:     14.80 cm LA Vol (A2C): 40.0 ml 16.46 ml/m  RA Volume:   37.20 ml  15.31 ml/m LA Vol (A4C): 70.1 ml 28.84 ml/m  AORTIC VALVE AV Area (Vmax):    2.22 cm AV Area (Vmean):   2.37 cm AV Area (VTI):     3.50 cm AV Vmax:           91.60 cm/s AV Vmean:          67.100 cm/s AV VTI:  0.122 m AV Peak Grad:      3.4 mmHg AV Mean Grad:      2.0 mmHg LVOT Vmax:         64.70 cm/s LVOT Vmean:        50.700 cm/s LVOT VTI:          0.136 m LVOT/AV VTI ratio: 1.11  AORTA Ao Root diam: 3.20 cm MITRAL VALVE               TRICUSPID VALVE MV Area (PHT): 3.40 cm    TR Peak grad:   19.4 mmHg MV Decel Time: 223 msec    TR Vmax:        220.00 cm/s MV E velocity: 85.90 cm/s                            SHUNTS                            Systemic VTI:  0.14 m                            Systemic Diam: 2.00 cm Serafina Royals MD Electronically signed by Serafina Royals MD Signature Date/Time: 09/29/2022/12:21:30 PM    Final      Medications:    amiodarone 30 mg/hr (09/30/22 0900)   heparin 1,700 Units/hr (09/30/22 1118)   norepinephrine (LEVOPHED) Adult infusion Stopped (09/30/22 0303)   piperacillin-tazobactam (ZOSYN)  IV 3.375 g (09/30/22 1114)   TPN ADULT (ION) 30 mL/hr at 09/30/22 0900   TPN ADULT (ION)      Chlorhexidine Gluconate Cloth  6 each Topical Daily   insulin aspart  0-20 Units Subcutaneous Q4H   insulin detemir  20 Units Subcutaneous Q24H   pantoprazole (PROTONIX) IV  40 mg Intravenous QHS   sodium chloride flush  10-40 mL Intracatheter Q12H   dextrose, morphine injection, ondansetron **OR** ondansetron (ZOFRAN) IV, mouth rinse, oxyCODONE, prochlorperazine **OR** prochlorperazine, sodium chloride flush  Assessment/ Plan:  66 y.o. male with  medical problems of atrial fibrillation, history of  cardioversion, colon mass, hyperlipidemia, type 2 diabetes admitted on 09/24/2022 for S/P right colectomy [Z90.49] S/P partial resection of colon [Z90.49]  1.  Acute kidney injury Baseline creatinine 1.06 from 09/14/2022.  It has increased to 4.12->4.6 as of today.  Urine output is dwindling and patient is almost oligoanuric.   AKI likely secondary to ATN, oligoanuric Renal function is expected to get worse over the next day or 2.  Electrolytes and volume status are acceptable at present.  No acute indication for dialysis at the moment but if the trend continues, will require CRRT or intermittent hemodialysis in the near future. We will follow closely.   2.  Acute respiratory failure Extubated now.    LOS: [place] 10/25/20233:54 PM  [place], [place]  Note: This note was prepared with Dragon dictation. Any transcription errors are unintentional

## 2022-10-01 DIAGNOSIS — Z9049 Acquired absence of other specified parts of digestive tract: Secondary | ICD-10-CM | POA: Diagnosis not present

## 2022-10-01 LAB — GLUCOSE, CAPILLARY
Glucose-Capillary: 161 mg/dL — ABNORMAL HIGH (ref 70–99)
Glucose-Capillary: 179 mg/dL — ABNORMAL HIGH (ref 70–99)
Glucose-Capillary: 200 mg/dL — ABNORMAL HIGH (ref 70–99)
Glucose-Capillary: 213 mg/dL — ABNORMAL HIGH (ref 70–99)
Glucose-Capillary: 271 mg/dL — ABNORMAL HIGH (ref 70–99)

## 2022-10-01 LAB — CBC WITH DIFFERENTIAL/PLATELET
Abs Immature Granulocytes: 0.9 10*3/uL — ABNORMAL HIGH (ref 0.00–0.07)
Basophils Absolute: 0.1 10*3/uL (ref 0.0–0.1)
Basophils Relative: 1 %
Eosinophils Absolute: 0.3 10*3/uL (ref 0.0–0.5)
Eosinophils Relative: 2 %
HCT: 25.5 % — ABNORMAL LOW (ref 39.0–52.0)
Hemoglobin: 8.1 g/dL — ABNORMAL LOW (ref 13.0–17.0)
Immature Granulocytes: 5 %
Lymphocytes Relative: 6 %
Lymphs Abs: 1.1 10*3/uL (ref 0.7–4.0)
MCH: 23.7 pg — ABNORMAL LOW (ref 26.0–34.0)
MCHC: 31.8 g/dL (ref 30.0–36.0)
MCV: 74.6 fL — ABNORMAL LOW (ref 80.0–100.0)
Monocytes Absolute: 2.1 10*3/uL — ABNORMAL HIGH (ref 0.1–1.0)
Monocytes Relative: 12 %
Neutro Abs: 13.2 10*3/uL — ABNORMAL HIGH (ref 1.7–7.7)
Neutrophils Relative %: 74 %
Platelets: 211 10*3/uL (ref 150–400)
RBC: 3.42 MIL/uL — ABNORMAL LOW (ref 4.22–5.81)
RDW: 15.3 % (ref 11.5–15.5)
WBC: 17.7 10*3/uL — ABNORMAL HIGH (ref 4.0–10.5)
nRBC: 0.5 % — ABNORMAL HIGH (ref 0.0–0.2)

## 2022-10-01 LAB — BASIC METABOLIC PANEL
Anion gap: 10 (ref 5–15)
BUN: 52 mg/dL — ABNORMAL HIGH (ref 8–23)
CO2: 22 mmol/L (ref 22–32)
Calcium: 8.4 mg/dL — ABNORMAL LOW (ref 8.9–10.3)
Chloride: 101 mmol/L (ref 98–111)
Creatinine, Ser: 4.39 mg/dL — ABNORMAL HIGH (ref 0.61–1.24)
GFR, Estimated: 14 mL/min — ABNORMAL LOW (ref >60)
Glucose, Bld: 165 mg/dL — ABNORMAL HIGH (ref 70–99)
Potassium: 3.9 mmol/L (ref 3.5–5.1)
Sodium: 133 mmol/L — ABNORMAL LOW (ref 135–145)

## 2022-10-01 LAB — CULTURE, RESPIRATORY W GRAM STAIN

## 2022-10-01 LAB — HEPARIN LEVEL (UNFRACTIONATED)
Heparin Unfractionated: <0.1 IU/mL — ABNORMAL LOW (ref 0.30–0.70)
Heparin Unfractionated: 0.1 IU/mL — ABNORMAL LOW (ref 0.30–0.70)
Heparin Unfractionated: 0.18 IU/mL — ABNORMAL LOW (ref 0.30–0.70)

## 2022-10-01 LAB — MAGNESIUM: Magnesium: 2.6 mg/dL — ABNORMAL HIGH (ref 1.7–2.4)

## 2022-10-01 LAB — PHOSPHORUS: Phosphorus: 2.1 mg/dL — ABNORMAL LOW (ref 2.5–4.6)

## 2022-10-01 LAB — PREPARE RBC (CROSSMATCH)

## 2022-10-01 MED ORDER — TRACE MINERALS CU-MN-SE-ZN 300-55-60-3000 MCG/ML IV SOLN
INTRAVENOUS | Status: AC
  Filled 2022-10-01: qty 964.8

## 2022-10-01 NOTE — Progress Notes (Addendum)
Occupational Therapy Treatment Patient Details Name: Nicolas Zuniga MRN: [id number] DOB: 1956/07/13 Today's Date: 10/01/2022   History of present illness 66 y.o. male  2 Days Post-Op s/p exploratory laparotomy, abdominal washout, and diverting loop ileostomy for intra-abdominal purulent fluid after initial open right colectomy with open cholecystectomy for right colon cancer and cholelithiasis on 10/19   OT comments  Nicolas Zuniga was seen for OT treatment on this date. Upon arrival to room pt reclined in chair, reports pain and requesting to return to bed. Noted to have O2 doffed in chair, left off during mobility with SpO2 98% on RA. RN in to assist with lines mgmt. MOD A + RW sit<>stand from low chair height chair and bed>chair SPT. Pt reports 6/10 abdominal pain, RN notified. Reviewed HEP and IS use, wife at bed side good return verbalization. Pt making good progress toward goals, will continue to follow POC. Discharge recommendation remains appropriate.     Recommendations for follow up therapy are one component of a multi-disciplinary discharge planning process, led by the attending physician.  Recommendations may be updated based on patient status, additional functional criteria and insurance authorization.    Follow Up Recommendations  Acute inpatient rehab (3hours/day)    Assistance Recommended at Discharge Frequent or constant Supervision/Assistance  Patient can return home with the following  Two people to help with walking and/or transfers;Two people to help with bathing/dressing/bathroom;Help with stairs or ramp for entrance   Equipment Recommendations  Other (comment) (defer)    Recommendations for Other Services Rehab consult    Precautions / Restrictions Precautions Precautions: Fall Restrictions Weight Bearing Restrictions: No       Mobility Bed Mobility Overal bed mobility: Needs Assistance Bed Mobility: Sit to Supine       Sit to supine: Max assist   General bed  mobility comments: assist for BLE and trunk 2/2 abdominal pain    Transfers Overall transfer level: Needs assistance Equipment used: Rolling walker (2 wheels) Transfers: Bed to chair/wheelchair/BSC       Step pivot transfers: Mod assist, +2 safety/equipment     General transfer comment: RN assist for line mgmt. MOD A to rise from chair height and cues for stepping chair>bed     Balance Overall balance assessment: Needs assistance Sitting-balance support: Feet supported, Single extremity supported Sitting balance-Leahy Scale: Fair     Standing balance support: Bilateral upper extremity supported Standing balance-Leahy Scale: Fair                             ADL either performed or assessed with clinical judgement   ADL Overall ADL's : Needs assistance/impaired                                       General ADL Comments: MOD A for simulated BSC t/f. MAX A for LB access at bed level.      Cognition Arousal/Alertness: Awake/alert Behavior During Therapy: WFL for tasks assessed/performed Overall Cognitive Status: Within Functional Limits for tasks assessed                                 General Comments: fair command following        Exercises Other Exercises Other Exercises: reviewed edema mgmt and IS HEP     Pertinent Vitals/ Pain  Pain Assessment Pain Assessment: 0-10 Pain Score: 6  Pain Location: abdomen Pain Descriptors / Indicators: Discomfort, Operative site guarding Pain Intervention(s): Patient requesting pain meds-RN notified   Frequency  Min 3X/week        Progress Toward Goals  OT Goals(current goals can now be found in the care plan section)  Progress towards OT goals: Progressing toward goals  Acute Rehab OT Goals Patient Stated Goal: to returnt o PLOF OT Goal Formulation: With patient/family Time For Goal Achievement: 10/14/22 Potential to Achieve Goals: Good ADL Goals Pt Will Perform  Grooming: with min assist;standing Pt Will Perform Lower Body Dressing: with min assist;sit to/from stand;with caregiver independent in assisting Pt Will Transfer to Toilet: with min assist;ambulating;bedside commode  Plan Discharge plan remains appropriate;Frequency remains appropriate    Co-evaluation                 AM-PAC OT "6 Clicks" Daily Activity     Outcome Measure   Help from another person eating meals?: Total (NPO) Help from another person taking care of personal grooming?: A Little Help from another person toileting, which includes using toliet, bedpan, or urinal?: A Lot Help from another person bathing (including washing, rinsing, drying)?: A Lot Help from another person to put on and taking off regular upper body clothing?: A Lot Help from another person to put on and taking off regular lower body clothing?: A Lot 6 Click Score: 12    End of Session    OT Visit Diagnosis: Other abnormalities of gait and mobility (R26.89);Muscle weakness (generalized) (M62.81)   Activity Tolerance Patient tolerated treatment well   Patient Left in bed;with call bell/phone within reach   Nurse Communication Mobility status        Time: 9030-0923 OT Time Calculation (min): 22 min  Charges: OT General Charges $OT Visit: 1 Visit OT Treatments $Therapeutic Activity: 8-22 mins  Dessie Coma, M.S. OTR/L  10/01/22, 1:35 PM  ascom [phone]

## 2022-10-01 NOTE — Consult Note (Signed)
ANTICOAGULATION CONSULT NOTE  Pharmacy Consult for Heparin Infusion Indication: atrial fibrillation  Patient Measurements: Height: '6\' 2"'$  (188 cm) Weight: (!) 139.3 kg (307 lb 1.6 oz) IBW/kg (Calculated) : 82.2 Heparin Dosing Weight: 107.3 kg  Labs: Recent Labs    09/28/22 0458 09/28/22 0459 09/28/22 1945 09/28/22 2232 09/29/22 0106 09/29/22 1041 09/29/22 2042 09/30/22 0413 09/30/22 1358 10/01/22 0136  HGB  --    < >  --   --  7.6*  --   --  7.6*  --  8.1*  HCT  --    < >  --   --  25.4*  --   --  24.2*  --  25.5*  PLT  --    < >  --   --  254  --   --  199  --  211  LABPROT 23.4*  --   --   --   --   --   --   --   --   --   INR 2.1*  --   --   --   --   --   --   --   --   --   HEPARINUNFRC  --    < >  --   --   --   --   --  <0.10* <0.10* <0.10*  CREATININE  --    < >  --   --  2.97*   < > 4.33* 4.61*  --  4.39*  TROPONINIHS  --    < > 80* 109* 70*  --   --   --   --   --    < > = values in this interval not displayed.     Estimated Creatinine Clearance: 24.6 mL/min (A) (by C-G formula based on SCr of 4.39 mg/dL (H)).   Medical History: Past Medical History:  Diagnosis Date   Adenocarcinoma of colon ([place]) 09/08/2022   Cholelithiasis    Chronic gastritis    Colon polyps    Coronary artery calcification seen on CT scan    Diastolic dysfunction    a.) TTE 04/09/2022: EF >55%, triv TR, G1DD   Diverticulosis    Hypertension    Long term current use of anticoagulant    a.) apixaban   Nephrolithiasis    Paroxysmal A-fib ([place])    a.) CHA2DS2VASc = 3 (age, HTN, T2DM);  b.) s/p DCCV 07/09/2016 (100 J x1), 12/22/2018 (120 J x1), 08/18/2021 (120 J x1), 03/11/2022 (120 J x1), 03/31/2022 (120 J x1); c.) rate/rhythm maintained on oral flecanide + metoprolol succinate; chronically anticoagulated with apixaban   T2DM (type 2 diabetes mellitus) ([place])    Assessment: Nicolas Zuniga is a 66 y.o. male presenting for laparoscopic R colectomy and cholecystectomy. PMH significant  for recently diagnosed adenocarcinoma of the colon, DM, HTN, Afib on metoprolol, flecainide, and apixaban. Patient was on [facility] PTA per chart review, last dose of apixaban PTA reported to be on 10/16. Last enoxaparin dose 10/20 AM. Pharmacy has been consulted to initiate and manage heparin infusion.   Baseline Labs: aPTT 44, PT 22.1, INR 2.0, Hgb 9.3, Hct 31.1, Plt 208   Patient previously on heparin this admission. Noted patient required multiple titrations due to subtherapeutic levels, last rate with levels was 1950 units/hr with HL 0.13. Anticoagulation was subsequently discontinued for ExLap.  Goal of Therapy:  Heparin level 0.3 - 0.5 units/ml (no bolus protocol) Monitor platelets by anticoagulation protocol: Yes   Plan:  --  10/26'@0136'$  HL<0.10, subtherapeutic --Will increase heparin rate to 2200 units/hr --Will check heparin level 8 hours after rate change --Daily CBC per protocol while on IV heparin; note worsening anemia & thrombocytopenia  Thank you for allowing pharmacy to be a part of this patient's care.  Pearla Dubonnet, PharmD Clinical Pharmacist   10/01/2022 4:02 AM

## 2022-10-01 NOTE — Progress Notes (Signed)
[name]       Patient ID: Nicolas Zuniga MRN: [id number] DOB/AGE: 1956/02/21 66 y.o.  Admit date: 09/24/2022 Referring Physician Sharion Settler, NP  Primary Physician Dr. Hortencia Pilar ([facility])  Primary Cardiologist Nehemiah Massed Reason for Consultation Af RVR  HPI: Nicolas Zuniga is a 60VPX with a PMH of paroxysmal atrial fibrillation (on flecainide, metoprolol, and Eliquis), HFpEF (EF >55, G1 DD 04/2022), type II diabetes, hypertension, recently diagnosed adenocarcinoma of the colon who presented to [facility] 09/24/2022 for and elective laparoscopic right colectomy ultimately converted to open right colectomy and cholecystectomy.  Postoperative course was complicated by hypotension and bradycardia the evening of postop day 1, concerning for beta-blocker toxicity for which he was started on glucagon infusion.  Rapid response was called overnight on 10/21 for atrial fibrillation with RVR and questionable VT, was started on amiodarone and diltiazem infusions with some heart rate response.  On 10/23 he underwent exploratory laparotomy, abdominal washout, and diverting loop ileostomy for intra-abdominal purulent fluid seen on CT abdomen pelvis.  Postoperatively, he remained intubated and requiring vasopressor support.  He converted to atrial flutter with variable conduction the evening of 10/23.  Interval history: -Continues to feel better, seen after physical therapy helped him get up from the bed to the recliner -Says his abdominal pain is a 5/10 when moving around, denies palpitations, chest pain, shortness of breath. -Still n.p.o. this morning, NGT clamped. -On nasal cannula, Hgb stable while on heparin infusion, renal function slightly improved and is making more urine. -Remains in AF on telemetry, primarily rate controlled in the mid 90s -transferred to stepdown   Past Medical History:  Diagnosis Date   Adenocarcinoma of colon ([place]) 09/08/2022    Cholelithiasis    Chronic gastritis    Colon polyps    Coronary artery calcification seen on CT scan    Diastolic dysfunction    a.) TTE 04/09/2022: EF >55%, triv TR, G1DD   Diverticulosis    Hypertension    Long term current use of anticoagulant    a.) apixaban   Nephrolithiasis    Paroxysmal A-fib ([place])    a.) CHA2DS2VASc = 3 (age, HTN, T2DM);  b.) s/p DCCV 07/09/2016 (100 J x1), 12/22/2018 (120 J x1), 08/18/2021 (120 J x1), 03/11/2022 (120 J x1), 03/31/2022 (120 J x1); c.) rate/rhythm maintained on oral flecanide + metoprolol succinate; chronically anticoagulated with apixaban   T2DM (type 2 diabetes mellitus) ([place])     Past Surgical History:  Procedure Laterality Date   CARDIOVERSION N/A 12/22/2018   Procedure: CARDIOVERSION (CATH LAB);  Surgeon: Corey Skains, MD;  Location: [facility] ORS;  Service: Cardiovascular;  Laterality: N/A;   CARDIOVERSION N/A 08/18/2021   Procedure: CARDIOVERSION;  Surgeon: Corey Skains, MD;  Location: [facility] ORS;  Service: Cardiovascular;  Laterality: N/A;   CARDIOVERSION N/A 03/11/2022   Procedure: CARDIOVERSION;  Surgeon: Corey Skains, MD;  Location: [facility] ORS;  Service: Cardiovascular;  Laterality: N/A;   CARDIOVERSION N/A 03/31/2022   Procedure: CARDIOVERSION;  Surgeon: Corey Skains, MD;  Location: [facility] ORS;  Service: Cardiovascular;  Laterality: N/A;   CHOLECYSTECTOMY N/A 09/24/2022   Procedure: LAPAROSCOPIC CHOLECYSTECTOMY;  Surgeon: Jules Husbands, MD;  Location: [facility] ORS;  Service: General;  Laterality: N/A;   COLONOSCOPY WITH PROPOFOL N/A 09/08/2022   Procedure: COLONOSCOPY WITH PROPOFOL;  Surgeon: Lucilla Lame, MD;  Location: [facility] ENDOSCOPY;  Service: Endoscopy;  Laterality: N/A;   ELECTROPHYSIOLOGIC STUDY N/A 07/09/2016   Procedure: CARDIOVERSION;  Surgeon: Earlyne Iba  Richardson Dopp, MD;  Location: [facility] ORS;  Service: Cardiovascular;  Laterality: N/A;   LAPAROSCOPIC RIGHT COLECTOMY N/A 09/24/2022   Procedure: LAPAROSCOPIC RIGHT COLECTOMY,  RNFA to assist;  Surgeon: Jules Husbands, MD;  Location: [facility] ORS;  Service: General;  Laterality: N/A;   LAPAROTOMY N/A 09/28/2022   Procedure: EXPLORATORY LAPAROTOMY WITH OSTOMY CREATION;  Surgeon: Jules Husbands, MD;  Location: [facility] ORS;  Service: General;  Laterality: N/A;   TEE WITHOUT CARDIOVERSION N/A 07/09/2016   Procedure: TRANSESOPHAGEAL ECHOCARDIOGRAM (TEE);  Surgeon: Dionisio David, MD;  Location: [facility] ORS;  Service: Cardiovascular;  Laterality: N/A;   web fingers repaired     as a child    Medications Prior to Admission  Medication Sig Dispense Refill Last Dose   bisacodyl (DULCOLAX) 5 MG EC tablet Take all 4 tablets at 8 am the morning prior to your surgery. 4 tablet 0 09/23/2022   Cholecalciferol (VITAMIN D) 50 MCG (2000 UT) CAPS Take 2,000 Units by mouth daily.   Past Week   flecainide (TAMBOCOR) 100 MG tablet Take 100 mg by mouth 2 (two) times daily.   09/24/2022   glipiZIDE (GLUCOTROL XL) 10 MG 24 hr tablet Take 10 mg by mouth 2 (two) times daily.    09/24/2022   metFORMIN (GLUCOPHAGE-XR) 500 MG 24 hr tablet Take 500 mg by mouth daily with breakfast.   Past Month   metoprolol (LOPRESSOR) 100 MG tablet Take 100 mg by mouth 2 (two) times daily.   09/24/2022   metroNIDAZOLE (FLAGYL) 500 MG tablet Take 2 tablets at 8AM, take 2 tablets at [facility], and take 2 tablets at 8PM the day prior to your surgery 6 tablet 0 09/23/2022   Multiple Vitamins-Minerals (MULTIVITAMIN WITH MINERALS) tablet Take 1 tablet by mouth daily with breakfast.    Past Week   neomycin (MYCIFRADIN) 500 MG tablet Take 2 tablet at 8am, take 2 tablets at 2pm, and take 2 tablets at 8pm the day prior to your surgery 6 tablet 0 09/23/2022   Omega-3 Fatty Acids (FISH OIL) 1000 MG CAPS Take 1,000 mg by mouth daily.   Past Week   OZEMPIC, 1 MG/DOSE, 4 MG/3ML SOPN Inject 1 mg into the skin every Wednesday. Patient stopped taking these medications   Past Month   polyethylene glycol powder (MIRALAX) 17 GM/SCOOP powder Mix full  container in 64 ounces of Gatorade or other clear liquid. NO Red 238 g 0 09/23/2022   Turmeric 400 MG CAPS Take 400 mg by mouth daily.   Past Week   vitamin B-12 (CYANOCOBALAMIN) 100 MCG tablet Take 500 mcg by mouth daily.   Past Week   Vitamin E 400 units TABS Take 400 Units by mouth daily.   Past Week   apixaban (ELIQUIS) 5 MG TABS tablet Take 5 mg by mouth 2 (two) times daily.   09/21/2022   Coenzyme Q10 (COQ10) 100 MG CAPS Take 100 mg by mouth daily.   09/21/2022   Social History   Socioeconomic History   Marital status: Married    Spouse name: Not on file   Number of children: Not on file   Years of education: Not on file   Highest education level: Not on file  Occupational History   Not on file  Tobacco Use   Smoking status: Never    Passive exposure: Never   Smokeless tobacco: Never  Vaping Use   Vaping Use: Never used  Substance and Sexual Activity   Alcohol use: No   Drug use:  No   Sexual activity: Not on file  Other Topics Concern   Not on file  Social History Narrative   Lives with wife, [name], with on pet, cat.   Social Determinants of Health   Financial Resource Strain: Not on file  Food Insecurity: No Food Insecurity (09/24/2022)   Hunger Vital Sign    Worried About Running Out of Food in the Last Year: Never true    Ran Out of Food in the Last Year: Never true  Transportation Needs: No Transportation Needs (09/24/2022)   PRAPARE - Hydrologist (Medical): No    Lack of Transportation (Non-Medical): No  Physical Activity: Not on file  Stress: Not on file  Social Connections: Not on file  Intimate Partner Violence: Not At Risk (09/24/2022)   Humiliation, Afraid, Rape, and Kick questionnaire    Fear of Current or Ex-Partner: No    Emotionally Abused: No    Physically Abused: No    Sexually Abused: No    Family History  Problem Relation Age of Onset   Varicose Veins Mother    Heart disease Mother    Diabetes Mother     COPD Father      Vitals:   10/01/22 0400 10/01/22 0427 10/01/22 0500 10/01/22 0600  BP: 134/69  133/65 126/65  Pulse: 85  (!) 101 92  Resp: (!) 26  (!) 27 (!) 23  Temp: 98.4 F (36.9 C)     TempSrc: Oral     SpO2: 100%  100% 100%  Weight:  (!) 138.9 kg    Height:        PHYSICAL EXAM General: Pleasant acutely ill-appearing middle-aged Caucasian male, sitting upright in recliner, wife at bedside HEENT:  Normocephalic and atraumatic. NGT present left nare Neck:  No JVD.  Lungs: Normal respiratory effort on oxygen by nasal cannula, decreased breath sounds without appreciable crackles or wheezes.   Heart: irregularly irregular rhythm with controlled rate. Normal S1 and S2 without gallops or murmurs.  Abdomen: Ileostomy present RUQ. Dry dressing in place over midline incision. Msk: Generalized weakness. Extremities: Warm and well perfused. No clubbing, cyanosis.  Trace peripheral and upper extremity edema. SCDs Neuro: Alert and oriented X 3.   Psych: Appropriate for situation  Labs: Basic Metabolic Panel: Recent Labs    09/30/22 0413 10/01/22 0136  NA 131* 133*  K 3.4* 3.9  CL 99 101  CO2 22 22  GLUCOSE 150* 165*  BUN 46* 52*  CREATININE 4.61* 4.39*  CALCIUM 8.4* 8.4*  MG 2.6* 2.6*  PHOS 1.6* 2.1*    Liver Function Tests: Recent Labs    09/28/22 1449  AST 22  ALT 10  ALKPHOS 50  BILITOT 0.8  PROT 4.4*  ALBUMIN 2.2*    No results for input(s): "LIPASE", "AMYLASE" in the last 72 hours. CBC: Recent Labs    09/30/22 0413 10/01/22 0136  WBC 14.7* 17.7*  NEUTROABS 10.6* 13.2*  HGB 7.6* 8.1*  HCT 24.2* 25.5*  MCV 74.5* 74.6*  PLT 199 211    Cardiac Enzymes: Recent Labs    09/28/22 1945 09/28/22 2232 09/29/22 0106  TROPONINIHS 80* 109* 70*    BNP: Recent Labs    09/29/22 1025  BNP 476.6*    D-Dimer: No results for input(s): "DDIMER" in the last 72 hours. Hemoglobin A1C: No results for input(s): "HGBA1C" in the last 72 hours. Fasting Lipid  Panel: Recent Labs    09/30/22 0413  TRIG 178*  Thyroid Function Tests: No results for input(s): "TSH", "T4TOTAL", "T3FREE", "THYROIDAB" in the last 72 hours.  Invalid input(s): "FREET3"  Anemia Panel: No results for input(s): "VITAMINB12", "FOLATE", "FERRITIN", "TIBC", "IRON", "RETICCTPCT" in the last 72 hours.   Radiology: ECHOCARDIOGRAM COMPLETE  Result Date: 09/29/2022    ECHOCARDIOGRAM REPORT   Patient Name:   Nicolas Zuniga Date of Exam: 09/29/2022 Medical Rec #:  [id number]     Height:       74.0 in Accession #:    [id number]    Weight:       260.1 lb Date of Birth:  02/15/1956      BSA:          2.430 m Patient Age:    81 years      BP:           126/74 mmHg Patient Gender: M             HR:           83 bpm. Exam Location:  [facility] Procedure: 2D Echo, Color Doppler and Cardiac Doppler Indications:     Abnormal ECG R94.31  History:         Patient has prior history of Echocardiogram examinations, most                  recent 07/09/2016. Risk Factors:Hypertension and Diabetes.                  Paroxysmal Afib.  Sonographer:     Sherrie Sport Referring Phys:  [id number] Teressa Lower Diagnosing Phys: Serafina Royals MD  Sonographer Comments: Echo performed with patient supine and on artificial respirator, Technically challenging study due to limited acoustic windows and no subcostal window. IMPRESSIONS  1. Left ventricular ejection fraction, by estimation, is 65 to 70%. The left ventricle has normal function. The left ventricle has no regional wall motion abnormalities. Left ventricular diastolic parameters were normal.  2. Right ventricular systolic function is normal. The right ventricular size is normal.  3. The mitral valve is normal in structure. Trivial mitral valve regurgitation.  4. The aortic valve is normal in structure. Aortic valve regurgitation is not visualized. FINDINGS  Left Ventricle: Left ventricular ejection fraction, by estimation, is 65 to 70%. The left ventricle has normal function.  The left ventricle has no regional wall motion abnormalities. The left ventricular internal cavity size was small. There is no left ventricular hypertrophy. Left ventricular diastolic parameters were normal. Right Ventricle: The right ventricular size is normal. No increase in right ventricular wall thickness. Right ventricular systolic function is normal. Left Atrium: Left atrial size was normal in size. Right Atrium: Right atrial size was normal in size. Pericardium: There is no evidence of pericardial effusion. Mitral Valve: The mitral valve is normal in structure. Trivial mitral valve regurgitation. Tricuspid Valve: The tricuspid valve is normal in structure. Tricuspid valve regurgitation is trivial. Aortic Valve: The aortic valve is normal in structure. Aortic valve regurgitation is not visualized. Aortic valve mean gradient measures 2.0 mmHg. Aortic valve peak gradient measures 3.4 mmHg. Aortic valve area, by VTI measures 3.50 cm. Pulmonic Valve: The pulmonic valve was normal in structure. Pulmonic valve regurgitation is not visualized. Aorta: The aortic root and ascending aorta are structurally normal, with no evidence of dilitation. IAS/Shunts: No atrial level shunt detected by color flow Doppler.  LEFT VENTRICLE PLAX 2D LVIDd:         4.60 cm LVIDs:  3.30 cm LV PW:         1.20 cm LV IVS:        0.80 cm LVOT diam:     2.00 cm LV SV:         43 LV SV Index:   18 LVOT Area:     3.14 cm  RIGHT VENTRICLE RV Basal diam:  4.10 cm RV Mid diam:    3.80 cm RV S prime:     11.40 cm/s TAPSE (M-mode): 2.0 cm LEFT ATRIUM           Index        RIGHT ATRIUM           Index LA diam:      3.10 cm 1.28 cm/m   RA Area:     14.80 cm LA Vol (A2C): 40.0 ml 16.46 ml/m  RA Volume:   37.20 ml  15.31 ml/m LA Vol (A4C): 70.1 ml 28.84 ml/m  AORTIC VALVE AV Area (Vmax):    2.22 cm AV Area (Vmean):   2.37 cm AV Area (VTI):     3.50 cm AV Vmax:           91.60 cm/s AV Vmean:          67.100 cm/s AV VTI:            0.122  m AV Peak Grad:      3.4 mmHg AV Mean Grad:      2.0 mmHg LVOT Vmax:         64.70 cm/s LVOT Vmean:        50.700 cm/s LVOT VTI:          0.136 m LVOT/AV VTI ratio: 1.11  AORTA Ao Root diam: 3.20 cm MITRAL VALVE               TRICUSPID VALVE MV Area (PHT): 3.40 cm    TR Peak grad:   19.4 mmHg MV Decel Time: 223 msec    TR Vmax:        220.00 cm/s MV E velocity: 85.90 cm/s                            SHUNTS                            Systemic VTI:  0.14 m                            Systemic Diam: 2.00 cm Serafina Royals MD Electronically signed by Serafina Royals MD Signature Date/Time: 09/29/2022/12:21:30 PM    Final    DG Abd 1 View  Result Date: 09/28/2022 CLINICAL DATA:  Enteric catheter placement, postop EXAM: ABDOMEN - 1 VIEW COMPARISON:  09/26/2022 FINDINGS: Frontal view of the lower chest and upper abdomen demonstrates enteric catheter passing below diaphragm, tip projecting over the gastric antrum. Left basilar consolidation compatible with atelectasis. Bowel gas pattern is unremarkable. IMPRESSION: 1. Enteric catheter tip projecting over the gastric antrum. Electronically Signed   By: Randa Ngo M.D.   On: 09/28/2022 15:52   DG Chest Port 1 View  Result Date: 09/28/2022 CLINICAL DATA:  Postop, intubated EXAM: PORTABLE CHEST 1 VIEW COMPARISON:  09/27/2022 FINDINGS: Single frontal view of the chest demonstrates endotracheal tube overlying tracheal air column tip at level of thoracic inlet. Enteric catheter passes below diaphragm  tip excluded by collimation. Right-sided PICC tip overlies the right atrium. Numerous cardiac leads overlie the central chest. The cardiac silhouette is unremarkable. Lung volumes are diminished, with left basilar consolidation most consistent with atelectasis. Trace left pleural effusion. No pneumothorax. No acute bony abnormalities. IMPRESSION: 1. Support devices as above. 2. Increasing left basilar consolidation, favor atelectasis over pneumonia. 3. Trace left pleural  effusion. Electronically Signed   By: Randa Ngo M.D.   On: 09/28/2022 15:51   CT ABDOMEN PELVIS WO CONTRAST  Result Date: 09/28/2022 CLINICAL DATA:  Status post right hemicolectomy 09/24/2022. Anastomotic complication suspected. EXAM: CT ABDOMEN AND PELVIS WITHOUT CONTRAST TECHNIQUE: Multidetector CT imaging of the abdomen and pelvis was performed following the standard protocol without IV contrast. RADIATION DOSE REDUCTION: This exam was performed according to the departmental dose-optimization program which includes automated exposure control, adjustment of the mA and/or kV according to patient size and/or use of iterative reconstruction technique. COMPARISON:  09/27/2022 FINDINGS: Lower chest: Bibasilar atelectasis noted with tiny bilateral pleural effusions. Hepatobiliary: No suspicious focal abnormality in the liver on this study without intravenous contrast. Gallbladder is surgically absent. No intrahepatic or extrahepatic biliary dilation. Pancreas: No focal mass lesion. No dilatation of the main duct. No intraparenchymal cyst. No peripancreatic edema. Spleen: No splenomegaly. No focal mass lesion. Adrenals/Urinary Tract: No adrenal nodule or mass. Contrast in the kidneys compatible with residual from yesterday's infuse scan. Small cyst noted left kidney. No evidence for hydroureter. The urinary bladder appears normal for the degree of distention. Stomach/Bowel: Stomach is distended with gas and fluid. Duodenum is normally positioned as is the ligament of Treitz. No small bowel wall thickening. No small bowel dilatation. The distal ileum at the ileocolic anastomosis in the proximal transverse colon at the anastomosis is well opacified with enteric contrast material. While there is no evidence for gross contrast extravasation at the level of the anastomosis, the small fluid collection identified on yesterday's study has increased in attenuation in the interval (26 Hounsfield units previously versus  67 Hounsfield units today). This increase in attenuation is concerning for some trace spill of contrast from the anastomosis into the collection although blood products or infectious debris could cause increase in attenuation. This finding is associated with a slight increase in fluid volume in the right paracolic gutter. Diverticular changes are seen in the left colon as before. Vascular/Lymphatic: There is mild atherosclerotic calcification of the abdominal aorta without aneurysm. There is no gastrohepatic or hepatoduodenal ligament lymphadenopathy. No retroperitoneal or mesenteric lymphadenopathy. No pelvic sidewall lymphadenopathy. Reproductive: The hypoattenuating collection seen previously in the prostate gland are less prominent today. Other: Free fluid and free gas in the peritoneal cavity is again noted. Free gas is not unexpected 4 days out from surgery. As noted above, the volume of fluid in the right paracolic gutter is slightly increased in the interval. Musculoskeletal: Gas in the midline subcutaneous tissues deep to the staple line has increased in the interval No worrisome lytic or sclerotic osseous abnormality. IMPRESSION: 1. While there is no evidence for gross enteric contrast extravasation at the level of the ileocolic anastomosis, the small fluid collection adjacent to the anastomosis identified on yesterday's study has increased in attenuation in the interval. This increase in attenuation is concerning for some trace spill of contrast from the anastomosis into the collection. 2. Slight increase in fluid volume in the right paracolic gutter with similar fluid volume around the liver and in the left paracolic gutter. Collections of interloop mesenteric fluid are not substantially  changed. 3. Free gas in the peritoneal cavity is not unexpected 4 days out from surgery. 4. Gas in the midline subcutaneous tissues deep to the staple line has increased in the interval. 5. Contrast in the kidneys  compatible with residual from yesterday's infused scan. 6. Bibasilar atelectasis with tiny bilateral pleural effusions. 7. Aortic Atherosclerosis (ICD10-I70.0). Findings were Dr. Dahlia Byes at approximately 1105 hours on 09/28/2022. Electronically Signed   By: Misty Stanley M.D.   On: 09/28/2022 11:22   DG Chest Port 1 View  Result Date: 09/27/2022 CLINICAL DATA:  PICC line placement. EXAM: PORTABLE CHEST 1 VIEW COMPARISON:  Abdominal series 09/26/2022. CT abdomen and pelvis 09/27/2022 FINDINGS: Shallow inspiration with atelectasis in the lung bases. Cardiac enlargement. No airspace disease or consolidation in the lungs. A right PICC line has been placed with tip over the cavoatrial junction. No pneumothorax. Pneumoperitoneum is demonstrated below the right hemidiaphragm as seen on prior studies, likely postoperative. IMPRESSION: 1. Right PICC line appears in satisfactory position. 2. Shallow inspiration with atelectasis in the lung bases. Cardiac enlargement. 3. Pneumoperitoneum as seen on prior studies, probably postoperative. Electronically Signed   By: Lucienne Capers M.D.   On: 09/27/2022 19:25   [place] EKG SITE RITE  Result Date: 09/27/2022 If Site Rite image not attached, placement could not be confirmed due to current cardiac rhythm.  CT ABDOMEN PELVIS W CONTRAST  Result Date: 09/27/2022 CLINICAL DATA:  66 year old male with RIGHT colectomy for adenocarcinoma and cholecystectomy performed on 09/24/2022. Hypotension and abdominal discomfort. EXAM: CT ABDOMEN AND PELVIS WITH CONTRAST TECHNIQUE: Multidetector CT imaging of the abdomen and pelvis was performed using the standard protocol following bolus administration of intravenous contrast. RADIATION DOSE REDUCTION: This exam was performed according to the departmental dose-optimization program which includes automated exposure control, adjustment of the mA and/or kV according to patient size and/or use of iterative reconstruction technique. CONTRAST:   154m OMNIPAQUE IOHEXOL 350 MG/ML SOLN COMPARISON:  09/17/2022 CT FINDINGS: Lower chest: Trace bilateral pleural effusions and bibasilar atelectasis noted. Hepatobiliary: The liver is unremarkable. The patient is status post cholecystectomy. There is no evidence of intrahepatic or extrahepatic biliary dilatation. Pancreas: A small amount of ill-defined fluid anterior to the pancreas is noted but the remainder of the pancreas is unremarkable. Spleen: Unremarkable Adrenals/Urinary Tract: No significant abnormalities of the kidneys, adrenal glands or bladder noted. No evidence of hydronephrosis. Stomach/Bowel: RIGHT colectomy and ileo colonic anastomosis noted. There is a small amount of ill-defined high density fluid along the suture line (images 35-39: Series 4), which may represent a small amount of extravasated contrast/leak. There is a small amount of ascites within the abdomen and pelvis with small to moderate amount of pneumoperitoneum within the abdomen. There is no evidence of focal collection/defined abscess. There is no evidence of bowel obstruction. No definite bowel wall thickening is identified. Vascular/Lymphatic: Aortic atherosclerosis. No enlarged abdominal or pelvic lymph nodes. Reproductive: 2 low-density areas within the prostate gland are noted, measuring 1.7 cm on the LEFT and 1 cm on the RIGHT. Other: Ill-defined stranding within the abdomen and pelvis noted likely recent surgical changes. Musculoskeletal: No acute or suspicious bony abnormalities are identified. IMPRESSION: 1. Ill-defined slightly high density fluid along the ileocolic suture line, which may represent a small amount of extravasated contrast/leak. Small to moderate amount of pneumoperitoneum and small amount of ascites within the abdomen and pelvis, not unexpected given surgery but nonspecific. No evidence of focal collection/defined abscess. 2. Trace bilateral pleural effusions and bibasilar atelectasis. 3. 2 low-density  areas  within the prostate gland, nonspecific but correlate with PSA. 4.  Aortic Atherosclerosis (ICD10-I70.0). Critical Value/emergent results discussed by phone at the time of interpretation on 09/27/2022 at 1:50 pm to provider DIEGO PABON , who verbally acknowledged these results. Electronically Signed   By: Margarette Canada M.D.   On: 09/27/2022 13:51   CT Angio Chest Pulmonary Embolism (PE) W or WO Contrast  Result Date: 09/27/2022 CLINICAL DATA:  Shortness of breath. EXAM: CT ANGIOGRAPHY CHEST WITH CONTRAST TECHNIQUE: Multidetector CT imaging of the chest was performed using the standard protocol during bolus administration of intravenous contrast. Multiplanar CT image reconstructions and MIPs were obtained to evaluate the vascular anatomy. RADIATION DOSE REDUCTION: This exam was performed according to the departmental dose-optimization program which includes automated exposure control, adjustment of the mA and/or kV according to patient size and/or use of iterative reconstruction technique. CONTRAST:  169m OMNIPAQUE IOHEXOL 350 MG/ML SOLN COMPARISON:  September 17, 2022. FINDINGS: Cardiovascular: Satisfactory opacification of the pulmonary arteries to the segmental level. No evidence of pulmonary embolism. Normal heart size. No pericardial effusion. Mediastinum/Nodes: No enlarged mediastinal, hilar, or axillary lymph nodes. Thyroid gland, trachea, and esophagus demonstrate no significant findings. Lungs/Pleura: No pneumothorax is noted. Minimal bilateral pleural effusions are noted with adjacent subsegmental atelectasis. Upper Abdomen: Pneumoperitoneum is noted in the visualized portion of upper abdomen consistent with the reported history of recent laparoscopic right colectomy and cholecystectomy. Musculoskeletal: No chest wall abnormality. No acute or significant osseous findings. Review of the MIP images confirms the above findings. IMPRESSION: No definite evidence of pulmonary embolus. Minimal bilateral pleural  effusions are noted with adjacent subsegmental atelectasis. Pneumoperitoneum is noted in visualized portion of upper abdomen consistent with reported history of recent laparoscopic right colectomy and cholecystectomy. Electronically Signed   By: JMarijo ConceptionM.D.   On: 09/27/2022 13:38   DG ABD ACUTE 2+V W 1V CHEST  Result Date: 09/26/2022 CLINICAL DATA:  Ileus following open right colectomy on 09/24/2022 EXAM: DG ABDOMEN ACUTE WITH 1 VIEW CHEST COMPARISON:  09/17/2022 FINDINGS: Gaseous distension of the stomach. Air is seen within the colon. No dilated loops of small bowel. Small-moderate volume pneumoperitoneum. Numerous surgical clips within the right hemiabdomen. Midline abdominal skin staples. Heart size within normal limits. Low lung volumes. Trace left pleural effusion with left basilar atelectasis. No pneumothorax. IMPRESSION: 1. Small-moderate volume pneumoperitoneum, likely related to recent surgery. 2. Gaseous distension of the stomach. No dilated loops of small bowel to suggest obstruction. Electronically Signed   By: NDavina PokeD.O.   On: 09/26/2022 10:57   CT Chest W Contrast  Result Date: 09/18/2022 CLINICAL DATA:  Colon cancer diagnosed on 09/08/2022. Elevated CEA. Staging evaluation. EXAM: CT CHEST, ABDOMEN, AND PELVIS WITH CONTRAST TECHNIQUE: Multidetector CT imaging of the chest, abdomen and pelvis was performed following the standard protocol during bolus administration of intravenous contrast. RADIATION DOSE REDUCTION: This exam was performed according to the departmental dose-optimization program which includes automated exposure control, adjustment of the mA and/or kV according to patient size and/or use of iterative reconstruction technique. CONTRAST:  1033mOMNIPAQUE IOHEXOL 300 MG/ML  SOLN COMPARISON:  Abdomen and pelvis CT dated 03/07/2020 FINDINGS: CT CHEST FINDINGS Cardiovascular: Minimal coronary artery calcification. Normal sized heart. Mediastinum/Nodes: No enlarged  mediastinal, hilar, or axillary lymph nodes. Thyroid gland, trachea, and esophagus demonstrate no significant findings. Lungs/Pleura: Minimal linear atelectasis or scarring at the left lung base. 5 x 3 mm left lower lobe nodule with a mean diameter of 4 mm on  image number 100/5. Taking differences in slice thickness into account, this demonstrates little if any change since 03/07/2020. There is also a small calcified granuloma more superiorly in the left lower lobe on image number 77/5. On image number 97/5 and coronal image number 58/6, there is a 4 x 2 mm oval nodular density with a mean diameter of 3 mm. It is difficult determine if this is a lung nodule or prominent vessel branch point. No other lung nodules seen. No pleural fluid. Musculoskeletal: Thoracic and lower cervical spine degenerative changes. No evidence of bony metastatic disease. CT ABDOMEN PELVIS FINDINGS Hepatobiliary: Multiple small noncalcified gallstones in the dependent portion of the gallbladder measuring up 5 mm in maximum diameter each. No gallbladder wall thickening or pericholecystic fluid. Unremarkable liver. No liver masses are seen. Pancreas: Unremarkable. No pancreatic ductal dilatation or surrounding inflammatory changes. Spleen: Normal in size without focal abnormality. Adrenals/Urinary Tract: Normal appearing adrenal glands. Small bilateral renal cysts. These do not need imaging follow-up. Poorly distended urinary bladder. No ureteral abnormalities seen. Stomach/Bowel: Interval concentric mass involving the right colon and ileocecal valve measuring 7.3 cm in length on coronal image number 88/6 with a maximum wall thickness of 1.7 cm on the same image. This mass is confluent with an immediately adjacent mass medially with central low density, measuring 3.8 x 3.0 cm on image number 92/3 and 4.1 cm in length on coronal image number 83/6. There are multiple adjacent lymph node medial to these masses. The largest has a short axis  diameter of 2.4 cm on image number 86/3. Multiple descending and sigmoid colon diverticula. Mild wall thickening involving the terminal ileum at the ileocecal valve. The remainder of the small bowel is unremarkable. Normal-appearing appendix and stomach. Vascular/Lymphatic: Mild atheromatous arterial calcifications without aneurysm. Multiple enlarged mesenteric lymph nodes medial to the right colon mass, as described above. No other enlarged lymph nodes seen. Reproductive: Prostate is unremarkable. Other: No abdominal wall hernia or abnormality. No abdominopelvic ascites. Musculoskeletal: Lumbar spine degenerative changes. Mild bilateral hip degenerative changes. No evidence of bony metastatic disease. IMPRESSION: 1. Interval concentric mass involving the right colon, ileocecal valve and distal terminal ileum, compatible with the patient's known colon carcinoma. No associated obstruction. 2. Adjacent mass with central low density, compatible with local extension of necrotic tumor or an enlarged, necrotic metastatic lymph node. 3. Multiple adjacent enlarged mesenteric lymph nodes medial to these masses, compatible with metastatic adenopathy. 4. Colonic diverticulosis. 5. Cholelithiasis. 6. 4 mm mean diameter left lower lobe nodule possible 3 mm mean diameter right middle lobe nodule. These are felt to have a low likelihood of representing metastatic disease and could be followed on subsequent examinations. Electronically Signed   By: Claudie Revering M.D.   On: 09/18/2022 17:04   CT Abdomen Pelvis W Contrast  Result Date: 09/18/2022 CLINICAL DATA:  Colon cancer diagnosed on 09/08/2022. Elevated CEA. Staging evaluation. EXAM: CT CHEST, ABDOMEN, AND PELVIS WITH CONTRAST TECHNIQUE: Multidetector CT imaging of the chest, abdomen and pelvis was performed following the standard protocol during bolus administration of intravenous contrast. RADIATION DOSE REDUCTION: This exam was performed according to the departmental  dose-optimization program which includes automated exposure control, adjustment of the mA and/or kV according to patient size and/or use of iterative reconstruction technique. CONTRAST:  177m OMNIPAQUE IOHEXOL 300 MG/ML  SOLN COMPARISON:  Abdomen and pelvis CT dated 03/07/2020 FINDINGS: CT CHEST FINDINGS Cardiovascular: Minimal coronary artery calcification. Normal sized heart. Mediastinum/Nodes: No enlarged mediastinal, hilar, or axillary lymph nodes. Thyroid gland,  trachea, and esophagus demonstrate no significant findings. Lungs/Pleura: Minimal linear atelectasis or scarring at the left lung base. 5 x 3 mm left lower lobe nodule with a mean diameter of 4 mm on image number 100/5. Taking differences in slice thickness into account, this demonstrates little if any change since 03/07/2020. There is also a small calcified granuloma more superiorly in the left lower lobe on image number 77/5. On image number 97/5 and coronal image number 58/6, there is a 4 x 2 mm oval nodular density with a mean diameter of 3 mm. It is difficult determine if this is a lung nodule or prominent vessel branch point. No other lung nodules seen. No pleural fluid. Musculoskeletal: Thoracic and lower cervical spine degenerative changes. No evidence of bony metastatic disease. CT ABDOMEN PELVIS FINDINGS Hepatobiliary: Multiple small noncalcified gallstones in the dependent portion of the gallbladder measuring up 5 mm in maximum diameter each. No gallbladder wall thickening or pericholecystic fluid. Unremarkable liver. No liver masses are seen. Pancreas: Unremarkable. No pancreatic ductal dilatation or surrounding inflammatory changes. Spleen: Normal in size without focal abnormality. Adrenals/Urinary Tract: Normal appearing adrenal glands. Small bilateral renal cysts. These do not need imaging follow-up. Poorly distended urinary bladder. No ureteral abnormalities seen. Stomach/Bowel: Interval concentric mass involving the right colon and  ileocecal valve measuring 7.3 cm in length on coronal image number 88/6 with a maximum wall thickness of 1.7 cm on the same image. This mass is confluent with an immediately adjacent mass medially with central low density, measuring 3.8 x 3.0 cm on image number 92/3 and 4.1 cm in length on coronal image number 83/6. There are multiple adjacent lymph node medial to these masses. The largest has a short axis diameter of 2.4 cm on image number 86/3. Multiple descending and sigmoid colon diverticula. Mild wall thickening involving the terminal ileum at the ileocecal valve. The remainder of the small bowel is unremarkable. Normal-appearing appendix and stomach. Vascular/Lymphatic: Mild atheromatous arterial calcifications without aneurysm. Multiple enlarged mesenteric lymph nodes medial to the right colon mass, as described above. No other enlarged lymph nodes seen. Reproductive: Prostate is unremarkable. Other: No abdominal wall hernia or abnormality. No abdominopelvic ascites. Musculoskeletal: Lumbar spine degenerative changes. Mild bilateral hip degenerative changes. No evidence of bony metastatic disease. IMPRESSION: 1. Interval concentric mass involving the right colon, ileocecal valve and distal terminal ileum, compatible with the patient's known colon carcinoma. No associated obstruction. 2. Adjacent mass with central low density, compatible with local extension of necrotic tumor or an enlarged, necrotic metastatic lymph node. 3. Multiple adjacent enlarged mesenteric lymph nodes medial to these masses, compatible with metastatic adenopathy. 4. Colonic diverticulosis. 5. Cholelithiasis. 6. 4 mm mean diameter left lower lobe nodule possible 3 mm mean diameter right middle lobe nodule. These are felt to have a low likelihood of representing metastatic disease and could be followed on subsequent examinations. Electronically Signed   By: Claudie Revering M.D.   On: 09/18/2022 17:04    ECHO 04/2022 EF >55%, G1 DD with  normal LV and RV systolic function  TELEMETRY reviewed by me (LT) 10/01/2022 : A-fib rates early AM 50s-60s, now 80s-90s  EKG reviewed by me: 09/25/2022 sinus rhythm rate 60s  Data reviewed by me (LT) 10/01/2022: Surgery progress note, critical care consult and progress note, hospitalist progress note, CBC, BMP, BNP, troponins, EKGs, vitals, telemetry, I's and O's  Principal Problem:   S/P right colectomy Active Problems:   Diabetes ([place])   Essential hypertension   Paroxysmal A-fib ([place])  S/P partial resection of colon   Hypotension   Hyponatremia   AKI (acute kidney injury) ([place])   Postoperative intra-abdominal abscess   Adenocarcinoma of colon (H[facility])   Persistent atrial fibrillation (HCC)   Sepsis (HCC)   Diabetic ketoacidosis without coma associated with type 2 diabetes mellitus ([place])    ASSESSMENT AND PLAN:  Nicolas Zuniga is a 46yoM with a PMH of paroxysmal atrial fibrillation (on flecainide, metoprolol, and Eliquis), diabetes, hypertension, recently diagnosed adenocarcinoma of the colon who presented to [facility] 09/24/2022 for and elective laparoscopic right colectomy ultimately converted to open right colectomy and cholecystectomy.  Postoperative course was complicated by hypotension and bradycardia the evening of postop day 1, concerning for beta-blocker toxicity for which he was started on glucagon infusion.  Rapid response was called overnight on 10/21 for atrial fibrillation with RVR and questionable VT, was started on amiodarone and diltiazem infusions with some heart rate response.  #Adenocarcinoma of the colon s/p open R colectomy & CCY 10/19 #S/p exploratory laparotomy, abdominal washout, and diverting loop ileostomy 10/23 #Intra-abdominal abscess with ?Septic shock -Remains vasopressor support entirely since 10/25. -Procalcitonin trend 21.52-18.25-20.55, leukocytosis slight worsening from 14.7-17 today  #Paroxysmal atrial fibrillation with RVR Was previously on  flecainide, metoprolol, and Eliquis which have all been held after rapid response on 10/21.  His heart rates were difficult to control, and was in A-fib in the 120s at rest with paroxysms to the 150s throughout the day of 10/23 and converted to atrial flutter with controlled ventricular response in the evening.  Bradycardic overnight so diltiazem and Precedex infusions were discontinued on 10/24. -Continue to treat causes of increased adrenergic tone including pain, infection, hypoxia -Monitor and replete electrolytes to maintain a K >4, mag >2 -Continue amiodarone infusion until able to take p.o. (will have received a total IV load of ~'3300mg'$  by 1500 on 10/26) -Favor restarting low-dose metoprolol tartrate once able to take p.o. -Hold diltiazem infusion, can restart if heart rate is uncontrolled. -Revisit ablation on an outpatient basis with Dr. Mylinda Latina. -CHA2DS2-VASc 3 (age, htn, dm2).   -Continue heparin infusion while inpatient, restart Eliquis 5 mg twice daily at discharge or once no further procedures are planned if okay from a surgical standpoint.  #Chronic HFpEF (EF 65-70% 09/2022, previous >55% 04/2022) Remains net +9 L, required IVF for hypotension earlier in hospital course.  Not significantly clinically volume overloaded, although chest x-ray shows with L pleural effusion. -BNP slightly elevated at 400 -Consider diuresis later in clinical course, but will defer to nephrology  #Acute renal failure Renal function with steady downtrend was read today plateau.  He is making some urine today. -BUN/creatinine 46/4.61 and GFR of 13 today Query ATN with significant hypotension requiring vasopressor support.  His renal function has been labile since 10/20. on day of admission his baseline was BUN/creatinine 22/1.23 and GFR >60.  He has received multiple CT scans with contrast this admission. -Nephrology consulted by primary team, appreciate their assistance  #Postoperative anemia Hgb  down trended from 9.1 to a low of 7.3 the evening of 10/23, improved to 8.2 on 10/26  This patient's plan of care was discussed and created with Dr. Nehemiah Massed and he is in agreement.  Signed: Tristan Schroeder , PA-C 10/01/2022, 1:49 PM [facility]

## 2022-10-01 NOTE — Progress Notes (Signed)
PHARMACY - TOTAL PARENTERAL NUTRITION CONSULT NOTE   Indication: takeback yesterday for anastomosic leak, intra-abdominal infection, will likely have post-op ileus  Patient Measurements: Height: '6\' 2"'$  (188 cm) Weight: (!) 138.9 kg (306 lb 3.5 oz) IBW/kg (Calculated) : 82.2 TPN AdjBW (KG): 91.1 Body mass index is 39.32 kg/m.  Assessment: Pt with hx of recently diagnosed adenocarcinoma of the colon who is currently admitted status post right colectomy, diabetes, hypertension, and A-fib status post multiple ablations  Glucose / Insulin: insulin infusion >> glargine 20u/day + SSI q4h Electrolytes: hyponatremic, hypokalemic, hypophosphatemia Renal: SCR 1.93-->1.45-->4.61-->4.39 Hepatic: AST, ALT wnl Intake/Output last 2 shifts:  10/25 0701 - 10/26 0700 In: 2054.3 [I.V.:1853.4; IV Piggyback:200.9] Out: 1915 [Urine:1150; Emesis/NG output:650; Drains:65; Stool:50] MIVF: none GI Imaging:No new pertinent imaging studies GI Surgeries / Procedures: s/p exploratory laparotomy, abdominal washout, and diverting loop ileostomy 10/23  Central access: 09/24/22 TPN start date: 09/29/22  Nutritional Goals: Goal TPN rate is 90 mL/hr (provides 145g of protein and 1648 kcals per day)  RD Assessment: Estimated Needs Total Energy Estimated Needs: 2700-3000kcal/day Total Protein Estimated Needs: 135-150g/day Total Fluid Estimated Needs: 2.2-2.5L/day  Current Nutrition:  NPO  Plan:  --Increase TPN to 90 mL/hr at 1800 --Nutritional components: Amino acids (using 15% Clinisol): 144.7 grams Dextrose: 360.7 grams  Lipids (using 20% SMOFlipids): 76 grams kCal: 2567 /24h --Electrolytes in TPN: Na 54mq/L, K 557m/L, Ca 50m22mL, Mg 50mE51m, and Phos 20mm13m. Cl:Ac 1:1 --Continue rSSI q4h + levemir 20 units/day --add thiamine 100 mg daily x 3 (day #1) --Monitor TPN labs on Mon/Thurs, daily until stable  RodneDallie Piles6/2023,7:35 AM

## 2022-10-01 NOTE — Progress Notes (Signed)
[facility] Day(s): 7.   Post op day(s): 3 Days Post-Op.   Interval History:  Patient seen and examined No acute events or new complaints overnight.  Patient reports he is doing okay this morning Abdominal soreness; no fever, chills, emesis Leukocytosis up today; now 17.4K Hgb stable to 8.1 Renal function made small improvements; sCr - 4.39; UO - 1150ccs. Nephrology on board  Mild hypophosphatemia to 2.1 NGT in place; 650 ccs; clearing some Surgical drain; 65 ccs; serosanguinous No stool from diverting ileostomy; minimal bowel sweat  Vital signs in last 24 hours: [min-max] current  Temp:  [98.1 F (36.7 C)-98.7 F (37.1 C)] 98.4 F (36.9 C) (10/26 0400) Pulse Rate:  [77-172] 92 (10/26 0600) Resp:  [17-31] 23 (10/26 0600) BP: (88-154)/(58-77) 126/65 (10/26 0600) SpO2:  [90 %-100 %] 100 % (10/26 0600) Weight:  [138.9 kg] 138.9 kg (10/26 0427)     Height: '6\' 2"'$  (188 cm) Weight: (!) 138.9 kg BMI (Calculated): 39.3   Intake/Output last 2 shifts:  10/25 0701 - 10/26 0700 In: 2054.3 [I.V.:1853.4; IV Piggyback:200.9] Out: 1915 [Urine:1150; Emesis/NG output:650; Drains:65; Stool:50]   Physical Exam:  Constitutional: Alert; awake, NAD Respiratory: No respiratory distress; on [place] Cardiovascular: Atrial fibrillation; 90-100 bpm Gastrointestinal: Soft, he does not appear tender, non-distended, no rebound/guarding. Surgical drain with serosanguinous output. Diverting loop colostomy in right abdomen; bowel sweat in bag; no significant stool  Genitourinary: Foley in place; urine clear  Integumentary: Midline laparotomy healing via secondary intention, no erythema   Labs:     Latest Ref Rng & Units 10/01/2022    1:36 AM 09/30/2022    4:13 AM 09/29/2022    1:06 AM  CBC  WBC 4.0 - 10.5 K/uL 17.7  14.7  13.4   Hemoglobin 13.0 - 17.0 g/dL 8.1  7.6  7.6   Hematocrit 39.0 - 52.0 % 25.5  24.2  25.4   Platelets 150 - 400 K/uL 211  199  254        Latest Ref Rng & Units 10/01/2022    1:36 AM 09/30/2022    4:13 AM 09/29/2022    8:42 PM  CMP  Glucose 70 - 99 mg/dL 165  150  191   BUN 8 - 23 mg/dL 52  46  44   Creatinine 0.61 - 1.24 mg/dL 4.39  4.61  4.33   Sodium 135 - 145 mmol/L 133  131  128   Potassium 3.5 - 5.1 mmol/L 3.9  3.4  3.6   Chloride 98 - 111 mmol/L 101  99  97   CO2 22 - 32 mmol/L '22  22  21   '$ Calcium 8.9 - 10.3 mg/dL 8.4  8.4  8.3      Imaging studies: No new pertinent imaging studies   Assessment/Plan: 66 y.o. male 3 Days Post-Op s/p exploratory laparotomy, abdominal washout, and diverting loop ileostomy for intra-abdominal purulent fluid after initial open right colectomy with open cholecystectomy for right colon cancer and cholelithiasis on 10/19   - Will get NGT clamping trial. Clamp NGT x4 hours and check residuals. If residuals are <150 ccs, we can consider removal vs starting CLD around NGT vs enteric feedings. I think reasonable to initiate diet if amenable   - Continue TPN for now; monitor and replete electrolytes; advance to goal when feasible.   - Continue IV Abx (Zosyn)  - Continue foley catheter for UO monitoring; sCr improved slightly, making urine; nephrology consult pending  - Monitor  abdominal examination; on-going ileostomy function  - Midline wound care: Pack daily with saline moistened gauze, cover, secure  - Continue surgical drain; monitor and record output  - Pain control prn; antiemetics prn  - Okay to transfer from step down   All of the above findings and recommendations were discussed with the patient, and the medical team.  -- Edison Simon, PA-C [facility] 10/01/2022, 11:09 AM M-F: 7am - 4pm

## 2022-10-01 NOTE — Progress Notes (Addendum)
[place], [place] 10/01/22  Subjective:   Hospital day # 7  Patient is critically ill.  Wife at bedside. Alert and oriented Currently weaned to room air Currently on amiodarone and heparin drip TPN at  60 cc/h.  Renal: 10/25 0701 - 10/26 0700 In: 2054.3 [I.V.:1853.4; IV Piggyback:200.9] Out: 1915 [Urine:1150; Emesis/NG output:650; Drains:65; Stool:50] Lab Results  Component Value Date   CREATININE 4.39 (H) 10/01/2022   CREATININE 4.61 (H) 09/30/2022   CREATININE 4.33 (H) 09/29/2022     Objective:  Vital signs in last 24 hours:  Temp:  [98.1 F (36.7 C)-98.7 F (37.1 C)] 98.4 F (36.9 C) (10/26 0400) Pulse Rate:  [77-172] 92 (10/26 0600) Resp:  [17-31] 23 (10/26 0600) BP: (88-154)/(58-77) 126/65 (10/26 0600) SpO2:  [90 %-100 %] 100 % (10/26 0600) Weight:  [138.9 kg] 138.9 kg (10/26 0427)  Weight change: -0.4 kg Filed Weights   09/27/22 0148 09/30/22 0500 10/01/22 0427  Weight: 118 kg (!) 139.3 kg (!) 138.9 kg    Intake/Output:    Intake/Output Summary (Last 24 hours) at 10/01/2022 1054 Last data filed at 10/01/2022 0900 Gross per 24 hour  Intake 1729.56 ml  Output 2395 ml  Net -665.44 ml      Physical Exam: General: ill-appearing, laying in the bed  HEENT Normocephalic  Pulm/lungs Normal breathing effort, coarse breath sounds  CVS/Heart Irregular, A-fib  Abdomen:  ostomy in place  Extremities: Dependent peripheral edema present  Neurologic: Alert, able to answer simple questions  Skin: No acute rashes          Basic Metabolic Panel:  Recent Labs  Lab 09/27/22 0332 09/28/22 0459 09/28/22 1449 09/28/22 1450 09/28/22 1807 09/29/22 0106 09/29/22 1041 09/29/22 1140 09/29/22 1509 09/29/22 2042 09/30/22 0413 10/01/22 0136  NA 133* 128*   < >  --    < > 130*   < > 126* 130* 128* 131* 133*  K 4.3 4.0   < >  --    < > 4.1   < > 3.3* 3.8 3.6 3.4* 3.9  CL 103 96*   < >  --    < > 94*   < > 93* 98 97* 99 101  CO2  18* 16*   < >  --    < > 16*   < > 17* 19* 21* 22 22  GLUCOSE 84 204*   < >  --    < > 324*   < > 190* 153* 191* 150* 165*  BUN 22 25*   < >  --    < > 29*   < > 36* 40* 44* 46* 52*  CREATININE 1.45* 1.86*   < >  --    < > 2.97*   < > 3.59* 4.12* 4.33* 4.61* 4.39*  CALCIUM 8.9 8.4*   < >  --    < > 7.5*   < > 7.8* 8.7* 8.3* 8.4* 8.4*  MG 1.5* 1.9  --   --   --  1.8  --   --   --   --  2.6* 2.6*  PHOS  --   --   --  4.4  --  3.1  --   --   --   --  1.6* 2.1*   < > = values in this interval not displayed.      CBC: Recent Labs  Lab 09/28/22 0459 09/28/22 1450 09/28/22 1807 09/29/22 0106 09/30/22 0413 10/01/22 0136  WBC 10.8*  14.2*  --  13.4* 14.7* 17.7*  NEUTROABS  --  9.3*  --  9.0* 10.6* 13.2*  HGB 9.1* 7.6* 7.3* 7.6* 7.6* 8.1*  HCT 30.1* 26.0* 25.1* 25.4* 24.2* 25.5*  MCV 79.2* 80.2  --  78.9* 74.5* 74.6*  PLT 303 259  --  254 199 211      No results found for: "HEPBSAG", "HEPBSAB", "HEPBIGM"    Microbiology:  Recent Results (from the past 240 hour(s))  MRSA Next Gen by PCR, Nasal     Status: None   Collection Time: 09/27/22  1:53 AM   Specimen: Nasal Mucosa; Nasal Swab  Result Value Ref Range Status   MRSA by PCR Next Gen NOT DETECTED NOT DETECTED Final    Comment: (NOTE) The GeneXpert MRSA Assay (FDA approved for NASAL specimens only), is one component of a comprehensive MRSA colonization surveillance program. It is not intended to diagnose MRSA infection nor to guide or monitor treatment for MRSA infections. Test performance is not FDA approved in patients less than 53 years old. Performed at [facility], [address] [place], [place] [address]   Culture, Respiratory w Gram Stain     Status: None (Preliminary result)   Collection Time: 09/29/22  8:23 AM   Specimen: Tracheal Aspirate; Respiratory  Result Value Ref Range Status   Specimen Description TRACHEAL ASPIRATE  Final   Special Requests NONE  Final   Gram Stain   Final    RARE WBC  PRESENT, PREDOMINANTLY PMN RARE BUDDING YEAST SEEN    Culture   Final    MODERATE SERRATIA MARCESCENS SUSCEPTIBILITIES TO FOLLOW CULTURE REINCUBATED FOR BETTER GROWTH    Report Status PENDING  Incomplete   Organism ID, Bacteria SERRATIA MARCESCENS  Final      Susceptibility   Serratia marcescens - MIC*    CEFAZOLIN >=64 RESISTANT Resistant     CEFEPIME <=0.12 SENSITIVE Sensitive     CEFTAZIDIME <=1 SENSITIVE Sensitive     CEFTRIAXONE 1 SENSITIVE Sensitive     CIPROFLOXACIN <=0.25 SENSITIVE Sensitive     GENTAMICIN <=1 SENSITIVE Sensitive     TRIMETH/SULFA Value in next row Sensitive      <=20 SENSITIVEPerformed at [facility], [address], [place], [place] [address]    * MODERATE SERRATIA MARCESCENS    Coagulation Studies: No results for input(s): "LABPROT", "INR" in the last 72 hours.   Urinalysis: No results for input(s): "COLORURINE", "LABSPEC", "PHURINE", "GLUCOSEU", "HGBUR", "BILIRUBINUR", "KETONESUR", "PROTEINUR", "UROBILINOGEN", "NITRITE", "LEUKOCYTESUR" in the last 72 hours.  Invalid input(s): "APPERANCEUR"    Imaging: No results found.   Medications:    amiodarone 30 mg/hr (10/01/22 0627)   heparin 2,200 Units/hr (10/01/22 0600)   piperacillin-tazobactam (ZOSYN)  IV 3.375 g (10/01/22 0853)   TPN ADULT (ION) 60 mL/hr at 10/01/22 0600   TPN ADULT (ION)      Chlorhexidine Gluconate Cloth  6 each Topical Daily   insulin aspart  0-20 Units Subcutaneous Q4H   insulin detemir  20 Units Subcutaneous Q24H   pantoprazole (PROTONIX) IV  40 mg Intravenous QHS   sodium chloride flush  10-40 mL Intracatheter Q12H   dextrose, morphine injection, ondansetron **OR** ondansetron (ZOFRAN) IV, mouth rinse, oxyCODONE, prochlorperazine **OR** prochlorperazine, sodium chloride flush  Assessment/ Plan:  66 y.o. male with  medical problems of atrial fibrillation, history of cardioversion, colon mass, hyperlipidemia, type 2 diabetes admitted on 09/24/2022 for S/P right  colectomy [Z90.49] S/P partial resection of colon [Z90.49]  1.  Acute  kidney injury Baseline creatinine 1.06 from 09/14/2022.     AKI likely secondary to ATN, oligoanuric Creatinine improved slightly overnight. UOP increased also, 1.1L in past 24 hours. Electrolytes and volume status remain acceptable.  No acute indication for dialysis. Will continue to monitor.    2.  Acute respiratory failure Weaned to room air    LOS: [place] 10/26/202310:54 AM  [place], [place]  Patient was examined and evaluated with Colon Flattery, NP.  Plan of care was formulated and discussed with patient as well as NP.  I agree with the note as documented.  Patient was sitting in chair when seen.  Clinically feels better today.  Still had significant NG output.  Serum creatinine improved today.  We are hoping that the trend will continue.

## 2022-10-01 NOTE — Progress Notes (Signed)
Physical Therapy Treatment Patient Details Name: Nicolas Zuniga MRN: [id number] DOB: 1956/08/23 Today's Date: 10/01/2022   History of Present Illness 66 y.o. male  2 Days Post-Op s/p exploratory laparotomy, abdominal washout, and diverting loop ileostomy for intra-abdominal purulent fluid after initial open right colectomy with open cholecystectomy for right colon cancer and cholelithiasis on 10/19    PT Comments    Pt sleepy upon arrival, however easily awakens and remains alert during active stimulation. Pt able to tolerate longer duration session this date. Pt motivated and agreeable to attempt OOB mobility this session. +2 and RW used for assistance. Requires bed to be elevated prior to standing. HR increased to 130s with exertion and O2 sats at 85% on RA, re-donned 3L of O2 with sats improved to 98%. Pt remains motivated to participate as he was completely indep prior to admission. WIfe at bedside and supportive. COntinues to be great CIR candidate. Wife expresses concern for pt not sleeping well. Recommended to keep lights on, blinds open during day and to decrease distractions at night. Also suggested a sleep schedule. Will continue to progress.  Recommendations for follow up therapy are one component of a multi-disciplinary discharge planning process, led by the attending physician.  Recommendations may be updated based on patient status, additional functional criteria and insurance authorization.  Follow Up Recommendations  Acute inpatient rehab (3hours/day)     Assistance Recommended at Discharge Intermittent Supervision/Assistance  Patient can return home with the following Two people to help with walking and/or transfers;Two people to help with bathing/dressing/bathroom;Help with stairs or ramp for entrance   Equipment Recommendations   (TBD)    Recommendations for Other Services       Precautions / Restrictions Precautions Precautions: Fall Restrictions Weight Bearing  Restrictions: No     Mobility  Bed Mobility Overal bed mobility: Needs Assistance Bed Mobility: Supine to Sit, Sit to Supine     Supine to sit: Mod assist     General bed mobility comments: cues for log rolling with pt able to prop L LE and reach across body. Mod assist for B LE management. Once seated, supervision given    Transfers Overall transfer level: Needs assistance Equipment used: Rolling walker (2 wheels) Transfers: Bed to chair/wheelchair/BSC     Step pivot transfers: Mod assist, +2 physical assistance, From elevated surface       General transfer comment: requires bed to be elevated prior to transfer. Once standing, able to SPT over to recliner. Slow movement and increased assist for copious line/lead management. HR increases to 130s with exertion with pt diaphoretic and pale.    Ambulation/Gait               General Gait Details: deferred this session   Stairs             Wheelchair Mobility    Modified Rankin (Stroke Patients Only)       Balance Overall balance assessment: Needs assistance Sitting-balance support: Feet supported, Single extremity supported Sitting balance-Leahy Scale: Fair     Standing balance support: Bilateral upper extremity supported Standing balance-Leahy Scale: Fair                              Cognition Arousal/Alertness: Awake/alert Behavior During Therapy: WFL for tasks assessed/performed Overall Cognitive Status: Within Functional Limits for tasks assessed  General Comments: sleepy, nods off when not actively stimulated. Once seated EOB, alertness improves drasticaly        Exercises Other Exercises Other Exercises: Supine ther-ex performed on B LE including quad sets, SLRs, and hip abd/add. 10 reps with min assist. Educated on frequency and duration. L LE appeared stronger this date    General Comments        Pertinent Vitals/Pain Pain  Assessment Pain Assessment: 0-10 Pain Score: 2  (increases to 5 with exertion) Pain Location: abdomen Pain Descriptors / Indicators: Discomfort, Operative site guarding Pain Intervention(s): Limited activity within patient's tolerance    Home Living                          Prior Function            PT Goals (current goals can now be found in the care plan section) Acute Rehab PT Goals Patient Stated Goal: to get stronger PT Goal Formulation: With patient Time For Goal Achievement: 10/14/22 Potential to Achieve Goals: Good Progress towards PT goals: Progressing toward goals    Frequency    Min 2X/week      PT Plan Current plan remains appropriate    Co-evaluation              AM-PAC PT "6 Clicks" Mobility   Outcome Measure  Help needed turning from your back to your side while in a flat bed without using bedrails?: A Little Help needed moving from lying on your back to sitting on the side of a flat bed without using bedrails?: A Little Help needed moving to and from a bed to a chair (including a wheelchair)?: A Little Help needed standing up from a chair using your arms (e.g., wheelchair or bedside chair)?: A Little Help needed to walk in hospital room?: Total Help needed climbing 3-5 steps with a railing? : Total 6 Click Score: 14    End of Session Equipment Utilized During Treatment: Oxygen Activity Tolerance: Patient tolerated treatment well Patient left: in chair;with family/visitor present Nurse Communication: Mobility status PT Visit Diagnosis: Muscle weakness (generalized) (M62.81);Difficulty in walking, not elsewhere classified (R26.2);Pain Pain - Right/Left:  (abdomen) Pain - part of body:  (abdomen)     Time: 9604-5409 PT Time Calculation (min) (ACUTE ONLY): 38 min  Charges:  $Therapeutic Exercise: 23-37 mins $Therapeutic Activity: 8-22 mins                     Nicolas Zuniga, PT, DPT,  GCS [phone]    Nicolas Zuniga 10/01/2022, 1:11 PM

## 2022-10-01 NOTE — Progress Notes (Signed)
   Inpatient Rehab Admissions Coordinator :  Noted progress with therapy today.At this time patient appears to be a potential candidate for CIR. I will place a rehab consult per protocol for full assessment. Please call me with any questions.  Danne Baxter RN MSN Admissions Coordinator [phone]

## 2022-10-01 NOTE — Progress Notes (Signed)
[facility] Progress Note  Patient: Nicolas Zuniga    OJJ:[id number]  DOA: 09/24/2022     Date of Service: the patient was seen and examined on 10/01/2022  No chief complaint on file.  Brief hospital course: This is a 66 yo male with PMH of Adenocarcinoma of Colon (09/08/22), Cholelithiasis, Chronic Gastritis, Chronic Diastolic Dysfunction (TTE 04/09/22: EF >55%, triv TR, G1DD), Diverticulosis  HTN, Paroxysmal Atrial Fibrillation on Apixaban, Type II Diabetes Mellitus, who presented to [facility] on 10/19 for laparoscopic right colectomy and cholecystectomy.  Surgical findings were right colon mass with evidence of bulky mesenteric nodal involvement; large right colon mass adhered to the retroperitoneum; mesenteric nodes within terminal ileum; tension free anastomosis, no evidence of intraop leak and good perfusion; and chronic calculus cholecystitis.  Postop pt admitted to the medsurg unit per general surgery for additional workup and treatment.  Patient was admitted under general surgery team.   09/24/2022 - Underwent a laparoscopic right colectomy that was converted to open right colectomy along with a cholecystectomy - no significant complications and EBL was minimal at 80 cc.   10/20: Seen early morning by Dr. Dahlia Byes (general surgery) and was doing well.  Starting around 6 PM he began to have hypotension 70/41 and the hospitalist service was consulted. Pt was given total of 25 g of albumin, 2 L of normal saline, and started on glucagon gtt concern for beta blocker toxicity. His metoprolol and flecainide were held. BP improved. Mild AKI on labs Cr 1.93 down form admission 1.23, likely hypovolemia. D/c glucagon. Strict I&O. Continue hold beta blocker and flecainide, may reduce dose metoprolol on discharge.  10/21: BP soft but improved 115/53, 117/64. Cr improved 1.66. Hgb stable.  Rapid response overnight, A-fib RVR.  Transferred to stepdown, amiodarone infusion, minimal response, diltiazem infusion.   Dr. Humphrey Rolls, cardiology, aware. Dr Dahlia Byes ordering CT chest/abdomen/pelvis, starting heparin drip. CT no PE, (+) Small bowel leak, certainly no contrast extravasation and seems contained --> plan repeat CT, hold off on surgical intervention unless emergent, starting abx.  10/23: Patient went for repeat CT noting increased extravasation concerning for anastomotic leak.  Patient taken back to the OR and well no leak found, underwent bowel follow-through and washout.  Postop, patient in ICU and still intubated.  ABG notes severe respiratory acidosis.  Note that should patient remain on ventilator, will have critical care assumed medical management in conjunction with surgery. 10/24: Pt with worsening acute renal failure Nephrology consulted.  Pt with bradycardia overnight cardizem and precedex gtts discontinued.  Pt remains on amiodarone gtt at 30 mg/hr  Pt alert and following commands plans for possible SBT today ~ EXTUBATED 10/25: Tolerating extubation from respiratory standpoint. Off Vasopressors. Slight worsening of AKI, starting to make urine.  DKA resolved, weaning off insulin gtt to SQ insulin.  Atrial fibrillation rate controlled on Amiodarone.   Assessment and Plan: Acute respiratory failure secondary to severe metabolic acidosis  Mechanical intubation intraoperatively for airway protection ~ EXTUBATED 10/24 -Treatment of metabolic disorders as outlined below -Supplemental O2 as needed to maintain O2 sats >92% -Follow intermittent Chest X-ray & ABG as needed -Bronchodilators & Pulmicort nebs -Aggressive Pulmonary toilet as able   Atrial fibrillation with rvr~improving   Hypotension secondary to hypovolemia and septic shock ~ RESOLVED Mildly elevated Troponin due to demand ischemia Hx: HTN and Chronic diastolic dysfunction  ECHOCARDIOGRAM 09/29/22: LVEF 93-71%, normal diastolic function, RV function normal -Continuous cardiac monitoring -Maintain MAP >65 -IV fluids -Vasopressors as needed to  maintain MAP  goal ~ weaned off -Trend lactic acid until normalized (5.7 ~ 4.4. ~ 2.7 ~) -HS Troponin peaked at 109 -Cardiology following, appreciate input -Continue Amiodarone and Heparin infusions -BP improved, consider re-implementing outpatient BB, will defer to Cardiology    Acute kidney injury secondary ATN~worsening  Hyponatremia ~ IMPROVING Anion gap metabolic acidosis ~ RESOLVED Mild Hyponatremia -Monitor I&O's / urinary output -Follow BMP -Ensure adequate renal perfusion -Avoid nephrotoxic agents as able -Replace electrolytes as indicated -Pharmacy following for assistance with electrolyte replacement  -Nephrology following, appreciate input Creatinine 1.86--4.6--4.39   Right colon cancer and cholelithiasis s/p laparoscopic right colectomy and cholecystectomy: 09/24/22 Intra-abdominal infection s/p exploratory laparotomy with abdominal washout and diverting loop ileostomy 10/23 -Monitor fever curve -Trend WBC's & Procalcitonin -Follow cultures as above -Continue empiric Zosyn pending cultures & sensitivities -General Surgery primary service, appreciate input  -NGT LIS 10/26, NG tube was clamped and output is very minimal, general surgery recommended to start clear liquid diet to watch for tolerance and then we will remove the NG tube, Keep NG tube for now    Type II diabetes mellitus  DKA -CBG's q4h; Target range of 140 to 180 Continue Levemir 20 units subcu daily -SSI (DKA resolved, in process of transitioning to SQ insulin) -Follow Hypo/Hyperglycemia protocol -Diabetes coordinator following, appreciate input    Body mass index is 39.32 kg/m.  Nutrition Problem: Increased nutrient needs Etiology: post-op healing Interventions: Interventions: Refer to RD note for recommendations  Pressure Injury 09/30/22 Nare Left Deep Tissue Pressure Injury - Purple or maroon localized area of discolored intact skin or blood-filled blister due to damage of underlying soft  tissue from pressure and/or shear. nonblanchable, red/purple bruise at lef (Active)  09/30/22 1100  Location: Nare  Location Orientation: Left  Staging: Deep Tissue Pressure Injury - Purple or maroon localized area of discolored intact skin or blood-filled blister due to damage of underlying soft tissue from pressure and/or shear.  Wound Description (Comments): nonblanchable, red/purple bruise at left nare  Present on Admission:   Dressing Type Foam - Lift dressing to assess site every shift 10/01/22 1500     Diet: CLD DVT Prophylaxis: Therapeutic Anticoagulation with heparin IV infusion    Advance goals of care discussion: Full code  Family Communication: family was present at bedside, at the time of interview.  The pt provided permission to discuss medical plan with the family. Opportunity was given to ask question and all questions were answered satisfactorily.   Disposition:  Pt is from Home, admitted with colon cancer, s/p surgical resection, developed AKI, A-fib with RVR still has NG tube, started clear liquid diet, which precludes a safe discharge. Discharge to Home with HH/PT vs SNF TBD, when clinically stable.  Subjective: No significant events overnight.  Patient was sitting comfortably in the chair, stated that he feels okay, no any specific complaints other than pain in the abdomen.  Denies any chest pain or palpitations, no worsening of shortness of breath.  Physical Exam: General:  alert oriented to time, place, and person.  Appear in mild distress, affect appropriate Eyes: PERRLA ENT: Oral Mucosa Clear, moist  Neck: no JVD,  Cardiovascular: Irregular rhythm, no Murmur,  Respiratory: good respiratory effort, Bilateral Air entry equal and Decreased, mild Crackles, no wheezes Abdomen: Bowel Sound present, Soft and moderate generalized tenderness,  Skin: no rashes Extremities: mild 1-2+ Pedal edema, no calf tenderness Neurologic: without any new focal findings Gait not  checked due to patient safety concerns  Vitals:   10/01/22 0400 10/01/22  0427 10/01/22 0500 10/01/22 0600  BP: 134/69  133/65 126/65  Pulse: 85  (!) 101 92  Resp: (!) 26  (!) 27 (!) 23  Temp: 98.4 F (36.9 C)     TempSrc: Oral     SpO2: 100%  100% 100%  Weight:  (!) 138.9 kg    Height:        Intake/Output Summary (Last 24 hours) at 10/01/2022 1632 Last data filed at 10/01/2022 1524 Gross per 24 hour  Intake 2332.45 ml  Output 2720 ml  Net -387.55 ml   Filed Weights   09/27/22 0148 09/30/22 0500 10/01/22 0427  Weight: 118 kg (!) 139.3 kg (!) 138.9 kg    Data Reviewed: I have personally reviewed and interpreted daily labs, tele strips, imagings as discussed above. I reviewed all nursing notes, pharmacy notes, vitals, pertinent old records I have discussed plan of care as described above with RN and patient/family.  CBC: Recent Labs  Lab 09/28/22 0459 09/28/22 1450 09/28/22 1807 09/29/22 0106 09/30/22 0413 10/01/22 0136  WBC 10.8* 14.2*  --  13.4* 14.7* 17.7*  NEUTROABS  --  9.3*  --  9.0* 10.6* 13.2*  HGB 9.1* 7.6* 7.3* 7.6* 7.6* 8.1*  HCT 30.1* 26.0* 25.1* 25.4* 24.2* 25.5*  MCV 79.2* 80.2  --  78.9* 74.5* 74.6*  PLT 303 259  --  254 199 242   Basic Metabolic Panel: Recent Labs  Lab 09/27/22 0332 09/28/22 0459 09/28/22 1449 09/28/22 1450 09/28/22 1807 09/29/22 0106 09/29/22 1041 09/29/22 1140 09/29/22 1509 09/29/22 2042 09/30/22 0413 10/01/22 0136  NA 133* 128*   < >  --    < > 130*   < > 126* 130* 128* 131* 133*  K 4.3 4.0   < >  --    < > 4.1   < > 3.3* 3.8 3.6 3.4* 3.9  CL 103 96*   < >  --    < > 94*   < > 93* 98 97* 99 101  CO2 18* 16*   < >  --    < > 16*   < > 17* 19* 21* 22 22  GLUCOSE 84 204*   < >  --    < > 324*   < > 190* 153* 191* 150* 165*  BUN 22 25*   < >  --    < > 29*   < > 36* 40* 44* 46* 52*  CREATININE 1.45* 1.86*   < >  --    < > 2.97*   < > 3.59* 4.12* 4.33* 4.61* 4.39*  CALCIUM 8.9 8.4*   < >  --    < > 7.5*   < > 7.8* 8.7*  8.3* 8.4* 8.4*  MG 1.5* 1.9  --   --   --  1.8  --   --   --   --  2.6* 2.6*  PHOS  --   --   --  4.4  --  3.1  --   --   --   --  1.6* 2.1*   < > = values in this interval not displayed.    Studies: No results found.  Scheduled Meds:  Chlorhexidine Gluconate Cloth  6 each Topical Daily   insulin aspart  0-20 Units Subcutaneous Q4H   insulin detemir  20 Units Subcutaneous Q24H   pantoprazole (PROTONIX) IV  40 mg Intravenous QHS   sodium chloride flush  10-40 mL Intracatheter Q12H   Continuous Infusions:  amiodarone 30 mg/hr (10/01/22 0627)   heparin 2,400 Units/hr (10/01/22 1504)   piperacillin-tazobactam (ZOSYN)  IV 3.375 g (10/01/22 0853)   TPN ADULT (ION) 60 mL/hr at 10/01/22 0600   TPN ADULT (ION)     PRN Meds: dextrose, morphine injection, ondansetron **OR** ondansetron (ZOFRAN) IV, mouth rinse, oxyCODONE, prochlorperazine **OR** prochlorperazine, sodium chloride flush  Time spent: 35 minutes  Author: Val Riles. MD [facility] 10/01/2022 4:32 PM  To reach On-call, see care teams to locate the attending and reach out to them via [email or web address]. If 7PM-7AM, please contact night-coverage If you still have difficulty reaching the attending provider, please page the [facility] (Director on Call) for [facility] on amion for assistance.

## 2022-10-01 NOTE — Plan of Care (Addendum)
Clamping NG per orders (for 4hrs) and will assess output at that time.  Current output 525cc in canister.  1530  NG tube unclamped and returned to suction.  50cc added to canister. Dr. Informed.  Gen Surgery Dr. Hanley Seamen permission to attempt CL with NG still in place.  Patient tolerated a full cup of water and some applejuice.  [place]  Dr. Informed of progress and gave verbal to pull NG Tube.

## 2022-10-01 NOTE — Consult Note (Signed)
ANTICOAGULATION CONSULT NOTE  Pharmacy Consult for Heparin Infusion Indication: atrial fibrillation  Patient Measurements: Height: '6\' 2"'$  (188 cm) Weight: (!) 138.9 kg (306 lb 3.5 oz) IBW/kg (Calculated) : 82.2 Heparin Dosing Weight: 107.3 kg  Labs: Recent Labs     0000 09/28/22 2232 09/29/22 0106 09/29/22 1041 09/29/22 2042 09/30/22 0413 09/30/22 1358 10/01/22 0136 10/01/22 1244 10/01/22 2157  HGB   < >  --  7.6*  --   --  7.6*  --  8.1*  --   --   HCT  --   --  25.4*  --   --  24.2*  --  25.5*  --   --   PLT  --   --  254  --   --  199  --  211  --   --   HEPARINUNFRC  --   --   --   --   --  <0.10*   < > <0.10* 0.10* 0.18*  CREATININE  --   --  2.97*   < > 4.33* 4.61*  --  4.39*  --   --   TROPONINIHS  --  109* 70*  --   --   --   --   --   --   --    < > = values in this interval not displayed.     Estimated Creatinine Clearance: 24.6 mL/min (A) (by C-G formula based on SCr of 4.39 mg/dL (H)).   Medical History: Past Medical History:  Diagnosis Date   Adenocarcinoma of colon ([place]) 09/08/2022   Cholelithiasis    Chronic gastritis    Colon polyps    Coronary artery calcification seen on CT scan    Diastolic dysfunction    a.) TTE 04/09/2022: EF >55%, triv TR, G1DD   Diverticulosis    Hypertension    Long term current use of anticoagulant    a.) apixaban   Nephrolithiasis    Paroxysmal A-fib ([place])    a.) CHA2DS2VASc = 3 (age, HTN, T2DM);  b.) s/p DCCV 07/09/2016 (100 J x1), 12/22/2018 (120 J x1), 08/18/2021 (120 J x1), 03/11/2022 (120 J x1), 03/31/2022 (120 J x1); c.) rate/rhythm maintained on oral flecanide + metoprolol succinate; chronically anticoagulated with apixaban   T2DM (type 2 diabetes mellitus) ([place])    Assessment: Nicolas Zuniga is a 66 y.o. male presenting for laparoscopic R colectomy and cholecystectomy. PMH significant for recently diagnosed adenocarcinoma of the colon, DM, HTN, Afib on metoprolol, flecainide, and apixaban. Patient was on [facility] PTA  per chart review, last dose of apixaban PTA reported to be on 10/16. Last enoxaparin dose 10/20 AM. Pharmacy has been consulted to initiate and manage heparin infusion.   Baseline Labs: aPTT 44, PT 22.1, INR 2.0, Hgb 9.3, Hct 31.1, Plt 208   Patient previously on heparin this admission. Noted patient required multiple titrations due to subtherapeutic levels, last rate with levels was 1950 units/hr with HL 0.13. Anticoagulation was subsequently discontinued for ExLap.  Goal of Therapy:  Heparin level 0.3 - 0.5 units/ml (no bolus protocol) Monitor platelets by anticoagulation protocol: Yes  1026 2157 HL 0.18, subthera; 2400 un/hr   Plan:  --Increase heparin rate to 2600 units/hr --Will check heparin level 6 hours after rate change --Daily CBC per protocol while on IV heparin  Thank you for allowing pharmacy to be a part of this patient's care.  Benita Gutter 10/01/2022 10:22 PM

## 2022-10-01 NOTE — Consult Note (Addendum)
ANTICOAGULATION CONSULT NOTE  Pharmacy Consult for Heparin Infusion Indication: atrial fibrillation  Patient Measurements: Height: '6\' 2"'$  (188 cm) Weight: (!) 138.9 kg (306 lb 3.5 oz) IBW/kg (Calculated) : 82.2 Heparin Dosing Weight: 107.3 kg  Labs: Recent Labs    09/28/22 1945 09/28/22 2232 09/29/22 0106 09/29/22 0106 09/29/22 1041 09/29/22 2042 09/30/22 0413 09/30/22 1358 10/01/22 0136 10/01/22 1244  HGB  --   --  7.6*  --   --   --  7.6*  --  8.1*  --   HCT  --   --  25.4*  --   --   --  24.2*  --  25.5*  --   PLT  --   --  254  --   --   --  199  --  211  --   HEPARINUNFRC  --   --   --    < >  --   --  <0.10* <0.10* <0.10* 0.10*  CREATININE  --   --  2.97*  --    < > 4.33* 4.61*  --  4.39*  --   TROPONINIHS 80* 109* 70*  --   --   --   --   --   --   --    < > = values in this interval not displayed.     Estimated Creatinine Clearance: 24.6 mL/min (A) (by C-G formula based on SCr of 4.39 mg/dL (H)).   Medical History: Past Medical History:  Diagnosis Date   Adenocarcinoma of colon ([place]) 09/08/2022   Cholelithiasis    Chronic gastritis    Colon polyps    Coronary artery calcification seen on CT scan    Diastolic dysfunction    a.) TTE 04/09/2022: EF >55%, triv TR, G1DD   Diverticulosis    Hypertension    Long term current use of anticoagulant    a.) apixaban   Nephrolithiasis    Paroxysmal A-fib ([place])    a.) CHA2DS2VASc = 3 (age, HTN, T2DM);  b.) s/p DCCV 07/09/2016 (100 J x1), 12/22/2018 (120 J x1), 08/18/2021 (120 J x1), 03/11/2022 (120 J x1), 03/31/2022 (120 J x1); c.) rate/rhythm maintained on oral flecanide + metoprolol succinate; chronically anticoagulated with apixaban   T2DM (type 2 diabetes mellitus) ([place])    Assessment: Nicolas Zuniga is a 66 y.o. male presenting for laparoscopic R colectomy and cholecystectomy. PMH significant for recently diagnosed adenocarcinoma of the colon, DM, HTN, Afib on metoprolol, flecainide, and apixaban. Patient [facility] on  [facility] per chart review, last dose of apixaban PTA reported to be on 10/16. Last enoxaparin dose 10/20 AM. Pharmacy has been consulted to initiate and manage heparin infusion.   Baseline Labs: aPTT 44, PT 22.1, INR 2.0, Hgb 9.3, Hct 31.1, Plt 208   Patient previously on heparin this admission. Noted patient required multiple titrations due to subtherapeutic levels, last rate with levels was 1950 units/hr with HL 0.13. Anticoagulation was subsequently discontinued for ExLap.  Goal of Therapy:  Heparin level 0.3 - 0.5 units/ml (no bolus protocol) Monitor platelets by anticoagulation protocol: Yes     Plan:  --10/26'@1244'$  HL 0.10, subtherapeutic --Increase heparin rate to 2400 units/hr --Will check heparin level 6 hours after rate change --Daily CBC per protocol while on IV heparin; note worsening anemia & thrombocytopenia  Thank you for allowing pharmacy to be a part of this patient's care.  Claiborne Billings Bobby Ragan 10/01/2022 2:11 PM

## 2022-10-02 DIAGNOSIS — Z9049 Acquired absence of other specified parts of digestive tract: Secondary | ICD-10-CM | POA: Diagnosis not present

## 2022-10-02 LAB — HEPARIN LEVEL (UNFRACTIONATED)
Heparin Unfractionated: 0.22 IU/mL — ABNORMAL LOW (ref 0.30–0.70)
Heparin Unfractionated: 0.27 IU/mL — ABNORMAL LOW (ref 0.30–0.70)
Heparin Unfractionated: 0.3 IU/mL (ref 0.30–0.70)

## 2022-10-02 LAB — CBC WITH DIFFERENTIAL/PLATELET
Abs Immature Granulocytes: 1.62 10*3/uL — ABNORMAL HIGH (ref 0.00–0.07)
Basophils Absolute: 0.2 10*3/uL — ABNORMAL HIGH (ref 0.0–0.1)
Basophils Relative: 1 %
Eosinophils Absolute: 0.2 10*3/uL (ref 0.0–0.5)
Eosinophils Relative: 1 %
HCT: 24.4 % — ABNORMAL LOW (ref 39.0–52.0)
Hemoglobin: 7.7 g/dL — ABNORMAL LOW (ref 13.0–17.0)
Immature Granulocytes: 6 %
Lymphocytes Relative: 7 %
Lymphs Abs: 1.9 10*3/uL (ref 0.7–4.0)
MCH: 23.3 pg — ABNORMAL LOW (ref 26.0–34.0)
MCHC: 31.6 g/dL (ref 30.0–36.0)
MCV: 73.9 fL — ABNORMAL LOW (ref 80.0–100.0)
Monocytes Absolute: 2.8 10*3/uL — ABNORMAL HIGH (ref 0.1–1.0)
Monocytes Relative: 10 %
Neutro Abs: 22.8 10*3/uL — ABNORMAL HIGH (ref 1.7–7.7)
Neutrophils Relative %: 75 %
Platelets: 261 10*3/uL (ref 150–400)
RBC: 3.3 MIL/uL — ABNORMAL LOW (ref 4.22–5.81)
RDW: 15.5 % (ref 11.5–15.5)
Smear Review: NORMAL
WBC: 29.5 10*3/uL — ABNORMAL HIGH (ref 4.0–10.5)
nRBC: 0.4 % — ABNORMAL HIGH (ref 0.0–0.2)

## 2022-10-02 LAB — GLUCOSE, CAPILLARY
Glucose-Capillary: 247 mg/dL — ABNORMAL HIGH (ref 70–99)
Glucose-Capillary: 298 mg/dL — ABNORMAL HIGH (ref 70–99)
Glucose-Capillary: 272 mg/dL — ABNORMAL HIGH (ref 70–99)
Glucose-Capillary: 286 mg/dL — ABNORMAL HIGH (ref 70–99)
Glucose-Capillary: 243 mg/dL — ABNORMAL HIGH (ref 70–99)

## 2022-10-02 LAB — BASIC METABOLIC PANEL
Anion gap: 11 (ref 5–15)
BUN: 48 mg/dL — ABNORMAL HIGH (ref 8–23)
CO2: 20 mmol/L — ABNORMAL LOW (ref 22–32)
Calcium: 8.3 mg/dL — ABNORMAL LOW (ref 8.9–10.3)
Chloride: 102 mmol/L (ref 98–111)
Creatinine, Ser: 3.07 mg/dL — ABNORMAL HIGH (ref 0.61–1.24)
GFR, Estimated: 22 mL/min — ABNORMAL LOW (ref >60)
Glucose, Bld: 284 mg/dL — ABNORMAL HIGH (ref 70–99)
Potassium: 3.8 mmol/L (ref 3.5–5.1)
Sodium: 133 mmol/L — ABNORMAL LOW (ref 135–145)

## 2022-10-02 LAB — PHOSPHORUS: Phosphorus: 2.4 mg/dL — ABNORMAL LOW (ref 2.5–4.6)

## 2022-10-02 LAB — MAGNESIUM: Magnesium: 2.4 mg/dL (ref 1.7–2.4)

## 2022-10-02 MED ORDER — TRACE MINERALS CU-MN-SE-ZN 300-55-60-3000 MCG/ML IV SOLN
INTRAVENOUS | Status: AC
  Filled 2022-10-02: qty 964.8

## 2022-10-02 MED ORDER — INSULIN DETEMIR 100 UNIT/ML ~~LOC~~ SOLN
10.0000 [IU] | Freq: Once | SUBCUTANEOUS | Status: AC
  Administered 2022-10-02: 10 [IU] via SUBCUTANEOUS
  Filled 2022-10-02: qty 0.1

## 2022-10-02 MED ORDER — AMIODARONE HCL 200 MG PO TABS
200.0000 mg | ORAL_TABLET | Freq: Every day | ORAL | Status: DC
  Administered 2022-10-04 – 2022-10-05 (×2): 200 mg via ORAL
  Filled 2022-10-02 (×2): qty 1

## 2022-10-02 MED ORDER — AMIODARONE HCL 200 MG PO TABS
200.0000 mg | ORAL_TABLET | Freq: Two times a day (BID) | ORAL | Status: AC
  Administered 2022-10-02 – 2022-10-03 (×4): 200 mg via ORAL
  Filled 2022-10-02 (×4): qty 1

## 2022-10-02 MED ORDER — INSULIN DETEMIR 100 UNIT/ML ~~LOC~~ SOLN
5.0000 [IU] | Freq: Once | SUBCUTANEOUS | Status: DC
  Filled 2022-10-02: qty 0.05

## 2022-10-02 MED ORDER — INSULIN ASPART 100 UNIT/ML IJ SOLN
3.0000 [IU] | INTRAMUSCULAR | Status: DC
  Administered 2022-10-02 – 2022-10-04 (×12): 3 [IU] via SUBCUTANEOUS
  Filled 2022-10-02 (×10): qty 1

## 2022-10-02 MED ORDER — FUROSEMIDE 10 MG/ML IJ SOLN
20.0000 mg | Freq: Once | INTRAMUSCULAR | Status: AC
  Administered 2022-10-02: 20 mg via INTRAVENOUS
  Filled 2022-10-02: qty 2

## 2022-10-02 MED ORDER — METOPROLOL TARTRATE 25 MG PO TABS
25.0000 mg | ORAL_TABLET | Freq: Two times a day (BID) | ORAL | Status: DC
  Administered 2022-10-02 – 2022-10-05 (×7): 25 mg via ORAL
  Filled 2022-10-02 (×7): qty 1

## 2022-10-02 MED ORDER — INSULIN DETEMIR 100 UNIT/ML ~~LOC~~ SOLN
15.0000 [IU] | Freq: Two times a day (BID) | SUBCUTANEOUS | Status: DC
  Administered 2022-10-03 – 2022-10-07 (×10): 15 [IU] via SUBCUTANEOUS
  Filled 2022-10-02 (×11): qty 0.15

## 2022-10-02 NOTE — Progress Notes (Addendum)
Inpatient Rehab Admissions Coordinator:   Addendum: Spoke with wife via phone, she is interested in pursuing CIR. Will check back on Monday to see how patient is doing with therapy and medically for potential admit to CIR early next week. Wife in agreement.   Following for potential CIR admission. Called patient and wife, left message. Patient is not medically stable at this time. Will continue to watch progress for potential admission next week if patient/family in agreement.   Rehab Admissons Coordinator [place], [place], [place] [phone]

## 2022-10-02 NOTE — Progress Notes (Signed)
[facility] Progress Note  Patient: Nicolas Zuniga    PYK:[id number]  DOA: 09/24/2022     Date of Service: the patient was seen and examined on 10/02/2022  No chief complaint on file.  Brief hospital course: This is a 66 yo male with PMH of Adenocarcinoma of Colon (09/08/22), Cholelithiasis, Chronic Gastritis, Chronic Diastolic Dysfunction (TTE 04/09/22: EF >55%, triv TR, G1DD), Diverticulosis  HTN, Paroxysmal Atrial Fibrillation on Apixaban, Type II Diabetes Mellitus, who presented to [facility] on 10/19 for laparoscopic right colectomy and cholecystectomy.  Surgical findings were right colon mass with evidence of bulky mesenteric nodal involvement; large right colon mass adhered to the retroperitoneum; mesenteric nodes within terminal ileum; tension free anastomosis, no evidence of intraop leak and good perfusion; and chronic calculus cholecystitis.  Postop pt admitted to the medsurg unit per general surgery for additional workup and treatment.  Patient was admitted under general surgery team.   09/24/2022 - Underwent a laparoscopic right colectomy that was converted to open right colectomy along with a cholecystectomy - no significant complications and EBL was minimal at 80 cc.   10/20: Seen early morning by Dr. Dahlia Byes (general surgery) and was doing well.  Starting around 6 PM he began to have hypotension 70/41 and the hospitalist service was consulted. Pt was given total of 25 g of albumin, 2 L of normal saline, and started on glucagon gtt concern for beta blocker toxicity. His metoprolol and flecainide were held. BP improved. Mild AKI on labs Cr 1.93 down form admission 1.23, likely hypovolemia. D/c glucagon. Strict I&O. Continue hold beta blocker and flecainide, may reduce dose metoprolol on discharge.  10/21: BP soft but improved 115/53, 117/64. Cr improved 1.66. Hgb stable.  Rapid response overnight, A-fib RVR.  Transferred to stepdown, amiodarone infusion, minimal response, diltiazem infusion.   Dr. Humphrey Rolls, cardiology, aware. Dr Dahlia Byes ordering CT chest/abdomen/pelvis, starting heparin drip. CT no PE, (+) Small bowel leak, certainly no contrast extravasation and seems contained --> plan repeat CT, hold off on surgical intervention unless emergent, starting abx.  10/23: Patient went for repeat CT noting increased extravasation concerning for anastomotic leak.  Patient taken back to the OR and well no leak found, underwent bowel follow-through and washout.  Postop, patient in ICU and still intubated.  ABG notes severe respiratory acidosis.  Note that should patient remain on ventilator, will have critical care assumed medical management in conjunction with surgery. 10/24: Pt with worsening acute renal failure Nephrology consulted.  Pt with bradycardia overnight cardizem and precedex gtts discontinued.  Pt remains on amiodarone gtt at 30 mg/hr  Pt alert and following commands plans for possible SBT today ~ EXTUBATED 10/25: Tolerating extubation from respiratory standpoint. Off Vasopressors. Slight worsening of AKI, starting to make urine.  DKA resolved, weaning off insulin gtt to SQ insulin.  Atrial fibrillation rate controlled on Amiodarone.   Assessment and Plan: Acute respiratory failure secondary to severe metabolic acidosis  Mechanical intubation intraoperatively for airway protection ~ EXTUBATED 10/24 -Treatment of metabolic disorders as outlined below -Supplemental O2 as needed to maintain O2 sats >92% -Follow intermittent Chest X-ray & ABG as needed -Bronchodilators & Pulmicort nebs -Aggressive Pulmonary toilet as able   Atrial fibrillation with rvr~improving   Hypotension secondary to hypovolemia and septic shock ~ RESOLVED Mildly elevated Troponin due to demand ischemia Hx: HTN and Chronic diastolic dysfunction  ECHOCARDIOGRAM 09/29/22: LVEF 53-97%, normal diastolic function, RV function normal -Continuous cardiac monitoring -Maintain MAP >65 -IV fluids -Vasopressors as needed to  maintain MAP  goal ~ weaned off -Trend lactic acid until normalized (5.7 ~ 4.4. ~ 2.7 ~) -HS Troponin peaked at 109 -Cardiology following, appreciate input -Continue Amiodarone and Heparin infusions -BP improved, consider re-implementing outpatient BB, will defer to Cardiology    Acute kidney injury secondary ATN~worsening  Hyponatremia ~ IMPROVING Anion gap metabolic acidosis ~ RESOLVED Mild Hyponatremia -Monitor I&O's / urinary output -Follow BMP -Ensure adequate renal perfusion -Avoid nephrotoxic agents as able -Replace electrolytes as indicated -Pharmacy following for assistance with electrolyte replacement  -Nephrology following, appreciate input Creatinine 1.86--4.6--4.39--3.7 gradually improving   Right colon cancer and cholelithiasis s/p laparoscopic right colectomy and cholecystectomy: 09/24/22 Intra-abdominal infection s/p exploratory laparotomy with abdominal washout and diverting loop ileostomy 10/23 -Monitor fever curve -Trend WBC's & Procalcitonin -Follow cultures as above -Continue empiric Zosyn pending cultures & sensitivities -General Surgery primary service, appreciate input  -s/p NG LIS 10/26, NG tube was clamped and output is very minimal, general surgery recommended to start clear liquid diet and NG tube was removed.       Type II diabetes mellitus  DKA -CBG's q4h; Target range of 140 to 180 Continue Levemir 20 units subcu daily -SSI (DKA resolved, in process of transitioning to SQ insulin) -Follow Hypo/Hyperglycemia protocol -Diabetes coordinator following, appreciate input    Body mass index is 39.32 kg/m.  Nutrition Problem: Increased nutrient needs Etiology: post-op healing Interventions: Interventions: Refer to RD note for recommendations  Pressure Injury 09/30/22 Nare Left Deep Tissue Pressure Injury - Purple or maroon localized area of discolored intact skin or blood-filled blister due to damage of underlying soft tissue from pressure  and/or shear. nonblanchable, red/purple bruise at lef (Active)  09/30/22 1100  Location: Nare  Location Orientation: Left  Staging: Deep Tissue Pressure Injury - Purple or maroon localized area of discolored intact skin or blood-filled blister due to damage of underlying soft tissue from pressure and/or shear.  Wound Description (Comments): nonblanchable, red/purple bruise at left nare  Present on Admission:   Dressing Type None 10/02/22 0800     Diet: CLD DVT Prophylaxis: Therapeutic Anticoagulation with heparin IV infusion    Advance goals of care discussion: Full code  Family Communication: family was present at bedside, at the time of interview.  The pt provided permission to discuss medical plan with the family. Opportunity was given to ask question and all questions were answered satisfactorily.   Disposition:  Pt is from Home, admitted with colon cancer, s/p surgical resection, developed AKI, A-fib with RVR still has NG tube, started clear liquid diet, which precludes a safe discharge. Discharge to Home with HH/PT vs SNF TBD, when clinically stable.  Subjective: No significant events overnight.  Patient was lying comfortably in the bed, stated that he is feeling okay, tolerated clear liquid diet well.  Patient did have 1 BM through the rectum could be a residual mucus/stool.  Patient has colostomy bag which is working fine.  Denied any nausea vomiting. Denies any worsening of shortness of breath, no chest pain or palpitations.   Physical Exam: General:  alert oriented to time, place, and person.  Appear in mild distress, affect appropriate Eyes: PERRLA ENT: Oral Mucosa Clear, moist  Neck: no JVD,  Cardiovascular: Irregular rhythm, no Murmur,  Respiratory: good respiratory effort, Bilateral Air entry equal and Decreased, mild Crackles, no wheezes Abdomen: Bowel Sound present, Soft and moderate generalized tenderness,  Skin: no rashes Extremities: mild 1-2+ Pedal edema, no  calf tenderness Neurologic: without any new focal findings Gait not checked due to  patient safety concerns  Vitals:   10/02/22 1100 10/02/22 1200 10/02/22 1300 10/02/22 1400  BP: (!) 144/72 (!) 144/76 (!) 146/70 126/74  Pulse: 96 96 (!) 104 89  Resp: (!) 27 (!) 26 (!) 24 (!) 25  Temp:      TempSrc:      SpO2: 96% 99% 96% 97%  Weight:      Height:        Intake/Output Summary (Last 24 hours) at 10/02/2022 1521 Last data filed at 10/02/2022 1400 Gross per 24 hour  Intake 3721.82 ml  Output 4730 ml  Net -1008.18 ml   Filed Weights   09/27/22 0148 09/30/22 0500 10/01/22 0427  Weight: 118 kg (!) 139.3 kg (!) 138.9 kg    Data Reviewed: I have personally reviewed and interpreted daily labs, tele strips, imagings as discussed above. I reviewed all nursing notes, pharmacy notes, vitals, pertinent old records I have discussed plan of care as described above with RN and patient/family.  CBC: Recent Labs  Lab 09/28/22 1450 09/28/22 1807 09/29/22 0106 09/30/22 0413 10/01/22 0136 10/02/22 0524  WBC 14.2*  --  13.4* 14.7* 17.7* 29.5*  NEUTROABS 9.3*  --  9.0* 10.6* 13.2* 22.8*  HGB 7.6* 7.3* 7.6* 7.6* 8.1* 7.7*  HCT 26.0* 25.1* 25.4* 24.2* 25.5* 24.4*  MCV 80.2  --  78.9* 74.5* 74.6* 73.9*  PLT 259  --  254 199 211 856   Basic Metabolic Panel: Recent Labs  Lab 09/28/22 0459 09/28/22 1449 09/28/22 1450 09/28/22 1807 09/29/22 0106 09/29/22 1041 09/29/22 1509 09/29/22 2042 09/30/22 0413 10/01/22 0136 10/02/22 0524  NA 128*   < >  --    < > 130*   < > 130* 128* 131* 133* 133*  K 4.0   < >  --    < > 4.1   < > 3.8 3.6 3.4* 3.9 3.8  CL 96*   < >  --    < > 94*   < > 98 97* 99 101 102  CO2 16*   < >  --    < > 16*   < > 19* 21* 22 22 20*  GLUCOSE 204*   < >  --    < > 324*   < > 153* 191* 150* 165* 284*  BUN 25*   < >  --    < > 29*   < > 40* 44* 46* 52* 48*  CREATININE 1.86*   < >  --    < > 2.97*   < > 4.12* 4.33* 4.61* 4.39* 3.07*  CALCIUM 8.4*   < >  --    < > 7.5*    < > 8.7* 8.3* 8.4* 8.4* 8.3*  MG 1.9  --   --   --  1.8  --   --   --  2.6* 2.6* 2.4  PHOS  --   --  4.4  --  3.1  --   --   --  1.6* 2.1* 2.4*   < > = values in this interval not displayed.    Studies: No results found.  Scheduled Meds:  amiodarone  200 mg Oral BID   Followed by   Derrill Memo ON 10/04/2022] amiodarone  200 mg Oral Daily   Chlorhexidine Gluconate Cloth  6 each Topical Daily   insulin aspart  0-20 Units Subcutaneous Q4H   insulin aspart  3 Units Subcutaneous Q4H   insulin detemir  10 Units Subcutaneous Once   [START  ON 10/03/2022] insulin detemir  15 Units Subcutaneous BID   metoprolol tartrate  25 mg Oral BID   pantoprazole (PROTONIX) IV  40 mg Intravenous QHS   sodium chloride flush  10-40 mL Intracatheter Q12H   Continuous Infusions:  heparin 2,700 Units/hr (10/02/22 1400)   piperacillin-tazobactam (ZOSYN)  IV 12.5 mL/hr at 10/02/22 1400   TPN ADULT (ION) 90 mL/hr at 10/02/22 1400   TPN ADULT (ION)     PRN Meds: dextrose, morphine injection, ondansetron **OR** ondansetron (ZOFRAN) IV, mouth rinse, oxyCODONE, prochlorperazine **OR** prochlorperazine, sodium chloride flush  Time spent: 35 minutes  Author: Val Riles. MD [facility] 10/02/2022 3:21 PM  To reach On-call, see care teams to locate the attending and reach out to them via [email or web address]. If 7PM-7AM, please contact night-coverage If you still have difficulty reaching the attending provider, please page the [facility] (Director on Call) for [facility] on amion for assistance.

## 2022-10-02 NOTE — Progress Notes (Signed)
[place] NOTE       Patient ID: Nicolas Zuniga MRN: [id number] DOB/AGE: [birth year] 66 y.o.  Admit date: 09/24/2022 Referring Physician Sharion Settler, NP  Primary Physician Dr. Hortencia Pilar ([facility])  Primary Cardiologist Nehemiah Massed Reason for Consultation Af RVR  HPI: Nicolas Zuniga is a 66DPO with a PMH of paroxysmal atrial fibrillation (on flecainide, metoprolol, and Eliquis), HFpEF (EF >55, G1 DD 04/2022), type II diabetes, hypertension, recently diagnosed adenocarcinoma of the colon who presented to [facility]/24[facility]2022 for and elective laparoscopic right colectomy ultimately converted to open right colectomy and cholecystectomy.  Postoperative course was complicated by hypotension and bradycardia the evening of postop day 1, concerning for beta-blocker toxicity for which he was started on glucagon infusion.  Rapid response was called overnight on 10/21 for atrial fibrillation with RVR and questionable VT, was started on amiodarone and diltiazem infusions with some heart rate response.  On 10/23 he underwent exploratory laparotomy, abdominal washout, and diverting loop ileostomy for intra-abdominal purulent fluid seen on CT abdomen pelvis.  Postoperatively, he remained intubated and requiring vasopressor support.  He converted to atrial flutter with variable conduction the evening of 10/23.  Interval history: -doesn't feel as good today, admits to shortness of breath and feels "swollen." Belly pain is about a 7/10  -denies chest pain, palpitations.  -getting clear liquid diet, p.o. medications okay by surgery today -remain in AF, rate acceptable in the 90s-low 100s   Past Medical History:  Diagnosis Date   Adenocarcinoma of colon ([place]) 09/08/2022   Cholelithiasis    Chronic gastritis    Colon polyps    Coronary artery calcification seen on CT scan    Diastolic dysfunction    a.) TTE 04/09/2022: EF >55%, triv TR, G1DD   Diverticulosis    Hypertension     Long term current use of anticoagulant    a.) apixaban   Nephrolithiasis    Paroxysmal A-fib ([place])    a.) CHA2DS2VASc = 3 (age, HTN, T2DM);  b.) s/p DCCV 07/09/2016 (100 J x1), 12/22/2018 (120 J x1), 08/18/2021 (120 J x1), 03/11/2022 (120 J x1), 03/31/2022 (120 J x1); c.) rate/rhythm maintained on oral flecanide + metoprolol succinate; chronically anticoagulated with apixaban   T2DM (type 2 diabetes mellitus) ([place])     Past Surgical History:  Procedure Laterality Date   CARDIOVERSION N/A 12/22/2018   Procedure: CARDIOVERSION (CATH LAB);  Surgeon: Corey Skains, MD;  Location: [facility] ORS;  Service: Cardiovascular;  Laterality: N/A;   CARDIOVERSION N/A 08/18/2021   Procedure: CARDIOVERSION;  Surgeon: Corey Skains, MD;  Location: [facility] ORS;  Service: Cardiovascular;  Laterality: N/A;   CARDIOVERSION N/A 03/11/2022   Procedure: CARDIOVERSION;  Surgeon: Corey Skains, MD;  Location: [facility] ORS;  Service: Cardiovascular;  Laterality: N/A;   CARDIOVERSION N/A 03/31/2022   Procedure: CARDIOVERSION;  Surgeon: Corey Skains, MD;  Location: [facility] ORS;  Service: Cardiovascular;  Laterality: N/A;   CHOLECYSTECTOMY N/A 09/24/2022   Procedure: LAPAROSCOPIC CHOLECYSTECTOMY;  Surgeon: Jules Husbands, MD;  Location: [facility] ORS;  Service: General;  Laterality: N/A;   COLONOSCOPY WITH PROPOFOL N/A 09/08/2022   Procedure: COLONOSCOPY WITH PROPOFOL;  Surgeon: Lucilla Lame, MD;  Location: [facility] ENDOSCOPY;  Service: Endoscopy;  Laterality: N/A;   ELECTROPHYSIOLOGIC STUDY N/A 07/09/2016   Procedure: CARDIOVERSION;  Surgeon: Dionisio David, MD;  Location: [facility] ORS;  Service: Cardiovascular;  Laterality: N/A;   LAPAROSCOPIC RIGHT COLECTOMY N/A 09/24/2022   Procedure: LAPAROSCOPIC RIGHT COLECTOMY, RNFA to assist;  Surgeon: Jules Husbands, MD;  Location: [facility] ORS;  Service: General;  Laterality: N/A;   LAPAROTOMY N/A 09/28/2022   Procedure: EXPLORATORY LAPAROTOMY WITH OSTOMY CREATION;  Surgeon: Jules Husbands, MD;  Location: [facility] ORS;  Service: General;  Laterality: N/A;   TEE WITHOUT CARDIOVERSION N/A 07/09/2016   Procedure: TRANSESOPHAGEAL ECHOCARDIOGRAM (TEE);  Surgeon: Dionisio David, MD;  Location: [facility] ORS;  Service: Cardiovascular;  Laterality: N/A;   web fingers repaired     as a child    Medications Prior to Admission  Medication Sig Dispense Refill Last Dose   bisacodyl (DULCOLAX) 5 MG EC tablet Take all 4 tablets at 8 am the morning prior to your surgery. 4 tablet 0 09/23/2022   Cholecalciferol (VITAMIN D) 50 MCG (2000 UT) CAPS Take 2,000 Units by mouth daily.   Past Week   flecainide (TAMBOCOR) 100 MG tablet Take 100 mg by mouth 2 (two) times daily.   09/24/2022   glipiZIDE (GLUCOTROL XL) 10 MG 24 hr tablet Take 10 mg by mouth 2 (two) times daily.    09/24/2022   metFORMIN (GLUCOPHAGE-XR) 500 MG 24 hr tablet Take 500 mg by mouth daily with breakfast.   Past Month   metoprolol (LOPRESSOR) 100 MG tablet Take 100 mg by mouth 2 (two) times daily.   09/24/2022   metroNIDAZOLE (FLAGYL) 500 MG tablet Take 2 tablets at 8AM, take 2 tablets at [facility], and take 2 tablets at 8PM the day prior to your surgery 6 tablet 0 09/23/2022   Multiple Vitamins-Minerals (MULTIVITAMIN WITH MINERALS) tablet Take 1 tablet by mouth daily with breakfast.    Past Week   neomycin (MYCIFRADIN) 500 MG tablet Take 2 tablet at 8am, take 2 tablets at 2pm, and take 2 tablets at 8pm the day prior to your surgery 6 tablet 0 09/23/2022   Omega-3 Fatty Acids (FISH OIL) 1000 MG CAPS Take 1,000 mg by mouth daily.   Past Week   OZEMPIC, 1 MG/DOSE, 4 MG/3ML SOPN Inject 1 mg into the skin every Wednesday. Patient stopped taking these medications   Past Month   polyethylene glycol powder (MIRALAX) 17 GM/SCOOP powder Mix full container in 64 ounces of Gatorade or other clear liquid. NO Red 238 g 0 09/23/2022   Turmeric 400 MG CAPS Take 400 mg by mouth daily.   Past Week   vitamin B-12 (CYANOCOBALAMIN) 100 MCG tablet Take 500 mcg by  mouth daily.   Past Week   Vitamin E 400 units TABS Take 400 Units by mouth daily.   Past Week   apixaban (ELIQUIS) 5 MG TABS tablet Take 5 mg by mouth 2 (two) times daily.   09/21/2022   Coenzyme Q10 (COQ10) 100 MG CAPS Take 100 mg by mouth daily.   09/21/2022   Social History   Socioeconomic History   Marital status: Married    Spouse name: Not on file   Number of children: Not on file   Years of education: Not on file   Highest education level: Not on file  Occupational History   Not on file  Tobacco Use   Smoking status: Never    Passive exposure: Never   Smokeless tobacco: Never  Vaping Use   Vaping Use: Never used  Substance and Sexual Activity   Alcohol use: No   Drug use: No   Sexual activity: Not on file  Other Topics Concern   Not on file  Social History Narrative   Lives with wife, [name], with on pet,  cat.   Social Determinants of Health   Financial Resource Strain: Not on file  Food Insecurity: No Food Insecurity (09/24/2022)   Hunger Vital Sign    Worried About Running Out of Food in the Last Year: Never true    Ran Out of Food in the Last Year: Never true  Transportation Needs: No Transportation Needs (09/24/2022)   PRAPARE - Hydrologist (Medical): No    Lack of Transportation (Non-Medical): No  Physical Activity: Not on file  Stress: Not on file  Social Connections: Not on file  Intimate Partner Violence: Not At Risk (09/24/2022)   Humiliation, Afraid, Rape, and Kick questionnaire    Fear of Current or Ex-Partner: No    Emotionally Abused: No    Physically Abused: No    Sexually Abused: No    Family History  Problem Relation Age of Onset   Varicose Veins Mother    Heart disease Mother    Diabetes Mother    COPD Father      Vitals:   10/02/22 0400 10/02/22 0500 10/02/22 0600 10/02/22 0700  BP: (!) 153/67 (!) 142/78 (!) 150/69 (!) 145/76  Pulse: (!) 114 82 (!) 107 (!) 109  Resp: (!) 30 (!) 29 (!) 30 (!) 28   Temp: 99.5 F (37.5 C)     TempSrc: Axillary     SpO2: 96% 95% 95% 92%  Weight:      Height:        PHYSICAL EXAM General: Pleasant acutely ill-appearing middle-aged Caucasian male, laying in incline in ICU bed, no family at bedside. HEENT:  Normocephalic and atraumatic.  Neck:  No JVD.  Lungs: Somewhat short of breath appearing on room air, saturating at 96%.  Shallow respirations with decreased bibasilar breath sounds without appreciable wheezes. Heart: irregularly irregular rhythm with controlled rate. Normal S1 and S2 without gallops or murmurs.  Abdomen: Distended appearing.  Ileostomy present RUQ. Dry dressing in place over midline incision. Msk: Generalized weakness. Extremities: Warm and well perfused. No clubbing, cyanosis.  Generally edematous upper and lower extremities.  SCDs Neuro: Alert and oriented X 3.   Psych: Appropriate for situation  Labs: Basic Metabolic Panel: Recent Labs    10/01/22 0136 10/02/22 0524  NA 133* 133*  K 3.9 3.8  CL 101 102  CO2 22 20*  GLUCOSE 165* 284*  BUN 52* 48*  CREATININE 4.39* 3.07*  CALCIUM 8.4* 8.3*  MG 2.6* 2.4  PHOS 2.1* 2.4*    Liver Function Tests: No results for input(s): "AST", "ALT", "ALKPHOS", "BILITOT", "PROT", "ALBUMIN" in the last 72 hours.  No results for input(s): "LIPASE", "AMYLASE" in the last 72 hours. CBC: Recent Labs    10/01/22 0136 10/02/22 0524  WBC 17.7* 29.5*  NEUTROABS 13.2* 22.8*  HGB 8.1* 7.7*  HCT 25.5* 24.4*  MCV 74.6* 73.9*  PLT 211 261    Cardiac Enzymes: No results for input(s): "CKTOTAL", "CKMB", "CKMBINDEX", "TROPONINIHS" in the last 72 hours.  BNP: Recent Labs    09/29/22 1025  BNP 476.6*    D-Dimer: No results for input(s): "DDIMER" in the last 72 hours. Hemoglobin A1C: No results for input(s): "HGBA1C" in the last 72 hours. Fasting Lipid Panel: Recent Labs    09/30/22 0413  TRIG 178*    Thyroid Function Tests: No results for input(s): "TSH", "T4TOTAL",  "T3FREE", "THYROIDAB" in the last 72 hours.  Invalid input(s): "FREET3"  Anemia Panel: No results for input(s): "VITAMINB12", "FOLATE", "FERRITIN", "TIBC", "IRON", "RETICCTPCT" in  the last 72 hours.   Radiology: ECHOCARDIOGRAM COMPLETE  Result Date: 09/29/2022    ECHOCARDIOGRAM REPORT   Patient Name:   Nicolas Zuniga Date of Exam: 09/29/2022 Medical Rec #:  [id number]     Height:       74.0 in Accession #:    [id number]    Weight:       260.1 lb Date of Birth:  Jan 19, 1956      BSA:          2.430 m Patient Age:    4 years      BP:           126/74 mmHg Patient Gender: M             HR:           83 bpm. Exam Location:  [facility] Procedure: 2D Echo, Color Doppler and Cardiac Doppler Indications:     Abnormal ECG R94.31  History:         Patient has prior history of Echocardiogram examinations, most                  recent 07/09/2016. Risk Factors:Hypertension and Diabetes.                  Paroxysmal Afib.  Sonographer:     Sherrie Sport Referring Phys:  [id number] Teressa Lower Diagnosing Phys: Serafina Royals MD  Sonographer Comments: Echo performed with patient supine and on artificial respirator, Technically challenging study due to limited acoustic windows and no subcostal window. IMPRESSIONS  1. Left ventricular ejection fraction, by estimation, is 65 to 70%. The left ventricle has normal function. The left ventricle has no regional wall motion abnormalities. Left ventricular diastolic parameters were normal.  2. Right ventricular systolic function is normal. The right ventricular size is normal.  3. The mitral valve is normal in structure. Trivial mitral valve regurgitation.  4. The aortic valve is normal in structure. Aortic valve regurgitation is not visualized. FINDINGS  Left Ventricle: Left ventricular ejection fraction, by estimation, is 65 to 70%. The left ventricle has normal function. The left ventricle has no regional wall motion abnormalities. The left ventricular internal cavity size was small. There is  no left ventricular hypertrophy. Left ventricular diastolic parameters were normal. Right Ventricle: The right ventricular size is normal. No increase in right ventricular wall thickness. Right ventricular systolic function is normal. Left Atrium: Left atrial size was normal in size. Right Atrium: Right atrial size was normal in size. Pericardium: There is no evidence of pericardial effusion. Mitral Valve: The mitral valve is normal in structure. Trivial mitral valve regurgitation. Tricuspid Valve: The tricuspid valve is normal in structure. Tricuspid valve regurgitation is trivial. Aortic Valve: The aortic valve is normal in structure. Aortic valve regurgitation is not visualized. Aortic valve mean gradient measures 2.0 mmHg. Aortic valve peak gradient measures 3.4 mmHg. Aortic valve area, by VTI measures 3.50 cm. Pulmonic Valve: The pulmonic valve was normal in structure. Pulmonic valve regurgitation is not visualized. Aorta: The aortic root and ascending aorta are structurally normal, with no evidence of dilitation. IAS/Shunts: No atrial level shunt detected by color flow Doppler.  LEFT VENTRICLE PLAX 2D LVIDd:         4.60 cm LVIDs:         3.30 cm LV PW:         1.20 cm LV IVS:        0.80 cm LVOT diam:     2.00  cm LV SV:         43 LV SV Index:   18 LVOT Area:     3.14 cm  RIGHT VENTRICLE RV Basal diam:  4.10 cm RV Mid diam:    3.80 cm RV S prime:     11.40 cm/s TAPSE (M-mode): 2.0 cm LEFT ATRIUM           Index        RIGHT ATRIUM           Index LA diam:      3.10 cm 1.28 cm/m   RA Area:     14.80 cm LA Vol (A2C): 40.0 ml 16.46 ml/m  RA Volume:   37.20 ml  15.31 ml/m LA Vol (A4C): 70.1 ml 28.84 ml/m  AORTIC VALVE AV Area (Vmax):    2.22 cm AV Area (Vmean):   2.37 cm AV Area (VTI):     3.50 cm AV Vmax:           91.60 cm/s AV Vmean:          67.100 cm/s AV VTI:            0.122 m AV Peak Grad:      3.4 mmHg AV Mean Grad:      2.0 mmHg LVOT Vmax:         64.70 cm/s LVOT Vmean:        50.700 cm/s LVOT  VTI:          0.136 m LVOT/AV VTI ratio: 1.11  AORTA Ao Root diam: 3.20 cm MITRAL VALVE               TRICUSPID VALVE MV Area (PHT): 3.40 cm    TR Peak grad:   19.4 mmHg MV Decel Time: 223 msec    TR Vmax:        220.00 cm/s MV E velocity: 85.90 cm/s                            SHUNTS                            Systemic VTI:  0.14 m                            Systemic Diam: 2.00 cm Serafina Royals MD Electronically signed by Serafina Royals MD Signature Date/Time: 09/29/2022/12:21:30 PM    Final    DG Abd 1 View  Result Date: 09/28/2022 CLINICAL DATA:  Enteric catheter placement, postop EXAM: ABDOMEN - 1 VIEW COMPARISON:  09/26/2022 FINDINGS: Frontal view of the lower chest and upper abdomen demonstrates enteric catheter passing below diaphragm, tip projecting over the gastric antrum. Left basilar consolidation compatible with atelectasis. Bowel gas pattern is unremarkable. IMPRESSION: 1. Enteric catheter tip projecting over the gastric antrum. Electronically Signed   By: Randa Ngo M.D.   On: 09/28/2022 15:52   DG Chest Port 1 View  Result Date: 09/28/2022 CLINICAL DATA:  Postop, intubated EXAM: PORTABLE CHEST 1 VIEW COMPARISON:  09/27/2022 FINDINGS: Single frontal view of the chest demonstrates endotracheal tube overlying tracheal air column tip at level of thoracic inlet. Enteric catheter passes below diaphragm tip excluded by collimation. Right-sided PICC tip overlies the right atrium. Numerous cardiac leads overlie the central chest. The cardiac silhouette is unremarkable. Lung volumes are diminished, with left basilar consolidation most  consistent with atelectasis. Trace left pleural effusion. No pneumothorax. No acute bony abnormalities. IMPRESSION: 1. Support devices as above. 2. Increasing left basilar consolidation, favor atelectasis over pneumonia. 3. Trace left pleural effusion. Electronically Signed   By: Randa Ngo M.D.   On: 09/28/2022 15:51   CT ABDOMEN PELVIS WO CONTRAST  Result  Date: 09/28/2022 CLINICAL DATA:  Status post right hemicolectomy 09/24/2022. Anastomotic complication suspected. EXAM: CT ABDOMEN AND PELVIS WITHOUT CONTRAST TECHNIQUE: Multidetector CT imaging of the abdomen and pelvis was performed following the standard protocol without IV contrast. RADIATION DOSE REDUCTION: This exam was performed according to the departmental dose-optimization program which includes automated exposure control, adjustment of the mA and/or kV according to patient size and/or use of iterative reconstruction technique. COMPARISON:  09/27/2022 FINDINGS: Lower chest: Bibasilar atelectasis noted with tiny bilateral pleural effusions. Hepatobiliary: No suspicious focal abnormality in the liver on this study without intravenous contrast. Gallbladder is surgically absent. No intrahepatic or extrahepatic biliary dilation. Pancreas: No focal mass lesion. No dilatation of the main duct. No intraparenchymal cyst. No peripancreatic edema. Spleen: No splenomegaly. No focal mass lesion. Adrenals/Urinary Tract: No adrenal nodule or mass. Contrast in the kidneys compatible with residual from yesterday's infuse scan. Small cyst noted left kidney. No evidence for hydroureter. The urinary bladder appears normal for the degree of distention. Stomach/Bowel: Stomach is distended with gas and fluid. Duodenum is normally positioned as is the ligament of Treitz. No small bowel wall thickening. No small bowel dilatation. The distal ileum at the ileocolic anastomosis in the proximal transverse colon at the anastomosis is well opacified with enteric contrast material. While there is no evidence for gross contrast extravasation at the level of the anastomosis, the small fluid collection identified on yesterday's study has increased in attenuation in the interval (26 Hounsfield units previously versus 67 Hounsfield units today). This increase in attenuation is concerning for some trace spill of contrast from the anastomosis  into the collection although blood products or infectious debris could cause increase in attenuation. This finding is associated with a slight increase in fluid volume in the right paracolic gutter. Diverticular changes are seen in the left colon as before. Vascular/Lymphatic: There is mild atherosclerotic calcification of the abdominal aorta without aneurysm. There is no gastrohepatic or hepatoduodenal ligament lymphadenopathy. No retroperitoneal or mesenteric lymphadenopathy. No pelvic sidewall lymphadenopathy. Reproductive: The hypoattenuating collection seen previously in the prostate gland are less prominent today. Other: Free fluid and free gas in the peritoneal cavity is again noted. Free gas is not unexpected 4 days out from surgery. As noted above, the volume of fluid in the right paracolic gutter is slightly increased in the interval. Musculoskeletal: Gas in the midline subcutaneous tissues deep to the staple line has increased in the interval No worrisome lytic or sclerotic osseous abnormality. IMPRESSION: 1. While there is no evidence for gross enteric contrast extravasation at the level of the ileocolic anastomosis, the small fluid collection adjacent to the anastomosis identified on yesterday's study has increased in attenuation in the interval. This increase in attenuation is concerning for some trace spill of contrast from the anastomosis into the collection. 2. Slight increase in fluid volume in the right paracolic gutter with similar fluid volume around the liver and in the left paracolic gutter. Collections of interloop mesenteric fluid are not substantially changed. 3. Free gas in the peritoneal cavity is not unexpected 4 days out from surgery. 4. Gas in the midline subcutaneous tissues deep to the staple line has increased in the  interval. 5. Contrast in the kidneys compatible with residual from yesterday's infused scan. 6. Bibasilar atelectasis with tiny bilateral pleural effusions. 7. Aortic  Atherosclerosis (ICD10-I70.0). Findings were Dr. Dahlia Byes at approximately 1105 hours on 09/28/2022. Electronically Signed   By: Misty Stanley M.D.   On: 09/28/2022 11:22   DG Chest Port 1 View  Result Date: 09/27/2022 CLINICAL DATA:  PICC line placement. EXAM: PORTABLE CHEST 1 VIEW COMPARISON:  Abdominal series 09/26/2022. CT abdomen and pelvis 09/27/2022 FINDINGS: Shallow inspiration with atelectasis in the lung bases. Cardiac enlargement. No airspace disease or consolidation in the lungs. A right PICC line has been placed with tip over the cavoatrial junction. No pneumothorax. Pneumoperitoneum is demonstrated below the right hemidiaphragm as seen on prior studies, likely postoperative. IMPRESSION: 1. Right PICC line appears in satisfactory position. 2. Shallow inspiration with atelectasis in the lung bases. Cardiac enlargement. 3. Pneumoperitoneum as seen on prior studies, probably postoperative. Electronically Signed   By: Lucienne Capers M.D.   On: 09/27/2022 19:25   [place] EKG SITE RITE  Result Date: 09/27/2022 If Site Rite image not attached, placement could not be confirmed due to current cardiac rhythm.  CT ABDOMEN PELVIS W CONTRAST  Result Date: 09/27/2022 CLINICAL DATA:  66 year old male with RIGHT colectomy for adenocarcinoma and cholecystectomy performed on 09/24/2022. Hypotension and abdominal discomfort. EXAM: CT ABDOMEN AND PELVIS WITH CONTRAST TECHNIQUE: Multidetector CT imaging of the abdomen and pelvis was performed using the standard protocol following bolus administration of intravenous contrast. RADIATION DOSE REDUCTION: This exam was performed according to the departmental dose-optimization program which includes automated exposure control, adjustment of the mA and/or kV according to patient size and/or use of iterative reconstruction technique. CONTRAST:  181m OMNIPAQUE IOHEXOL 350 MG/ML SOLN COMPARISON:  09/17/2022 CT FINDINGS: Lower chest: Trace bilateral pleural effusions and  bibasilar atelectasis noted. Hepatobiliary: The liver is unremarkable. The patient is status post cholecystectomy. There is no evidence of intrahepatic or extrahepatic biliary dilatation. Pancreas: A small amount of ill-defined fluid anterior to the pancreas is noted but the remainder of the pancreas is unremarkable. Spleen: Unremarkable Adrenals/Urinary Tract: No significant abnormalities of the kidneys, adrenal glands or bladder noted. No evidence of hydronephrosis. Stomach/Bowel: RIGHT colectomy and ileo colonic anastomosis noted. There is a small amount of ill-defined high density fluid along the suture line (images 35-39: Series 4), which may represent a small amount of extravasated contrast/leak. There is a small amount of ascites within the abdomen and pelvis with small to moderate amount of pneumoperitoneum within the abdomen. There is no evidence of focal collection/defined abscess. There is no evidence of bowel obstruction. No definite bowel wall thickening is identified. Vascular/Lymphatic: Aortic atherosclerosis. No enlarged abdominal or pelvic lymph nodes. Reproductive: 2 low-density areas within the prostate gland are noted, measuring 1.7 cm on the LEFT and 1 cm on the RIGHT. Other: Ill-defined stranding within the abdomen and pelvis noted likely recent surgical changes. Musculoskeletal: No acute or suspicious bony abnormalities are identified. IMPRESSION: 1. Ill-defined slightly high density fluid along the ileocolic suture line, which may represent a small amount of extravasated contrast/leak. Small to moderate amount of pneumoperitoneum and small amount of ascites within the abdomen and pelvis, not unexpected given surgery but nonspecific. No evidence of focal collection/defined abscess. 2. Trace bilateral pleural effusions and bibasilar atelectasis. 3. 2 low-density areas within the prostate gland, nonspecific but correlate with PSA. 4.  Aortic Atherosclerosis (ICD10-I70.0). Critical Value/emergent  results discussed by phone at the time of interpretation on 09/27/2022 at 1:50 pm  to provider DIEGO PABON , who verbally acknowledged these results. Electronically Signed   By: Margarette Canada M.D.   On: 09/27/2022 13:51   CT Angio Chest Pulmonary Embolism (PE) W or WO Contrast  Result Date: 09/27/2022 CLINICAL DATA:  Shortness of breath. EXAM: CT ANGIOGRAPHY CHEST WITH CONTRAST TECHNIQUE: Multidetector CT imaging of the chest was performed using the standard protocol during bolus administration of intravenous contrast. Multiplanar CT image reconstructions and MIPs were obtained to evaluate the vascular anatomy. RADIATION DOSE REDUCTION: This exam was performed according to the departmental dose-optimization program which includes automated exposure control, adjustment of the mA and/or kV according to patient size and/or use of iterative reconstruction technique. CONTRAST:  152m OMNIPAQUE IOHEXOL 350 MG/ML SOLN COMPARISON:  September 17, 2022. FINDINGS: Cardiovascular: Satisfactory opacification of the pulmonary arteries to the segmental level. No evidence of pulmonary embolism. Normal heart size. No pericardial effusion. Mediastinum/Nodes: No enlarged mediastinal, hilar, or axillary lymph nodes. Thyroid gland, trachea, and esophagus demonstrate no significant findings. Lungs/Pleura: No pneumothorax is noted. Minimal bilateral pleural effusions are noted with adjacent subsegmental atelectasis. Upper Abdomen: Pneumoperitoneum is noted in the visualized portion of upper abdomen consistent with the reported history of recent laparoscopic right colectomy and cholecystectomy. Musculoskeletal: No chest wall abnormality. No acute or significant osseous findings. Review of the MIP images confirms the above findings. IMPRESSION: No definite evidence of pulmonary embolus. Minimal bilateral pleural effusions are noted with adjacent subsegmental atelectasis. Pneumoperitoneum is noted in visualized portion of upper abdomen  consistent with reported history of recent laparoscopic right colectomy and cholecystectomy. Electronically Signed   By: JMarijo ConceptionM.D.   On: 09/27/2022 13:38   DG ABD ACUTE 2+V W 1V CHEST  Result Date: 09/26/2022 CLINICAL DATA:  Ileus following open right colectomy on 09/24/2022 EXAM: DG ABDOMEN ACUTE WITH 1 VIEW CHEST COMPARISON:  09/17/2022 FINDINGS: Gaseous distension of the stomach. Air is seen within the colon. No dilated loops of small bowel. Small-moderate volume pneumoperitoneum. Numerous surgical clips within the right hemiabdomen. Midline abdominal skin staples. Heart size within normal limits. Low lung volumes. Trace left pleural effusion with left basilar atelectasis. No pneumothorax. IMPRESSION: 1. Small-moderate volume pneumoperitoneum, likely related to recent surgery. 2. Gaseous distension of the stomach. No dilated loops of small bowel to suggest obstruction. Electronically Signed   By: NDavina PokeD.O.   On: 09/26/2022 10:57   CT Chest W Contrast  Result Date: 09/18/2022 CLINICAL DATA:  Colon cancer diagnosed on 09/08/2022. Elevated CEA. Staging evaluation. EXAM: CT CHEST, ABDOMEN, AND PELVIS WITH CONTRAST TECHNIQUE: Multidetector CT imaging of the chest, abdomen and pelvis was performed following the standard protocol during bolus administration of intravenous contrast. RADIATION DOSE REDUCTION: This exam was performed according to the departmental dose-optimization program which includes automated exposure control, adjustment of the mA and/or kV according to patient size and/or use of iterative reconstruction technique. CONTRAST:  1032mOMNIPAQUE IOHEXOL 300 MG/ML  SOLN COMPARISON:  Abdomen and pelvis CT dated 03/07/2020 FINDINGS: CT CHEST FINDINGS Cardiovascular: Minimal coronary artery calcification. Normal sized heart. Mediastinum/Nodes: No enlarged mediastinal, hilar, or axillary lymph nodes. Thyroid gland, trachea, and esophagus demonstrate no significant findings.  Lungs/Pleura: Minimal linear atelectasis or scarring at the left lung base. 5 x 3 mm left lower lobe nodule with a mean diameter of 4 mm on image number 100/5. Taking differences in slice thickness into account, this demonstrates little if any change since 03/07/2020. There is also a small calcified granuloma more superiorly in the left lower lobe  on image number 77/5. On image number 97/5 and coronal image number 58/6, there is a 4 x 2 mm oval nodular density with a mean diameter of 3 mm. It is difficult determine if this is a lung nodule or prominent vessel branch point. No other lung nodules seen. No pleural fluid. Musculoskeletal: Thoracic and lower cervical spine degenerative changes. No evidence of bony metastatic disease. CT ABDOMEN PELVIS FINDINGS Hepatobiliary: Multiple small noncalcified gallstones in the dependent portion of the gallbladder measuring up 5 mm in maximum diameter each. No gallbladder wall thickening or pericholecystic fluid. Unremarkable liver. No liver masses are seen. Pancreas: Unremarkable. No pancreatic ductal dilatation or surrounding inflammatory changes. Spleen: Normal in size without focal abnormality. Adrenals/Urinary Tract: Normal appearing adrenal glands. Small bilateral renal cysts. These do not need imaging follow-up. Poorly distended urinary bladder. No ureteral abnormalities seen. Stomach/Bowel: Interval concentric mass involving the right colon and ileocecal valve measuring 7.3 cm in length on coronal image number 88/6 with a maximum wall thickness of 1.7 cm on the same image. This mass is confluent with an immediately adjacent mass medially with central low density, measuring 3.8 x 3.0 cm on image number 92/3 and 4.1 cm in length on coronal image number 83/6. There are multiple adjacent lymph node medial to these masses. The largest has a short axis diameter of 2.4 cm on image number 86/3. Multiple descending and sigmoid colon diverticula. Mild wall thickening involving the  terminal ileum at the ileocecal valve. The remainder of the small bowel is unremarkable. Normal-appearing appendix and stomach. Vascular/Lymphatic: Mild atheromatous arterial calcifications without aneurysm. Multiple enlarged mesenteric lymph nodes medial to the right colon mass, as described above. No other enlarged lymph nodes seen. Reproductive: Prostate is unremarkable. Other: No abdominal wall hernia or abnormality. No abdominopelvic ascites. Musculoskeletal: Lumbar spine degenerative changes. Mild bilateral hip degenerative changes. No evidence of bony metastatic disease. IMPRESSION: 1. Interval concentric mass involving the right colon, ileocecal valve and distal terminal ileum, compatible with the patient's known colon carcinoma. No associated obstruction. 2. Adjacent mass with central low density, compatible with local extension of necrotic tumor or an enlarged, necrotic metastatic lymph node. 3. Multiple adjacent enlarged mesenteric lymph nodes medial to these masses, compatible with metastatic adenopathy. 4. Colonic diverticulosis. 5. Cholelithiasis. 6. 4 mm mean diameter left lower lobe nodule possible 3 mm mean diameter right middle lobe nodule. These are felt to have a low likelihood of representing metastatic disease and could be followed on subsequent examinations. Electronically Signed   By: Claudie Revering M.D.   On: 09/18/2022 17:04   CT Abdomen Pelvis W Contrast  Result Date: 09/18/2022 CLINICAL DATA:  Colon cancer diagnosed on 09/08/2022. Elevated CEA. Staging evaluation. EXAM: CT CHEST, ABDOMEN, AND PELVIS WITH CONTRAST TECHNIQUE: Multidetector CT imaging of the chest, abdomen and pelvis was performed following the standard protocol during bolus administration of intravenous contrast. RADIATION DOSE REDUCTION: This exam was performed according to the departmental dose-optimization program which includes automated exposure control, adjustment of the mA and/or kV according to patient size  and/or use of iterative reconstruction technique. CONTRAST:  161m OMNIPAQUE IOHEXOL 300 MG/ML  SOLN COMPARISON:  Abdomen and pelvis CT dated 03/07/2020 FINDINGS: CT CHEST FINDINGS Cardiovascular: Minimal coronary artery calcification. Normal sized heart. Mediastinum/Nodes: No enlarged mediastinal, hilar, or axillary lymph nodes. Thyroid gland, trachea, and esophagus demonstrate no significant findings. Lungs/Pleura: Minimal linear atelectasis or scarring at the left lung base. 5 x 3 mm left lower lobe nodule with a mean diameter of 4  mm on image number 100/5. Taking differences in slice thickness into account, this demonstrates little if any change since 03/07/2020. There is also a small calcified granuloma more superiorly in the left lower lobe on image number 77/5. On image number 97/5 and coronal image number 58/6, there is a 4 x 2 mm oval nodular density with a mean diameter of 3 mm. It is difficult determine if this is a lung nodule or prominent vessel branch point. No other lung nodules seen. No pleural fluid. Musculoskeletal: Thoracic and lower cervical spine degenerative changes. No evidence of bony metastatic disease. CT ABDOMEN PELVIS FINDINGS Hepatobiliary: Multiple small noncalcified gallstones in the dependent portion of the gallbladder measuring up 5 mm in maximum diameter each. No gallbladder wall thickening or pericholecystic fluid. Unremarkable liver. No liver masses are seen. Pancreas: Unremarkable. No pancreatic ductal dilatation or surrounding inflammatory changes. Spleen: Normal in size without focal abnormality. Adrenals/Urinary Tract: Normal appearing adrenal glands. Small bilateral renal cysts. These do not need imaging follow-up. Poorly distended urinary bladder. No ureteral abnormalities seen. Stomach/Bowel: Interval concentric mass involving the right colon and ileocecal valve measuring 7.3 cm in length on coronal image number 88/6 with a maximum wall thickness of 1.7 cm on the same  image. This mass is confluent with an immediately adjacent mass medially with central low density, measuring 3.8 x 3.0 cm on image number 92/3 and 4.1 cm in length on coronal image number 83/6. There are multiple adjacent lymph node medial to these masses. The largest has a short axis diameter of 2.4 cm on image number 86/3. Multiple descending and sigmoid colon diverticula. Mild wall thickening involving the terminal ileum at the ileocecal valve. The remainder of the small bowel is unremarkable. Normal-appearing appendix and stomach. Vascular/Lymphatic: Mild atheromatous arterial calcifications without aneurysm. Multiple enlarged mesenteric lymph nodes medial to the right colon mass, as described above. No other enlarged lymph nodes seen. Reproductive: Prostate is unremarkable. Other: No abdominal wall hernia or abnormality. No abdominopelvic ascites. Musculoskeletal: Lumbar spine degenerative changes. Mild bilateral hip degenerative changes. No evidence of bony metastatic disease. IMPRESSION: 1. Interval concentric mass involving the right colon, ileocecal valve and distal terminal ileum, compatible with the patient's known colon carcinoma. No associated obstruction. 2. Adjacent mass with central low density, compatible with local extension of necrotic tumor or an enlarged, necrotic metastatic lymph node. 3. Multiple adjacent enlarged mesenteric lymph nodes medial to these masses, compatible with metastatic adenopathy. 4. Colonic diverticulosis. 5. Cholelithiasis. 6. 4 mm mean diameter left lower lobe nodule possible 3 mm mean diameter right middle lobe nodule. These are felt to have a low likelihood of representing metastatic disease and could be followed on subsequent examinations. Electronically Signed   By: Claudie Revering M.D.   On: 09/18/2022 17:04    ECHO 04/2022 EF >55%, G1 DD with normal LV and RV systolic function  TELEMETRY reviewed by me (LT) 10/02/2022 : AF, rate acceptable in the 90s-low 100s  EKG  reviewed by me: 09/25/2022 sinus rhythm rate 60s  Data reviewed by me (LT) 10/02/2022: Surgery note, hospitalist progress note, nephrology progress note, CBC, BMP, I's and O's, vitals, telemetry  Principal Problem:   S/P right colectomy Active Problems:   Diabetes ([place])   Essential hypertension   Paroxysmal A-fib ([facility])   S/P partial resection of colon   Hypotension   Hyponatremia   AKI (acute kidney injury) ([place])   Postoperative intra-abdominal abscess   Adenocarcinoma of colon ([place])   Persistent atrial fibrillation ([place])  Sepsis ([place])   Diabetic ketoacidosis without coma associated with type 2 diabetes mellitus ([place])    ASSESSMENT AND PLAN:  Nicolas Zuniga is a 26yoM with a PMH of paroxysmal atrial fibrillation (on flecainide, metoprolol, and Eliquis), diabetes, hypertension, recently diagnosed adenocarcinoma of the colon who presented to [facility] 09/24/2022 for and elective laparoscopic right colectomy ultimately converted to open right colectomy and cholecystectomy.  Postoperative course was complicated by hypotension and bradycardia the evening of postop day 1, concerning for beta-blocker toxicity for which he was started on glucagon infusion.  Rapid response was called overnight on 10/21 for atrial fibrillation with RVR and questionable VT, was started on amiodarone and diltiazem infusions with some heart rate response.  #Adenocarcinoma of the colon s/p open R colectomy & CCY 10/19 #S/p exploratory laparotomy, abdominal washout, and diverting loop ileostomy 10/23 #Intra-abdominal abscess with ?Septic shock -Remains vasopressor support entirely since 10/25. -Procalcitonin trend 21.52-18.25-20.55, leukocytosis worsening from 14.06-22-28.5 today  #Paroxysmal atrial fibrillation with RVR Was previously on flecainide, metoprolol, and Eliquis which have all been held after rapid response on 10/21.  His heart rates were difficult to control, and was in A-fib in the 120s at rest with paroxysms  to the 150s throughout the day of 10/23 and converted to atrial flutter with controlled ventricular response in the evening.  Bradycardic overnight so diltiazem and Precedex infusions were discontinued on 10/24.  Since 10/25 he has remained in atrial fibrillation with acceptable rate control in the 90s to low 100s primarily. -Continue to treat causes of increased adrenergic tone including pain, infection, hypoxia -Monitor and replete electrolytes to maintain a K >4, mag >2 -Discontinue amiodarone infusion today, will convert to p.o. 200 mg twice daily x2 days, then 200 mg daily thereafter -Restart low-dose metoprolol tartrate 25 mg twice daily-Hold diltiazem infusion, can restart if heart rate is uncontrolled. -Revisit ablation on an outpatient basis with Dr. Mylinda Latina. -CHA2DS2-VASc 3 (age, htn, dm2).   -Continue heparin infusion while inpatient, restart Eliquis 5 mg twice daily at discharge or once no further procedures are planned if okay from a surgical standpoint.  #Chronic HFpEF (EF 65-70% 09/2022, previous >55% 04/2022) Remains net + 7 L, required IVF for hypotension earlier in hospital course.  Not significantly clinically volume overloaded, although chest x-ray shows with L pleural effusion. -BNP slightly elevated at 400 -Agree with diuresis if okay with nephrology  #Acute renal failure Renal function with steady downtrend was read today plateau.  He continues to make urine. -BUN/creatinine 46/4.61 and GFR of 13 today Query ATN with significant hypotension requiring vasopressor support.  His renal function has been labile since 10/20. on day of admission his baseline was BUN/creatinine 22/1.23 and GFR >60.  He has received multiple CT scans with contrast this admission. -Nephrology consulted by primary team, appreciate their assistance  #Postoperative anemia Hgb down trended from 9.1 to a low of 7.3 the evening of 10/23, remains stable at 7.7 today  This patient's plan of care was  discussed and created with Dr. Nehemiah Massed and he is in agreement.  Signed: Tristan Schroeder , PA-C 10/02/2022, 7:53 AM [facility]

## 2022-10-02 NOTE — Progress Notes (Signed)
PT Cancellation Note  Patient Details Name: Nicolas Zuniga MRN: [id number] DOB: 1956-06-22   Cancelled Treatment:     Pt is experiencing abdominal discomfort this date per nursing. Will try to reassess tomorrow for activity tolerance and functional progression. Pt is motivated and remains a great candidate for CIR. Continue per POC   Josie Dixon 10/02/2022, 2:36 PM

## 2022-10-02 NOTE — Progress Notes (Signed)
Nutrition Follow-up  DOCUMENTATION CODES:   Obesity unspecified  INTERVENTION:   -TPN management per pharmacy -Magic cup TID with meals, each supplement provides 290 kcal and 9 grams of protein  -RD will follow for diet advancement and adjust care plan as appropriate  NUTRITION DIAGNOSIS:   Increased nutrient needs related to post-op healing as evidenced by estimated needs.  Ongoing  GOAL:   Patient will meet greater than or equal to 90% of their needs  Progressing   MONITOR:   Diet advancement, Labs, Weight trends, Skin, I & O's  REASON FOR ASSESSMENT:   Rounds    ASSESSMENT:   66 y/o male with h/o DM, HTN, Afib s/p cardioversion, anxiety, HLD, hiatal hernia, cholelithiasis and colon cancer s/p open right colectomy with ileocolic anastomosis and cholecystectomy 76/72 complicated by anastamotic leak s/p exploratory laparotomy and abdominal washout and diverting loop ileostomy 10/23. Pt also with new DKA and AKI.  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 Pt remains at goal rate of TPN (90 ml/hr), which provides 2567 kcals and 145 grams protein, meeting 100% of estimated nutritional needs.   Spoke with pt and wife at bedside. Pt reports feeling "worn out" after being cleaned and working with therapy (transferred to chair yesterday). Pt is having ileostomy output. He has no appetite  and does not feel like eating much. He has mostly been sipping on water and ice chips, but thinks he will drink some broth later. RD discussed diet advancement with pt and reviewed which foods are available per his diet order. Offered to provide him with a food item or supplement, however, he declined at this time. Encouraged pt to try to sip on food items and assured him that he currently receiving 100% of his needs via TPN.   DM coordinator provided recommendations for glycemic control.  Labs reviewed: Na: 133, CBGS: 213-298 (inpatient orders for glycemic control are 0-20 units insulin aspart every 4 hours, 3 units insulin aspart every 4 hours, and 15 units insulin detemir BID).    Diet Order:   Diet Order             Diet full liquid Room service appropriate? Yes; Fluid consistency: Thin  Diet effective now                   EDUCATION NEEDS:   No education needs have been identified at this time  Skin:  Skin Assessment: Skin Integrity Issues: Skin Integrity Issues:: DTI DTI: lt nare Incisions: closed abdomen  Last BM:  10/02/22 (50 ml via ileostomy)  Height:   Ht Readings from Last 1 Encounters:  09/28/22 '6\' 2"'$  (1.88 m)    Weight:   Wt Readings from Last 1 Encounters:  10/01/22 (!) 138.9 kg    Ideal Body Weight:  86.4 kg  BMI:  Body mass index is 39.32 kg/m.  Estimated Nutritional Needs:   Kcal:  2700-3000kcal/day  Protein:  135-150g/day  Fluid:  2.2-2.5L/day    Loistine Chance, RD, LDN, [place] Registered Dietitian II Certified Diabetes Care and Education Specialist Please refer to [facility] for RD and/or RD on-call/weekend/after hours pager

## 2022-10-02 NOTE — Progress Notes (Signed)
Pt transferred to 242A. VS stable. Belongings with the patient. Wife called and updated.

## 2022-10-02 NOTE — Progress Notes (Signed)
Nicolas Zuniga, [place] 10/02/22  Subjective:   Hospital day # 8  Patient is critically ill.  No family at bedside. States he feels worse than yesterday Alert and oriented Room air Remains on amiodarone and heparin drip TPN at  90 cc/h. Foley catheter NGT removed  Renal: 10/26 0701 - 10/27 0700 In: 2744.2 [P.O.:120; I.V.:2474.2; IV Piggyback:150] Out: 1610 [Urine:3275; Emesis/NG output:50; Drains:25; RUEAV:409] Lab Results  Component Value Date   CREATININE 3.07 (H) 10/02/2022   CREATININE 4.39 (H) 10/01/2022   CREATININE 4.61 (H) 09/30/2022     Objective:  Vital signs in last 24 hours:  Temp:  [97.9 F (36.6 C)-99.8 F (37.7 C)] 99.5 F (37.5 C) (10/27 0400) Pulse Rate:  [34-118] 97 (10/27 1000) Resp:  [23-37] 28 (10/27 1000) BP: (123-158)/(60-78) 154/77 (10/27 1000) SpO2:  [91 %-99 %] 97 % (10/27 1000)  Weight change:  Filed Weights   09/27/22 0148 09/30/22 0500 10/01/22 0427  Weight: 118 kg (!) 139.3 kg (!) 138.9 kg    Intake/Output:    Intake/Output Summary (Last 24 hours) at 10/02/2022 1057 Last data filed at 10/02/2022 1001 Gross per 24 hour  Intake 3175.02 ml  Output 3690 ml  Net -514.98 ml      Physical Exam: General: ill-appearing, laying in the bed  HEENT Normocephalic  Pulm/lungs Normal breathing effort, Diminished   CVS/Heart Irregular, A-fib  Abdomen:  ostomy in place  Extremities: Dependent peripheral edema present  Neurologic: Alert, able to answer simple questions  Skin: No acute rashes          Basic Metabolic Panel:  Recent Labs  Lab 09/28/22 0459 09/28/22 1449 09/28/22 1450 09/28/22 1807 09/29/22 0106 09/29/22 1041 09/29/22 1509 09/29/22 2042 09/30/22 0413 10/01/22 0136 10/02/22 0524  NA 128*   < >  --    < > 130*   < > 130* 128* 131* 133* 133*  K 4.0   < >  --    < > 4.1   < > 3.8 3.6 3.4* 3.9 3.8  CL 96*   < >  --    < > 94*   < > 98 97* 99 101 102  CO2 16*   < >  --    < > 16*   < >  19* 21* 22 22 20*  GLUCOSE 204*   < >  --    < > 324*   < > 153* 191* 150* 165* 284*  BUN 25*   < >  --    < > 29*   < > 40* 44* 46* 52* 48*  CREATININE 1.86*   < >  --    < > 2.97*   < > 4.12* 4.33* 4.61* 4.39* 3.07*  CALCIUM 8.4*   < >  --    < > 7.5*   < > 8.7* 8.3* 8.4* 8.4* 8.3*  MG 1.9  --   --   --  1.8  --   --   --  2.6* 2.6* 2.4  PHOS  --   --  4.4  --  3.1  --   --   --  1.6* 2.1* 2.4*   < > = values in this interval not displayed.      CBC: Recent Labs  Lab 09/28/22 1450 09/28/22 1807 09/29/22 0106 09/30/22 0413 10/01/22 0136 10/02/22 0524  WBC 14.2*  --  13.4* 14.7* 17.7* 29.5*  NEUTROABS 9.3*  --  9.0* 10.6* 13.2* 22.8*  HGB  7.6* 7.3* 7.6* 7.6* 8.1* 7.7*  HCT 26.0* 25.1* 25.4* 24.2* 25.5* 24.4*  MCV 80.2  --  78.9* 74.5* 74.6* 73.9*  PLT 259  --  254 199 211 261      No results found for: "HEPBSAG", "HEPBSAB", "HEPBIGM"    Microbiology:  Recent Results (from the past 240 hour(s))  MRSA Next Gen by PCR, Nasal     Status: None   Collection Time: 09/27/22  1:53 AM   Specimen: Nasal Mucosa; Nasal Swab  Result Value Ref Range Status   MRSA by PCR Next Gen NOT DETECTED NOT DETECTED Final    Comment: (NOTE) The GeneXpert MRSA Assay (FDA approved for NASAL specimens only), is one component of a comprehensive MRSA colonization surveillance program. It is not intended to diagnose MRSA infection nor to guide or monitor treatment for MRSA infections. Test performance is not FDA approved in patients less than 54 years old. Performed at [facility], [address] [place], [place] [address]   Culture, Respiratory w Gram Stain     Status: None   Collection Time: 09/29/22  8:23 AM   Specimen: Tracheal Aspirate; Respiratory  Result Value Ref Range Status   Specimen Description   Final    TRACHEAL ASPIRATE Performed at [facility], [address], [place], [place] [address]    Special Requests   Final    NONE Performed at [facility], [address] [place], [place] [address]    Gram Stain   Final    RARE WBC PRESENT, PREDOMINANTLY PMN RARE BUDDING YEAST SEEN Performed at [facility], [place] [address] [place], [place] [address]    Culture   Final    MODERATE SERRATIA MARCESCENS FEW CANDIDA ALBICANS    Report Status 10/01/2022 FINAL  Final   Organism ID, Bacteria SERRATIA MARCESCENS  Final      Susceptibility   Serratia marcescens - MIC*    CEFAZOLIN >=64 RESISTANT Resistant     CEFEPIME <=0.12 SENSITIVE Sensitive     CEFTAZIDIME <=1 SENSITIVE Sensitive     CEFTRIAXONE 1 SENSITIVE Sensitive     CIPROFLOXACIN <=0.25 SENSITIVE Sensitive     GENTAMICIN <=1 SENSITIVE Sensitive     TRIMETH/SULFA <=20 SENSITIVE Sensitive     * MODERATE SERRATIA MARCESCENS    Coagulation Studies: No results for input(s): "LABPROT", "INR" in the last 72 hours.   Urinalysis: No results for input(s): "COLORURINE", "LABSPEC", "PHURINE", "GLUCOSEU", "HGBUR", "BILIRUBINUR", "KETONESUR", "PROTEINUR", "UROBILINOGEN", "NITRITE", "LEUKOCYTESUR" in the last 72 hours.  Invalid input(s): "APPERANCEUR"    Imaging: No results found.   Medications:    heparin 2,700 Units/hr (10/02/22 1046)   piperacillin-tazobactam (ZOSYN)  IV 3.375 g (10/02/22 1002)   TPN ADULT (ION) 90 mL/hr at 10/02/22 1000   TPN ADULT (ION)      amiodarone  200 mg Oral BID   Followed by   Derrill Memo ON 10/04/2022] amiodarone  200 mg Oral Daily   Chlorhexidine Gluconate Cloth  6 each Topical Daily   insulin aspart  0-20 Units Subcutaneous Q4H   insulin detemir  20 Units Subcutaneous Q24H   metoprolol tartrate  25 mg Oral BID   pantoprazole (PROTONIX) IV  40 mg Intravenous QHS   sodium chloride flush  10-40 mL Intracatheter Q12H   dextrose, morphine injection, ondansetron **OR** ondansetron (ZOFRAN) IV, mouth rinse, oxyCODONE, prochlorperazine **OR** prochlorperazine, sodium chloride flush  Assessment/ Plan:  66 y.o. male with  medical  problems of atrial fibrillation, history of  cardioversion, colon mass, hyperlipidemia, type 2 diabetes admitted on 09/24/2022 for S/P right colectomy [Z90.49] S/P partial resection of colon [Z90.49]  1.  Acute kidney injury Baseline creatinine 1.06 from 09/14/2022.     AKI likely secondary to ATN, oligoanuric Renal function stable and adequate UOP recorded. No need for dialysis at this time. Expect renal function to continue to slowly improve. Avoid nephrotoxic agents and therapies, also avoid hypotension.     2.  Acute respiratory failure Room air    LOS: [place] 10/27/202310:57 [place], [place]

## 2022-10-02 NOTE — Progress Notes (Signed)
[facility] Hospital Day(s): 8.   Post op day(s): 4 Days Post-Op.   Interval History:  Patient seen and examined No acute events or new complaints overnight.  Patient reports he is doing well; increased abdominal pain after working with therapies  No fever, chills, nausea, emesis  Leukocytosis continues to climb; 29.5K Hgb stable to 7.7 Renal function making improvements; sCr - 3.90; UO - 3275 ccs. Nephrology on board  Mild hypophosphatemia to 2.4 NGT removed after passing clamping trial yesterday (10/26) Surgical drain; 25 ccs; serosanguinous Now with output from diverting loop ileostomy; thin, bilious   Vital signs in last 24 hours: [min-max] current  Temp:  [97.9 F (36.6 C)-99.8 F (37.7 C)] 99.5 F (37.5 C) (10/27 0400) Pulse Rate:  [34-118] 109 (10/27 0700) Resp:  [23-37] 28 (10/27 0700) BP: (123-153)/(60-78) 145/76 (10/27 0700) SpO2:  [91 %-99 %] 92 % (10/27 0700)     Height: '6\' 2"'$  (188 cm) Weight: (!) 138.9 kg BMI (Calculated): 39.3   Intake/Output last 2 shifts:  10/26 0701 - 10/27 0700 In: 2682 [P.O.:120; I.V.:2412; IV Piggyback:150] Out: 8119 [Urine:3275; Emesis/NG output:50; Drains:25; Stool:270]   Physical Exam:  Constitutional: Alert; awake, NAD Respiratory: No respiratory distress; on [place] Cardiovascular: Atrial fibrillation; 90-100 bpm Gastrointestinal: Soft, he does not appear tender, non-distended, no rebound/guarding. Surgical drain with serosanguinous output. Diverting loop colostomy in right abdomen; bowel sweat in bag; no significant stool  Genitourinary: Foley in place; urine clear  Integumentary: Midline laparotomy healing via secondary intention, no erythema   Labs:     Latest Ref Rng & Units 10/02/2022    5:24 AM 10/01/2022    1:36 AM 09/30/2022    4:13 AM  CBC  WBC 4.0 - 10.5 K/uL 29.5  17.7  14.7   Hemoglobin 13.0 - 17.0 g/dL 7.7  8.1  7.6   Hematocrit 39.0 - 52.0 % 24.4  25.5  24.2   Platelets 150  - 400 K/uL 261  211  199       Latest Ref Rng & Units 10/02/2022    5:24 AM 10/01/2022    1:36 AM 09/30/2022    4:13 AM  CMP  Glucose 70 - 99 mg/dL 284  165  150   BUN 8 - 23 mg/dL 48  52  46   Creatinine 0.61 - 1.24 mg/dL 3.07  4.39  4.61   Sodium 135 - 145 mmol/L 133  133  131   Potassium 3.5 - 5.1 mmol/L 3.8  3.9  3.4   Chloride 98 - 111 mmol/L 102  101  99   CO2 22 - 32 mmol/L '20  22  22   '$ Calcium 8.9 - 10.3 mg/dL 8.3  8.4  8.4      Imaging studies: No new pertinent imaging studies   Assessment/Plan: 66 y.o. male 4 Days Post-Op s/p exploratory laparotomy, abdominal washout, and diverting loop ileostomy for intra-abdominal purulent fluid after initial open right colectomy with open cholecystectomy for right colon cancer and cholelithiasis on 10/19   - Closely monitor leukocytosis; unsure source. Clinically making improvements and drain serous. Will likely need repeat CT Abdomen/Pelvis at some point to ensure no intra-abdominal source   - Advance to FLD + Nutritional supplementation   - Continue TPN; at goal. Once tolerating FLD we can consider weaning this   - Continue IV Abx (Zosyn)  - Continue foley catheter for UO monitoring; sCr improved slightly, making urine; nephrology following  - Monitor abdominal examination; on-going  ileostomy function  - Midline wound care: Pack daily with saline moistened gauze, cover, secure  - Continue surgical drain; monitor and record output  - Pain control prn; antiemetics prn  - Okay to transfer from step down   - Appreciate cardiology assistance as well   All of the above findings and recommendations were discussed with the patient, and the medical team.  -- Edison Simon, PA-C [facility] 10/02/2022, 7:24 AM M-F: 7am - 4pm

## 2022-10-02 NOTE — Inpatient Diabetes Management (Signed)
Inpatient Diabetes Program Recommendations  AACE/ADA: New Consensus Statement on Inpatient Glycemic Control (2015)  Target Ranges:  Prepandial:   less than 140 mg/dL      Peak postprandial:   less than 180 mg/dL (1-2 hours)      Critically ill patients:  140 - 180 mg/dL   Lab Results  Component Value Date   GLUCAP 298 (H) 10/02/2022   HGBA1C 9.8 (H) 09/24/2022    Latest Reference Range & Units 10/01/22 07:31 10/01/22 12:32 10/01/22 19:22 10/01/22 23:31 10/02/22 03:33 10/02/22 07:50  Glucose-Capillary 70 - 99 mg/dL 179 (H) 200 (H) 213 (H) 271 (H) 247 (H) 298 (H)  (H): Data is abnormally high  Diabetes history: DM2 Outpatient Diabetes medications: Glipizide XL 10 mg BID, Metformin 500 mg QAM, Ozempic 1 mg Qweek Current orders for Inpatient glycemic control: Levemir 20 units Q24H, Novolog 0-20 units Q4H; TPN @ 90 ml/hr  Inpatient Diabetes Program Recommendations:   Noted patient increased to full liquid diet. Please consider while patient is on TPN. -Change Levemir to 15 units bid -Add Novolog 3 units q 4 hrs. And taper as TPN tapers Discussed with Vallery Sa, pharmacist.  Thank you, Nani Gasser. Lucien Budney, RN, MSN, CDE  Diabetes Coordinator Inpatient Glycemic Control Team Team Pager [phone] (8am-5pm) 10/02/2022 10:54 AM

## 2022-10-02 NOTE — Consult Note (Signed)
ANTICOAGULATION CONSULT NOTE  Pharmacy Consult for Heparin Infusion Indication: atrial fibrillation  Patient Measurements: Height: '6\' 2"'$  (188 cm) Weight: (!) 138.9 kg (306 lb 3.5 oz) IBW/kg (Calculated) : 82.2 Heparin Dosing Weight: 107.3 kg  Labs: Recent Labs    09/30/22 0413 09/30/22 1358 10/01/22 0136 10/01/22 1244 10/01/22 2157 10/02/22 0524  HGB 7.6*  --  8.1*  --   --  7.7*  HCT 24.2*  --  25.5*  --   --  24.4*  PLT 199  --  211  --   --  261  HEPARINUNFRC <0.10*   < > <0.10* 0.10* 0.18* 0.22*  CREATININE 4.61*  --  4.39*  --   --  3.07*   < > = values in this interval not displayed.     Estimated Creatinine Clearance: 35.1 mL/min (A) (by C-G formula based on SCr of 3.07 mg/dL (H)).   Medical History: Past Medical History:  Diagnosis Date   Adenocarcinoma of colon ([place]) 09/08/2022   Cholelithiasis    Chronic gastritis    Colon polyps    Coronary artery calcification seen on CT scan    Diastolic dysfunction    a.) TTE 04/09/2022: EF >55%, triv TR, G1DD   Diverticulosis    Hypertension    Long term current use of anticoagulant    a.) apixaban   Nephrolithiasis    Paroxysmal A-fib ([place])    a.) CHA2DS2VASc = 3 (age, HTN, T2DM);  b.) s/p DCCV 07/09/2016 (100 J x1), 12/22/2018 (120 J x1), 08/18/2021 (120 J x1), 03/11/2022 (120 J x1), 03/31/2022 (120 J x1); c.) rate/rhythm maintained on oral flecanide + metoprolol succinate; chronically anticoagulated with apixaban   T2DM (type 2 diabetes mellitus) ([place])    Assessment: Nicolas Zuniga is a 66 y.o. male presenting for laparoscopic R colectomy and cholecystectomy. PMH significant for recently diagnosed adenocarcinoma of the colon, DM, HTN, Afib on metoprolol, flecainide, and apixaban. Patient was on [facility] PTA per chart review, last dose of apixaban PTA reported to be on 10/16. Last enoxaparin dose 10/20 AM. Pharmacy has been consulted to initiate and manage heparin infusion.   Goal of Therapy:  Heparin level 0.3 - 0.5  units/ml (no bolus protocol) Monitor platelets by anticoagulation protocol: Yes   Plan: heparin level remains subtherapeutic but trend is up --Increase heparin rate to 2800 units/hr --Will check heparin level 6 hours after rate change --Daily CBC per protocol while on IV heparin  Thank you for allowing pharmacy to be a part of this patient's care.  Dallie Piles 10/02/2022 8:29 AM

## 2022-10-02 NOTE — Consult Note (Addendum)
ANTICOAGULATION CONSULT NOTE  Pharmacy Consult for Heparin Infusion Indication: atrial fibrillation  Patient Measurements: Height: '6\' 2"'$  (188 cm) Weight: (!) 138.9 kg (306 lb 3.5 oz) IBW/kg (Calculated) : 82.2 Heparin Dosing Weight: 107.3 kg  Labs: Recent Labs    09/30/22 0413 09/30/22 1358 10/01/22 0136 10/01/22 1244 10/02/22 0524 10/02/22 1341 10/02/22 2151  HGB 7.6*  --  8.1*  --  7.7*  --   --   HCT 24.2*  --  25.5*  --  24.4*  --   --   PLT 199  --  211  --  261  --   --   HEPARINUNFRC <0.10*   < > <0.10*   < > 0.22* 0.27* 0.30  CREATININE 4.61*  --  4.39*  --  3.07*  --   --    < > = values in this interval not displayed.     Estimated Creatinine Clearance: 35.1 mL/min (A) (by C-G formula based on SCr of 3.07 mg/dL (H)).   Medical History: Past Medical History:  Diagnosis Date   Adenocarcinoma of colon ([place]) 09/08/2022   Cholelithiasis    Chronic gastritis    Colon polyps    Coronary artery calcification seen on CT scan    Diastolic dysfunction    a.) TTE 04/09/2022: EF >55%, triv TR, G1DD   Diverticulosis    Hypertension    Long term current use of anticoagulant    a.) apixaban   Nephrolithiasis    Paroxysmal A-fib ([name])    a.) CHA2DS2VASc = 3 (age, HTN, T2DM);  b.) s/p DCCV 07/09/2016 (100 J x1), 12/22/2018 (120 J x1), 08/18/2021 (120 J x1), 03/11/2022 (120 J x1), 03/31/2022 (120 J x1); c.) rate/rhythm maintained on oral flecanide + metoprolol succinate; chronically anticoagulated with apixaban   T2DM (type 2 diabetes mellitus) ([place])    Assessment: Nicolas Zuniga is a 66 y.o. male presenting for laparoscopic R colectomy and cholecystectomy. PMH significant for recently diagnosed adenocarcinoma of the colon, DM, HTN, Afib on metoprolol, flecainide, and apixaban. Patient was on [facility] PTA per chart review, last dose of apixaban PTA reported to be on 10/16. Last enoxaparin dose 10/20 AM. Pharmacy has been consulted to initiate and manage heparin infusion.   Goal  of Therapy:  Heparin level 0.3 - 0.5 units/ml (no bolus protocol) Monitor platelets by anticoagulation protocol: Yes   Plan: heparin level 0.3; just reached therapeutic x 1, though trending up and for  narrow goal --Will continue heparin rate at 2800 units/hr --Will re-check heparin level in 6 hours  --Daily CBC per protocol while on IV heparin  Thank you for allowing pharmacy to be a part of this patient's care.  Wynelle Cleveland 10/02/2022 10:39 PM

## 2022-10-02 NOTE — Progress Notes (Signed)
PHARMACY - TOTAL PARENTERAL NUTRITION CONSULT NOTE   Indication: takeback yesterday for anastomosic leak, intra-abdominal infection, will likely have post-op ileus  Patient Measurements: Height: '6\' 2"'$  (188 cm) Weight: (!) 138.9 kg (306 lb 3.5 oz) IBW/kg (Calculated) : 82.2 TPN AdjBW (KG): 91.1 Body mass index is 39.32 kg/m.  Assessment: Pt with hx of recently diagnosed adenocarcinoma of the colon who is currently admitted status post right colectomy, diabetes, hypertension, and A-fib status post multiple ablations  Glucose / Insulin: glargine 20u/day + SSI q4h BG 179 - 284 33u SSI re Electrolytes: hyponatremic, hypophosphatemia Renal: SCR 1.93-->1.45-->4.61-->3.07 Hepatic: AST, ALT wnl Intake/Output last 2 shifts:  10/26 0701 - 10/27 0700 In: 2682 [P.O.:120; I.V.:2412; IV Piggyback:150] Out: 3620 [Urine:3275; Emesis/NG output:50; Drains:25; Stool:270]  MIVF: none GI Imaging:No new pertinent imaging studies GI Surgeries / Procedures: s/p exploratory laparotomy, abdominal washout, and diverting loop ileostomy 10/23  Central access: 09/24/22 TPN start date: 09/29/22  Nutritional Goals: Goal TPN rate is 90 mL/hr (provides 145g of protein and 1648 kcals per day)  RD Assessment: Estimated Needs Total Energy Estimated Needs: 2700-3000kcal/day Total Protein Estimated Needs: 135-150g/day Total Fluid Estimated Needs: 2.2-2.5L/day  Current Nutrition:  NPO  Plan:  --continue TPN at 90 mL/hr  --Nutritional components: Amino acids (using 15% Clinisol): 144.7 grams Dextrose: 360.7 grams  Lipids (using 20% SMOFlipids): 76 grams kCal: 2567 /24h --Electrolytes in TPN: Na 13mq/L, K 5734m/L, Ca 34m9mL, Mg 34mE80m, and Phos 20mm67m. Cl:Ac 1:1 --Continue rSSI q4h  --increase levemir to 15 units BID + add 3 units Novolog q4h --add thiamine 100 mg daily x 3 (day #2) --Monitor TPN labs on Mon/Thurs, daily until stable  RodneDallie Piles7/2023,7:08 AM

## 2022-10-02 NOTE — Consult Note (Signed)
ANTICOAGULATION CONSULT NOTE  Pharmacy Consult for Heparin Infusion Indication: atrial fibrillation  Patient Measurements: Height: '6\' 2"'$  (188 cm) Weight: (!) 138.9 kg (306 lb 3.5 oz) IBW/kg (Calculated) : 82.2 Heparin Dosing Weight: 107.3 kg  Labs: Recent Labs    09/30/22 0413 09/30/22 1358 10/01/22 0136 10/01/22 1244 10/01/22 2157 10/02/22 0524  HGB 7.6*  --  8.1*  --   --  7.7*  HCT 24.2*  --  25.5*  --   --  24.4*  PLT 199  --  211  --   --  261  HEPARINUNFRC <0.10*   < > <0.10* 0.10* 0.18* 0.22*  CREATININE 4.61*  --  4.39*  --   --  3.07*   < > = values in this interval not displayed.     Estimated Creatinine Clearance: 35.1 mL/min (A) (by C-G formula based on SCr of 3.07 mg/dL (H)).   Medical History: Past Medical History:  Diagnosis Date   Adenocarcinoma of colon ([place]) 09/08/2022   Cholelithiasis    Chronic gastritis    Colon polyps    Coronary artery calcification seen on CT scan    Diastolic dysfunction    a.) TTE 04/09/2022: EF >55%, triv TR, G1DD   Diverticulosis    Hypertension    Long term current use of anticoagulant    a.) apixaban   Nephrolithiasis    Paroxysmal A-fib ([place])    a.) CHA2DS2VASc = 3 (age, HTN, T2DM);  b.) s/p DCCV 07/09/2016 (100 J x1), 12/22/2018 (120 J x1), 08/18/2021 (120 J x1), 03/11/2022 (120 J x1), 03/31/2022 (120 J x1); c.) rate/rhythm maintained on oral flecanide + metoprolol succinate; chronically anticoagulated with apixaban   T2DM (type 2 diabetes mellitus) ([place])    Assessment: Nicolas Zuniga is a 66 y.o. male presenting for laparoscopic R colectomy and cholecystectomy. PMH significant for recently diagnosed adenocarcinoma of the colon, DM, HTN, Afib on metoprolol, flecainide, and apixaban. Patient was on [facility] PTA per chart review, last dose of apixaban PTA reported to be on 10/16. Last enoxaparin dose 10/20 AM. Pharmacy has been consulted to initiate and manage heparin infusion.   Baseline Labs: aPTT 44, PT 22.1, INR 2.0,  Hgb 9.3, Hct 31.1, Plt 208   Patient previously on heparin this admission. Noted patient required multiple titrations due to subtherapeutic levels, last rate with levels was 1950 units/hr with HL 0.13. Anticoagulation was subsequently discontinued for ExLap.  Goal of Therapy:  Heparin level 0.3 - 0.5 units/ml (no bolus protocol) Monitor platelets by anticoagulation protocol: Yes  1026 2157 HL 0.18, subthera; 2400 un/hr 1027 0524 HL 0.22, subthera; 2600 un/hr   Plan:  --Increase heparin rate to 2700 units/hr --Will check heparin level 6 hours after rate change --Daily CBC per protocol while on IV heparin  Thank you for allowing pharmacy to be a part of this patient's care.  Pearla Dubonnet 10/02/2022 6:36 AM

## 2022-10-03 ENCOUNTER — Inpatient Hospital Stay: Payer: Medicare Other

## 2022-10-03 DIAGNOSIS — Z9049 Acquired absence of other specified parts of digestive tract: Secondary | ICD-10-CM | POA: Diagnosis not present

## 2022-10-03 LAB — MAGNESIUM: Magnesium: 1.8 mg/dL (ref 1.7–2.4)

## 2022-10-03 LAB — CBC WITH DIFFERENTIAL/PLATELET
Abs Immature Granulocytes: 0.4 10*3/uL — ABNORMAL HIGH (ref 0.00–0.07)
Band Neutrophils: 9 %
Basophils Absolute: 0 10*3/uL (ref 0.0–0.1)
Basophils Relative: 0 %
Eosinophils Absolute: 0 10*3/uL (ref 0.0–0.5)
Eosinophils Relative: 0 %
HCT: 27.8 % — ABNORMAL LOW (ref 39.0–52.0)
Hemoglobin: 8.8 g/dL — ABNORMAL LOW (ref 13.0–17.0)
Lymphocytes Relative: 8 %
Lymphs Abs: 2.8 10*3/uL (ref 0.7–4.0)
MCH: 23.3 pg — ABNORMAL LOW (ref 26.0–34.0)
MCHC: 31.7 g/dL (ref 30.0–36.0)
MCV: 73.5 fL — ABNORMAL LOW (ref 80.0–100.0)
Monocytes Absolute: 2.5 10*3/uL — ABNORMAL HIGH (ref 0.1–1.0)
Monocytes Relative: 7 %
Myelocytes: 1 %
Neutro Abs: 29.8 10*3/uL — ABNORMAL HIGH (ref 1.7–7.7)
Neutrophils Relative %: 75 %
Platelets: 324 10*3/uL (ref 150–400)
RBC: 3.78 MIL/uL — ABNORMAL LOW (ref 4.22–5.81)
RDW: 15.7 % — ABNORMAL HIGH (ref 11.5–15.5)
WBC: 35.5 10*3/uL — ABNORMAL HIGH (ref 4.0–10.5)
nRBC: 0.4 % — ABNORMAL HIGH (ref 0.0–0.2)

## 2022-10-03 LAB — BASIC METABOLIC PANEL
Anion gap: 6 (ref 5–15)
BUN: 38 mg/dL — ABNORMAL HIGH (ref 8–23)
CO2: 25 mmol/L (ref 22–32)
Calcium: 8.4 mg/dL — ABNORMAL LOW (ref 8.9–10.3)
Chloride: 105 mmol/L (ref 98–111)
Creatinine, Ser: 2.23 mg/dL — ABNORMAL HIGH (ref 0.61–1.24)
GFR, Estimated: 32 mL/min — ABNORMAL LOW (ref >60)
Glucose, Bld: 283 mg/dL — ABNORMAL HIGH (ref 70–99)
Potassium: 4.2 mmol/L (ref 3.5–5.1)
Sodium: 136 mmol/L (ref 135–145)

## 2022-10-03 LAB — HEPARIN LEVEL (UNFRACTIONATED): Heparin Unfractionated: 0.34 IU/mL (ref 0.30–0.70)

## 2022-10-03 LAB — GLUCOSE, CAPILLARY
Glucose-Capillary: 218 mg/dL — ABNORMAL HIGH (ref 70–99)
Glucose-Capillary: 245 mg/dL — ABNORMAL HIGH (ref 70–99)
Glucose-Capillary: 256 mg/dL — ABNORMAL HIGH (ref 70–99)
Glucose-Capillary: 262 mg/dL — ABNORMAL HIGH (ref 70–99)
Glucose-Capillary: 281 mg/dL — ABNORMAL HIGH (ref 70–99)
Glucose-Capillary: 282 mg/dL — ABNORMAL HIGH (ref 70–99)
Glucose-Capillary: 291 mg/dL — ABNORMAL HIGH (ref 70–99)

## 2022-10-03 LAB — PROCALCITONIN: Procalcitonin: 0.88 ng/mL

## 2022-10-03 LAB — PHOSPHORUS: Phosphorus: 2.6 mg/dL (ref 2.5–4.6)

## 2022-10-03 LAB — C-REACTIVE PROTEIN: CRP: 23.3 mg/dL — ABNORMAL HIGH (ref <1.0)

## 2022-10-03 LAB — SEDIMENTATION RATE: Sed Rate: 89 mm/hr — ABNORMAL HIGH (ref 0–20)

## 2022-10-03 MED ORDER — IOHEXOL 9 MG/ML PO SOLN
500.0000 mL | ORAL | Status: AC
  Administered 2022-10-03 (×2): 500 mL via ORAL

## 2022-10-03 MED ORDER — ALPRAZOLAM 0.25 MG PO TABS
0.2500 mg | ORAL_TABLET | Freq: Once | ORAL | Status: AC
  Administered 2022-10-04: 0.25 mg via ORAL
  Filled 2022-10-03: qty 1

## 2022-10-03 MED ORDER — TRACE MINERALS CU-MN-SE-ZN 300-55-60-3000 MCG/ML IV SOLN
INTRAVENOUS | Status: AC
  Filled 2022-10-03: qty 964.8

## 2022-10-03 NOTE — Progress Notes (Signed)
PT Cancellation Note  Patient Details Name: Nicolas Zuniga MRN: [id number] DOB: 06/09/1956   Cancelled Treatment:     PT session held due to ongoing significant abdominal pain limiting pt's ability to tolerate mobility. CT of Abdomen has been ordered this date. Will continue PT next available date/time per POC.   Josie Dixon 10/03/2022, 1:44 PM

## 2022-10-03 NOTE — Progress Notes (Signed)
Occupational Therapy Treatment Patient Details Name: Nicolas Zuniga MRN: [id number] DOB: [birth year] Today's Date: 10/03/2022   History of present illness 66 y.o. male Post-Op s/p exploratory laparotomy, abdominal washout, and diverting loop ileostomy for intra-abdominal purulent fluid after initial open right colectomy with open cholecystectomy for right colon cancer and cholelithiasis on 10/19   OT comments  Nicolas Zuniga was seen for OT treatment on this date. Upon arrival to room pt semi-supine in bed, endorsing significant pain in his abdomen. Supportive spouse at bedside states MD was recently in the room and that pt will potentially require additional imaging this date. RN in room during session and notified of pt pain level, however, pt declines additional pain medication this date stating it makes him feel "off". Pt declines OOB/EOB activity this date 2/2 pain but is agreeable to attempting exercises with bed in chair position. He performs bilateral UE/LE exercises as described below. He requires min cueing for technique t/o session. Pt educated on importance of functional activity for recovery and return to PLOF. Pt and spouse both eager to increase mobility once pain is improved. Pt continues to require MAX A for LB ADL management. Pt  return verbalized understanding of instruction provided. Pt making good progress toward goals and  continues to benefit from skilled OT services to maximize return to PLOF and minimize risk of future falls, injury, caregiver burden, and readmission. Will continue to follow POC. Discharge recommendation remains appropriate.     Recommendations for follow up therapy are one component of a multi-disciplinary discharge planning process, led by the attending physician.  Recommendations may be updated based on patient status, additional functional criteria and insurance authorization.    Follow Up Recommendations  Acute inpatient rehab (3hours/day)    Assistance  Recommended at Discharge Frequent or constant Supervision/Assistance  Patient can return home with the following  Two people to help with walking and/or transfers;Two people to help with bathing/dressing/bathroom;Help with stairs or ramp for entrance   Equipment Recommendations  Other (comment)    Recommendations for Other Services      Precautions / Restrictions Precautions Precautions: Fall Restrictions Weight Bearing Restrictions: No       Mobility Bed Mobility Overal bed mobility: Needs Assistance             General bed mobility comments: deferred at pt request 2/2 increased abdominal pain.    Transfers Overall transfer level: Needs assistance                       Balance Overall balance assessment: Needs assistance                                         ADL either performed or assessed with clinical judgement   ADL Overall ADL's : Needs assistance/impaired                                       General ADL Comments: Significantly functionally limited by increased pain, generalized weakness, and decreased activity tolerance. Anticipate ongoing need for MAX A for LB ADL management in seated position, MIN A to SET UP assist for UB ADL management. Supportive spouse at bedside, eager to provide assist as needed. Pt declines OOB/EOB activity 2/2 increased abdominal pain. Will continue to assess functional performance at future  sessions.    Extremity/Trunk Assessment Upper Extremity Assessment Upper Extremity Assessment: Generalized weakness   Lower Extremity Assessment Lower Extremity Assessment: Generalized weakness        Vision Patient Visual Report: No change from baseline     Perception     Praxis      Cognition Arousal/Alertness: Awake/alert Behavior During Therapy: WFL for tasks assessed/performed Overall Cognitive Status: Within Functional Limits for tasks assessed                                           Exercises General Exercises - Upper Extremity Shoulder Horizontal ABduction: AROM, 10 reps, Both Shoulder Horizontal ADduction: AROM, 10 reps, Both Elbow Flexion: AROM, Both, 10 reps Elbow Extension: AROM, Both, 10 reps Low Level/ICU Exercises Ankle Circles/Pumps: AROM, Both, 15 reps Shoulder Flexion: AROM, Both, 10 reps Shoulder Press: AROM, Both, 10 reps Other Exercises Other Exercises: OT facilitated BUE ther-ex as described below with education on body mechanics, safety, and DC recs provided t/o session.    Shoulder Instructions       General Comments      Pertinent Vitals/ Pain       Pain Assessment Pain Assessment: 0-10 Pain Score: 7  Pain Location: abdomen with activity Pain Descriptors / Indicators: Discomfort, Operative site guarding Pain Intervention(s): Limited activity within patient's tolerance, Monitored during session, Repositioned (RN notified however pt declines pain medications at this time.)  Home Living                                          Prior Functioning/Environment              Frequency  Min 3X/week        Progress Toward Goals  OT Goals(current goals can now be found in the care plan section)  Progress towards OT goals: Progressing toward goals  Acute Rehab OT Goals Patient Stated Goal: to return to PLOF OT Goal Formulation: With patient/family Time For Goal Achievement: 10/14/22 Potential to Achieve Goals: Good  Plan Discharge plan remains appropriate;Frequency remains appropriate    Co-evaluation                 AM-PAC OT "6 Clicks" Daily Activity     Outcome Measure   Help from another person eating meals?: Total (NPO) Help from another person taking care of personal grooming?: A Little Help from another person toileting, which includes using toliet, bedpan, or urinal?: A Lot Help from another person bathing (including washing, rinsing, drying)?: A Lot Help from another  person to put on and taking off regular upper body clothing?: A Lot Help from another person to put on and taking off regular lower body clothing?: A Lot 6 Click Score: 12    End of Session    OT Visit Diagnosis: Other abnormalities of gait and mobility (R26.89);Muscle weakness (generalized) (M62.81)   Activity Tolerance Patient limited by fatigue;Patient limited by pain   Patient Left in bed;with call bell/phone within reach   Nurse Communication Mobility status        Time: 4854-6270 OT Time Calculation (min): 24 min  Charges: OT General Charges $OT Visit: 1 Visit OT Treatments $Self Care/Home Management : 8-22 mins $Therapeutic Exercise: 8-22 mins  Shara Blazing, M.S., OTR/L 10/03/22, 12:47 PM

## 2022-10-03 NOTE — Consult Note (Signed)
ANTICOAGULATION CONSULT NOTE  Pharmacy Consult for Heparin Infusion Indication: atrial fibrillation  Patient Measurements: Height: '6\' 2"'$  (188 cm) Weight: (!) 138.9 kg (306 lb 3.5 oz) IBW/kg (Calculated) : 82.2 Heparin Dosing Weight: 107.3 kg  Labs: Recent Labs    10/01/22 0136 10/01/22 1244 10/02/22 0524 10/02/22 1341 10/02/22 2151 10/03/22 0426  HGB 8.1*  --  7.7*  --   --   --   HCT 25.5*  --  24.4*  --   --   --   PLT 211  --  261  --   --   --   HEPARINUNFRC <0.10*   < > 0.22* 0.27* 0.30 0.34  CREATININE 4.39*  --  3.07*  --   --  2.23*   < > = values in this interval not displayed.     Estimated Creatinine Clearance: 48.3 mL/min (A) (by C-G formula based on SCr of 2.23 mg/dL (H)).   Medical History: Past Medical History:  Diagnosis Date   Adenocarcinoma of colon ([place]) 09/08/2022   Cholelithiasis    Chronic gastritis    Colon polyps    Coronary artery calcification seen on CT scan    Diastolic dysfunction    a.) TTE 04/09/2022: EF >55%, triv TR, G1DD   Diverticulosis    Hypertension    Long term current use of anticoagulant    a.) apixaban   Nephrolithiasis    Paroxysmal A-fib ([place])    a.) CHA2DS2VASc = 3 (age, HTN, T2DM);  b.) s/p DCCV 07/09/2016 (100 J x1), 12/22/2018 (120 J x1), 08/18/2021 (120 J x1), 03/11/2022 (120 J x1), 03/31/2022 (120 J x1); c.) rate/rhythm maintained on oral flecanide + metoprolol succinate; chronically anticoagulated with apixaban   T2DM (type 2 diabetes mellitus) ([place])    Assessment: Nicolas Zuniga is a 66 y.o. male presenting for laparoscopic R colectomy and cholecystectomy. PMH significant for recently diagnosed adenocarcinoma of the colon, DM, HTN, Afib on metoprolol, flecainide, and apixaban. Patient was on [facility] PTA per chart review, last dose of apixaban PTA reported to be on 10/16. Last enoxaparin dose 10/20 AM. Pharmacy has been consulted to initiate and manage heparin infusion.   Goal of Therapy:  Heparin level 0.3 - 0.5  units/ml (no bolus protocol) Monitor platelets by anticoagulation protocol: Yes   Plan: heparin level 0.34; therapeutic x 2 --Will continue heparin rate at 2800 units/hr --Will transition to daily heparin levels. Next HL tomorrow AM --Daily CBC per protocol while on IV heparin  Thank you for allowing pharmacy to be a part of this patient's care.  Wynelle Cleveland 10/03/2022 4:51 AM

## 2022-10-03 NOTE — Progress Notes (Signed)
SUBJECTIVE: Less short of breath no chest pain and no palpitation.   Vitals:   10/03/22 0005 10/03/22 0438 10/03/22 0725 10/03/22 1138  BP: 124/66 133/70 131/74 124/82  Pulse: 98 87 85 88  Resp: '16 16 19 18  '$ Temp: 98.3 F (36.8 C) 98.3 F (36.8 C) (!) 97.4 F (36.3 C) 97.9 F (36.6 C)  TempSrc: Oral Oral Oral   SpO2: 95% 94% 96% 97%  Weight:      Height:        Intake/Output Summary (Last 24 hours) at 10/03/2022 1155 Last data filed at 10/03/2022 1138 Gross per 24 hour  Intake 2260.84 ml  Output 4390 ml  Net -2129.16 ml    LABS: Basic Metabolic Panel: Recent Labs    10/02/22 0524 10/03/22 0426  NA 133* 136  K 3.8 4.2  CL 102 105  CO2 20* 25  GLUCOSE 284* 283*  BUN 48* 38*  CREATININE 3.07* 2.23*  CALCIUM 8.3* 8.4*  MG 2.4 1.8  PHOS 2.4* 2.6   Liver Function Tests: No results for input(s): "AST", "ALT", "ALKPHOS", "BILITOT", "PROT", "ALBUMIN" in the last 72 hours. No results for input(s): "LIPASE", "AMYLASE" in the last 72 hours. CBC: Recent Labs    10/02/22 0524 10/03/22 0426  WBC 29.5* 35.5*  NEUTROABS 22.8* 29.8*  HGB 7.7* 8.8*  HCT 24.4* 27.8*  MCV 73.9* 73.5*  PLT 261 324   Cardiac Enzymes: No results for input(s): "CKTOTAL", "CKMB", "CKMBINDEX", "TROPONINI" in the last 72 hours. BNP: Invalid input(s): "POCBNP" D-Dimer: No results for input(s): "DDIMER" in the last 72 hours. Hemoglobin A1C: No results for input(s): "HGBA1C" in the last 72 hours. Fasting Lipid Panel: No results for input(s): "CHOL", "HDL", "LDLCALC", "TRIG", "CHOLHDL", "LDLDIRECT" in the last 72 hours. Thyroid Function Tests: No results for input(s): "TSH", "T4TOTAL", "T3FREE", "THYROIDAB" in the last 72 hours.  Invalid input(s): "FREET3" Anemia Panel: No results for input(s): "VITAMINB12", "FOLATE", "FERRITIN", "TIBC", "IRON", "RETICCTPCT" in the last 72 hours.   PHYSICAL EXAM General: Well developed, well nourished, in no acute distress HEENT:  Normocephalic and  atramatic Neck:  No JVD.  Lungs: Clear bilaterally to auscultation and percussion. Heart: HRRR . Normal S1 and S2 without gallops or murmurs.  Abdomen: Bowel sounds are positive, abdomen soft and non-tender  Msk:  Back normal, normal gait. Normal strength and tone for age. Extremities: No clubbing, cyanosis or edema.   Neuro: Alert and oriented X 3. Psych:  Good affect, responds appropriately  TELEMETRY: Atrial fibrillation 85 bpm  ASSESSMENT AND PLAN: Persistent atrial fibrillation status post right colectomy still having some abdominal pain.  Ventricular rate of atrial fibrillation is stable and feels less short of breath.  Principal Problem:   S/P right colectomy Active Problems:   Diabetes ([place])   Essential hypertension   Paroxysmal Zuniga-fib (HCC)   S/P partial resection of colon   Hypotension   Hyponatremia   AKI (acute kidney injury) ([place])   Postoperative intra-abdominal abscess   Adenocarcinoma of colon (H[facility])   Persistent atrial fibrillation (H[facility])   Sepsis ([place])   Diabetic ketoacidosis without coma associated with type 2 diabetes mellitus (H[facility])    Nicolas Torrico A, MD, [facility] 10/03/2022 11:55 AM

## 2022-10-03 NOTE — Plan of Care (Signed)
Problem: Education: Goal: Ability to describe self-care measures that may prevent or decrease complications (Diabetes Survival Skills Education) will improve 10/03/2022 0226 by Brooke Bonito, RN Outcome: Progressing 10/03/2022 0226 by Brooke Bonito, RN Outcome: Progressing Goal: Individualized Educational Video(s) 10/03/2022 0226 by Brooke Bonito, RN Outcome: Progressing 10/03/2022 0226 by Brooke Bonito, RN Outcome: Progressing   Problem: Coping: Goal: Ability to adjust to condition or change in health will improve 10/03/2022 0226 by Brooke Bonito, RN Outcome: Progressing 10/03/2022 0226 by Brooke Bonito, RN Outcome: Progressing   Problem: Fluid Volume: Goal: Ability to maintain a balanced intake and output will improve 10/03/2022 0226 by Brooke Bonito, RN Outcome: Progressing 10/03/2022 0226 by Brooke Bonito, RN Outcome: Progressing   Problem: Health Behavior/Discharge Planning: Goal: Ability to identify and utilize available resources and services will improve 10/03/2022 0226 by Brooke Bonito, RN Outcome: Progressing 10/03/2022 0226 by Brooke Bonito, RN Outcome: Progressing Goal: Ability to manage health-related needs will improve 10/03/2022 0226 by Brooke Bonito, RN Outcome: Progressing 10/03/2022 0226 by Brooke Bonito, RN Outcome: Progressing   Problem: Metabolic: Goal: Ability to maintain appropriate glucose levels will improve 10/03/2022 0226 by Brooke Bonito, RN Outcome: Progressing 10/03/2022 0226 by Brooke Bonito, RN Outcome: Progressing   Problem: Nutritional: Goal: Maintenance of adequate nutrition will improve 10/03/2022 0226 by Brooke Bonito, RN Outcome: Progressing 10/03/2022 0226 by Brooke Bonito, RN Outcome: Progressing Goal: Progress toward achieving an optimal weight will improve 10/03/2022 0226 by Brooke Bonito, RN Outcome: Progressing 10/03/2022 0226 by Brooke Bonito, RN Outcome: Progressing   Problem: Tissue Perfusion: Goal: Adequacy of  tissue perfusion will improve 10/03/2022 0226 by Brooke Bonito, RN Outcome: Progressing 10/03/2022 0226 by Brooke Bonito, RN Outcome: Progressing   Problem: Education: Goal: Knowledge of General Education information will improve Description: Including pain rating scale, medication(s)/side effects and non-pharmacologic comfort measures 10/03/2022 0226 by Brooke Bonito, RN Outcome: Progressing 10/03/2022 0226 by Brooke Bonito, RN Outcome: Progressing   Problem: Health Behavior/Discharge Planning: Goal: Ability to manage health-related needs will improve 10/03/2022 0226 by Brooke Bonito, RN Outcome: Progressing 10/03/2022 0226 by Brooke Bonito, RN Outcome: Progressing   Problem: Clinical Measurements: Goal: Ability to maintain clinical measurements within normal limits will improve 10/03/2022 0226 by Brooke Bonito, RN Outcome: Progressing 10/03/2022 0226 by Brooke Bonito, RN Outcome: Progressing Goal: Will remain free from infection 10/03/2022 0226 by Brooke Bonito, RN Outcome: Progressing 10/03/2022 0226 by Brooke Bonito, RN Outcome: Progressing Goal: Diagnostic test results will improve 10/03/2022 0226 by Brooke Bonito, RN Outcome: Progressing 10/03/2022 0226 by Brooke Bonito, RN Outcome: Progressing Goal: Respiratory complications will improve 10/03/2022 0226 by Brooke Bonito, RN Outcome: Progressing 10/03/2022 0226 by Brooke Bonito, RN Outcome: Progressing Goal: Cardiovascular complication will be avoided 10/03/2022 0226 by Brooke Bonito, RN Outcome: Progressing 10/03/2022 0226 by Brooke Bonito, RN Outcome: Progressing   Problem: Activity: Goal: Risk for activity intolerance will decrease 10/03/2022 0226 by Brooke Bonito, RN Outcome: Progressing 10/03/2022 0226 by Brooke Bonito, RN Outcome: Progressing   Problem: Nutrition: Goal: Adequate nutrition will be maintained 10/03/2022 0226 by Brooke Bonito, RN Outcome: Progressing 10/03/2022 0226 by Brooke Bonito, RN Outcome: Progressing   Problem: Coping: Goal: Level of anxiety will decrease 10/03/2022 0226 by Brooke Bonito, RN Outcome: Progressing 10/03/2022 0226 by Brooke Bonito, RN Outcome: Progressing   Problem: Elimination: Goal: Will not experience complications related to bowel motility 10/03/2022 0226 by Brooke Bonito, RN Outcome: Progressing 10/03/2022 0226 by Brooke Bonito, RN Outcome: Progressing Goal: Will not experience complications related to urinary retention 10/03/2022 0226 by Brooke Bonito, RN  Outcome: Progressing 10/03/2022 0226 by Brooke Bonito, RN Outcome: Progressing   Problem: Pain Managment: Goal: General experience of comfort will improve 10/03/2022 0226 by Brooke Bonito, RN Outcome: Progressing 10/03/2022 0226 by Brooke Bonito, RN Outcome: Progressing   Problem: Safety: Goal: Ability to remain free from injury will improve 10/03/2022 0226 by Brooke Bonito, RN Outcome: Progressing 10/03/2022 0226 by Brooke Bonito, RN Outcome: Progressing   Problem: Skin Integrity: Goal: Risk for impaired skin integrity will decrease 10/03/2022 0226 by Brooke Bonito, RN Outcome: Progressing 10/03/2022 0226 by Brooke Bonito, RN Outcome: Progressing   Problem: Activity: Goal: Ability to tolerate increased activity will improve Outcome: Progressing   Problem: Cardiac: Goal: Ability to achieve and maintain adequate cardiopulmonary perfusion will improve Outcome: Progressing

## 2022-10-03 NOTE — Progress Notes (Signed)
PHARMACY - TOTAL PARENTERAL NUTRITION CONSULT NOTE   Indication: takeback yesterday for anastomosic leak, intra-abdominal infection, will likely have post-op ileus  Patient Measurements: Height: '6\' 2"'$  (188 cm) Weight: (!) 138.9 kg (306 lb 3.5 oz) IBW/kg (Calculated) : 82.2 TPN AdjBW (KG): 91.1 Body mass index is 39.32 kg/m.  Assessment: Pt with hx of recently diagnosed adenocarcinoma of the colon who is currently admitted status post right colectomy, diabetes, hypertension, and A-fib status post multiple ablations  Glucose / Insulin: glargine 30u/day + SSI q4h BG 262-283. Novolog 24 units/day.  Electrolytes: hyponatremic, hypophosphatemia Renal: SCR 1.93-->1.45-->4.61-->3.07 > 2.23 Hepatic: AST, ALT wnl Intake/Output last 2 shifts:  10/26 0701 - 10/27 0700 In: 2820 [P.O.:120; I.V.:2412; IV Piggyback:150] Out: 5929 [Urine:3275; Emesis/NG output:50; Drains:25; Stool:270]  MIVF: none GI Imaging:No new pertinent imaging studies GI Surgeries / Procedures: s/p exploratory laparotomy, abdominal washout, and diverting loop ileostomy 10/23  Central access: 09/24/22 TPN start date: 09/29/22  Nutritional Goals: Goal TPN rate is 90 mL/hr (provides 145g of protein and 1648 kcals per day)  RD Assessment: Estimated Needs Total Energy Estimated Needs: 2700-3000kcal/day Total Protein Estimated Needs: 135-150g/day Total Fluid Estimated Needs: 2.2-2.5L/day  Current Nutrition:  NPO  Plan:  --continue TPN at 90 mL/hr  --Nutritional components: Amino acids (using 15% Clinisol): 144.7 grams Dextrose: 360.7 grams  Lipids (using 20% SMOFlipids): 76 grams kCal: 2567 /24h --Electrolytes in TPN: Na 667mq/L, K 571m/L, Ca 67m46mL, Mg 67mE67m, and Phos 20mm62m. Cl:Ac 1:1 --Continue rSSI q4h  --increase levemir to 15 units BID + add 3 units Novolog q4h --add thiamine 100 mg daily x 3 (day #3) --Monitor TPN labs on Mon/Thurs, daily until stable  KishaOswald HillockrmD, BCPS 10/03/2022,9:41  AM

## 2022-10-03 NOTE — Progress Notes (Signed)
[place], [place] 10/03/22  Subjective:   Hospital day # 9   Wife at bedside. States he continues to have pain.  Alert and oriented Room air Remains on amiodarone  TPN at  90 cc/h. Foley catheter NGT removed He is wanting to sit up in bed   Renal: 10/27 0701 - 10/28 0700 In: 2820.3 [P.O.:180; I.V.:2513.1; IV Piggyback:127.2] Out: 4940 [Urine:4700; Drains:15; Stool:225] Lab Results  Component Value Date   CREATININE 2.23 (H) 10/03/2022   CREATININE 3.07 (H) 10/02/2022   CREATININE 4.39 (H) 10/01/2022     Objective:  Vital signs in last 24 hours:  Temp:  [97.4 F (36.3 C)-98.8 F (37.1 C)] 97.9 F (36.6 C) (10/28 1138) Pulse Rate:  [85-105] 88 (10/28 1138) Resp:  [16-32] 18 (10/28 1138) BP: (121-146)/(66-82) 124/82 (10/28 1138) SpO2:  [93 %-97 %] 97 % (10/28 1138)  Weight change:  Filed Weights   09/27/22 0148 09/30/22 0500 10/01/22 0427  Weight: 118 kg (!) 139.3 kg (!) 138.9 kg    Intake/Output:    Intake/Output Summary (Last 24 hours) at 10/03/2022 1230 Last data filed at 10/03/2022 1138 Gross per 24 hour  Intake 2101.45 ml  Output 4390 ml  Net -2288.55 ml      Physical Exam: General:  laying in the bed  HEENT Normocephalic  Pulm/lungs Normal breathing effort, Diminished   CVS/Heart Irregular   Abdomen:  ostomy in place  Extremities: + peripheral edema present  Neurologic: Alert, able to answer simple questions  Skin: No acute rashes          Basic Metabolic Panel:  Recent Labs  Lab 09/29/22 0106 09/29/22 1041 09/29/22 2042 09/30/22 0413 10/01/22 0136 10/02/22 0524 10/03/22 0426  NA 130*   < > 128* 131* 133* 133* 136  K 4.1   < > 3.6 3.4* 3.9 3.8 4.2  CL 94*   < > 97* 99 101 102 105  CO2 16*   < > 21* 22 22 20* 25  GLUCOSE 324*   < > 191* 150* 165* 284* 283*  BUN 29*   < > 44* 46* 52* 48* 38*  CREATININE 2.97*   < > 4.33* 4.61* 4.39* 3.07* 2.23*  CALCIUM 7.5*   < > 8.3* 8.4* 8.4* 8.3* 8.4*  MG  1.8  --   --  2.6* 2.6* 2.4 1.8  PHOS 3.1  --   --  1.6* 2.1* 2.4* 2.6   < > = values in this interval not displayed.      CBC: Recent Labs  Lab 09/29/22 0106 09/30/22 0413 10/01/22 0136 10/02/22 0524 10/03/22 0426  WBC 13.4* 14.7* 17.7* 29.5* 35.5*  NEUTROABS 9.0* 10.6* 13.2* 22.8* 29.8*  HGB 7.6* 7.6* 8.1* 7.7* 8.8*  HCT 25.4* 24.2* 25.5* 24.4* 27.8*  MCV 78.9* 74.5* 74.6* 73.9* 73.5*  PLT 254 199 211 261 324      No results found for: "HEPBSAG", "HEPBSAB", "HEPBIGM"    Microbiology:  Recent Results (from the past 240 hour(s))  MRSA Next Gen by PCR, Nasal     Status: None   Collection Time: 09/27/22  1:53 AM   Specimen: Nasal Mucosa; Nasal Swab  Result Value Ref Range Status   MRSA by PCR Next Gen NOT DETECTED NOT DETECTED Final    Comment: (NOTE) The GeneXpert MRSA Assay (FDA approved for NASAL specimens only), is one component of a comprehensive MRSA colonization surveillance program. It is not intended to diagnose MRSA infection nor to guide or monitor treatment  for MRSA infections. Test performance is not FDA approved in patients less than 81 years old. Performed at [facility], [address] [place], [place] [address]   Culture, Respiratory w Gram Stain     Status: None   Collection Time: 09/29/22  8:23 AM   Specimen: Tracheal Aspirate; Respiratory  Result Value Ref Range Status   Specimen Description   Final    TRACHEAL ASPIRATE Performed at [facility], [address], [place], [place] [address]    Special Requests   Final    NONE Performed at [facility], [address] [place], [place] [address]    Gram Stain   Final    RARE WBC PRESENT, PREDOMINANTLY PMN RARE BUDDING YEAST SEEN Performed at [facility], [place] [address] [place], [place] [address]    Culture   Final    MODERATE SERRATIA MARCESCENS FEW CANDIDA ALBICANS    Report Status 10/01/2022 FINAL  Final   Organism ID, Bacteria SERRATIA  MARCESCENS  Final      Susceptibility   Serratia marcescens - MIC*    CEFAZOLIN >=64 RESISTANT Resistant     CEFEPIME <=0.12 SENSITIVE Sensitive     CEFTAZIDIME <=1 SENSITIVE Sensitive     CEFTRIAXONE 1 SENSITIVE Sensitive     CIPROFLOXACIN <=0.25 SENSITIVE Sensitive     GENTAMICIN <=1 SENSITIVE Sensitive     TRIMETH/SULFA <=20 SENSITIVE Sensitive     * MODERATE SERRATIA MARCESCENS    Coagulation Studies: No results for input(s): "LABPROT", "INR" in the last 72 hours.   Urinalysis: No results for input(s): "COLORURINE", "LABSPEC", "PHURINE", "GLUCOSEU", "HGBUR", "BILIRUBINUR", "KETONESUR", "PROTEINUR", "UROBILINOGEN", "NITRITE", "LEUKOCYTESUR" in the last 72 hours.  Invalid input(s): "APPERANCEUR"    Imaging: No results found.   Medications:    heparin 2,800 Units/hr (10/03/22 0525)   piperacillin-tazobactam (ZOSYN)  IV 3.375 g (10/03/22 0800)   TPN ADULT (ION) 90 mL/hr at 10/03/22 0400   TPN ADULT (ION)      amiodarone  200 mg Oral BID   Followed by   Derrill Memo ON 10/04/2022] amiodarone  200 mg Oral Daily   Chlorhexidine Gluconate Cloth  6 each Topical Daily   insulin aspart  0-20 Units Subcutaneous Q4H   insulin aspart  3 Units Subcutaneous Q4H   insulin detemir  15 Units Subcutaneous BID   metoprolol tartrate  25 mg Oral BID   pantoprazole (PROTONIX) IV  40 mg Intravenous QHS   sodium chloride flush  10-40 mL Intracatheter Q12H   dextrose, morphine injection, ondansetron **OR** ondansetron (ZOFRAN) IV, mouth rinse, oxyCODONE, prochlorperazine **OR** prochlorperazine, sodium chloride flush  Assessment/ Plan:  66 y.o. male with  medical problems of atrial fibrillation, history of cardioversion, colon mass, hyperlipidemia, type 2 diabetes admitted on 09/24/2022 for S/P right colectomy [Z90.49] S/P partial resection of colon [Z90.49]  1.  Acute kidney injury Baseline creatinine 1.06 from 09/14/2022.  Cr 2.2    AKI likely secondary to ATN, oligoanuric Renal function  stable and adequate UOP recorded. No need for dialysis at this time. Expect renal function to continue to slowly improve. Avoid nephrotoxic agents and therapies, also avoid hypotension.   Was given a dose of lasix yesterday- produced 4700 mL   2.  Acute respiratory failure Room air    LOS: [place] 10/28/202312:30 PM  [facility] [place], [place]

## 2022-10-03 NOTE — Plan of Care (Signed)
Problem: Education: Goal: Ability to describe self-care measures that may prevent or decrease complications (Diabetes Survival Skills Education) will improve Outcome: Progressing Goal: Individualized Educational Video(s) Outcome: Progressing   Problem: Coping: Goal: Ability to adjust to condition or change in health will improve Outcome: Progressing   Problem: Fluid Volume: Goal: Ability to maintain a balanced intake and output will improve Outcome: Progressing   Problem: Health Behavior/Discharge Planning: Goal: Ability to identify and utilize available resources and services will improve Outcome: Progressing Goal: Ability to manage health-related needs will improve Outcome: Progressing   Problem: Metabolic: Goal: Ability to maintain appropriate glucose levels will improve Outcome: Progressing   Problem: Nutritional: Goal: Maintenance of adequate nutrition will improve Outcome: Progressing Goal: Progress toward achieving an optimal weight will improve Outcome: Progressing   Problem: Skin Integrity: Goal: Risk for impaired skin integrity will decrease Outcome: Progressing   Problem: Tissue Perfusion: Goal: Adequacy of tissue perfusion will improve Outcome: Progressing   Problem: Education: Goal: Knowledge of General Education information will improve Description: Including pain rating scale, medication(s)/side effects and non-pharmacologic comfort measures Outcome: Progressing   Problem: Health Behavior/Discharge Planning: Goal: Ability to manage health-related needs will improve Outcome: Progressing   Problem: Clinical Measurements: Goal: Ability to maintain clinical measurements within normal limits will improve Outcome: Progressing Goal: Will remain free from infection Outcome: Progressing Goal: Diagnostic test results will improve Outcome: Progressing Goal: Respiratory complications will improve Outcome: Progressing Goal: Cardiovascular complication will  be avoided Outcome: Progressing   Problem: Activity: Goal: Risk for activity intolerance will decrease Outcome: Progressing   Problem: Nutrition: Goal: Adequate nutrition will be maintained Outcome: Progressing   Problem: Coping: Goal: Level of anxiety will decrease Outcome: Progressing   Problem: Elimination: Goal: Will not experience complications related to bowel motility Outcome: Progressing Goal: Will not experience complications related to urinary retention Outcome: Progressing   Problem: Pain Managment: Goal: General experience of comfort will improve Outcome: Progressing   Problem: Safety: Goal: Ability to remain free from injury will improve Outcome: Progressing   Problem: Skin Integrity: Goal: Risk for impaired skin integrity will decrease Outcome: Progressing   Problem: Education: Goal: Knowledge of disease or condition will improve Outcome: Progressing Goal: Understanding of medication regimen will improve Outcome: Progressing Goal: Individualized Educational Video(s) Outcome: Progressing   Problem: Activity: Goal: Ability to tolerate increased activity will improve Outcome: Progressing   Problem: Cardiac: Goal: Ability to achieve and maintain adequate cardiopulmonary perfusion will improve Outcome: Progressing   Problem: Health Behavior/Discharge Planning: Goal: Ability to safely manage health-related needs after discharge will improve Outcome: Progressing   Problem: Education: Goal: Ability to describe self-care measures that may prevent or decrease complications (Diabetes Survival Skills Education) will improve Outcome: Progressing Goal: Individualized Educational Video(s) Outcome: Progressing   Problem: Cardiac: Goal: Ability to maintain an adequate cardiac output will improve Outcome: Progressing   Problem: Health Behavior/Discharge Planning: Goal: Ability to identify and utilize available resources and services will improve Outcome:  Progressing Goal: Ability to manage health-related needs will improve Outcome: Progressing   Problem: Fluid Volume: Goal: Ability to achieve a balanced intake and output will improve Outcome: Progressing   Problem: Metabolic: Goal: Ability to maintain appropriate glucose levels will improve Outcome: Progressing   Problem: Nutritional: Goal: Maintenance of adequate nutrition will improve Outcome: Progressing Goal: Maintenance of adequate weight for body size and type will improve Outcome: Progressing   Problem: Respiratory: Goal: Will regain and/or maintain adequate ventilation Outcome: Progressing   Problem: Urinary Elimination: Goal: Ability to achieve and maintain  adequate renal perfusion and functioning will improve Outcome: Progressing

## 2022-10-03 NOTE — Progress Notes (Signed)
[place] Hospital Day(s): 9.   Post op day(s): 5 Days Post-Op.   Interval History:  Patient seen and examined No acute events or new complaints overnight.  Patient reports he is smoldering... no progress, no appetite.  No fever, chills, nausea, emesis  Leukocytosis continues to climb; 35.5K Hgb stable up to 8.8 Renal function making improvements; sCr - 2.23; UO - 4700 ccs. Nephrology on board   NGT removed (10/26) Surgical drain; 15 ccs; opaque serous/purulent, but minimal.  Output from diverting loop ileostomy; thin, bilious   Vital signs in last 24 hours: [min-max] current  Temp:  [97.4 F (36.3 C)-98.8 F (37.1 C)] 97.9 F (36.6 C) (10/28 1138) Pulse Rate:  [85-105] 88 (10/28 1138) Resp:  [16-32] 18 (10/28 1138) BP: (121-146)/(66-82) 124/82 (10/28 1138) SpO2:  [93 %-97 %] 97 % (10/28 1138)     Height: '6\' 2"'$  (188 cm) Weight: (!) 138.9 kg BMI (Calculated): 39.3   Intake/Output last 2 shifts:  10/27 0701 - 10/28 0700 In: 2820.3 [P.O.:180; I.V.:2513.1; IV Piggyback:127.2] Out: 4940 [Urine:4700; Drains:15; Stool:225]   Physical Exam:  Constitutional: Alert; awake, NAD Respiratory: No respiratory distress; on [place] Cardiovascular: Atrial fibrillation; 90-100 bpm Gastrointestinal: Soft, he does not appear tender, non-distended, no rebound/guarding. Surgical drain with serosanguinous output. Diverting loop colostomy in right abdomen; bowel sweat in bag; no significant stool  Genitourinary: Foley in place; urine clear  Integumentary: Midline laparotomy sub-q open to fascia, fascia intact, no erythema, soupy discharge.  I irrigated and cleansed wound and redressed with [place], NSS wet to moist.   Labs:     Latest Ref Rng & Units 10/03/2022    4:26 AM 10/02/2022    5:24 AM 10/01/2022    1:36 AM  CBC  WBC 4.0 - 10.5 K/uL 35.5  29.5  17.7   Hemoglobin 13.0 - 17.0 g/dL 8.8  7.7  8.1   Hematocrit 39.0 - 52.0 % 27.8  24.4  25.5    Platelets 150 - 400 K/uL 324  261  211       Latest Ref Rng & Units 10/03/2022    4:26 AM 10/02/2022    5:24 AM 10/01/2022    1:36 AM  CMP  Glucose 70 - 99 mg/dL 283  284  165   BUN 8 - 23 mg/dL 38  48  52   Creatinine 0.61 - 1.24 mg/dL 2.23  3.07  4.39   Sodium 135 - 145 mmol/L 136  133  133   Potassium 3.5 - 5.1 mmol/L 4.2  3.8  3.9   Chloride 98 - 111 mmol/L 105  102  101   CO2 22 - 32 mmol/L '25  20  22   '$ Calcium 8.9 - 10.3 mg/dL 8.4  8.3  8.4      Imaging studies: No new pertinent imaging studies   Assessment/Plan: 66 y.o. male 5 Days Post-Op s/p exploratory laparotomy, abdominal washout, and diverting loop ileostomy for intra-abdominal purulent fluid after initial open right colectomy with open cholecystectomy for right colon cancer and cholelithiasis on 10/19   - repeat CT Abdomen/Pelvis today, to ensure no intra-abdominal source   - Stay at [place] + Nutritional supplementation   - Continue TPN; at goal. Once tolerating FLD we can consider weaning this   - Continue IV Abx (Zosyn)  - Continue foley catheter for UO monitoring; sCr improved slightly, making urine; nephrology following  - Monitor abdominal examination; on-going ileostomy function  - Midline wound care: Pack daily  with saline moistened gauze, cover, secure  - Continue surgical drain; monitor and record output  - Pain control prn; antiemetics prn  - Okay to transfer from step down   - Appreciate cardiology assistance as well   All of the above findings and recommendations were discussed with the patient, and the medical team.  Ronny Bacon, M.D., [facility]  10/03/2022 ; 12:42 PM

## 2022-10-03 NOTE — Progress Notes (Signed)
[facility] Progress Note  Patient: Nicolas Zuniga    WPY:[id number]  DOA: 09/24/2022     Date of Service: the patient was seen and examined on 10/03/2022  No chief complaint on file.  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  Surgical findings were right colon mass with evidence of bulky mesenteric nodal involvement; large right colon mass adhered to the retroperitoneum; mesenteric nodes within terminal ileum; tension free anastomosis, no evidence of intraop leak and good perfusion; and chronic calculus cholecystitis.  Postop pt admitted to the medsurg unit per general surgery for additional workup and treatment.  Patient was admitted under general surgery team.   09/24/2022 - Underwent a laparoscopic right colectomy that was converted to open right colectomy along with a cholecystectomy - no significant complications and EBL was minimal at 80 cc.   10/20: Seen early morning by Dr. Dahlia Byes (general surgery) and was doing well.  Starting around 6 PM he began to have hypotension 70/41 and the hospitalist service was consulted. Pt was given total of 25 g of albumin, 2 L of normal saline, and started on glucagon gtt concern for beta blocker toxicity. His metoprolol and flecainide were held. BP improved. Mild AKI on labs Cr 1.93 down form admission 1.23, likely hypovolemia. D/c glucagon. Strict I&O. Continue hold beta blocker and flecainide, may reduce dose metoprolol on discharge.  10/21: BP soft but improved 115/53, 117/64. Cr improved 1.66. Hgb stable.  Rapid response overnight, A-fib RVR.  Transferred to stepdown, amiodarone infusion, minimal response, diltiazem infusion.   Dr. Humphrey Rolls, cardiology, aware. Dr Dahlia Byes ordering CT chest/abdomen/pelvis, starting heparin drip. CT no PE, (+) Small bowel leak, certainly no contrast extravasation and seems contained --> plan repeat CT, hold off on surgical intervention unless emergent, starting abx.  10/23: Patient went for repeat CT noting increased extravasation concerning for anastomotic leak.  Patient taken back to the OR and well no leak found, underwent bowel follow-through and washout.  Postop, patient in ICU and still intubated.  ABG notes severe respiratory acidosis.  Note that should patient remain on ventilator, will have critical care assumed medical management in conjunction with surgery. 10/24: Pt with worsening acute renal failure Nephrology consulted.  Pt with bradycardia overnight cardizem and precedex gtts discontinued.  Pt remains on amiodarone gtt at 30 mg/hr  Pt alert and following commands plans for possible SBT today ~ EXTUBATED 10/25: Tolerating extubation from respiratory standpoint. Off Vasopressors. Slight worsening of AKI, starting to make urine.  DKA resolved, weaning off insulin gtt to SQ insulin.  Atrial fibrillation rate controlled on Amiodarone.   Assessment and Plan: Acute respiratory failure secondary to severe metabolic acidosis  Mechanical intubation intraoperatively for airway protection ~ EXTUBATED 10/24 -Treatment of metabolic disorders as outlined below -Supplemental O2 as needed to maintain O2 sats >92% -Follow intermittent Chest X-ray & ABG as needed -Bronchodilators & Pulmicort nebs -Aggressive Pulmonary toilet as able   Atrial fibrillation with rvr~improving   Hypotension secondary to hypovolemia and septic shock ~ RESOLVED Mildly elevated Troponin due to demand ischemia Hx: HTN and Chronic diastolic dysfunction  ECHOCARDIOGRAM 09/29/22: LVEF 05-39%, normal diastolic function, RV function normal -Continuous cardiac monitoring -Maintain MAP >65 -IV fluids -Vasopressors as needed to  maintain MAP  goal ~ weaned off -Trend lactic acid until normalized (5.7 ~ 4.4. ~ 2.7 ~) -HS Troponin peaked at 109 -Cardiology following, appreciate input -s/p Amiodarone infusion, discontinued on 10/27, started amiodarone 200 mg p.o. twice daily for 4 doses followed by 200 daily  Continue heparin infusion -BP improved, consider re-implementing outpatient BB, will defer to Cardiology    Acute kidney injury secondary ATN~improving Hyponatremia ~ IMPROVING Anion gap metabolic acidosis ~ RESOLVED Mild Hyponatremia -Monitor I&O's / urinary output -Follow BMP -Ensure adequate renal perfusion -Avoid nephrotoxic agents as able -Replace electrolytes as indicated -Pharmacy following for assistance with electrolyte replacement  -Nephrology following, appreciate input Creatinine 1.86--4.6--4.39--3.7 --2.23 gradually improving   Right colon cancer and cholelithiasis s/p laparoscopic right colectomy and cholecystectomy: 09/24/22 Intra-abdominal infection s/p exploratory laparotomy with abdominal washout and diverting loop ileostomy 10/23 -Monitor fever curve -Trend WBC's & Procalcitonin -Follow cultures as above -Continue empiric Zosyn pending cultures & sensitivities -General Surgery primary service, appreciate input  -s/p NG LIS 10/26, NG tube was clamped and output is very minimal, general surgery recommended to start clear liquid diet and NG tube was removed.   10/28 WBC count 35.5 significant elevated, discussed with general surgery, we will plan to do CT scan of abdomen and pelvis to rule out fluid collection or abscess. ESR 89, Pro-Cal 0.88,  CRP pending    Type II diabetes mellitus  DKA -CBG's q4h; Target range of 140 to 180 Continue Levemir 20 units subcu daily -SSI (DKA resolved, in process of transitioning to SQ insulin) -Follow Hypo/Hyperglycemia protocol -Diabetes coordinator following, appreciate input    Body mass index is 39.32 kg/m.  Nutrition Problem: Increased  nutrient needs Etiology: post-op healing Interventions: Interventions: Refer to RD note for recommendations  Pressure Injury 09/30/22 Nare Left Deep Tissue Pressure Injury - Purple or maroon localized area of discolored intact skin or blood-filled blister due to damage of underlying soft tissue from pressure and/or shear. nonblanchable, red/purple bruise at lef (Active)  09/30/22 1100  Location: Nare  Location Orientation: Left  Staging: Deep Tissue Pressure Injury - Purple or maroon localized area of discolored intact skin or blood-filled blister due to damage of underlying soft tissue from pressure and/or shear.  Wound Description (Comments): nonblanchable, red/purple bruise at left nare  Present on Admission:   Dressing Type None 10/03/22 0400     Diet: CLD DVT Prophylaxis: Therapeutic Anticoagulation with heparin IV infusion    Advance goals of care discussion: Full code  Family Communication: family was present at bedside, at the time of interview.  The pt provided permission to discuss medical plan with the family. Opportunity was given to ask question and all questions were answered satisfactorily.   Disposition:  Pt is from Home, admitted with colon cancer, s/p surgical resection, developed AKI, A-fib with RVR still has NG tube, started clear liquid diet, which precludes a safe discharge. Discharge to Home with HH/PT vs SNF TBD, when clinically stable.  Subjective: No significant events overnight.  In the morning time patient was complaining of some abdominal discomfort, feeling better now.  Still has generalized weakness unable to participate with physical therapy due to generalized weakness. Tolerating diet well for now.  Denied any chest pain or palpitations.   Physical Exam: General:  alert oriented to time, place, and person.  Appear in mild distress, affect appropriate Eyes: PERRLA ENT: Oral Mucosa Clear, moist  Neck: no JVD,  Cardiovascular: Irregular rhythm, no  Murmur,  Respiratory: good respiratory effort, Bilateral Air entry equal and Decreased,  mild Crackles, no wheezes Abdomen: Bowel Sound present, Soft and moderate generalized tenderness,  Skin: no rashes Extremities: mild 1-2+ Pedal edema, no calf tenderness Neurologic: without any new focal findings Gait not checked due to patient safety concerns  Vitals:   10/03/22 0005 10/03/22 0438 10/03/22 0725 10/03/22 1138  BP: 124/66 133/70 131/74 124/82  Pulse: 98 87 85 88  Resp: _0 Temp: 98.3 F (36.8 C) 98.3 F (36.8 C) (!) 97.4 F (36.3 C) 97.9 F (36.6 C)  TempSrc: Oral Oral Oral   SpO2: 95% 94% 96% 97%  Weight:      Height:        Intake/Output Summary (Last 24 hours) at 10/03/2022 1248 Last data filed at 10/03/2022 1138 Gross per 24 hour  Intake 2101.45 ml  Output 4390 ml  Net -2288.55 ml   Filed Weights   09/27/22 0148 09/30/22 0500 10/01/22 0427  Weight: 118 kg (!) 139.3 kg (!) 138.9 kg    Data Reviewed: I have personally reviewed and interpreted daily labs, tele strips, imagings as discussed above. I reviewed all nursing notes, pharmacy notes, vitals, pertinent old records I have discussed plan of care as described above with RN and patient/family.  CBC: Recent Labs  Lab 09/29/22 0106 09/30/22 0413 10/01/22 0136 10/02/22 0524 10/03/22 0426  WBC 13.4* 14.7* 17.7* 29.5* 35.5*  NEUTROABS 9.0* 10.6* 13.2* 22.8* 29.8*  HGB 7.6* 7.6* 8.1* 7.7* 8.8*  HCT 25.4* 24.2* 25.5* 24.4* 27.8*  MCV 78.9* 74.5* 74.6* 73.9* 73.5*  PLT 254 199 211 261 989   Basic Metabolic Panel: Recent Labs  Lab 09/29/22 0106 09/29/22 1041 09/29/22 2042 09/30/22 0413 10/01/22 0136 10/02/22 0524 10/03/22 0426  NA 130*   < > 128* 131* 133* 133* 136  K 4.1   < > 3.6 3.4* 3.9 3.8 4.2  CL 94*   < > 97* 99 101 102 105  CO2 16*   < > 21* 22 22 20* 25  GLUCOSE 324*   < > 191* 150* 165* 284* 283*  BUN 29*   < > 44* 46* 52* 48* 38*  CREATININE 2.97*   < > 4.33* 4.61* 4.39* 3.07*  2.23*  CALCIUM 7.5*   < > 8.3* 8.4* 8.4* 8.3* 8.4*  MG 1.8  --   --  2.6* 2.6* 2.4 1.8  PHOS 3.1  --   --  1.6* 2.1* 2.4* 2.6   < > = values in this interval not displayed.    Studies: No results found.  Scheduled Meds:  amiodarone  200 mg Oral BID   Followed by   Derrill Memo ON 10/04/2022] amiodarone  200 mg Oral Daily   Chlorhexidine Gluconate Cloth  6 each Topical Daily   insulin aspart  0-20 Units Subcutaneous Q4H   insulin aspart  3 Units Subcutaneous Q4H   insulin detemir  15 Units Subcutaneous BID   metoprolol tartrate  25 mg Oral BID   pantoprazole (PROTONIX) IV  40 mg Intravenous QHS   sodium chloride flush  10-40 mL Intracatheter Q12H   Continuous Infusions:  heparin 2,800 Units/hr (10/03/22 0525)   piperacillin-tazobactam (ZOSYN)  IV 3.375 g (10/03/22 0800)   TPN ADULT (ION) 90 mL/hr at 10/03/22 0400   TPN ADULT (ION)     PRN Meds: dextrose, morphine injection, ondansetron **OR** ondansetron (ZOFRAN) IV, mouth rinse, oxyCODONE, prochlorperazine **OR** prochlorperazine, sodium chloride flush  Time spent: 35 minutes  Author: Val Riles. MD [facility] 10/03/2022 12:48 PM  To reach On-call,  see care teams to locate the attending and reach out to them via [email or web address]. If 7PM-7AM, please contact night-coverage If you still have difficulty reaching the attending provider, please page the [facility] (Director on Call) for [facility] on amion for assistance.

## 2022-10-04 DIAGNOSIS — Z9049 Acquired absence of other specified parts of digestive tract: Secondary | ICD-10-CM | POA: Diagnosis not present

## 2022-10-04 DIAGNOSIS — F411 Generalized anxiety disorder: Secondary | ICD-10-CM | POA: Diagnosis present

## 2022-10-04 LAB — CBC
HCT: 26 % — ABNORMAL LOW (ref 39.0–52.0)
Hemoglobin: 8 g/dL — ABNORMAL LOW (ref 13.0–17.0)
MCH: 23.4 pg — ABNORMAL LOW (ref 26.0–34.0)
MCHC: 30.8 g/dL (ref 30.0–36.0)
MCV: 76 fL — ABNORMAL LOW (ref 80.0–100.0)
Platelets: 388 10*3/uL (ref 150–400)
RBC: 3.42 MIL/uL — ABNORMAL LOW (ref 4.22–5.81)
RDW: 15.9 % — ABNORMAL HIGH (ref 11.5–15.5)
WBC: 34.7 10*3/uL — ABNORMAL HIGH (ref 4.0–10.5)
nRBC: 0.8 % — ABNORMAL HIGH (ref 0.0–0.2)

## 2022-10-04 LAB — CBC WITH DIFFERENTIAL/PLATELET
Abs Immature Granulocytes: 2.85 10*3/uL — ABNORMAL HIGH (ref 0.00–0.07)
Basophils Absolute: 0.2 10*3/uL — ABNORMAL HIGH (ref 0.0–0.1)
Basophils Relative: 1 %
Eosinophils Absolute: 0.2 10*3/uL (ref 0.0–0.5)
Eosinophils Relative: 1 %
HCT: 24.1 % — ABNORMAL LOW (ref 39.0–52.0)
Hemoglobin: 7.8 g/dL — ABNORMAL LOW (ref 13.0–17.0)
Immature Granulocytes: 8 %
Lymphocytes Relative: 9 %
Lymphs Abs: 3.5 10*3/uL (ref 0.7–4.0)
MCH: 23.9 pg — ABNORMAL LOW (ref 26.0–34.0)
MCHC: 32.4 g/dL (ref 30.0–36.0)
MCV: 73.9 fL — ABNORMAL LOW (ref 80.0–100.0)
Monocytes Absolute: 2.9 10*3/uL — ABNORMAL HIGH (ref 0.1–1.0)
Monocytes Relative: 8 %
Neutro Abs: 27.2 10*3/uL — ABNORMAL HIGH (ref 1.7–7.7)
Neutrophils Relative %: 73 %
Platelets: 367 10*3/uL (ref 150–400)
RBC: 3.26 MIL/uL — ABNORMAL LOW (ref 4.22–5.81)
RDW: 15.9 % — ABNORMAL HIGH (ref 11.5–15.5)
Smear Review: NORMAL
WBC: 36.9 10*3/uL — ABNORMAL HIGH (ref 4.0–10.5)
nRBC: 0.4 % — ABNORMAL HIGH (ref 0.0–0.2)

## 2022-10-04 LAB — PHOSPHORUS: Phosphorus: 3.1 mg/dL (ref 2.5–4.6)

## 2022-10-04 LAB — BASIC METABOLIC PANEL
Anion gap: 9 (ref 5–15)
BUN: 33 mg/dL — ABNORMAL HIGH (ref 8–23)
CO2: 23 mmol/L (ref 22–32)
Calcium: 8.4 mg/dL — ABNORMAL LOW (ref 8.9–10.3)
Chloride: 102 mmol/L (ref 98–111)
Creatinine, Ser: 1.67 mg/dL — ABNORMAL HIGH (ref 0.61–1.24)
GFR, Estimated: 45 mL/min — ABNORMAL LOW (ref >60)
Glucose, Bld: 313 mg/dL — ABNORMAL HIGH (ref 70–99)
Potassium: 4.1 mmol/L (ref 3.5–5.1)
Sodium: 134 mmol/L — ABNORMAL LOW (ref 135–145)

## 2022-10-04 LAB — HEPARIN LEVEL (UNFRACTIONATED)
Heparin Unfractionated: 0.19 IU/mL — ABNORMAL LOW (ref 0.30–0.70)
Heparin Unfractionated: 0.28 IU/mL — ABNORMAL LOW (ref 0.30–0.70)
Heparin Unfractionated: 0.27 IU/mL — ABNORMAL LOW (ref 0.30–0.70)

## 2022-10-04 LAB — GLUCOSE, CAPILLARY
Glucose-Capillary: 239 mg/dL — ABNORMAL HIGH (ref 70–99)
Glucose-Capillary: 267 mg/dL — ABNORMAL HIGH (ref 70–99)
Glucose-Capillary: 267 mg/dL — ABNORMAL HIGH (ref 70–99)
Glucose-Capillary: 268 mg/dL — ABNORMAL HIGH (ref 70–99)
Glucose-Capillary: 282 mg/dL — ABNORMAL HIGH (ref 70–99)
Glucose-Capillary: 315 mg/dL — ABNORMAL HIGH (ref 70–99)

## 2022-10-04 LAB — MAGNESIUM: Magnesium: 1.7 mg/dL (ref 1.7–2.4)

## 2022-10-04 MED ORDER — ALPRAZOLAM 0.25 MG PO TABS
0.2500 mg | ORAL_TABLET | Freq: Two times a day (BID) | ORAL | Status: DC | PRN
  Administered 2022-10-04: 0.25 mg via ORAL
  Filled 2022-10-04: qty 1

## 2022-10-04 MED ORDER — ESCITALOPRAM OXALATE 10 MG PO TABS
5.0000 mg | ORAL_TABLET | Freq: Every day | ORAL | Status: DC
  Administered 2022-10-04 – 2022-10-05 (×2): 5 mg via ORAL
  Filled 2022-10-04 (×2): qty 1

## 2022-10-04 MED ORDER — METHOCARBAMOL 500 MG PO TABS
500.0000 mg | ORAL_TABLET | Freq: Once | ORAL | Status: AC
  Administered 2022-10-04: 500 mg via ORAL
  Filled 2022-10-04: qty 1

## 2022-10-04 MED ORDER — TRACE MINERALS CU-MN-SE-ZN 300-55-60-3000 MCG/ML IV SOLN
INTRAVENOUS | Status: AC
  Filled 2022-10-04: qty 964.8

## 2022-10-04 MED ORDER — SODIUM CHLORIDE 0.9 % IV SOLN
1.0000 g | Freq: Three times a day (TID) | INTRAVENOUS | Status: DC
  Administered 2022-10-04 – 2022-10-19 (×44): 1 g via INTRAVENOUS
  Filled 2022-10-04 (×5): qty 1
  Filled 2022-10-04: qty 20
  Filled 2022-10-04: qty 1
  Filled 2022-10-04 (×2): qty 20
  Filled 2022-10-04: qty 1
  Filled 2022-10-04 (×2): qty 20
  Filled 2022-10-04 (×3): qty 1
  Filled 2022-10-04 (×2): qty 20
  Filled 2022-10-04 (×2): qty 1
  Filled 2022-10-04: qty 20
  Filled 2022-10-04: qty 1
  Filled 2022-10-04: qty 20
  Filled 2022-10-04: qty 1
  Filled 2022-10-04 (×4): qty 20
  Filled 2022-10-04 (×2): qty 1
  Filled 2022-10-04: qty 20
  Filled 2022-10-04: qty 1
  Filled 2022-10-04 (×7): qty 20
  Filled 2022-10-04 (×2): qty 1
  Filled 2022-10-04 (×3): qty 20
  Filled 2022-10-04: qty 1
  Filled 2022-10-04 (×6): qty 20

## 2022-10-04 MED ORDER — GABAPENTIN 100 MG PO CAPS
100.0000 mg | ORAL_CAPSULE | Freq: Three times a day (TID) | ORAL | Status: DC
  Administered 2022-10-04 (×2): 100 mg via ORAL
  Filled 2022-10-04 (×2): qty 1

## 2022-10-04 MED ORDER — INSULIN ASPART 100 UNIT/ML IJ SOLN
5.0000 [IU] | INTRAMUSCULAR | Status: DC
  Administered 2022-10-04 – 2022-10-08 (×17): 5 [IU] via SUBCUTANEOUS
  Filled 2022-10-04 (×16): qty 1

## 2022-10-04 NOTE — Consult Note (Signed)
[facility] Consult   Reason for Consult:  anxiety Referring Physician: Sharion Settler Patient Identification: Nicolas Zuniga MRN:  [id number] Principal Diagnosis: Adenocarcinoma Diagnosis:  Active Problems:   Generalized anxiety disorder   Diabetes ([place])   Essential hypertension   Paroxysmal A-fib ([place])   S/P right colectomy   S/P partial resection of colon   Hypotension   Hyponatremia   AKI (acute kidney injury) ([place])   Postoperative intra-abdominal abscess   Adenocarcinoma of colon ([place])   Persistent atrial fibrillation ([place])   Sepsis ([place])   Diabetic ketoacidosis without coma associated with type 2 diabetes mellitus ([place])   Total Time spent with patient: 45 minutes  Subjective:   Nicolas Zuniga is a 66 y.o. male patient admitted with colon cancer, consult for anxiety.  HPI:  66 yo male with colon cancer, post surgery.  He is anxious which increases with pain and discouragement that nothing seems to be working, high anxiety level.  No suicidal ideations or psychosis or substance abuse.  His wife is at his bedside and provides collateral information about his anxiety.  Moderate depression related to his pain and medical issues.  Discussed medications and decided on Lexapro and gabapentin PRN for anxiety and pain.  Past Psychiatric History: anxiety  Risk to Self:  none Risk to Others:  none Prior Inpatient Therapy:  none Prior Outpatient Therapy:  none  Past Medical History:  Past Medical History:  Diagnosis Date   Adenocarcinoma of colon ([place]) 09/08/2022   Cholelithiasis    Chronic gastritis    Colon polyps    Coronary artery calcification seen on CT scan    Diastolic dysfunction    a.) TTE 04/09/2022: EF >55%, triv TR, G1DD   Diverticulosis    Hypertension    Long term current use of anticoagulant    a.) apixaban   Nephrolithiasis    Paroxysmal A-fib ([place])    a.) CHA2DS2VASc = 3 (age, HTN, T2DM);  b.) s/p DCCV 07/09/2016 (100 J x1), 12/22/2018  (120 J x1), 08/18/2021 (120 J x1), 03/11/2022 (120 J x1), 03/31/2022 (120 J x1); c.) rate/rhythm maintained on oral flecanide + metoprolol succinate; chronically anticoagulated with apixaban   T2DM (type 2 diabetes mellitus) ([place])     Past Surgical History:  Procedure Laterality Date   CARDIOVERSION N/A 12/22/2018   Procedure: CARDIOVERSION (CATH LAB);  Surgeon: Corey Skains, MD;  Location: [facility] ORS;  Service: Cardiovascular;  Laterality: N/A;   CARDIOVERSION N/A 08/18/2021   Procedure: CARDIOVERSION;  Surgeon: Corey Skains, MD;  Location: [facility] ORS;  Service: Cardiovascular;  Laterality: N/A;   CARDIOVERSION N/A 03/11/2022   Procedure: CARDIOVERSION;  Surgeon: Corey Skains, MD;  Location: [facility] ORS;  Service: Cardiovascular;  Laterality: N/A;   CARDIOVERSION N/A 03/31/2022   Procedure: CARDIOVERSION;  Surgeon: Corey Skains, MD;  Location: [facility] ORS;  Service: Cardiovascular;  Laterality: N/A;   CHOLECYSTECTOMY N/A 09/24/2022   Procedure: LAPAROSCOPIC CHOLECYSTECTOMY;  Surgeon: Jules Husbands, MD;  Location: [facility] ORS;  Service: General;  Laterality: N/A;   COLONOSCOPY WITH PROPOFOL N/A 09/08/2022   Procedure: COLONOSCOPY WITH PROPOFOL;  Surgeon: Lucilla Lame, MD;  Location: [facility] ENDOSCOPY;  Service: Endoscopy;  Laterality: N/A;   ELECTROPHYSIOLOGIC STUDY N/A 07/09/2016   Procedure: CARDIOVERSION;  Surgeon: Dionisio David, MD;  Location: [facility] ORS;  Service: Cardiovascular;  Laterality: N/A;   LAPAROSCOPIC RIGHT COLECTOMY N/A 09/24/2022   Procedure: LAPAROSCOPIC RIGHT COLECTOMY, RNFA to assist;  Surgeon: Jules Husbands, MD;  Location: [facility]  ORS;  Service: General;  Laterality: N/A;   LAPAROTOMY N/A 09/28/2022   Procedure: EXPLORATORY LAPAROTOMY WITH OSTOMY CREATION;  Surgeon: Jules Husbands, MD;  Location: [facility] ORS;  Service: General;  Laterality: N/A;   TEE WITHOUT CARDIOVERSION N/A 07/09/2016   Procedure: TRANSESOPHAGEAL ECHOCARDIOGRAM (TEE);  Surgeon: Dionisio David, MD;   Location: [facility] ORS;  Service: Cardiovascular;  Laterality: N/A;   web fingers repaired     as a child   Family History:  Family History  Problem Relation Age of Onset   Varicose Veins Mother    Heart disease Mother    Diabetes Mother    COPD Father    Family Psychiatric  History: none Social History:  Social History   Substance and Sexual Activity  Alcohol Use No     Social History   Substance and Sexual Activity  Drug Use No    Social History   Socioeconomic History   Marital status: Married    Spouse name: Not on file   Number of children: Not on file   Years of education: Not on file   Highest education level: Not on file  Occupational History   Not on file  Tobacco Use   Smoking status: Never    Passive exposure: Never   Smokeless tobacco: Never  Vaping Use   Vaping Use: Never used  Substance and Sexual Activity   Alcohol use: No   Drug use: No   Sexual activity: Not on file  Other Topics Concern   Not on file  Social History Narrative   Lives with wife, [name], with on pet, cat.   Social Determinants of Health   Financial Resource Strain: Not on file  Food Insecurity: No Food Insecurity (09/24/2022)   Hunger Vital Sign    Worried About Running Out of Food in the Last Year: Never true    Ran Out of Food in the Last Year: Never true  Transportation Needs: No Transportation Needs (09/24/2022)   PRAPARE - Hydrologist (Medical): No    Lack of Transportation (Non-Medical): No  Physical Activity: Not on file  Stress: Not on file  Social Connections: Not on file   Additional Social History:    Allergies:  No Known Allergies  Labs:  Results for orders placed or performed during the hospital encounter of 09/24/22 (from the past 48 hour(s))  Heparin level (unfractionated)     Status: Abnormal   Collection Time: 10/02/22  1:41 PM  Result Value Ref Range   Heparin Unfractionated 0.27 (L) 0.30 - 0.70 IU/mL    Comment:  (NOTE) The clinical reportable range upper limit is being lowered to >1.10 to align with the FDA approved guidance for the current laboratory assay.  If heparin results are below expected values, and patient dosage has  been confirmed, suggest follow up testing of antithrombin III levels. Performed at [facility], [address] [place], [place] [address]   Glucose, capillary     Status: Abnormal   Collection Time: 10/02/22  4:20 PM  Result Value Ref Range   Glucose-Capillary 286 (H) 70 - 99 mg/dL    Comment: Glucose reference range applies only to samples taken after fasting for at least 8 hours.  Glucose, capillary     Status: Abnormal   Collection Time: 10/02/22  8:36 PM  Result Value Ref Range   Glucose-Capillary 243 (H) 70 - 99 mg/dL    Comment: Glucose reference range applies only to samples  taken after fasting for at least 8 hours.  Heparin level (unfractionated)     Status: None   Collection Time: 10/02/22  9:51 PM  Result Value Ref Range   Heparin Unfractionated 0.30 0.30 - 0.70 IU/mL    Comment: (NOTE) The clinical reportable range upper limit is being lowered to >1.10 to align with the FDA approved guidance for the current laboratory assay.  If heparin results are below expected values, and patient dosage has  been confirmed, suggest follow up testing of antithrombin III levels. Performed at [facility], [address] [place],  [address]   Glucose, capillary     Status: Abnormal   Collection Time: 10/03/22 12:07 AM  Result Value Ref Range   Glucose-Capillary 218 (H) 70 - 99 mg/dL    Comment: Glucose reference range applies only to samples taken after fasting for at least 8 hours.  Procalcitonin - Baseline     Status: None   Collection Time: 10/03/22  4:22 AM  Result Value Ref Range   Procalcitonin 0.88 ng/mL    Comment:        Interpretation: PCT > 0.5 ng/mL and <= 2 ng/mL: Systemic infection (sepsis) is possible, but other  conditions are known to elevate PCT as well. (NOTE)       Sepsis PCT Algorithm           Lower Respiratory Tract                                      Infection PCT Algorithm    ----------------------------     ----------------------------         PCT < 0.25 ng/mL                PCT < 0.10 ng/mL          Strongly encourage             Strongly discourage   discontinuation of antibiotics    initiation of antibiotics    ----------------------------     -----------------------------       PCT 0.25 - 0.50 ng/mL            PCT 0.10 - 0.25 ng/mL               OR       >80% decrease in PCT            Discourage initiation of                                            antibiotics      Encourage discontinuation           of antibiotics    ----------------------------     -----------------------------         PCT >= 0.50 ng/mL              PCT 0.26 - 0.50 ng/mL                AND       <80% decrease in PCT             Encourage initiation of  antibiotics       Encourage continuation           of antibiotics    ----------------------------     -----------------------------        PCT >= 0.50 ng/mL                  PCT > 0.50 ng/mL               AND         increase in PCT                  Strongly encourage                                      initiation of antibiotics    Strongly encourage escalation           of antibiotics                                     -----------------------------                                           PCT <= 0.25 ng/mL                                                 OR                                        > 80% decrease in PCT                                      Discontinue / Do not initiate                                             antibiotics  Performed at [facility], [address] [place], [place] [address]   C-reactive protein     Status: Abnormal   Collection Time: 10/03/22  4:22 AM   Result Value Ref Range   CRP 23.3 (H) <1.0 mg/dL    Comment: Performed at [facility] [address] [place], [place] [address]  CBC with Differential/Platelet     Status: Abnormal   Collection Time: 10/03/22  4:26 AM  Result Value Ref Range   WBC 35.5 (H) 4.0 - 10.5 K/uL   RBC 3.78 (L) 4.22 - 5.81 MIL/uL   Hemoglobin 8.8 (L) 13.0 - 17.0 g/dL    Comment: Reticulocyte Hemoglobin testing may be clinically indicated, consider ordering this additional test [id number]    HCT 27.8 (L) 39.0 - 52.0 %   MCV 73.5 (L) 80.0 - 100.0 fL   MCH 23.3 (L) 26.0 - 34.0 pg   MCHC 31.7 30.0 - 36.0 g/dL   RDW 15.7 (H) 11.5 - 15.5 %  Platelets 324 150 - 400 K/uL   nRBC 0.4 (H) 0.0 - 0.2 %   Neutrophils Relative % 75 %   Neutro Abs 29.8 (H) 1.7 - 7.7 K/uL   Band Neutrophils 9 %   Lymphocytes Relative 8 %   Lymphs Abs 2.8 0.7 - 4.0 K/uL   Monocytes Relative 7 %   Monocytes Absolute 2.5 (H) 0.1 - 1.0 K/uL   Eosinophils Relative 0 %   Eosinophils Absolute 0.0 0.0 - 0.5 K/uL   Basophils Relative 0 %   Basophils Absolute 0.0 0.0 - 0.1 K/uL   Myelocytes 1 %   Abs Immature Granulocytes 0.40 (H) 0.00 - 0.07 K/uL   Reactive, Benign Lymphocytes PRESENT    Ovalocytes PRESENT     Comment: Performed at [facility], [address] [place], [place] [address]  Magnesium     Status: None   Collection Time: 10/03/22  4:26 AM  Result Value Ref Range   Magnesium 1.8 1.7 - 2.4 mg/dL    Comment: Performed at [facility], [address] [place], [place] [address]  Phosphorus     Status: None   Collection Time: 10/03/22  4:26 AM  Result Value Ref Range   Phosphorus 2.6 2.5 - 4.6 mg/dL    Comment: Performed at [facility], [address] [place], [place] [address]  Basic metabolic panel     Status: Abnormal   Collection Time: 10/03/22  4:26 AM  Result Value Ref Range   Sodium 136 135 - 145 mmol/L   Potassium 4.2 3.5 - 5.1 mmol/L   Chloride 105 98 - 111 mmol/L   CO2 25  22 - 32 mmol/L   Glucose, Bld 283 (H) 70 - 99 mg/dL    Comment: Glucose reference range applies only to samples taken after fasting for at least 8 hours.   BUN 38 (H) 8 - 23 mg/dL   Creatinine, Ser 2.23 (H) 0.61 - 1.24 mg/dL   Calcium 8.4 (L) 8.9 - 10.3 mg/dL   GFR, Estimated 32 (L) >60 mL/min    Comment: (NOTE) Calculated using the CKD-EPI Creatinine Equation (2021)    Anion gap 6 5 - 15    Comment: Performed at [facility], [address] [place], [place] [address]  Heparin level (unfractionated)     Status: None   Collection Time: 10/03/22  4:26 AM  Result Value Ref Range   Heparin Unfractionated 0.34 0.30 - 0.70 IU/mL    Comment: (NOTE) The clinical reportable range upper limit is being lowered to >1.10 to align with the FDA approved guidance for the current laboratory assay.  If heparin results are below expected values, and patient dosage has  been confirmed, suggest follow up testing of antithrombin III levels. Performed at [facility], [address] [place], [place] [address]   Glucose, capillary     Status: Abnormal   Collection Time: 10/03/22  4:42 AM  Result Value Ref Range   Glucose-Capillary 282 (H) 70 - 99 mg/dL    Comment: Glucose reference range applies only to samples taken after fasting for at least 8 hours.  Sedimentation rate     Status: Abnormal   Collection Time: 10/03/22  5:00 AM  Result Value Ref Range   Sed Rate 89 (H) 0 - 20 mm/hr    Comment: Performed at [facility], [address] [place], [place] [address]  Glucose, capillary     Status: Abnormal   Collection Time: 10/03/22  7:23 AM  Result Value Ref Range   Glucose-Capillary 262 (H) 70 - 99 mg/dL    Comment: Glucose reference range applies only to samples taken after fasting for at least 8 hours.  Glucose, capillary     Status: Abnormal   Collection Time: 10/03/22 11:35 AM  Result Value Ref Range   Glucose-Capillary 281 (H) 70 - 99 mg/dL    Comment:  Glucose reference range applies only to samples taken after fasting for at least 8 hours.  Glucose, capillary     Status: Abnormal   Collection Time: 10/03/22  5:00 PM  Result Value Ref Range   Glucose-Capillary 245 (H) 70 - 99 mg/dL    Comment: Glucose reference range applies only to samples taken after fasting for at least 8 hours.  Glucose, capillary     Status: Abnormal   Collection Time: 10/03/22  8:35 PM  Result Value Ref Range   Glucose-Capillary 256 (H) 70 - 99 mg/dL    Comment: Glucose reference range applies only to samples taken after fasting for at least 8 hours.  Glucose, capillary     Status: Abnormal   Collection Time: 10/03/22 11:15 PM  Result Value Ref Range   Glucose-Capillary 291 (H) 70 - 99 mg/dL    Comment: Glucose reference range applies only to samples taken after fasting for at least 8 hours.  Glucose, capillary     Status: Abnormal   Collection Time: 10/04/22  4:07 AM  Result Value Ref Range   Glucose-Capillary 268 (H) 70 - 99 mg/dL    Comment: Glucose reference range applies only to samples taken after fasting for at least 8 hours.  CBC with Differential/Platelet     Status: Abnormal   Collection Time: 10/04/22  5:38 AM  Result Value Ref Range   WBC 36.9 (H) 4.0 - 10.5 K/uL   RBC 3.26 (L) 4.22 - 5.81 MIL/uL   Hemoglobin 7.8 (L) 13.0 - 17.0 g/dL    Comment: Reticulocyte Hemoglobin testing may be clinically indicated, consider ordering this additional test [id number]    HCT 24.1 (L) 39.0 - 52.0 %   MCV 73.9 (L) 80.0 - 100.0 fL   MCH 23.9 (L) 26.0 - 34.0 pg   MCHC 32.4 30.0 - 36.0 g/dL   RDW 15.9 (H) 11.5 - 15.5 %   Platelets 367 150 - 400 K/uL   nRBC 0.4 (H) 0.0 - 0.2 %   Neutrophils Relative % 73 %   Neutro Abs 27.2 (H) 1.7 - 7.7 K/uL   Lymphocytes Relative 9 %   Lymphs Abs 3.5 0.7 - 4.0 K/uL   Monocytes Relative 8 %   Monocytes Absolute 2.9 (H) 0.1 - 1.0 K/uL   Eosinophils Relative 1 %   Eosinophils Absolute 0.2 0.0 - 0.5 K/uL   Basophils  Relative 1 %   Basophils Absolute 0.2 (H) 0.0 - 0.1 K/uL   WBC Morphology MORPHOLOGY UNREMARKABLE    RBC Morphology MORPHOLOGY UNREMARKABLE    Smear Review Normal platelet morphology    Immature Granulocytes 8 %   Abs Immature Granulocytes 2.85 (H) 0.00 - 0.07 K/uL    Comment: Performed at [facility], [address] [place], [place] [address]  Magnesium     Status: None   Collection Time: 10/04/22  5:38 AM  Result Value Ref Range   Magnesium 1.7 1.7 - 2.4 mg/dL    Comment: Performed at [facility], [address] [place], [place] [address]  Phosphorus     Status:  None   Collection Time: 10/04/22  5:38 AM  Result Value Ref Range   Phosphorus 3.1 2.5 - 4.6 mg/dL    Comment: Performed at [facility], [address] [place], [place] [address]  Basic metabolic panel     Status: Abnormal   Collection Time: 10/04/22  5:38 AM  Result Value Ref Range   Sodium 134 (L) 135 - 145 mmol/L   Potassium 4.1 3.5 - 5.1 mmol/L   Chloride 102 98 - 111 mmol/L   CO2 23 22 - 32 mmol/L   Glucose, Bld 313 (H) 70 - 99 mg/dL    Comment: Glucose reference range applies only to samples taken after fasting for at least 8 hours.   BUN 33 (H) 8 - 23 mg/dL   Creatinine, Ser 1.67 (H) 0.61 - 1.24 mg/dL   Calcium 8.4 (L) 8.9 - 10.3 mg/dL   GFR, Estimated 45 (L) >60 mL/min    Comment: (NOTE) Calculated using the CKD-EPI Creatinine Equation (2021)    Anion gap 9 5 - 15    Comment: Performed at [facility], [address] [place], [place] [address]  Heparin level (unfractionated)     Status: Abnormal   Collection Time: 10/04/22  5:38 AM  Result Value Ref Range   Heparin Unfractionated 0.19 (L) 0.30 - 0.70 IU/mL    Comment: (NOTE) The clinical reportable range upper limit is being lowered to >1.10 to align with the FDA approved guidance for the current laboratory assay.  If heparin results are below expected values, and patient dosage has  been confirmed, suggest  follow up testing of antithrombin III levels. Performed at [facility], [address] [place], [place] [address]   Glucose, capillary     Status: Abnormal   Collection Time: 10/04/22  8:34 AM  Result Value Ref Range   Glucose-Capillary 315 (H) 70 - 99 mg/dL    Comment: Glucose reference range applies only to samples taken after fasting for at least 8 hours.  Glucose, capillary     Status: Abnormal   Collection Time: 10/04/22 11:54 AM  Result Value Ref Range   Glucose-Capillary 282 (H) 70 - 99 mg/dL    Comment: Glucose reference range applies only to samples taken after fasting for at least 8 hours.    Current Facility-Administered Medications  Medication Dose Route Frequency Provider Last Rate Last Admin   ALPRAZolam Duanne Moron) tablet 0.25 mg  0.25 mg Oral BID PRN Val Riles, MD   0.25 mg at 10/04/22 1201   amiodarone (PACERONE) tablet 200 mg  200 mg Oral Daily Tristan Schroeder, PA-C   200 mg at 10/04/22 3154   Chlorhexidine Gluconate Cloth 2 % PADS 6 each  6 each Topical Daily Pabon, [place] F, MD   6 each at 10/04/22 0844   dextrose 50 % solution 0-50 mL  0-50 mL Intravenous PRN Teressa Lower, NP       heparin ADULT infusion 100 units/mL (25000 units/232m)  3,000 Units/hr Intravenous Continuous CWynelle Cleveland RPH 30 mL/hr at 10/04/22 0945 3,000 Units/hr at 10/04/22 0945   insulin aspart (novoLOG) injection 0-20 Units  0-20 Units Subcutaneous Q4H GDallie Piles RPH   15 Units at 10/04/22 00086  insulin aspart (novoLOG) injection 5 Units  5 Units Subcutaneous Q4H POswald Hillock RPH       insulin detemir (LEVEMIR) injection 15 Units  15 Units Subcutaneous BID [facility]  15 Units at 10/04/22 [phone]  meropenem (MERREM) 1 g in sodium chloride 0.9 % 100 mL IVPB  1 g Intravenous Q8H Val Riles, MD       metoprolol tartrate (LOPRESSOR) tablet 25 mg  25 mg Oral BID Tristan Schroeder, PA-C   25 mg at 10/04/22 0843   morphine (PF) 4 MG/ML injection 2 mg  2  mg Intravenous Q3H PRN Caroleen Hamman F, MD   2 mg at 10/03/22 0035   ondansetron (ZOFRAN-ODT) disintegrating tablet 4 mg  4 mg Oral Q6H PRN Pabon, Diego F, MD       Or   ondansetron (ZOFRAN) injection 4 mg  4 mg Intravenous Q6H PRN Pabon, Diego F, MD       Oral care mouth rinse  15 mL Mouth Rinse PRN Pabon, Diego F, MD       oxyCODONE (Oxy IR/ROXICODONE) immediate release tablet 5-10 mg  5-10 mg Oral Q4H PRN Pabon, Diego F, MD   5 mg at 10/04/22 0843   pantoprazole (PROTONIX) injection 40 mg  40 mg Intravenous QHS Pabon, Bea Graff F, MD   40 mg at 10/03/22 2114   prochlorperazine (COMPAZINE) tablet 10 mg  10 mg Oral Q6H PRN Pabon, Diego F, MD       Or   prochlorperazine (COMPAZINE) injection 5-10 mg  5-10 mg Intravenous Q6H PRN Pabon, Diego F, MD       sodium chloride flush (NS) 0.9 % injection 10-40 mL  10-40 mL Intracatheter Q12H Pabon, Lankin, MD   10 mL at 10/04/22 0844   sodium chloride flush (NS) 0.9 % injection 10-40 mL  10-40 mL Intracatheter PRN Pabon, Marjory Lies, MD       TPN ADULT (ION)   Intravenous Continuous TPN Oswald Hillock, RPH 90 mL/hr at 10/04/22 0400 Infusion Verify at 10/04/22 0400   TPN ADULT (ION)   Intravenous Continuous TPN Val Riles, MD        Musculoskeletal: Strength & Muscle Tone: decreased Gait & Station:  did not witness Patient leans: N/A  Psychiatric Specialty Exam: Physical Exam Vitals and nursing note reviewed.  Constitutional:      Appearance: He is ill-appearing.  HENT:     Head: Normocephalic.     Nose: Nose normal.  Pulmonary:     Effort: Pulmonary effort is normal.  Musculoskeletal:     Cervical back: Normal range of motion.  Neurological:     General: No focal deficit present.     Mental Status: He is alert and oriented to person, place, and time.  Psychiatric:        Attention and Perception: Attention and perception normal.        Mood and Affect: Mood is anxious and depressed.        Speech: Speech normal.        Behavior: Behavior  normal. Behavior is cooperative.        Thought Content: Thought content normal.        Cognition and Memory: Cognition and memory normal.        Judgment: Judgment normal.     Review of Systems  Psychiatric/Behavioral:  Positive for depression. The patient is nervous/anxious.   All other systems reviewed and are negative.   Blood pressure 120/69, pulse 99, temperature 98.2 F (36.8 C), resp. rate 19, height '6\' 2"'$  (1.88 m), weight (!) 138.9 kg, SpO2 99 %.Body mass index is 39.32 kg/m.  General Appearance: Casual  Eye Contact:  Good  Speech:  Normal Rate  Volume:  Normal  Mood:  Anxious and Depressed  Affect:  Congruent  Thought Process:  Coherent  Orientation:  Full (Time, Place, and Person)  Thought Content:  Rumination  Suicidal Thoughts:  No  Homicidal Thoughts:  No  Memory:  Immediate;   Good Recent;   Good Remote;   Good  Judgement:  Good  Insight:  Good  Psychomotor Activity:  Decreased  Concentration:  Concentration: Good and Attention Span: Good  Recall:  Good  Fund of Knowledge:  Good  Language:  Good  Akathisia:  No  Handed:  Right  AIMS (if indicated):     Assets:  Housing Leisure Time Resilience Social Support  ADL's:  Impaired  Cognition:  WNL  Sleep:        Physical Exam: Physical Exam Vitals and nursing note reviewed.  Constitutional:      Appearance: He is ill-appearing.  HENT:     Head: Normocephalic.     Nose: Nose normal.  Pulmonary:     Effort: Pulmonary effort is normal.  Musculoskeletal:     Cervical back: Normal range of motion.  Neurological:     General: No focal deficit present.     Mental Status: He is alert and oriented to person, place, and time.  Psychiatric:        Attention and Perception: Attention and perception normal.        Mood and Affect: Mood is anxious and depressed.        Speech: Speech normal.        Behavior: Behavior normal. Behavior is cooperative.        Thought Content: Thought content normal.         Cognition and Memory: Cognition and memory normal.        Judgment: Judgment normal.    Review of Systems  Psychiatric/Behavioral:  Positive for depression. The patient is nervous/anxious.   All other systems reviewed and are negative.  Blood pressure 120/69, pulse 99, temperature 98.2 F (36.8 C), resp. rate 19, height '6\' 2"'$  (1.88 m), weight (!) 138.9 kg, SpO2 99 %. Body mass index is 39.32 kg/m.  Treatment Plan Summary: General anxiety disorder: Gabapentin 100 mg TID PRN  Depression: Lexapro 5 mg daily  Disposition: No evidence of imminent risk to self or others at present.   Patient does not meet criteria for psychiatric inpatient admission. Supportive therapy provided about ongoing stressors.  Waylan Boga, NP 10/04/2022 12:08 PM

## 2022-10-04 NOTE — Progress Notes (Signed)
PHARMACY - TOTAL PARENTERAL NUTRITION CONSULT NOTE   Indication: takeback yesterday for anastomosic leak, intra-abdominal infection, will likely have post-op ileus  Patient Measurements: Height: '6\' 2"'$  (188 cm) Weight: (!) 138.9 kg (306 lb 3.5 oz) IBW/kg (Calculated) : 82.2 TPN AdjBW (KG): 91.1 Body mass index is 39.32 kg/m.  Assessment: Pt with hx of recently diagnosed adenocarcinoma of the colon who is currently admitted status post right colectomy, diabetes, hypertension, and A-fib status post multiple ablations  Glucose / Insulin: glargine 30u/day + SSI q4h BG 262-283. Novolog 80 units/day.  Electrolytes: hyponatremic, hypophosphatemia Renal: SCR 1.93-->1.45-->4.61-->3.07 > 2.23 > 1.67 Hepatic: AST, ALT wnl Intake/Output last 2 shifts:  10/26 0701 - 10/27 0700 In: 3085  Out: 3080  MIVF: none GI Imaging:No new pertinent imaging studies GI Surgeries / Procedures: s/p exploratory laparotomy, abdominal washout, and diverting loop ileostomy 10/23  Central access: 09/24/22 TPN start date: 09/29/22  Nutritional Goals: Goal TPN rate is 90 mL/hr (provides 145g of protein and 1648 kcals per day)  RD Assessment: Estimated Needs Total Energy Estimated Needs: 2700-3000kcal/day Total Protein Estimated Needs: 135-150g/day Total Fluid Estimated Needs: 2.2-2.5L/day  Current Nutrition:  NPO  Plan:  --continue TPN at 90 mL/hr  --Nutritional components: Amino acids (using 15% Clinisol): 144.7 grams Dextrose: 360.7 grams  Lipids (using 20% SMOFlipids): 76 grams kCal: 2567 /24h --Electrolytes in TPN: Na 610mq/L, K 542m/L, Ca 10m57mL, Mg 10mE79m, and Phos 20mm28m. Cl:Ac 1:1 --Continue rSSI q4h  --Continue levemir to 15 units BID + increase to 5 units Novolog q4h and will add 25 units of insulin to TPN bag.  --take thiamine out of bag (received 3 days of thiamine) --Monitor TPN labs on Mon/Thurs, daily until stable  KishaOswald HillockrmD, BCPS 10/04/2022,9:23 AM

## 2022-10-04 NOTE — Progress Notes (Signed)
[facility] Progress Note  Patient: Nicolas Zuniga    MRN:[id number]  DOA: 09/24/2022     Date of Service: the patient was seen and examined on 10/04/2022  No chief complaint on file.  Brief hospital course: This is a 66 yo male with PMH of Adenocarcinoma of Colon (09/08/22), Cholelithiasis, Chronic Gastritis, Chronic Diastolic Dysfunction (TTE 04/09/22: EF >55%, triv TR, G1DD), Diverticulosis  HTN, Paroxysmal Atrial Fibrillation on Apixaban, Type II Diabetes Mellitus, who presented to [facility] on 10/19 for laparoscopic right colectomy and cholecystectomy.  Surgical findings were right colon mass with evidence of bulky mesenteric nodal involvement; large right colon mass adhered to the retroperitoneum; mesenteric nodes within terminal ileum; tension free anastomosis, no evidence of intraop leak and good perfusion; and chronic calculus cholecystitis.  Postop pt admitted to the medsurg unit per general surgery for additional workup and treatment.  Patient was admitted under general surgery team.   09/24/2022 - Underwent a laparoscopic right colectomy that was converted to open right colectomy along with a cholecystectomy - no significant complications and EBL was minimal at 80 cc.   10/20: Seen early morning by Dr. Pabon (general surgery) and was doing well.  Starting around 6 PM he began to have hypotension 70/41 and the hospitalist service was consulted. Pt was given total of 25 g of albumin, 2 L of normal saline, and started on glucagon gtt concern for beta blocker toxicity. His metoprolol and flecainide were held. BP improved. Mild AKI on labs Cr 1.93 down form admission 1.23, likely hypovolemia. D/c glucagon. Strict I&O. Continue hold beta blocker and flecainide, may reduce dose metoprolol on discharge.  10/21: BP soft but improved 115/53, 117/64. Cr improved 1.66. Hgb stable.  Rapid response overnight, A-fib RVR.  Transferred to stepdown, amiodarone infusion, minimal response, diltiazem infusion.   Dr. Khan, cardiology, aware. Dr Pabon ordering CT chest/abdomen/pelvis, starting heparin drip. CT no PE, (+) Small bowel leak, certainly no contrast extravasation and seems contained --> plan repeat CT, hold off on surgical intervention unless emergent, starting abx.  10/23: Patient went for repeat CT noting increased extravasation concerning for anastomotic leak.  Patient taken back to the OR and well no leak found, underwent bowel follow-through and washout.  Postop, patient in ICU and still intubated.  ABG notes severe respiratory acidosis.  Note that should patient remain on ventilator, will have critical care assumed medical management in conjunction with surgery. 10/24: Pt with worsening acute renal failure Nephrology consulted.  Pt with bradycardia overnight cardizem and precedex gtts discontinued.  Pt remains on amiodarone gtt at 30 mg/hr  Pt alert and following commands plans for possible SBT today ~ EXTUBATED 10/25: Tolerating extubation from respiratory standpoint. Off Vasopressors. Slight worsening of AKI, starting to make urine.  DKA resolved, weaning off insulin gtt to SQ insulin.  Atrial fibrillation rate controlled on Amiodarone.   Assessment and Plan:  Right colon cancer and cholelithiasis s/p laparoscopic right colectomy and cholecystectomy: 09/24/22 Intra-abdominal infection s/p exploratory laparotomy with abdominal washout and diverting loop ileostomy 10/23 -s/p Zosyn DC'd on 10/29 -General Surgery primary service, appreciate input  -s/p NG LIS 10/26, NG tube was clamped and output is very minimal, general surgery recommended to start clear liquid diet and NG tube was removed.   10/28 WBC count 35.5 significant elevated, discussed with general surgery, CT a/p ordered ESR 89, Pro-Cal 0.88, CRP 23.3 10/28 CT A/P: Decreased free gas, multiple fluid collections no definitive abscess due to lack of IV contrast.  Small bilateral pleural   effusion 10/29 ordered fluid culture from JP  drain 10/29 started meropenem due to persistent leukocytosis and DC Zosyn -Trend WBC's and Monitor temp curve   Acute respiratory failure secondary to severe metabolic acidosis  Mechanical intubation intraoperatively for airway protection ~ EXTUBATED 10/24 -Treatment of metabolic disorders as outlined below -Supplemental O2 as needed to maintain O2 sats >92% -Follow intermittent Chest X-ray & ABG as needed -Bronchodilators & Pulmicort nebs -Aggressive Pulmonary toilet as able Sputum culture Serratia marcescens, resistant to cefazolin only. S/p Zosyn, currently on meropenem.   Atrial fibrillation with rvr~improving   Hypotension secondary to hypovolemia and septic shock ~ RESOLVED Mildly elevated Troponin due to demand ischemia Hx: HTN and Chronic diastolic dysfunction  ECHOCARDIOGRAM 09/29/22: LVEF 43-15%, normal diastolic function, RV function normal -Continuous cardiac monitoring -Maintain MAP >65 -IV fluids -Vasopressors as needed to maintain MAP goal ~ weaned off -Trend lactic acid until normalized (5.7 ~ 4.4. ~ 2.7 ~) -HS Troponin peaked at 109 -Cardiology following, appreciate input -s/p Amiodarone infusion, discontinued on 10/27, started amiodarone 200 mg p.o. twice daily for 4 doses followed by 200 daily  Continue heparin infusion -BP improved, consider re-implementing outpatient BB, will defer to Cardiology    Acute kidney injury secondary ATN~improving Hyponatremia ~ IMPROVING Anion gap metabolic acidosis ~ RESOLVED Mild Hyponatremia -Monitor I&O's / urinary output -Follow BMP -Ensure adequate renal perfusion -Avoid nephrotoxic agents as able -Replace electrolytes as indicated -Pharmacy following for assistance with electrolyte replacement  -Nephrology following, appreciate input Creatinine 1.86--4.6--4.39--3.7 --2.23--1.67 gradually improving      Type II diabetes mellitus  DKA -CBG's q4h; Target range of 140 to 180 Continue Levemir 20 units subcu  daily -SSI (DKA resolved, in process of transitioning to SQ insulin) -Follow Hypo/Hyperglycemia protocol -Diabetes coordinator following, appreciate input    Body mass index is 39.32 kg/m.  Nutrition Problem: Increased nutrient needs Etiology: post-op healing Interventions: Interventions: Refer to RD note for recommendations  Pressure Injury 09/30/22 Nare Left Deep Tissue Pressure Injury - Purple or maroon localized area of discolored intact skin or blood-filled blister due to damage of underlying soft tissue from pressure and/or shear. nonblanchable, red/purple bruise at lef (Active)  09/30/22 1100  Location: Nare  Location Orientation: Left  Staging: Deep Tissue Pressure Injury - Purple or maroon localized area of discolored intact skin or blood-filled blister due to damage of underlying soft tissue from pressure and/or shear.  Wound Description (Comments): nonblanchable, red/purple bruise at left nare  Present on Admission:   Dressing Type None 10/04/22 0715     Diet: CLD DVT Prophylaxis: Therapeutic Anticoagulation with heparin IV infusion    Advance goals of care discussion: Full code  Family Communication: family was present at bedside, at the time of interview.  The pt provided permission to discuss medical plan with the family. Opportunity was given to ask question and all questions were answered satisfactorily.   Disposition:  Pt is from Home, admitted with colon cancer, s/p surgical resection, developed AKI, A-fib with RVR still has NG tube, started clear liquid diet, which precludes a safe discharge. Discharge to Home with HH/PT vs SNF TBD, when clinically stable.  Subjective: No significant events overnight.  Patient has generalized abdominal pain but it is well controlled now, denies worsening.  Denies any shortness of breath or chest pain or palpitations. Currently patient is tolerating diet well, no nausea vomiting.    Physical Exam: General:  alert oriented to  time, place, and person.  Appear in mild distress, affect appropriate Eyes: PERRLA  ENT: Oral Mucosa Clear, moist  Neck: no JVD,  Cardiovascular: Irregular rhythm, no Murmur,  Respiratory: good respiratory effort, Bilateral Air entry equal and Decreased, mild Crackles, no wheezes Abdomen: Bowel Sound present, Soft and moderate generalized tenderness, s/p colostomy bag attached, JP drain intact Skin: no rashes Extremities: mild 1-2+ Pedal edema, no calf tenderness Neurologic: without any new focal findings Gait not checked due to patient safety concerns  Vitals:   10/03/22 2047 10/03/22 2329 10/04/22 0404 10/04/22 1155  BP: (!) 142/65 (!) 121/58 (!) 143/67 120/69  Pulse: 99 (!) 103 99 99  Resp: _0 Temp: 98.5 F (36.9 C) 98.7 F (37.1 C) 98.2 F (36.8 C) 98.2 F (36.8 C)  TempSrc: Oral Oral Oral   SpO2: 96% 95% 94% 99%  Weight:      Height:        Intake/Output Summary (Last 24 hours) at 10/04/2022 1317 Last data filed at 10/04/2022 0400 Gross per 24 hour  Intake 3085.3 ml  Output 3080 ml  Net 5.3 ml   Filed Weights   09/27/22 0148 09/30/22 0500 10/01/22 0427  Weight: 118 kg (!) 139.3 kg (!) 138.9 kg    Data Reviewed: I have personally reviewed and interpreted daily labs, tele strips, imagings as discussed above. I reviewed all nursing notes, pharmacy notes, vitals, pertinent old records I have discussed plan of care as described above with RN and patient/family.  CBC: Recent Labs  Lab 09/30/22 0413 10/01/22 0136 10/02/22 0524 10/03/22 0426 10/04/22 0538  WBC 14.7* 17.7* 29.5* 35.5* 36.9*  NEUTROABS 10.6* 13.2* 22.8* 29.8* 27.2*  HGB 7.6* 8.1* 7.7* 8.8* 7.8*  HCT 24.2* 25.5* 24.4* 27.8* 24.1*  MCV 74.5* 74.6* 73.9* 73.5* 73.9*  PLT 199 211 261 324 867   Basic Metabolic Panel: Recent Labs  Lab 09/30/22 0413 10/01/22 0136 10/02/22 0524 10/03/22 0426 10/04/22 0538  NA 131* 133* 133* 136 134*  K 3.4* 3.9 3.8 4.2 4.1  CL 99 101 102 105 102   CO2 22 22 20* 25 23  GLUCOSE 150* 165* 284* 283* 313*  BUN 46* 52* 48* 38* 33*  CREATININE 4.61* 4.39* 3.07* 2.23* 1.67*  CALCIUM 8.4* 8.4* 8.3* 8.4* 8.4*  MG 2.6* 2.6* 2.4 1.8 1.7  PHOS 1.6* 2.1* 2.4* 2.6 3.1    Studies: CT ABDOMEN PELVIS WO CONTRAST  Result Date: 10/03/2022 CLINICAL DATA:  Abdominal pain. Postop leukocytosis. Status post right hemicolectomy for colon mass on 09/24/2022. Patient was re-explored 09/28/2022 and found to have purulent fluid in the right upper quadrant with possible Peri anastomotic abscess. EXAM: CT ABDOMEN AND PELVIS WITHOUT CONTRAST TECHNIQUE: Multidetector CT imaging of the abdomen and pelvis was performed following the standard protocol without IV contrast. RADIATION DOSE REDUCTION: This exam was performed according to the departmental dose-optimization program which includes automated exposure control, adjustment of the mA and/or kV according to patient size and/or use of iterative reconstruction technique. COMPARISON:  09/28/2022 FINDINGS: Lower chest: Dependent collapse/consolidation is seen in both lungs with small bilateral pleural effusions. Hepatobiliary: No suspicious focal abnormality in the liver on this study without intravenous contrast. Gallbladder surgically absent. No intrahepatic or extrahepatic biliary dilation. Pancreas: No focal mass lesion. No dilatation of the main duct. No intraparenchymal cyst. No peripancreatic edema. Spleen: No splenomegaly. No focal mass lesion. Adrenals/Urinary Tract: No adrenal nodule or mass. Kidneys unremarkable. No evidence for hydroureter. Bladder is decompressed by Foley catheter. Gas in the bladder lumen is compatible with the instrumentation. Stomach/Bowel: Oral contrast material is  seen in the stomach. Duodenum is normally positioned as is the ligament of Treitz. No small bowel wall thickening. No small bowel dilatation. Loop ileostomy noted right abdomen. Right hemicolectomy. Transverse and left colon is  opacified and decompressed. Surgical drain is identified in the right abdomen adjacent to the ileocolic anastomosis. Vascular/Lymphatic: There is mild atherosclerotic calcification of the abdominal aorta without aneurysm. There is no gastrohepatic or hepatoduodenal ligament lymphadenopathy. No retroperitoneal or mesenteric lymphadenopathy. No pelvic sidewall lymphadenopathy. Reproductive: The prostate gland and seminal vesicles are unremarkable. Other: Small volume free fluid is seen adjacent to the liver, similar to prior. Scattered collections of fluid are seen in the mesentery and bilateral paracolic gutters, also similar to prior. Volume of intraperitoneal gas has decreased in the interval. 14.7 x 2.0 cm relatively well-defined Lynn shaped collection of fluid is identified anterior abdomen (image 53/2). Open midline wound evident. Musculoskeletal: No worrisome lytic or sclerotic osseous abnormality. IMPRESSION: 1. Decreasing intraperitoneal free gas since 09/28/2022 as expected. Free fluid around the liver is similar to prior with multiple fluid collections in the mesentery in fluid pooling in the para colic gutters bilaterally. No definite abscess at this time although lack of intravenous contrast lessens sensitivity for detection. Superinfection of any of the intraperitoneal fluid collections cannot be excluded on this study. 2. Right abdominal loop ileostomy in this patient with right hemicolectomy. 3. Dependent collapse/consolidation in both lungs with small bilateral pleural effusions. 4.  Aortic Atherosclerosis (ICD10-I70.0). Electronically Signed   By: Eric  Mansell M.D.   On: 10/03/2022 15:48    Scheduled Meds:  amiodarone  200 mg Oral Daily   Chlorhexidine Gluconate Cloth  6 each Topical Daily   escitalopram  5 mg Oral Daily   gabapentin  100 mg Oral TID   insulin aspart  0-20 Units Subcutaneous Q4H   insulin aspart  5 Units Subcutaneous Q4H   insulin detemir  15 Units Subcutaneous BID    metoprolol tartrate  25 mg Oral BID   pantoprazole (PROTONIX) IV  40 mg Intravenous QHS   sodium chloride flush  10-40 mL Intracatheter Q12H   Continuous Infusions:  heparin 3,000 Units/hr (10/04/22 0945)   meropenem (MERREM) IV 1 g (10/04/22 1301)   TPN ADULT (ION) 90 mL/hr at 10/04/22 0400   TPN ADULT (ION)     PRN Meds: dextrose, morphine injection, ondansetron **OR** ondansetron (ZOFRAN) IV, mouth rinse, oxyCODONE, prochlorperazine **OR** prochlorperazine, sodium chloride flush  Time spent: 35 minutes  Author: DILEEP KUMAR. MD [facility] 10/04/2022 1:17 PM  To reach On-call, see care teams to locate the attending and reach out to them via [email or web address]. If 7PM-7AM, please contact night-coverage If you still have difficulty reaching the attending provider, please page the DOC (Director on Call) for [facility] on amion for assistance. 

## 2022-10-04 NOTE — TOC Progression Note (Addendum)
Transition of Care [facility]) - Progression Note    Patient Details  Name: DRADEN COTTINGHAM MRN: [id number] Date of Birth: 1956/01/26  Transition of Care [facility]) CM/SW [place], LCSW Phone Number: 10/04/2022, 12:14 PM  Clinical Narrative:  Met with patient and wife. Discussed current therapy recommendations for inpatient rehab and they are agreeable. Gave CMS information for inpatient rehab centers within 100 miles of their zip code. First preference is [facility] because it is easier for wife to visit there. Second preference is [facility]. Tried calling [facility] but the person that answered stated that admissions is only available Monday-Friday and he did not know their fax number. Also discussed SNF backup plan with patient and wife. First preference would be [facility]. Will send out referral once TPN is starting to be weaned. No further concerns. CSW encouraged patient and his wife to contact CSW as needed. CSW will continue to follow patient and his wife for support and facilitate discharge once medically stable.   Expected Discharge Plan: [place] Barriers to Discharge: Continued Medical Work up  Expected Discharge Plan and Services Expected Discharge Plan: [place]   Discharge Planning Services: CM Consult Post Acute Care Choice: [place] Living arrangements for the past 2 months: Single Family Home                                       Social Determinants of Health (SDOH) Interventions    Readmission Risk Interventions    09/30/2022    5:08 PM  Readmission Risk Prevention Plan  Transportation Screening Complete  PCP or Specialist Appt within 3-5 Days Complete  HRI or [place] Complete  Social Work Consult for [place] Planning/Counseling Complete  Palliative Care Screening Not Applicable  Medication Review Press photographer) Complete

## 2022-10-04 NOTE — Progress Notes (Signed)
       CROSS COVER NOTE  NAME: WORTHINGTON CRUZAN MRN: [id number] DOB : 1956-10-10 ATTENDING PHYSICIAN: Val Riles, MD    Date of Service   10/04/2022   HPI/Events of Note   Notified by nursing staff of 339m of ileostomy output that is bright red blood with clots and dark stool.  Interventions   Assessment/Plan:  CBC--> HGB 8.0 Hold Heparin     This document was prepared using Dragon voice recognition software and may include unintentional dictation errors.  KNeomia GlassDNP, MBA, FNP-BC Nurse Practitioner [facility] AssociationPager [phone]

## 2022-10-04 NOTE — Progress Notes (Signed)
[place], [place] 10/04/22  Subjective:   Hospital day # 10   Wife at bedside. Alert and oriented Room air Remains on amiodarone  TPN at  90 cc/h., heparin drip  Foley catheter NGT removed   Renal: 10/28 0701 - 10/29 0700 In: 3085.3 [I.V.:2812.9; IV Piggyback:272.4] Out: 3080 [Urine:2900; Drains:5; Stool:175] Lab Results  Component Value Date   CREATININE 1.67 (H) 10/04/2022   CREATININE 2.23 (H) 10/03/2022   CREATININE 3.07 (H) 10/02/2022     Objective:  Vital signs in last 24 hours:  Temp:  [97.9 F (36.6 C)-98.7 F (37.1 C)] 98.2 F (36.8 C) (10/29 0404) Pulse Rate:  [88-103] 99 (10/29 0404) Resp:  [18-20] 18 (10/29 0404) BP: (121-143)/(58-82) 143/67 (10/29 0404) SpO2:  [94 %-97 %] 94 % (10/29 0404)  Weight change:  Filed Weights   09/27/22 0148 09/30/22 0500 10/01/22 0427  Weight: 118 kg (!) 139.3 kg (!) 138.9 kg    Intake/Output:    Intake/Output Summary (Last 24 hours) at 10/04/2022 1047 Last data filed at 10/04/2022 0400 Gross per 24 hour  Intake 3085.3 ml  Output 3080 ml  Net 5.3 ml      Physical Exam: General:  laying in the bed  HEENT Normocephalic  Pulm/lungs Normal breathing effort, Diminished   CVS/Heart Irregular   Abdomen:  ostomy in place, surgical drain , midline incision dressed   Extremities: + peripheral edema present  Neurologic: Alert, able to answer simple questions  Skin: No acute rashes          Basic Metabolic Panel:  Recent Labs  Lab 09/30/22 0413 10/01/22 0136 10/02/22 0524 10/03/22 0426 10/04/22 0538  NA 131* 133* 133* 136 134*  K 3.4* 3.9 3.8 4.2 4.1  CL 99 101 102 105 102  CO2 22 22 20* 25 23  GLUCOSE 150* 165* 284* 283* 313*  BUN 46* 52* 48* 38* 33*  CREATININE 4.61* 4.39* 3.07* 2.23* 1.67*  CALCIUM 8.4* 8.4* 8.3* 8.4* 8.4*  MG 2.6* 2.6* 2.4 1.8 1.7  PHOS 1.6* 2.1* 2.4* 2.6 3.1      CBC: Recent Labs  Lab 09/30/22 0413 10/01/22 0136 10/02/22 0524  10/03/22 0426 10/04/22 0538  WBC 14.7* 17.7* 29.5* 35.5* 36.9*  NEUTROABS 10.6* 13.2* 22.8* 29.8* 27.2*  HGB 7.6* 8.1* 7.7* 8.8* 7.8*  HCT 24.2* 25.5* 24.4* 27.8* 24.1*  MCV 74.5* 74.6* 73.9* 73.5* 73.9*  PLT 199 211 261 324 367      No results found for: "HEPBSAG", "HEPBSAB", "HEPBIGM"    Microbiology:  Recent Results (from the past 240 hour(s))  MRSA Next Gen by PCR, Nasal     Status: None   Collection Time: 09/27/22  1:53 AM   Specimen: Nasal Mucosa; Nasal Swab  Result Value Ref Range Status   MRSA by PCR Next Gen NOT DETECTED NOT DETECTED Final    Comment: (NOTE) The GeneXpert MRSA Assay (FDA approved for NASAL specimens only), is one component of a comprehensive MRSA colonization surveillance program. It is not intended to diagnose MRSA infection nor to guide or monitor treatment for MRSA infections. Test performance is not FDA approved in patients less than 57 years old. Performed at [facility], [address] [place], [place] [address]   Culture, Respiratory w Gram Stain     Status: None   Collection Time: 09/29/22  8:23 AM   Specimen: Tracheal Aspirate; Respiratory  Result Value Ref Range Status   Specimen Description   Final    TRACHEAL ASPIRATE  Performed at [facility], [address], [place], [place] [address]    Special Requests   Final    NONE Performed at [facility], [address] [place], [place] [address]    Gram Stain   Final    RARE WBC PRESENT, PREDOMINANTLY PMN RARE BUDDING YEAST SEEN Performed at [facility], [place] [address] [place], [place] [address]    Culture   Final    MODERATE SERRATIA MARCESCENS FEW CANDIDA ALBICANS    Report Status 10/01/2022 FINAL  Final   Organism ID, Bacteria SERRATIA MARCESCENS  Final      Susceptibility   Serratia marcescens - MIC*    CEFAZOLIN >=64 RESISTANT Resistant     CEFEPIME <=0.12 SENSITIVE Sensitive     CEFTAZIDIME <=1 SENSITIVE Sensitive      CEFTRIAXONE 1 SENSITIVE Sensitive     CIPROFLOXACIN <=0.25 SENSITIVE Sensitive     GENTAMICIN <=1 SENSITIVE Sensitive     TRIMETH/SULFA <=20 SENSITIVE Sensitive     * MODERATE SERRATIA MARCESCENS    Coagulation Studies: No results for input(s): "LABPROT", "INR" in the last 72 hours.   Urinalysis: No results for input(s): "COLORURINE", "LABSPEC", "PHURINE", "GLUCOSEU", "HGBUR", "BILIRUBINUR", "KETONESUR", "PROTEINUR", "UROBILINOGEN", "NITRITE", "LEUKOCYTESUR" in the last 72 hours.  Invalid input(s): "APPERANCEUR"    Imaging: CT ABDOMEN PELVIS WO CONTRAST  Result Date: 10/03/2022 CLINICAL DATA:  Abdominal pain. Postop leukocytosis. Status post right hemicolectomy for colon mass on 09/24/2022. Patient was re-explored 09/28/2022 and found to have purulent fluid in the right upper quadrant with possible Peri anastomotic abscess. EXAM: CT ABDOMEN AND PELVIS WITHOUT CONTRAST TECHNIQUE: Multidetector CT imaging of the abdomen and pelvis was performed following the standard protocol without IV contrast. RADIATION DOSE REDUCTION: This exam was performed according to the departmental dose-optimization program which includes automated exposure control, adjustment of the mA and/or kV according to patient size and/or use of iterative reconstruction technique. COMPARISON:  09/28/2022 FINDINGS: Lower chest: Dependent collapse/consolidation is seen in both lungs with small bilateral pleural effusions. Hepatobiliary: No suspicious focal abnormality in the liver on this study without intravenous contrast. Gallbladder surgically absent. No intrahepatic or extrahepatic biliary dilation. Pancreas: No focal mass lesion. No dilatation of the main duct. No intraparenchymal cyst. No peripancreatic edema. Spleen: No splenomegaly. No focal mass lesion. Adrenals/Urinary Tract: No adrenal nodule or mass. Kidneys unremarkable. No evidence for hydroureter. Bladder is decompressed by Foley catheter. Gas in the bladder lumen is  compatible with the instrumentation. Stomach/Bowel: Oral contrast material is seen in the stomach. Duodenum is normally positioned as is the ligament of Treitz. No small bowel wall thickening. No small bowel dilatation. Loop ileostomy noted right abdomen. Right hemicolectomy. Transverse and left colon is opacified and decompressed. Surgical drain is identified in the right abdomen adjacent to the ileocolic anastomosis. Vascular/Lymphatic: There is mild atherosclerotic calcification of the abdominal aorta without aneurysm. There is no gastrohepatic or hepatoduodenal ligament lymphadenopathy. No retroperitoneal or mesenteric lymphadenopathy. No pelvic sidewall lymphadenopathy. Reproductive: The prostate gland and seminal vesicles are unremarkable. Other: Small volume free fluid is seen adjacent to the liver, similar to prior. Scattered collections of fluid are seen in the mesentery and bilateral paracolic gutters, also similar to prior. Volume of intraperitoneal gas has decreased in the interval. 14.7 x 2.0 cm relatively well-defined Jeani Hawking shaped collection of fluid is identified anterior abdomen (image 53/2). Open midline wound evident. Musculoskeletal: No worrisome lytic or sclerotic osseous abnormality. IMPRESSION: 1. Decreasing intraperitoneal free gas since 09/28/2022 as expected. Free fluid  around the liver is similar to prior with multiple fluid collections in the mesentery in fluid pooling in the para colic gutters bilaterally. No definite abscess at this time although lack of intravenous contrast lessens sensitivity for detection. Superinfection of any of the intraperitoneal fluid collections cannot be excluded on this study. 2. Right abdominal loop ileostomy in this patient with right hemicolectomy. 3. Dependent collapse/consolidation in both lungs with small bilateral pleural effusions. 4.  Aortic Atherosclerosis (ICD10-I70.0). Electronically Signed   By: Misty Stanley M.D.   On: 10/03/2022 15:48      Medications:    heparin 3,000 Units/hr (10/04/22 0945)   meropenem (MERREM) IV     TPN ADULT (ION) 90 mL/hr at 10/04/22 0400   TPN ADULT (ION)      amiodarone  200 mg Oral Daily   Chlorhexidine Gluconate Cloth  6 each Topical Daily   insulin aspart  0-20 Units Subcutaneous Q4H   insulin aspart  5 Units Subcutaneous Q4H   insulin detemir  15 Units Subcutaneous BID   metoprolol tartrate  25 mg Oral BID   pantoprazole (PROTONIX) IV  40 mg Intravenous QHS   sodium chloride flush  10-40 mL Intracatheter Q12H   ALPRAZolam, dextrose, morphine injection, ondansetron **OR** ondansetron (ZOFRAN) IV, mouth rinse, oxyCODONE, prochlorperazine **OR** prochlorperazine, sodium chloride flush  Assessment/ Plan:  66 y.o. male with  medical problems of atrial fibrillation, history of cardioversion, colon mass, hyperlipidemia, type 2 diabetes admitted on 09/24/2022 for S/P right colectomy [Z90.49] S/P partial resection of colon [Z90.49]  1.  Acute kidney injury Baseline creatinine 1.06 from 09/14/2022.  Cr 1.7  AKI likely secondary to ATN, oligoanuric, urine output has since increased. UOP 2900 mL yesterday  Renal function stable and adequate UOP recorded. No need for dialysis at this time. Expect renal function to continue to slowly improve. Avoid nephrotoxic agents and therapies, also avoid hypotension.   Nephrology signing off at this time.     2.  Acute respiratory failure Room air    LOS: [address] 10/29/202310:47 AM  [place], [place]

## 2022-10-04 NOTE — Progress Notes (Signed)
Nicolas Zuniga Day(s): 10.   Post op day(s): 6 Days Post-Op.   Interval History:  Patient seen and examined No acute events or new complaints overnight.  Patient rannot get comfortable wants to sit up at bedside, but in so doing the appliance came loose of his ileostomy... , no appetite.  No fever, chills, nausea, emesis  Leukocytosis continues to climb; 36.9K Hgb stable down to 7.8 Renal function making improvements; sCr - 1.67; UO - 2900 ccs.  Nephrology on board   NGT removed (10/26) Surgical drain; 15 ccs; opaque serous/purulent, but minimal.  Output from diverting loop ileostomy; thin, bilious   Vital signs in last 24 hours: [min-max] current  Temp:  [97.4 F (36.3 C)-98.7 F (37.1 C)] 98.2 F (36.8 C) (10/29 0404) Pulse Rate:  [85-103] 99 (10/29 0404) Resp:  [18-20] 18 (10/29 0404) BP: (121-143)/(58-82) 143/67 (10/29 0404) SpO2:  [94 %-97 %] 94 % (10/29 0404)     Height: '6\' 2"'$  (188 cm) Weight: (!) 138.9 kg BMI (Calculated): 39.3   Intake/Output last 2 shifts:  10/28 0701 - 10/29 0700 In: 3085.3 [I.V.:2812.9; IV Piggyback:272.4] Out: 3080 [Urine:2900; Drains:5; Stool:175]   Physical Exam:  Constitutional: Alert; awake, NAD Respiratory: No respiratory distress; on [place] Cardiovascular: Atrial fibrillation; 90-100 bpm Gastrointestinal: Soft, he does not appear tender, non-distended, no rebound/guarding. Surgical drain with minimal white output. Diverting loop colostomy in right abdomen; succus in bag, and on garments/floor etc; Genitourinary: Foley in place; urine clear  Integumentary: Midline laparotomy sub-q open to fascia, fascia intact, no erythema.  Recently dressed.   Labs:     Latest Ref Rng & Units 10/04/2022    5:38 AM 10/03/2022    4:26 AM 10/02/2022    5:24 AM  CBC  WBC 4.0 - 10.5 K/uL 36.9  35.5  29.5   Hemoglobin 13.0 - 17.0 g/dL 7.8  8.8  7.7   Hematocrit 39.0 - 52.0 % 24.1  27.8  24.4   Platelets 150 -  400 K/uL 367  324  261       Latest Ref Rng & Units 10/04/2022    5:38 AM 10/03/2022    4:26 AM 10/02/2022    5:24 AM  CMP  Glucose 70 - 99 mg/dL 313  283  284   BUN 8 - 23 mg/dL 33  38  48   Creatinine 0.61 - 1.24 mg/dL 1.67  2.23  3.07   Sodium 135 - 145 mmol/L 134  136  133   Potassium 3.5 - 5.1 mmol/L 4.1  4.2  3.8   Chloride 98 - 111 mmol/L 102  105  102   CO2 22 - 32 mmol/L '23  25  20   '$ Calcium 8.9 - 10.3 mg/dL 8.4  8.4  8.3      Imaging studies:  EXAM: CT ABDOMEN AND PELVIS WITHOUT CONTRAST   TECHNIQUE: Multidetector CT imaging of the abdomen and pelvis was performed following the standard protocol without IV contrast.   RADIATION DOSE REDUCTION: This exam was performed according to the departmental dose-optimization program which includes automated exposure control, adjustment of the mA and/or kV according to patient size and/or use of iterative reconstruction technique.   COMPARISON:  09/28/2022   FINDINGS: Lower chest: Dependent collapse/consolidation is seen in both lungs with small bilateral pleural effusions.   Hepatobiliary: No suspicious focal abnormality in the liver on this study without intravenous contrast. Gallbladder surgically absent. No intrahepatic or extrahepatic biliary dilation.   Pancreas:  No focal mass lesion. No dilatation of the main duct. No intraparenchymal cyst. No peripancreatic edema.   Spleen: No splenomegaly. No focal mass lesion.   Adrenals/Urinary Tract: No adrenal nodule or mass. Kidneys unremarkable. No evidence for hydroureter. Bladder is decompressed by Foley catheter. Gas in the bladder lumen is compatible with the instrumentation.   Stomach/Bowel: Oral contrast material is seen in the stomach. Duodenum is normally positioned as is the ligament of Treitz. No small bowel wall thickening. No small bowel dilatation. Loop ileostomy noted right abdomen. Right hemicolectomy. Transverse and left colon is opacified and  decompressed. Surgical drain is identified in the right abdomen adjacent to the ileocolic anastomosis.   Vascular/Lymphatic: There is mild atherosclerotic calcification of the abdominal aorta without aneurysm. There is no gastrohepatic or hepatoduodenal ligament lymphadenopathy. No retroperitoneal or mesenteric lymphadenopathy. No pelvic sidewall lymphadenopathy.   Reproductive: The prostate gland and seminal vesicles are unremarkable.   Other: Small volume free fluid is seen adjacent to the liver, similar to prior. Scattered collections of fluid are seen in the mesentery and bilateral paracolic gutters, also similar to prior. Volume of intraperitoneal gas has decreased in the interval. 14.7 x 2.0 cm relatively well-defined Jeani Hawking shaped collection of fluid is identified anterior abdomen (image 53/2). Open midline wound evident.   Musculoskeletal: No worrisome lytic or sclerotic osseous abnormality.   IMPRESSION: 1. Decreasing intraperitoneal free gas since 09/28/2022 as expected. Free fluid around the liver is similar to prior with multiple fluid collections in the mesentery in fluid pooling in the para colic gutters bilaterally. No definite abscess at this time although lack of intravenous contrast lessens sensitivity for detection. Superinfection of any of the intraperitoneal fluid collections cannot be excluded on this study. 2. Right abdominal loop ileostomy in this patient with right hemicolectomy. 3. Dependent collapse/consolidation in both lungs with small bilateral pleural effusions. 4.  Aortic Atherosclerosis (ICD10-I70.0).     Electronically Signed   By: Misty Stanley M.D.   On: 10/03/2022 15:48   Assessment/Plan: 66 y.o. male 6 Days Post-Op s/p exploratory laparotomy, abdominal washout, and diverting loop ileostomy for intra-abdominal purulent fluid after initial open right colectomy with open cholecystectomy for right colon cancer and cholelithiasis on 10/19  -  CT somewhat reassuring...   - Stay at [place] + Nutritional supplementation   - Continue TPN; at goal. Once tolerating FLD we can consider weaning this   - Continue IV Abx (Zosyn)  - Continue foley catheter for UO monitoring; sCr improved slightly, making urine; nephrology following  - Monitor abdominal examination; on-going ileostomy function  - Midline wound care: Pack daily with saline moistened gauze, cover, secure  - Continue surgical drain; monitor and record output  - Pain control prn; antiemetics prn  - Appreciate nephrology assistance as well    All of the above findings and recommendations were discussed with the patient, and the medical team.  Ronny Bacon, M.D., [facility]  10/04/2022 ; 7:14 AM

## 2022-10-04 NOTE — Progress Notes (Signed)
SUBJECTIVE: Patient appears comfortable   Vitals:   10/03/22 2047 10/03/22 2329 10/04/22 0404 10/04/22 1155  BP: (!) 142/65 (!) 121/58 (!) 143/67 120/69  Pulse: 99 (!) 103 99 99  Resp: '20 20 18 19  '$ Temp: 98.5 F (36.9 C) 98.7 F (37.1 C) 98.2 F (36.8 C) 98.2 F (36.8 C)  TempSrc: Oral Oral Oral   SpO2: 96% 95% 94% 99%  Weight:      Height:        Intake/Output Summary (Last 24 hours) at 10/04/2022 1308 Last data filed at 10/04/2022 0400 Gross per 24 hour  Intake 3085.3 ml  Output 3080 ml  Net 5.3 ml    LABS: Basic Metabolic Panel: Recent Labs    10/03/22 0426 10/04/22 0538  NA 136 134*  K 4.2 4.1  CL 105 102  CO2 25 23  GLUCOSE 283* 313*  BUN 38* 33*  CREATININE 2.23* 1.67*  CALCIUM 8.4* 8.4*  MG 1.8 1.7  PHOS 2.6 3.1   Liver Function Tests: No results for input(s): "AST", "ALT", "ALKPHOS", "BILITOT", "PROT", "ALBUMIN" in the last 72 hours. No results for input(s): "LIPASE", "AMYLASE" in the last 72 hours. CBC: Recent Labs    10/03/22 0426 10/04/22 0538  WBC 35.5* 36.9*  NEUTROABS 29.8* 27.2*  HGB 8.8* 7.8*  HCT 27.8* 24.1*  MCV 73.5* 73.9*  PLT 324 367   Cardiac Enzymes: No results for input(s): "CKTOTAL", "CKMB", "CKMBINDEX", "TROPONINI" in the last 72 hours. BNP: Invalid input(s): "POCBNP" D-Dimer: No results for input(s): "DDIMER" in the last 72 hours. Hemoglobin A1C: No results for input(s): "HGBA1C" in the last 72 hours. Fasting Lipid Panel: No results for input(s): "CHOL", "HDL", "LDLCALC", "TRIG", "CHOLHDL", "LDLDIRECT" in the last 72 hours. Thyroid Function Tests: No results for input(s): "TSH", "T4TOTAL", "T3FREE", "THYROIDAB" in the last 72 hours.  Invalid input(s): "FREET3" Anemia Panel: No results for input(s): "VITAMINB12", "FOLATE", "FERRITIN", "TIBC", "IRON", "RETICCTPCT" in the last 72 hours.   PHYSICAL EXAM General: Well developed, well nourished, in no acute distress HEENT:  Normocephalic and atramatic Neck:  No JVD.   Lungs: Clear bilaterally to auscultation and percussion. Heart: HRRR . Normal S1 and S2 without gallops or murmurs.  Abdomen: Bowel sounds are positive, abdomen soft and non-tender  Msk:  Back normal, normal gait. Normal strength and tone for age. Extremities: No clubbing, cyanosis or edema.   Neuro: Alert and oriented X 3. Psych:  Good affect, responds appropriately  TELEMETRY: Atrial fibrillation with controlled ventricular rate between 80 and 90  ASSESSMENT AND PLAN: Atrial fibrillation with controlled ventricular rate status post hemicolectomy due to adenocarcinoma of the colon.  Patient is feeling better with less shortness of breath but continues to have abdominal pain.  Active Problems:   Diabetes ([place])   Essential hypertension   Paroxysmal A-fib ([facility])   S/P right colectomy   S/P partial resection of colon   Hypotension   Hyponatremia   AKI (acute kidney injury) ([place])   Postoperative intra-abdominal abscess   Adenocarcinoma of colon (H[facility])   Persistent atrial fibrillation (H[facility])   Sepsis ([place])   Diabetic ketoacidosis without coma associated with type 2 diabetes mellitus ([place])   Generalized anxiety disorder    Lavance Beazer A, MD, [facility] 10/04/2022 1:08 PM

## 2022-10-04 NOTE — Consult Note (Signed)
ANTICOAGULATION CONSULT NOTE  Pharmacy Consult for Heparin Infusion Indication: atrial fibrillation  Patient Measurements: Height: '6\' 2"'$  (188 cm) Weight: (!) 138.9 kg (306 lb 3.5 oz) IBW/kg (Calculated) : 82.2 Heparin Dosing Weight: 107.3 kg  Labs: Recent Labs    10/02/22 0524 10/02/22 1341 10/03/22 0426 10/04/22 0538 10/04/22 1310 10/04/22 2001  HGB 7.7*  --  8.8* 7.8*  --   --   HCT 24.4*  --  27.8* 24.1*  --   --   PLT 261  --  324 367  --   --   HEPARINUNFRC 0.22*   < > 0.34 0.19* 0.28* 0.27*  CREATININE 3.07*  --  2.23* 1.67*  --   --    < > = values in this interval not displayed.     Estimated Creatinine Clearance: 64.6 mL/min (A) (by C-G formula based on SCr of 1.67 mg/dL (H)).   Medical History: Past Medical History:  Diagnosis Date   Adenocarcinoma of colon ([name]) 09/08/2022   Cholelithiasis    Chronic gastritis    Colon polyps    Coronary artery calcification seen on CT scan    Diastolic dysfunction    a.) TTE 04/09/2022: EF >55%, triv TR, G1DD   Diverticulosis    Hypertension    Long term current use of anticoagulant    a.) apixaban   Nephrolithiasis    Paroxysmal A-fib ([place])    a.) CHA2DS2VASc = 3 (age, HTN, T2DM);  b.) s/p DCCV 07/09/2016 (100 J x1), 12/22/2018 (120 J x1), 08/18/2021 (120 J x1), 03/11/2022 (120 J x1), 03/31/2022 (120 J x1); c.) rate/rhythm maintained on oral flecanide + metoprolol succinate; chronically anticoagulated with apixaban   T2DM (type 2 diabetes mellitus) ([place])    Assessment: Nicolas Zuniga is a 66 y.o. male presenting for laparoscopic R colectomy and cholecystectomy. PMH significant for recently diagnosed adenocarcinoma of the colon, DM, HTN, Afib on metoprolol, flecainide, and apixaban. Patient was on [facility] PTA per chart review, last dose of apixaban PTA reported to be on 10/16. Last enoxaparin dose 10/20 AM. Pharmacy has been consulted to initiate and manage heparin infusion.   10/29 0538 HL 0.19  10/29 1310 HL 0.28 10/29  2001 HL 0.27  Goal of Therapy:  Heparin level 0.3 - 0.5 units/ml (no bolus protocol) Monitor platelets by anticoagulation protocol: Yes   Plan:  Heparin level remains slightly subtherapeutic despite several rate increases. Suspect AT3 deficiency may be playing a role, will order AT3 level to assess. Will increase heparin infusion to 3400 units/hr. Heparin timed to stop 10/30 at 0200 per Dr. Dahlia Byes for upcoming procedure.   Thank you for allowing pharmacy to be a part of this patient's care.  Darnelle Bos, PharmD 10/04/2022 8:45 PM

## 2022-10-04 NOTE — Progress Notes (Signed)
2nd page sent out to Dr. Luther Bradley - surgery

## 2022-10-04 NOTE — Consult Note (Signed)
ANTICOAGULATION CONSULT NOTE  Pharmacy Consult for Heparin Infusion Indication: atrial fibrillation  Patient Measurements: Height: '6\' 2"'$  (188 cm) Weight: (!) 138.9 kg (306 lb 3.5 oz) IBW/kg (Calculated) : 82.2 Heparin Dosing Weight: 107.3 kg  Labs: Recent Labs    10/02/22 0524 10/02/22 1341 10/02/22 2151 10/03/22 0426 10/04/22 0538  HGB 7.7*  --   --  8.8* 7.8*  HCT 24.4*  --   --  27.8* 24.1*  PLT 261  --   --  324 367  HEPARINUNFRC 0.22*   < > 0.30 0.34 0.19*  CREATININE 3.07*  --   --  2.23* 1.67*   < > = values in this interval not displayed.     Estimated Creatinine Clearance: 64.6 mL/min (A) (by C-G formula based on SCr of 1.67 mg/dL (H)).   Medical History: Past Medical History:  Diagnosis Date   Adenocarcinoma of colon ([place]) 09/08/2022   Cholelithiasis    Chronic gastritis    Colon polyps    Coronary artery calcification seen on CT scan    Diastolic dysfunction    a.) TTE 04/09/2022: EF >55%, triv TR, G1DD   Diverticulosis    Hypertension    Long term current use of anticoagulant    a.) apixaban   Nephrolithiasis    Paroxysmal A-fib ([place])    a.) CHA2DS2VASc = 3 (age, HTN, T2DM);  b.) s/p DCCV 07/09/2016 (100 J x1), 12/22/2018 (120 J x1), 08/18/2021 (120 J x1), 03/11/2022 (120 J x1), 03/31/2022 (120 J x1); c.) rate/rhythm maintained on oral flecanide + metoprolol succinate; chronically anticoagulated with apixaban   T2DM (type 2 diabetes mellitus) ([place])    Assessment: Nicolas Zuniga is a 66 y.o. male presenting for laparoscopic R colectomy and cholecystectomy. PMH significant for recently diagnosed adenocarcinoma of the colon, DM, HTN, Afib on metoprolol, flecainide, and apixaban. Patient was on [facility] PTA per chart review, last dose of apixaban PTA reported to be on 10/16. Last enoxaparin dose 10/20 AM. Pharmacy has been consulted to initiate and manage heparin infusion.   Goal of Therapy:  Heparin level 0.3 - 0.5 units/ml (no bolus protocol) Monitor  platelets by anticoagulation protocol: Yes   Plan: heparin level subtherapeutic --Increase heparin rate to 3000 units/hr --Next heparin level in 6 hours --Daily CBC per protocol while on IV heparin  Thank you for allowing pharmacy to be a part of this patient's care.  Wynelle Cleveland 10/04/2022 6:23 AM

## 2022-10-04 NOTE — Consult Note (Signed)
ANTICOAGULATION CONSULT NOTE  Pharmacy Consult for Heparin Infusion Indication: atrial fibrillation  Patient Measurements: Height: '6\' 2"'$  (188 cm) Weight: (!) 138.9 kg (306 lb 3.5 oz) IBW/kg (Calculated) : 82.2 Heparin Dosing Weight: 107.3 kg  Labs: Recent Labs    10/02/22 0524 10/02/22 1341 10/03/22 0426 10/04/22 0538 10/04/22 1310  HGB 7.7*  --  8.8* 7.8*  --   HCT 24.4*  --  27.8* 24.1*  --   PLT 261  --  324 367  --   HEPARINUNFRC 0.22*   < > 0.34 0.19* 0.28*  CREATININE 3.07*  --  2.23* 1.67*  --    < > = values in this interval not displayed.     Estimated Creatinine Clearance: 64.6 mL/min (A) (by C-G formula based on SCr of 1.67 mg/dL (H)).   Medical History: Past Medical History:  Diagnosis Date   Adenocarcinoma of colon ([place]) 09/08/2022   Cholelithiasis    Chronic gastritis    Colon polyps    Coronary artery calcification seen on CT scan    Diastolic dysfunction    a.) TTE 04/09/2022: EF >55%, triv TR, G1DD   Diverticulosis    Hypertension    Long term current use of anticoagulant    a.) apixaban   Nephrolithiasis    Paroxysmal A-fib ([place])    a.) CHA2DS2VASc = 3 (age, HTN, T2DM);  b.) s/p DCCV 07/09/2016 (100 J x1), 12/22/2018 (120 J x1), 08/18/2021 (120 J x1), 03/11/2022 (120 J x1), 03/31/2022 (120 J x1); c.) rate/rhythm maintained on oral flecanide + metoprolol succinate; chronically anticoagulated with apixaban   T2DM (type 2 diabetes mellitus) ([place])    Assessment: Nicolas Zuniga is a 66 y.o. male presenting for laparoscopic R colectomy and cholecystectomy. PMH significant for recently diagnosed adenocarcinoma of the colon, DM, HTN, Afib on metoprolol, flecainide, and apixaban. Patient was on [facility] PTA per chart review, last dose of apixaban PTA reported to be on 10/16. Last enoxaparin dose 10/20 AM. Pharmacy has been consulted to initiate and manage heparin infusion.   10/29 0538 HL 0.19  10/29 1310 HL 0.28  Goal of Therapy:  Heparin level 0.3 - 0.5  units/ml (no bolus protocol) Monitor platelets by anticoagulation protocol: Yes   Plan:  Heparin level is slightly subtherapeutic. Will increase heparin infusion to 3200 units/hr. Recheck heparin level in 6 hours. CBC daily while on heparin.    Thank you for allowing pharmacy to be a part of this patient's care.  Oswald Hillock 10/04/2022 1:33 PM

## 2022-10-04 NOTE — Progress Notes (Signed)
       CROSS COVER NOTE  NAME: Nicolas Zuniga MRN: [id number] DOB : 07-Mar-1956       HPI/Events of Note   Patient reported to nurse that he felt he would like to talk to a therapist about his anxiety.   Assessment and  Interventions   Assessment:  Plan: Behavior health consulted - routine Chaplain spoke with patient briefly she did suggest palliative care consult       Kathlene Cote NP [facility]

## 2022-10-04 NOTE — Progress Notes (Signed)
Chaplain responded to request for emotional support to pt who is feeling overwhelmed and exhausted due to complications in his treatment and significant pain from surgeries.  Pt seemed breathless at times and struggled at times to express himself.  Chaplain provided compassionate listening as pt described feeling as if he were at the end of his rope and that he wondered if he should just give up.    Pt would benefit from a conversation regarding goals of care and further exploration of spiritual resources. Visit ended early as chaplain needed to step away.  Will refer pt for f/u visit in the morning.   Minus [place], [place] Pager:  [phone]    10/04/22 0300  Clinical Encounter Type  Visited With Patient and family together  Visit Type Initial;Psychological support  Referral From Nurse  Consult/Referral To Chaplain  Stress Factors  Patient Stress Factors Exhausted;Health changes  Family Stress Factors Family relationships

## 2022-10-04 NOTE — Progress Notes (Signed)
PT Cancellation Note  Patient Details Name: Nicolas Zuniga MRN: [id number] DOB: 1955/12/23   Cancelled Treatment:    Reason Eval/Treat Not Completed: Fatigue/lethargy limiting ability to participate  Discussed with RN prior to session.  Stated he sat up with him and surgeon EOB this am.  Pt in bed generally fatigued and not feeling well.  Stated sitting EOB was difficult this am and he did not feel up to trying again at this time.  Wife confirms.  Will continue tomorrow in hopes of being more successful.   Chesley Noon 10/04/2022, 11:17 AM

## 2022-10-04 NOTE — Progress Notes (Signed)
Increased output noted when emptying ileostomy pouch. Output bright red blood with clots and some dark stool = 325cc. Typical output has been 50-100cc dark stool q4h. Hospitalist notified. Surgery paged. VSS. New orders for labs received.

## 2022-10-04 NOTE — Progress Notes (Signed)
Chaplain followed up from yesterday's visit. Upon arrival [place] was lying in bed. Reported he felt like he was deep in sleep. Breathing was elevated. Chaplain offered him prayer which appeared to comfort him. Chaplain offered wife listening presence as she is concerned that hs is on anxiety mediations that may be to much. She is tearful when talking of his health status. She desires to connect with physicians for an update. Chaplain validated her many responsibilities including POA for [place] and working full time. Chaplain offered her prayer.      10/04/22 1600  Clinical Encounter Type  Visited With Patient and family together  Visit Type Follow-up  Referral From Chaplain

## 2022-10-04 NOTE — Progress Notes (Signed)
Blood noted via ileostomy.  Plan to stop heparin at 0200 for procedure, will stop heparin now for uncertain site of bleeding from ileostomy.   Labs in AM.

## 2022-10-05 ENCOUNTER — Inpatient Hospital Stay: Payer: Medicare Other

## 2022-10-05 ENCOUNTER — Encounter (INDEPENDENT_AMBULATORY_CARE_PROVIDER_SITE_OTHER): Payer: Self-pay

## 2022-10-05 ENCOUNTER — Encounter: Payer: Self-pay | Admitting: Surgery

## 2022-10-05 DIAGNOSIS — C189 Malignant neoplasm of colon, unspecified: Secondary | ICD-10-CM | POA: Diagnosis not present

## 2022-10-05 DIAGNOSIS — D509 Iron deficiency anemia, unspecified: Secondary | ICD-10-CM

## 2022-10-05 DIAGNOSIS — A419 Sepsis, unspecified organism: Secondary | ICD-10-CM | POA: Diagnosis not present

## 2022-10-05 DIAGNOSIS — I480 Paroxysmal atrial fibrillation: Secondary | ICD-10-CM

## 2022-10-05 DIAGNOSIS — F411 Generalized anxiety disorder: Secondary | ICD-10-CM

## 2022-10-05 DIAGNOSIS — J9601 Acute respiratory failure with hypoxia: Secondary | ICD-10-CM

## 2022-10-05 DIAGNOSIS — R531 Weakness: Secondary | ICD-10-CM

## 2022-10-05 DIAGNOSIS — R6521 Severe sepsis with septic shock: Secondary | ICD-10-CM

## 2022-10-05 DIAGNOSIS — N179 Acute kidney failure, unspecified: Secondary | ICD-10-CM | POA: Diagnosis not present

## 2022-10-05 LAB — BLOOD GAS, ARTERIAL
Acid-base deficit: 1 mmol/L (ref 0.0–2.0)
Acid-base deficit: 0.3 mmol/L (ref 0.0–2.0)
Bicarbonate: 27.4 mmol/L (ref 20.0–28.0)
Bicarbonate: 27.4 mmol/L (ref 20.0–28.0)
FIO2: 60 %
MECHVT: 500 mL
Mechanical Rate: 15
O2 Content: 4 L/min
O2 Saturation: 97.2 %
O2 Saturation: 98 %
PEEP: 5 cmH2O
Patient temperature: 37
Patient temperature: 37
pCO2 arterial: 61 mmHg — ABNORMAL HIGH (ref 32–48)
pCO2 arterial: 57 mmHg — ABNORMAL HIGH (ref 32–48)
pH, Arterial: 7.26 — ABNORMAL LOW (ref 7.35–7.45)
pH, Arterial: 7.29 — ABNORMAL LOW (ref 7.35–7.45)
pO2, Arterial: 82 mmHg — ABNORMAL LOW (ref 83–108)
pO2, Arterial: 90 mmHg (ref 83–108)

## 2022-10-05 LAB — CBC WITH DIFFERENTIAL/PLATELET
Abs Immature Granulocytes: 1.94 10*3/uL — ABNORMAL HIGH (ref 0.00–0.07)
Basophils Absolute: 0.1 10*3/uL (ref 0.0–0.1)
Basophils Relative: 0 %
Eosinophils Absolute: 0.2 10*3/uL (ref 0.0–0.5)
Eosinophils Relative: 1 %
HCT: 23.3 % — ABNORMAL LOW (ref 39.0–52.0)
Hemoglobin: 7.2 g/dL — ABNORMAL LOW (ref 13.0–17.0)
Immature Granulocytes: 6 %
Lymphocytes Relative: 7 %
Lymphs Abs: 2.4 10*3/uL (ref 0.7–4.0)
MCH: 24.1 pg — ABNORMAL LOW (ref 26.0–34.0)
MCHC: 30.9 g/dL (ref 30.0–36.0)
MCV: 77.9 fL — ABNORMAL LOW (ref 80.0–100.0)
Monocytes Absolute: 2.7 10*3/uL — ABNORMAL HIGH (ref 0.1–1.0)
Monocytes Relative: 8 %
Neutro Abs: 24.6 10*3/uL — ABNORMAL HIGH (ref 1.7–7.7)
Neutrophils Relative %: 78 %
Platelets: 405 10*3/uL — ABNORMAL HIGH (ref 150–400)
RBC: 2.99 MIL/uL — ABNORMAL LOW (ref 4.22–5.81)
RDW: 16.2 % — ABNORMAL HIGH (ref 11.5–15.5)
Smear Review: NORMAL
WBC: 31.9 10*3/uL — ABNORMAL HIGH (ref 4.0–10.5)
nRBC: 0.7 % — ABNORMAL HIGH (ref 0.0–0.2)

## 2022-10-05 LAB — BASIC METABOLIC PANEL
Anion gap: 5 (ref 5–15)
BUN: 31 mg/dL — ABNORMAL HIGH (ref 8–23)
CO2: 25 mmol/L (ref 22–32)
Calcium: 8.4 mg/dL — ABNORMAL LOW (ref 8.9–10.3)
Chloride: 106 mmol/L (ref 98–111)
Creatinine, Ser: 1.32 mg/dL — ABNORMAL HIGH (ref 0.61–1.24)
GFR, Estimated: 59 mL/min — ABNORMAL LOW (ref >60)
Glucose, Bld: 248 mg/dL — ABNORMAL HIGH (ref 70–99)
Potassium: 4.4 mmol/L (ref 3.5–5.1)
Sodium: 136 mmol/L (ref 135–145)

## 2022-10-05 LAB — MAGNESIUM
Magnesium: 1.8 mg/dL (ref 1.7–2.4)
Magnesium: 1.7 mg/dL (ref 1.7–2.4)

## 2022-10-05 LAB — COMPREHENSIVE METABOLIC PANEL
ALT: 10 U/L (ref 0–44)
ALT: 11 U/L (ref 0–44)
AST: 17 U/L (ref 15–41)
AST: 21 U/L (ref 15–41)
Albumin: 1.6 g/dL — ABNORMAL LOW (ref 3.5–5.0)
Albumin: 1.7 g/dL — ABNORMAL LOW (ref 3.5–5.0)
Alkaline Phosphatase: 59 U/L (ref 38–126)
Alkaline Phosphatase: 63 U/L (ref 38–126)
Anion gap: 2 — ABNORMAL LOW (ref 5–15)
Anion gap: 4 — ABNORMAL LOW (ref 5–15)
BUN: 31 mg/dL — ABNORMAL HIGH (ref 8–23)
BUN: 33 mg/dL — ABNORMAL HIGH (ref 8–23)
CO2: 26 mmol/L (ref 22–32)
CO2: 26 mmol/L (ref 22–32)
Calcium: 7.6 mg/dL — ABNORMAL LOW (ref 8.9–10.3)
Calcium: 8.2 mg/dL — ABNORMAL LOW (ref 8.9–10.3)
Chloride: 99 mmol/L (ref 98–111)
Chloride: 106 mmol/L (ref 98–111)
Creatinine, Ser: 1.24 mg/dL (ref 0.61–1.24)
Creatinine, Ser: 1.36 mg/dL — ABNORMAL HIGH (ref 0.61–1.24)
GFR, Estimated: >60 mL/min (ref >60)
GFR, Estimated: 57 mL/min — ABNORMAL LOW (ref >60)
Glucose, Bld: 446 mg/dL — ABNORMAL HIGH (ref 70–99)
Glucose, Bld: 246 mg/dL — ABNORMAL HIGH (ref 70–99)
Potassium: 5.3 mmol/L — ABNORMAL HIGH (ref 3.5–5.1)
Potassium: 5.6 mmol/L — ABNORMAL HIGH (ref 3.5–5.1)
Sodium: 127 mmol/L — ABNORMAL LOW (ref 135–145)
Sodium: 136 mmol/L (ref 135–145)
Total Bilirubin: 1.1 mg/dL (ref 0.3–1.2)
Total Bilirubin: 0.5 mg/dL (ref 0.3–1.2)
Total Protein: 5.7 g/dL — ABNORMAL LOW (ref 6.5–8.1)
Total Protein: 6.2 g/dL — ABNORMAL LOW (ref 6.5–8.1)

## 2022-10-05 LAB — PROTIME-INR
INR: 1.3 — ABNORMAL HIGH (ref 0.8–1.2)
Prothrombin Time: 16.5 seconds — ABNORMAL HIGH (ref 11.4–15.2)

## 2022-10-05 LAB — GLUCOSE, CAPILLARY
Glucose-Capillary: 173 mg/dL — ABNORMAL HIGH (ref 70–99)
Glucose-Capillary: 203 mg/dL — ABNORMAL HIGH (ref 70–99)
Glucose-Capillary: 243 mg/dL — ABNORMAL HIGH (ref 70–99)
Glucose-Capillary: 253 mg/dL — ABNORMAL HIGH (ref 70–99)
Glucose-Capillary: 256 mg/dL — ABNORMAL HIGH (ref 70–99)
Glucose-Capillary: 262 mg/dL — ABNORMAL HIGH (ref 70–99)
Glucose-Capillary: 275 mg/dL — ABNORMAL HIGH (ref 70–99)

## 2022-10-05 LAB — ANTITHROMBIN III: AntiThromb III Func: 104 % (ref 75–120)

## 2022-10-05 LAB — CBC
HCT: 23 % — ABNORMAL LOW (ref 39.0–52.0)
Hemoglobin: 7.4 g/dL — ABNORMAL LOW (ref 13.0–17.0)
MCH: 25.4 pg — ABNORMAL LOW (ref 26.0–34.0)
MCHC: 32.2 g/dL (ref 30.0–36.0)
MCV: 79 fL — ABNORMAL LOW (ref 80.0–100.0)
Platelets: 402 10*3/uL — ABNORMAL HIGH (ref 150–400)
RBC: 2.91 MIL/uL — ABNORMAL LOW (ref 4.22–5.81)
RDW: 16.6 % — ABNORMAL HIGH (ref 11.5–15.5)
WBC: 29.4 10*3/uL — ABNORMAL HIGH (ref 4.0–10.5)
nRBC: 1.5 % — ABNORMAL HIGH (ref 0.0–0.2)

## 2022-10-05 LAB — BETA-HYDROXYBUTYRIC ACID: Beta-Hydroxybutyric Acid: 0.14 mmol/L (ref 0.05–0.27)

## 2022-10-05 LAB — PREPARE RBC (CROSSMATCH)

## 2022-10-05 LAB — LACTIC ACID, PLASMA: Lactic Acid, Venous: 1.2 mmol/L (ref 0.5–1.9)

## 2022-10-05 LAB — PHOSPHORUS
Phosphorus: 3.1 mg/dL (ref 2.5–4.6)
Phosphorus: 3.8 mg/dL (ref 2.5–4.6)

## 2022-10-05 LAB — TROPONIN I (HIGH SENSITIVITY)
Troponin I (High Sensitivity): 23 ng/L — ABNORMAL HIGH (ref <18)
Troponin I (High Sensitivity): 26 ng/L — ABNORMAL HIGH (ref <18)

## 2022-10-05 LAB — TRIGLYCERIDES: Triglycerides: 138 mg/dL (ref <150)

## 2022-10-05 MED ORDER — ROCURONIUM BROMIDE 10 MG/ML (PF) SYRINGE: 50.0000 mg | PREFILLED_SYRINGE | Freq: Once | INTRAVENOUS | Status: DC

## 2022-10-05 MED ORDER — SODIUM CHLORIDE 0.9% FLUSH
5.0000 mL | Freq: Three times a day (TID) | INTRAVENOUS | Status: DC
  Administered 2022-10-05 – 2022-10-18 (×34): 5 mL

## 2022-10-05 MED ORDER — MIDAZOLAM HCL 2 MG/2ML IJ SOLN: 1.0000 mg | INTRAMUSCULAR | Status: DC | PRN

## 2022-10-05 MED ORDER — PROPOFOL 1000 MG/100ML IV EMUL
0.0000 ug/kg/min | INTRAVENOUS | Status: DC
  Administered 2022-10-06: 15 ug/kg/min via INTRAVENOUS
  Administered 2022-10-06: 20 ug/kg/min via INTRAVENOUS
  Filled 2022-10-05 (×2): qty 100

## 2022-10-05 MED ORDER — SODIUM CHLORIDE 0.9 % IV SOLN: 250.0000 mL | INTRAVENOUS | Status: DC

## 2022-10-05 MED ORDER — ETOMIDATE 2 MG/ML IV SOLN: 20.0000 mg | Freq: Once | INTRAVENOUS | Status: DC

## 2022-10-05 MED ORDER — FENTANYL CITRATE (PF) 100 MCG/2ML IJ SOLN
INTRAMUSCULAR | Status: AC
  Administered 2022-10-05: 100 ug
  Filled 2022-10-05: qty 2

## 2022-10-05 MED ORDER — FUROSEMIDE 10 MG/ML IJ SOLN
40.0000 mg | Freq: Two times a day (BID) | INTRAMUSCULAR | Status: AC
  Administered 2022-10-05: 40 mg via INTRAVENOUS
  Filled 2022-10-05: qty 4

## 2022-10-05 MED ORDER — ROCURONIUM BROMIDE 10 MG/ML (PF) SYRINGE
PREFILLED_SYRINGE | INTRAVENOUS | Status: AC
  Administered 2022-10-05: 50 mg
  Filled 2022-10-05: qty 20

## 2022-10-05 MED ORDER — FENTANYL 2500MCG IN NS 250ML (10MCG/ML) PREMIX INFUSION: 25.0000 ug/h | INTRAVENOUS | Status: DC

## 2022-10-05 MED ORDER — FENTANYL BOLUS VIA INFUSION
25.0000 ug | INTRAVENOUS | Status: DC | PRN
  Administered 2022-10-05 – 2022-10-06 (×2): 50 ug via INTRAVENOUS

## 2022-10-05 MED ORDER — PHENYLEPHRINE CONCENTRATED 100MG/250ML (0.4 MG/ML) INFUSION SIMPLE
0.0000 ug/min | INTRAVENOUS | Status: DC
  Administered 2022-10-05: 20 ug/min via INTRAVENOUS
  Filled 2022-10-05: qty 250

## 2022-10-05 MED ORDER — SODIUM CHLORIDE 0.9% IV SOLUTION: Freq: Once | INTRAVENOUS | Status: AC

## 2022-10-05 MED ORDER — NOREPINEPHRINE 4 MG/250ML-% IV SOLN
2.0000 ug/min | INTRAVENOUS | Status: DC
  Administered 2022-10-05: 10 ug/min via INTRAVENOUS
  Filled 2022-10-05: qty 250

## 2022-10-05 MED ORDER — FENTANYL CITRATE (PF) 100 MCG/2ML IJ SOLN
INTRAMUSCULAR | Status: AC | PRN
  Administered 2022-10-05 (×2): 50 ug via INTRAVENOUS

## 2022-10-05 MED ORDER — ETOMIDATE 2 MG/ML IV SOLN
INTRAVENOUS | Status: AC
  Administered 2022-10-05: 20 mg
  Filled 2022-10-05: qty 10

## 2022-10-05 MED ORDER — DEXTROSE 50 % IV SOLN
1.0000 | Freq: Once | INTRAVENOUS | Status: AC
  Administered 2022-10-05: 50 mL via INTRAVENOUS
  Filled 2022-10-05: qty 50

## 2022-10-05 MED ORDER — TRACE MINERALS CU-MN-SE-ZN 300-55-60-3000 MCG/ML IV SOLN
INTRAVENOUS | Status: DC
  Filled 2022-10-05: qty 964.8

## 2022-10-05 MED ORDER — MIDAZOLAM HCL 2 MG/2ML IJ SOLN
INTRAMUSCULAR | Status: AC
  Filled 2022-10-05: qty 2

## 2022-10-05 MED ORDER — PROPOFOL 1000 MG/100ML IV EMUL
INTRAVENOUS | Status: AC
  Administered 2022-10-05: 10 ug/kg/min via INTRAVENOUS
  Filled 2022-10-05: qty 100

## 2022-10-05 MED ORDER — MIDAZOLAM HCL 2 MG/2ML IJ SOLN
INTRAMUSCULAR | Status: AC | PRN
  Administered 2022-10-05 (×2): 1 mg via INTRAVENOUS

## 2022-10-05 MED ORDER — DOCUSATE SODIUM 50 MG/5ML PO LIQD
100.0000 mg | Freq: Two times a day (BID) | ORAL | Status: DC
  Administered 2022-10-05 – 2022-10-06 (×3): 100 mg
  Filled 2022-10-05 (×3): qty 10

## 2022-10-05 MED ORDER — POLYETHYLENE GLYCOL 3350 17 G PO PACK
17.0000 g | PACK | Freq: Every day | ORAL | Status: DC
  Administered 2022-10-05 – 2022-10-06 (×2): 17 g
  Filled 2022-10-05 (×2): qty 1

## 2022-10-05 MED ORDER — LIDOCAINE 1% INJECTION FOR CIRCUMCISION
10.0000 mL | INJECTION | Freq: Once | INTRAVENOUS | Status: AC
  Administered 2022-10-05: 10 mL via SUBCUTANEOUS

## 2022-10-05 MED ORDER — SODIUM CHLORIDE 0.9 % IV SOLN
100.0000 mg | INTRAVENOUS | Status: DC
  Administered 2022-10-05 – 2022-10-18 (×14): 100 mg via INTRAVENOUS
  Filled 2022-10-05 (×15): qty 5

## 2022-10-05 MED ORDER — FENTANYL CITRATE (PF) 100 MCG/2ML IJ SOLN
INTRAMUSCULAR | Status: AC
  Filled 2022-10-05: qty 2

## 2022-10-05 MED ORDER — INSULIN ASPART 100 UNIT/ML IV SOLN
10.0000 [IU] | Freq: Once | INTRAVENOUS | Status: AC
  Administered 2022-10-05: 10 [IU] via INTRAVENOUS
  Filled 2022-10-05: qty 0.1

## 2022-10-05 MED ORDER — FENTANYL CITRATE PF 50 MCG/ML IJ SOSY: 100.0000 ug | PREFILLED_SYRINGE | Freq: Once | INTRAMUSCULAR | Status: DC

## 2022-10-05 MED ORDER — FENTANYL 2500MCG IN NS 250ML (10MCG/ML) PREMIX INFUSION
INTRAVENOUS | Status: AC
  Administered 2022-10-05: 25 ug/h via INTRAVENOUS
  Filled 2022-10-05: qty 250

## 2022-10-05 MED ORDER — ALBUMIN HUMAN 25 % IV SOLN
25.0000 g | Freq: Four times a day (QID) | INTRAVENOUS | Status: AC
  Administered 2022-10-05 – 2022-10-06 (×3): 25 g via INTRAVENOUS
  Filled 2022-10-05 (×4): qty 100

## 2022-10-05 NOTE — Assessment & Plan Note (Signed)
Continue working with physical therapy

## 2022-10-05 NOTE — Consult Note (Signed)
Chief Complaint: Patient was seen in consultation today for  intra-abdominal fluid collections at the request of Edison Simon, [place]  Referring Physician(s): Edison Simon, [place]  Supervising Physician: Aletta Edouard  Patient Status: [name]  History of Present Illness: Nicolas Zuniga is a 66 y.o. male who is 7 days postop status post exploratory laparotomy, abdominal washout and diverting loop ileostomy for intra-abdominal, purulent fluid after open right colectomy with open cholecystectomy for colon cancer and cholelithiasis.  Due to persisting leukocytosis of 31.9 (34.7), patient has been referred to IR for management of intra-abdominal fluid collections. Procedure was approved by Dr. Kathlene Cote, IR for aspiration with possible drain placement of 2 intra-abdominal fluid collections.   Past Medical History:  Diagnosis Date   Adenocarcinoma of colon ([place]) 09/08/2022   Cholelithiasis    Chronic gastritis    Colon polyps    Coronary artery calcification seen on CT scan    Diastolic dysfunction    a.) TTE 04/09/2022: EF >55%, triv TR, G1DD   Diverticulosis    Hypertension    Long term current use of anticoagulant    a.) apixaban   Nephrolithiasis    Paroxysmal A-fib ([place])    a.) CHA2DS2VASc = 3 (age, HTN, T2DM);  b.) s/p DCCV 07/09/2016 (100 J x1), 12/22/2018 (120 J x1), 08/18/2021 (120 J x1), 03/11/2022 (120 J x1), 03/31/2022 (120 J x1); c.) rate/rhythm maintained on oral flecanide + metoprolol succinate; chronically anticoagulated with apixaban   T2DM (type 2 diabetes mellitus) ([place])     Past Surgical History:  Procedure Laterality Date   CARDIOVERSION N/A 12/22/2018   Procedure: CARDIOVERSION (CATH LAB);  Surgeon: Corey Skains, MD;  Location: [facility] ORS;  Service: Cardiovascular;  Laterality: N/A;   CARDIOVERSION N/A 08/18/2021   Procedure: CARDIOVERSION;  Surgeon: Corey Skains, MD;  Location: [facility] OR[facility];  Service: Cardiovascular;  Laterality: N/A;    CARDIOVERSION N/A 03/11/2022   Procedure: CARDIOVERSION;  Surgeon: Corey Skains, MD;  Location: [facility] ORS;  Service: Cardiovascular;  Laterality: N/A;   CARDIOVERSION N/A 03/31/2022   Procedure: CARDIOVERSION;  Surgeon: Corey Skains, MD;  Location: [facility] ORS;  Service: Cardiovascular;  Laterality: N/A;   CHOLECYSTECTOMY N/A 09/24/2022   Procedure: LAPAROSCOPIC CHOLECYSTECTOMY;  Surgeon: Jules Husbands, MD;  Location: [facility] ORS;  Service: General;  Laterality: N/A;   COLONOSCOPY WITH PROPOFOL N/A 09/08/2022   Procedure: COLONOSCOPY WITH PROPOFOL;  Surgeon: Lucilla Lame, MD;  Location: [facility] ENDOSCOPY;  Service: Endoscopy;  Laterality: N/A;   ELECTROPHYSIOLOGIC STUDY N/A 07/09/2016   Procedure: CARDIOVERSION;  Surgeon: Dionisio David, MD;  Location: [facility] ORS;  Service: Cardiovascular;  Laterality: N/A;   LAPAROSCOPIC RIGHT COLECTOMY N/A 09/24/2022   Procedure: LAPAROSCOPIC RIGHT COLECTOMY, RNFA to assist;  Surgeon: Jules Husbands, MD;  Location: [facility] ORS;  Service: General;  Laterality: N/A;   LAPAROTOMY N/A 09/28/2022   Procedure: EXPLORATORY LAPAROTOMY WITH OSTOMY CREATION;  Surgeon: Jules Husbands, MD;  Location: [facility] ORS;  Service: General;  Laterality: N/A;   TEE WITHOUT CARDIOVERSION N/A 07/09/2016   Procedure: TRANSESOPHAGEAL ECHOCARDIOGRAM (TEE);  Surgeon: Dionisio David, MD;  Location: [facility] ORS;  Service: Cardiovascular;  Laterality: N/A;   web fingers repaired     as a child    Allergies: Patient has no known allergies.  Medications: Prior to Admission medications   Medication Sig Start Date End Date Taking? Authorizing Provider  bisacodyl (DULCOLAX) 5 MG EC tablet Take all 4 tablets at 8 am the morning prior to your  surgery. 09/14/22  Yes Pabon, Diego F, MD  Cholecalciferol (VITAMIN D) 50 MCG (2000 UT) CAPS Take 2,000 Units by mouth daily.   Yes [provider]  flecainide (TAMBOCOR) 100 MG tablet Take 100 mg by mouth 2 (two) times daily. 03/17/22  Yes [provider]  glipiZIDE (GLUCOTROL XL) 10 MG 24 hr tablet Take 10 mg by mouth 2 (two) times daily.    Yes [provider]  metFORMIN (GLUCOPHAGE-XR) 500 MG 24 hr tablet Take 500 mg by mouth daily with breakfast. 02/10/21  Yes [provider]  metoprolol (LOPRESSOR) 100 MG tablet Take 100 mg by mouth 2 (two) times daily.   Yes [provider]  metroNIDAZOLE (FLAGYL) 500 MG tablet Take 2 tablets at 8AM, take 2 tablets at [facility], and take 2 tablets at 8PM the day prior to your surgery 09/14/22  Yes Pabon, Diego F, MD  Multiple Vitamins-Minerals (MULTIVITAMIN WITH MINERALS) tablet Take 1 tablet by mouth daily with breakfast.    Yes [provider]  neomycin (MYCIFRADIN) 500 MG tablet Take 2 tablet at 8am, take 2 tablets at 2pm, and take 2 tablets at 8pm the day prior to your surgery 09/14/22  Yes Pabon, Diego F, MD  Omega-3 Fatty Acids (FISH OIL) 1000 MG CAPS Take 1,000 mg by mouth daily.   Yes [provider]  OZEMPIC, 1 MG/DOSE, 4 MG/3ML SOPN Inject 1 mg into the skin every Wednesday. Patient stopped taking these medications 08/02/21  Yes [provider]  polyethylene glycol powder (MIRALAX) 17 GM/SCOOP powder Mix full container in 64 ounces of Gatorade or other clear liquid. NO Red 09/14/22  Yes Pabon, Rodey, MD  Turmeric 400 MG CAPS Take 400 mg by mouth daily.   Yes [provider]  vitamin B-12 (CYANOCOBALAMIN) 100 MCG tablet Take 500 mcg by mouth daily.   Yes [provider]  Vitamin E 400 units TABS Take 400 Units by mouth daily.   Yes [provider]  apixaban (ELIQUIS) 5 MG TABS tablet Take 5 mg by mouth 2 (two) times daily.    [provider]  Coenzyme Q10 (COQ10) 100 MG CAPS Take 100 mg by mouth daily.    [provider]     Family History  Problem Relation Age of Onset   Varicose Veins Mother    Heart disease Mother    Diabetes Mother    COPD Father     Social History   Socioeconomic  History   Marital status: Married    Spouse name: Not on file   Number of children: Not on file   Years of education: Not on file   Highest education level: Not on file  Occupational History   Not on file  Tobacco Use   Smoking status: Never    Passive exposure: Never   Smokeless tobacco: Never  Vaping Use   Vaping Use: Never used  Substance and Sexual Activity   Alcohol use: No   Drug use: No   Sexual activity: Not on file  Other Topics Concern   Not on file  Social History Narrative   Lives with wife, [name], with on pet, cat.   Social Determinants of Health   Financial Resource Strain: Not on file  Food Insecurity: No Food Insecurity (09/24/2022)   Hunger Vital Sign    Worried About Running Out of Food in the Last Year: Never true    Ran Out of Food in the Last Year: Never true  Transportation  Needs: No Transportation Needs (09/24/2022)   PRAPARE - Hydrologist (Medical): No    Lack of Transportation (Non-Medical): No  Physical Activity: Not on file  Stress: Not on file  Social Connections: Not on file    Review of Systems: A 12 point ROS discussed and pertinent positives are indicated in the HPI above.  All other systems are negative.  Review of Systems  Unable to perform ROS: Other (Pt too sleppy to answer questions.)  All other systems reviewed and are negative.   Vital Signs: BP 127/67 (BP Location: Left Arm)   Pulse 93   Temp 97.6 F (36.4 C)   Resp 20   Ht '6\' 2"'$  (1.88 m)   Wt 147 lb 14.9 oz (67.1 kg)   SpO2 93%   BMI 18.99 kg/m     Physical Exam Vitals reviewed.  Constitutional:      General: He is not in acute distress.    Appearance: He is ill-appearing.  HENT:     Head: Normocephalic and atraumatic.     Mouth/Throat:     Mouth: Mucous membranes are dry.     Pharynx: Oropharynx is clear.  Eyes:     Extraocular Movements: Extraocular movements intact.     Pupils: Pupils are equal, round, and reactive to  light.  Cardiovascular:     Rate and Rhythm: Normal rate. Rhythm irregular.     Pulses: Normal pulses.  Pulmonary:     Effort: Pulmonary effort is normal. No respiratory distress.     Breath sounds: Normal breath sounds.     Comments: Tachypnea  Abdominal:     General: There is no distension.     Palpations: Abdomen is soft.     Tenderness: There is abdominal tenderness. There is no guarding.     Comments: Colostomy to RLQ Midline incision dressed with surgical drain in place  Musculoskeletal:     Right lower leg: Edema present.     Left lower leg: Edema present.  Skin:    General: Skin is warm and dry.  Neurological:     Comments: Pt falls asleep during questioning   Psychiatric:        Mood and Affect: Mood normal.        Behavior: Behavior normal.     Imaging: CT ABDOMEN PELVIS WO CONTRAST  Result Date: 10/03/2022 CLINICAL DATA:  Abdominal pain. Postop leukocytosis. Status post right hemicolectomy for colon mass on 09/24/2022. Patient was re-explored 09/28/2022 and found to have purulent fluid in the right upper quadrant with possible Peri anastomotic abscess. EXAM: CT ABDOMEN AND PELVIS WITHOUT CONTRAST TECHNIQUE: Multidetector CT imaging of the abdomen and pelvis was performed following the standard protocol without IV contrast. RADIATION DOSE REDUCTION: This exam was performed according to the departmental dose-optimization program which includes automated exposure control, adjustment of the mA and/or kV according to patient size and/or use of iterative reconstruction technique. COMPARISON:  09/28/2022 FINDINGS: Lower chest: Dependent collapse/consolidation is seen in both lungs with small bilateral pleural effusions. Hepatobiliary: No suspicious focal abnormality in the liver on this study without intravenous contrast. Gallbladder surgically absent. No intrahepatic or extrahepatic biliary dilation. Pancreas: No focal mass lesion. No dilatation of the main duct. No  intraparenchymal cyst. No peripancreatic edema. Spleen: No splenomegaly. No focal mass lesion. Adrenals/Urinary Tract: No adrenal nodule or mass. Kidneys unremarkable. No evidence for hydroureter. Bladder is decompressed by Foley catheter. Gas in the bladder lumen is compatible with the instrumentation. Stomach/Bowel: Oral  contrast material is seen in the stomach. Duodenum is normally positioned as is the ligament of Treitz. No small bowel wall thickening. No small bowel dilatation. Loop ileostomy noted right abdomen. Right hemicolectomy. Transverse and left colon is opacified and decompressed. Surgical drain is identified in the right abdomen adjacent to the ileocolic anastomosis. Vascular/Lymphatic: There is mild atherosclerotic calcification of the abdominal aorta without aneurysm. There is no gastrohepatic or hepatoduodenal ligament lymphadenopathy. No retroperitoneal or mesenteric lymphadenopathy. No pelvic sidewall lymphadenopathy. Reproductive: The prostate gland and seminal vesicles are unremarkable. Other: Small volume free fluid is seen adjacent to the liver, similar to prior. Scattered collections of fluid are seen in the mesentery and bilateral paracolic gutters, also similar to prior. Volume of intraperitoneal gas has decreased in the interval. 14.7 x 2.0 cm relatively well-defined Jeani Hawking shaped collection of fluid is identified anterior abdomen (image 53/2). Open midline wound evident. Musculoskeletal: No worrisome lytic or sclerotic osseous abnormality. IMPRESSION: 1. Decreasing intraperitoneal free gas since 09/28/2022 as expected. Free fluid around the liver is similar to prior with multiple fluid collections in the mesentery in fluid pooling in the para colic gutters bilaterally. No definite abscess at this time although lack of intravenous contrast lessens sensitivity for detection. Superinfection of any of the intraperitoneal fluid collections cannot be excluded on this study. 2. Right abdominal  loop ileostomy in this patient with right hemicolectomy. 3. Dependent collapse/consolidation in both lungs with small bilateral pleural effusions. 4.  Aortic Atherosclerosis (ICD10-I70.0). Electronically Signed   By: Misty Stanley M.D.   On: 10/03/2022 15:48   ECHOCARDIOGRAM COMPLETE  Result Date: 09/29/2022    ECHOCARDIOGRAM REPORT   Patient Name:   NEFTALY SWISS Date of Exam: 09/29/2022 Medical Rec #:  [id number]     Height:       74.0 in Accession #:    [id number]    Weight:       260.1 lb Date of Birth:  09-17-1956      BSA:          2.430 m Patient Age:    26 years      BP:           126/74 mmHg Patient Gender: M             HR:           83 bpm. Exam Location:  [facility] Procedure: 2D Echo, Color Doppler and Cardiac Doppler Indications:     Abnormal ECG R94.31  History:         Patient has prior history of Echocardiogram examinations, most                  recent 07/09/2016. Risk Factors:Hypertension and Diabetes.                  Paroxysmal Afib.  Sonographer:     Sherrie Sport Referring Phys:  [id number] Teressa Lower Diagnosing Phys: Serafina Royals MD  Sonographer Comments: Echo performed with patient supine and on artificial respirator, Technically challenging study due to limited acoustic windows and no subcostal window. IMPRESSIONS  1. Left ventricular ejection fraction, by estimation, is 65 to 70%. The left ventricle has normal function. The left ventricle has no regional wall motion abnormalities. Left ventricular diastolic parameters were normal.  2. Right ventricular systolic function is normal. The right ventricular size is normal.  3. The mitral valve is normal in structure. Trivial mitral valve regurgitation.  4. The aortic valve is normal in structure. Aortic valve  regurgitation is not visualized. FINDINGS  Left Ventricle: Left ventricular ejection fraction, by estimation, is 65 to 70%. The left ventricle has normal function. The left ventricle has no regional wall motion abnormalities. The left  ventricular internal cavity size was small. There is no left ventricular hypertrophy. Left ventricular diastolic parameters were normal. Right Ventricle: The right ventricular size is normal. No increase in right ventricular wall thickness. Right ventricular systolic function is normal. Left Atrium: Left atrial size was normal in size. Right Atrium: Right atrial size was normal in size. Pericardium: There is no evidence of pericardial effusion. Mitral Valve: The mitral valve is normal in structure. Trivial mitral valve regurgitation. Tricuspid Valve: The tricuspid valve is normal in structure. Tricuspid valve regurgitation is trivial. Aortic Valve: The aortic valve is normal in structure. Aortic valve regurgitation is not visualized. Aortic valve mean gradient measures 2.0 mmHg. Aortic valve peak gradient measures 3.4 mmHg. Aortic valve area, by VTI measures 3.50 cm. Pulmonic Valve: The pulmonic valve was normal in structure. Pulmonic valve regurgitation is not visualized. Aorta: The aortic root and ascending aorta are structurally normal, with no evidence of dilitation. IAS/Shunts: No atrial level shunt detected by color flow Doppler.  LEFT VENTRICLE PLAX 2D LVIDd:         4.60 cm LVIDs:         3.30 cm LV PW:         1.20 cm LV IVS:        0.80 cm LVOT diam:     2.00 cm LV SV:         43 LV SV Index:   18 LVOT Area:     3.14 cm  RIGHT VENTRICLE RV Basal diam:  4.10 cm RV Mid diam:    3.80 cm RV S prime:     11.40 cm/s TAPSE (M-mode): 2.0 cm LEFT ATRIUM           Index        RIGHT ATRIUM           Index LA diam:      3.10 cm 1.28 cm/m   RA Area:     14.80 cm LA Vol (A2C): 40.0 ml 16.46 ml/m  RA Volume:   37.20 ml  15.31 ml/m LA Vol (A4C): 70.1 ml 28.84 ml/m  AORTIC VALVE AV Area (Vmax):    2.22 cm AV Area (Vmean):   2.37 cm AV Area (VTI):     3.50 cm AV Vmax:           91.60 cm/s AV Vmean:          67.100 cm/s AV VTI:            0.122 m AV Peak Grad:      3.4 mmHg AV Mean Grad:      2.0 mmHg LVOT Vmax:          64.70 cm/s LVOT Vmean:        50.700 cm/s LVOT VTI:          0.136 m LVOT/AV VTI ratio: 1.11  AORTA Ao Root diam: 3.20 cm MITRAL VALVE               TRICUSPID VALVE MV Area (PHT): 3.40 cm    TR Peak grad:   19.4 mmHg MV Decel Time: 223 msec    TR Vmax:        220.00 cm/s MV E velocity: 85.90 cm/s  SHUNTS                            Systemic VTI:  0.14 m                            Systemic Diam: 2.00 cm Serafina Royals MD Electronically signed by Serafina Royals MD Signature Date/Time: 09/29/2022/12:21:30 PM    Final    DG Abd 1 View  Result Date: 09/28/2022 CLINICAL DATA:  Enteric catheter placement, postop EXAM: ABDOMEN - 1 VIEW COMPARISON:  09/26/2022 FINDINGS: Frontal view of the lower chest and upper abdomen demonstrates enteric catheter passing below diaphragm, tip projecting over the gastric antrum. Left basilar consolidation compatible with atelectasis. Bowel gas pattern is unremarkable. IMPRESSION: 1. Enteric catheter tip projecting over the gastric antrum. Electronically Signed   By: Randa Ngo M.D.   On: 09/28/2022 15:52   DG Chest Port 1 View  Result Date: 09/28/2022 CLINICAL DATA:  Postop, intubated EXAM: PORTABLE CHEST 1 VIEW COMPARISON:  09/27/2022 FINDINGS: Single frontal view of the chest demonstrates endotracheal tube overlying tracheal air column tip at level of thoracic inlet. Enteric catheter passes below diaphragm tip excluded by collimation. Right-sided PICC tip overlies the right atrium. Numerous cardiac leads overlie the central chest. The cardiac silhouette is unremarkable. Lung volumes are diminished, with left basilar consolidation most consistent with atelectasis. Trace left pleural effusion. No pneumothorax. No acute bony abnormalities. IMPRESSION: 1. Support devices as above. 2. Increasing left basilar consolidation, favor atelectasis over pneumonia. 3. Trace left pleural effusion. Electronically Signed   By: Randa Ngo M.D.   On:  09/28/2022 15:51   CT ABDOMEN PELVIS WO CONTRAST  Result Date: 09/28/2022 CLINICAL DATA:  Status post right hemicolectomy 09/24/2022. Anastomotic complication suspected. EXAM: CT ABDOMEN AND PELVIS WITHOUT CONTRAST TECHNIQUE: Multidetector CT imaging of the abdomen and pelvis was performed following the standard protocol without IV contrast. RADIATION DOSE REDUCTION: This exam was performed according to the departmental dose-optimization program which includes automated exposure control, adjustment of the mA and/or kV according to patient size and/or use of iterative reconstruction technique. COMPARISON:  09/27/2022 FINDINGS: Lower chest: Bibasilar atelectasis noted with tiny bilateral pleural effusions. Hepatobiliary: No suspicious focal abnormality in the liver on this study without intravenous contrast. Gallbladder is surgically absent. No intrahepatic or extrahepatic biliary dilation. Pancreas: No focal mass lesion. No dilatation of the main duct. No intraparenchymal cyst. No peripancreatic edema. Spleen: No splenomegaly. No focal mass lesion. Adrenals/Urinary Tract: No adrenal nodule or mass. Contrast in the kidneys compatible with residual from yesterday's infuse scan. Small cyst noted left kidney. No evidence for hydroureter. The urinary bladder appears normal for the degree of distention. Stomach/Bowel: Stomach is distended with gas and fluid. Duodenum is normally positioned as is the ligament of Treitz. No small bowel wall thickening. No small bowel dilatation. The distal ileum at the ileocolic anastomosis in the proximal transverse colon at the anastomosis is well opacified with enteric contrast material. While there is no evidence for gross contrast extravasation at the level of the anastomosis, the small fluid collection identified on yesterday's study has increased in attenuation in the interval (26 Hounsfield units previously versus 67 Hounsfield units today). This increase in attenuation is  concerning for some trace spill of contrast from the anastomosis into the collection although blood products or infectious debris could cause increase in attenuation. This finding is associated with a slight increase in  fluid volume in the right paracolic gutter. Diverticular changes are seen in the left colon as before. Vascular/Lymphatic: There is mild atherosclerotic calcification of the abdominal aorta without aneurysm. There is no gastrohepatic or hepatoduodenal ligament lymphadenopathy. No retroperitoneal or mesenteric lymphadenopathy. No pelvic sidewall lymphadenopathy. Reproductive: The hypoattenuating collection seen previously in the prostate gland are less prominent today. Other: Free fluid and free gas in the peritoneal cavity is again noted. Free gas is not unexpected 4 days out from surgery. As noted above, the volume of fluid in the right paracolic gutter is slightly increased in the interval. Musculoskeletal: Gas in the midline subcutaneous tissues deep to the staple line has increased in the interval No worrisome lytic or sclerotic osseous abnormality. IMPRESSION: 1. While there is no evidence for gross enteric contrast extravasation at the level of the ileocolic anastomosis, the small fluid collection adjacent to the anastomosis identified on yesterday's study has increased in attenuation in the interval. This increase in attenuation is concerning for some trace spill of contrast from the anastomosis into the collection. 2. Slight increase in fluid volume in the right paracolic gutter with similar fluid volume around the liver and in the left paracolic gutter. Collections of interloop mesenteric fluid are not substantially changed. 3. Free gas in the peritoneal cavity is not unexpected 4 days out from surgery. 4. Gas in the midline subcutaneous tissues deep to the staple line has increased in the interval. 5. Contrast in the kidneys compatible with residual from yesterday's infused scan. 6.  Bibasilar atelectasis with tiny bilateral pleural effusions. 7. Aortic Atherosclerosis (ICD10-I70.0). Findings were Dr. Dahlia Byes at approximately 1105 hours on 09/28/2022. Electronically Signed   By: Misty Stanley M.D.   On: 09/28/2022 11:22   DG Chest Port 1 View  Result Date: 09/27/2022 CLINICAL DATA:  PICC line placement. EXAM: PORTABLE CHEST 1 VIEW COMPARISON:  Abdominal series 09/26/2022. CT abdomen and pelvis 09/27/2022 FINDINGS: Shallow inspiration with atelectasis in the lung bases. Cardiac enlargement. No airspace disease or consolidation in the lungs. A right PICC line has been placed with tip over the cavoatrial junction. No pneumothorax. Pneumoperitoneum is demonstrated below the right hemidiaphragm as seen on prior studies, likely postoperative. IMPRESSION: 1. Right PICC line appears in satisfactory position. 2. Shallow inspiration with atelectasis in the lung bases. Cardiac enlargement. 3. Pneumoperitoneum as seen on prior studies, probably postoperative. Electronically Signed   By: Lucienne Capers M.D.   On: 09/27/2022 19:25   [place] EKG SITE RITE  Result Date: 09/27/2022 If Site Rite image not attached, placement could not be confirmed due to current cardiac rhythm.  CT ABDOMEN PELVIS W CONTRAST  Result Date: 09/27/2022 CLINICAL DATA:  66 year old male with RIGHT colectomy for adenocarcinoma and cholecystectomy performed on 09/24/2022. Hypotension and abdominal discomfort. EXAM: CT ABDOMEN AND PELVIS WITH CONTRAST TECHNIQUE: Multidetector CT imaging of the abdomen and pelvis was performed using the standard protocol following bolus administration of intravenous contrast. RADIATION DOSE REDUCTION: This exam was performed according to the departmental dose-optimization program which includes automated exposure control, adjustment of the mA and/or kV according to patient size and/or use of iterative reconstruction technique. CONTRAST:  147m OMNIPAQUE IOHEXOL 350 MG/ML SOLN COMPARISON:   09/17/2022 CT FINDINGS: Lower chest: Trace bilateral pleural effusions and bibasilar atelectasis noted. Hepatobiliary: The liver is unremarkable. The patient is status post cholecystectomy. There is no evidence of intrahepatic or extrahepatic biliary dilatation. Pancreas: A small amount of ill-defined fluid anterior to the pancreas is noted but the remainder of the pancreas is  unremarkable. Spleen: Unremarkable Adrenals/Urinary Tract: No significant abnormalities of the kidneys, adrenal glands or bladder noted. No evidence of hydronephrosis. Stomach/Bowel: RIGHT colectomy and ileo colonic anastomosis noted. There is a small amount of ill-defined high density fluid along the suture line (images 35-39: Series 4), which may represent a small amount of extravasated contrast/leak. There is a small amount of ascites within the abdomen and pelvis with small to moderate amount of pneumoperitoneum within the abdomen. There is no evidence of focal collection/defined abscess. There is no evidence of bowel obstruction. No definite bowel wall thickening is identified. Vascular/Lymphatic: Aortic atherosclerosis. No enlarged abdominal or pelvic lymph nodes. Reproductive: 2 low-density areas within the prostate gland are noted, measuring 1.7 cm on the LEFT and 1 cm on the RIGHT. Other: Ill-defined stranding within the abdomen and pelvis noted likely recent surgical changes. Musculoskeletal: No acute or suspicious bony abnormalities are identified. IMPRESSION: 1. Ill-defined slightly high density fluid along the ileocolic suture line, which may represent a small amount of extravasated contrast/leak. Small to moderate amount of pneumoperitoneum and small amount of ascites within the abdomen and pelvis, not unexpected given surgery but nonspecific. No evidence of focal collection/defined abscess. 2. Trace bilateral pleural effusions and bibasilar atelectasis. 3. 2 low-density areas within the prostate gland, nonspecific but correlate  with PSA. 4.  Aortic Atherosclerosis (ICD10-I70.0). Critical Value/emergent results discussed by phone at the time of interpretation on 09/27/2022 at 1:50 pm to provider DIEGO PABON , who verbally acknowledged these results. Electronically Signed   By: Margarette Canada M.D.   On: 09/27/2022 13:51   CT Angio Chest Pulmonary Embolism (PE) W or WO Contrast  Result Date: 09/27/2022 CLINICAL DATA:  Shortness of breath. EXAM: CT ANGIOGRAPHY CHEST WITH CONTRAST TECHNIQUE: Multidetector CT imaging of the chest was performed using the standard protocol during bolus administration of intravenous contrast. Multiplanar CT image reconstructions and MIPs were obtained to evaluate the vascular anatomy. RADIATION DOSE REDUCTION: This exam was performed according to the departmental dose-optimization program which includes automated exposure control, adjustment of the mA and/or kV according to patient size and/or use of iterative reconstruction technique. CONTRAST:  130m OMNIPAQUE IOHEXOL 350 MG/ML SOLN COMPARISON:  September 17, 2022. FINDINGS: Cardiovascular: Satisfactory opacification of the pulmonary arteries to the segmental level. No evidence of pulmonary embolism. Normal heart size. No pericardial effusion. Mediastinum/Nodes: No enlarged mediastinal, hilar, or axillary lymph nodes. Thyroid gland, trachea, and esophagus demonstrate no significant findings. Lungs/Pleura: No pneumothorax is noted. Minimal bilateral pleural effusions are noted with adjacent subsegmental atelectasis. Upper Abdomen: Pneumoperitoneum is noted in the visualized portion of upper abdomen consistent with the reported history of recent laparoscopic right colectomy and cholecystectomy. Musculoskeletal: No chest wall abnormality. No acute or significant osseous findings. Review of the MIP images confirms the above findings. IMPRESSION: No definite evidence of pulmonary embolus. Minimal bilateral pleural effusions are noted with adjacent subsegmental  atelectasis. Pneumoperitoneum is noted in visualized portion of upper abdomen consistent with reported history of recent laparoscopic right colectomy and cholecystectomy. Electronically Signed   By: JMarijo ConceptionM.D.   On: 09/27/2022 13:38   DG ABD ACUTE 2+V W 1V CHEST  Result Date: 09/26/2022 CLINICAL DATA:  Ileus following open right colectomy on 09/24/2022 EXAM: DG ABDOMEN ACUTE WITH 1 VIEW CHEST COMPARISON:  09/17/2022 FINDINGS: Gaseous distension of the stomach. Air is seen within the colon. No dilated loops of small bowel. Small-moderate volume pneumoperitoneum. Numerous surgical clips within the right hemiabdomen. Midline abdominal skin staples. Heart size within normal limits.  Low lung volumes. Trace left pleural effusion with left basilar atelectasis. No pneumothorax. IMPRESSION: 1. Small-moderate volume pneumoperitoneum, likely related to recent surgery. 2. Gaseous distension of the stomach. No dilated loops of small bowel to suggest obstruction. Electronically Signed   By: Davina Poke D.O.   On: 09/26/2022 10:57   CT Chest W Contrast  Result Date: 09/18/2022 CLINICAL DATA:  Colon cancer diagnosed on 09/08/2022. Elevated CEA. Staging evaluation. EXAM: CT CHEST, ABDOMEN, AND PELVIS WITH CONTRAST TECHNIQUE: Multidetector CT imaging of the chest, abdomen and pelvis was performed following the standard protocol during bolus administration of intravenous contrast. RADIATION DOSE REDUCTION: This exam was performed according to the departmental dose-optimization program which includes automated exposure control, adjustment of the mA and/or kV according to patient size and/or use of iterative reconstruction technique. CONTRAST:  165m OMNIPAQUE IOHEXOL 300 MG/ML  SOLN COMPARISON:  Abdomen and pelvis CT dated 03/07/2020 FINDINGS: CT CHEST FINDINGS Cardiovascular: Minimal coronary artery calcification. Normal sized heart. Mediastinum/Nodes: No enlarged mediastinal, hilar, or axillary lymph nodes.  Thyroid gland, trachea, and esophagus demonstrate no significant findings. Lungs/Pleura: Minimal linear atelectasis or scarring at the left lung base. 5 x 3 mm left lower lobe nodule with a mean diameter of 4 mm on image number 100/5. Taking differences in slice thickness into account, this demonstrates little if any change since 03/07/2020. There is also a small calcified granuloma more superiorly in the left lower lobe on image number 77/5. On image number 97/5 and coronal image number 58/6, there is a 4 x 2 mm oval nodular density with a mean diameter of 3 mm. It is difficult determine if this is a lung nodule or prominent vessel branch point. No other lung nodules seen. No pleural fluid. Musculoskeletal: Thoracic and lower cervical spine degenerative changes. No evidence of bony metastatic disease. CT ABDOMEN PELVIS FINDINGS Hepatobiliary: Multiple small noncalcified gallstones in the dependent portion of the gallbladder measuring up 5 mm in maximum diameter each. No gallbladder wall thickening or pericholecystic fluid. Unremarkable liver. No liver masses are seen. Pancreas: Unremarkable. No pancreatic ductal dilatation or surrounding inflammatory changes. Spleen: Normal in size without focal abnormality. Adrenals/Urinary Tract: Normal appearing adrenal glands. Small bilateral renal cysts. These do not need imaging follow-up. Poorly distended urinary bladder. No ureteral abnormalities seen. Stomach/Bowel: Interval concentric mass involving the right colon and ileocecal valve measuring 7.3 cm in length on coronal image number 88/6 with a maximum wall thickness of 1.7 cm on the same image. This mass is confluent with an immediately adjacent mass medially with central low density, measuring 3.8 x 3.0 cm on image number 92/3 and 4.1 cm in length on coronal image number 83/6. There are multiple adjacent lymph node medial to these masses. The largest has a short axis diameter of 2.4 cm on image number 86/3. Multiple  descending and sigmoid colon diverticula. Mild wall thickening involving the terminal ileum at the ileocecal valve. The remainder of the small bowel is unremarkable. Normal-appearing appendix and stomach. Vascular/Lymphatic: Mild atheromatous arterial calcifications without aneurysm. Multiple enlarged mesenteric lymph nodes medial to the right colon mass, as described above. No other enlarged lymph nodes seen. Reproductive: Prostate is unremarkable. Other: No abdominal wall hernia or abnormality. No abdominopelvic ascites. Musculoskeletal: Lumbar spine degenerative changes. Mild bilateral hip degenerative changes. No evidence of bony metastatic disease. IMPRESSION: 1. Interval concentric mass involving the right colon, ileocecal valve and distal terminal ileum, compatible with the patient's known colon carcinoma. No associated obstruction. 2. Adjacent mass with central low density,  compatible with local extension of necrotic tumor or an enlarged, necrotic metastatic lymph node. 3. Multiple adjacent enlarged mesenteric lymph nodes medial to these masses, compatible with metastatic adenopathy. 4. Colonic diverticulosis. 5. Cholelithiasis. 6. 4 mm mean diameter left lower lobe nodule possible 3 mm mean diameter right middle lobe nodule. These are felt to have a low likelihood of representing metastatic disease and could be followed on subsequent examinations. Electronically Signed   By: Claudie Revering M.D.   On: 09/18/2022 17:04   CT Abdomen Pelvis W Contrast  Result Date: 09/18/2022 CLINICAL DATA:  Colon cancer diagnosed on 09/08/2022. Elevated CEA. Staging evaluation. EXAM: CT CHEST, ABDOMEN, AND PELVIS WITH CONTRAST TECHNIQUE: Multidetector CT imaging of the chest, abdomen and pelvis was performed following the standard protocol during bolus administration of intravenous contrast. RADIATION DOSE REDUCTION: This exam was performed according to the departmental dose-optimization program which includes automated  exposure control, adjustment of the mA and/or kV according to patient size and/or use of iterative reconstruction technique. CONTRAST:  122m OMNIPAQUE IOHEXOL 300 MG/ML  SOLN COMPARISON:  Abdomen and pelvis CT dated 03/07/2020 FINDINGS: CT CHEST FINDINGS Cardiovascular: Minimal coronary artery calcification. Normal sized heart. Mediastinum/Nodes: No enlarged mediastinal, hilar, or axillary lymph nodes. Thyroid gland, trachea, and esophagus demonstrate no significant findings. Lungs/Pleura: Minimal linear atelectasis or scarring at the left lung base. 5 x 3 mm left lower lobe nodule with a mean diameter of 4 mm on image number 100/5. Taking differences in slice thickness into account, this demonstrates little if any change since 03/07/2020. There is also a small calcified granuloma more superiorly in the left lower lobe on image number 77/5. On image number 97/5 and coronal image number 58/6, there is a 4 x 2 mm oval nodular density with a mean diameter of 3 mm. It is difficult determine if this is a lung nodule or prominent vessel branch point. No other lung nodules seen. No pleural fluid. Musculoskeletal: Thoracic and lower cervical spine degenerative changes. No evidence of bony metastatic disease. CT ABDOMEN PELVIS FINDINGS Hepatobiliary: Multiple small noncalcified gallstones in the dependent portion of the gallbladder measuring up 5 mm in maximum diameter each. No gallbladder wall thickening or pericholecystic fluid. Unremarkable liver. No liver masses are seen. Pancreas: Unremarkable. No pancreatic ductal dilatation or surrounding inflammatory changes. Spleen: Normal in size without focal abnormality. Adrenals/Urinary Tract: Normal appearing adrenal glands. Small bilateral renal cysts. These do not need imaging follow-up. Poorly distended urinary bladder. No ureteral abnormalities seen. Stomach/Bowel: Interval concentric mass involving the right colon and ileocecal valve measuring 7.3 cm in length on coronal  image number 88/6 with a maximum wall thickness of 1.7 cm on the same image. This mass is confluent with an immediately adjacent mass medially with central low density, measuring 3.8 x 3.0 cm on image number 92/3 and 4.1 cm in length on coronal image number 83/6. There are multiple adjacent lymph node medial to these masses. The largest has a short axis diameter of 2.4 cm on image number 86/3. Multiple descending and sigmoid colon diverticula. Mild wall thickening involving the terminal ileum at the ileocecal valve. The remainder of the small bowel is unremarkable. Normal-appearing appendix and stomach. Vascular/Lymphatic: Mild atheromatous arterial calcifications without aneurysm. Multiple enlarged mesenteric lymph nodes medial to the right colon mass, as described above. No other enlarged lymph nodes seen. Reproductive: Prostate is unremarkable. Other: No abdominal wall hernia or abnormality. No abdominopelvic ascites. Musculoskeletal: Lumbar spine degenerative changes. Mild bilateral hip degenerative changes. No evidence of bony  metastatic disease. IMPRESSION: 1. Interval concentric mass involving the right colon, ileocecal valve and distal terminal ileum, compatible with the patient's known colon carcinoma. No associated obstruction. 2. Adjacent mass with central low density, compatible with local extension of necrotic tumor or an enlarged, necrotic metastatic lymph node. 3. Multiple adjacent enlarged mesenteric lymph nodes medial to these masses, compatible with metastatic adenopathy. 4. Colonic diverticulosis. 5. Cholelithiasis. 6. 4 mm mean diameter left lower lobe nodule possible 3 mm mean diameter right middle lobe nodule. These are felt to have a low likelihood of representing metastatic disease and could be followed on subsequent examinations. Electronically Signed   By: Claudie Revering M.D.   On: 09/18/2022 17:04    Labs:  CBC: Recent Labs    10/03/22 0426 10/04/22 0538 10/04/22 2200 10/05/22 0440   WBC 35.5* 36.9* 34.7* 31.9*  HGB 8.8* 7.8* 8.0* 7.2*  HCT 27.8* 24.1* 26.0* 23.3*  PLT 324 367 388 405*    COAGS: Recent Labs    09/14/22 1455 09/27/22 1222 09/28/22 0458 10/05/22 0440  INR 1.2 2.0* 2.1* 1.3*  APTT  --  44*  --   --     BMP: Recent Labs    10/02/22 0524 10/03/22 0426 10/04/22 0538 10/05/22 0440  NA 133* 136 134* 136  K 3.8 4.2 4.1 4.4  CL 102 105 102 106  CO2 20* '25 23 25  '$ GLUCOSE 284* 283* 313* 248*  BUN 48* 38* 33* 31*  CALCIUM 8.3* 8.4* 8.4* 8.4*  CREATININE 3.07* 2.23* 1.67* 1.32*  GFRNONAA 22* 32* 45* 59*    LIVER FUNCTION TESTS: Recent Labs    09/14/22 1455 09/26/22 0412 09/26/22 2311 09/28/22 1449  BILITOT 0.7 0.5 0.9 0.8  AST 21 29 32 22  ALT '15 16 14 10  '$ ALKPHOS 75 43 40 50  PROT 7.6 5.4* 6.0* 4.4*  ALBUMIN 3.8 2.7* 2.9* 2.2*    TUMOR MARKERS: No results for input(s): "AFPTM", "CEA", "CA199", "CHROMGRNA" in the last 8760 hours.  Assessment and Plan: 66 year old male with history of right colon cancer status post laparoscopic right colectomy and cholecystectomy presents to IR for aspiration/drain placement for postop intra-abdominal fluid collections.  Pt resting in bed. He responds to verbal stimuli but is unable to remain awake to answer questions.  He is in no distress.  He is NPO per wife.  No thinners noted.   Risks and benefits discussed with the patient including bleeding, infection, damage to adjacent structures, bowel perforation/fistula connection, and sepsis.  All of the patient's questions were answered, patient is agreeable to proceed. Consent signed and in chart.   Thank you for this interesting consult.  I greatly enjoyed meeting DIGBY GROENEVELD and look forward to participating in their care.  A copy of this report was sent to the requesting provider on this date.  Electronically Signed: Tyson Alias, NP 10/05/2022, 10:12 AM   I spent a total of 20 minutes in face to face in clinical consultation,  greater than 50% of which was counseling/coordinating care for intra-abdominal fluid collections.

## 2022-10-05 NOTE — Progress Notes (Signed)
Inpatient Rehab Admissions Coordinator:    Patient having additional procedure today. Not medically ready at this time for CIR admission. Will continue to follow.   Rehab Admissons Coordinator [place], [place], [place] [phone]

## 2022-10-05 NOTE — Progress Notes (Signed)
Nutrition Follow-up  DOCUMENTATION CODES:   Obesity unspecified  INTERVENTION:   -TPN management per pharmacy -Magic cup TID with meals, each supplement provides 290 kcal and 9 grams of protein  -RD will follow for diet advancement and adjust care plan as appropriate -If oral intake remains poor, consider placement of NGT (discussed with MD and general surgery, who will re-evaluate tomorrow):  Initiate Osmolite 1.5 @ 20 ml/hr and increase by 10 ml every 12 hours to goal rate of 65 ml/hr.   61 ml Prosource TF BID.    Tube feeding regimen provides 2500 kcal (100% of needs), 138 grams of protein, and 1189 ml of H2O.    NUTRITION DIAGNOSIS:   Increased nutrient needs related to post-op healing as evidenced by estimated needs.  Ongoing  GOAL:   Patient will meet greater than or equal to 90% of their needs  Met with TPN  MONITOR:   Diet advancement, Labs, Weight trends, Skin, I & O's  REASON FOR ASSESSMENT:   Rounds    ASSESSMENT:   66 y/o male with h/o DM, HTN, Afib s/p cardioversion, anxiety, HLD, hiatal hernia, cholelithiasis and colon cancer s/p open right colectomy with ileocolic anastomosis and cholecystectomy 62/03 complicated by anastamotic leak s/p exploratory laparotomy and abdominal washout and diverting loop ileostomy 10/23. Pt also with new DKA and AKI.  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 He remains on TPN for nutritional support. Pt remains at goal rate of TPN (90 ml/hr), which provides 2567 kcals and 145 grams protein, meeting 100% of estimated nutritional needs. Discussed with hospitalist and general surgery regarding possibility of placing NGT for nutritional support; per hospitalist, will re-evaluate tomorrow and discuss with general surgery.   Per TOC notes, plan for SNF vs CIR.   Labs reviewed: CBGS: [id number] (inpatient orders for glycemic control are 25 units insulin regular in TPN bag, 5 units insulin aspart every 4 hours, and 15 units insulin detemir BID).    Diet Order:   Diet Order             Diet clear liquid Room service appropriate? Yes; Fluid consistency: Thin  Diet effective now                   EDUCATION NEEDS:   No education needs have been identified at this time  Skin:  Skin Assessment: Skin Integrity Issues: Skin Integrity Issues:: DTI DTI: lt nare Incisions: closed abdomen  Last BM:  10/02/22 (50 ml via ileostomy)  Height:   Ht Readings from Last 1 Encounters:  09/28/22 _0  (1.88 m)    Weight:   Wt Readings from Last 1 Encounters:  10/05/22 67.1 kg  Ideal Body Weight:  86.4 kg  BMI:  Body mass index is 18.99 kg/m.  Estimated Nutritional Needs:   Kcal:  2334-3568  Protein:  135-150 grams  Fluid:  > 2 L    Loistine Chance, RD, LDN, [place] Registered Dietitian II Certified Diabetes Care and Education Specialist Please refer to [facility] for RD and/or RD on-call/weekend/after hours pager

## 2022-10-05 NOTE — Assessment & Plan Note (Addendum)
Septic shock has resolved.  Off pressors at this point.  Still on meropenem.

## 2022-10-05 NOTE — Progress Notes (Signed)
Occupational Therapy Treatment Patient Details Name: Nicolas Zuniga MRN: [id number] DOB: 1956/02/09 Today's Date: 10/05/2022   History of present illness 65 y.o. male Post-Op s/p exploratory laparotomy, abdominal washout, and diverting loop ileostomy for intra-abdominal purulent fluid after initial open right colectomy with open cholecystectomy for right colon cancer and cholelithiasis on 10/19   OT comments  Nicolas Zuniga was seen for OT treatment on this date. Upon arrival to room pt reclined in bed sleeping, wife at bed side, pt agreeable to tx. Pt requires MAX A exit bed, initial sitting assist improving to SBA for dressing and therex (as described below). MOD A don/doff gown seated EOB - requires BUE support static sitting. MAX A don B socks seated EOB. Pt making limited progress toward goals, will continue to follow POC. Discharge recommendation remains appropriate however will continue to assess pts tolerance and mobility next session.     Recommendations for follow up therapy are one component of a multi-disciplinary discharge planning process, led by the attending physician.  Recommendations may be updated based on patient status, additional functional criteria and insurance authorization.    Follow Up Recommendations  Acute inpatient rehab (3hours/day) (needs to demonstrate improved tolerance)    Assistance Recommended at Discharge Frequent or constant Supervision/Assistance  Patient can return home with the following  Two people to help with walking and/or transfers;Two people to help with bathing/dressing/bathroom;Help with stairs or ramp for entrance   Equipment Recommendations  Other (comment) (defer)    Recommendations for Other Services      Precautions / Restrictions Precautions Precautions: Fall Restrictions Weight Bearing Restrictions: No       Mobility Bed Mobility Overal bed mobility: Needs Assistance Bed Mobility: Supine to Sit, Sit to Supine     Supine to sit:  Max assist Sit to supine: Mod assist        Transfers              Deferred citing pain            Balance Overall balance assessment: Needs assistance Sitting-balance support: Bilateral upper extremity supported, Feet supported Sitting balance-Leahy Scale: Fair Sitting balance - Comments: initial MOD A improves to SBA                                   ADL either performed or assessed with clinical judgement   ADL Overall ADL's : Needs assistance/impaired                                       General ADL Comments: MOD A don/doff gown seated EOB - requires BUE support static sitting. MAX A don B socks seated EOB.      Cognition Arousal/Alertness: Awake/alert Behavior During Therapy: WFL for tasks assessed/performed Overall Cognitive Status: Within Functional Limits for tasks assessed                                          Exercises Exercises: General Lower Extremity General Exercises - Lower Extremity Quad Sets: AROM, Strengthening, Both, 10 reps, Supine Long Arc Quad: AROM, Strengthening, Both, Seated, 10 reps Heel Slides: AROM, Strengthening, Both, Seated, 10 reps            Pertinent Vitals/ Pain  Pain Assessment Pain Assessment: 0-10 Pain Score: 6  Pain Location: abdomen with activity Pain Descriptors / Indicators: Discomfort, Operative site guarding Pain Intervention(s): Limited activity within patient's tolerance, Repositioned   Frequency  Min 3X/week        Progress Toward Goals  OT Goals(current goals can now be found in the care plan section)  Progress towards OT goals: Progressing toward goals  Acute Rehab OT Goals Patient Stated Goal: to return to PLOF OT Goal Formulation: With patient/family Time For Goal Achievement: 10/14/22 Potential to Achieve Goals: Good ADL Goals Pt Will Perform Grooming: with min assist;standing Pt Will Perform Lower Body Dressing: with min  assist;sit to/from stand;with caregiver independent in assisting Pt Will Transfer to Toilet: with min assist;ambulating;bedside commode  Plan Discharge plan remains appropriate;Frequency remains appropriate    Co-evaluation                 AM-PAC OT "6 Clicks" Daily Activity     Outcome Measure   Help from another person eating meals?: Total (NPO) Help from another person taking care of personal grooming?: A Little Help from another person toileting, which includes using toliet, bedpan, or urinal?: A Lot Help from another person bathing (including washing, rinsing, drying)?: A Lot Help from another person to put on and taking off regular upper body clothing?: A Lot Help from another person to put on and taking off regular lower body clothing?: A Lot 6 Click Score: 12    End of Session    OT Visit Diagnosis: Other abnormalities of gait and mobility (R26.89);Muscle weakness (generalized) (M62.81)   Activity Tolerance Patient limited by fatigue;Patient limited by pain   Patient Left in bed;with call bell/phone within reach;with family/visitor present   Nurse Communication Mobility status        Time: 1007-1219 OT Time Calculation (min): 34 min  Charges: OT General Charges $OT Visit: 1 Visit OT Treatments $Self Care/Home Management : 8-22 mins $Therapeutic Exercise: 8-22 mins  Dessie Coma, M.S. OTR/L  10/05/22, 1:18 PM  ascom [phone]

## 2022-10-05 NOTE — Procedures (Signed)
Intubation Procedure Note  JAVONNI MACKE  [id number]  Mar 25, 1956  Date:10/05/22  Time:6:30 PM   Provider Performing:Decarlo Rivet D Dewaine Conger    Procedure: Intubation (16384)  Indication(s) Respiratory Failure  Consent Unable to obtain consent due to emergent nature of procedure.   Anesthesia Etomidate, Fentanyl, and Rocuronium   Time Out Verified patient identification, verified procedure, site/side was marked, verified correct patient position, special equipment/implants available, medications/allergies/relevant history reviewed, required imaging and test results available.   Sterile Technique Usual hand hygeine, masks, and gloves were used   Procedure Description Patient positioned in bed supine.  Sedation given as noted above.  Patient was intubated with endotracheal tube using Glidescope.  View was Grade 1 full glottis .  Number of attempts was 1.  Colorimetric CO2 detector was consistent with tracheal placement.   Complications/Tolerance None; patient tolerated the procedure well. Chest X-ray is ordered to verify placement.   EBL Minimal   Specimen(s) None   Size 8.0 ETT Tube secured at 24 cm at lip.   Darel Hong, AGACNP-BC [facility] Prefer epic messenger for cross cover needs If after hours, please call E-link

## 2022-10-05 NOTE — Consult Note (Signed)
NAME:  Nicolas Zuniga, MRN:  [id number], DOB:  Sep 19, 1956, LOS: 68 ADMISSION DATE:  09/24/2022, CONSULTATION DATE: 09/28/22 REFERRING MD: Dr. Dahlia Byes   History of Present Illness:  This is a 66 yo male with right colon cancer and cholelithiasis who presented to [facility] on 10/19 for laparoscopic right colectomy and cholecystectomy.  Surgical findings were right colon mass with evidence of bulky mesenteric nodal involvement; large right colon mass adhered to the retroperitoneum; mesenteric nodes within terminal ileum; tension free anastomosis, no evidence of intraop leak and good perfusion; and chronic calculus cholecystitis.  Postop pt admitted to the medsurg unit per general surgery for additional workup and treatment.  Prolonged hospital course, please see detailed hospital course under significant events. Cardiology, PCCM, General Surgery, TRH, Infectious Disease, Psychiatry & Nephrology all involved in care. 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  Hospitalist team consulted to assist with management all beta blockers and flecainide discontinued  10/22: Pt developed atrial fibrillation with rvr, acute respiratory failure, and hypotension with questionable episode of Vtach, rapid response initiated.  Pt required amiodarone bolus followed by drip and transfer to the stepdown unit.  Rate  remained uncontrolled and cardizem gtt added _0  mg/hr in addition to amiodarone gtt.  Cardiology consulted   10/22: CT Abd Pelvis concerning for possible small leak but no contrast extravasation.  General Surgery recommended- antibiotic therapy; repeat scan in 24-48hrs; and keep NPO except meds  10/23: revealed no evidence of gross enteric contrast extravasation but increased fluid collection adjacent to the anastomosis and slight increase in fluid volume in the right paracolic gutter.  Pt taken back to the OR for exploratory laparotomy with findings of no obvious anastomotic leak or evidence of large or small bowel injuries but intra-abdominal purulent fluid with significant inflammatory response present. Abdominal washout with placement of JP drain and diverting loop colostomy. Pt remained mechanically intubated.  PCCM        team consulted plans for SBT and possible extubation  10/24: Pt with worsening acute renal failure Nephrology consulted.  Pt with bradycardia overnight cardizem and precedex gtts discontinued.  Pt remains on amiodarone gtt at 30 mg/hr  Pt alert and following commands plans for possible SBT today ~ EXTUBATED 10/25: Tolerating extubation from respiratory standpoint. Off Vasopressors. Slight worsening of AKI, starting to make urine.  DKA resolved, weaning off insulin gtt to SQ insulin.  Atrial fibrillation rate controlled on Amiodarone.  10/26: transfer to [facility] service, NG tube was clamped and output is very minimal, general surgery recommended to start clear liquid diet, NGT removed. No significant overnight events  10/27: Clinically continues to improve, creat trending down, better HD. WBC up bu no fevers and drain is serous and midline wound is healing well. Probably time for diuresis. If wbc continue to be up or if he spikes a fever or has any set back will need repeat CT A/P w po contrast only. 10/28:  No significant events overnight.  In the morning time patient  was complaining of  some abdominal discomfort, feeling better now.  WBC count 35.5 significant elevated, discussed with general surgery, CT a/p shows free gas, multiple fluid collections without definitive abscess, ESR 89, Pro-Cal 0.88, CRP 23.3. Tolerating diet well for now.  Denied any chest pain or palpitations.  10/29: Psych consulted for anxiety. Fluid culture from JP drain and meropenem started due to persistent leukocytosis. Sputum culture- serratia marcescens- resistant to cefazolin only s/p zosyn, on meropenem. 10/30: Overnight bright red blood reported per ileostomy, heparin held. Hgb stable. IR consulted for persistent leukocytosis and management of intra-abdominal fluid collections with plan for aspiration and possible drain placement. 2 fluid collections aspirated and 3rd fluid collection underwent drain placement. Post procedure patient transferred to ICU urgently due to worsening mental status, hypercapnia and increased WOB. Patient required emergent intubation and mechanical ventilatory support upon arrival.  Significant Test:  10/22: CT Abd Pelvis Ill-defined slightly high density fluid along the ileocolic suture line, which may represent a small amount of extravasated contrast/leak. Small to moderate amount of pneumoperitoneum and small amount of ascites within the abdomen and pelvis, not unexpected given surgery but  nonspecific. No evidence of focal collection/defined abscess. Trace bilateral pleural effusions and bibasilar atelectasis. 2 low-density areas within the prostate gland, nonspecific but correlate with PSA. Aortic Atherosclerosis (ICD10-I70.0). 10/22: CTA Chest no definite evidence of pulmonary embolus. Minimal bilateral pleural effusions are noted with adjacent subsegmental atelectasis. Pneumoperitoneum is noted in visualized portion of upper abdomen consistent with reported history of recent laparoscopic right colectomy and cholecystectomy. 10/23: CT Abd Pelvis while there is no evidence for  gross enteric contrast extravasation at the level of the ileocolic anastomosis, the small fluid collection adjacent to the anastomosis identified on yesterday's study has increased in attenuation in the interval. This increase in attenuation is concerning for some trace spill of contrast from the anastomosis into the collection. Slight increase in fluid volume in the right paracolic gutter with similar fluid volume around the liver and in the left paracolic gutter. Collections of interloop mesenteric fluid are not substantially changed. Free gas in the peritoneal cavity is not unexpected 4 days out from surgery. Gas in the midline subcutaneous tissues deep to the staple line has increased in the interval. Contrast in the kidneys compatible with residual from yesterday's infused scan. Bibasilar atelectasis with tiny bilateral pleural effusions. Aortic Atherosclerosis (ICD10-I70.0).  10/28 CT A/P: Decreased free gas, multiple fluid collections no definitive abscess due to lack of IV contrast.  Small bilateral pleural effusions.  10/30 IR CT aspiration of left anterior abdominal fluid collection, aspiration of left posterolateral abdominal fluid collection and catheter drainage of perihepatic fluid collection: Left anterior fluid collection: clear, yellow fluid. Sample sent for culture. 90 mL aspirated via 18 G trocar needle resulting in complete decompression. Left posterolateral fluid collection: slightly turbid, darker yellow fluid. Sample sent for culture. 50 mL aspirated resulting in near-complete decompression. Perihepatic fluid collection: clear, yellow fluid. 10 Fr drain placed in perihepatic fluid collection given its larger size. Drain attached to suction bulb drainage.   Interim History / Subjective:  Patient arrived in the ICU after urgent transfer due to worsening mental status and increased WOB as a HIGH RISK for INTUBATION. Upon arrival patient required emergent intubation and mechanical  ventilatory support.  Objective   Blood pressure (!) 83/56, pulse 80, temperature (!) 97.5 F (36.4 C), resp. rate 20, height _0  (1.88 m), weight 67.1 kg, SpO2 99 %.    Vent Mode: PRVC FiO2 (%):  [  60 %] 60 % Set Rate:  [15 bmp] 15 bmp Vt Set:  [500 mL-550 mL] 550 mL PEEP:  [5 cmH20] 5 cmH20   Intake/Output Summary (Last 24 hours) at 10/05/2022 2021 Last data filed at 10/05/2022 2000 Gross per 24 hour  Intake 2264.12 ml  Output 4500 ml  Net -2235.88 ml   Filed Weights   09/30/22 0500 10/01/22 0427 10/05/22 0434  Weight: (!) 139.3 kg (!) 138.9 kg 67.1 kg   Examination: General: Adult male, critically ill, lying in bed intubated & sedated requiring mechanical ventilation, NAD HEENT: MM pink/moist, anicteric, atraumatic, neck supple Neuro: Sedated, unable to follow commands, PERRL +3 CV: s1s2 irregular, controlled A-fib on monitor, no r/m/g Pulm: Regular, non labored on PRVC: 60%/5/15/550, breath sounds clear-BUL & diminished-BLL GI: soft, rounded, bs x 4 GU: foley in place with clear yellow urine Skin: abdominal IR sites and midline incision clean, dry, intact- no rashes/lesions noted Pressure Injury 09/30/22 Nare Left Deep Tissue Pressure Injury - Purple or maroon localized area of discolored intact skin or blood-filled blister due to damage of underlying soft tissue from pressure and/or shear. nonblanchable, red/purple bruise at lef (Active)  09/30/22 1100  Location: Nare  Location Orientation: Left  Staging: Deep Tissue Pressure Injury - Purple or maroon localized area of discolored intact skin or blood-filled blister due to damage of underlying soft tissue from pressure and/or shear.  Wound Description (Comments): nonblanchable, red/purple bruise at left nare  Present on Admission:   Extremities: warm/dry, pulses + 2 R/P, +2 edema noted BLE  Resolved Hospital Problem list   Bradycardia  Hypotension s/t hypovolemia & septic shock Mechanical intubation intraoperatively  for airway protection ~ EXTUBATED 10/24 Anion gap metabolic acidosis ~ RESOLVED DKA Atrial Fibrillation with RVR Hyponatremia   Assessment & Plan:  Acute Hypoxic/Hypercapnic Respiratory Failure secondary to suspected aspiration post-operatively requiring emergent intubation and mechanical ventilatory support - Ventilator settings: PRVC  500 TV, 60% FiO2, 5 PEEP, continue ventilator support & lung protective strategies - Wean PEEP and FiO2 as able to maintain O2 sats >90% - Goal plateau pressure less than 30, driving pressure less than 15 - Intermittent ABG and CXR  - Head of bed elevated 30 degrees, VAP protocol in place - Daily WUA with SBT as tolerated  - Ensure adequate pulmonary hygiene  - F/u cultures, antibiotics per ID recommendations - Bronchodilators PRN - PAD protocol in place: continue Fentanyl drip & Propofol drip  Paroxsymal Atrial fibrillation  Hypotension multifocal in the setting of acute blood loss anemia & sedation in the setting of mechanical ventilation HFpEF Hx: HTN and Chronic diastolic dysfunction  ECHOCARDIOGRAM 09/29/22: LVEF 01-74%, normal diastolic function, RV function normal S/p amiodarone infusion - STAT troponin, trend to peak - heparin drip on hold for IR procedure earlier, and bright red blood per ileostomy > upon bedside assessment ileostomy has approx. 30 mL of maroon colored liquid stool >> continue to hold heparin overnight - Continue amiodarone 200 mg daily, hold lopressor & lasix while the patient requires vasopressor support - transition levophed to phenylephrine drip, wean as tolerated to maintain MAP > 65, consider vasopressin PRN - continuous cardiac monitoring - daily weights - cardiology following, appreciate input  Anemia secondary to Acute Blood Loss Bright red blood per ileostomy, heparin drip stopped > maroon colored stool in ileostomy currently. Patient received 1 unit pRBC's 10/30 - Monitor for s/s of bleeding - heparin will  remain stopped overnight - STAT CBC & lactic - Transfuse for Hgb <7, serial  H&H  Acute kidney injury secondary ATN ~ improving Baseline Cr: 1.06, Cr on admission: 2.2, as high as 4.61 >> now trending down, most recent: 1.32 (10/30) - STAT CMP, Mg, Phos - Strict I/O's: alert provider if UOP < 0.5 mL/kg/hr - gentle IVF hydration  - Daily BMP, replace electrolytes PRN - Avoid nephrotoxic agents as able, ensure adequate renal perfusion - Re-Consult nephrology if indicated    Right colon cancer and cholelithiasis s/p laparoscopic right colectomy and cholecystectomy: 09/24/22 Intra-abdominal infection s/p exploratory laparotomy with abdominal washout and diverting loop ileostomy 10/23 S/p CT aspiration of left anterior abdominal fluid collection, aspiration of left posterolateral abdominal fluid collection and catheter drainage of perihepatic fluid collection 10/30 - f/u culture, daily CBC, monitor WBC/fever curve - continue meropenem >> ID following, appreciate input - General Surgery & IR following appreciate input - NGT to LIS - continue TPN  Type 2 Diabetes Mellitus Hemoglobin A1C: 9.8 (10/19) - Monitor CBG Q 4 hours - continue SSI resistant dosing, standing 5 units Q 4 & levemir 15 units BID - f/u beta-hydroxy level - target range while in ICU: 140-180 - follow ICU hyper/hypo-glycemia protocol - Diabetes Coordinator following, appreciate input  Left Nare Deep Tissue Pressure Injury - 10/25 - nutrition promotion with TPN - off-load area, currently clean dry and intact  Best Practice (right click and "Reselect all SmartList Selections" daily)  Diet/type: NPO DVT prophylaxis: SCD GI prophylaxis: PPI Lines: Right upper extremity PICC and still needed  Foley:  Yes, and it is still needed Code Status:  full code Last date of multidisciplinary goals of care discussion [10/05/22]  Labs   CBC: Recent Labs  Lab 10/01/22 0136 10/02/22 0524 10/03/22 0426 10/04/22 0538  10/04/22 2200 10/05/22 0440  WBC 17.7* 29.5* 35.5* 36.9* 34.7* 31.9*  NEUTROABS 13.2* 22.8* 29.8* 27.2*  --  24.6*  HGB 8.1* 7.7* 8.8* 7.8* 8.0* 7.2*  HCT 25.5* 24.4* 27.8* 24.1* 26.0* 23.3*  MCV 74.6* 73.9* 73.5* 73.9* 76.0* 77.9*  PLT 211 261 324 367 388 405*    Basic Metabolic Panel: Recent Labs  Lab 10/01/22 0136 10/02/22 0524 10/03/22 0426 10/04/22 0538 10/05/22 0440  NA 133* 133* 136 134* 136  K 3.9 3.8 4.2 4.1 4.4  CL 101 102 105 102 106  CO2 22 20* _0 GLUCOSE 165* 284* 283* 313* 248*  BUN 52* 48* 38* 33* 31*  CREATININE 4.39* 3.07* 2.23* 1.67* 1.32*  CALCIUM 8.4* 8.3* 8.4* 8.4* 8.4*  MG 2.6* 2.4 1.8 1.7 1.8  PHOS 2.1* 2.4* 2.6 3.1 3.1   GFR: Estimated Creatinine Clearance: 52.2 mL/min (A) (by C-G formula based on SCr of 1.32 mg/dL (H)). Recent Labs  Lab 09/29/22 0106 09/30/22 0413 10/01/22 0136 10/03/22 0422 10/03/22 0426 10/04/22 0538 10/04/22 2200 10/05/22 0440  PROCALCITON 18.25 20.55  --  0.88  --   --   --   --   WBC 13.4* 14.7*   < >  --  35.5* 36.9* 34.7* 31.9*  LATICACIDVEN 2.7*  --   --   --   --   --   --   --    < > = values in this interval not displayed.    Liver Function Tests: No results for input(s): "AST", "ALT", "ALKPHOS", "BILITOT", "PROT", "ALBUMIN" in the last 168 hours.  No results for input(s): "LIPASE", "AMYLASE" in the last 168 hours. No results for input(s): "AMMONIA" in the last 168 hours.  ABG    Component Value Date/Time  PHART 7.29 (L) 10/05/2022 1920   PCO2ART 57 (H) 10/05/2022 1920   PO2ART 90 10/05/2022 1920   HCO3 27.4 10/05/2022 1920   ACIDBASEDEF 0.3 10/05/2022 1920   O2SAT 98 10/05/2022 1920     Coagulation Profile: Recent Labs  Lab 10/05/22 0440  INR 1.3*    Cardiac Enzymes: No results for input(s): "CKTOTAL", "CKMB", "CKMBINDEX", "TROPONINI" in the last 168 hours.  HbA1C: Hemoglobin A1C  Date/Time Value Ref Range Status  06/16/2012 10:17 AM 7.2 (H) 4.2 - 6.3 % Final    Comment:     The American Diabetes Association recommends that a primary goal of therapy should be <7% and that physicians should reevaluate the treatment regimen in patients with HbA1c values consistently >8%.    Hgb A1c MFr Bld  Date/Time Value Ref Range Status  09/24/2022 05:44 PM 9.8 (H) 4.8 - 5.6 % Final    Comment:    (NOTE) Pre diabetes:          5.7%-6.4%  Diabetes:              >6.4%  Glycemic control for   <7.0% adults with diabetes   08/08/2021 01:43 PM 7.4 (H) 4.8 - 5.6 % Final    Comment:    (NOTE) Pre diabetes:          5.7%-6.4%  Diabetes:              >6.4%  Glycemic control for   <7.0% adults with diabetes     CBG: Recent Labs  Lab 10/05/22 0808 10/05/22 1203 10/05/22 1245 10/05/22 1535 10/05/22 1928  GLUCAP 203* 262* 256* 243* 275*    Review of Systems:   UTA- patient intubated and sedated- upon arrival to the unit somnolent with increased WOB requiring emergent intubation and mechanical ventilatory support  Past Medical History:  He,  has a past medical history of Adenocarcinoma of colon ([place]) (09/08/2022), Cholelithiasis, Chronic gastritis, Colon polyps, Coronary artery calcification seen on CT scan, Diastolic dysfunction, Diverticulosis, Hypertension, Long term current use of anticoagulant, Nephrolithiasis, Paroxysmal A-fib ([place]), and T2DM (type 2 diabetes mellitus) ([place]).   Surgical History:   Past Surgical History:  Procedure Laterality Date   CARDIOVERSION N/A 12/22/2018   Procedure: CARDIOVERSION (CATH LAB);  Surgeon: Corey Skains, MD;  Location: [facility] ORS;  Service: Cardiovascular;  Laterality: N/A;   CARDIOVERSION N/A 08/18/2021   Procedure: CARDIOVERSION;  Surgeon: Corey Skains, MD;  Location: [facility] ORS;  Service: Cardiovascular;  Laterality: N/A;   CARDIOVERSION N/A 03/11/2022   Procedure: CARDIOVERSION;  Surgeon: Corey Skains, MD;  Location: [facility] ORS;  Service: Cardiovascular;  Laterality: N/A;   CARDIOVERSION N/A 03/31/2022    Procedure: CARDIOVERSION;  Surgeon: Corey Skains, MD;  Location: [facility] ORS;  Service: Cardiovascular;  Laterality: N/A;   CHOLECYSTECTOMY N/A 09/24/2022   Procedure: LAPAROSCOPIC CHOLECYSTECTOMY;  Surgeon: Jules Husbands, MD;  Location: [facility] ORS;  Service: General;  Laterality: N/A;   COLONOSCOPY WITH PROPOFOL N/A 09/08/2022   Procedure: COLONOSCOPY WITH PROPOFOL;  Surgeon: Lucilla Lame, MD;  Location: [facility] ENDOSCOPY;  Service: Endoscopy;  Laterality: N/A;   ELECTROPHYSIOLOGIC STUDY N/A 07/09/2016   Procedure: CARDIOVERSION;  Surgeon: Dionisio David, MD;  Location: [facility] ORS;  Service: Cardiovascular;  Laterality: N/A;   LAPAROSCOPIC RIGHT COLECTOMY N/A 09/24/2022   Procedure: LAPAROSCOPIC RIGHT COLECTOMY, RNFA to assist;  Surgeon: Jules Husbands, MD;  Location: [facility] ORS;  Service: General;  Laterality: N/A;   LAPAROTOMY N/A 09/28/2022   Procedure: EXPLORATORY LAPAROTOMY WITH  OSTOMY CREATION;  Surgeon: Jules Husbands, MD;  Location: [facility] ORS;  Service: General;  Laterality: N/A;   TEE WITHOUT CARDIOVERSION N/A 07/09/2016   Procedure: TRANSESOPHAGEAL ECHOCARDIOGRAM (TEE);  Surgeon: Dionisio David, MD;  Location: [facility] ORS;  Service: Cardiovascular;  Laterality: N/A;   web fingers repaired     as a child     Social History:   reports that he has never smoked. He has never been exposed to tobacco smoke. He has never used smokeless tobacco. He reports that he does not drink alcohol and does not use drugs.   Family History:  His family history includes COPD in his father; Diabetes in his mother; Heart disease in his mother; Varicose Veins in his mother.   Allergies No Known Allergies   Home Medications  Prior to Admission medications   Medication Sig Start Date End Date Taking? Authorizing Provider  bisacodyl (DULCOLAX) 5 MG EC tablet Take all 4 tablets at 8 am the morning prior to your surgery. 09/14/22  Yes Pabon, Diego F, MD  Cholecalciferol (VITAMIN D) 50 MCG (2000 UT) CAPS Take 2,000  Units by mouth daily.   Yes [provider]  flecainide (TAMBOCOR) 100 MG tablet Take 100 mg by mouth 2 (two) times daily. 03/17/22  Yes [provider]  glipiZIDE (GLUCOTROL XL) 10 MG 24 hr tablet Take 10 mg by mouth 2 (two) times daily.    Yes [provider]  metFORMIN (GLUCOPHAGE-XR) 500 MG 24 hr tablet Take 500 mg by mouth daily with breakfast. 02/10/21  Yes [provider]  metoprolol (LOPRESSOR) 100 MG tablet Take 100 mg by mouth 2 (two) times daily.   Yes [provider]  metroNIDAZOLE (FLAGYL) 500 MG tablet Take 2 tablets at 8AM, take 2 tablets at [facility], and take 2 tablets at 8PM the day prior to your surgery 09/14/22  Yes Pabon, Diego F, MD  Multiple Vitamins-Minerals (MULTIVITAMIN WITH MINERALS) tablet Take 1 tablet by mouth daily with breakfast.    Yes [provider]  neomycin (MYCIFRADIN) 500 MG tablet Take 2 tablet at 8am, take 2 tablets at 2pm, and take 2 tablets at 8pm the day prior to your surgery 09/14/22  Yes Pabon, Diego F, MD  Omega-3 Fatty Acids (FISH OIL) 1000 MG CAPS Take 1,000 mg by mouth daily.   Yes [provider]  OZEMPIC, 1 MG/DOSE, 4 MG/3ML SOPN Inject 1 mg into the skin every Wednesday. Patient stopped taking these medications 08/02/21  Yes [provider]  polyethylene glycol powder (MIRALAX) 17 GM/SCOOP powder Mix full container in 64 ounces of Gatorade or other clear liquid. NO Red 09/14/22  Yes Pabon, [place], MD  Turmeric 400 MG CAPS Take 400 mg by mouth daily.   Yes [provider]  vitamin B-12 (CYANOCOBALAMIN) 100 MCG tablet Take 500 mcg by mouth daily.   Yes [provider]  Vitamin E 400 units TABS Take 400 Units by mouth daily.   Yes [provider]  apixaban (ELIQUIS) 5 MG TABS tablet Take 5 mg by mouth 2 (two) times daily.    [provider]  Coenzyme Q10 (COQ10) 100 MG CAPS Take 100 mg by mouth daily.    [provider]     Critical care time: 65  minutes     Venetia Night, AGACNP-BC Acute Care Nurse Practitioner [facility]   [phone] / [phone] Please see Amion for pager details.

## 2022-10-05 NOTE — Plan of Care (Signed)
  Problem: Education: Goal: Ability to describe self-care measures that may prevent or decrease complications (Diabetes Survival Skills Education) will improve Outcome: Progressing Goal: Individualized Educational Video(s) Outcome: Progressing   Problem: Coping: Goal: Ability to adjust to condition or change in health will improve Outcome: Progressing   Problem: Fluid Volume: Goal: Ability to maintain a balanced intake and output will improve Outcome: Progressing   Problem: Health Behavior/Discharge Planning: Goal: Ability to identify and utilize available resources and services will improve Outcome: Progressing Goal: Ability to manage health-related needs will improve Outcome: Progressing   Problem: Metabolic: Goal: Ability to maintain appropriate glucose levels will improve Outcome: Progressing   Problem: Nutritional: Goal: Maintenance of adequate nutrition will improve Outcome: Progressing Goal: Progress toward achieving an optimal weight will improve Outcome: Progressing   Problem: Skin Integrity: Goal: Risk for impaired skin integrity will decrease Outcome: Progressing   Problem: Tissue Perfusion: Goal: Adequacy of tissue perfusion will improve Outcome: Progressing   Problem: Education: Goal: Knowledge of General Education information will improve Description: Including pain rating scale, medication(s)/side effects and non-pharmacologic comfort measures Outcome: Progressing   Problem: Health Behavior/Discharge Planning: Goal: Ability to manage health-related needs will improve Outcome: Progressing   Problem: Clinical Measurements: Goal: Ability to maintain clinical measurements within normal limits will improve Outcome: Progressing Goal: Will remain free from infection Outcome: Progressing Goal: Diagnostic test results will improve Outcome: Progressing Goal: Respiratory complications will improve Outcome: Progressing Goal: Cardiovascular complication will  be avoided Outcome: Progressing   Problem: Activity: Goal: Risk for activity intolerance will decrease Outcome: Progressing   Problem: Nutrition: Goal: Adequate nutrition will be maintained Outcome: Progressing   Problem: Skin Integrity: Goal: Risk for impaired skin integrity will decrease Outcome: Progressing   Problem: Safety: Goal: Ability to remain free from injury will improve Outcome: Progressing   Problem: Pain Managment: Goal: General experience of comfort will improve Outcome: Progressing

## 2022-10-05 NOTE — Progress Notes (Signed)
Transfer patient to ICU.  Patient with shallow rapid breathing, worsening mental status.  Needs close monitoring in the ICU.  ABG ordered.  High risk for intubation.  Case discussed with critical care team.

## 2022-10-05 NOTE — Inpatient Diabetes Management (Signed)
Inpatient Diabetes Program Recommendations  AACE/ADA: New Consensus Statement on Inpatient Glycemic Control (2015)  Target Ranges:  Prepandial:   less than 140 mg/dL      Peak postprandial:   less than 180 mg/dL (1-2 hours)      Critically ill patients:  140 - 180 mg/dL   Lab Results  Component Value Date   GLUCAP 203 (H) 10/05/2022   HGBA1C 9.8 (H) 09/24/2022    Review of Glycemic Control  Latest Reference Range & Units 10/04/22 11:54 10/04/22 15:54 10/04/22 20:32 10/04/22 23:51 10/05/22 03:46 10/05/22 08:08  Glucose-Capillary 70 - 99 mg/dL 282 (H) Novolog 16 units 267 (H) Novolog 16 units 267 (H) Novolog 16 units   239 (H) Novolog 12 units 253 (H) Novolog 16 units  203 (H) Novolog 12 units  (H): Data is abnormally high  Diabetes history: DM2 Outpatient Diabetes medications: Glipizide XL 10 mg BID, Metformin 500 mg QAM, Ozempic 1 mg Qweek Current orders for Inpatient glycemic control: Levemir 15 units BID, Novolog 0-20 units Q4H;  Novolog 5 units Q4Hm, TPN with 25 units regular insulin @ 90 ml/hr  Inpatient Diabetes Program Recommendations:    Patient has received a total of 56 units of correction and TPN coverage since 8pm last evening.    Please consider:  Increase insulin in TPN to 50 units regular insulin this evening.   Decrease TPN coverage to 3 units Q4H this evening.  Will continue to follow while inpatient.  Thank you, Reche Dixon, MSN, [facility] Coordinator Inpatient Diabetes Program [phone] (team pager from 8a-5p)

## 2022-10-05 NOTE — Progress Notes (Signed)
Received patient from 2A this evening. When patient arrived he was tachypneic with very shallow breathing. He was obtunded and not responding to painful stimuli.   RSI completed and successful with no issues. OG and esophageal temperature probe placed. Foley placed prior to arrival. TPN d/c'd from PICC line for levophed administration due to hypotension following RSI medications.   OG placed to low intermittent suction. Penial drainage swabbed and sent for culture. Sacral foam placed for protection. 2 skin tears noted on right distal forearm-foam placed.   Fentanyl, Propofol, Normal saline, and Levophed infusions running. Sedation titrated for comfort and intubation tolerance.   Wife, Collie Siad, was updated and visited patient at the bedside for a short time before going home to get rest. She was notified we would call with any issues and she will be back tomorrow.

## 2022-10-05 NOTE — Progress Notes (Addendum)
[facility] NOTE       Patient ID: Nicolas Zuniga MRN: [id number] DOB/AGE: Jul 04, 1956 66 y.o.  Admit date: 09/24/2022 Referring Physician Sharion Settler, NP  Primary Physician Dr. Hortencia Pilar ([facility])  Primary Cardiologist Nehemiah Massed Reason for Consultation Af RVR  HPI: Nicolas Zuniga is a 47SJG with a PMH of paroxysmal atrial fibrillation (on flecainide, metoprolol, and Eliquis), HFpEF (EF >55, G1 DD 04/2022), type II diabetes, hypertension, recently diagnosed adenocarcinoma of the colon who presented to [facility] 09/24/2022 for and elective laparoscopic right colectomy ultimately converted to open right colectomy and cholecystectomy.  Postoperative course was complicated by hypotension and bradycardia the evening of postop day 1, concerning for beta-blocker toxicity for which he was started on glucagon infusion.  Rapid response was called overnight on 10/21 for atrial fibrillation with RVR and questionable VT, was started on amiodarone and diltiazem infusions with some heart rate response.  On 10/23 he underwent exploratory laparotomy, abdominal washout, and diverting loop ileostomy for intra-abdominal purulent fluid seen on CT abdomen pelvis.  Postoperatively, he remained intubated and requiring vasopressor support.  He converted to atrial flutter with variable conduction the evening of 10/23.  Interval history: -appears very fatigued, feels "rough." Still has abdominal pain -no chest pain, shortness of breath, palpitaitons -heparin paused in anticipation of further IR procedures this afternoon  -remain in AF, rate acceptable in the 90s-low 100s   Past Medical History:  Diagnosis Date   Adenocarcinoma of colon ([place]) 09/08/2022   Cholelithiasis    Chronic gastritis    Colon polyps    Coronary artery calcification seen on CT scan    Diastolic dysfunction    a.) TTE 04/09/2022: EF >55%, triv TR, G1DD   Diverticulosis    Hypertension    Long term current use  of anticoagulant    a.) apixaban   Nephrolithiasis    Paroxysmal A-fib ([place])    a.) CHA2DS2VASc = 3 (age, HTN, T2DM);  b.) s/p DCCV 07/09/2016 (100 J x1), 12/22/2018 (120 J x1), 08/18/2021 (120 J x1), 03/11/2022 (120 J x1), 03/31/2022 (120 J x1); c.) rate/rhythm maintained on oral flecanide + metoprolol succinate; chronically anticoagulated with apixaban   T2DM (type 2 diabetes mellitus) ([place])     Past Surgical History:  Procedure Laterality Date   CARDIOVERSION N/A 12/22/2018   Procedure: CARDIOVERSION (CATH LAB);  Surgeon: Corey Skains, MD;  Location: [facility] ORS;  Service: Cardiovascular;  Laterality: N/A;   CARDIOVERSION N/A 08/18/2021   Procedure: CARDIOVERSION;  Surgeon: Corey Skains, MD;  Location: [facility] ORS;  Service: Cardiovascular;  Laterality: N/A;   CARDIOVERSION N/A 03/11/2022   Procedure: CARDIOVERSION;  Surgeon: Corey Skains, MD;  Location: [facility] ORS;  Service: Cardiovascular;  Laterality: N/A;   CARDIOVERSION N/A 03/31/2022   Procedure: CARDIOVERSION;  Surgeon: Corey Skains, MD;  Location: [facility] ORS;  Service: Cardiovascular;  Laterality: N/A;   CHOLECYSTECTOMY N/A 09/24/2022   Procedure: LAPAROSCOPIC CHOLECYSTECTOMY;  Surgeon: Jules Husbands, MD;  Location: [facility] ORS;  Service: General;  Laterality: N/A;   COLONOSCOPY WITH PROPOFOL N/A 09/08/2022   Procedure: COLONOSCOPY WITH PROPOFOL;  Surgeon: Lucilla Lame, MD;  Location: [facility] ENDOSCOPY;  Service: Endoscopy;  Laterality: N/A;   ELECTROPHYSIOLOGIC STUDY N/A 07/09/2016   Procedure: CARDIOVERSION;  Surgeon: Dionisio David, MD;  Location: [facility] ORS;  Service: Cardiovascular;  Laterality: N/A;   LAPAROSCOPIC RIGHT COLECTOMY N/A 09/24/2022   Procedure: LAPAROSCOPIC RIGHT COLECTOMY, RNFA to assist;  Surgeon: Jules Husbands, MD;  Location: [facility]  ORS;  Service: General;  Laterality: N/A;   LAPAROTOMY N/A 09/28/2022   Procedure: EXPLORATORY LAPAROTOMY WITH OSTOMY CREATION;  Surgeon: Jules Husbands, MD;  Location: [facility]  ORS;  Service: General;  Laterality: N/A;   TEE WITHOUT CARDIOVERSION N/A 07/09/2016   Procedure: TRANSESOPHAGEAL ECHOCARDIOGRAM (TEE);  Surgeon: Dionisio David, MD;  Location: [facility] ORS;  Service: Cardiovascular;  Laterality: N/A;   web fingers repaired     as a child    Medications Prior to Admission  Medication Sig Dispense Refill Last Dose   bisacodyl (DULCOLAX) 5 MG EC tablet Take all 4 tablets at 8 am the morning prior to your surgery. 4 tablet 0 09/23/2022   Cholecalciferol (VITAMIN D) 50 MCG (2000 UT) CAPS Take 2,000 Units by mouth daily.   Past Week   flecainide (TAMBOCOR) 100 MG tablet Take 100 mg by mouth 2 (two) times daily.   09/24/2022   glipiZIDE (GLUCOTROL XL) 10 MG 24 hr tablet Take 10 mg by mouth 2 (two) times daily.    09/24/2022   metFORMIN (GLUCOPHAGE-XR) 500 MG 24 hr tablet Take 500 mg by mouth daily with breakfast.   Past Month   metoprolol (LOPRESSOR) 100 MG tablet Take 100 mg by mouth 2 (two) times daily.   09/24/2022   metroNIDAZOLE (FLAGYL) 500 MG tablet Take 2 tablets at 8AM, take 2 tablets at [place], and take 2 tablets at 8PM the day prior to your surgery 6 tablet 0 09/23/2022   Multiple Vitamins-Minerals (MULTIVITAMIN WITH MINERALS) tablet Take 1 tablet by mouth daily with breakfast.    Past Week   neomycin (MYCIFRADIN) 500 MG tablet Take 2 tablet at 8am, take 2 tablets at 2pm, and take 2 tablets at 8pm the day prior to your surgery 6 tablet 0 09/23/2022   Omega-3 Fatty Acids (FISH OIL) 1000 MG CAPS Take 1,000 mg by mouth daily.   Past Week   OZEMPIC, 1 MG/DOSE, 4 MG/3ML SOPN Inject 1 mg into the skin every Wednesday. Patient stopped taking these medications   Past Month   polyethylene glycol powder (MIRALAX) 17 GM/SCOOP powder Mix full container in 64 ounces of Gatorade or other clear liquid. NO Red 238 g 0 09/23/2022   Turmeric 400 MG CAPS Take 400 mg by mouth daily.   Past Week   vitamin B-12 (CYANOCOBALAMIN) 100 MCG tablet Take 500 mcg by mouth daily.   Past Week    Vitamin E 400 units TABS Take 400 Units by mouth daily.   Past Week   apixaban (ELIQUIS) 5 MG TABS tablet Take 5 mg by mouth 2 (two) times daily.   09/21/2022   Coenzyme Q10 (COQ10) 100 MG CAPS Take 100 mg by mouth daily.   09/21/2022   Social History   Socioeconomic History   Marital status: Married    Spouse name: Not on file   Number of children: Not on file   Years of education: Not on file   Highest education level: Not on file  Occupational History   Not on file  Tobacco Use   Smoking status: Never    Passive exposure: Never   Smokeless tobacco: Never  Vaping Use   Vaping Use: Never used  Substance and Sexual Activity   Alcohol use: No   Drug use: No   Sexual activity: Not on file  Other Topics Concern   Not on file  Social History Narrative   Lives with wife, [name], with on pet, cat.   Social Determinants of Health  Financial Resource Strain: Not on file  Food Insecurity: No Food Insecurity (09/24/2022)   Hunger Vital Sign    Worried About Running Out of Food in the Last Year: Never true    Ran Out of Food in the Last Year: Never true  Transportation Needs: No Transportation Needs (09/24/2022)   PRAPARE - Hydrologist (Medical): No    Lack of Transportation (Non-Medical): No  Physical Activity: Not on file  Stress: Not on file  Social Connections: Not on file  Intimate Partner Violence: Not At Risk (09/24/2022)   Humiliation, Afraid, Rape, and Kick questionnaire    Fear of Current or Ex-Partner: No    Emotionally Abused: No    Physically Abused: No    Sexually Abused: No    Family History  Problem Relation Age of Onset   Varicose Veins Mother    Heart disease Mother    Diabetes Mother    COPD Father      Vitals:   10/05/22 1355 10/05/22 1400 10/05/22 1410 10/05/22 1417  BP: 126/62 (!) 125/55 (!) 122/53 136/64  Pulse:  100  (!) 113  Resp: (!) 31 (!) 32 (!) 28 (!) 26  Temp:      TempSrc:      SpO2: 95% 97% 96% 96%   Weight:      Height:        PHYSICAL EXAM General: Pleasant acutely ill-appearing middle-aged Caucasian male, laying at incline in hospital bed, no family at bedside.  HEENT:  Normocephalic and atraumatic.  Neck:  No JVD.  Lungs: Somewhat short of breath appearing on room air, Shallow respirations with decreased bibasilar breath sounds without appreciable wheezes. Heart: irregularly irregular rhythm with controlled rate. Normal S1 and S2 without gallops or murmurs.  Abdomen: Distended appearing.  Msk: Generalized weakness. Extremities: Warm and well perfused. No clubbing, cyanosis.  Generally edematous upper and lower extremities.  SCDs Neuro: Alert and oriented X 3.   Psych: Appropriate for situation  Labs: Basic Metabolic Panel: Recent Labs    10/04/22 0538 10/05/22 0440  NA 134* 136  K 4.1 4.4  CL 102 106  CO2 23 25  GLUCOSE 313* 248*  BUN 33* 31*  CREATININE 1.67* 1.32*  CALCIUM 8.4* 8.4*  MG 1.7 1.8  PHOS 3.1 3.1    Liver Function Tests: No results for input(s): "AST", "ALT", "ALKPHOS", "BILITOT", "PROT", "ALBUMIN" in the last 72 hours.  No results for input(s): "LIPASE", "AMYLASE" in the last 72 hours. CBC: Recent Labs    10/04/22 0538 10/04/22 2200 10/05/22 0440  WBC 36.9* 34.7* 31.9*  NEUTROABS 27.2*  --  24.6*  HGB 7.8* 8.0* 7.2*  HCT 24.1* 26.0* 23.3*  MCV 73.9* 76.0* 77.9*  PLT 367 388 405*    Cardiac Enzymes: No results for input(s): "CKTOTAL", "CKMB", "CKMBINDEX", "TROPONINIHS" in the last 72 hours.  BNP: No results for input(s): "BNP" in the last 72 hours.  D-Dimer: No results for input(s): "DDIMER" in the last 72 hours. Hemoglobin A1C: No results for input(s): "HGBA1C" in the last 72 hours. Fasting Lipid Panel: Recent Labs    10/05/22 0440  TRIG 138    Thyroid Function Tests: No results for input(s): "TSH", "T4TOTAL", "T3FREE", "THYROIDAB" in the last 72 hours.  Invalid input(s): "FREET3"  Anemia Panel: No results for  input(s): "VITAMINB12", "FOLATE", "FERRITIN", "TIBC", "IRON", "RETICCTPCT" in the last 72 hours.   Radiology: CT ABDOMEN PELVIS WO CONTRAST  Result Date: 10/03/2022 CLINICAL DATA:  Abdominal pain.  Postop leukocytosis. Status post right hemicolectomy for colon mass on 09/24/2022. Patient was re-explored 09/28/2022 and found to have purulent fluid in the right upper quadrant with possible Peri anastomotic abscess. EXAM: CT ABDOMEN AND PELVIS WITHOUT CONTRAST TECHNIQUE: Multidetector CT imaging of the abdomen and pelvis was performed following the standard protocol without IV contrast. RADIATION DOSE REDUCTION: This exam was performed according to the departmental dose-optimization program which includes automated exposure control, adjustment of the mA and/or kV according to patient size and/or use of iterative reconstruction technique. COMPARISON:  09/28/2022 FINDINGS: Lower chest: Dependent collapse/consolidation is seen in both lungs with small bilateral pleural effusions. Hepatobiliary: No suspicious focal abnormality in the liver on this study without intravenous contrast. Gallbladder surgically absent. No intrahepatic or extrahepatic biliary dilation. Pancreas: No focal mass lesion. No dilatation of the main duct. No intraparenchymal cyst. No peripancreatic edema. Spleen: No splenomegaly. No focal mass lesion. Adrenals/Urinary Tract: No adrenal nodule or mass. Kidneys unremarkable. No evidence for hydroureter. Bladder is decompressed by Foley catheter. Gas in the bladder lumen is compatible with the instrumentation. Stomach/Bowel: Oral contrast material is seen in the stomach. Duodenum is normally positioned as is the ligament of Treitz. No small bowel wall thickening. No small bowel dilatation. Loop ileostomy noted right abdomen. Right hemicolectomy. Transverse and left colon is opacified and decompressed. Surgical drain is identified in the right abdomen adjacent to the ileocolic anastomosis.  Vascular/Lymphatic: There is mild atherosclerotic calcification of the abdominal aorta without aneurysm. There is no gastrohepatic or hepatoduodenal ligament lymphadenopathy. No retroperitoneal or mesenteric lymphadenopathy. No pelvic sidewall lymphadenopathy. Reproductive: The prostate gland and seminal vesicles are unremarkable. Other: Small volume free fluid is seen adjacent to the liver, similar to prior. Scattered collections of fluid are seen in the mesentery and bilateral paracolic gutters, also similar to prior. Volume of intraperitoneal gas has decreased in the interval. 14.7 x 2.0 cm relatively well-defined Jeani Hawking shaped collection of fluid is identified anterior abdomen (image 53/2). Open midline wound evident. Musculoskeletal: No worrisome lytic or sclerotic osseous abnormality. IMPRESSION: 1. Decreasing intraperitoneal free gas since 09/28/2022 as expected. Free fluid around the liver is similar to prior with multiple fluid collections in the mesentery in fluid pooling in the para colic gutters bilaterally. No definite abscess at this time although lack of intravenous contrast lessens sensitivity for detection. Superinfection of any of the intraperitoneal fluid collections cannot be excluded on this study. 2. Right abdominal loop ileostomy in this patient with right hemicolectomy. 3. Dependent collapse/consolidation in both lungs with small bilateral pleural effusions. 4.  Aortic Atherosclerosis (ICD10-I70.0). Electronically Signed   By: Misty Stanley M.D.   On: 10/03/2022 15:48   ECHOCARDIOGRAM COMPLETE  Result Date: 09/29/2022    ECHOCARDIOGRAM REPORT   Patient Name:   Nicolas Zuniga Date of Exam: 09/29/2022 Medical Rec #:  [id number]     Height:       74.0 in Accession #:    [id number]    Weight:       260.1 lb Date of Birth:  09-Aug-1956      BSA:          2.430 m Patient Age:    65 years      BP:           126/74 mmHg Patient Gender: M             HR:           83 bpm. Exam Location:  [facility]  Procedure: 2D Echo, Color Doppler  and Cardiac Doppler Indications:     Abnormal ECG R94.31  History:         Patient has prior history of Echocardiogram examinations, most                  recent 07/09/2016. Risk Factors:Hypertension and Diabetes.                  Paroxysmal Afib.  Sonographer:     Sherrie Sport Referring Phys:  [id number] Teressa Lower Diagnosing Phys: Serafina Royals MD  Sonographer Comments: Echo performed with patient supine and on artificial respirator, Technically challenging study due to limited acoustic windows and no subcostal window. IMPRESSIONS  1. Left ventricular ejection fraction, by estimation, is 65 to 70%. The left ventricle has normal function. The left ventricle has no regional wall motion abnormalities. Left ventricular diastolic parameters were normal.  2. Right ventricular systolic function is normal. The right ventricular size is normal.  3. The mitral valve is normal in structure. Trivial mitral valve regurgitation.  4. The aortic valve is normal in structure. Aortic valve regurgitation is not visualized. FINDINGS  Left Ventricle: Left ventricular ejection fraction, by estimation, is 65 to 70%. The left ventricle has normal function. The left ventricle has no regional wall motion abnormalities. The left ventricular internal cavity size was small. There is no left ventricular hypertrophy. Left ventricular diastolic parameters were normal. Right Ventricle: The right ventricular size is normal. No increase in right ventricular wall thickness. Right ventricular systolic function is normal. Left Atrium: Left atrial size was normal in size. Right Atrium: Right atrial size was normal in size. Pericardium: There is no evidence of pericardial effusion. Mitral Valve: The mitral valve is normal in structure. Trivial mitral valve regurgitation. Tricuspid Valve: The tricuspid valve is normal in structure. Tricuspid valve regurgitation is trivial. Aortic Valve: The aortic valve is normal in  structure. Aortic valve regurgitation is not visualized. Aortic valve mean gradient measures 2.0 mmHg. Aortic valve peak gradient measures 3.4 mmHg. Aortic valve area, by VTI measures 3.50 cm. Pulmonic Valve: The pulmonic valve was normal in structure. Pulmonic valve regurgitation is not visualized. Aorta: The aortic root and ascending aorta are structurally normal, with no evidence of dilitation. IAS/Shunts: No atrial level shunt detected by color flow Doppler.  LEFT VENTRICLE PLAX 2D LVIDd:         4.60 cm LVIDs:         3.30 cm LV PW:         1.20 cm LV IVS:        0.80 cm LVOT diam:     2.00 cm LV SV:         43 LV SV Index:   18 LVOT Area:     3.14 cm  RIGHT VENTRICLE RV Basal diam:  4.10 cm RV Mid diam:    3.80 cm RV S prime:     11.40 cm/s TAPSE (M-mode): 2.0 cm LEFT ATRIUM           Index        RIGHT ATRIUM           Index LA diam:      3.10 cm 1.28 cm/m   RA Area:     14.80 cm LA Vol (A2C): 40.0 ml 16.46 ml/m  RA Volume:   37.20 ml  15.31 ml/m LA Vol (A4C): 70.1 ml 28.84 ml/m  AORTIC VALVE AV Area (Vmax):    2.22 cm AV Area (Vmean):   2.37  cm AV Area (VTI):     3.50 cm AV Vmax:           91.60 cm/s AV Vmean:          67.100 cm/s AV VTI:            0.122 m AV Peak Grad:      3.4 mmHg AV Mean Grad:      2.0 mmHg LVOT Vmax:         64.70 cm/s LVOT Vmean:        50.700 cm/s LVOT VTI:          0.136 m LVOT/AV VTI ratio: 1.11  AORTA Ao Root diam: 3.20 cm MITRAL VALVE               TRICUSPID VALVE MV Area (PHT): 3.40 cm    TR Peak grad:   19.4 mmHg MV Decel Time: 223 msec    TR Vmax:        220.00 cm/s MV E velocity: 85.90 cm/s                            SHUNTS                            Systemic VTI:  0.14 m                            Systemic Diam: 2.00 cm Serafina Royals MD Electronically signed by Serafina Royals MD Signature Date/Time: 09/29/2022/12:21:30 PM    Final    DG Abd 1 View  Result Date: 09/28/2022 CLINICAL DATA:  Enteric catheter placement, postop EXAM: ABDOMEN - 1 VIEW COMPARISON:   09/26/2022 FINDINGS: Frontal view of the lower chest and upper abdomen demonstrates enteric catheter passing below diaphragm, tip projecting over the gastric antrum. Left basilar consolidation compatible with atelectasis. Bowel gas pattern is unremarkable. IMPRESSION: 1. Enteric catheter tip projecting over the gastric antrum. Electronically Signed   By: Randa Ngo M.D.   On: 09/28/2022 15:52   DG Chest Port 1 View  Result Date: 09/28/2022 CLINICAL DATA:  Postop, intubated EXAM: PORTABLE CHEST 1 VIEW COMPARISON:  09/27/2022 FINDINGS: Single frontal view of the chest demonstrates endotracheal tube overlying tracheal air column tip at level of thoracic inlet. Enteric catheter passes below diaphragm tip excluded by collimation. Right-sided PICC tip overlies the right atrium. Numerous cardiac leads overlie the central chest. The cardiac silhouette is unremarkable. Lung volumes are diminished, with left basilar consolidation most consistent with atelectasis. Trace left pleural effusion. No pneumothorax. No acute bony abnormalities. IMPRESSION: 1. Support devices as above. 2. Increasing left basilar consolidation, favor atelectasis over pneumonia. 3. Trace left pleural effusion. Electronically Signed   By: Randa Ngo M.D.   On: 09/28/2022 15:51   CT ABDOMEN PELVIS WO CONTRAST  Result Date: 09/28/2022 CLINICAL DATA:  Status post right hemicolectomy 09/24/2022. Anastomotic complication suspected. EXAM: CT ABDOMEN AND PELVIS WITHOUT CONTRAST TECHNIQUE: Multidetector CT imaging of the abdomen and pelvis was performed following the standard protocol without IV contrast. RADIATION DOSE REDUCTION: This exam was performed according to the departmental dose-optimization program which includes automated exposure control, adjustment of the mA and/or kV according to patient size and/or use of iterative reconstruction technique. COMPARISON:  09/27/2022 FINDINGS: Lower chest: Bibasilar atelectasis noted with tiny  bilateral pleural effusions. Hepatobiliary: No suspicious focal abnormality in the liver  on this study without intravenous contrast. Gallbladder is surgically absent. No intrahepatic or extrahepatic biliary dilation. Pancreas: No focal mass lesion. No dilatation of the main duct. No intraparenchymal cyst. No peripancreatic edema. Spleen: No splenomegaly. No focal mass lesion. Adrenals/Urinary Tract: No adrenal nodule or mass. Contrast in the kidneys compatible with residual from yesterday's infuse scan. Small cyst noted left kidney. No evidence for hydroureter. The urinary bladder appears normal for the degree of distention. Stomach/Bowel: Stomach is distended with gas and fluid. Duodenum is normally positioned as is the ligament of Treitz. No small bowel wall thickening. No small bowel dilatation. The distal ileum at the ileocolic anastomosis in the proximal transverse colon at the anastomosis is well opacified with enteric contrast material. While there is no evidence for gross contrast extravasation at the level of the anastomosis, the small fluid collection identified on yesterday's study has increased in attenuation in the interval (26 Hounsfield units previously versus 67 Hounsfield units today). This increase in attenuation is concerning for some trace spill of contrast from the anastomosis into the collection although blood products or infectious debris could cause increase in attenuation. This finding is associated with a slight increase in fluid volume in the right paracolic gutter. Diverticular changes are seen in the left colon as before. Vascular/Lymphatic: There is mild atherosclerotic calcification of the abdominal aorta without aneurysm. There is no gastrohepatic or hepatoduodenal ligament lymphadenopathy. No retroperitoneal or mesenteric lymphadenopathy. No pelvic sidewall lymphadenopathy. Reproductive: The hypoattenuating collection seen previously in the prostate gland are less prominent today.  Other: Free fluid and free gas in the peritoneal cavity is again noted. Free gas is not unexpected 4 days out from surgery. As noted above, the volume of fluid in the right paracolic gutter is slightly increased in the interval. Musculoskeletal: Gas in the midline subcutaneous tissues deep to the staple line has increased in the interval No worrisome lytic or sclerotic osseous abnormality. IMPRESSION: 1. While there is no evidence for gross enteric contrast extravasation at the level of the ileocolic anastomosis, the small fluid collection adjacent to the anastomosis identified on yesterday's study has increased in attenuation in the interval. This increase in attenuation is concerning for some trace spill of contrast from the anastomosis into the collection. 2. Slight increase in fluid volume in the right paracolic gutter with similar fluid volume around the liver and in the left paracolic gutter. Collections of interloop mesenteric fluid are not substantially changed. 3. Free gas in the peritoneal cavity is not unexpected 4 days out from surgery. 4. Gas in the midline subcutaneous tissues deep to the staple line has increased in the interval. 5. Contrast in the kidneys compatible with residual from yesterday's infused scan. 6. Bibasilar atelectasis with tiny bilateral pleural effusions. 7. Aortic Atherosclerosis (ICD10-I70.0). Findings were Dr. Dahlia Byes at approximately 1105 hours on 09/28/2022. Electronically Signed   By: Misty Stanley M.D.   On: 09/28/2022 11:22   DG Chest Port 1 View  Result Date: 09/27/2022 CLINICAL DATA:  PICC line placement. EXAM: PORTABLE CHEST 1 VIEW COMPARISON:  Abdominal series 09/26/2022. CT abdomen and pelvis 09/27/2022 FINDINGS: Shallow inspiration with atelectasis in the lung bases. Cardiac enlargement. No airspace disease or consolidation in the lungs. A right PICC line has been placed with tip over the cavoatrial junction. No pneumothorax. Pneumoperitoneum is demonstrated below  the right hemidiaphragm as seen on prior studies, likely postoperative. IMPRESSION: 1. Right PICC line appears in satisfactory position. 2. Shallow inspiration with atelectasis in the lung bases. Cardiac enlargement. 3.  Pneumoperitoneum as seen on prior studies, probably postoperative. Electronically Signed   By: Lucienne Capers M.D.   On: 09/27/2022 19:25   [place] EKG SITE RITE  Result Date: 09/27/2022 If Site Rite image not attached, placement could not be confirmed due to current cardiac rhythm.  CT ABDOMEN PELVIS W CONTRAST  Result Date: 09/27/2022 CLINICAL DATA:  66 year old male with RIGHT colectomy for adenocarcinoma and cholecystectomy performed on 09/24/2022. Hypotension and abdominal discomfort. EXAM: CT ABDOMEN AND PELVIS WITH CONTRAST TECHNIQUE: Multidetector CT imaging of the abdomen and pelvis was performed using the standard protocol following bolus administration of intravenous contrast. RADIATION DOSE REDUCTION: This exam was performed according to the departmental dose-optimization program which includes automated exposure control, adjustment of the mA and/or kV according to patient size and/or use of iterative reconstruction technique. CONTRAST:  16m OMNIPAQUE IOHEXOL 350 MG/ML SOLN COMPARISON:  09/17/2022 CT FINDINGS: Lower chest: Trace bilateral pleural effusions and bibasilar atelectasis noted. Hepatobiliary: The liver is unremarkable. The patient is status post cholecystectomy. There is no evidence of intrahepatic or extrahepatic biliary dilatation. Pancreas: A small amount of ill-defined fluid anterior to the pancreas is noted but the remainder of the pancreas is unremarkable. Spleen: Unremarkable Adrenals/Urinary Tract: No significant abnormalities of the kidneys, adrenal glands or bladder noted. No evidence of hydronephrosis. Stomach/Bowel: RIGHT colectomy and ileo colonic anastomosis noted. There is a small amount of ill-defined high density fluid along the suture line (images  35-39: Series 4), which may represent a small amount of extravasated contrast/leak. There is a small amount of ascites within the abdomen and pelvis with small to moderate amount of pneumoperitoneum within the abdomen. There is no evidence of focal collection/defined abscess. There is no evidence of bowel obstruction. No definite bowel wall thickening is identified. Vascular/Lymphatic: Aortic atherosclerosis. No enlarged abdominal or pelvic lymph nodes. Reproductive: 2 low-density areas within the prostate gland are noted, measuring 1.7 cm on the LEFT and 1 cm on the RIGHT. Other: Ill-defined stranding within the abdomen and pelvis noted likely recent surgical changes. Musculoskeletal: No acute or suspicious bony abnormalities are identified. IMPRESSION: 1. Ill-defined slightly high density fluid along the ileocolic suture line, which may represent a small amount of extravasated contrast/leak. Small to moderate amount of pneumoperitoneum and small amount of ascites within the abdomen and pelvis, not unexpected given surgery but nonspecific. No evidence of focal collection/defined abscess. 2. Trace bilateral pleural effusions and bibasilar atelectasis. 3. 2 low-density areas within the prostate gland, nonspecific but correlate with PSA. 4.  Aortic Atherosclerosis (ICD10-I70.0). Critical Value/emergent results discussed by phone at the time of interpretation on 09/27/2022 at 1:50 pm to provider DIEGO PABON , who verbally acknowledged these results. Electronically Signed   By: JMargarette CanadaM.D.   On: 09/27/2022 13:51   CT Angio Chest Pulmonary Embolism (PE) W or WO Contrast  Result Date: 09/27/2022 CLINICAL DATA:  Shortness of breath. EXAM: CT ANGIOGRAPHY CHEST WITH CONTRAST TECHNIQUE: Multidetector CT imaging of the chest was performed using the standard protocol during bolus administration of intravenous contrast. Multiplanar CT image reconstructions and MIPs were obtained to evaluate the vascular anatomy.  RADIATION DOSE REDUCTION: This exam was performed according to the departmental dose-optimization program which includes automated exposure control, adjustment of the mA and/or kV according to patient size and/or use of iterative reconstruction technique. CONTRAST:  1040mOMNIPAQUE IOHEXOL 350 MG/ML SOLN COMPARISON:  September 17, 2022. FINDINGS: Cardiovascular: Satisfactory opacification of the pulmonary arteries to the segmental level. No evidence of pulmonary embolism. Normal heart size.  No pericardial effusion. Mediastinum/Nodes: No enlarged mediastinal, hilar, or axillary lymph nodes. Thyroid gland, trachea, and esophagus demonstrate no significant findings. Lungs/Pleura: No pneumothorax is noted. Minimal bilateral pleural effusions are noted with adjacent subsegmental atelectasis. Upper Abdomen: Pneumoperitoneum is noted in the visualized portion of upper abdomen consistent with the reported history of recent laparoscopic right colectomy and cholecystectomy. Musculoskeletal: No chest wall abnormality. No acute or significant osseous findings. Review of the MIP images confirms the above findings. IMPRESSION: No definite evidence of pulmonary embolus. Minimal bilateral pleural effusions are noted with adjacent subsegmental atelectasis. Pneumoperitoneum is noted in visualized portion of upper abdomen consistent with reported history of recent laparoscopic right colectomy and cholecystectomy. Electronically Signed   By: Marijo Conception M.D.   On: 09/27/2022 13:38   DG ABD ACUTE 2+V W 1V CHEST  Result Date: 09/26/2022 CLINICAL DATA:  Ileus following open right colectomy on 09/24/2022 EXAM: DG ABDOMEN ACUTE WITH 1 VIEW CHEST COMPARISON:  09/17/2022 FINDINGS: Gaseous distension of the stomach. Air is seen within the colon. No dilated loops of small bowel. Small-moderate volume pneumoperitoneum. Numerous surgical clips within the right hemiabdomen. Midline abdominal skin staples. Heart size within normal limits.  Low lung volumes. Trace left pleural effusion with left basilar atelectasis. No pneumothorax. IMPRESSION: 1. Small-moderate volume pneumoperitoneum, likely related to recent surgery. 2. Gaseous distension of the stomach. No dilated loops of small bowel to suggest obstruction. Electronically Signed   By: Davina Poke D.O.   On: 09/26/2022 10:57   CT Chest W Contrast  Result Date: 09/18/2022 CLINICAL DATA:  Colon cancer diagnosed on 09/08/2022. Elevated CEA. Staging evaluation. EXAM: CT CHEST, ABDOMEN, AND PELVIS WITH CONTRAST TECHNIQUE: Multidetector CT imaging of the chest, abdomen and pelvis was performed following the standard protocol during bolus administration of intravenous contrast. RADIATION DOSE REDUCTION: This exam was performed according to the departmental dose-optimization program which includes automated exposure control, adjustment of the mA and/or kV according to patient size and/or use of iterative reconstruction technique. CONTRAST:  168m OMNIPAQUE IOHEXOL 300 MG/ML  SOLN COMPARISON:  Abdomen and pelvis CT dated 03/07/2020 FINDINGS: CT CHEST FINDINGS Cardiovascular: Minimal coronary artery calcification. Normal sized heart. Mediastinum/Nodes: No enlarged mediastinal, hilar, or axillary lymph nodes. Thyroid gland, trachea, and esophagus demonstrate no significant findings. Lungs/Pleura: Minimal linear atelectasis or scarring at the left lung base. 5 x 3 mm left lower lobe nodule with a mean diameter of 4 mm on image number 100/5. Taking differences in slice thickness into account, this demonstrates little if any change since 03/07/2020. There is also a small calcified granuloma more superiorly in the left lower lobe on image number 77/5. On image number 97/5 and coronal image number 58/6, there is a 4 x 2 mm oval nodular density with a mean diameter of 3 mm. It is difficult determine if this is a lung nodule or prominent vessel branch point. No other lung nodules seen. No pleural fluid.  Musculoskeletal: Thoracic and lower cervical spine degenerative changes. No evidence of bony metastatic disease. CT ABDOMEN PELVIS FINDINGS Hepatobiliary: Multiple small noncalcified gallstones in the dependent portion of the gallbladder measuring up 5 mm in maximum diameter each. No gallbladder wall thickening or pericholecystic fluid. Unremarkable liver. No liver masses are seen. Pancreas: Unremarkable. No pancreatic ductal dilatation or surrounding inflammatory changes. Spleen: Normal in size without focal abnormality. Adrenals/Urinary Tract: Normal appearing adrenal glands. Small bilateral renal cysts. These do not need imaging follow-up. Poorly distended urinary bladder. No ureteral abnormalities seen. Stomach/Bowel: Interval concentric mass involving  the right colon and ileocecal valve measuring 7.3 cm in length on coronal image number 88/6 with a maximum wall thickness of 1.7 cm on the same image. This mass is confluent with an immediately adjacent mass medially with central low density, measuring 3.8 x 3.0 cm on image number 92/3 and 4.1 cm in length on coronal image number 83/6. There are multiple adjacent lymph node medial to these masses. The largest has a short axis diameter of 2.4 cm on image number 86/3. Multiple descending and sigmoid colon diverticula. Mild wall thickening involving the terminal ileum at the ileocecal valve. The remainder of the small bowel is unremarkable. Normal-appearing appendix and stomach. Vascular/Lymphatic: Mild atheromatous arterial calcifications without aneurysm. Multiple enlarged mesenteric lymph nodes medial to the right colon mass, as described above. No other enlarged lymph nodes seen. Reproductive: Prostate is unremarkable. Other: No abdominal wall hernia or abnormality. No abdominopelvic ascites. Musculoskeletal: Lumbar spine degenerative changes. Mild bilateral hip degenerative changes. No evidence of bony metastatic disease. IMPRESSION: 1. Interval concentric mass  involving the right colon, ileocecal valve and distal terminal ileum, compatible with the patient's known colon carcinoma. No associated obstruction. 2. Adjacent mass with central low density, compatible with local extension of necrotic tumor or an enlarged, necrotic metastatic lymph node. 3. Multiple adjacent enlarged mesenteric lymph nodes medial to these masses, compatible with metastatic adenopathy. 4. Colonic diverticulosis. 5. Cholelithiasis. 6. 4 mm mean diameter left lower lobe nodule possible 3 mm mean diameter right middle lobe nodule. These are felt to have a low likelihood of representing metastatic disease and could be followed on subsequent examinations. Electronically Signed   By: Claudie Revering M.D.   On: 09/18/2022 17:04   CT Abdomen Pelvis W Contrast  Result Date: 09/18/2022 CLINICAL DATA:  Colon cancer diagnosed on 09/08/2022. Elevated CEA. Staging evaluation. EXAM: CT CHEST, ABDOMEN, AND PELVIS WITH CONTRAST TECHNIQUE: Multidetector CT imaging of the chest, abdomen and pelvis was performed following the standard protocol during bolus administration of intravenous contrast. RADIATION DOSE REDUCTION: This exam was performed according to the departmental dose-optimization program which includes automated exposure control, adjustment of the mA and/or kV according to patient size and/or use of iterative reconstruction technique. CONTRAST:  117m OMNIPAQUE IOHEXOL 300 MG/ML  SOLN COMPARISON:  Abdomen and pelvis CT dated 03/07/2020 FINDINGS: CT CHEST FINDINGS Cardiovascular: Minimal coronary artery calcification. Normal sized heart. Mediastinum/Nodes: No enlarged mediastinal, hilar, or axillary lymph nodes. Thyroid gland, trachea, and esophagus demonstrate no significant findings. Lungs/Pleura: Minimal linear atelectasis or scarring at the left lung base. 5 x 3 mm left lower lobe nodule with a mean diameter of 4 mm on image number 100/5. Taking differences in slice thickness into account, this  demonstrates little if any change since 03/07/2020. There is also a small calcified granuloma more superiorly in the left lower lobe on image number 77/5. On image number 97/5 and coronal image number 58/6, there is a 4 x 2 mm oval nodular density with a mean diameter of 3 mm. It is difficult determine if this is a lung nodule or prominent vessel branch point. No other lung nodules seen. No pleural fluid. Musculoskeletal: Thoracic and lower cervical spine degenerative changes. No evidence of bony metastatic disease. CT ABDOMEN PELVIS FINDINGS Hepatobiliary: Multiple small noncalcified gallstones in the dependent portion of the gallbladder measuring up 5 mm in maximum diameter each. No gallbladder wall thickening or pericholecystic fluid. Unremarkable liver. No liver masses are seen. Pancreas: Unremarkable. No pancreatic ductal dilatation or surrounding inflammatory changes. Spleen: Normal  in size without focal abnormality. Adrenals/Urinary Tract: Normal appearing adrenal glands. Small bilateral renal cysts. These do not need imaging follow-up. Poorly distended urinary bladder. No ureteral abnormalities seen. Stomach/Bowel: Interval concentric mass involving the right colon and ileocecal valve measuring 7.3 cm in length on coronal image number 88/6 with a maximum wall thickness of 1.7 cm on the same image. This mass is confluent with an immediately adjacent mass medially with central low density, measuring 3.8 x 3.0 cm on image number 92/3 and 4.1 cm in length on coronal image number 83/6. There are multiple adjacent lymph node medial to these masses. The largest has a short axis diameter of 2.4 cm on image number 86/3. Multiple descending and sigmoid colon diverticula. Mild wall thickening involving the terminal ileum at the ileocecal valve. The remainder of the small bowel is unremarkable. Normal-appearing appendix and stomach. Vascular/Lymphatic: Mild atheromatous arterial calcifications without aneurysm. Multiple  enlarged mesenteric lymph nodes medial to the right colon mass, as described above. No other enlarged lymph nodes seen. Reproductive: Prostate is unremarkable. Other: No abdominal wall hernia or abnormality. No abdominopelvic ascites. Musculoskeletal: Lumbar spine degenerative changes. Mild bilateral hip degenerative changes. No evidence of bony metastatic disease. IMPRESSION: 1. Interval concentric mass involving the right colon, ileocecal valve and distal terminal ileum, compatible with the patient's known colon carcinoma. No associated obstruction. 2. Adjacent mass with central low density, compatible with local extension of necrotic tumor or an enlarged, necrotic metastatic lymph node. 3. Multiple adjacent enlarged mesenteric lymph nodes medial to these masses, compatible with metastatic adenopathy. 4. Colonic diverticulosis. 5. Cholelithiasis. 6. 4 mm mean diameter left lower lobe nodule possible 3 mm mean diameter right middle lobe nodule. These are felt to have a low likelihood of representing metastatic disease and could be followed on subsequent examinations. Electronically Signed   By: Claudie Revering M.D.   On: 09/18/2022 17:04    ECHO 04/2022 EF >55%, G1 DD with normal LV and RV systolic function  TELEMETRY reviewed by me (LT) 10/05/2022 : AF rate acceptable in the 90s-low 100s  EKG reviewed by me: 09/25/2022 sinus rhythm rate 60s  Data reviewed by me (LT) 10/05/2022: Surgery note, hospitalist progress note, nephrology progress note, CBC, BMP, I's and O's, vitals, telemetry  Active Problems:   Diabetes ([place])   Essential hypertension   Paroxysmal A-fib ([facility])   S/P right colectomy   S/P partial resection of colon   Hypotension   Hyponatremia   AKI (acute kidney injury) ([place])   Postoperative intra-abdominal abscess   Adenocarcinoma of colon ([place])   Persistent atrial fibrillation ([facility])   Sepsis ([place])   Diabetic ketoacidosis without coma associated with type 2 diabetes mellitus ([place])    Generalized anxiety disorder    ASSESSMENT AND PLAN:  Nicolas Zuniga is a [phone] with a PMH of paroxysmal atrial fibrillation (on flecainide, metoprolol, and Eliquis), diabetes, hypertension, recently diagnosed adenocarcinoma of the colon who presented to [facility] for and elective laparoscopic right colectomy ultimately converted to open right colectomy and cholecystectomy.  Postoperative course was complicated by hypotension and bradycardia the evening of postop day 1, concerning for beta-blocker toxicity for which he was started on glucagon infusion.  Rapid response was called overnight on 10/21 for atrial fibrillation with RVR and questionable VT, was started on amiodarone and diltiazem infusions with some heart rate response.  #Adenocarcinoma of the colon s/p open R colectomy & CCY 10/19 #S/p exploratory laparotomy, abdominal washout, and diverting loop ileostomy 10/23 #Intra-abdominal abscess with ?Septic  shock -Remains vasopressor support entirely since 10/25. -Procalcitonin trend 21.52-18.25-20.55, leukocytosis worsening from 14.06-22-28.5 today -Management per primary team and surgery  #Paroxysmal atrial fibrillation with RVR Was previously on flecainide, metoprolol, and Eliquis which have all been held after rapid response on 10/21.  His heart rates were difficult to control, and was in A-fib in the 120s at rest with paroxysms to the 150s throughout the day of 10/23 and converted to atrial flutter with controlled ventricular response in the evening.  Bradycardic overnight so diltiazem and Precedex infusions were discontinued on 10/24.  Since 10/25 he has remained in atrial fibrillation with acceptable rate control in the 90s to low 100s primarily. -Continue to treat causes of increased adrenergic tone including pain, infection, hypoxia -Monitor and replete electrolytes to maintain a K >4, mag >2 -S/p amiodarone infusion, continue amiodarone 200 mg daily  -continue low-dose metoprolol  tartrate 25 mg twice daily -Revisit ablation on an outpatient basis with Dr. Mylinda Latina. -CHA2DS2-VASc 3 (age, htn, dm2).   -Continue heparin infusion while inpatient, on pause today in anticipation of further procedures with IR, restart as able -restart Eliquis 5 mg twice daily at discharge or once no further procedures are planned if okay from a surgical standpoint.  #Chronic HFpEF (EF 65-70% 09/2022, previous >55% 04/2022) Remains net + 3.2 L, required IVF for hypotension earlier in hospital course.  Not significantly clinically volume overloaded -Agree with IV Lasix 40 mg twice daily per primary  #Acute renal failure, resolved Much improved with diuresis.  -BUN/creatinine 31/1.32 and GFR 59 today AKI likely ATN with significant hypotension requiring vasopressor support.   -Nephrology consulted by primary team, appreciate their assistance  #Postoperative anemia Hgb drift down overnight from 8.0 to 7.2, s/p 1 unit of PRBCs  This patient's plan of care was discussed and created with Dr. Clayborn Bigness and he is in agreement.  Signed: Tristan Schroeder , PA-C 10/05/2022, 2:32 PM [facility] ([facility] Cardiology

## 2022-10-05 NOTE — Procedures (Signed)
Interventional Radiology Procedure Note  Procedure: CT aspiration of left anterior abdominal fluid collection, aspiration of left posterolateral abdominal fluid collection and catheter drainage of perihepatic fluid collection  Complications: None  Estimated Blood Loss: < 10 mL  Findings:  Left anterior fluid collection: clear, yellow fluid. Sample sent for culture. 90 mL aspirated via 18 G trocar needle resulting in complete decompression.  Left posterolateral fluid collection: slightly turbid, darker yellow fluid. Sample sent for culture. 50 mL aspirated resulting in near-complete decompression.  Perihepatic fluid collection: clear, yellow fluid. 10 Fr drain placed in perihepatic fluid collection given its larger size. Drain attached to suction bulb drainage.  Will follow.  Venetia Night. Kathlene Cote, M.D Pager:  [phone]

## 2022-10-05 NOTE — Assessment & Plan Note (Signed)
Right colon cancer status post laparoscopic right colectomy and cholecystectomy on 09/24/2022. Intra-abdominal infection status post exploratory laparotomy with abdominal washout and diverting loop ileostomy on 10/23 10/29 Meropenem started with persistent leukocytosis and Zosyn discontinued. 10/30 interventional radiology did a CT aspiration of left anterior abdominal fluid collection and placed a drain for the  perihepatic fluid collection.

## 2022-10-05 NOTE — Care Management Important Message (Signed)
Important Message  Patient Details  Name: [name] MRN: [id number] Date of Birth: 03-Jun-1956   Medicare Important Message Given:  Yes     Dannette Barbara 10/05/2022, 12:21 PM

## 2022-10-05 NOTE — Progress Notes (Signed)
PT Cancellation Note  Patient Details Name: Nicolas Zuniga MRN: [id number] DOB: [birth year]   Cancelled Treatment:     Pt fatigued after working with OT this am and continued c/o of abdominal discomfort. Upon return, pt receiving a new drain placement as well as transfusion. Will continue PT next available date/time.   Josie Dixon 10/05/2022, 4:08 PM

## 2022-10-05 NOTE — Assessment & Plan Note (Signed)
Patient required intubation during the hospital course.  Currently on 3 L of oxygen pulse ox 96%.

## 2022-10-05 NOTE — PMR Pre-admission (Shared)
PMR Admission Coordinator Pre-Admission Assessment  Patient: Nicolas Zuniga is an 66 y.o., male MRN: [id number] DOB: September 02, 1956 Height: '6\' 2"'$  (188 cm) Weight: 67.1 kg  Insurance Information HMO: ***    PPO: ***     PCP: ***     IPA: ***     80/20: ***     OTHER: *** PRIMARY: Medicare Part A & B      Policy#: [id number]      Subscriber: patient CM Name: ***      Phone#: ***     Fax#: *** Pre-Cert#: ***      Employer: *** Benefits:  Phone #: ***     Name: *** Eff. Date: 03/07/21     Deduct: $1600      Out of Pocket Max: $0      Life Max: $0 CIR: 100% SNF: 20 full days Outpatient: 80% covered, 20% copay      Home Health: 100%       DME: 80% covered, 20% coinsurance       SECONDARY: BCBS Supplement      Policy#: [id number]     Phone#: [phone]    The "Data Collection Information Summary" for patients in Inpatient Rehabilitation Facilities with attached "Privacy Act [place] Records" was provided and verbally reviewed with: Patient and Family  Emergency Contact Information Contact Information     Name Relation Home Work Mobile   [name],[name] Spouse   [phone]       Current Medical History  Patient Admitting Diagnosis: Colon Cancer with R colectomy, cholelesthesis History of Present Illness: 66 year old male presented to [facility] 09/24/2022 for laparoscopic right colectomy that was converted to open right colectomy along with a cholecystectomy . 10/20: Seen early morning by Dr. Dahlia Byes (general surgery) and was doing well.  Starting around 6 PM he began to have hypotension 70/41 and the hospitalist service was consulted. Pt was given total of 25 g of albumin, 2 L of normal saline, and started on glucagon gtt concern for beta blocker toxicity. His metoprolol and flecainide were held. BP improved. Mild AKI on labs Cr 1.93 down form admission 1.23, likely hypovolemia.  10/21: BP soft but improved 115/53, 117/64. Cr improved 1.66. Hgb stable. Rapid response overnight, A-fib  RVR.  Transferred to ICU, amiodarone infusion, minimal response, diltiazem infusion.  Dr. Humphrey Rolls, cardiology, aware. Dr Dahlia Byes ordering CT chest/abdomen/pelvis, starting heparin drip. CT no PE, (+) Small bowel leak, certainly no contrast extravasation and seems contained plan repeat CT, hold off on surgical intervention unless emergent, starting abx. Remains on amiodarone and diltiazem infusions, heparin infusion paused out of concern for anastomotic leak, general surgeon ordering a repeat CT abdomen and pelvis this morning, with plans to take the patient back to the OR this afternoon. -Remains in atrial fibrillation with RVR, rates in the 120s to 140s. 10/23: Patient went for repeat CT noting increased extravasation concerning for anastomotic leak.  Patient taken back to the OR and well no leak found, underwent bowel follow-through and washout.  Postop, patient in ICU and still intubated.  ABG notes severe respiratory acidosis.  10/24: Pt with worsening acute renal failure Nephrology consulted.  Pt with bradycardia overnight cardizem and precedex gtts discontinued.  Pt remains on amiodarone gtt at 30 mg/hr  Pt alert and following commands plans for possible SBT today ~ EXTUBATED 10/25: Tolerating extubation from respiratory standpoint. Off Vasopressors. Slight worsening of AKI, starting to make urine.  DKA resolved, weaning off insulin gtt to SQ insulin.  Atrial fibrillation rate controlled on Amiodarone. 10/30 Given his persistent leukocytosis, we will proceed with aspiration vs drain placement of intra-abdominal fluid to ensure this is not source. Therapies are recommending intensive rehab program.    Patient's medical record from [facility] has been reviewed by the rehabilitation admission coordinator and physician.  Past Medical History  Past Medical History:  Diagnosis Date   Adenocarcinoma of colon ([place]) 09/08/2022   Cholelithiasis    Chronic gastritis    Colon polyps    Coronary artery calcification seen  on CT scan    Diastolic dysfunction    a.) TTE 04/09/2022: EF >55%, triv TR, G1DD   Diverticulosis    Hypertension    Long term current use of anticoagulant    a.) apixaban   Nephrolithiasis    Paroxysmal A-fib ([place])    a.) CHA2DS2VASc = 3 (age, HTN, T2DM);  b.) s/p DCCV 07/09/2016 (100 J x1), 12/22/2018 (120 J x1), 08/18/2021 (120 J x1), 03/11/2022 (120 J x1), 03/31/2022 (120 J x1); c.) rate/rhythm maintained on oral flecanide + metoprolol succinate; chronically anticoagulated with apixaban   T2DM (type 2 diabetes mellitus) ([place])     Has the patient had major surgery during 100 days prior to admission? Yes  Family History   family history includes COPD in his father; Diabetes in his mother; Heart disease in his mother; Varicose Veins in his mother.  Current Medications  Current Facility-Administered Medications:    0.9 %  sodium chloride infusion (Manually program via Guardrails IV Fluids), , Intravenous, Once, Tylene Fantasia, PA-C   [COMPLETED] amiodarone (PACERONE) tablet 200 mg, 200 mg, Oral, BID, 200 mg at 10/03/22 2114 **FOLLOWED BY** amiodarone (PACERONE) tablet 200 mg, 200 mg, Oral, Daily, Tang, Lily Michelle, PA-C, 200 mg at 10/05/22 0350   Chlorhexidine Gluconate Cloth 2 % PADS 6 each, 6 each, Topical, Daily, Pabon, [place] F, MD, 6 each at 10/05/22 0923   dextrose 50 % solution 0-50 mL, 0-50 mL, Intravenous, PRN, Teressa Lower, NP   escitalopram (LEXAPRO) tablet 5 mg, 5 mg, Oral, Daily, Lord, Jamison Y, NP, 5 mg at 10/05/22 0905   fentaNYL (SUBLIMAZE) 100 MCG/2ML injection, , , ,    fentaNYL (SUBLIMAZE) injection, , Intravenous, PRN, Aletta Edouard, MD, 50 mcg at 10/05/22 1337   furosemide (LASIX) injection 40 mg, 40 mg, Intravenous, BID, Wieting, Richard, MD, 40 mg at 10/05/22 0900   insulin aspart (novoLOG) injection 0-20 Units, 0-20 Units, Subcutaneous, Q4H, Dallie Piles, RPH, 7 Units at 10/05/22 0859   insulin aspart (novoLOG) injection 5 Units, 5 Units,  Subcutaneous, Q4H, Oswald Hillock, RPH, 5 Units at 10/05/22 0900   insulin detemir (LEVEMIR) injection 15 Units, 15 Units, Subcutaneous, BID, Dallie Piles, RPH, 15 Units at 10/05/22 0923   meropenem (MERREM) 1 g in sodium chloride 0.9 % 100 mL IVPB, 1 g, Intravenous, Q8H, Val Riles, MD, Stopped at 10/05/22 [phone]   metoprolol tartrate (LOPRESSOR) tablet 25 mg, 25 mg, Oral, BID, Tang, Lily Michelle, PA-C, 25 mg at 10/05/22 1829   midazolam (VERSED) 2 MG/2ML injection, , , ,    midazolam (VERSED) injection, , Intravenous, PRN, Aletta Edouard, MD, 1 mg at 10/05/22 1336   morphine (PF) 4 MG/ML injection 2 mg, 2 mg, Intravenous, Q3H PRN, Pabon, Diego F, MD, 2 mg at 10/03/22 0035   ondansetron (ZOFRAN-ODT) disintegrating tablet 4 mg, 4 mg, Oral, Q6H PRN **OR** ondansetron (ZOFRAN) injection 4 mg, 4 mg, Intravenous, Q6H PRN, Pabon, Marjory Lies, MD   Oral  care mouth rinse, 15 mL, Mouth Rinse, PRN, Pabon, Diego F, MD   oxyCODONE (Oxy IR/ROXICODONE) immediate release tablet 5-10 mg, 5-10 mg, Oral, Q4H PRN, Pabon, Diego F, MD, 5 mg at 10/04/22 0843   pantoprazole (PROTONIX) injection 40 mg, 40 mg, Intravenous, QHS, Pabon, Diego F, MD, 40 mg at 10/04/22 2052   prochlorperazine (COMPAZINE) tablet 10 mg, 10 mg, Oral, Q6H PRN **OR** prochlorperazine (COMPAZINE) injection 5-10 mg, 5-10 mg, Intravenous, Q6H PRN, Pabon, Diego F, MD   sodium chloride flush (NS) 0.9 % injection 10-40 mL, 10-40 mL, Intracatheter, Q12H, Pabon, Diego F, MD, 10 mL at 10/05/22 0904   sodium chloride flush (NS) 0.9 % injection 10-40 mL, 10-40 mL, Intracatheter, PRN, Pabon, Diego F, MD   TPN ADULT (ION), , Intravenous, Continuous TPN, Val Riles, MD, Last Rate: 90 mL/hr at 10/05/22 0900, Infusion Verify at 10/05/22 0900   TPN ADULT (ION), , Intravenous, Continuous TPN, Coulter, Hoyle Sauer, RPH  Patients Current Diet:  Diet Order             Diet NPO time specified  Diet effective now                   Precautions /  Restrictions Precautions Precautions: Fall Restrictions Weight Bearing Restrictions: No   Has the patient had 2 or more falls or a fall with injury in the past year? No  Prior Activity Level    Prior Functional Level Self Care: Did the patient need help bathing, dressing, using the toilet or eating? Independent  Indoor Mobility: Did the patient need assistance with walking from room to room (with or without device)? Independent  Stairs: Did the patient need assistance with internal or external stairs (with or without device)? Independent  Functional Cognition: Did the patient need help planning regular tasks such as shopping or remembering to take medications? Independent  Patient Information Are you of Hispanic, Latino/a,or Spanish origin?: A. No, not of Hispanic, Latino/a, or Spanish origin What is your race?: A. White Do you need or want an interpreter to communicate with a doctor or health care staff?: 0. No  Patient's Response To:  Health Literacy and Transportation Is the patient able to respond to health literacy and transportation needs?: Yes Health Literacy - How often do you need to have someone help you when you read instructions, pamphlets, or other written material from your doctor or pharmacy?: Never In the past 12 months, has lack of transportation kept you from medical appointments or from getting medications?: No In the past 12 months, has lack of transportation kept you from meetings, work, or from getting things needed for daily living?: No  Development worker, international aid / [place] Devices/Equipment: None Home Equipment: None  Prior Device Use: Indicate devices/aids used by the patient prior to current illness, exacerbation or injury? None of the above  Current Functional Level Cognition  Overall Cognitive Status: Within Functional Limits for tasks assessed Orientation Level: Oriented to person General Comments: fair command following     Extremity Assessment (includes Sensation/Coordination)  Upper Extremity Assessment: Generalized weakness  Lower Extremity Assessment: Generalized weakness    ADLs  Overall ADL's : Needs assistance/impaired General ADL Comments: MOD A don/doff gown seated EOB - requires BUE support static sitting. MAX A don B socks seated EOB.    Mobility  Overal bed mobility: Needs Assistance Bed Mobility: Supine to Sit, Sit to Supine Supine to sit: Max assist Sit to supine: Mod assist General bed mobility comments: deferred at  pt request 2/2 increased abdominal pain.    Transfers  Overall transfer level: Needs assistance Equipment used: Rolling walker (2 wheels) Transfers: Bed to chair/wheelchair/BSC Bed to/from chair/wheelchair/BSC transfer type:: Step pivot Step pivot transfers: Mod assist, +2 safety/equipment  Lateral/Scoot Transfers: Min assist General transfer comment: RN assist for line mgmt. MOD A to rise from chair height and cues for stepping chair>bed    Ambulation / Gait / Stairs / Wheelchair Mobility  Ambulation/Gait General Gait Details: deferred this session    Posture / Balance Dynamic Sitting Balance Sitting balance - Comments: initial MOD A improves to SBA Balance Overall balance assessment: Needs assistance Sitting-balance support: Bilateral upper extremity supported, Feet supported Sitting balance-Leahy Scale: Fair Sitting balance - Comments: initial MOD A improves to SBA Postural control: Posterior lean Standing balance support: Bilateral upper extremity supported Standing balance-Leahy Scale: Fair    Special needs/care consideration Skin ***   Previous Home Environment (from acute therapy documentation) Living Arrangements: Spouse/significant other  Lives With: Spouse Available Help at Discharge: Family Type of Home: House Home Layout: One level Home Access: Stairs to enter Entrance Stairs-Rails: Right Entrance Stairs-Number of Steps: 3 Bathroom Shower/Tub:  [place]: No  Discharge Living Setting Plans for Discharge Living Setting: Patient's home Type of Home at Discharge: House Discharge Home Layout: One level Discharge Home Access: Stairs to enter Entrance Stairs-Rails: Right Entrance Stairs-Number of Steps: 3 Discharge Bathroom Shower/Tub: Walk-in shower Does the patient have any problems obtaining your medications?: No  Social/Family/Support Systems Patient Roles: Spouse Anticipated Caregiver: Wife, [name] Anticipated Ambulance person Information: [phone] Caregiver Availability: 24/7 Discharge Plan Discussed with Primary Caregiver: Yes Is Caregiver In Agreement with Plan?: Yes Does Caregiver/Family have Issues with Lodging/Transportation while Pt is in Rehab?: No  Goals Patient/Family Goal for Rehab: Mod I PT, OT Expected length of stay: 12-14 days Pt/Family Agrees to Admission and willing to participate: Yes Program Orientation Provided & Reviewed with Pt/Caregiver Including Roles  & Responsibilities: Yes  Barriers to Discharge: Insurance for SNF coverage  Decrease burden of Care through IP rehab admission: Othern/a  Possible need for SNF placement upon discharge: not anticipated  Patient Condition: I have reviewed medical records from [facility], spoken with  TOC , and patient and spouse. I discussed via phone for inpatient rehabilitation assessment.  Patient will benefit from ongoing PT and OT, can actively participate in 3 hours of therapy a day 5 days of the week, and can make measurable gains during the admission.  Patient will also benefit from the coordinated team approach during an Inpatient Acute Rehabilitation admission.  The patient will receive intensive therapy as well as Rehabilitation physician, nursing, social worker, and care management interventions.  Due to bowel management, safety, skin/wound care, disease management, medication administration, pain management, and patient education  the patient requires 24 hour a day rehabilitation nursing.  The patient is currently *** with mobility and basic ADLs.  Discharge setting and therapy post discharge at home with home health is anticipated.  Patient has agreed to participate in the Acute Inpatient Rehabilitation Program and will admit ***.  Preadmission Screen Completed By:  Nelly Laurence, 10/05/2022 1:56 PM ______________________________________________________________________   Discussed status with Dr. Marland Kitchen on *** at *** and received approval for admission today.  Admission Coordinator:  Nelly Laurence, time ***/Date ***   Assessment/Plan: Diagnosis: Does the need for close, 24 hr/day Medical supervision in concert with the patient's rehab needs make it unreasonable for this patient to be served in a  less intensive setting? {yes_no_potentially:3041433} Co-Morbidities requiring supervision/potential complications: *** Due to {due OM:7672094}, does the patient require 24 hr/day rehab nursing? {yes_no_potentially:3041433} Does the patient require coordinated care of a physician, rehab nurse, PT, OT, and SLP to address physical and functional deficits in the context of the above medical diagnosis(es)? {yes_no_potentially:3041433} Addressing deficits in the following areas: {deficits:3041436} Can the patient actively participate in an intensive therapy program of at least 3 hrs of therapy 5 days a week? {yes_no_potentially:3041433} The potential for patient to make measurable gains while on inpatient rehab is {potential:3041437} Anticipated functional outcomes upon discharge from inpatient rehab: {functional outcomes:304600100} PT, {functional outcomes:304600100} OT, {functional outcomes:304600100} SLP Estimated rehab length of stay to reach the above functional goals is: *** Anticipated discharge destination: {anticipated dc setting:21604} 10. Overall Rehab/Functional Prognosis: {potential:3041437}   MD Signature: ***

## 2022-10-05 NOTE — Consult Note (Addendum)
[place] Hospital Day(s): 11.   Post op day(s): 7 Days Post-Op.   Interval History:  Patient seen and examined No acute events or new complaints overnight.  Patient reports he is doing okay this morning; biggest complaint is difficulty with secretion in mouth No abdominal pain, nausea, emesis, fever, chills  His leukocytosis is improved but remains quite high; 31.9K (from 34.7K) Hgb to 7.2 Renal function improving; sCr - 1.32; UO - 1325 ccs No electrolyte derangements  Surgical drain with minimal output; serous  He continues to have ostomy function  He has been on FLD; no appetite On TPN   Vital signs in last 24 hours: [min-max] current  Temp:  [97.8 F (36.6 C)-98.5 F (36.9 C)] 97.8 F (36.6 C) (10/30 0554) Pulse Rate:  [90-105] 104 (10/30 0554) Resp:  [19-30] 24 (10/30 0554) BP: (102-138)/(54-86) 119/67 (10/30 0554) SpO2:  [92 %-99 %] 92 % (10/30 0554) Weight:  [67.1 kg] 67.1 kg (10/30 0434)     Height: '6\' 2"'$  (188 cm) Weight: 67.1 kg BMI (Calculated): 18.98   Intake/Output last 2 shifts:  10/29 0701 - 10/30 0700 In: 4540 [I.V.:2859; IV Piggyback:200] Out: 1925 [Urine:1325; Stool:600]   Physical Exam:  Constitutional: Alert; awake, NAD Respiratory: No respiratory distress Gastrointestinal: Soft, he does not appear tender, non-distended, no rebound/guarding. Surgical drain with serosanguinous output. Diverting loop colostomy in right abdomen; there is gas and small amount of stool in bag Genitourinary: Foley in place; urine clear  Integumentary: Midline laparotomy healing via secondary intention, no erythema   Labs:     Latest Ref Rng & Units 10/05/2022    4:40 AM 10/04/2022   10:00 PM 10/04/2022    5:38 AM  CBC  WBC 4.0 - 10.5 K/uL 31.9  34.7  36.9   Hemoglobin 13.0 - 17.0 g/dL 7.2  8.0  7.8   Hematocrit 39.0 - 52.0 % 23.3  26.0  24.1   Platelets 150 - 400 K/uL 405  388  367       Latest Ref Rng & Units 10/05/2022     4:40 AM 10/04/2022    5:38 AM 10/03/2022    4:26 AM  CMP  Glucose 70 - 99 mg/dL 248  313  283   BUN 8 - 23 mg/dL 31  33  38   Creatinine 0.61 - 1.24 mg/dL 1.32  1.67  2.23   Sodium 135 - 145 mmol/L 136  134  136   Potassium 3.5 - 5.1 mmol/L 4.4  4.1  4.2   Chloride 98 - 111 mmol/L 106  102  105   CO2 22 - 32 mmol/L '25  23  25   '$ Calcium 8.9 - 10.3 mg/dL 8.4  8.4  8.4     Imaging studies: No new pertinent imaging studies   Assessment/Plan:  66 y.o. male 7 Days Post-Op s/p exploratory laparotomy, abdominal washout, and diverting loop ileostomy for intra-abdominal purulent fluid after initial open right colectomy with open cholecystectomy for right colon cancer and cholelithiasis on 10/19    - Discussed and reviewed case with interventional radiology this morning. Given his persistent leukocytosis, we will proceed with aspiration vs drain placement of intra-abdominal fluid to ensure this is not source. Appreciate their assistance   - Will also transfuse 1 unit pRBCs today   - NPO for planned IR procedure; Okay to resume diet following completion   - Continue TPN; at goal. PO intake is limiting ability to wean.              -  Continue IV Abx (Meropenem)             - Continue foley catheter for UO monitoring; sCr improved slightly, making urine; nephrology following             - Monitor abdominal examination; on-going ileostomy function             - Midline wound care: Pack daily with saline moistened gauze, cover, secure             - Continue surgical drain; monitor and record output             - Pain control prn; antiemetics prn             - Appreciate cardiology assistance as well   - Therapies on board; recommendations are CIR however patient did not work with therapies over the weekend   All of the above findings and recommendations were discussed with the patient, patient's family at bedside, and the medical team, and all of patient's and family's questions were answered  to their expressed satisfaction.  -- Edison Simon, PA-C [facility] 10/05/2022, 7:48 AM M-F: 7am - 4pm

## 2022-10-05 NOTE — Progress Notes (Signed)
PHARMACY - TOTAL PARENTERAL NUTRITION CONSULT NOTE   Indication: s/p exploratory laparotomy, abdominal washout, and diverting loop ileostomy on 10/23  Patient Measurements: Height: '6\' 2"'$  (188 cm) Weight: 67.1 kg (147 lb 14.9 oz) IBW/kg (Calculated) : 82.2 TPN AdjBW (KG): 91.1 Body mass index is 18.99 kg/m.  Assessment:  Nicolas Zuniga is a 66 y.o. male with PMH significant for recently diagnosed adenocarcinoma of the colon s/p R colectomy, DM, HTN, and A-fib s/p multiple ablations. Pharmacy has been consulted to manage and order TPN.  Glucose / Insulin: levemir 15u BID + novolog 5u Q4H + SSI (resistant) q4h +25 units insulin regular in TPN bag. BG 203-313.  Electrolytes: Na 136, K 4.4, Cl 106, Ca 8.4, Phos 3.1, Mg 1.8 Renal: SCr 1.32 (baseline 1-1.2) Hepatic: AST, ALT WNL Triglycerides: 10/30 WNL Intake/Output: 10/29 3059 in, 1925 out, net +1134 MIVF: none GI Imaging: 10/28 CTAP: decreasing intraperitoneal free gas since 09/28/2022, free fluid similar to previous, no definite abscess but cannot be excluded GI Surgeries / Procedures: s/p exploratory laparotomy, abdominal washout, and diverting loop ileostomy 10/23  Central access: 09/24/22 TPN start date: 09/29/22  Nutritional Goals: Goal TPN rate is 90 mL/hr (provides 145g of protein and 1648 kcals per day)  RD Assessment: Estimated Needs Total Energy Estimated Needs: 2700-3000kcal/day Total Protein Estimated Needs: 135-150g/day Total Fluid Estimated Needs: 2.2-2.5L/day  Current Nutrition: NPO  Plan:  Continue TPN at 90 mL/hr  Nutritional components: Amino acids (using 15% Clinisol): 144.7 grams Dextrose: 360.7 grams Lipids (using 20% SMOFlipids): 76 grams kCal: 2567 /24h Electrolytes in TPN: Na 9mq/L, K 573m/L, Ca 37m60mL, Mg 37mE20m, and Phos 20mm89m. Cl:Ac 1:1 Continue rSSI q4h  Continue levemir to 15 units BID + Novolog 5 units q4h + 25 units of insulin regular in TPN bag.  Completed 3 days of thiamine  10/03/2022 Monitor TPN labs on Mon/Thurs, daily until stable  Thank you for allowing pharmacy to be a part of this patient's care.   CarolGretel AcrermD PGY1 Pharmacy Resident 10/05/2022 9:42 AM

## 2022-10-05 NOTE — Assessment & Plan Note (Signed)
Surgical team ordered a unit of blood.

## 2022-10-05 NOTE — Consult Note (Signed)
NAME: Nicolas Zuniga  DOB: 05-Dec-1956  MRN: [id number]  Date/Time: 10/05/2022 6:17 PM  REQUESTING PROVIDER: Dr.Wieting Subjective:  REASON FOR CONSULT: intraabdominal abscess ?No hisotry from patient as he is lethargic and sob  Nicolas Zuniga is a 66 y.o. male with a history of HTN, Paroxysmal afib on eliquis, DM, adeno carcinoma of colon Was admitted for elective rt hemicolectomy on 09/24/22. The attempted lap colectomy was converted to laparotomy. HE also underwent cholecystectomy for chronic cholecystitis He started having increasing  cr, wbc and repeat cts were done' He was taken back for surgery on 09/28/22 and underwent exp laparotomy, abdominal washout, blake drain placement and diverting loop ileostomy . There was no leak identified. Midline laparotomy was allowed to heal by secondary intention Pt had a foley 09/28/22 and PICC placed on 09/27/22 Started on zosyn since 09/27/22 He was in ICU and remained intubated until 09/30/22 - weaned off pressors Because of worsening WBC he had repeat CT on 10/28 which showed multiple collections in the abdomen Meropenem was started and zosyn Dc 'today intervention radiology placed a drain on the rt upper quadrant collection and aspirated 2 more collections and sent all three for culture Pt has been having shallow breathing and lethargy since then and was transferred to ICU and has been intubated  I am asked to see the patient for infection   Past Medical History:  Diagnosis Date   Adenocarcinoma of colon ([place]) 09/08/2022   Cholelithiasis    Chronic gastritis    Colon polyps    Coronary artery calcification seen on CT scan    Diastolic dysfunction    a.) TTE 04/09/2022: EF >55%, triv TR, G1DD   Diverticulosis    Hypertension    Long term current use of anticoagulant    a.) apixaban   Nephrolithiasis    Paroxysmal A-fib ([place])    a.) CHA2DS2VASc = 3 (age, HTN, T2DM);  b.) s/p DCCV 07/09/2016 (100 J x1), 12/22/2018 (120 J x1), 08/18/2021  (120 J x1), 03/11/2022 (120 J x1), 03/31/2022 (120 J x1); c.) rate/rhythm maintained on oral flecanide + metoprolol succinate; chronically anticoagulated with apixaban   T2DM (type 2 diabetes mellitus) ([place])     Past Surgical History:  Procedure Laterality Date   CARDIOVERSION N/A 12/22/2018   Procedure: CARDIOVERSION (CATH LAB);  Surgeon: Corey Skains, MD;  Location: [facility] ORS;  Service: Cardiovascular;  Laterality: N/A;   CARDIOVERSION N/A 08/18/2021   Procedure: CARDIOVERSION;  Surgeon: Corey Skains, MD;  Location: [facility] OR[facility];  Service: Cardiovascular;  Laterality: N/A;   CARDIOVERSION N/A 03/11/2022   Procedure: CARDIOVERSION;  Surgeon: Corey Skains, MD;  Location: [facility] OR[facility];  Service: Cardiovascular;  Laterality: N/A;   CARDIOVERSION N/A 03/31/2022   Procedure: CARDIOVERSION;  Surgeon: Corey Skains, MD;  Location: [facility] OR[facility];  Service: Cardiovascular;  Laterality: N/A;   CHOLECYSTECTOMY N/A 09/24/2022   Procedure: LAPAROSCOPIC CHOLECYSTECTOMY;  Surgeon: Jules Husbands, MD;  Location: [facility] ORS;  Service: General;  Laterality: N/A;   COLONOSCOPY WITH PROPOFOL N/A 09/08/2022   Procedure: COLONOSCOPY WITH PROPOFOL;  Surgeon: Lucilla Lame, MD;  Location: [facility] ENDOSCOPY;  Service: Endoscopy;  Laterality: N/A;   ELECTROPHYSIOLOGIC STUDY N/A 07/09/2016   Procedure: CARDIOVERSION;  Surgeon: Dionisio David, MD;  Location: [facility] ORS;  Service: Cardiovascular;  Laterality: N/A;   LAPAROSCOPIC RIGHT COLECTOMY N/A 09/24/2022   Procedure: LAPAROSCOPIC RIGHT COLECTOMY, RNFA to assist;  Surgeon: Jules Husbands, MD;  Location: [facility] OR[facility];  Service: General;  Laterality: N/A;  LAPAROTOMY N/A 09/28/2022   Procedure: EXPLORATORY LAPAROTOMY WITH OSTOMY CREATION;  Surgeon: Jules Husbands, MD;  Location: [facility] OR[facility];  Service: General;  Laterality: N/A;   TEE WITHOUT CARDIOVERSION N/A 07/09/2016   Procedure: TRANSESOPHAGEAL ECHOCARDIOGRAM (TEE);  Surgeon: Dionisio David, MD;  Location: [facility] OR[facility];   Service: Cardiovascular;  Laterality: N/A;   web fingers repaired     as a child    Social History   Socioeconomic History   Marital status: Married    Spouse name: Not on file   Number of children: Not on file   Years of education: Not on file   Highest education level: Not on file  Occupational History   Not on file  Tobacco Use   Smoking status: Never    Passive exposure: Never   Smokeless tobacco: Never  Vaping Use   Vaping Use: Never used  Substance and Sexual Activity   Alcohol use: No   Drug use: No   Sexual activity: Not on file  Other Topics Concern   Not on file  Social History Narrative   Lives with wife, Nicolas Zuniga, with on pet, cat.   Social Determinants of Health   Financial Resource Strain: Not on file  Food Insecurity: No Food Insecurity (09/24/2022)   Hunger Vital Sign    Worried About Running Out of Food in the Last Year: Never true    Ran Out of Food in the Last Year: Never true  Transportation Needs: No Transportation Needs (09/24/2022)   PRAPARE - Hydrologist (Medical): No    Lack of Transportation (Non-Medical): No  Physical Activity: Not on file  Stress: Not on file  Social Connections: Not on file  Intimate Partner Violence: Not At Risk (09/24/2022)   Humiliation, Afraid, Rape, and Kick questionnaire    Fear of Current or Ex-Partner: No    Emotionally Abused: No    Physically Abused: No    Sexually Abused: No    Family History  Problem Relation Age of Onset   Varicose Veins Mother    Heart disease Mother    Diabetes Mother    COPD Father    No Known Allergies I? Current Facility-Administered Medications  Medication Dose Route Frequency Provider Last Rate Last Admin   0.9 %  sodium chloride infusion  250 mL Intravenous Continuous Bradly Bienenstock, NP       amiodarone (PACERONE) tablet 200 mg  200 mg Oral Daily Tristan Schroeder, PA-C   200 mg at 10/05/22 5027   Chlorhexidine Gluconate Cloth 2 % PADS 6  each  6 each Topical Daily Caroleen Hamman F, MD   6 each at 10/05/22 0923   dextrose 50 % solution 0-50 mL  0-50 mL Intravenous PRN Teressa Lower, NP       escitalopram (LEXAPRO) tablet 5 mg  5 mg Oral Daily Patrecia Pour, NP   5 mg at 10/05/22 7412   etomidate (AMIDATE) 2 MG/ML injection            etomidate (AMIDATE) injection 20 mg  20 mg Intravenous Once Bradly Bienenstock, NP       fentaNYL (SUBLIMAZE) 100 MCG/2ML injection            fentaNYL (SUBLIMAZE) injection 100 mcg  100 mcg Intravenous Once Bradly Bienenstock, NP       furosemide (LASIX) injection 40 mg  40 mg Intravenous BID Loletha Grayer, MD   40 mg at 10/05/22 0900  insulin aspart (novoLOG) injection 0-20 Units  0-20 Units Subcutaneous Q4H Dallie Piles, RPH   7 Units at 10/05/22 1724   insulin aspart (novoLOG) injection 5 Units  5 Units Subcutaneous Q4H Oswald Hillock, RPH   5 Units at 10/05/22 1724   insulin detemir (LEVEMIR) injection 15 Units  15 Units Subcutaneous BID Dallie Piles, RPH   15 Units at 10/05/22 1829   meropenem (MERREM) 1 g in sodium chloride 0.9 % 100 mL IVPB  1 g Intravenous Q8H Val Riles, MD   Stopped at 10/05/22 9371   metoprolol tartrate (LOPRESSOR) tablet 25 mg  25 mg Oral BID Tristan Schroeder, PA-C   25 mg at 10/05/22 6967   morphine (PF) 4 MG/ML injection 2 mg  2 mg Intravenous Q3H PRN Jules Husbands, MD   2 mg at 10/03/22 0035   norepinephrine (LEVOPHED) '4mg'$  in 244m (0.016 mg/mL) premix infusion  2-10 mcg/min Intravenous Titrated KDarel HongD, NP       ondansetron (ZOFRAN-ODT) disintegrating tablet 4 mg  4 mg Oral Q6H PRN Pabon, Diego F, MD       Or   ondansetron (ZOFRAN) injection 4 mg  4 mg Intravenous Q6H PRN Pabon, Diego F, MD       Oral care mouth rinse  15 mL Mouth Rinse PRN Pabon, Diego F, MD       oxyCODONE (Oxy IR/ROXICODONE) immediate release tablet 5-10 mg  5-10 mg Oral Q4H PRN Pabon, Diego F, MD   5 mg at 10/04/22 0843   pantoprazole (PROTONIX) injection 40 mg  40 mg  Intravenous QHS PCaroleen HammanF, MD   40 mg at 10/04/22 2052   prochlorperazine (COMPAZINE) tablet 10 mg  10 mg Oral Q6H PRN Pabon, DMarjory Lies MD       Or   prochlorperazine (COMPAZINE) injection 5-10 mg  5-10 mg Intravenous Q6H PRN Pabon, Diego F, MD       rocuronium bromide 10 mg/mL (PF) syringe  50 mg Intravenous Once KDarel HongD, NP       rocuronium bromide 100 MG/10ML SOSY            sodium chloride flush (NS) 0.9 % injection 10-40 mL  10-40 mL Intracatheter Q12H Pabon, Diego F, MD   10 mL at 10/05/22 0904   sodium chloride flush (NS) 0.9 % injection 10-40 mL  10-40 mL Intracatheter PRN Pabon, Diego F, MD       sodium chloride flush (NS) 0.9 % injection 5 mL  5 mL Intracatheter Q8H YAletta Edouard MD   5 mL at 10/05/22 1717   TPN ADULT (ION)   Intravenous Continuous TPN Coulter, CHoyle Sauer RPH         Abtx:  Anti-infectives (From admission, onward)    Start     Dose/Rate Route Frequency Ordered Stop   10/04/22 1400  meropenem (MERREM) 1 g in sodium chloride 0.9 % 100 mL IVPB        1 g 200 mL/hr over 30 Minutes Intravenous Every 8 hours 10/04/22 0921     09/27/22 1400  piperacillin-tazobactam (ZOSYN) IVPB 3.375 g  Status:  Discontinued        3.375 g 12.5 mL/hr over 240 Minutes Intravenous Every 8 hours 09/27/22 1214 10/04/22 0921   09/24/22 2200  cefoTEtan (CEFOTAN) 2 g in sodium chloride 0.9 % 100 mL IVPB        2 g 200 mL/hr over 30 Minutes Intravenous Every 12 hours 09/24/22 1717  09/25/22 0954   09/24/22 1530  metroNIDAZOLE (FLAGYL) IVPB 500 mg        500 mg 100 mL/hr over 60 Minutes Intravenous  Once 09/24/22 1515 09/24/22 1525   09/24/22 0600  ceFAZolin (ANCEF) IVPB 3g/100 mL premix  Status:  Discontinued        3 g 200 mL/hr over 30 Minutes Intravenous On call to O.R. 09/24/22 0112 09/24/22 1717       REVIEW OF SYSTEMS:  NA Objective:  VITALS:  BP (!) 147/87   Pulse (!) 102   Temp 99 F (37.2 C)   Resp (!) 30   Ht '6\' 2"'$  (1.88 m)   Wt 67.1 kg   SpO2 97%    BMI 18.99 kg/m  LDA Rt PICc foley PHYSICAL EXAM:  General: intubated Head: Normocephalic, without obvious abnormality, atraumatic. Eyes: Conjunctivae clear, anicteric sclerae. Pupils are equal ENTcannot be examined Neck: Supple, symmetrical, no adenopathy, thyroid: non tender no carotid bruit and no JVD. Lungs: b/l air entry- crepts b/l   Heart: irregular RVR Abdomen: Soft, loop ileostomy Rt upper quadraint drain Left JP drain Extremities: atraumatic, no cyanosis. No edema. No clubbing Skin: No rashes or lesions. Or bruising Lymph: Cervical, supraclavicular normal. Neurologic: cannot be assessed Pertinent Labs Getting blood transfuion Lab Results CBC    Component Value Date/Time   WBC 31.9 (H) 10/05/2022 0440   RBC 2.99 (L) 10/05/2022 0440   HGB 7.2 (L) 10/05/2022 0440   HGB 16.8 04/05/2014 1427   HCT 23.3 (L) 10/05/2022 0440   HCT 50.9 04/05/2014 1427   PLT 405 (H) 10/05/2022 0440   PLT 190 04/05/2014 1427   MCV 77.9 (L) 10/05/2022 0440   MCV 93 04/05/2014 1427   MCH 24.1 (L) 10/05/2022 0440   MCHC 30.9 10/05/2022 0440   RDW 16.2 (H) 10/05/2022 0440   RDW 12.9 04/05/2014 1427   LYMPHSABS 2.4 10/05/2022 0440   LYMPHSABS 4.8 (H) 09/27/2012 0804   MONOABS 2.7 (H) 10/05/2022 0440   MONOABS 0.9 09/27/2012 0804   EOSABS 0.2 10/05/2022 0440   EOSABS 0.3 09/27/2012 0804   BASOSABS 0.1 10/05/2022 0440   BASOSABS 0.1 09/27/2012 0804       Latest Ref Rng & Units 10/05/2022    4:40 AM 10/04/2022    5:38 AM 10/03/2022    4:26 AM  CMP  Glucose 70 - 99 mg/dL 248  313  283   BUN 8 - 23 mg/dL 31  33  38   Creatinine 0.61 - 1.24 mg/dL 1.32  1.67  2.23   Sodium 135 - 145 mmol/L 136  134  136   Potassium 3.5 - 5.1 mmol/L 4.4  4.1  4.2   Chloride 98 - 111 mmol/L 106  102  105   CO2 22 - 32 mmol/L '25  23  25   '$ Calcium 8.9 - 10.3 mg/dL 8.4  8.4  8.4       Microbiology: Recent Results (from the past 240 hour(s))  MRSA Next Gen by PCR, Nasal     Status: None    Collection Time: 09/27/22  1:53 AM   Specimen: Nasal Mucosa; Nasal Swab  Result Value Ref Range Status   MRSA by PCR Next Gen NOT DETECTED NOT DETECTED Final    Comment: (NOTE) The GeneXpert MRSA Assay (FDA approved for NASAL specimens only), is one component of a comprehensive MRSA colonization surveillance program. It is not intended to diagnose MRSA infection nor to guide or monitor treatment for MRSA infections. Test  performance is not FDA approved in patients less than 2 years old. Performed at [facility], [address] [place], [place] [address]   Culture, Respiratory w Gram Stain     Status: None   Collection Time: 09/29/22  8:23 AM   Specimen: Tracheal Aspirate; Respiratory  Result Value Ref Range Status   Specimen Description   Final    TRACHEAL ASPIRATE Performed at [facility], [address], [place], [place] [address]    Special Requests   Final    NONE Performed at [facility], [address] [place], [place] [address]    Gram Stain   Final    RARE WBC PRESENT, PREDOMINANTLY PMN RARE BUDDING YEAST SEEN Performed at [facility], [place] [address] [place], [place] [address]    Culture   Final    MODERATE SERRATIA MARCESCENS FEW CANDIDA ALBICANS    Report Status 10/01/2022 FINAL  Final   Organism ID, Bacteria SERRATIA MARCESCENS  Final      Susceptibility   Serratia marcescens - MIC*    CEFAZOLIN >=64 RESISTANT Resistant     CEFEPIME <=0.12 SENSITIVE Sensitive     CEFTAZIDIME <=1 SENSITIVE Sensitive     CEFTRIAXONE 1 SENSITIVE Sensitive     CIPROFLOXACIN <=0.25 SENSITIVE Sensitive     GENTAMICIN <=1 SENSITIVE Sensitive     TRIMETH/SULFA <=20 SENSITIVE Sensitive     * MODERATE SERRATIA MARCESCENS    IMAGING RESULTS:  I have personally reviewed the films patchy infiltrate left midlung and rt lung base  Impression/Recommendation ? Adenocarcinoma of the rt colon- s/p hemicolectomy Intraabdominal abscesses  with  worsening leucocytosis Exp lap on 09/28/22- no leak identified- but purulent fluid Today rt upper quadrant and 2 other collections aspirated- sent for culture Continue meropenem Add micafungin until cultures are back  Acute hypoxic resp failure with hypercapnia - r/o medication effect/ aspiration Pt is intubated  Acute encephalopathy- likely due to the above, co2 narcosis  AFIB with RVR  Anemia  AKI- improved   ? ___________________________________________________ Discussed with  requesting provider Note:  This document was prepared using Dragon voice recognition software and may include unintentional dictation errors.

## 2022-10-05 NOTE — Progress Notes (Signed)
Progress Note   Patient: Nicolas Zuniga:[id number] DOB: 17-Jul-1956 DOA: 09/24/2022     11 DOS: the patient was seen and examined on 10/05/2022   Brief hospital course: Nicolas Zuniga is a 66 y.o. male with hx of recently diagnosed adenocarcinoma of the colon who is currently admitted status post right colectomy, diabetes, hypertension, and A-fib status post multiple ablations currently on metoprolol and flecainide.  09/24/2022 - Underwent a laparoscopic right colectomy that was converted to open right colectomy along with a cholecystectomy - no significant complications and EBL was minimal at 80 cc.   10/20: Seen early morning by Dr. Dahlia Byes (general surgery) and was doing well.  Starting around 6 PM he began to have hypotension 70/41 and the hospitalist service was consulted. Pt was given total of 25 g of albumin, 2 L of normal saline, and started on glucagon gtt concern for beta blocker toxicity. His metoprolol and flecainide were held. BP improved. Mild AKI on labs Cr 1.93 down form admission 1.23, likely hypovolemia. D/c glucagon. Strict I&O. Continue hold beta blocker and flecainide, may reduce dose metoprolol on discharge.  10/21: BP soft but improved 115/53, 117/64. Cr improved 1.66. Hgb stable.  Rapid response overnight, A-fib RVR.  Transferred to stepdown, amiodarone infusion, minimal response, diltiazem infusion.  Dr. Humphrey Rolls, cardiology, aware. Dr Dahlia Byes ordering CT chest/abdomen/pelvis, starting heparin drip. CT no PE, (+) Small bowel leak, certainly no contrast extravasation and seems contained --> plan repeat CT, hold off on surgical intervention unless emergent, starting abx.  10/23: Patient went for repeat CT noting increased extravasation concerning for anastomotic leak.  Patient taken back to the OR and well no leak found, underwent bowel follow-through and washout.  Postop, patient in ICU and still intubated.  ABG notes severe respiratory acidosis.  Note that should patient remain on  ventilator, will have critical care assumed medical management in conjunction with surgery. 10/24 for worsening acute renal failure, nephrology consulted.  Patient with bradycardia overnight and Cardizem and Precedex discontinued. 10/25 extubated, off vasopressors, weaned off insulin drip to subcutaneous insulin 10/28.  CT scan showing.  Decreasing intraperitoneal free gas, free fluid on the left liver similar.  Multiple fluid collections in the mesentery. 10/30 interventional radiology performed a IR drainage procedure.  Patient ordered a unit of packed red blood cells on a hemoglobin of 7.2.  I started Lasix for 2 doses.         Assessment and Plan: * Adenocarcinoma of colon (Nicolas Zuniga) Right colon cancer status post laparoscopic right colectomy and cholecystectomy on 09/24/2022. Intra-abdominal infection status post exploratory laparotomy with abdominal washout and diverting loop ileostomy on 10/23 10/29 Meropenem started with persistent leukocytosis and Zosyn discontinued. 10/30 interventional radiology did a CT aspiration of left anterior abdominal fluid collection and placed a drain for the  perihepatic fluid collection.   Septic shock ([place]) Septic shock has resolved.  Off pressors at this point.  Still on meropenem.  Paroxysmal A-fib ([facility]) Currently on amiodarone and metoprolol  AKI (acute kidney injury) ([place]) Creatinine peaked at 4.61 on 09/30/2022 improved down to 1.32 on 10/05/2022.  Generalized anxiety disorder The patient did not want to take gabapentin anymore.  I advised to continue taking the Lexapro to prevent anxiety  Microcytic anemia Surgical team ordered a unit of blood.  Weakness Continue working with physical therapy  Acute hypoxic respiratory failure [facility]) Patient required intubation during the hospital course.  Currently on 3 L of oxygen pulse ox 96%.  Hyponatremia Last sodium normal range  Diabetes ([place]) Type 2 diabetes mellitus on long-acting insulin  with being on TPN.  Last hemoglobin A1c 9.8.        Subjective: Patient not feeling well.  Family concerned about medications for anxiety.  Pain medications make him feel awful.  Patient not doing well.  Physical Exam: Vitals:   10/05/22 1417 10/05/22 1450 10/05/22 1500 10/05/22 1515  BP: 136/64 129/67 126/70 (!) 123/59  Pulse: (!) 113 (!) 103 (!) 104 94  Resp: (!) 26 (!) 35 (!) 34 (!) 36  Temp:      TempSrc:      SpO2: 96% 93% 96% 96%  Weight:      Height:       Physical Exam HENT:     Head: Normocephalic.     Mouth/Throat:     Pharynx: No oropharyngeal exudate.  Eyes:     General: Lids are normal.     Conjunctiva/sclera: Conjunctivae normal.  Cardiovascular:     Rate and Rhythm: Normal rate and regular rhythm.     Heart sounds: Normal heart sounds, S1 normal and S2 normal.  Pulmonary:     Breath sounds: Examination of the right-lower field reveals decreased breath sounds. Examination of the left-lower field reveals decreased breath sounds. Decreased breath sounds present. No wheezing, rhonchi or rales.  Abdominal:     Palpations: Abdomen is soft.     Tenderness: There is abdominal tenderness.  Musculoskeletal:     Right lower leg: Swelling present.     Left lower leg: Swelling present.  Skin:    General: Skin is warm.     Findings: No rash.  Neurological:     Mental Status: He is alert and oriented to person, place, and time.     Data Reviewed: Creatinine 1.32, hemoglobin 7.2, white blood cell count 31.9, platelet count 405  Family Communication: I spoke with the patient's wife at the bedside  Disposition: Status is: Inpatient Remains inpatient appropriate because: Still very sick.  White blood cell count elevated.  Surgical team ordered for a unit of blood.  Still on TPN.  Planned Discharge Destination: To be determined    Time spent: 30 minutes  Author: Loletha Grayer, MD 10/05/2022 3:31 PM  For on call review [email or web address].

## 2022-10-05 NOTE — Assessment & Plan Note (Signed)
The patient did not want to take gabapentin anymore.  I advised to continue taking the Lexapro to prevent anxiety

## 2022-10-06 ENCOUNTER — Inpatient Hospital Stay: Payer: Medicare Other

## 2022-10-06 DIAGNOSIS — K651 Peritoneal abscess: Secondary | ICD-10-CM

## 2022-10-06 DIAGNOSIS — Z9049 Acquired absence of other specified parts of digestive tract: Secondary | ICD-10-CM | POA: Diagnosis not present

## 2022-10-06 DIAGNOSIS — J9601 Acute respiratory failure with hypoxia: Secondary | ICD-10-CM | POA: Diagnosis not present

## 2022-10-06 DIAGNOSIS — C189 Malignant neoplasm of colon, unspecified: Secondary | ICD-10-CM | POA: Diagnosis not present

## 2022-10-06 LAB — COMPREHENSIVE METABOLIC PANEL
ALT: 11 U/L (ref 0–44)
AST: 20 U/L (ref 15–41)
Albumin: 2.2 g/dL — ABNORMAL LOW (ref 3.5–5.0)
Alkaline Phosphatase: 64 U/L (ref 38–126)
Anion gap: 5 (ref 5–15)
BUN: 35 mg/dL — ABNORMAL HIGH (ref 8–23)
CO2: 26 mmol/L (ref 22–32)
Calcium: 8.7 mg/dL — ABNORMAL LOW (ref 8.9–10.3)
Chloride: 107 mmol/L (ref 98–111)
Creatinine, Ser: 1.45 mg/dL — ABNORMAL HIGH (ref 0.61–1.24)
GFR, Estimated: 53 mL/min — ABNORMAL LOW (ref >60)
Glucose, Bld: 156 mg/dL — ABNORMAL HIGH (ref 70–99)
Potassium: 4.9 mmol/L (ref 3.5–5.1)
Sodium: 138 mmol/L (ref 135–145)
Total Bilirubin: 0.5 mg/dL (ref 0.3–1.2)
Total Protein: 6.7 g/dL (ref 6.5–8.1)

## 2022-10-06 LAB — TYPE AND SCREEN
ABO/RH(D): O NEG
Antibody Screen: NEGATIVE
Unit division: 0
Unit division: 0
Unit division: 0
Unit division: 0
Unit division: 0

## 2022-10-06 LAB — LACTIC ACID, PLASMA
Lactic Acid, Venous: 1.3 mmol/L (ref 0.5–1.9)
Lactic Acid, Venous: 1.4 mmol/L (ref 0.5–1.9)
Lactic Acid, Venous: 2 mmol/L (ref 0.5–1.9)

## 2022-10-06 LAB — CBC WITH DIFFERENTIAL/PLATELET
Abs Immature Granulocytes: 1.39 10*3/uL — ABNORMAL HIGH (ref 0.00–0.07)
Basophils Absolute: 0.2 10*3/uL — ABNORMAL HIGH (ref 0.0–0.1)
Basophils Relative: 1 %
Eosinophils Absolute: 0.1 10*3/uL (ref 0.0–0.5)
Eosinophils Relative: 0 %
HCT: 27.3 % — ABNORMAL LOW (ref 39.0–52.0)
Hemoglobin: 8.2 g/dL — ABNORMAL LOW (ref 13.0–17.0)
Immature Granulocytes: 5 %
Lymphocytes Relative: 11 %
Lymphs Abs: 3.5 10*3/uL (ref 0.7–4.0)
MCH: 24.3 pg — ABNORMAL LOW (ref 26.0–34.0)
MCHC: 30 g/dL (ref 30.0–36.0)
MCV: 81 fL (ref 80.0–100.0)
Monocytes Absolute: 3.2 10*3/uL — ABNORMAL HIGH (ref 0.1–1.0)
Monocytes Relative: 10 %
Neutro Abs: 22.8 10*3/uL — ABNORMAL HIGH (ref 1.7–7.7)
Neutrophils Relative %: 73 %
Platelets: 499 10*3/uL — ABNORMAL HIGH (ref 150–400)
RBC: 3.37 MIL/uL — ABNORMAL LOW (ref 4.22–5.81)
RDW: 16.6 % — ABNORMAL HIGH (ref 11.5–15.5)
Smear Review: NORMAL
WBC: 31.1 10*3/uL — ABNORMAL HIGH (ref 4.0–10.5)
nRBC: 2 % — ABNORMAL HIGH (ref 0.0–0.2)

## 2022-10-06 LAB — BASIC METABOLIC PANEL
Anion gap: 6 (ref 5–15)
BUN: 35 mg/dL — ABNORMAL HIGH (ref 8–23)
CO2: 26 mmol/L (ref 22–32)
Calcium: 8.7 mg/dL — ABNORMAL LOW (ref 8.9–10.3)
Chloride: 107 mmol/L (ref 98–111)
Creatinine, Ser: 1.52 mg/dL — ABNORMAL HIGH (ref 0.61–1.24)
GFR, Estimated: 50 mL/min — ABNORMAL LOW (ref >60)
Glucose, Bld: 71 mg/dL (ref 70–99)
Potassium: 4.8 mmol/L (ref 3.5–5.1)
Sodium: 139 mmol/L (ref 135–145)

## 2022-10-06 LAB — BPAM RBC
Blood Product Expiration Date: 202310252359
Blood Product Expiration Date: 202310262359
Blood Product Expiration Date: 202311232359
Blood Product Expiration Date: 202311262359
Blood Product Expiration Date: 202312042359
ISSUE DATE / TIME: 202310241852
ISSUE DATE / TIME: 202310261652
ISSUE DATE / TIME: 202310300919
ISSUE DATE / TIME: 202310301646
Unit Type and Rh: 5100
Unit Type and Rh: 5100
Unit Type and Rh: 5100
Unit Type and Rh: 9500
Unit Type and Rh: 9500

## 2022-10-06 LAB — HEMOGLOBIN AND HEMATOCRIT, BLOOD
HCT: 18.8 % — ABNORMAL LOW (ref 39.0–52.0)
HCT: 29.1 % — ABNORMAL LOW (ref 39.0–52.0)
HCT: 25.7 % — ABNORMAL LOW (ref 39.0–52.0)
Hemoglobin: 6.1 g/dL — ABNORMAL LOW (ref 13.0–17.0)
Hemoglobin: 8.8 g/dL — ABNORMAL LOW (ref 13.0–17.0)
Hemoglobin: 8 g/dL — ABNORMAL LOW (ref 13.0–17.0)

## 2022-10-06 LAB — GLUCOSE, CAPILLARY
Glucose-Capillary: 138 mg/dL — ABNORMAL HIGH (ref 70–99)
Glucose-Capillary: 179 mg/dL — ABNORMAL HIGH (ref 70–99)
Glucose-Capillary: 184 mg/dL — ABNORMAL HIGH (ref 70–99)
Glucose-Capillary: 63 mg/dL — ABNORMAL LOW (ref 70–99)
Glucose-Capillary: 77 mg/dL (ref 70–99)
Glucose-Capillary: 96 mg/dL (ref 70–99)
Glucose-Capillary: 99 mg/dL (ref 70–99)
Glucose-Capillary: 99 mg/dL (ref 70–99)

## 2022-10-06 LAB — MAGNESIUM: Magnesium: 1.8 mg/dL (ref 1.7–2.4)

## 2022-10-06 LAB — TROPONIN I (HIGH SENSITIVITY): Troponin I (High Sensitivity): 25 ng/L — ABNORMAL HIGH (ref <18)

## 2022-10-06 LAB — TRIGLYCERIDES: Triglycerides: 149 mg/dL (ref <150)

## 2022-10-06 LAB — PHOSPHORUS: Phosphorus: 4 mg/dL (ref 2.5–4.6)

## 2022-10-06 MED ORDER — PROSOURCE TF20 ENFIT COMPATIBL EN LIQD
60.0000 mL | Freq: Every day | ENTERAL | Status: DC
  Administered 2022-10-07 – 2022-10-09 (×3): 60 mL

## 2022-10-06 MED ORDER — FREE WATER
30.0000 mL | Status: DC
  Administered 2022-10-06 – 2022-10-09 (×18): 30 mL

## 2022-10-06 MED ORDER — OSMOLITE 1.5 CAL PO LIQD
1000.0000 mL | ORAL | Status: DC
  Administered 2022-10-06 – 2022-10-08 (×4): 1000 mL

## 2022-10-06 MED ORDER — DEXTROSE 50 % IV SOLN
25.0000 mL | Freq: Once | INTRAVENOUS | Status: AC
  Administered 2022-10-06: 25 mL via INTRAVENOUS

## 2022-10-06 MED ORDER — SODIUM CHLORIDE 0.9% IV SOLUTION: Freq: Once | INTRAVENOUS | Status: DC

## 2022-10-06 MED ORDER — AMIODARONE HCL 200 MG PO TABS
200.0000 mg | ORAL_TABLET | Freq: Every day | ORAL | Status: DC
  Administered 2022-10-06 – 2022-10-11 (×6): 200 mg
  Filled 2022-10-06 (×6): qty 1

## 2022-10-06 MED ORDER — OXYCODONE HCL 5 MG PO TABS: 5.0000 mg | ORAL_TABLET | ORAL | Status: DC | PRN

## 2022-10-06 NOTE — Progress Notes (Signed)
Date of Admission:  09/24/2022      ID: Nicolas Zuniga is a 66 y.o. male Principal Problem:   Adenocarcinoma of colon ([place]) Active Problems:   Diabetes ([place])   Essential hypertension   Paroxysmal A-fib (HCC)   S/P right colectomy   S/P partial resection of colon   Hyponatremia   AKI (acute kidney injury) ([place])   Postoperative intra-abdominal abscess   Persistent atrial fibrillation (HCC)   Septic shock (HCC)   Diabetic ketoacidosis without coma associated with type 2 diabetes mellitus (HCC)   Generalized anxiety disorder   Acute hypoxic respiratory failure ([place])   Weakness   Microcytic anemia  Nicolas Zuniga is a 66 y.o. male with a history of HTN, Paroxysmal afib on eliquis, DM, adeno carcinoma of colon Was admitted for elective rt hemicolectomy on 09/24/22. The attempted lap colectomy was converted to laparotomy. HE also underwent cholecystectomy for chronic cholecystitis He started having increasing  cr, wbc and repeat cts were done' He was taken back for surgery on 09/28/22 and underwent exp laparotomy, abdominal washout, blake drain placement and diverting loop ileostomy . There was no leak identified. Midline laparotomy was allowed to heal by secondary intention Pt had a foley 09/28/22 and PICC placed on 09/27/22 Started on zosyn since 09/27/22 He was in ICU and remained intubated until 09/30/22 - weaned off pressors Because of worsening WBC he had repeat CT on 10/28 which showed multiple collections in the abdomen Meropenem was started and zosyn Dc 'today intervention radiology placed a drain on the rt upper quadrant collection and aspirated 2 more collections and sent all three for culture Pt has been having shallow breathing and lethargy since then and was transferred to ICU and was intubated . Has been extubated today  Subjective: Pt extubated today  Medications:   sodium chloride   Intravenous Once   amiodarone  200 mg Per Tube Daily   Chlorhexidine Gluconate  Cloth  6 each Topical Daily   docusate  100 mg Per Tube BID   [START ON 10/07/2022] feeding supplement (PROSource TF20)  60 mL Per Tube Daily   fentaNYL (SUBLIMAZE) injection  100 mcg Intravenous Once   free water  30 mL Per Tube Q4H   insulin aspart  0-20 Units Subcutaneous Q4H   insulin aspart  5 Units Subcutaneous Q4H   insulin detemir  15 Units Subcutaneous BID   pantoprazole (PROTONIX) IV  40 mg Intravenous QHS   sodium chloride flush  10-40 mL Intracatheter Q12H   sodium chloride flush  5 mL Intracatheter Q8H    Objective: Vital signs in last 24 hours: Temp:  [96.6 F (35.9 C)-99.5 F (37.5 C)] 99 F (37.2 C) (10/31 1200) Pulse Rate:  [66-119] 90 (10/31 1500) Resp:  [15-36] 28 (10/31 1500) BP: (65-171)/(45-111) 141/62 (10/31 1500) SpO2:  [93 %-100 %] 97 % (10/31 1500) FiO2 (%):  [35 %-60 %] 35 % (10/31 0949) Weight:  [130.1 kg] 130.1 kg (10/31 0500)  LDA Foley PICC  PHYSICAL EXAM:  General: somnolent, responds to calling his name.  Lungs: b/l air entry Heart: s1s2 Abdomen: surgical dressing over the abdominal surgical wound- rt upper quadrant drain 'ileostomy JP drain left Extremities: atraumatic, no cyanosis. No edema. No clubbing Skin: No rashes or lesions. Or bruising Lymph: Cervical, supraclavicular normal. Neurologic: Grossly non-focal  Lab Results Recent Labs    10/05/22 1842 10/05/22 2207 10/06/22 0110 10/06/22 0457 10/06/22 1135  WBC 29.4*  --   --  31.1*  --  HGB 7.4*   < > 8.8* 8.2* 8.0*  HCT 23.0*   < > 29.1* 27.3* 25.7*  NA 127*   < > 138 139  --   K 5.3*   < > 4.9 4.8  --   CL 99   < > 107 107  --   CO2 26   < > 26 26  --   BUN 31*   < > 35* 35*  --   CREATININE 1.24   < > 1.45* 1.52*  --    < > = values in this interval not displayed.   Liver Panel Recent Labs    10/05/22 2207 10/06/22 0110  PROT 6.2* 6.7  ALBUMIN 1.7* 2.2*  AST 21 20  ALT 11 11  ALKPHOS 63 64  BILITOT 0.5 0.5   Sedimentation Rate No results for input(s):  "ESRSEDRATE" in the last 72 hours. C-Reactive Protein No results for input(s): "CRP" in the last 72 hours.  Microbiology: 10/05/22 Abdominal fluid X3 pending  Studies/Results: DG Abd 1 View  Result Date: 10/06/2022 CLINICAL DATA:  Nasogastric tube placement. EXAM: ABDOMEN - 1 VIEW COMPARISON:  10/05/2022 FINDINGS: The previously demonstrated nasogastric tube has been replaced with a feeding tube with its tip in the proximal stomach. Normal bowel gas pattern with small amount of contrast in the stomach and bowel. Lumbar and thoracic spine degenerative changes. Mildly progressive left basilar atelectasis. IMPRESSION: 1. Feeding tube tip in the proximal stomach. 2. Mildly progressive left basilar atelectasis. Electronically Signed   By: Claudie Revering M.D.   On: 10/06/2022 12:54   DG Abd 1 View  Result Date: 10/05/2022 CLINICAL DATA:  Orogastric tube placement EXAM: ABDOMEN - 1 VIEW COMPARISON:  None Available. FINDINGS: Orogastric tube tip overlies expected proximal to mid body of the stomach. Central venous catheter tip noted within the superior vena cava. Pigtail drainage catheter overlies the right hepatic lobe. Surgical clips are seen within the right mid abdomen. Visualized abdominal gas pattern is nonspecific due to a paucity of gas within the upper abdomen. No gross free intraperitoneal gas. IMPRESSION: 1. Orogastric tube tip within the proximal to mid body of the stomach. Electronically Signed   By: Fidela Salisbury M.D.   On: 10/05/2022 20:34   DG Chest 1 View  Result Date: 10/05/2022 CLINICAL DATA:  Intubation EXAM: CHEST  1 VIEW COMPARISON:  Chest x-ray 09/28/2022 FINDINGS: Endotracheal tube tip is 3 cm above the carina. Right upper extremity PICC terminates over the distal SVC. Enteric tube extends below the diaphragm, distal tip is not included on the image. Lung volumes are low. There are patchy opacities in the left mid lung and right lung base similar to prior. No pleural effusion or  pneumothorax. The cardiomediastinal silhouette is stable, the heart is enlarged. The osseous structures are unchanged. IMPRESSION: 1. Endotracheal tube tip is 3 cm above the carina. 2. Stable patchy opacities in the left mid lung and right lung base. Electronically Signed   By: Ronney Asters M.D.   On: 10/05/2022 18:51   CT GUIDED PERITONEAL/RETROPERITONEAL FLUID DRAIN BY PERC CATH  Result Date: 10/05/2022 INDICATION: Intra-abdominal fluid collections after prior right hemicolectomy and cholecystectomy for colon carcinoma and cholelithiasis. Persistent significant leukocytosis. EXAM: 1. CT-GUIDED ASPIRATION OF LEFT ANTERIOR PERITONEAL FLUID COLLECTION 2. CT-GUIDED ASPIRATION OF LEFT POSTEROLATERAL PERITONEAL FLUID COLLECTION 3. CT-GUIDED CATHETER DRAINAGE OF RIGHT PERIHEPATIC PERITONEAL FLUID COLLECTION 4. MEDICATIONS: None ANESTHESIA/SEDATION: Moderate (conscious) sedation was employed during this procedure. A total of Versed 2.0 mg and  Fentanyl 100 mcg was administered intravenously by the radiology nurse. Total intra-service moderate Sedation Time: 52 minutes. The patient's level of consciousness and vital signs were monitored continuously by radiology nursing throughout the procedure under my direct supervision. COMPLICATIONS: None immediate. PROCEDURE: Informed written consent was obtained from the patient after a thorough discussion of the procedural risks, benefits and alternatives. All questions were addressed. Maximal Sterile Barrier Technique was utilized including caps, mask, sterile gowns, sterile gloves, sterile drape, hand hygiene and skin antiseptic. A timeout was performed prior to the initiation of the procedure. CT of the abdomen was performed in a supine position. Localizing scans were performed with grids and sites marked on the skin in the upper right abdomen and mid to lower left abdomen. From a left lateral approach, an 18 gauge trocar needle was advanced into an anterior left-sided  peritoneal fluid collection. After confirming needle tip position, aspiration was performed via the trocar needle utilizing syringes. A sample was sent for culture analysis. The needle was removed after additional CT was performed. A second 18 gauge trocar needle was then advanced from a left-sided approach into a left posterolateral peritoneal fluid collection. Aspiration was performed through the trocar needle. A fluid sample was sent for culture analysis. The needle was removed after additional CT imaging. An 18 gauge trocar needle was advanced in the right upper abdomen into the lateral perihepatic space. After confirming needle tip position and aspiration of fluid, a guidewire was advanced into the fluid collection. The percutaneous tract was dilated over the guidewire and a 10 French percutaneous drainage catheter advanced into the lateral perihepatic space. Catheter position was confirmed by CT. A fluid sample was withdrawn and sent for culture analysis. The drainage catheter was attached to a suction bulb and secured at the skin with a Prolene retention suture and adhesive device. FINDINGS: Aspiration of the elongated left anterior fluid collection immediately deep to the anterior abdominal wall yielded thin, clear yellow fluid. A total volume of 90 mL of fluid was able to be aspirated from this region resulting in complete decompression of the collection by CT after aspiration. Aspiration of the teardrop shaped fluid collection along the lateral paracolic region of the peritoneal cavity yielded darker yellow fluid which was slightly turbid, especially towards the end of aspiration. Aspiration yielded 50 mL of fluid resulting in near complete decompression of the collection by CT after aspiration. Aspiration of the right lateral perihepatic fluid collection yielded thin, clear yellow fluid. Given that this is the largest single area fluid present in the peritoneal cavity currently, it was elected to place a  drainage catheter in this fluid collection. IMPRESSION: 1. CT-guided needle aspiration of left anterior peritoneal fluid collection yielded 90 mL of thin, clear yellow fluid resulting in complete decompression. A fluid sample from this collection was sent for culture analysis. 2. CT-guided needle aspiration left posterolateral paracolic fluid collection yielded 50 mL of slightly turbid yellow fluid resulting in near complete decompression. A fluid sample was sent for culture analysis. 3. CT-guided percutaneous catheter drainage was performed of the perihepatic fluid collection from a right lateral approach yielding thin, clear yellow fluid. A 10 French drainage catheter was placed and attached to suction bulb drainage. Electronically Signed   By: Aletta Edouard M.D.   On: 10/05/2022 16:22   CT GUIDED NEEDLE PLACEMENT  Result Date: 10/05/2022 INDICATION: Intra-abdominal fluid collections after prior right hemicolectomy and cholecystectomy for colon carcinoma and cholelithiasis. Persistent significant leukocytosis. EXAM: 1. CT-GUIDED ASPIRATION OF LEFT ANTERIOR  PERITONEAL FLUID COLLECTION 2. CT-GUIDED ASPIRATION OF LEFT POSTEROLATERAL PERITONEAL FLUID COLLECTION 3. CT-GUIDED CATHETER DRAINAGE OF RIGHT PERIHEPATIC PERITONEAL FLUID COLLECTION 4. MEDICATIONS: None ANESTHESIA/SEDATION: Moderate (conscious) sedation was employed during this procedure. A total of Versed 2.0 mg and Fentanyl 100 mcg was administered intravenously by the radiology nurse. Total intra-service moderate Sedation Time: 52 minutes. The patient's level of consciousness and vital signs were monitored continuously by radiology nursing throughout the procedure under my direct supervision. COMPLICATIONS: None immediate. PROCEDURE: Informed written consent was obtained from the patient after a thorough discussion of the procedural risks, benefits and alternatives. All questions were addressed. Maximal Sterile Barrier Technique was utilized including  caps, mask, sterile gowns, sterile gloves, sterile drape, hand hygiene and skin antiseptic. A timeout was performed prior to the initiation of the procedure. CT of the abdomen was performed in a supine position. Localizing scans were performed with grids and sites marked on the skin in the upper right abdomen and mid to lower left abdomen. From a left lateral approach, an 18 gauge trocar needle was advanced into an anterior left-sided peritoneal fluid collection. After confirming needle tip position, aspiration was performed via the trocar needle utilizing syringes. A sample was sent for culture analysis. The needle was removed after additional CT was performed. A second 18 gauge trocar needle was then advanced from a left-sided approach into a left posterolateral peritoneal fluid collection. Aspiration was performed through the trocar needle. A fluid sample was sent for culture analysis. The needle was removed after additional CT imaging. An 18 gauge trocar needle was advanced in the right upper abdomen into the lateral perihepatic space. After confirming needle tip position and aspiration of fluid, a guidewire was advanced into the fluid collection. The percutaneous tract was dilated over the guidewire and a 10 French percutaneous drainage catheter advanced into the lateral perihepatic space. Catheter position was confirmed by CT. A fluid sample was withdrawn and sent for culture analysis. The drainage catheter was attached to a suction bulb and secured at the skin with a Prolene retention suture and adhesive device. FINDINGS: Aspiration of the elongated left anterior fluid collection immediately deep to the anterior abdominal wall yielded thin, clear yellow fluid. A total volume of 90 mL of fluid was able to be aspirated from this region resulting in complete decompression of the collection by CT after aspiration. Aspiration of the teardrop shaped fluid collection along the lateral paracolic region of the  peritoneal cavity yielded darker yellow fluid which was slightly turbid, especially towards the end of aspiration. Aspiration yielded 50 mL of fluid resulting in near complete decompression of the collection by CT after aspiration. Aspiration of the right lateral perihepatic fluid collection yielded thin, clear yellow fluid. Given that this is the largest single area fluid present in the peritoneal cavity currently, it was elected to place a drainage catheter in this fluid collection. IMPRESSION: 1. CT-guided needle aspiration of left anterior peritoneal fluid collection yielded 90 mL of thin, clear yellow fluid resulting in complete decompression. A fluid sample from this collection was sent for culture analysis. 2. CT-guided needle aspiration left posterolateral paracolic fluid collection yielded 50 mL of slightly turbid yellow fluid resulting in near complete decompression. A fluid sample was sent for culture analysis. 3. CT-guided percutaneous catheter drainage was performed of the perihepatic fluid collection from a right lateral approach yielding thin, clear yellow fluid. A 10 French drainage catheter was placed and attached to suction bulb drainage. Electronically Signed   By: Aletta Edouard  M.D.   On: 10/05/2022 16:22   CT GUIDED NEEDLE PLACEMENT  Result Date: 10/05/2022 INDICATION: Intra-abdominal fluid collections after prior right hemicolectomy and cholecystectomy for colon carcinoma and cholelithiasis. Persistent significant leukocytosis. EXAM: 1. CT-GUIDED ASPIRATION OF LEFT ANTERIOR PERITONEAL FLUID COLLECTION 2. CT-GUIDED ASPIRATION OF LEFT POSTEROLATERAL PERITONEAL FLUID COLLECTION 3. CT-GUIDED CATHETER DRAINAGE OF RIGHT PERIHEPATIC PERITONEAL FLUID COLLECTION 4. MEDICATIONS: None ANESTHESIA/SEDATION: Moderate (conscious) sedation was employed during this procedure. A total of Versed 2.0 mg and Fentanyl 100 mcg was administered intravenously by the radiology nurse. Total intra-service moderate  Sedation Time: 52 minutes. The patient's level of consciousness and vital signs were monitored continuously by radiology nursing throughout the procedure under my direct supervision. COMPLICATIONS: None immediate. PROCEDURE: Informed written consent was obtained from the patient after a thorough discussion of the procedural risks, benefits and alternatives. All questions were addressed. Maximal Sterile Barrier Technique was utilized including caps, mask, sterile gowns, sterile gloves, sterile drape, hand hygiene and skin antiseptic. A timeout was performed prior to the initiation of the procedure. CT of the abdomen was performed in a supine position. Localizing scans were performed with grids and sites marked on the skin in the upper right abdomen and mid to lower left abdomen. From a left lateral approach, an 18 gauge trocar needle was advanced into an anterior left-sided peritoneal fluid collection. After confirming needle tip position, aspiration was performed via the trocar needle utilizing syringes. A sample was sent for culture analysis. The needle was removed after additional CT was performed. A second 18 gauge trocar needle was then advanced from a left-sided approach into a left posterolateral peritoneal fluid collection. Aspiration was performed through the trocar needle. A fluid sample was sent for culture analysis. The needle was removed after additional CT imaging. An 18 gauge trocar needle was advanced in the right upper abdomen into the lateral perihepatic space. After confirming needle tip position and aspiration of fluid, a guidewire was advanced into the fluid collection. The percutaneous tract was dilated over the guidewire and a 10 French percutaneous drainage catheter advanced into the lateral perihepatic space. Catheter position was confirmed by CT. A fluid sample was withdrawn and sent for culture analysis. The drainage catheter was attached to a suction bulb and secured at the skin with a  Prolene retention suture and adhesive device. FINDINGS: Aspiration of the elongated left anterior fluid collection immediately deep to the anterior abdominal wall yielded thin, clear yellow fluid. A total volume of 90 mL of fluid was able to be aspirated from this region resulting in complete decompression of the collection by CT after aspiration. Aspiration of the teardrop shaped fluid collection along the lateral paracolic region of the peritoneal cavity yielded darker yellow fluid which was slightly turbid, especially towards the end of aspiration. Aspiration yielded 50 mL of fluid resulting in near complete decompression of the collection by CT after aspiration. Aspiration of the right lateral perihepatic fluid collection yielded thin, clear yellow fluid. Given that this is the largest single area fluid present in the peritoneal cavity currently, it was elected to place a drainage catheter in this fluid collection. IMPRESSION: 1. CT-guided needle aspiration of left anterior peritoneal fluid collection yielded 90 mL of thin, clear yellow fluid resulting in complete decompression. A fluid sample from this collection was sent for culture analysis. 2. CT-guided needle aspiration left posterolateral paracolic fluid collection yielded 50 mL of slightly turbid yellow fluid resulting in near complete decompression. A fluid sample was sent for culture analysis. 3. CT-guided  percutaneous catheter drainage was performed of the perihepatic fluid collection from a right lateral approach yielding thin, clear yellow fluid. A 10 French drainage catheter was placed and attached to suction bulb drainage. Electronically Signed   By: Aletta Edouard M.D.   On: 10/05/2022 16:22     Assessment/Plan: Adenocarcinoma of the rt colon- s/p hemicolectomy Intraabdominal abscesses  with worsening leucocytosis Exp lap on 09/28/22- no leak identified- but purulent fluid Today rt upper quadrant and 2 other collections aspirated- sent for  culture Continue meropenem micafungin until cultures are back Leucocytosis persisiting   Acute hypoxic resp failure with hypercapnia - r/o medication effect/ aspiration Pt was intubated,  now extubated   Acute encephalopathy- likely due to the above, co2 narcosis-- improved   AFIB with RVR- controlled   Anemia   AKI- worsening  Discussed the management with the care team

## 2022-10-06 NOTE — Progress Notes (Signed)
Patient extubated to 3l [place], per MD order, with no complications. Saturations on 3l are 97%.

## 2022-10-06 NOTE — Progress Notes (Signed)
Inpatient Rehab Admissions Coordinator:   Patient transferred to ICU and on ventilator now s/p abdominal exploration and drain placement. Will continue to follow patient progress for potential CIR admission once able to tolerate and medically stable.  Rehab Admissons Coordinator [place], [place], [place] [phone]

## 2022-10-06 NOTE — Progress Notes (Signed)
Nutrition Follow-up  DOCUMENTATION CODES:   Obesity unspecified  INTERVENTION:   Osmolite 1.5@70ml/hr- Initiate at 20ml/hr and increase by 10ml/hr q 8 hours until goal rate is reached.   ProSource TF 20- Give 60ml BID via tube, each supplement provides 80kcal and 20g of protein.   Free water flushes 30ml q4 hours to maintain tube patency   Regimen provides 2680kcal/day, 145g/day protein and 1460ml/day of free water.   Pt at low to moderate refeed risk; recommend monitor potassium, magnesium and phosphorus labs daily until stable  NUTRITION DIAGNOSIS:   Increased nutrient needs related to post-op healing as evidenced by estimated needs.  GOAL:   Patient will meet greater than or equal to 90% of their needs -previously met with TPN  MONITOR:   Labs, Weight trends, TF tolerance, Skin, I & O's, Diet advancement, TPN  ASSESSMENT:   66 y/o male with h/o DM, HTN, Afib s/p cardioversion, anxiety, HLD, hiatal hernia, cholelithiasis and colon cancer s/p open right colectomy with ileocolic anastomosis and cholecystectomy 10/19 complicated by anastamotic leak s/p exploratory laparotomy and abdominal washout and diverting loop ileostomy 10/23. Pt also with new DKA and AKI. 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  10/30- s/p IR CT aspiration of left anterior abdominal fluid collection, aspiration of left posterolateral abdominal fluid collection and catheter drainage of perihepatic fluid collection  Pt re-intubated 10/30 for respiratory distress. Pt has now been extubated. TPN has been on hold since yesterday. NGT placed today and noted in good position. Will plan to initiate trickle tube feeds and advance as tolerated. Will hold TPN for now as pt with volume overload. Pt is having ostomy function; no more blood noted in ostomy. Drains with 675ml output. Per chart, pt is up ~11lbs since admission.    Medications reviewed and include: colace, insulin,  protonix, albumin, meropenem, micafungin, phenylephrine,   Labs reviewed: Na 139 wnl, K 4.8 wnl, BUN 35(H), creat 1.52(H), P 4.0 wnl, Mg 1.8 wnl Triglycerides- 149 Wbc- 31.1(H), Hgb 8.0(L), Hct 25.7(L) Cbgs- 96, 99, 63, 77 x 24 hrs  Diet Order:    Diet Order             Diet NPO time specified  Diet effective now                  EDUCATION NEEDS:   No education needs have been identified at this time  Skin:  Skin Assessment: Skin Integrity Issues: Skin Integrity Issues:: DTI DTI: lt nare Incisions: closed abdomen  Last BM:  10/31- 180ml via ostomy  Height:   Ht Readings from Last 1 Encounters:  09/28/22 6' 2" (1.88 m)    Weight:   Wt Readings from Last 1 Encounters:  10/06/22 130.1 kg    Ideal Body Weight:  86.4 kg  BMI:  Body mass index is 36.83 kg/m.  Estimated Nutritional Needs:   Kcal:  2700-3000kcal/day  Protein:  135-150g/day  Fluid:  1.5L/day  Casey Campbell MS, RD, LDN Please refer to [facility] for RD and/or RD on-call/weekend/after hours pager 

## 2022-10-06 NOTE — Inpatient Diabetes Management (Signed)
Inpatient Diabetes Program Recommendations  AACE/ADA: New Consensus Statement on Inpatient Glycemic Control (2015)  Target Ranges:  Prepandial:   less than 140 mg/dL      Peak postprandial:   less than 180 mg/dL (1-2 hours)      Critically ill patients:  140 - 180 mg/dL   Lab Results  Component Value Date   GLUCAP 99 10/06/2022   HGBA1C 9.8 (H) 09/24/2022    Review of Glycemic Control  Latest Reference Range & Units 10/05/22 23:34 10/06/22 00:06 10/06/22 03:49 10/06/22 07:40 10/06/22 08:32  Glucose-Capillary 70 - 99 mg/dL 173 (H) 184 (H) 77 63 (L) 99  (H): Data is abnormally high (L): Data is abnormally low  Diabetes history: DM2 Outpatient Diabetes medications: Glipizide XL 10 mg BID, Metformin 500 mg QAM, Ozempic 1 mg Qweek Current orders for Inpatient glycemic control: Levemir 15 units BID, Novolog 0-20 units Q4H;  Novolog 5 units Q4H   Inpatient Diabetes Program Recommendations:    Appears TPN was held last night and TPN coverage,  Novolog 5 units Q4H continued to be administered resulting in mild hypoglycemia this morning of 63 mg/dL.  Please hold TPN coverage while TPN is held.  Please also consider increasing insulin in TPN to 50 units once restarted.  Then decreasing TPN coverage to Novolog 3 units Q4H.   Increasing insulin in TPN rather than SQ coverage reduces risk of hypoglycemia.    Will continue to follow while inpatient.  Thank you, Reche Dixon, MSN, [facility] Coordinator Inpatient Diabetes Program [phone] (team pager from 8a-5p)

## 2022-10-06 NOTE — Progress Notes (Signed)
OT Cancellation Note  Patient Details Name: INGRAM ONNEN MRN: [id number] DOB: 1956/01/07   Cancelled Treatment:    Reason Eval/Treat Not Completed: Medical issues which prohibited therapy;Patient not medically ready.  Chart reviewed - pt has transitioned to higher level of care and intubated. Will sign off at this time, please re-consult if pt becomes medically appropriate for therapy.   Dessie Coma, M.S. OTR/L  10/06/22, 8:18 AM  ascom [phone]

## 2022-10-06 NOTE — Progress Notes (Signed)
[facility] Hospital Day(s): 12.   Post op day(s): 8 Days Post-Op.   Interval History:  Patient seen and examined Patient underwent percutaneous aspiration x2 and drain placement x1 of intra-abdominal collection. Following procedure, he became lethargic with swallow breathing. Transferred to ICU and re-intubated.  He is on 80 mcg/min phenylephrine His leukocytosis is improved but remains quite high; 31.1K Hgb to 8.2 Renal function stable; sCr - 1.45; UO - 4100 ccs No electrolyte derangements  Lactic acidosis normalized; 1.4 Surgical drain with minimal output; serous New percutaneous drain (RUQ); 675 ccs; serous Cx x3 from (10/30) are without growth He continues on Meropenem; ID now on-board; added micafungin He continues to have ostomy function; 180 ccs recorded  TPN held this AM  Vital signs in last 24 hours: [min-max] current  Temp:  [96.6 F (35.9 C)-99 F (37.2 C)] 98.6 F (37 C) (10/31 0700) Pulse Rate:  [66-119] 82 (10/31 0700) Resp:  [15-36] 21 (10/31 0700) BP: (65-161)/(45-111) 108/61 (10/31 0700) SpO2:  [91 %-100 %] 100 % (10/31 0700) FiO2 (%):  [50 %-60 %] 50 % (10/31 0630) Weight:  [130.1 kg] 130.1 kg (10/31 0500)     Height: '6\' 2"'$  (188 cm) Weight: 130.1 kg BMI (Calculated): 36.81   Intake/Output last 2 shifts:  10/30 0701 - 10/31 0700 In: 2759.3 [I.V.:2078.6; Blood:284; NG/GT:30; IV Piggyback:366.7] Out: 9702 [Urine:4100; Emesis/NG output:250; Drains:675; Stool:180]   Physical Exam:  Constitutional: Intubated; opens eyes to verbal stimuli Respiratory: Intubate; on ventilator Cardiac: Appears regular rate and rhythm on monitor  Gastrointestinal: Soft, non-distended, no rebound/guarding. Surgical drain with serosanguinous output. 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 Newly placed drain in RUQ; serous fluid Genitourinary: Foley in place; urine clear  Integumentary:  Midline laparotomy healing via secondary intention, no erythema   Labs:     Latest Ref Rng & Units 10/06/2022    4:57 AM 10/06/2022    1:10 AM 10/06/2022   12:13 AM  CBC  WBC 4.0 - 10.5 K/uL 31.1     Hemoglobin 13.0 - 17.0 g/dL 8.2  8.8  6.1   Hematocrit 39.0 - 52.0 % 27.3  29.1  18.8   Platelets 150 - 400 K/uL 499         Latest Ref Rng & Units 10/06/2022    4:57 AM 10/06/2022    1:10 AM 10/05/2022   10:07 PM  CMP  Glucose 70 - 99 mg/dL 71  156  246   BUN 8 - 23 mg/dL 35  35  33   Creatinine 0.61 - 1.24 mg/dL 1.52  1.45  1.36   Sodium 135 - 145 mmol/L 139  138  136   Potassium 3.5 - 5.1 mmol/L 4.8  4.9  5.6   Chloride 98 - 111 mmol/L 107  107  106   CO2 22 - 32 mmol/L '26  26  26   '$ Calcium 8.9 - 10.3 mg/dL 8.7  8.7  8.2   Total Protein 6.5 - 8.1 g/dL  6.7  6.2   Total Bilirubin 0.3 - 1.2 mg/dL  0.5  0.5   Alkaline Phos 38 - 126 U/L  64  63   AST 15 - 41 U/L  20  21   ALT 0 - 44 U/L  11  11     Imaging studies: No new pertinent imaging studies   Assessment/Plan:  66 y.o. male re-intubated and on vasopressor support 8 Days  Post-Op s/p exploratory laparotomy, abdominal washout, and diverting loop ileostomy for intra-abdominal purulent fluid after initial open right colectomy with open cholecystectomy for right colon cancer and cholelithiasis on 10/19    - Greatly appreciate PCCM support; Vasopressor and ventilator support per their service  - TPN held this AM; may consider enteric feeds via OG             - Continue IV Abx (Meropenem); now also on Micafungin; ID following  - Follow up 10/30 Cx; no growth to date             - Continue foley catheter for UO monitoring; sCr improved slightly, making urine; nephrology following             - Monitor abdominal examination; on-going ileostomy function             - Midline wound care: Pack daily with saline moistened gauze, cover, secure             - Continue surgical drain; monitor and record output             - Pain  control prn; antiemetics prn             - Appreciate cardiology assistance as well   - Therapies on board; recommendations are CIR however patient did not work with therapies over the weekend   All of the above findings and recommendations were discussed with the medical team  -- Edison Simon, PA-C [facility] 10/06/2022, 7:17 AM M-F: 7am - 4pm

## 2022-10-06 NOTE — Consult Note (Signed)
NAME:  Nicolas Zuniga, MRN:  [id number], DOB:  [birth year], LOS: 37 ADMISSION DATE:  09/24/2022, CONSULTATION DATE: 09/28/22 REFERRING MD: Dr. Dahlia Byes   History of Present Illness:  66 yo male with right colon cancer and cholelithiasis who presented to [facility] on 10/19 for laparoscopic right colectomy and cholecystectomy.  Surgical findings were right colon mass with evidence of bulky mesenteric nodal involvement; large right colon mass adhered to the retroperitoneum; mesenteric nodes within terminal ileum; tension free anastomosis, no evidence of intraop leak and good perfusion; and chronic calculus cholecystitis.  Postop pt admitted to the medsurg unit per general surgery for additional workup and treatment.  Prolonged hospital course, please see detailed hospital course under significant events. Cardiology, PCCM, General Surgery, TRH, Infectious Disease, Psychiatry & Nephrology all involved in care. 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  Hospitalist team consulted to assist with management all beta blockers and flecainide discontinued  10/22: Pt developed atrial fibrillation with rvr, acute respiratory failure, and hypotension with questionable episode of Vtach, rapid response initiated.  Pt required amiodarone bolus followed by drip and transfer to the stepdown unit.  Rate remained  uncontrolled and cardizem gtt added _0  mg/hr in addition to amiodarone gtt.  Cardiology consulted   10/22: CT Abd Pelvis concerning for possible small leak but no contrast extravasation.  General Surgery recommended- antibiotic therapy; repeat scan in 24-48hrs; and keep NPO except meds  10/23: revealed no evidence of gross enteric contrast extravasation but increased fluid collection adjacent to the anastomosis and slight increase in fluid volume in the right paracolic gutter.  Pt taken back to the OR for exploratory laparotomy with findings of no obvious anastomotic leak or evidence of large or small bowel injuries but intra-abdominal purulent fluid with significant inflammatory response present. Abdominal washout with placement of JP drain and diverting loop colostomy. Pt remained mechanically intubated.  PCCM        team consulted plans for SBT and possible extubation  10/24: Pt with worsening acute renal failure Nephrology consulted.  Pt with bradycardia overnight cardizem and precedex gtts discontinued.  Pt remains on amiodarone gtt at 30 mg/hr  Pt alert and following commands plans for possible SBT today ~ EXTUBATED 10/25: Tolerating extubation from respiratory standpoint. Off Vasopressors. Slight worsening of AKI, starting to make urine.  DKA resolved, weaning off insulin gtt to SQ insulin.  Atrial fibrillation rate controlled on Amiodarone.  10/26: transfer to [facility] service, NG tube was clamped and output is very minimal, general surgery recommended to start clear liquid diet, NGT removed. No significant overnight events  10/27: Clinically continues to improve, creat trending down, better HD. WBC up bu no fevers and drain is serous and midline wound is healing well. Probably time for diuresis. If wbc continue to be up or if he spikes a fever or has any set back will need repeat CT A/P w po contrast only. 10/28:  No significant events overnight.  In the morning time patient was complaining of  some  abdominal discomfort, feeling better now.  WBC count 35.5 significant elevated, discussed with general surgery, CT a/p shows free gas, multiple fluid collections without definitive abscess, ESR 89, Pro-Cal 0.88, CRP 23.3. Tolerating diet well for now.  Denied any chest pain or palpitations.  10/29: Psych consulted for anxiety. Fluid culture from JP drain and meropenem started due to persistent leukocytosis. Sputum culture- serratia marcescens- resistant to cefazolin only s/p zosyn, on meropenem. 10/30: Overnight bright red blood reported per ileostomy, heparin held. Hgb stable. IR consulted for persistent leukocytosis and management of intra-abdominal fluid collections with plan for aspiration and possible drain placement. 2 fluid collections aspirated and 3rd fluid collection underwent drain placement. Post procedure patient transferred to ICU urgently due to worsening mental status, hypercapnia and increased WOB. Patient required emergent intubation and mechanical ventilatory support upon arrival. 10/31 successfully extubated   Significant Test:  10/22: CT Abd Pelvis Ill-defined slightly high density fluid along the ileocolic suture line, which may represent a small amount of extravasated contrast/leak. Small to moderate amount of pneumoperitoneum and small amount of ascites within the abdomen and pelvis, not unexpected given surgery but  nonspecific. No evidence of focal collection/defined abscess. Trace bilateral pleural effusions and bibasilar atelectasis. 2 low-density areas within the prostate gland, nonspecific but correlate with PSA. Aortic Atherosclerosis (ICD10-I70.0). 10/22: CTA Chest no definite evidence of pulmonary embolus. Minimal bilateral pleural effusions are noted with adjacent subsegmental atelectasis. Pneumoperitoneum is noted in visualized portion of upper abdomen consistent with reported history of recent laparoscopic right colectomy and cholecystectomy. 10/23: CT Abd Pelvis  while there is no evidence for gross enteric contrast extravasation at the level of the ileocolic anastomosis, the small fluid collection adjacent to the anastomosis identified on yesterday's study has increased in attenuation in the interval. This increase in attenuation is concerning for some trace spill of contrast from the anastomosis into the collection. Slight increase in fluid volume in the right paracolic gutter with similar fluid volume around the liver and in the left paracolic gutter. Collections of interloop mesenteric fluid are not substantially changed. Free gas in the peritoneal cavity is not unexpected 4 days out from surgery. Gas in the midline subcutaneous tissues deep to the staple line has increased in the interval. Contrast in the kidneys compatible with residual from yesterday's infused scan. Bibasilar atelectasis with tiny bilateral pleural effusions. Aortic Atherosclerosis (ICD10-I70.0).  10/28 CT A/P: Decreased free gas, multiple fluid collections no definitive abscess due to lack of IV contrast.  Small bilateral pleural effusions.  10/30 IR CT aspiration of left anterior abdominal fluid collection, aspiration of left posterolateral abdominal fluid collection and catheter drainage of perihepatic fluid collection: Left anterior fluid collection: clear, yellow fluid. Sample sent for culture. 90 mL aspirated via 18 G trocar needle resulting in complete decompression. Left posterolateral fluid collection: slightly turbid, darker yellow fluid. Sample sent for culture. 50 mL aspirated resulting in near-complete decompression. Perihepatic fluid collection: clear, yellow fluid. 10 Fr drain placed in perihepatic fluid collection given its larger size. Drain attached to suction bulb drainage.   Interim History / Subjective:   Patient now extubated, alert and awake following commands Minimal oxygen Not on pressors Very weak cough Generalized weakness Dubhoff placed for enteral  feeds  Objective   Blood pressure 127/63, pulse 94, temperature 97.6 F (36.4 C), temperature source Oral, resp. rate (!) 29, height 6' 2" (1.88 m), weight 130.1 kg, SpO2 98 %.    Vent Mode: PSV;Spontaneous FiO2 (%):  [35 %-60 %] 35 %  Set Rate:  [15 bmp] 15 bmp Vt Set:  [550 mL] 550 mL PEEP:  [5 cmH20] 5 cmH20 Pressure Support:  [5 cmH20] 5 cmH20 Plateau Pressure:  [14 cmH20] 14 cmH20   Intake/Output Summary (Last 24 hours) at 10/06/2022 1820 Last data filed at 10/06/2022 1700 Gross per 24 hour  Intake 2701.61 ml  Output 1805 ml  Net 896.61 ml    Filed Weights   10/01/22 0427 10/05/22 0434 10/06/22 0500  Weight: (!) 138.9 kg 67.1 kg 130.1 kg   Examination: General: Adult male, critically ill NAD Neuro: No focal deficits CV: s1s2 irregular Pulm: CTA b/l +rhonchi GI: soft, rounded, bs x 4 abdominal IR sites and midline incision clean, dry, intact- no rashes/lesions noted GU: foley in place with clear yellow urine Skin:  Pressure Injury 09/30/22 Nare Left Deep Tissue Pressure Injury - Purple or maroon localized area of discolored intact skin or blood-filled blister due to damage of underlying soft tissue from pressure and/or shear. nonblanchable, red/purple bruise at lef (Active)  09/30/22 1100  Location: Nare  Location Orientation: Left  Staging: Deep Tissue Pressure Injury - Purple or maroon localized area of discolored intact skin or blood-filled blister due to damage of underlying soft tissue from pressure and/or shear.  Wound Description (Comments): nonblanchable, red/purple bruise at left nare  Present on Admission:   Extremities: warm/dry, pulses + 2 R/P, +2 edema noted BLE  Resolved Hospital Problem list   Bradycardia  Hypotension s/t hypovolemia & septic shock Mechanical intubation intraoperatively for airway protection ~ EXTUBATED 10/24 Anion gap metabolic acidosis ~ RESOLVED DKA Atrial Fibrillation with RVR Hyponatremia   Assessment & Plan:  Acute  Hypoxic/Hypercapnic Respiratory Failure secondary to suspected aspiration and  sedation post-operatively requiring emergent intubation and mechanical ventilatory support Resp failure resolved Oxygen as needed Pulmonary tiolet PT/OT Aspiration precautions  Paroxsymal Atrial fibrillation  Hypotension multifocal in the setting of acute blood loss anemia & sedation in the setting of mechanical ventilation HFpEF Hx: HTN and Chronic diastolic dysfunction  ECHOCARDIOGRAM 09/29/22: LVEF 32-44%, normal diastolic function, RV function normal S/p amiodarone infusion - heparin drip on hold for now - Continue amiodarone 200 mg daily  ACUTE ANEMIA-secondary to Acute Blood Loss TRANSFUSE AS NEEDED CONSIDER TRANSFUSION  IF HGB<7 DVT PRX with TED/SCD's ONLY - Monitor for s/s of bleeding - heparin will remain stopped overnight  Acute kidney injury secondary ATN ~ improving Baseline Cr: 1.06, Cr on admission: 2.2, as high as 4.61 >> now trending down, most recent: 1.32 (10/30) - Strict I/O's: alert provider if UOP < 0.5 mL/kg/hr - Daily BMP, replace electrolytes PRN - Avoid nephrotoxic agents as able, ensure adequate renal perfusion - Re-Consult nephrology if indicated     Right colon cancer and cholelithiasis s/p laparoscopic right colectomy and cholecystectomy: 09/24/22 Intra-abdominal seromas s/p exploratory laparotomy with abdominal washout and diverting loop ileostomy 10/23 S/p CT aspiration of left anterior abdominal fluid collection, aspiration of left posterolateral abdominal fluid collection and catheter drainage of perihepatic fluid collection 10/30 - f/u culture, daily CBC, monitor WBC/fever curve - continue meropenem >> ID following, appreciate input - General Surgery & IR following appreciate input - NGT to LIS HOLD TPN START TF's via DUBHOFF   ENDO - ICU hypoglycemic\Hyperglycemia protocol -check FSBS per protocol   ELECTROLYTES -follow labs as needed -replace as  needed -pharmacy consultation and following    Best Practice (right click and "Reselect all SmartList Selections" daily)  Diet/type: NPO DVT prophylaxis: SCD GI prophylaxis: PPI Lines: Right upper extremity  PICC and still needed  Foley:  Yes, and it is still needed Code Status:  full code   Labs   CBC: Recent Labs  Lab 10/02/22 0524 10/03/22 0426 10/04/22 0538 10/04/22 2200 10/05/22 0440 10/05/22 1842 10/06/22 0013 10/06/22 0110 10/06/22 0457 10/06/22 1135  WBC 29.5* 35.5* 36.9* 34.7* 31.9* 29.4*  --   --  31.1*  --   NEUTROABS 22.8* 29.8* 27.2*  --  24.6*  --   --   --  22.8*  --   HGB 7.7* 8.8* 7.8* 8.0* 7.2* 7.4* 6.1* 8.8* 8.2* 8.0*  HCT 24.4* 27.8* 24.1* 26.0* 23.3* 23.0* 18.8* 29.1* 27.3* 25.7*  MCV 73.9* 73.5* 73.9* 76.0* 77.9* 79.0*  --   --  81.0  --   PLT 261 324 367 388 405* 402*  --   --  499*  --      Basic Metabolic Panel: Recent Labs  Lab 10/03/22 0426 10/04/22 0538 10/05/22 0440 10/05/22 1842 10/05/22 2045 10/05/22 2207 10/06/22 0110 10/06/22 0457  NA 136 134* 136 127*  --  136 138 139  K 4.2 4.1 4.4 5.3*  --  5.6* 4.9 4.8  CL 105 102 106 99  --  106 107 107  CO2 _0 --  _1 GLUCOSE 283* 313* 248* 446*  --  246* 156* 71  BUN 38* 33* 31* 31*  --  33* 35* 35*  CREATININE 2.23* 1.67* 1.32* 1.24  --  1.36* 1.45* 1.52*  CALCIUM 8.4* 8.4* 8.4* 7.6*  --  8.2* 8.7* 8.7*  MG 1.8 1.7 1.8  --  1.7  --   --  1.8  PHOS 2.6 3.1 3.1  --  3.8  --   --  4.0    GFR: Estimated Creatinine Clearance: 68.6 mL/min (A) (by C-G formula based on SCr of 1.52 mg/dL (H)). Recent Labs  Lab 09/30/22 0413 10/01/22 0136 10/03/22 0422 10/03/22 0426 10/04/22 2200 10/05/22 0440 10/05/22 1842 10/06/22 0022 10/06/22 0110 10/06/22 0457  PROCALCITON 20.55  --  0.88  --   --   --   --   --   --   --   WBC 14.7*   < >  --    < > 34.7* 31.9* 29.4*  --   --  31.1*  LATICACIDVEN  --   --   --   --   --   --  1.2 1.3 2.0* 1.4   < > = values in this  interval not displayed.     Liver Function Tests: Recent Labs  Lab 10/05/22 1842 10/05/22 2207 10/06/22 0110  AST _2 ALT _3 ALKPHOS 59 63 64  BILITOT 1.1 0.5 0.5  PROT 5.7* 6.2* 6.7  ALBUMIN 1.6* 1.7* 2.2*    No results for input(s): "LIPASE", "AMYLASE" in the last 168 hours. No results for input(s): "AMMONIA" in the last 168 hours.  ABG    Component Value Date/Time   PHART 7.29 (L) 10/05/2022 1920   PCO2ART 57 (H) 10/05/2022 1920   PO2ART 90 10/05/2022 1920   HCO3 27.4 10/05/2022 1920   ACIDBASEDEF 0.3 10/05/2022 1920   O2SAT 98 10/05/2022 1920     Coagulation Profile: Recent Labs  Lab 10/05/22 0440  INR 1.3*     Cardiac Enzymes: No results for input(s): "CKTOTAL", "CKMB", "CKMBINDEX", "TROPONINI" in the last 168 hours.  HbA1C: Hemoglobin A1C  Date/Time Value Ref Range Status  06/16/2012 10:17 AM  7.2 (H) 4.2 - 6.3 % Final    Comment:    The American Diabetes Association recommends that a primary goal of therapy should be <7% and that physicians should reevaluate the treatment regimen in patients with HbA1c values consistently >8%.    Hgb A1c MFr Bld  Date/Time Value Ref Range Status  09/24/2022 05:44 PM 9.8 (H) 4.8 - 5.6 % Final    Comment:    (NOTE) Pre diabetes:          5.7%-6.4%  Diabetes:              >6.4%  Glycemic control for   <7.0% adults with diabetes   08/08/2021 01:43 PM 7.4 (H) 4.8 - 5.6 % Final    Comment:    (NOTE) Pre diabetes:          5.7%-6.4%  Diabetes:              >6.4%  Glycemic control for   <7.0% adults with diabetes     CBG: Recent Labs  Lab 10/06/22 0349 10/06/22 0740 10/06/22 0832 10/06/22 1133 10/06/22 1620  GLUCAP 77 63* 99 96 99     Surgical History:   Past Surgical History:  Procedure Laterality Date   CARDIOVERSION N/A 12/22/2018   Procedure: CARDIOVERSION (CATH LAB);  Surgeon: Corey Skains, MD;  Location: [facility] ORS;  Service: Cardiovascular;  Laterality: N/A;    CARDIOVERSION N/A 08/18/2021   Procedure: CARDIOVERSION;  Surgeon: Corey Skains, MD;  Location: [facility] ORS;  Service: Cardiovascular;  Laterality: N/A;   CARDIOVERSION N/A 03/11/2022   Procedure: CARDIOVERSION;  Surgeon: Corey Skains, MD;  Location: [facility] ORS;  Service: Cardiovascular;  Laterality: N/A;   CARDIOVERSION N/A 03/31/2022   Procedure: CARDIOVERSION;  Surgeon: Corey Skains, MD;  Location: [facility] ORS;  Service: Cardiovascular;  Laterality: N/A;   CHOLECYSTECTOMY N/A 09/24/2022   Procedure: LAPAROSCOPIC CHOLECYSTECTOMY;  Surgeon: Jules Husbands, MD;  Location: [facility] ORS;  Service: General;  Laterality: N/A;   COLONOSCOPY WITH PROPOFOL N/A 09/08/2022   Procedure: COLONOSCOPY WITH PROPOFOL;  Surgeon: Lucilla Lame, MD;  Location: [facility] ENDOSCOPY;  Service: Endoscopy;  Laterality: N/A;   ELECTROPHYSIOLOGIC STUDY N/A 07/09/2016   Procedure: CARDIOVERSION;  Surgeon: Dionisio David, MD;  Location: [facility] ORS;  Service: Cardiovascular;  Laterality: N/A;   LAPAROSCOPIC RIGHT COLECTOMY N/A 09/24/2022   Procedure: LAPAROSCOPIC RIGHT COLECTOMY, RNFA to assist;  Surgeon: Jules Husbands, MD;  Location: [facility] ORS;  Service: General;  Laterality: N/A;   LAPAROTOMY N/A 09/28/2022   Procedure: EXPLORATORY LAPAROTOMY WITH OSTOMY CREATION;  Surgeon: Jules Husbands, MD;  Location: [facility] ORS;  Service: General;  Laterality: N/A;   TEE WITHOUT CARDIOVERSION N/A 07/09/2016   Procedure: TRANSESOPHAGEAL ECHOCARDIOGRAM (TEE);  Surgeon: Dionisio David, MD;  Location: [facility] ORS;  Service: Cardiovascular;  Laterality: N/A;   web fingers repaired     as a child     No Known Allergies   Home Medications  Prior to Admission medications   Medication Sig Start Date End Date Taking? Authorizing Provider  bisacodyl (DULCOLAX) 5 MG EC tablet Take all 4 tablets at 8 am the morning prior to your surgery. 09/14/22  Yes Pabon, Diego F, MD  Cholecalciferol (VITAMIN D) 50 MCG (2000 UT) CAPS Take 2,000 Units by mouth  daily.   Yes [provider]  flecainide (TAMBOCOR) 100 MG tablet Take 100 mg by mouth 2 (two) times daily. 03/17/22  Yes [provider]  glipiZIDE (GLUCOTROL XL) 10 MG  24 hr tablet Take 10 mg by mouth 2 (two) times daily.    Yes [provider]  metFORMIN (GLUCOPHAGE-XR) 500 MG 24 hr tablet Take 500 mg by mouth daily with breakfast. 02/10/21  Yes [provider]  metoprolol (LOPRESSOR) 100 MG tablet Take 100 mg by mouth 2 (two) times daily.   Yes [provider]  metroNIDAZOLE (FLAGYL) 500 MG tablet Take 2 tablets at 8AM, take 2 tablets at [facility], and take 2 tablets at 8PM the day prior to your surgery 09/14/22  Yes Pabon, Diego F, MD  Multiple Vitamins-Minerals (MULTIVITAMIN WITH MINERALS) tablet Take 1 tablet by mouth daily with breakfast.    Yes [provider]  neomycin (MYCIFRADIN) 500 MG tablet Take 2 tablet at 8am, take 2 tablets at 2pm, and take 2 tablets at 8pm the day prior to your surgery 09/14/22  Yes Pabon, Diego F, MD  Omega-3 Fatty Acids (FISH OIL) 1000 MG CAPS Take 1,000 mg by mouth daily.   Yes [provider]  OZEMPIC, 1 MG/DOSE, 4 MG/3ML SOPN Inject 1 mg into the skin every Wednesday. Patient stopped taking these medications 08/02/21  Yes [provider]  polyethylene glycol powder (MIRALAX) 17 GM/SCOOP powder Mix full container in 64 ounces of Gatorade or other clear liquid. NO Red 09/14/22  Yes Pabon, Crane, MD  Turmeric 400 MG CAPS Take 400 mg by mouth daily.   Yes [provider]  vitamin B-12 (CYANOCOBALAMIN) 100 MCG tablet Take 500 mcg by mouth daily.   Yes [provider]  Vitamin E 400 units TABS Take 400 Units by mouth daily.   Yes [provider]  apixaban (ELIQUIS) 5 MG TABS tablet Take 5 mg by mouth 2 (two) times daily.    [provider]  Coenzyme Q10 (COQ10) 100 MG CAPS Take 100 mg by mouth daily.    [provider]       DVT/GI PRX  assessed I Assessed  the need for Labs I Assessed the need for Foley I Assessed the need for Central Venous Line Family Discussion when available I Assessed the need for Mobilization I made an Assessment of medications to be adjusted accordingly Safety Risk assessment completed  CASE DISCUSSED IN MULTIDISCIPLINARY ROUNDS WITH ICU TEAM   Now SD and can transfer to [facility]  Corrin Parker, M.D.  [facility]  Medical Director [place] Director [place] Department

## 2022-10-06 NOTE — Progress Notes (Signed)
PT Cancellation Note  Patient Details Name: Nicolas Zuniga MRN: [id number] DOB: [birth year]   Cancelled Treatment:    Reason Eval/Treat Not Completed: Patient not medically ready Chart reviewed - pt has transitioned to higher level of care and intubated. Will sign off at this time, please re-consult if pt becomes medically appropriate for therapy.   Lavone Nian, PT, DPT 10/06/22, 10:13 AM   [name] 10/06/2022, 10:13 AM

## 2022-10-06 NOTE — Progress Notes (Signed)
0800: Pt glucose level 63; insulin held D50 59m given.   1115: Pt extubated to 3L . Following commands; speech hoarse.  1200: Dob Hoff placed to achieve desired nutrition. KUB ordered for placement confirmation.   1400: Nutrition initiated at 270mper dietitian.  1700: Pt intermittently confused; easy to reorient to surroundings and situation.

## 2022-10-06 NOTE — Progress Notes (Addendum)
[place] NOTE       Patient ID: Nicolas Zuniga MRN: [id number] DOB/AGE: 1956-01-22 66 y.o.  Admit date: 09/24/2022 Referring Physician Sharion Settler, NP  Primary Physician Dr. Hortencia Pilar ([facility])  Primary Cardiologist Nehemiah Massed Reason for Consultation Af RVR  HPI: Nicolas Zuniga is a 23FTD with a PMH of paroxysmal atrial fibrillation (on flecainide, metoprolol, and Eliquis), HFpEF (EF >55, G1 DD 04/2022), type II diabetes, hypertension, recently diagnosed adenocarcinoma of the colon who presented to [facility] 09/24/2022 for and elective laparoscopic right colectomy ultimately converted to open right colectomy and cholecystectomy.  Postoperative course was complicated by hypotension and bradycardia the evening of postop day 1, concerning for beta-blocker toxicity for which he was started on glucagon infusion.  Rapid response was called overnight on 10/21 for atrial fibrillation with RVR and questionable VT, was started on amiodarone and diltiazem infusions with some heart rate response.  On 10/23 he underwent exploratory laparotomy, abdominal washout, and diverting loop ileostomy for intra-abdominal purulent fluid seen on CT abdomen pelvis.  Postoperatively, he remained intubated and requiring vasopressor support.  He converted to atrial flutter with variable conduction the evening of 10/23, Extubated 10/24. He has remained in AF with acceptable rate control on amiodarone and metoprolol. Heparin infusion paused the AM of 10/30 for bloody ostomy output. Required reintubation 10/30.  Converted from AF to NSR the morning of 10/31.  Interval history: -s/p percutaneous RUQ drain with IR on 10/30 -mental status worsened yesterday afternoon, transferred back to the ICU the evening and required reintubation for increased WOB and hypercapnia -required levophed & phenylephrine overnight, now on phenyl only -Converted to sinus rhythm this morning   Past Medical History:   Diagnosis Date   Adenocarcinoma of colon ([place]) 09/08/2022   Cholelithiasis    Chronic gastritis    Colon polyps    Coronary artery calcification seen on CT scan    Diastolic dysfunction    a.) TTE 04/09/2022: EF >55%, triv TR, G1DD   Diverticulosis    Hypertension    Long term current use of anticoagulant    a.) apixaban   Nephrolithiasis    Paroxysmal A-fib ([place])    a.) CHA2DS2VASc = 3 (age, HTN, T2DM);  b.) s/p DCCV 07/09/2016 (100 J x1), 12/22/2018 (120 J x1), 08/18/2021 (120 J x1), 03/11/2022 (120 J x1), 03/31/2022 (120 J x1); c.) rate/rhythm maintained on oral flecanide + metoprolol succinate; chronically anticoagulated with apixaban   T2DM (type 2 diabetes mellitus) ([place])     Past Surgical History:  Procedure Laterality Date   CARDIOVERSION N/A 12/22/2018   Procedure: CARDIOVERSION (CATH LAB);  Surgeon: Corey Skains, MD;  Location: [facility] ORS;  Service: Cardiovascular;  Laterality: N/A;   CARDIOVERSION N/A 08/18/2021   Procedure: CARDIOVERSION;  Surgeon: Corey Skains, MD;  Location: [facility] ORS;  Service: Cardiovascular;  Laterality: N/A;   CARDIOVERSION N/A 03/11/2022   Procedure: CARDIOVERSION;  Surgeon: Corey Skains, MD;  Location: [facility] ORS;  Service: Cardiovascular;  Laterality: N/A;   CARDIOVERSION N/A 03/31/2022   Procedure: CARDIOVERSION;  Surgeon: Corey Skains, MD;  Location: [facility] ORS;  Service: Cardiovascular;  Laterality: N/A;   CHOLECYSTECTOMY N/A 09/24/2022   Procedure: LAPAROSCOPIC CHOLECYSTECTOMY;  Surgeon: Jules Husbands, MD;  Location: [facility] ORS;  Service: General;  Laterality: N/A;   COLONOSCOPY WITH PROPOFOL N/A 09/08/2022   Procedure: COLONOSCOPY WITH PROPOFOL;  Surgeon: Lucilla Lame, MD;  Location: [facility] ENDOSCOPY;  Service: Endoscopy;  Laterality: N/A;   ELECTROPHYSIOLOGIC STUDY N/A  07/09/2016   Procedure: CARDIOVERSION;  Surgeon: Dionisio David, MD;  Location: [facility] ORS;  Service: Cardiovascular;  Laterality: N/A;   LAPAROSCOPIC RIGHT  COLECTOMY N/A 09/24/2022   Procedure: LAPAROSCOPIC RIGHT COLECTOMY, RNFA to assist;  Surgeon: Jules Husbands, MD;  Location: [facility] ORS;  Service: General;  Laterality: N/A;   LAPAROTOMY N/A 09/28/2022   Procedure: EXPLORATORY LAPAROTOMY WITH OSTOMY CREATION;  Surgeon: Jules Husbands, MD;  Location: [facility] ORS;  Service: General;  Laterality: N/A;   TEE WITHOUT CARDIOVERSION N/A 07/09/2016   Procedure: TRANSESOPHAGEAL ECHOCARDIOGRAM (TEE);  Surgeon: Dionisio David, MD;  Location: [facility] ORS;  Service: Cardiovascular;  Laterality: N/A;   web fingers repaired     as a child    Medications Prior to Admission  Medication Sig Dispense Refill Last Dose   bisacodyl (DULCOLAX) 5 MG EC tablet Take all 4 tablets at 8 am the morning prior to your surgery. 4 tablet 0 09/23/2022   Cholecalciferol (VITAMIN D) 50 MCG (2000 UT) CAPS Take 2,000 Units by mouth daily.   Past Week   flecainide (TAMBOCOR) 100 MG tablet Take 100 mg by mouth 2 (two) times daily.   09/24/2022   glipiZIDE (GLUCOTROL XL) 10 MG 24 hr tablet Take 10 mg by mouth 2 (two) times daily.    09/24/2022   metFORMIN (GLUCOPHAGE-XR) 500 MG 24 hr tablet Take 500 mg by mouth daily with breakfast.   Past Month   metoprolol (LOPRESSOR) 100 MG tablet Take 100 mg by mouth 2 (two) times daily.   09/24/2022   metroNIDAZOLE (FLAGYL) 500 MG tablet Take 2 tablets at 8AM, take 2 tablets at [facility], and take 2 tablets at 8PM the day prior to your surgery 6 tablet 0 09/23/2022   Multiple Vitamins-Minerals (MULTIVITAMIN WITH MINERALS) tablet Take 1 tablet by mouth daily with breakfast.    Past Week   neomycin (MYCIFRADIN) 500 MG tablet Take 2 tablet at 8am, take 2 tablets at 2pm, and take 2 tablets at 8pm the day prior to your surgery 6 tablet 0 09/23/2022   Omega-3 Fatty Acids (FISH OIL) 1000 MG CAPS Take 1,000 mg by mouth daily.   Past Week   OZEMPIC, 1 MG/DOSE, 4 MG/3ML SOPN Inject 1 mg into the skin every Wednesday. Patient stopped taking these medications   Past Month    polyethylene glycol powder (MIRALAX) 17 GM/SCOOP powder Mix full container in 64 ounces of Gatorade or other clear liquid. NO Red 238 g 0 09/23/2022   Turmeric 400 MG CAPS Take 400 mg by mouth daily.   Past Week   vitamin B-12 (CYANOCOBALAMIN) 100 MCG tablet Take 500 mcg by mouth daily.   Past Week   Vitamin E 400 units TABS Take 400 Units by mouth daily.   Past Week   apixaban (ELIQUIS) 5 MG TABS tablet Take 5 mg by mouth 2 (two) times daily.   09/21/2022   Coenzyme Q10 (COQ10) 100 MG CAPS Take 100 mg by mouth daily.   09/21/2022   Social History   Socioeconomic History   Marital status: Married    Spouse name: Not on file   Number of children: Not on file   Years of education: Not on file   Highest education level: Not on file  Occupational History   Not on file  Tobacco Use   Smoking status: Never    Passive exposure: Never   Smokeless tobacco: Never  Vaping Use   Vaping Use: Never used  Substance and Sexual Activity  Alcohol use: No   Drug use: No   Sexual activity: Not on file  Other Topics Concern   Not on file  Social History Narrative   Lives with wife, [name], with on pet, cat.   Social Determinants of Health   Financial Resource Strain: Not on file  Food Insecurity: No Food Insecurity (09/24/2022)   Hunger Vital Sign    Worried About Running Out of Food in the Last Year: Never true    Ran Out of Food in the Last Year: Never true  Transportation Needs: No Transportation Needs (09/24/2022)   PRAPARE - Hydrologist (Medical): No    Lack of Transportation (Non-Medical): No  Physical Activity: Not on file  Stress: Not on file  Social Connections: Not on file  Intimate Partner Violence: Not At Risk (09/24/2022)   Humiliation, Afraid, Rape, and Kick questionnaire    Fear of Current or Ex-Partner: No    Emotionally Abused: No    Physically Abused: No    Sexually Abused: No    Family History  Problem Relation Age of Onset    Varicose Veins Mother    Heart disease Mother    Diabetes Mother    COPD Father      Vitals:   10/06/22 0600 10/06/22 0630 10/06/22 0700 10/06/22 0755  BP: 118/63 115/61 108/61   Pulse: 81 81 82   Resp: (!) 21 (!) 22 (!) 21   Temp: 99 F (37.2 C) 98.6 F (37 C) 98.6 F (37 C)   TempSrc:      SpO2: 100% 100% 100% 96%  Weight:      Height:        PHYSICAL EXAM General: acutely ill-appearing middle-aged Caucasian male, laying at incline in hospital bed, intubated and sedated, wife at bedside HEENT:  Normocephalic and atraumatic.  Neck:  No JVD.  Lungs: Mechanically ventilated breath sounds. Heart: Regular rate and rhythm, normal S1 and S2 without gallops or murmurs.  Abdomen: Distended appearing.  Gas and dark liquid in ostomy, serosanguineous output to RUQ percutaneous drain, midline incision covered with dry dressing Msk: Generalized weakness. Extremities: Warm and well perfused. No clubbing, cyanosis.  Generally edematous upper and lower extremities.  SCDs Neuro: Unable to assess Psych: Unable to assess  Labs: Basic Metabolic Panel: Recent Labs    10/05/22 2045 10/05/22 2207 10/06/22 0110 10/06/22 0457  NA  --    < > 138 139  K  --    < > 4.9 4.8  CL  --    < > 107 107  CO2  --    < > 26 26  GLUCOSE  --    < > 156* 71  BUN  --    < > 35* 35*  CREATININE  --    < > 1.45* 1.52*  CALCIUM  --    < > 8.7* 8.7*  MG 1.7  --   --  1.8  PHOS 3.8  --   --  4.0   < > = values in this interval not displayed.    Liver Function Tests: Recent Labs    10/05/22 2207 10/06/22 0110  AST 21 20  ALT 11 11  ALKPHOS 63 64  BILITOT 0.5 0.5  PROT 6.2* 6.7  ALBUMIN 1.7* 2.2*    No results for input(s): "LIPASE", "AMYLASE" in the last 72 hours. CBC: Recent Labs    10/05/22 0440 10/05/22 1842 10/06/22 0013 10/06/22 0110 10/06/22 0457  WBC  31.9* 29.4*  --   --  31.1*  NEUTROABS 24.6*  --   --   --  22.8*  HGB 7.2* 7.4*   < > 8.8* 8.2*  HCT 23.3* 23.0*   < > 29.1*  27.3*  MCV 77.9* 79.0*  --   --  81.0  PLT 405* 402*  --   --  499*   < > = values in this interval not displayed.    Cardiac Enzymes: Recent Labs    10/05/22 2045 10/05/22 2207 10/06/22 0110  TROPONINIHS 23* 26* 25*    BNP: No results for input(s): "BNP" in the last 72 hours.  D-Dimer: No results for input(s): "DDIMER" in the last 72 hours. Hemoglobin A1C: No results for input(s): "HGBA1C" in the last 72 hours. Fasting Lipid Panel: Recent Labs    10/06/22 0457  TRIG 149    Thyroid Function Tests: No results for input(s): "TSH", "T4TOTAL", "T3FREE", "THYROIDAB" in the last 72 hours.  Invalid input(s): "FREET3"  Anemia Panel: No results for input(s): "VITAMINB12", "FOLATE", "FERRITIN", "TIBC", "IRON", "RETICCTPCT" in the last 72 hours.   Radiology: DG Abd 1 View  Result Date: 10/05/2022 CLINICAL DATA:  Orogastric tube placement EXAM: ABDOMEN - 1 VIEW COMPARISON:  None Available. FINDINGS: Orogastric tube tip overlies expected proximal to mid body of the stomach. Central venous catheter tip noted within the superior vena cava. Pigtail drainage catheter overlies the right hepatic lobe. Surgical clips are seen within the right mid abdomen. Visualized abdominal gas pattern is nonspecific due to a paucity of gas within the upper abdomen. No gross free intraperitoneal gas. IMPRESSION: 1. Orogastric tube tip within the proximal to mid body of the stomach. Electronically Signed   By: Fidela Salisbury M.D.   On: 10/05/2022 20:34   DG Chest 1 View  Result Date: 10/05/2022 CLINICAL DATA:  Intubation EXAM: CHEST  1 VIEW COMPARISON:  Chest x-ray 09/28/2022 FINDINGS: Endotracheal tube tip is 3 cm above the carina. Right upper extremity PICC terminates over the distal SVC. Enteric tube extends below the diaphragm, distal tip is not included on the image. Lung volumes are low. There are patchy opacities in the left mid lung and right lung base similar to prior. No pleural effusion or  pneumothorax. The cardiomediastinal silhouette is stable, the heart is enlarged. The osseous structures are unchanged. IMPRESSION: 1. Endotracheal tube tip is 3 cm above the carina. 2. Stable patchy opacities in the left mid lung and right lung base. Electronically Signed   By: Ronney Asters M.D.   On: 10/05/2022 18:51   CT GUIDED PERITONEAL/RETROPERITONEAL FLUID DRAIN BY PERC CATH  Result Date: 10/05/2022 INDICATION: Intra-abdominal fluid collections after prior right hemicolectomy and cholecystectomy for colon carcinoma and cholelithiasis. Persistent significant leukocytosis. EXAM: 1. CT-GUIDED ASPIRATION OF LEFT ANTERIOR PERITONEAL FLUID COLLECTION 2. CT-GUIDED ASPIRATION OF LEFT POSTEROLATERAL PERITONEAL FLUID COLLECTION 3. CT-GUIDED CATHETER DRAINAGE OF RIGHT PERIHEPATIC PERITONEAL FLUID COLLECTION 4. MEDICATIONS: None ANESTHESIA/SEDATION: Moderate (conscious) sedation was employed during this procedure. A total of Versed 2.0 mg and Fentanyl 100 mcg was administered intravenously by the radiology nurse. Total intra-service moderate Sedation Time: 52 minutes. The patient's level of consciousness and vital signs were monitored continuously by radiology nursing throughout the procedure under my direct supervision. COMPLICATIONS: None immediate. PROCEDURE: Informed written consent was obtained from the patient after a thorough discussion of the procedural risks, benefits and alternatives. All questions were addressed. Maximal Sterile Barrier Technique was utilized including caps, mask, sterile gowns, sterile gloves, sterile drape, hand  hygiene and skin antiseptic. A timeout was performed prior to the initiation of the procedure. CT of the abdomen was performed in a supine position. Localizing scans were performed with grids and sites marked on the skin in the upper right abdomen and mid to lower left abdomen. From a left lateral approach, an 18 gauge trocar needle was advanced into an anterior left-sided  peritoneal fluid collection. After confirming needle tip position, aspiration was performed via the trocar needle utilizing syringes. A sample was sent for culture analysis. The needle was removed after additional CT was performed. A second 18 gauge trocar needle was then advanced from a left-sided approach into a left posterolateral peritoneal fluid collection. Aspiration was performed through the trocar needle. A fluid sample was sent for culture analysis. The needle was removed after additional CT imaging. An 18 gauge trocar needle was advanced in the right upper abdomen into the lateral perihepatic space. After confirming needle tip position and aspiration of fluid, a guidewire was advanced into the fluid collection. The percutaneous tract was dilated over the guidewire and a 10 French percutaneous drainage catheter advanced into the lateral perihepatic space. Catheter position was confirmed by CT. A fluid sample was withdrawn and sent for culture analysis. The drainage catheter was attached to a suction bulb and secured at the skin with a Prolene retention suture and adhesive device. FINDINGS: Aspiration of the elongated left anterior fluid collection immediately deep to the anterior abdominal wall yielded thin, clear yellow fluid. A total volume of 90 mL of fluid was able to be aspirated from this region resulting in complete decompression of the collection by CT after aspiration. Aspiration of the teardrop shaped fluid collection along the lateral paracolic region of the peritoneal cavity yielded darker yellow fluid which was slightly turbid, especially towards the end of aspiration. Aspiration yielded 50 mL of fluid resulting in near complete decompression of the collection by CT after aspiration. Aspiration of the right lateral perihepatic fluid collection yielded thin, clear yellow fluid. Given that this is the largest single area fluid present in the peritoneal cavity currently, it was elected to place a  drainage catheter in this fluid collection. IMPRESSION: 1. CT-guided needle aspiration of left anterior peritoneal fluid collection yielded 90 mL of thin, clear yellow fluid resulting in complete decompression. A fluid sample from this collection was sent for culture analysis. 2. CT-guided needle aspiration left posterolateral paracolic fluid collection yielded 50 mL of slightly turbid yellow fluid resulting in near complete decompression. A fluid sample was sent for culture analysis. 3. CT-guided percutaneous catheter drainage was performed of the perihepatic fluid collection from a right lateral approach yielding thin, clear yellow fluid. A 10 French drainage catheter was placed and attached to suction bulb drainage. Electronically Signed   By: Aletta Edouard M.D.   On: 10/05/2022 16:22   CT GUIDED NEEDLE PLACEMENT  Result Date: 10/05/2022 INDICATION: Intra-abdominal fluid collections after prior right hemicolectomy and cholecystectomy for colon carcinoma and cholelithiasis. Persistent significant leukocytosis. EXAM: 1. CT-GUIDED ASPIRATION OF LEFT ANTERIOR PERITONEAL FLUID COLLECTION 2. CT-GUIDED ASPIRATION OF LEFT POSTEROLATERAL PERITONEAL FLUID COLLECTION 3. CT-GUIDED CATHETER DRAINAGE OF RIGHT PERIHEPATIC PERITONEAL FLUID COLLECTION 4. MEDICATIONS: None ANESTHESIA/SEDATION: Moderate (conscious) sedation was employed during this procedure. A total of Versed 2.0 mg and Fentanyl 100 mcg was administered intravenously by the radiology nurse. Total intra-service moderate Sedation Time: 52 minutes. The patient's level of consciousness and vital signs were monitored continuously by radiology nursing throughout the procedure under my direct supervision. COMPLICATIONS: None  immediate. PROCEDURE: Informed written consent was obtained from the patient after a thorough discussion of the procedural risks, benefits and alternatives. All questions were addressed. Maximal Sterile Barrier Technique was utilized including  caps, mask, sterile gowns, sterile gloves, sterile drape, hand hygiene and skin antiseptic. A timeout was performed prior to the initiation of the procedure. CT of the abdomen was performed in a supine position. Localizing scans were performed with grids and sites marked on the skin in the upper right abdomen and mid to lower left abdomen. From a left lateral approach, an 18 gauge trocar needle was advanced into an anterior left-sided peritoneal fluid collection. After confirming needle tip position, aspiration was performed via the trocar needle utilizing syringes. A sample was sent for culture analysis. The needle was removed after additional CT was performed. A second 18 gauge trocar needle was then advanced from a left-sided approach into a left posterolateral peritoneal fluid collection. Aspiration was performed through the trocar needle. A fluid sample was sent for culture analysis. The needle was removed after additional CT imaging. An 18 gauge trocar needle was advanced in the right upper abdomen into the lateral perihepatic space. After confirming needle tip position and aspiration of fluid, a guidewire was advanced into the fluid collection. The percutaneous tract was dilated over the guidewire and a 10 French percutaneous drainage catheter advanced into the lateral perihepatic space. Catheter position was confirmed by CT. A fluid sample was withdrawn and sent for culture analysis. The drainage catheter was attached to a suction bulb and secured at the skin with a Prolene retention suture and adhesive device. FINDINGS: Aspiration of the elongated left anterior fluid collection immediately deep to the anterior abdominal wall yielded thin, clear yellow fluid. A total volume of 90 mL of fluid was able to be aspirated from this region resulting in complete decompression of the collection by CT after aspiration. Aspiration of the teardrop shaped fluid collection along the lateral paracolic region of the  peritoneal cavity yielded darker yellow fluid which was slightly turbid, especially towards the end of aspiration. Aspiration yielded 50 mL of fluid resulting in near complete decompression of the collection by CT after aspiration. Aspiration of the right lateral perihepatic fluid collection yielded thin, clear yellow fluid. Given that this is the largest single area fluid present in the peritoneal cavity currently, it was elected to place a drainage catheter in this fluid collection. IMPRESSION: 1. CT-guided needle aspiration of left anterior peritoneal fluid collection yielded 90 mL of thin, clear yellow fluid resulting in complete decompression. A fluid sample from this collection was sent for culture analysis. 2. CT-guided needle aspiration left posterolateral paracolic fluid collection yielded 50 mL of slightly turbid yellow fluid resulting in near complete decompression. A fluid sample was sent for culture analysis. 3. CT-guided percutaneous catheter drainage was performed of the perihepatic fluid collection from a right lateral approach yielding thin, clear yellow fluid. A 10 French drainage catheter was placed and attached to suction bulb drainage. Electronically Signed   By: Aletta Edouard M.D.   On: 10/05/2022 16:22   CT GUIDED NEEDLE PLACEMENT  Result Date: 10/05/2022 INDICATION: Intra-abdominal fluid collections after prior right hemicolectomy and cholecystectomy for colon carcinoma and cholelithiasis. Persistent significant leukocytosis. EXAM: 1. CT-GUIDED ASPIRATION OF LEFT ANTERIOR PERITONEAL FLUID COLLECTION 2. CT-GUIDED ASPIRATION OF LEFT POSTEROLATERAL PERITONEAL FLUID COLLECTION 3. CT-GUIDED CATHETER DRAINAGE OF RIGHT PERIHEPATIC PERITONEAL FLUID COLLECTION 4. MEDICATIONS: None ANESTHESIA/SEDATION: Moderate (conscious) sedation was employed during this procedure. A total of Versed 2.0 mg  and Fentanyl 100 mcg was administered intravenously by the radiology nurse. Total intra-service moderate  Sedation Time: 52 minutes. The patient's level of consciousness and vital signs were monitored continuously by radiology nursing throughout the procedure under my direct supervision. COMPLICATIONS: None immediate. PROCEDURE: Informed written consent was obtained from the patient after a thorough discussion of the procedural risks, benefits and alternatives. All questions were addressed. Maximal Sterile Barrier Technique was utilized including caps, mask, sterile gowns, sterile gloves, sterile drape, hand hygiene and skin antiseptic. A timeout was performed prior to the initiation of the procedure. CT of the abdomen was performed in a supine position. Localizing scans were performed with grids and sites marked on the skin in the upper right abdomen and mid to lower left abdomen. From a left lateral approach, an 18 gauge trocar needle was advanced into an anterior left-sided peritoneal fluid collection. After confirming needle tip position, aspiration was performed via the trocar needle utilizing syringes. A sample was sent for culture analysis. The needle was removed after additional CT was performed. A second 18 gauge trocar needle was then advanced from a left-sided approach into a left posterolateral peritoneal fluid collection. Aspiration was performed through the trocar needle. A fluid sample was sent for culture analysis. The needle was removed after additional CT imaging. An 18 gauge trocar needle was advanced in the right upper abdomen into the lateral perihepatic space. After confirming needle tip position and aspiration of fluid, a guidewire was advanced into the fluid collection. The percutaneous tract was dilated over the guidewire and a 10 French percutaneous drainage catheter advanced into the lateral perihepatic space. Catheter position was confirmed by CT. A fluid sample was withdrawn and sent for culture analysis. The drainage catheter was attached to a suction bulb and secured at the skin with a  Prolene retention suture and adhesive device. FINDINGS: Aspiration of the elongated left anterior fluid collection immediately deep to the anterior abdominal wall yielded thin, clear yellow fluid. A total volume of 90 mL of fluid was able to be aspirated from this region resulting in complete decompression of the collection by CT after aspiration. Aspiration of the teardrop shaped fluid collection along the lateral paracolic region of the peritoneal cavity yielded darker yellow fluid which was slightly turbid, especially towards the end of aspiration. Aspiration yielded 50 mL of fluid resulting in near complete decompression of the collection by CT after aspiration. Aspiration of the right lateral perihepatic fluid collection yielded thin, clear yellow fluid. Given that this is the largest single area fluid present in the peritoneal cavity currently, it was elected to place a drainage catheter in this fluid collection. IMPRESSION: 1. CT-guided needle aspiration of left anterior peritoneal fluid collection yielded 90 mL of thin, clear yellow fluid resulting in complete decompression. A fluid sample from this collection was sent for culture analysis. 2. CT-guided needle aspiration left posterolateral paracolic fluid collection yielded 50 mL of slightly turbid yellow fluid resulting in near complete decompression. A fluid sample was sent for culture analysis. 3. CT-guided percutaneous catheter drainage was performed of the perihepatic fluid collection from a right lateral approach yielding thin, clear yellow fluid. A 10 French drainage catheter was placed and attached to suction bulb drainage. Electronically Signed   By: Aletta Edouard M.D.   On: 10/05/2022 16:22   CT ABDOMEN PELVIS WO CONTRAST  Result Date: 10/03/2022 CLINICAL DATA:  Abdominal pain. Postop leukocytosis. Status post right hemicolectomy for colon mass on 09/24/2022. Patient was re-explored 09/28/2022 and found to  have purulent fluid in the right  upper quadrant with possible Peri anastomotic abscess. EXAM: CT ABDOMEN AND PELVIS WITHOUT CONTRAST TECHNIQUE: Multidetector CT imaging of the abdomen and pelvis was performed following the standard protocol without IV contrast. RADIATION DOSE REDUCTION: This exam was performed according to the departmental dose-optimization program which includes automated exposure control, adjustment of the mA and/or kV according to patient size and/or use of iterative reconstruction technique. COMPARISON:  09/28/2022 FINDINGS: Lower chest: Dependent collapse/consolidation is seen in both lungs with small bilateral pleural effusions. Hepatobiliary: No suspicious focal abnormality in the liver on this study without intravenous contrast. Gallbladder surgically absent. No intrahepatic or extrahepatic biliary dilation. Pancreas: No focal mass lesion. No dilatation of the main duct. No intraparenchymal cyst. No peripancreatic edema. Spleen: No splenomegaly. No focal mass lesion. Adrenals/Urinary Tract: No adrenal nodule or mass. Kidneys unremarkable. No evidence for hydroureter. Bladder is decompressed by Foley catheter. Gas in the bladder lumen is compatible with the instrumentation. Stomach/Bowel: Oral contrast material is seen in the stomach. Duodenum is normally positioned as is the ligament of Treitz. No small bowel wall thickening. No small bowel dilatation. Loop ileostomy noted right abdomen. Right hemicolectomy. Transverse and left colon is opacified and decompressed. Surgical drain is identified in the right abdomen adjacent to the ileocolic anastomosis. Vascular/Lymphatic: There is mild atherosclerotic calcification of the abdominal aorta without aneurysm. There is no gastrohepatic or hepatoduodenal ligament lymphadenopathy. No retroperitoneal or mesenteric lymphadenopathy. No pelvic sidewall lymphadenopathy. Reproductive: The prostate gland and seminal vesicles are unremarkable. Other: Small volume free fluid is seen  adjacent to the liver, similar to prior. Scattered collections of fluid are seen in the mesentery and bilateral paracolic gutters, also similar to prior. Volume of intraperitoneal gas has decreased in the interval. 14.7 x 2.0 cm relatively well-defined Jeani Hawking shaped collection of fluid is identified anterior abdomen (image 53/2). Open midline wound evident. Musculoskeletal: No worrisome lytic or sclerotic osseous abnormality. IMPRESSION: 1. Decreasing intraperitoneal free gas since 09/28/2022 as expected. Free fluid around the liver is similar to prior with multiple fluid collections in the mesentery in fluid pooling in the para colic gutters bilaterally. No definite abscess at this time although lack of intravenous contrast lessens sensitivity for detection. Superinfection of any of the intraperitoneal fluid collections cannot be excluded on this study. 2. Right abdominal loop ileostomy in this patient with right hemicolectomy. 3. Dependent collapse/consolidation in both lungs with small bilateral pleural effusions. 4.  Aortic Atherosclerosis (ICD10-I70.0). Electronically Signed   By: Misty Stanley M.D.   On: 10/03/2022 15:48   ECHOCARDIOGRAM COMPLETE  Result Date: 09/29/2022    ECHOCARDIOGRAM REPORT   Patient Name:   Nicolas Zuniga Date of Exam: 09/29/2022 Medical Rec #:  [id number]     Height:       74.0 in Accession #:    [id number]    Weight:       260.1 lb Date of Birth:  [birth year]      BSA:          2.430 m Patient Age:    27 years      BP:           126/74 mmHg Patient Gender: M             HR:           83 bpm. Exam Location:  [facility] Procedure: 2D Echo, Color Doppler and Cardiac Doppler Indications:     Abnormal ECG R94.31  History:  Patient has prior history of Echocardiogram examinations, most                  recent 07/09/2016. Risk Factors:Hypertension and Diabetes.                  Paroxysmal Afib.  Sonographer:     Sherrie Sport Referring Phys:  [id number] Teressa Lower Diagnosing Phys: Serafina Royals  MD  Sonographer Comments: Echo performed with patient supine and on artificial respirator, Technically challenging study due to limited acoustic windows and no subcostal window. IMPRESSIONS  1. Left ventricular ejection fraction, by estimation, is 65 to 70%. The left ventricle has normal function. The left ventricle has no regional wall motion abnormalities. Left ventricular diastolic parameters were normal.  2. Right ventricular systolic function is normal. The right ventricular size is normal.  3. The mitral valve is normal in structure. Trivial mitral valve regurgitation.  4. The aortic valve is normal in structure. Aortic valve regurgitation is not visualized. FINDINGS  Left Ventricle: Left ventricular ejection fraction, by estimation, is 65 to 70%. The left ventricle has normal function. The left ventricle has no regional wall motion abnormalities. The left ventricular internal cavity size was small. There is no left ventricular hypertrophy. Left ventricular diastolic parameters were normal. Right Ventricle: The right ventricular size is normal. No increase in right ventricular wall thickness. Right ventricular systolic function is normal. Left Atrium: Left atrial size was normal in size. Right Atrium: Right atrial size was normal in size. Pericardium: There is no evidence of pericardial effusion. Mitral Valve: The mitral valve is normal in structure. Trivial mitral valve regurgitation. Tricuspid Valve: The tricuspid valve is normal in structure. Tricuspid valve regurgitation is trivial. Aortic Valve: The aortic valve is normal in structure. Aortic valve regurgitation is not visualized. Aortic valve mean gradient measures 2.0 mmHg. Aortic valve peak gradient measures 3.4 mmHg. Aortic valve area, by VTI measures 3.50 cm. Pulmonic Valve: The pulmonic valve was normal in structure. Pulmonic valve regurgitation is not visualized. Aorta: The aortic root and ascending aorta are structurally normal, with no evidence  of dilitation. IAS/Shunts: No atrial level shunt detected by color flow Doppler.  LEFT VENTRICLE PLAX 2D LVIDd:         4.60 cm LVIDs:         3.30 cm LV PW:         1.20 cm LV IVS:        0.80 cm LVOT diam:     2.00 cm LV SV:         43 LV SV Index:   18 LVOT Area:     3.14 cm  RIGHT VENTRICLE RV Basal diam:  4.10 cm RV Mid diam:    3.80 cm RV S prime:     11.40 cm/s TAPSE (M-mode): 2.0 cm LEFT ATRIUM           Index        RIGHT ATRIUM           Index LA diam:      3.10 cm 1.28 cm/m   RA Area:     14.80 cm LA Vol (A2C): 40.0 ml 16.46 ml/m  RA Volume:   37.20 ml  15.31 ml/m LA Vol (A4C): 70.1 ml 28.84 ml/m  AORTIC VALVE AV Area (Vmax):    2.22 cm AV Area (Vmean):   2.37 cm AV Area (VTI):     3.50 cm AV Vmax:  91.60 cm/s AV Vmean:          67.100 cm/s AV VTI:            0.122 m AV Peak Grad:      3.4 mmHg AV Mean Grad:      2.0 mmHg LVOT Vmax:         64.70 cm/s LVOT Vmean:        50.700 cm/s LVOT VTI:          0.136 m LVOT/AV VTI ratio: 1.11  AORTA Ao Root diam: 3.20 cm MITRAL VALVE               TRICUSPID VALVE MV Area (PHT): 3.40 cm    TR Peak grad:   19.4 mmHg MV Decel Time: 223 msec    TR Vmax:        220.00 cm/s MV E velocity: 85.90 cm/s                            SHUNTS                            Systemic VTI:  0.14 m                            Systemic Diam: 2.00 cm Serafina Royals MD Electronically signed by Serafina Royals MD Signature Date/Time: 09/29/2022/12:21:30 PM    Final    DG Abd 1 View  Result Date: 09/28/2022 CLINICAL DATA:  Enteric catheter placement, postop EXAM: ABDOMEN - 1 VIEW COMPARISON:  09/26/2022 FINDINGS: Frontal view of the lower chest and upper abdomen demonstrates enteric catheter passing below diaphragm, tip projecting over the gastric antrum. Left basilar consolidation compatible with atelectasis. Bowel gas pattern is unremarkable. IMPRESSION: 1. Enteric catheter tip projecting over the gastric antrum. Electronically Signed   By: Randa Ngo M.D.   On:  09/28/2022 15:52   DG Chest Port 1 View  Result Date: 09/28/2022 CLINICAL DATA:  Postop, intubated EXAM: PORTABLE CHEST 1 VIEW COMPARISON:  09/27/2022 FINDINGS: Single frontal view of the chest demonstrates endotracheal tube overlying tracheal air column tip at level of thoracic inlet. Enteric catheter passes below diaphragm tip excluded by collimation. Right-sided PICC tip overlies the right atrium. Numerous cardiac leads overlie the central chest. The cardiac silhouette is unremarkable. Lung volumes are diminished, with left basilar consolidation most consistent with atelectasis. Trace left pleural effusion. No pneumothorax. No acute bony abnormalities. IMPRESSION: 1. Support devices as above. 2. Increasing left basilar consolidation, favor atelectasis over pneumonia. 3. Trace left pleural effusion. Electronically Signed   By: Randa Ngo M.D.   On: 09/28/2022 15:51   CT ABDOMEN PELVIS WO CONTRAST  Result Date: 09/28/2022 CLINICAL DATA:  Status post right hemicolectomy 09/24/2022. Anastomotic complication suspected. EXAM: CT ABDOMEN AND PELVIS WITHOUT CONTRAST TECHNIQUE: Multidetector CT imaging of the abdomen and pelvis was performed following the standard protocol without IV contrast. RADIATION DOSE REDUCTION: This exam was performed according to the departmental dose-optimization program which includes automated exposure control, adjustment of the mA and/or kV according to patient size and/or use of iterative reconstruction technique. COMPARISON:  09/27/2022 FINDINGS: Lower chest: Bibasilar atelectasis noted with tiny bilateral pleural effusions. Hepatobiliary: No suspicious focal abnormality in the liver on this study without intravenous contrast. Gallbladder is surgically absent. No intrahepatic or extrahepatic biliary dilation. Pancreas: No focal mass lesion. No dilatation  of the main duct. No intraparenchymal cyst. No peripancreatic edema. Spleen: No splenomegaly. No focal mass lesion.  Adrenals/Urinary Tract: No adrenal nodule or mass. Contrast in the kidneys compatible with residual from yesterday's infuse scan. Small cyst noted left kidney. No evidence for hydroureter. The urinary bladder appears normal for the degree of distention. Stomach/Bowel: Stomach is distended with gas and fluid. Duodenum is normally positioned as is the ligament of Treitz. No small bowel wall thickening. No small bowel dilatation. The distal ileum at the ileocolic anastomosis in the proximal transverse colon at the anastomosis is well opacified with enteric contrast material. While there is no evidence for gross contrast extravasation at the level of the anastomosis, the small fluid collection identified on yesterday's study has increased in attenuation in the interval (26 Hounsfield units previously versus 67 Hounsfield units today). This increase in attenuation is concerning for some trace spill of contrast from the anastomosis into the collection although blood products or infectious debris could cause increase in attenuation. This finding is associated with a slight increase in fluid volume in the right paracolic gutter. Diverticular changes are seen in the left colon as before. Vascular/Lymphatic: There is mild atherosclerotic calcification of the abdominal aorta without aneurysm. There is no gastrohepatic or hepatoduodenal ligament lymphadenopathy. No retroperitoneal or mesenteric lymphadenopathy. No pelvic sidewall lymphadenopathy. Reproductive: The hypoattenuating collection seen previously in the prostate gland are less prominent today. Other: Free fluid and free gas in the peritoneal cavity is again noted. Free gas is not unexpected 4 days out from surgery. As noted above, the volume of fluid in the right paracolic gutter is slightly increased in the interval. Musculoskeletal: Gas in the midline subcutaneous tissues deep to the staple line has increased in the interval No worrisome lytic or sclerotic osseous  abnormality. IMPRESSION: 1. While there is no evidence for gross enteric contrast extravasation at the level of the ileocolic anastomosis, the small fluid collection adjacent to the anastomosis identified on yesterday's study has increased in attenuation in the interval. This increase in attenuation is concerning for some trace spill of contrast from the anastomosis into the collection. 2. Slight increase in fluid volume in the right paracolic gutter with similar fluid volume around the liver and in the left paracolic gutter. Collections of interloop mesenteric fluid are not substantially changed. 3. Free gas in the peritoneal cavity is not unexpected 4 days out from surgery. 4. Gas in the midline subcutaneous tissues deep to the staple line has increased in the interval. 5. Contrast in the kidneys compatible with residual from yesterday's infused scan. 6. Bibasilar atelectasis with tiny bilateral pleural effusions. 7. Aortic Atherosclerosis (ICD10-I70.0). Findings were Dr. Dahlia Byes at approximately 1105 hours on 09/28/2022. Electronically Signed   By: Misty Stanley M.D.   On: 09/28/2022 11:22   DG Chest Port 1 View  Result Date: 09/27/2022 CLINICAL DATA:  PICC line placement. EXAM: PORTABLE CHEST 1 VIEW COMPARISON:  Abdominal series 09/26/2022. CT abdomen and pelvis 09/27/2022 FINDINGS: Shallow inspiration with atelectasis in the lung bases. Cardiac enlargement. No airspace disease or consolidation in the lungs. A right PICC line has been placed with tip over the cavoatrial junction. No pneumothorax. Pneumoperitoneum is demonstrated below the right hemidiaphragm as seen on prior studies, likely postoperative. IMPRESSION: 1. Right PICC line appears in satisfactory position. 2. Shallow inspiration with atelectasis in the lung bases. Cardiac enlargement. 3. Pneumoperitoneum as seen on prior studies, probably postoperative. Electronically Signed   By: Lucienne Capers M.D.   On: 09/27/2022 19:25  [place] EKG SITE  RITE  Result Date: 09/27/2022 If Site Rite image not attached, placement could not be confirmed due to current cardiac rhythm.  CT ABDOMEN PELVIS W CONTRAST  Result Date: 09/27/2022 CLINICAL DATA:  66 year old male with RIGHT colectomy for adenocarcinoma and cholecystectomy performed on 09/24/2022. Hypotension and abdominal discomfort. EXAM: CT ABDOMEN AND PELVIS WITH CONTRAST TECHNIQUE: Multidetector CT imaging of the abdomen and pelvis was performed using the standard protocol following bolus administration of intravenous contrast. RADIATION DOSE REDUCTION: This exam was performed according to the departmental dose-optimization program which includes automated exposure control, adjustment of the mA and/or kV according to patient size and/or use of iterative reconstruction technique. CONTRAST:  156m OMNIPAQUE IOHEXOL 350 MG/ML SOLN COMPARISON:  09/17/2022 CT FINDINGS: Lower chest: Trace bilateral pleural effusions and bibasilar atelectasis noted. Hepatobiliary: The liver is unremarkable. The patient is status post cholecystectomy. There is no evidence of intrahepatic or extrahepatic biliary dilatation. Pancreas: A small amount of ill-defined fluid anterior to the pancreas is noted but the remainder of the pancreas is unremarkable. Spleen: Unremarkable Adrenals/Urinary Tract: No significant abnormalities of the kidneys, adrenal glands or bladder noted. No evidence of hydronephrosis. Stomach/Bowel: RIGHT colectomy and ileo colonic anastomosis noted. There is a small amount of ill-defined high density fluid along the suture line (images 35-39: Series 4), which may represent a small amount of extravasated contrast/leak. There is a small amount of ascites within the abdomen and pelvis with small to moderate amount of pneumoperitoneum within the abdomen. There is no evidence of focal collection/defined abscess. There is no evidence of bowel obstruction. No definite bowel wall thickening is identified.  Vascular/Lymphatic: Aortic atherosclerosis. No enlarged abdominal or pelvic lymph nodes. Reproductive: 2 low-density areas within the prostate gland are noted, measuring 1.7 cm on the LEFT and 1 cm on the RIGHT. Other: Ill-defined stranding within the abdomen and pelvis noted likely recent surgical changes. Musculoskeletal: No acute or suspicious bony abnormalities are identified. IMPRESSION: 1. Ill-defined slightly high density fluid along the ileocolic suture line, which may represent a small amount of extravasated contrast/leak. Small to moderate amount of pneumoperitoneum and small amount of ascites within the abdomen and pelvis, not unexpected given surgery but nonspecific. No evidence of focal collection/defined abscess. 2. Trace bilateral pleural effusions and bibasilar atelectasis. 3. 2 low-density areas within the prostate gland, nonspecific but correlate with PSA. 4.  Aortic Atherosclerosis (ICD10-I70.0). Critical Value/emergent results discussed by phone at the time of interpretation on 09/27/2022 at 1:50 pm to provider DIEGO PABON , who verbally acknowledged these results. Electronically Signed   By: JMargarette CanadaM.D.   On: 09/27/2022 13:51   CT Angio Chest Pulmonary Embolism (PE) W or WO Contrast  Result Date: 09/27/2022 CLINICAL DATA:  Shortness of breath. EXAM: CT ANGIOGRAPHY CHEST WITH CONTRAST TECHNIQUE: Multidetector CT imaging of the chest was performed using the standard protocol during bolus administration of intravenous contrast. Multiplanar CT image reconstructions and MIPs were obtained to evaluate the vascular anatomy. RADIATION DOSE REDUCTION: This exam was performed according to the departmental dose-optimization program which includes automated exposure control, adjustment of the mA and/or kV according to patient size and/or use of iterative reconstruction technique. CONTRAST:  1088mOMNIPAQUE IOHEXOL 350 MG/ML SOLN COMPARISON:  September 17, 2022. FINDINGS: Cardiovascular: Satisfactory  opacification of the pulmonary arteries to the segmental level. No evidence of pulmonary embolism. Normal heart size. No pericardial effusion. Mediastinum/Nodes: No enlarged mediastinal, hilar, or axillary lymph nodes. Thyroid gland, trachea, and esophagus demonstrate no significant findings. Lungs/Pleura: No  pneumothorax is noted. Minimal bilateral pleural effusions are noted with adjacent subsegmental atelectasis. Upper Abdomen: Pneumoperitoneum is noted in the visualized portion of upper abdomen consistent with the reported history of recent laparoscopic right colectomy and cholecystectomy. Musculoskeletal: No chest wall abnormality. No acute or significant osseous findings. Review of the MIP images confirms the above findings. IMPRESSION: No definite evidence of pulmonary embolus. Minimal bilateral pleural effusions are noted with adjacent subsegmental atelectasis. Pneumoperitoneum is noted in visualized portion of upper abdomen consistent with reported history of recent laparoscopic right colectomy and cholecystectomy. Electronically Signed   By: Marijo Conception M.D.   On: 09/27/2022 13:38   DG ABD ACUTE 2+V W 1V CHEST  Result Date: 09/26/2022 CLINICAL DATA:  Ileus following open right colectomy on 09/24/2022 EXAM: DG ABDOMEN ACUTE WITH 1 VIEW CHEST COMPARISON:  09/17/2022 FINDINGS: Gaseous distension of the stomach. Air is seen within the colon. No dilated loops of small bowel. Small-moderate volume pneumoperitoneum. Numerous surgical clips within the right hemiabdomen. Midline abdominal skin staples. Heart size within normal limits. Low lung volumes. Trace left pleural effusion with left basilar atelectasis. No pneumothorax. IMPRESSION: 1. Small-moderate volume pneumoperitoneum, likely related to recent surgery. 2. Gaseous distension of the stomach. No dilated loops of small bowel to suggest obstruction. Electronically Signed   By: Davina Poke D.O.   On: 09/26/2022 10:57   CT Chest W  Contrast  Result Date: 09/18/2022 CLINICAL DATA:  Colon cancer diagnosed on 09/08/2022. Elevated CEA. Staging evaluation. EXAM: CT CHEST, ABDOMEN, AND PELVIS WITH CONTRAST TECHNIQUE: Multidetector CT imaging of the chest, abdomen and pelvis was performed following the standard protocol during bolus administration of intravenous contrast. RADIATION DOSE REDUCTION: This exam was performed according to the departmental dose-optimization program which includes automated exposure control, adjustment of the mA and/or kV according to patient size and/or use of iterative reconstruction technique. CONTRAST:  170m OMNIPAQUE IOHEXOL 300 MG/ML  SOLN COMPARISON:  Abdomen and pelvis CT dated 03/07/2020 FINDINGS: CT CHEST FINDINGS Cardiovascular: Minimal coronary artery calcification. Normal sized heart. Mediastinum/Nodes: No enlarged mediastinal, hilar, or axillary lymph nodes. Thyroid gland, trachea, and esophagus demonstrate no significant findings. Lungs/Pleura: Minimal linear atelectasis or scarring at the left lung base. 5 x 3 mm left lower lobe nodule with a mean diameter of 4 mm on image number 100/5. Taking differences in slice thickness into account, this demonstrates little if any change since 03/07/2020. There is also a small calcified granuloma more superiorly in the left lower lobe on image number 77/5. On image number 97/5 and coronal image number 58/6, there is a 4 x 2 mm oval nodular density with a mean diameter of 3 mm. It is difficult determine if this is a lung nodule or prominent vessel branch point. No other lung nodules seen. No pleural fluid. Musculoskeletal: Thoracic and lower cervical spine degenerative changes. No evidence of bony metastatic disease. CT ABDOMEN PELVIS FINDINGS Hepatobiliary: Multiple small noncalcified gallstones in the dependent portion of the gallbladder measuring up 5 mm in maximum diameter each. No gallbladder wall thickening or pericholecystic fluid. Unremarkable liver. No liver  masses are seen. Pancreas: Unremarkable. No pancreatic ductal dilatation or surrounding inflammatory changes. Spleen: Normal in size without focal abnormality. Adrenals/Urinary Tract: Normal appearing adrenal glands. Small bilateral renal cysts. These do not need imaging follow-up. Poorly distended urinary bladder. No ureteral abnormalities seen. Stomach/Bowel: Interval concentric mass involving the right colon and ileocecal valve measuring 7.3 cm in length on coronal image number 88/6 with a maximum wall thickness of 1.7  cm on the same image. This mass is confluent with an immediately adjacent mass medially with central low density, measuring 3.8 x 3.0 cm on image number 92/3 and 4.1 cm in length on coronal image number 83/6. There are multiple adjacent lymph node medial to these masses. The largest has a short axis diameter of 2.4 cm on image number 86/3. Multiple descending and sigmoid colon diverticula. Mild wall thickening involving the terminal ileum at the ileocecal valve. The remainder of the small bowel is unremarkable. Normal-appearing appendix and stomach. Vascular/Lymphatic: Mild atheromatous arterial calcifications without aneurysm. Multiple enlarged mesenteric lymph nodes medial to the right colon mass, as described above. No other enlarged lymph nodes seen. Reproductive: Prostate is unremarkable. Other: No abdominal wall hernia or abnormality. No abdominopelvic ascites. Musculoskeletal: Lumbar spine degenerative changes. Mild bilateral hip degenerative changes. No evidence of bony metastatic disease. IMPRESSION: 1. Interval concentric mass involving the right colon, ileocecal valve and distal terminal ileum, compatible with the patient's known colon carcinoma. No associated obstruction. 2. Adjacent mass with central low density, compatible with local extension of necrotic tumor or an enlarged, necrotic metastatic lymph node. 3. Multiple adjacent enlarged mesenteric lymph nodes medial to these masses,  compatible with metastatic adenopathy. 4. Colonic diverticulosis. 5. Cholelithiasis. 6. 4 mm mean diameter left lower lobe nodule possible 3 mm mean diameter right middle lobe nodule. These are felt to have a low likelihood of representing metastatic disease and could be followed on subsequent examinations. Electronically Signed   By: Claudie Revering M.D.   On: 09/18/2022 17:04   CT Abdomen Pelvis W Contrast  Result Date: 09/18/2022 CLINICAL DATA:  Colon cancer diagnosed on 09/08/2022. Elevated CEA. Staging evaluation. EXAM: CT CHEST, ABDOMEN, AND PELVIS WITH CONTRAST TECHNIQUE: Multidetector CT imaging of the chest, abdomen and pelvis was performed following the standard protocol during bolus administration of intravenous contrast. RADIATION DOSE REDUCTION: This exam was performed according to the departmental dose-optimization program which includes automated exposure control, adjustment of the mA and/or kV according to patient size and/or use of iterative reconstruction technique. CONTRAST:  184m OMNIPAQUE IOHEXOL 300 MG/ML  SOLN COMPARISON:  Abdomen and pelvis CT dated 03/07/2020 FINDINGS: CT CHEST FINDINGS Cardiovascular: Minimal coronary artery calcification. Normal sized heart. Mediastinum/Nodes: No enlarged mediastinal, hilar, or axillary lymph nodes. Thyroid gland, trachea, and esophagus demonstrate no significant findings. Lungs/Pleura: Minimal linear atelectasis or scarring at the left lung base. 5 x 3 mm left lower lobe nodule with a mean diameter of 4 mm on image number 100/5. Taking differences in slice thickness into account, this demonstrates little if any change since 03/07/2020. There is also a small calcified granuloma more superiorly in the left lower lobe on image number 77/5. On image number 97/5 and coronal image number 58/6, there is a 4 x 2 mm oval nodular density with a mean diameter of 3 mm. It is difficult determine if this is a lung nodule or prominent vessel branch point. No other  lung nodules seen. No pleural fluid. Musculoskeletal: Thoracic and lower cervical spine degenerative changes. No evidence of bony metastatic disease. CT ABDOMEN PELVIS FINDINGS Hepatobiliary: Multiple small noncalcified gallstones in the dependent portion of the gallbladder measuring up 5 mm in maximum diameter each. No gallbladder wall thickening or pericholecystic fluid. Unremarkable liver. No liver masses are seen. Pancreas: Unremarkable. No pancreatic ductal dilatation or surrounding inflammatory changes. Spleen: Normal in size without focal abnormality. Adrenals/Urinary Tract: Normal appearing adrenal glands. Small bilateral renal cysts. These do not need imaging follow-up. Poorly distended  urinary bladder. No ureteral abnormalities seen. Stomach/Bowel: Interval concentric mass involving the right colon and ileocecal valve measuring 7.3 cm in length on coronal image number 88/6 with a maximum wall thickness of 1.7 cm on the same image. This mass is confluent with an immediately adjacent mass medially with central low density, measuring 3.8 x 3.0 cm on image number 92/3 and 4.1 cm in length on coronal image number 83/6. There are multiple adjacent lymph node medial to these masses. The largest has a short axis diameter of 2.4 cm on image number 86/3. Multiple descending and sigmoid colon diverticula. Mild wall thickening involving the terminal ileum at the ileocecal valve. The remainder of the small bowel is unremarkable. Normal-appearing appendix and stomach. Vascular/Lymphatic: Mild atheromatous arterial calcifications without aneurysm. Multiple enlarged mesenteric lymph nodes medial to the right colon mass, as described above. No other enlarged lymph nodes seen. Reproductive: Prostate is unremarkable. Other: No abdominal wall hernia or abnormality. No abdominopelvic ascites. Musculoskeletal: Lumbar spine degenerative changes. Mild bilateral hip degenerative changes. No evidence of bony metastatic disease.  IMPRESSION: 1. Interval concentric mass involving the right colon, ileocecal valve and distal terminal ileum, compatible with the patient's known colon carcinoma. No associated obstruction. 2. Adjacent mass with central low density, compatible with local extension of necrotic tumor or an enlarged, necrotic metastatic lymph node. 3. Multiple adjacent enlarged mesenteric lymph nodes medial to these masses, compatible with metastatic adenopathy. 4. Colonic diverticulosis. 5. Cholelithiasis. 6. 4 mm mean diameter left lower lobe nodule possible 3 mm mean diameter right middle lobe nodule. These are felt to have a low likelihood of representing metastatic disease and could be followed on subsequent examinations. Electronically Signed   By: Claudie Revering M.D.   On: 09/18/2022 17:04    ECHO 04/2022 EF >55%, G1 DD with normal LV and RV systolic function  TELEMETRY reviewed by me (LT) 10/06/2022 : NSR rate 60s to 70s EKG reviewed by me: 09/25/2022 sinus rhythm rate 60s  Data reviewed by me (LT) 10/06/2022: Surgery progress note, hospitalist progress note, critical care progress note, CBC, BMP, I's and O's, vitals, telemetry  Principal Problem:   Adenocarcinoma of colon ([place]) Active Problems:   Diabetes ([place])   Essential hypertension   Paroxysmal A-fib ([facility])   S/P right colectomy   S/P partial resection of colon   Hyponatremia   AKI (acute kidney injury) ([place])   Postoperative intra-abdominal abscess   Persistent atrial fibrillation ([facility])   Septic shock ([place])   Diabetic ketoacidosis without coma associated with type 2 diabetes mellitus ([place])   Generalized anxiety disorder   Acute hypoxic respiratory failure ([place])   Weakness   Microcytic anemia    ASSESSMENT AND PLAN:  Nicolas Zuniga is a 13yoM with a PMH of paroxysmal atrial fibrillation (on flecainide, metoprolol, and Eliquis), HFpEF (EF >55, G1 DD 04/2022), type II diabetes, hypertension, recently diagnosed adenocarcinoma of the colon who presented  to [facility] for and elective laparoscopic right colectomy ultimately converted to open right colectomy and cholecystectomy.  Postoperative course was complicated by hypotension and bradycardia the evening of postop day 1, concerning for beta-blocker toxicity for which he was started on glucagon infusion.  Rapid response was called overnight on 10/21 for atrial fibrillation with RVR and questionable VT, was started on amiodarone and diltiazem infusions with some heart rate response.  On 10/23 he underwent exploratory laparotomy, abdominal washout, and diverting loop ileostomy for intra-abdominal purulent fluid seen on CT abdomen pelvis.  Postoperatively, he remained intubated and  requiring vasopressor support.  He converted to atrial flutter with variable conduction the evening of 10/23, Extubated 10/24. He has remained in AF with acceptable rate control on amiodarone and metoprolol. Heparin infusion paused the AM of 10/30 for bloody ostomy output. Required reintubation 10/30.   #Acute hypoxic & hypercapnic respiratory failure  -reintubated d/t increased wob and hypercapnia on 10/30, transferred to the ICU  #Adenocarcinoma of the colon s/p open R colectomy & CCY 10/19 #S/p exploratory laparotomy, abdominal washout, and diverting loop ileostomy 10/23 #Intra-abdominal abscess with ?Septic shock -Management per primary team and surgery -ID also involved to help guide treatment  #Hypotension  #Paroxysmal atrial fibrillation with RVR, converted to NSR 10/31 Was previously on flecainide, metoprolol, and Eliquis which have all been held after rapid response on 10/21.  His heart rates were difficult to control, and was in A-fib in the 120s at rest with paroxysms to the 150s throughout the day of 10/23 and converted to atrial flutter with controlled ventricular response in the evening.  Bradycardic overnight so diltiazem and Precedex infusions were discontinued on 10/24.  Since 10/25 he has remained in  atrial fibrillation with acceptable rate control in the 90s to low 100s primarily. Required reintubation and sedation 10/30. -Continue to treat causes of increased adrenergic tone including pain, infection, hypoxia -Monitor and replete electrolytes to maintain a K >4, mag >2 -wean vasopressors as tolerated, currently on phenylephrine -S/p amiodarone infusion, continue amiodarone 200 mg daily  -hold low-dose metoprolol tartrate 25 mg twice daily while requiring vasopressor support, restart as able -Revisit ablation on an outpatient basis with Dr. Mylinda Latina. -CHA2DS2-VASc 3 (age, htn, dm2).   -heparin infusion held on 10/30 due to [facility] ostomy output and drift in Hgb, restart heparin as able after Hgb stable  -restart Eliquis 5 mg twice daily at discharge or once no further procedures are planned if okay from a surgical standpoint.  #Chronic HFpEF (EF 65-70% 09/2022, previous >55% 04/2022) Remains net + 3.2 L, required IVF for hypotension earlier in hospital course.  -s/p IV Lasix 40 mg once on 10/30, holding further doses d/t hypotension  #Acute renal failure, resolved Much improved with diuresis.  AKI likely ATN with significant hypotension requiring vasopressor support.   -Nephrology consulted by primary team, appreciate their assistance  #Acute blood loss anemia Hgb drift down overnight on 10/30 from 8.0 to 7.2, maroon ostomy output, s/p 1 unit of PRBCs  This patient's plan of care was discussed and created with Dr. Clayborn Bigness and he is in agreement.  Signed: Tristan Schroeder , PA-C 10/06/2022, 8:42 AM [facility]

## 2022-10-07 DIAGNOSIS — C189 Malignant neoplasm of colon, unspecified: Secondary | ICD-10-CM | POA: Diagnosis not present

## 2022-10-07 DIAGNOSIS — K651 Peritoneal abscess: Secondary | ICD-10-CM | POA: Diagnosis not present

## 2022-10-07 LAB — BASIC METABOLIC PANEL
Anion gap: 4 — ABNORMAL LOW (ref 5–15)
BUN: 34 mg/dL — ABNORMAL HIGH (ref 8–23)
CO2: 28 mmol/L (ref 22–32)
Calcium: 8.8 mg/dL — ABNORMAL LOW (ref 8.9–10.3)
Chloride: 109 mmol/L (ref 98–111)
Creatinine, Ser: 1.43 mg/dL — ABNORMAL HIGH (ref 0.61–1.24)
GFR, Estimated: 54 mL/min — ABNORMAL LOW (ref >60)
Glucose, Bld: 191 mg/dL — ABNORMAL HIGH (ref 70–99)
Potassium: 4.7 mmol/L (ref 3.5–5.1)
Sodium: 141 mmol/L (ref 135–145)

## 2022-10-07 LAB — GLUCOSE, CAPILLARY
Glucose-Capillary: 111 mg/dL — ABNORMAL HIGH (ref 70–99)
Glucose-Capillary: 143 mg/dL — ABNORMAL HIGH (ref 70–99)
Glucose-Capillary: 174 mg/dL — ABNORMAL HIGH (ref 70–99)
Glucose-Capillary: 211 mg/dL — ABNORMAL HIGH (ref 70–99)
Glucose-Capillary: 214 mg/dL — ABNORMAL HIGH (ref 70–99)
Glucose-Capillary: 221 mg/dL — ABNORMAL HIGH (ref 70–99)

## 2022-10-07 LAB — CBC WITH DIFFERENTIAL/PLATELET
Abs Immature Granulocytes: 0.36 10*3/uL — ABNORMAL HIGH (ref 0.00–0.07)
Basophils Absolute: 0.1 10*3/uL (ref 0.0–0.1)
Basophils Relative: 0 %
Eosinophils Absolute: 0 10*3/uL (ref 0.0–0.5)
Eosinophils Relative: 0 %
HCT: 26.6 % — ABNORMAL LOW (ref 39.0–52.0)
Hemoglobin: 8 g/dL — ABNORMAL LOW (ref 13.0–17.0)
Immature Granulocytes: 2 %
Lymphocytes Relative: 7 %
Lymphs Abs: 1.4 10*3/uL (ref 0.7–4.0)
MCH: 23.9 pg — ABNORMAL LOW (ref 26.0–34.0)
MCHC: 30.1 g/dL (ref 30.0–36.0)
MCV: 79.4 fL — ABNORMAL LOW (ref 80.0–100.0)
Monocytes Absolute: 1.6 10*3/uL — ABNORMAL HIGH (ref 0.1–1.0)
Monocytes Relative: 8 %
Neutro Abs: 16.3 10*3/uL — ABNORMAL HIGH (ref 1.7–7.7)
Neutrophils Relative %: 83 %
Platelets: 422 10*3/uL — ABNORMAL HIGH (ref 150–400)
RBC: 3.35 MIL/uL — ABNORMAL LOW (ref 4.22–5.81)
RDW: 17.3 % — ABNORMAL HIGH (ref 11.5–15.5)
WBC: 19.8 10*3/uL — ABNORMAL HIGH (ref 4.0–10.5)
nRBC: 0.2 % (ref 0.0–0.2)

## 2022-10-07 LAB — PHOSPHORUS: Phosphorus: 3.2 mg/dL (ref 2.5–4.6)

## 2022-10-07 LAB — MAGNESIUM: Magnesium: 2 mg/dL (ref 1.7–2.4)

## 2022-10-07 LAB — HEPARIN LEVEL (UNFRACTIONATED): Heparin Unfractionated: <0.1 IU/mL — ABNORMAL LOW (ref 0.30–0.70)

## 2022-10-07 MED ORDER — HEPARIN (PORCINE) 25000 UT/250ML-% IV SOLN
1800.0000 [IU]/h | INTRAVENOUS | Status: DC
  Administered 2022-10-07: 1800 [IU]/h via INTRAVENOUS
  Filled 2022-10-07: qty 250

## 2022-10-07 MED ORDER — METOPROLOL TARTRATE 25 MG PO TABS
25.0000 mg | ORAL_TABLET | Freq: Two times a day (BID) | ORAL | Status: DC
  Administered 2022-10-07 (×2): 25 mg via ORAL
  Filled 2022-10-07 (×2): qty 1

## 2022-10-07 MED ORDER — FUROSEMIDE 10 MG/ML IJ SOLN
20.0000 mg | Freq: Once | INTRAMUSCULAR | Status: AC
  Administered 2022-10-07: 20 mg via INTRAVENOUS
  Filled 2022-10-07: qty 4

## 2022-10-07 NOTE — Progress Notes (Signed)
[name]       Patient ID: Nicolas Zuniga MRN: [id number] DOB/AGE: 09/01/1956 66 y.o.  Admit date: 09/24/2022 Referring Physician Sharion Settler, NP  Primary Physician Dr. Hortencia Pilar ([facility])  Primary Cardiologist Nehemiah Massed Reason for Consultation Af RVR  HPI: Nicolas Zuniga is a 66GEX with a PMH of paroxysmal atrial fibrillation (on flecainide, metoprolol, and Eliquis), HFpEF (EF >55, G1 DD 04/2022), type II diabetes, hypertension, recently diagnosed adenocarcinoma of the colon who presented to [facility] 09/24/2022 for and elective laparoscopic right colectomy ultimately converted to open right colectomy and cholecystectomy.  Postoperative course was complicated by hypotension and bradycardia the evening of postop day 1, concerning for beta-blocker toxicity for which he was started on glucagon infusion.  Rapid response was called overnight on 10/21 for atrial fibrillation with RVR and questionable VT, was started on amiodarone and diltiazem infusions with some heart rate response.  On 10/23 he underwent exploratory laparotomy, abdominal washout, and diverting loop ileostomy for intra-abdominal purulent fluid seen on CT abdomen pelvis.  Postoperatively, he remained intubated and requiring vasopressor support.  He converted to atrial flutter with variable conduction the evening of 10/23, Extubated 10/24. He has remained in AF with acceptable rate control on amiodarone and metoprolol. Heparin infusion paused the AM of 10/30 for bloody ostomy output. Required reintubation 10/30, fortunately extubated to [place] on 10/31.  Converted from AF to NSR the morning of 10/31.  Interval history: -remains in NSR, rate controlled. Taking meds per tube.  -extubated 10/31 to [place].  Off vasopressors entirely.  He is happy to finally feel a little bit better, eager to move around as he is able -no chest pain, shortness of breath, abdominal pain -restarting heparin today, ok with  surgery    Past Medical History:  Diagnosis Date   Adenocarcinoma of colon ([place]) 09/08/2022   Cholelithiasis    Chronic gastritis    Colon polyps    Coronary artery calcification seen on CT scan    Diastolic dysfunction    a.) TTE 04/09/2022: EF >55%, triv TR, G1DD   Diverticulosis    Hypertension    Long term current use of anticoagulant    a.) apixaban   Nephrolithiasis    Paroxysmal A-fib ([place])    a.) CHA2DS2VASc = 3 (age, HTN, T2DM);  b.) s/p DCCV 07/09/2016 (100 J x1), 12/22/2018 (120 J x1), 08/18/2021 (120 J x1), 03/11/2022 (120 J x1), 03/31/2022 (120 J x1); c.) rate/rhythm maintained on oral flecanide + metoprolol succinate; chronically anticoagulated with apixaban   T2DM (type 2 diabetes mellitus) ([place])     Past Surgical History:  Procedure Laterality Date   CARDIOVERSION N/A 12/22/2018   Procedure: CARDIOVERSION (CATH LAB);  Surgeon: Corey Skains, MD;  Location: [facility] ORS;  Service: Cardiovascular;  Laterality: N/A;   CARDIOVERSION N/A 08/18/2021   Procedure: CARDIOVERSION;  Surgeon: Corey Skains, MD;  Location: [facility] ORS;  Service: Cardiovascular;  Laterality: N/A;   CARDIOVERSION N/A 03/11/2022   Procedure: CARDIOVERSION;  Surgeon: Corey Skains, MD;  Location: [facility] ORS;  Service: Cardiovascular;  Laterality: N/A;   CARDIOVERSION N/A 03/31/2022   Procedure: CARDIOVERSION;  Surgeon: Corey Skains, MD;  Location: [facility] ORS;  Service: Cardiovascular;  Laterality: N/A;   CHOLECYSTECTOMY N/A 09/24/2022   Procedure: LAPAROSCOPIC CHOLECYSTECTOMY;  Surgeon: Jules Husbands, MD;  Location: [facility] ORS;  Service: General;  Laterality: N/A;   COLONOSCOPY WITH PROPOFOL N/A 09/08/2022   Procedure: COLONOSCOPY WITH PROPOFOL;  Surgeon: Lucilla Lame, MD;  Location: [facility] ENDOSCOPY;  Service: Endoscopy;  Laterality: N/A;   ELECTROPHYSIOLOGIC STUDY N/A 07/09/2016   Procedure: CARDIOVERSION;  Surgeon: Dionisio David, MD;  Location: [facility] ORS;  Service: Cardiovascular;   Laterality: N/A;   LAPAROSCOPIC RIGHT COLECTOMY N/A 09/24/2022   Procedure: LAPAROSCOPIC RIGHT COLECTOMY, RNFA to assist;  Surgeon: Jules Husbands, MD;  Location: [facility] ORS;  Service: General;  Laterality: N/A;   LAPAROTOMY N/A 09/28/2022   Procedure: EXPLORATORY LAPAROTOMY WITH OSTOMY CREATION;  Surgeon: Jules Husbands, MD;  Location: [facility] OR[facility];  Service: General;  Laterality: N/A;   TEE WITHOUT CARDIOVERSION N/A 07/09/2016   Procedure: TRANSESOPHAGEAL ECHOCARDIOGRAM (TEE);  Surgeon: Dionisio David, MD;  Location: [facility] OR[facility];  Service: Cardiovascular;  Laterality: N/A;   web fingers repaired     as a child    Medications Prior to Admission  Medication Sig Dispense Refill Last Dose   bisacodyl (DULCOLAX) 5 MG EC tablet Take all 4 tablets at 8 am the morning prior to your surgery. 4 tablet 0 09/23/2022   Cholecalciferol (VITAMIN D) 50 MCG (2000 UT) CAPS Take 2,000 Units by mouth daily.   Past Week   flecainide (TAMBOCOR) 100 MG tablet Take 100 mg by mouth 2 (two) times daily.   09/24/2022   glipiZIDE (GLUCOTROL XL) 10 MG 24 hr tablet Take 10 mg by mouth 2 (two) times daily.    09/24/2022   metFORMIN (GLUCOPHAGE-XR) 500 MG 24 hr tablet Take 500 mg by mouth daily with breakfast.   Past Month   metoprolol (LOPRESSOR) 100 MG tablet Take 100 mg by mouth 2 (two) times daily.   09/24/2022   metroNIDAZOLE (FLAGYL) 500 MG tablet Take 2 tablets at 8AM, take 2 tablets at [facility], and take 2 tablets at 8PM the day prior to your surgery 6 tablet 0 09/23/2022   Multiple Vitamins-Minerals (MULTIVITAMIN WITH MINERALS) tablet Take 1 tablet by mouth daily with breakfast.    Past Week   neomycin (MYCIFRADIN) 500 MG tablet Take 2 tablet at 8am, take 2 tablets at 2pm, and take 2 tablets at 8pm the day prior to your surgery 6 tablet 0 09/23/2022   Omega-3 Fatty Acids (FISH OIL) 1000 MG CAPS Take 1,000 mg by mouth daily.   Past Week   OZEMPIC, 1 MG/DOSE, 4 MG/3ML SOPN Inject 1 mg into the skin every Wednesday. Patient stopped  taking these medications   Past Month   polyethylene glycol powder (MIRALAX) 17 GM/SCOOP powder Mix full container in 64 ounces of Gatorade or other clear liquid. NO Red 238 g 0 09/23/2022   Turmeric 400 MG CAPS Take 400 mg by mouth daily.   Past Week   vitamin B-12 (CYANOCOBALAMIN) 100 MCG tablet Take 500 mcg by mouth daily.   Past Week   Vitamin E 400 units TABS Take 400 Units by mouth daily.   Past Week   apixaban (ELIQUIS) 5 MG TABS tablet Take 5 mg by mouth 2 (two) times daily.   09/21/2022   Coenzyme Q10 (COQ10) 100 MG CAPS Take 100 mg by mouth daily.   09/21/2022   Social History   Socioeconomic History   Marital status: Married    Spouse name: Not on file   Number of children: Not on file   Years of education: Not on file   Highest education level: Not on file  Occupational History   Not on file  Tobacco Use   Smoking status: Never    Passive exposure: Never   Smokeless tobacco: Never  Vaping Use   Vaping Use: Never used  Substance and Sexual Activity   Alcohol use: No   Drug use: No   Sexual activity: Not on file  Other Topics Concern   Not on file  Social History Narrative   Lives with wife, [name], with on pet, cat.   Social Determinants of Health   Financial Resource Strain: Not on file  Food Insecurity: No Food Insecurity (09/24/2022)   Hunger Vital Sign    Worried About Running Out of Food in the Last Year: Never true    Ran Out of Food in the Last Year: Never true  Transportation Needs: No Transportation Needs (09/24/2022)   PRAPARE - Hydrologist (Medical): No    Lack of Transportation (Non-Medical): No  Physical Activity: Not on file  Stress: Not on file  Social Connections: Not on file  Intimate Partner Violence: Not At Risk (09/24/2022)   Humiliation, Afraid, Rape, and Kick questionnaire    Fear of Current or Ex-Partner: No    Emotionally Abused: No    Physically Abused: No    Sexually Abused: No    Family History   Problem Relation Age of Onset   Varicose Veins Mother    Heart disease Mother    Diabetes Mother    COPD Father      Vitals:   10/07/22 0700 10/07/22 0800 10/07/22 0900 10/07/22 1023  BP: (!) 145/60 (!) 155/65 (!) 162/77 133/63  Pulse: 74 76 81 80  Resp: (!) 24 (!) 22 (!) 26 16  Temp:  98.4 F (36.9 C)  98.2 F (36.8 C)  TempSrc:  Oral  Oral  SpO2: 100% 98% 96% 97%  Weight:      Height:        PHYSICAL EXAM General: acutely ill-appearing middle-aged Caucasian male, sitting at incline in hospital bed, no family at bedside HEENT:  Normocephalic, right nare with crusted scabbing Neck:  No JVD.  Lungs: Normal work of breathing on oxygen by nasal cannula.  Decreased breath sounds without appreciable crackles or wheezes Heart: Regular rate and rhythm, normal S1 and S2 without gallops or murmurs.  Abdomen: Distended appearing.  Gas and dark liquid in ostomy. Msk: Generalized weakness. Extremities: Warm and well perfused. No clubbing, cyanosis.  Generally edematous upper and lower extremities.  SCDs Neuro: Alert and oriented x3 Psych: Flat affect.  Answers questions appropriately  Labs: Basic Metabolic Panel: Recent Labs    10/06/22 0457 10/07/22 0510  NA 139 141  K 4.8 4.7  CL 107 109  CO2 26 28  GLUCOSE 71 191*  BUN 35* 34*  CREATININE 1.52* 1.43*  CALCIUM 8.7* 8.8*  MG 1.8 2.0  PHOS 4.0 3.2    Liver Function Tests: Recent Labs    10/05/22 2207 10/06/22 0110  AST 21 20  ALT 11 11  ALKPHOS 63 64  BILITOT 0.5 0.5  PROT 6.2* 6.7  ALBUMIN 1.7* 2.2*    No results for input(s): "LIPASE", "AMYLASE" in the last 72 hours. CBC: Recent Labs    10/06/22 0457 10/06/22 1135 10/07/22 0510  WBC 31.1*  --  19.8*  NEUTROABS 22.8*  --  16.3*  HGB 8.2* 8.0* 8.0*  HCT 27.3* 25.7* 26.6*  MCV 81.0  --  79.4*  PLT 499*  --  422*    Cardiac Enzymes: Recent Labs    10/05/22 2045 10/05/22 2207 10/06/22 0110  TROPONINIHS 23* 26* 25*    BNP: No results for  input(s): "  BNP" in the last 72 hours.  D-Dimer: No results for input(s): "DDIMER" in the last 72 hours. Hemoglobin A1C: No results for input(s): "HGBA1C" in the last 72 hours. Fasting Lipid Panel: Recent Labs    10/06/22 0457  TRIG 149    Thyroid Function Tests: No results for input(s): "TSH", "T4TOTAL", "T3FREE", "THYROIDAB" in the last 72 hours.  Invalid input(s): "FREET3"  Anemia Panel: No results for input(s): "VITAMINB12", "FOLATE", "FERRITIN", "TIBC", "IRON", "RETICCTPCT" in the last 72 hours.   Radiology: DG Abd 1 View  Result Date: 10/06/2022 CLINICAL DATA:  Nasogastric tube placement. EXAM: ABDOMEN - 1 VIEW COMPARISON:  10/05/2022 FINDINGS: The previously demonstrated nasogastric tube has been replaced with a feeding tube with its tip in the proximal stomach. Normal bowel gas pattern with small amount of contrast in the stomach and bowel. Lumbar and thoracic spine degenerative changes. Mildly progressive left basilar atelectasis. IMPRESSION: 1. Feeding tube tip in the proximal stomach. 2. Mildly progressive left basilar atelectasis. Electronically Signed   By: Claudie Revering M.D.   On: 10/06/2022 12:54   DG Abd 1 View  Result Date: 10/05/2022 CLINICAL DATA:  Orogastric tube placement EXAM: ABDOMEN - 1 VIEW COMPARISON:  None Available. FINDINGS: Orogastric tube tip overlies expected proximal to mid body of the stomach. Central venous catheter tip noted within the superior vena cava. Pigtail drainage catheter overlies the right hepatic lobe. Surgical clips are seen within the right mid abdomen. Visualized abdominal gas pattern is nonspecific due to a paucity of gas within the upper abdomen. No gross free intraperitoneal gas. IMPRESSION: 1. Orogastric tube tip within the proximal to mid body of the stomach. Electronically Signed   By: Fidela Salisbury M.D.   On: 10/05/2022 20:34   DG Chest 1 View  Result Date: 10/05/2022 CLINICAL DATA:  Intubation EXAM: CHEST  1 VIEW COMPARISON:   Chest x-ray 09/28/2022 FINDINGS: Endotracheal tube tip is 3 cm above the carina. Right upper extremity PICC terminates over the distal SVC. Enteric tube extends below the diaphragm, distal tip is not included on the image. Lung volumes are low. There are patchy opacities in the left mid lung and right lung base similar to prior. No pleural effusion or pneumothorax. The cardiomediastinal silhouette is stable, the heart is enlarged. The osseous structures are unchanged. IMPRESSION: 1. Endotracheal tube tip is 3 cm above the carina. 2. Stable patchy opacities in the left mid lung and right lung base. Electronically Signed   By: Ronney Asters M.D.   On: 10/05/2022 18:51   CT GUIDED PERITONEAL/RETROPERITONEAL FLUID DRAIN BY PERC CATH  Result Date: 10/05/2022 INDICATION: Intra-abdominal fluid collections after prior right hemicolectomy and cholecystectomy for colon carcinoma and cholelithiasis. Persistent significant leukocytosis. EXAM: 1. CT-GUIDED ASPIRATION OF LEFT ANTERIOR PERITONEAL FLUID COLLECTION 2. CT-GUIDED ASPIRATION OF LEFT POSTEROLATERAL PERITONEAL FLUID COLLECTION 3. CT-GUIDED CATHETER DRAINAGE OF RIGHT PERIHEPATIC PERITONEAL FLUID COLLECTION 4. MEDICATIONS: None ANESTHESIA/SEDATION: Moderate (conscious) sedation was employed during this procedure. A total of Versed 2.0 mg and Fentanyl 100 mcg was administered intravenously by the radiology nurse. Total intra-service moderate Sedation Time: 52 minutes. The patient's level of consciousness and vital signs were monitored continuously by radiology nursing throughout the procedure under my direct supervision. COMPLICATIONS: None immediate. PROCEDURE: Informed written consent was obtained from the patient after a thorough discussion of the procedural risks, benefits and alternatives. All questions were addressed. Maximal Sterile Barrier Technique was utilized including caps, mask, sterile gowns, sterile gloves, sterile drape, hand hygiene and skin  antiseptic. A timeout  was performed prior to the initiation of the procedure. CT of the abdomen was performed in a supine position. Localizing scans were performed with grids and sites marked on the skin in the upper right abdomen and mid to lower left abdomen. From a left lateral approach, an 18 gauge trocar needle was advanced into an anterior left-sided peritoneal fluid collection. After confirming needle tip position, aspiration was performed via the trocar needle utilizing syringes. A sample was sent for culture analysis. The needle was removed after additional CT was performed. A second 18 gauge trocar needle was then advanced from a left-sided approach into a left posterolateral peritoneal fluid collection. Aspiration was performed through the trocar needle. A fluid sample was sent for culture analysis. The needle was removed after additional CT imaging. An 18 gauge trocar needle was advanced in the right upper abdomen into the lateral perihepatic space. After confirming needle tip position and aspiration of fluid, a guidewire was advanced into the fluid collection. The percutaneous tract was dilated over the guidewire and a 10 French percutaneous drainage catheter advanced into the lateral perihepatic space. Catheter position was confirmed by CT. A fluid sample was withdrawn and sent for culture analysis. The drainage catheter was attached to a suction bulb and secured at the skin with a Prolene retention suture and adhesive device. FINDINGS: Aspiration of the elongated left anterior fluid collection immediately deep to the anterior abdominal wall yielded thin, clear yellow fluid. A total volume of 90 mL of fluid was able to be aspirated from this region resulting in complete decompression of the collection by CT after aspiration. Aspiration of the teardrop shaped fluid collection along the lateral paracolic region of the peritoneal cavity yielded darker yellow fluid which was slightly turbid, especially  towards the end of aspiration. Aspiration yielded 50 mL of fluid resulting in near complete decompression of the collection by CT after aspiration. Aspiration of the right lateral perihepatic fluid collection yielded thin, clear yellow fluid. Given that this is the largest single area fluid present in the peritoneal cavity currently, it was elected to place a drainage catheter in this fluid collection. IMPRESSION: 1. CT-guided needle aspiration of left anterior peritoneal fluid collection yielded 90 mL of thin, clear yellow fluid resulting in complete decompression. A fluid sample from this collection was sent for culture analysis. 2. CT-guided needle aspiration left posterolateral paracolic fluid collection yielded 50 mL of slightly turbid yellow fluid resulting in near complete decompression. A fluid sample was sent for culture analysis. 3. CT-guided percutaneous catheter drainage was performed of the perihepatic fluid collection from a right lateral approach yielding thin, clear yellow fluid. A 10 French drainage catheter was placed and attached to suction bulb drainage. Electronically Signed   By: Aletta Edouard M.D.   On: 10/05/2022 16:22   CT GUIDED NEEDLE PLACEMENT  Result Date: 10/05/2022 INDICATION: Intra-abdominal fluid collections after prior right hemicolectomy and cholecystectomy for colon carcinoma and cholelithiasis. Persistent significant leukocytosis. EXAM: 1. CT-GUIDED ASPIRATION OF LEFT ANTERIOR PERITONEAL FLUID COLLECTION 2. CT-GUIDED ASPIRATION OF LEFT POSTEROLATERAL PERITONEAL FLUID COLLECTION 3. CT-GUIDED CATHETER DRAINAGE OF RIGHT PERIHEPATIC PERITONEAL FLUID COLLECTION 4. MEDICATIONS: None ANESTHESIA/SEDATION: Moderate (conscious) sedation was employed during this procedure. A total of Versed 2.0 mg and Fentanyl 100 mcg was administered intravenously by the radiology nurse. Total intra-service moderate Sedation Time: 52 minutes. The patient's level of consciousness and vital signs were  monitored continuously by radiology nursing throughout the procedure under my direct supervision. COMPLICATIONS: None immediate. PROCEDURE: Informed written consent was  obtained from the patient after a thorough discussion of the procedural risks, benefits and alternatives. All questions were addressed. Maximal Sterile Barrier Technique was utilized including caps, mask, sterile gowns, sterile gloves, sterile drape, hand hygiene and skin antiseptic. A timeout was performed prior to the initiation of the procedure. CT of the abdomen was performed in a supine position. Localizing scans were performed with grids and sites marked on the skin in the upper right abdomen and mid to lower left abdomen. From a left lateral approach, an 18 gauge trocar needle was advanced into an anterior left-sided peritoneal fluid collection. After confirming needle tip position, aspiration was performed via the trocar needle utilizing syringes. A sample was sent for culture analysis. The needle was removed after additional CT was performed. A second 18 gauge trocar needle was then advanced from a left-sided approach into a left posterolateral peritoneal fluid collection. Aspiration was performed through the trocar needle. A fluid sample was sent for culture analysis. The needle was removed after additional CT imaging. An 18 gauge trocar needle was advanced in the right upper abdomen into the lateral perihepatic space. After confirming needle tip position and aspiration of fluid, a guidewire was advanced into the fluid collection. The percutaneous tract was dilated over the guidewire and a 10 French percutaneous drainage catheter advanced into the lateral perihepatic space. Catheter position was confirmed by CT. A fluid sample was withdrawn and sent for culture analysis. The drainage catheter was attached to a suction bulb and secured at the skin with a Prolene retention suture and adhesive device. FINDINGS: Aspiration of the elongated left  anterior fluid collection immediately deep to the anterior abdominal wall yielded thin, clear yellow fluid. A total volume of 90 mL of fluid was able to be aspirated from this region resulting in complete decompression of the collection by CT after aspiration. Aspiration of the teardrop shaped fluid collection along the lateral paracolic region of the peritoneal cavity yielded darker yellow fluid which was slightly turbid, especially towards the end of aspiration. Aspiration yielded 50 mL of fluid resulting in near complete decompression of the collection by CT after aspiration. Aspiration of the right lateral perihepatic fluid collection yielded thin, clear yellow fluid. Given that this is the largest single area fluid present in the peritoneal cavity currently, it was elected to place a drainage catheter in this fluid collection. IMPRESSION: 1. CT-guided needle aspiration of left anterior peritoneal fluid collection yielded 90 mL of thin, clear yellow fluid resulting in complete decompression. A fluid sample from this collection was sent for culture analysis. 2. CT-guided needle aspiration left posterolateral paracolic fluid collection yielded 50 mL of slightly turbid yellow fluid resulting in near complete decompression. A fluid sample was sent for culture analysis. 3. CT-guided percutaneous catheter drainage was performed of the perihepatic fluid collection from a right lateral approach yielding thin, clear yellow fluid. A 10 French drainage catheter was placed and attached to suction bulb drainage. Electronically Signed   By: Aletta Edouard M.D.   On: 10/05/2022 16:22   CT GUIDED NEEDLE PLACEMENT  Result Date: 10/05/2022 INDICATION: Intra-abdominal fluid collections after prior right hemicolectomy and cholecystectomy for colon carcinoma and cholelithiasis. Persistent significant leukocytosis. EXAM: 1. CT-GUIDED ASPIRATION OF LEFT ANTERIOR PERITONEAL FLUID COLLECTION 2. CT-GUIDED ASPIRATION OF LEFT  POSTEROLATERAL PERITONEAL FLUID COLLECTION 3. CT-GUIDED CATHETER DRAINAGE OF RIGHT PERIHEPATIC PERITONEAL FLUID COLLECTION 4. MEDICATIONS: None ANESTHESIA/SEDATION: Moderate (conscious) sedation was employed during this procedure. A total of Versed 2.0 mg and Fentanyl 100 mcg was administered  intravenously by the radiology nurse. Total intra-service moderate Sedation Time: 52 minutes. The patient's level of consciousness and vital signs were monitored continuously by radiology nursing throughout the procedure under my direct supervision. COMPLICATIONS: None immediate. PROCEDURE: Informed written consent was obtained from the patient after a thorough discussion of the procedural risks, benefits and alternatives. All questions were addressed. Maximal Sterile Barrier Technique was utilized including caps, mask, sterile gowns, sterile gloves, sterile drape, hand hygiene and skin antiseptic. A timeout was performed prior to the initiation of the procedure. CT of the abdomen was performed in a supine position. Localizing scans were performed with grids and sites marked on the skin in the upper right abdomen and mid to lower left abdomen. From a left lateral approach, an 18 gauge trocar needle was advanced into an anterior left-sided peritoneal fluid collection. After confirming needle tip position, aspiration was performed via the trocar needle utilizing syringes. A sample was sent for culture analysis. The needle was removed after additional CT was performed. A second 18 gauge trocar needle was then advanced from a left-sided approach into a left posterolateral peritoneal fluid collection. Aspiration was performed through the trocar needle. A fluid sample was sent for culture analysis. The needle was removed after additional CT imaging. An 18 gauge trocar needle was advanced in the right upper abdomen into the lateral perihepatic space. After confirming needle tip position and aspiration of fluid, a guidewire was  advanced into the fluid collection. The percutaneous tract was dilated over the guidewire and a 10 French percutaneous drainage catheter advanced into the lateral perihepatic space. Catheter position was confirmed by CT. A fluid sample was withdrawn and sent for culture analysis. The drainage catheter was attached to a suction bulb and secured at the skin with a Prolene retention suture and adhesive device. FINDINGS: Aspiration of the elongated left anterior fluid collection immediately deep to the anterior abdominal wall yielded thin, clear yellow fluid. A total volume of 90 mL of fluid was able to be aspirated from this region resulting in complete decompression of the collection by CT after aspiration. Aspiration of the teardrop shaped fluid collection along the lateral paracolic region of the peritoneal cavity yielded darker yellow fluid which was slightly turbid, especially towards the end of aspiration. Aspiration yielded 50 mL of fluid resulting in near complete decompression of the collection by CT after aspiration. Aspiration of the right lateral perihepatic fluid collection yielded thin, clear yellow fluid. Given that this is the largest single area fluid present in the peritoneal cavity currently, it was elected to place a drainage catheter in this fluid collection. IMPRESSION: 1. CT-guided needle aspiration of left anterior peritoneal fluid collection yielded 90 mL of thin, clear yellow fluid resulting in complete decompression. A fluid sample from this collection was sent for culture analysis. 2. CT-guided needle aspiration left posterolateral paracolic fluid collection yielded 50 mL of slightly turbid yellow fluid resulting in near complete decompression. A fluid sample was sent for culture analysis. 3. CT-guided percutaneous catheter drainage was performed of the perihepatic fluid collection from a right lateral approach yielding thin, clear yellow fluid. A 10 French drainage catheter was placed and  attached to suction bulb drainage. Electronically Signed   By: Aletta Edouard M.D.   On: 10/05/2022 16:22   CT ABDOMEN PELVIS WO CONTRAST  Result Date: 10/03/2022 CLINICAL DATA:  Abdominal pain. Postop leukocytosis. Status post right hemicolectomy for colon mass on 09/24/2022. Patient was re-explored 09/28/2022 and found to have purulent fluid in the right  upper quadrant with possible Peri anastomotic abscess. EXAM: CT ABDOMEN AND PELVIS WITHOUT CONTRAST TECHNIQUE: Multidetector CT imaging of the abdomen and pelvis was performed following the standard protocol without IV contrast. RADIATION DOSE REDUCTION: This exam was performed according to the departmental dose-optimization program which includes automated exposure control, adjustment of the mA and/or kV according to patient size and/or use of iterative reconstruction technique. COMPARISON:  09/28/2022 FINDINGS: Lower chest: Dependent collapse/consolidation is seen in both lungs with small bilateral pleural effusions. Hepatobiliary: No suspicious focal abnormality in the liver on this study without intravenous contrast. Gallbladder surgically absent. No intrahepatic or extrahepatic biliary dilation. Pancreas: No focal mass lesion. No dilatation of the main duct. No intraparenchymal cyst. No peripancreatic edema. Spleen: No splenomegaly. No focal mass lesion. Adrenals/Urinary Tract: No adrenal nodule or mass. Kidneys unremarkable. No evidence for hydroureter. Bladder is decompressed by Foley catheter. Gas in the bladder lumen is compatible with the instrumentation. Stomach/Bowel: Oral contrast material is seen in the stomach. Duodenum is normally positioned as is the ligament of Treitz. No small bowel wall thickening. No small bowel dilatation. Loop ileostomy noted right abdomen. Right hemicolectomy. Transverse and left colon is opacified and decompressed. Surgical drain is identified in the right abdomen adjacent to the ileocolic anastomosis.  Vascular/Lymphatic: There is mild atherosclerotic calcification of the abdominal aorta without aneurysm. There is no gastrohepatic or hepatoduodenal ligament lymphadenopathy. No retroperitoneal or mesenteric lymphadenopathy. No pelvic sidewall lymphadenopathy. Reproductive: The prostate gland and seminal vesicles are unremarkable. Other: Small volume free fluid is seen adjacent to the liver, similar to prior. Scattered collections of fluid are seen in the mesentery and bilateral paracolic gutters, also similar to prior. Volume of intraperitoneal gas has decreased in the interval. 14.7 x 2.0 cm relatively well-defined Jeani Hawking shaped collection of fluid is identified anterior abdomen (image 53/2). Open midline wound evident. Musculoskeletal: No worrisome lytic or sclerotic osseous abnormality. IMPRESSION: 1. Decreasing intraperitoneal free gas since 09/28/2022 as expected. Free fluid around the liver is similar to prior with multiple fluid collections in the mesentery in fluid pooling in the para colic gutters bilaterally. No definite abscess at this time although lack of intravenous contrast lessens sensitivity for detection. Superinfection of any of the intraperitoneal fluid collections cannot be excluded on this study. 2. Right abdominal loop ileostomy in this patient with right hemicolectomy. 3. Dependent collapse/consolidation in both lungs with small bilateral pleural effusions. 4.  Aortic Atherosclerosis (ICD10-I70.0). Electronically Signed   By: Misty Stanley M.D.   On: 10/03/2022 15:48   ECHOCARDIOGRAM COMPLETE  Result Date: 09/29/2022    ECHOCARDIOGRAM REPORT   Patient Name:   Nicolas Zuniga Date of Exam: 09/29/2022 Medical Rec #:  [id number]     Height:       74.0 in Accession #:    [id number]    Weight:       260.1 lb Date of Birth:  [birth year]      BSA:          2.430 m Patient Age:    8 years      BP:           126/74 mmHg Patient Gender: M             HR:           83 bpm. Exam Location:  [facility]  Procedure: 2D Echo, Color Doppler and Cardiac Doppler Indications:     Abnormal ECG R94.31  History:         Patient has prior  history of Echocardiogram examinations, most                  recent 07/09/2016. Risk Factors:Hypertension and Diabetes.                  Paroxysmal Afib.  Sonographer:     Sherrie Sport Referring Phys:  [id number] Teressa Lower Diagnosing Phys: Serafina Royals MD  Sonographer Comments: Echo performed with patient supine and on artificial respirator, Technically challenging study due to limited acoustic windows and no subcostal window. IMPRESSIONS  1. Left ventricular ejection fraction, by estimation, is 65 to 70%. The left ventricle has normal function. The left ventricle has no regional wall motion abnormalities. Left ventricular diastolic parameters were normal.  2. Right ventricular systolic function is normal. The right ventricular size is normal.  3. The mitral valve is normal in structure. Trivial mitral valve regurgitation.  4. The aortic valve is normal in structure. Aortic valve regurgitation is not visualized. FINDINGS  Left Ventricle: Left ventricular ejection fraction, by estimation, is 65 to 70%. The left ventricle has normal function. The left ventricle has no regional wall motion abnormalities. The left ventricular internal cavity size was small. There is no left ventricular hypertrophy. Left ventricular diastolic parameters were normal. Right Ventricle: The right ventricular size is normal. No increase in right ventricular wall thickness. Right ventricular systolic function is normal. Left Atrium: Left atrial size was normal in size. Right Atrium: Right atrial size was normal in size. Pericardium: There is no evidence of pericardial effusion. Mitral Valve: The mitral valve is normal in structure. Trivial mitral valve regurgitation. Tricuspid Valve: The tricuspid valve is normal in structure. Tricuspid valve regurgitation is trivial. Aortic Valve: The aortic valve is normal in  structure. Aortic valve regurgitation is not visualized. Aortic valve mean gradient measures 2.0 mmHg. Aortic valve peak gradient measures 3.4 mmHg. Aortic valve area, by VTI measures 3.50 cm. Pulmonic Valve: The pulmonic valve was normal in structure. Pulmonic valve regurgitation is not visualized. Aorta: The aortic root and ascending aorta are structurally normal, with no evidence of dilitation. IAS/Shunts: No atrial level shunt detected by color flow Doppler.  LEFT VENTRICLE PLAX 2D LVIDd:         4.60 cm LVIDs:         3.30 cm LV PW:         1.20 cm LV IVS:        0.80 cm LVOT diam:     2.00 cm LV SV:         43 LV SV Index:   18 LVOT Area:     3.14 cm  RIGHT VENTRICLE RV Basal diam:  4.10 cm RV Mid diam:    3.80 cm RV S prime:     11.40 cm/s TAPSE (M-mode): 2.0 cm LEFT ATRIUM           Index        RIGHT ATRIUM           Index LA diam:      3.10 cm 1.28 cm/m   RA Area:     14.80 cm LA Vol (A2C): 40.0 ml 16.46 ml/m  RA Volume:   37.20 ml  15.31 ml/m LA Vol (A4C): 70.1 ml 28.84 ml/m  AORTIC VALVE AV Area (Vmax):    2.22 cm AV Area (Vmean):   2.37 cm AV Area (VTI):     3.50 cm AV Vmax:           91.60 cm/s  AV Vmean:          67.100 cm/s AV VTI:            0.122 m AV Peak Grad:      3.4 mmHg AV Mean Grad:      2.0 mmHg LVOT Vmax:         64.70 cm/s LVOT Vmean:        50.700 cm/s LVOT VTI:          0.136 m LVOT/AV VTI ratio: 1.11  AORTA Ao Root diam: 3.20 cm MITRAL VALVE               TRICUSPID VALVE MV Area (PHT): 3.40 cm    TR Peak grad:   19.4 mmHg MV Decel Time: 223 msec    TR Vmax:        220.00 cm/s MV E velocity: 85.90 cm/s                            SHUNTS                            Systemic VTI:  0.14 m                            Systemic Diam: 2.00 cm Serafina Royals MD Electronically signed by Serafina Royals MD Signature Date/Time: 09/29/2022/12:21:30 PM    Final    DG Abd 1 View  Result Date: 09/28/2022 CLINICAL DATA:  Enteric catheter placement, postop EXAM: ABDOMEN - 1 VIEW COMPARISON:   09/26/2022 FINDINGS: Frontal view of the lower chest and upper abdomen demonstrates enteric catheter passing below diaphragm, tip projecting over the gastric antrum. Left basilar consolidation compatible with atelectasis. Bowel gas pattern is unremarkable. IMPRESSION: 1. Enteric catheter tip projecting over the gastric antrum. Electronically Signed   By: Randa Ngo M.D.   On: 09/28/2022 15:52   DG Chest Port 1 View  Result Date: 09/28/2022 CLINICAL DATA:  Postop, intubated EXAM: PORTABLE CHEST 1 VIEW COMPARISON:  09/27/2022 FINDINGS: Single frontal view of the chest demonstrates endotracheal tube overlying tracheal air column tip at level of thoracic inlet. Enteric catheter passes below diaphragm tip excluded by collimation. Right-sided PICC tip overlies the right atrium. Numerous cardiac leads overlie the central chest. The cardiac silhouette is unremarkable. Lung volumes are diminished, with left basilar consolidation most consistent with atelectasis. Trace left pleural effusion. No pneumothorax. No acute bony abnormalities. IMPRESSION: 1. Support devices as above. 2. Increasing left basilar consolidation, favor atelectasis over pneumonia. 3. Trace left pleural effusion. Electronically Signed   By: Randa Ngo M.D.   On: 09/28/2022 15:51   CT ABDOMEN PELVIS WO CONTRAST  Result Date: 09/28/2022 CLINICAL DATA:  Status post right hemicolectomy 09/24/2022. Anastomotic complication suspected. EXAM: CT ABDOMEN AND PELVIS WITHOUT CONTRAST TECHNIQUE: Multidetector CT imaging of the abdomen and pelvis was performed following the standard protocol without IV contrast. RADIATION DOSE REDUCTION: This exam was performed according to the departmental dose-optimization program which includes automated exposure control, adjustment of the mA and/or kV according to patient size and/or use of iterative reconstruction technique. COMPARISON:  09/27/2022 FINDINGS: Lower chest: Bibasilar atelectasis noted with tiny  bilateral pleural effusions. Hepatobiliary: No suspicious focal abnormality in the liver on this study without intravenous contrast. Gallbladder is surgically absent. No intrahepatic or extrahepatic biliary dilation. Pancreas: No focal mass lesion. No dilatation of  the main duct. No intraparenchymal cyst. No peripancreatic edema. Spleen: No splenomegaly. No focal mass lesion. Adrenals/Urinary Tract: No adrenal nodule or mass. Contrast in the kidneys compatible with residual from yesterday's infuse scan. Small cyst noted left kidney. No evidence for hydroureter. The urinary bladder appears normal for the degree of distention. Stomach/Bowel: Stomach is distended with gas and fluid. Duodenum is normally positioned as is the ligament of Treitz. No small bowel wall thickening. No small bowel dilatation. The distal ileum at the ileocolic anastomosis in the proximal transverse colon at the anastomosis is well opacified with enteric contrast material. While there is no evidence for gross contrast extravasation at the level of the anastomosis, the small fluid collection identified on yesterday's study has increased in attenuation in the interval (26 Hounsfield units previously versus 67 Hounsfield units today). This increase in attenuation is concerning for some trace spill of contrast from the anastomosis into the collection although blood products or infectious debris could cause increase in attenuation. This finding is associated with a slight increase in fluid volume in the right paracolic gutter. Diverticular changes are seen in the left colon as before. Vascular/Lymphatic: There is mild atherosclerotic calcification of the abdominal aorta without aneurysm. There is no gastrohepatic or hepatoduodenal ligament lymphadenopathy. No retroperitoneal or mesenteric lymphadenopathy. No pelvic sidewall lymphadenopathy. Reproductive: The hypoattenuating collection seen previously in the prostate gland are less prominent today.  Other: Free fluid and free gas in the peritoneal cavity is again noted. Free gas is not unexpected 4 days out from surgery. As noted above, the volume of fluid in the right paracolic gutter is slightly increased in the interval. Musculoskeletal: Gas in the midline subcutaneous tissues deep to the staple line has increased in the interval No worrisome lytic or sclerotic osseous abnormality. IMPRESSION: 1. While there is no evidence for gross enteric contrast extravasation at the level of the ileocolic anastomosis, the small fluid collection adjacent to the anastomosis identified on yesterday's study has increased in attenuation in the interval. This increase in attenuation is concerning for some trace spill of contrast from the anastomosis into the collection. 2. Slight increase in fluid volume in the right paracolic gutter with similar fluid volume around the liver and in the left paracolic gutter. Collections of interloop mesenteric fluid are not substantially changed. 3. Free gas in the peritoneal cavity is not unexpected 4 days out from surgery. 4. Gas in the midline subcutaneous tissues deep to the staple line has increased in the interval. 5. Contrast in the kidneys compatible with residual from yesterday's infused scan. 6. Bibasilar atelectasis with tiny bilateral pleural effusions. 7. Aortic Atherosclerosis (ICD10-I70.0). Findings were Dr. Dahlia Byes at approximately 1105 hours on 09/28/2022. Electronically Signed   By: Misty Stanley M.D.   On: 09/28/2022 11:22   DG Chest Port 1 View  Result Date: 09/27/2022 CLINICAL DATA:  PICC line placement. EXAM: PORTABLE CHEST 1 VIEW COMPARISON:  Abdominal series 09/26/2022. CT abdomen and pelvis 09/27/2022 FINDINGS: Shallow inspiration with atelectasis in the lung bases. Cardiac enlargement. No airspace disease or consolidation in the lungs. A right PICC line has been placed with tip over the cavoatrial junction. No pneumothorax. Pneumoperitoneum is demonstrated below  the right hemidiaphragm as seen on prior studies, likely postoperative. IMPRESSION: 1. Right PICC line appears in satisfactory position. 2. Shallow inspiration with atelectasis in the lung bases. Cardiac enlargement. 3. Pneumoperitoneum as seen on prior studies, probably postoperative. Electronically Signed   By: Lucienne Capers M.D.   On: 09/27/2022 19:25  [place] EKG SITE RITE  Result Date: 09/27/2022 If Site Rite image not attached, placement could not be confirmed due to current cardiac rhythm.  CT ABDOMEN PELVIS W CONTRAST  Result Date: 09/27/2022 CLINICAL DATA:  67 year old male with RIGHT colectomy for adenocarcinoma and cholecystectomy performed on 09/24/2022. Hypotension and abdominal discomfort. EXAM: CT ABDOMEN AND PELVIS WITH CONTRAST TECHNIQUE: Multidetector CT imaging of the abdomen and pelvis was performed using the standard protocol following bolus administration of intravenous contrast. RADIATION DOSE REDUCTION: This exam was performed according to the departmental dose-optimization program which includes automated exposure control, adjustment of the mA and/or kV according to patient size and/or use of iterative reconstruction technique. CONTRAST:  140m OMNIPAQUE IOHEXOL 350 MG/ML SOLN COMPARISON:  09/17/2022 CT FINDINGS: Lower chest: Trace bilateral pleural effusions and bibasilar atelectasis noted. Hepatobiliary: The liver is unremarkable. The patient is status post cholecystectomy. There is no evidence of intrahepatic or extrahepatic biliary dilatation. Pancreas: A small amount of ill-defined fluid anterior to the pancreas is noted but the remainder of the pancreas is unremarkable. Spleen: Unremarkable Adrenals/Urinary Tract: No significant abnormalities of the kidneys, adrenal glands or bladder noted. No evidence of hydronephrosis. Stomach/Bowel: RIGHT colectomy and ileo colonic anastomosis noted. There is a small amount of ill-defined high density fluid along the suture line (images  35-39: Series 4), which may represent a small amount of extravasated contrast/leak. There is a small amount of ascites within the abdomen and pelvis with small to moderate amount of pneumoperitoneum within the abdomen. There is no evidence of focal collection/defined abscess. There is no evidence of bowel obstruction. No definite bowel wall thickening is identified. Vascular/Lymphatic: Aortic atherosclerosis. No enlarged abdominal or pelvic lymph nodes. Reproductive: 2 low-density areas within the prostate gland are noted, measuring 1.7 cm on the LEFT and 1 cm on the RIGHT. Other: Ill-defined stranding within the abdomen and pelvis noted likely recent surgical changes. Musculoskeletal: No acute or suspicious bony abnormalities are identified. IMPRESSION: 1. Ill-defined slightly high density fluid along the ileocolic suture line, which may represent a small amount of extravasated contrast/leak. Small to moderate amount of pneumoperitoneum and small amount of ascites within the abdomen and pelvis, not unexpected given surgery but nonspecific. No evidence of focal collection/defined abscess. 2. Trace bilateral pleural effusions and bibasilar atelectasis. 3. 2 low-density areas within the prostate gland, nonspecific but correlate with PSA. 4.  Aortic Atherosclerosis (ICD10-I70.0). Critical Value/emergent results discussed by phone at the time of interpretation on 09/27/2022 at 1:50 pm to provider DIEGO PABON , who verbally acknowledged these results. Electronically Signed   By: JMargarette CanadaM.D.   On: 09/27/2022 13:51   CT Angio Chest Pulmonary Embolism (PE) W or WO Contrast  Result Date: 09/27/2022 CLINICAL DATA:  Shortness of breath. EXAM: CT ANGIOGRAPHY CHEST WITH CONTRAST TECHNIQUE: Multidetector CT imaging of the chest was performed using the standard protocol during bolus administration of intravenous contrast. Multiplanar CT image reconstructions and MIPs were obtained to evaluate the vascular anatomy.  RADIATION DOSE REDUCTION: This exam was performed according to the departmental dose-optimization program which includes automated exposure control, adjustment of the mA and/or kV according to patient size and/or use of iterative reconstruction technique. CONTRAST:  1062mOMNIPAQUE IOHEXOL 350 MG/ML SOLN COMPARISON:  September 17, 2022. FINDINGS: Cardiovascular: Satisfactory opacification of the pulmonary arteries to the segmental level. No evidence of pulmonary embolism. Normal heart size. No pericardial effusion. Mediastinum/Nodes: No enlarged mediastinal, hilar, or axillary lymph nodes. Thyroid gland, trachea, and esophagus demonstrate no significant findings. Lungs/Pleura: No pneumothorax  is noted. Minimal bilateral pleural effusions are noted with adjacent subsegmental atelectasis. Upper Abdomen: Pneumoperitoneum is noted in the visualized portion of upper abdomen consistent with the reported history of recent laparoscopic right colectomy and cholecystectomy. Musculoskeletal: No chest wall abnormality. No acute or significant osseous findings. Review of the MIP images confirms the above findings. IMPRESSION: No definite evidence of pulmonary embolus. Minimal bilateral pleural effusions are noted with adjacent subsegmental atelectasis. Pneumoperitoneum is noted in visualized portion of upper abdomen consistent with reported history of recent laparoscopic right colectomy and cholecystectomy. Electronically Signed   By: Marijo Conception M.D.   On: 09/27/2022 13:38   DG ABD ACUTE 2+V W 1V CHEST  Result Date: 09/26/2022 CLINICAL DATA:  Ileus following open right colectomy on 09/24/2022 EXAM: DG ABDOMEN ACUTE WITH 1 VIEW CHEST COMPARISON:  09/17/2022 FINDINGS: Gaseous distension of the stomach. Air is seen within the colon. No dilated loops of small bowel. Small-moderate volume pneumoperitoneum. Numerous surgical clips within the right hemiabdomen. Midline abdominal skin staples. Heart size within normal limits.  Low lung volumes. Trace left pleural effusion with left basilar atelectasis. No pneumothorax. IMPRESSION: 1. Small-moderate volume pneumoperitoneum, likely related to recent surgery. 2. Gaseous distension of the stomach. No dilated loops of small bowel to suggest obstruction. Electronically Signed   By: Davina Poke D.O.   On: 09/26/2022 10:57   CT Chest W Contrast  Result Date: 09/18/2022 CLINICAL DATA:  Colon cancer diagnosed on 09/08/2022. Elevated CEA. Staging evaluation. EXAM: CT CHEST, ABDOMEN, AND PELVIS WITH CONTRAST TECHNIQUE: Multidetector CT imaging of the chest, abdomen and pelvis was performed following the standard protocol during bolus administration of intravenous contrast. RADIATION DOSE REDUCTION: This exam was performed according to the departmental dose-optimization program which includes automated exposure control, adjustment of the mA and/or kV according to patient size and/or use of iterative reconstruction technique. CONTRAST:  155m OMNIPAQUE IOHEXOL 300 MG/ML  SOLN COMPARISON:  Abdomen and pelvis CT dated 03/07/2020 FINDINGS: CT CHEST FINDINGS Cardiovascular: Minimal coronary artery calcification. Normal sized heart. Mediastinum/Nodes: No enlarged mediastinal, hilar, or axillary lymph nodes. Thyroid gland, trachea, and esophagus demonstrate no significant findings. Lungs/Pleura: Minimal linear atelectasis or scarring at the left lung base. 5 x 3 mm left lower lobe nodule with a mean diameter of 4 mm on image number 100/5. Taking differences in slice thickness into account, this demonstrates little if any change since 03/07/2020. There is also a small calcified granuloma more superiorly in the left lower lobe on image number 77/5. On image number 97/5 and coronal image number 58/6, there is a 4 x 2 mm oval nodular density with a mean diameter of 3 mm. It is difficult determine if this is a lung nodule or prominent vessel branch point. No other lung nodules seen. No pleural fluid.  Musculoskeletal: Thoracic and lower cervical spine degenerative changes. No evidence of bony metastatic disease. CT ABDOMEN PELVIS FINDINGS Hepatobiliary: Multiple small noncalcified gallstones in the dependent portion of the gallbladder measuring up 5 mm in maximum diameter each. No gallbladder wall thickening or pericholecystic fluid. Unremarkable liver. No liver masses are seen. Pancreas: Unremarkable. No pancreatic ductal dilatation or surrounding inflammatory changes. Spleen: Normal in size without focal abnormality. Adrenals/Urinary Tract: Normal appearing adrenal glands. Small bilateral renal cysts. These do not need imaging follow-up. Poorly distended urinary bladder. No ureteral abnormalities seen. Stomach/Bowel: Interval concentric mass involving the right colon and ileocecal valve measuring 7.3 cm in length on coronal image number 88/6 with a maximum wall thickness of 1.7 cm  on the same image. This mass is confluent with an immediately adjacent mass medially with central low density, measuring 3.8 x 3.0 cm on image number 92/3 and 4.1 cm in length on coronal image number 83/6. There are multiple adjacent lymph node medial to these masses. The largest has a short axis diameter of 2.4 cm on image number 86/3. Multiple descending and sigmoid colon diverticula. Mild wall thickening involving the terminal ileum at the ileocecal valve. The remainder of the small bowel is unremarkable. Normal-appearing appendix and stomach. Vascular/Lymphatic: Mild atheromatous arterial calcifications without aneurysm. Multiple enlarged mesenteric lymph nodes medial to the right colon mass, as described above. No other enlarged lymph nodes seen. Reproductive: Prostate is unremarkable. Other: No abdominal wall hernia or abnormality. No abdominopelvic ascites. Musculoskeletal: Lumbar spine degenerative changes. Mild bilateral hip degenerative changes. No evidence of bony metastatic disease. IMPRESSION: 1. Interval concentric mass  involving the right colon, ileocecal valve and distal terminal ileum, compatible with the patient's known colon carcinoma. No associated obstruction. 2. Adjacent mass with central low density, compatible with local extension of necrotic tumor or an enlarged, necrotic metastatic lymph node. 3. Multiple adjacent enlarged mesenteric lymph nodes medial to these masses, compatible with metastatic adenopathy. 4. Colonic diverticulosis. 5. Cholelithiasis. 6. 4 mm mean diameter left lower lobe nodule possible 3 mm mean diameter right middle lobe nodule. These are felt to have a low likelihood of representing metastatic disease and could be followed on subsequent examinations. Electronically Signed   By: Claudie Revering M.D.   On: 09/18/2022 17:04   CT Abdomen Pelvis W Contrast  Result Date: 09/18/2022 CLINICAL DATA:  Colon cancer diagnosed on 09/08/2022. Elevated CEA. Staging evaluation. EXAM: CT CHEST, ABDOMEN, AND PELVIS WITH CONTRAST TECHNIQUE: Multidetector CT imaging of the chest, abdomen and pelvis was performed following the standard protocol during bolus administration of intravenous contrast. RADIATION DOSE REDUCTION: This exam was performed according to the departmental dose-optimization program which includes automated exposure control, adjustment of the mA and/or kV according to patient size and/or use of iterative reconstruction technique. CONTRAST:  176m OMNIPAQUE IOHEXOL 300 MG/ML  SOLN COMPARISON:  Abdomen and pelvis CT dated 03/07/2020 FINDINGS: CT CHEST FINDINGS Cardiovascular: Minimal coronary artery calcification. Normal sized heart. Mediastinum/Nodes: No enlarged mediastinal, hilar, or axillary lymph nodes. Thyroid gland, trachea, and esophagus demonstrate no significant findings. Lungs/Pleura: Minimal linear atelectasis or scarring at the left lung base. 5 x 3 mm left lower lobe nodule with a mean diameter of 4 mm on image number 100/5. Taking differences in slice thickness into account, this  demonstrates little if any change since 03/07/2020. There is also a small calcified granuloma more superiorly in the left lower lobe on image number 77/5. On image number 97/5 and coronal image number 58/6, there is a 4 x 2 mm oval nodular density with a mean diameter of 3 mm. It is difficult determine if this is a lung nodule or prominent vessel branch point. No other lung nodules seen. No pleural fluid. Musculoskeletal: Thoracic and lower cervical spine degenerative changes. No evidence of bony metastatic disease. CT ABDOMEN PELVIS FINDINGS Hepatobiliary: Multiple small noncalcified gallstones in the dependent portion of the gallbladder measuring up 5 mm in maximum diameter each. No gallbladder wall thickening or pericholecystic fluid. Unremarkable liver. No liver masses are seen. Pancreas: Unremarkable. No pancreatic ductal dilatation or surrounding inflammatory changes. Spleen: Normal in size without focal abnormality. Adrenals/Urinary Tract: Normal appearing adrenal glands. Small bilateral renal cysts. These do not need imaging follow-up. Poorly distended urinary  bladder. No ureteral abnormalities seen. Stomach/Bowel: Interval concentric mass involving the right colon and ileocecal valve measuring 7.3 cm in length on coronal image number 88/6 with a maximum wall thickness of 1.7 cm on the same image. This mass is confluent with an immediately adjacent mass medially with central low density, measuring 3.8 x 3.0 cm on image number 92/3 and 4.1 cm in length on coronal image number 83/6. There are multiple adjacent lymph node medial to these masses. The largest has a short axis diameter of 2.4 cm on image number 86/3. Multiple descending and sigmoid colon diverticula. Mild wall thickening involving the terminal ileum at the ileocecal valve. The remainder of the small bowel is unremarkable. Normal-appearing appendix and stomach. Vascular/Lymphatic: Mild atheromatous arterial calcifications without aneurysm. Multiple  enlarged mesenteric lymph nodes medial to the right colon mass, as described above. No other enlarged lymph nodes seen. Reproductive: Prostate is unremarkable. Other: No abdominal wall hernia or abnormality. No abdominopelvic ascites. Musculoskeletal: Lumbar spine degenerative changes. Mild bilateral hip degenerative changes. No evidence of bony metastatic disease. IMPRESSION: 1. Interval concentric mass involving the right colon, ileocecal valve and distal terminal ileum, compatible with the patient's known colon carcinoma. No associated obstruction. 2. Adjacent mass with central low density, compatible with local extension of necrotic tumor or an enlarged, necrotic metastatic lymph node. 3. Multiple adjacent enlarged mesenteric lymph nodes medial to these masses, compatible with metastatic adenopathy. 4. Colonic diverticulosis. 5. Cholelithiasis. 6. 4 mm mean diameter left lower lobe nodule possible 3 mm mean diameter right middle lobe nodule. These are felt to have a low likelihood of representing metastatic disease and could be followed on subsequent examinations. Electronically Signed   By: Claudie Revering M.D.   On: 09/18/2022 17:04    ECHO 04/2022 EF >55%, G1 DD with normal LV and RV systolic function  TELEMETRY reviewed by me (LT) 10/07/2022 : NSR rate 70s  EKG reviewed by me: 09/25/2022 sinus rhythm rate 60s  Data reviewed by me (LT) 10/07/2022: Surgery progress note, hospitalist progress note, critical care progress note, CBC, BMP, I's and O's, vitals, telemetry  Principal Problem:   Adenocarcinoma of colon ([place]) Active Problems:   Diabetes ([place])   Essential hypertension   Paroxysmal A-fib (HCC)   S/P right colectomy   S/P partial resection of colon   Hyponatremia   AKI (acute kidney injury) ([place])   Postoperative intra-abdominal abscess   Persistent atrial fibrillation (HCC)   Septic shock (HCC)   Diabetic ketoacidosis without coma associated with type 2 diabetes mellitus (HCC)    Generalized anxiety disorder   Acute hypoxic respiratory failure (HCC)   Weakness   Microcytic anemia   Intra-abdominal abscess ([place])    ASSESSMENT AND PLAN:  Nicolas Zuniga is a 29yoM with a PMH of paroxysmal atrial fibrillation (on flecainide, metoprolol, and Eliquis), HFpEF (EF >55, G1 DD 04/2022), type II diabetes, hypertension, recently diagnosed adenocarcinoma of the colon who presented to [facility] 09/24/2022 for and elective laparoscopic right colectomy ultimately converted to open right colectomy and cholecystectomy.  Postoperative course was complicated by hypotension and bradycardia the evening of postop day 1, concerning for beta-blocker toxicity for which he was started on glucagon infusion.  Rapid response was called overnight on 10/21 for atrial fibrillation with RVR and questionable VT, was started on amiodarone and diltiazem infusions with some heart rate response.  On 10/23 he underwent exploratory laparotomy, abdominal washout, and diverting loop ileostomy for intra-abdominal purulent fluid seen on CT abdomen pelvis.  Postoperatively, he  remained intubated and requiring vasopressor support.  He converted to atrial flutter with variable conduction the evening of 10/23, Extubated 10/24. He has remained in AF with acceptable rate control on amiodarone and metoprolol. Heparin infusion paused the AM of 10/30 for bloody ostomy output. Required reintubation 10/30.   #Acute hypoxic & hypercapnic respiratory failure  -reintubated d/t increased wob and hypercapnia on 10/30, transferred to the ICU, extubated to nasal cannula 10/31.  #Adenocarcinoma of the colon s/p open R colectomy & CCY 10/19 #S/p exploratory laparotomy, abdominal washout, and diverting loop ileostomy 10/23 #Intra-abdominal abscess with ?Septic shock -Management per primary team and surgery -ID also involved to help guide treatment  #Hypotension, resolved #Paroxysmal atrial fibrillation with RVR, converted to NSR 10/31 Was  previously on flecainide, metoprolol, and Eliquis which have all been held after rapid response on 10/21.  His heart rates were difficult to control, and was in A-fib in the 120s at rest with paroxysms to the 150s throughout the day of 10/23 and converted to atrial flutter with controlled ventricular response in the evening.  Bradycardic overnight so diltiazem and Precedex infusions were discontinued on 10/24.  Since 10/25 he has remained in atrial fibrillation with acceptable rate control in the 90s to low 100s primarily. Required reintubation and sedation 10/30, fortunately extubated and off vasopressor support 10/31 -Continue to treat causes of increased adrenergic tone including pain, infection, hypoxia -Monitor and replete electrolytes to maintain a K >4, mag >2 -wean vasopressors as tolerated, currently on phenylephrine -S/p amiodarone infusion, continue amiodarone 200 mg daily  -Restart metoprolol tartrate 25 mg twice daily today -Revisit ablation on an outpatient basis with Dr. Mylinda Latina. -CHA2DS2-VASc 3 (age, htn, dm2).   -Restarting heparin infusion today, okay by surgery -restart Eliquis 5 mg twice daily at discharge or once no further procedures are planned if okay from a surgical standpoint.  #Chronic HFpEF (EF 65-70% 09/2022, previous >55% 04/2022) Net negative ~3.9 L today, appears somewhat volume up.  -s/p IV Lasix 40 mg once on 10/30, consider further Lasix dosing  #Acute renal failure, resolved Much improved with diuresis.  AKI likely ATN with significant hypotension requiring vasopressor support.   -Nephrology consulted by primary team, appreciate their assistance  #Acute blood loss anemia Hgb drift down overnight on 10/30 from 8.0 to 6.1, maroon ostomy output, s/p 1 unit of PRBCs.  -Hgb stable at 8 today, continue to check daily  This patient's plan of care was discussed and created with Dr. Clayborn Bigness and he is in agreement.  Signed: Tristan Schroeder ,  PA-C 10/07/2022, 11:37 AM [facility]

## 2022-10-07 NOTE — Progress Notes (Signed)
[place] Hospital Day(s): [place] op day(s): 9 Days Post-Op.   Interval History:  Patient seen and examined Extubated yesterday (10/31) Off vasopressors Patient this morning reports he is feeling better; breathing improved; no significant pain His leukocytosis is improved significantly; now 19.8K Hgb to 8.0 Renal function stable; sCr - 1.43; UO - 485 ccs No electrolyte derangements  Surgical drain with minimal output; serous Percutaneous drain (RUQ); 240 ccs; serous Cx x3 from (10/30) are without growth He continues on Meropenem and micafungin; ID on-board He continues to have ostomy function; 705 ccs recorded  On enteric feedings  Vital signs in last 24 hours: [min-max] current  Temp:  [97.6 F (36.4 C)-99.5 F (37.5 C)] 98.6 F (37 C) (11/01 0400) Pulse Rate:  [74-94] 74 (11/01 0700) Resp:  [19-31] 24 (11/01 0700) BP: (105-171)/(50-90) 145/60 (11/01 0700) SpO2:  [93 %-100 %] 100 % (11/01 0700) FiO2 (%):  [35 %] 35 % (10/31 0949) Weight:  [126.5 kg] 126.5 kg (11/01 0433)     Height: '6\' 2"'$  (188 cm) Weight: 126.5 kg BMI (Calculated): 35.79   Intake/Output last 2 shifts:  10/31 0701 - 11/01 0700 In: 1219 [I.V.:219; NG/GT:382.5; IV Piggyback:517.5] Out: 1430 [Urine:485; Drains:240; Stool:705]   Physical Exam:  Constitutional: Alert, awake, NAD Respiratory: Breathing non-labor, no respiratory distress, on [place] Walk Cardiac: Appears regular rate and rhythm on monitor  Gastrointestinal: Soft, non-distended, no rebound/guarding. Surgical drain with serosanguinous output. 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 Newly placed drain in RUQ; serous fluid Integumentary: Midline laparotomy healing via secondary intention, no erythema, no drainage, no evidence of fistula  Labs:     Latest Ref Rng & Units 10/07/2022    5:10 AM 10/06/2022   11:35 AM 10/06/2022    4:57 AM  CBC  WBC 4.0 - 10.5  K/uL 19.8   31.1   Hemoglobin 13.0 - 17.0 g/dL 8.0  8.0  8.2   Hematocrit 39.0 - 52.0 % 26.6  25.7  27.3   Platelets 150 - 400 K/uL 422   499       Latest Ref Rng & Units 10/07/2022    5:10 AM 10/06/2022    4:57 AM 10/06/2022    1:10 AM  CMP  Glucose 70 - 99 mg/dL 191  71  156   BUN 8 - 23 mg/dL 34  35  35   Creatinine 0.61 - 1.24 mg/dL 1.43  1.52  1.45   Sodium 135 - 145 mmol/L 141  139  138   Potassium 3.5 - 5.1 mmol/L 4.7  4.8  4.9   Chloride 98 - 111 mmol/L 109  107  107   CO2 22 - 32 mmol/L '28  26  26   '$ Calcium 8.9 - 10.3 mg/dL 8.8  8.7  8.7   Total Protein 6.5 - 8.1 g/dL   6.7   Total Bilirubin 0.3 - 1.2 mg/dL   0.5   Alkaline Phos 38 - 126 U/L   64   AST 15 - 41 U/L   20   ALT 0 - 44 U/L   11     Imaging studies: No new pertinent imaging studies   Assessment/Plan:  66 y.o. male now extubated and off vasopressor support, doing well, 9 Days Post-Op s/p exploratory laparotomy, abdominal washout, and diverting loop ileostomy for intra-abdominal purulent fluid after initial open right colectomy with open cholecystectomy for right colon cancer and  cholelithiasis on 10/19    - I think it is reasonable to initiate diet; CLD  - Continue enteric feeding for now; monitor tolerance. Will DC once able to advance diet  - Monitor leukocytosis; marked improvement             - Continue IV Abx (Meropenem); now also on Micafungin; ID following  - Follow up 10/30 Cx; no growth to date             - Monitor abdominal examination; on-going ileostomy function             - Midline wound care: Pack daily with saline moistened gauze, cover, secure             - Continue surgical drain; monitor and record output             - Pain control prn; antiemetics prn             - Appreciate cardiology assistance as well   - Therapies on board; okay to mobilize  - Okay for transfer to floor   All of the above findings and recommendations were discussed with the medical team  -- Edison Simon,  PA-C [facility] 10/07/2022, 8:51 AM M-F: 7am - 4pm

## 2022-10-07 NOTE — Evaluation (Signed)
Occupational Therapy Re-Evaluation Patient Details Name: Nicolas Zuniga MRN: [id number] DOB: 09-Oct-1956 Today's Date: 10/07/2022   History of Present Illness 66 y.o. male Post-Op s/p exploratory laparotomy, abdominal washout, and diverting loop ileostomy for intra-abdominal purulent fluid after initial open right colectomy with open cholecystectomy for right colon cancer and cholelithiasis on 10/19, RUQ drain placed recently then intubated. Now extubated 10/31   Clinical Impression   Mr Tenbrink was seen for OT/PT co re-evaluation on this date. Upon arrival to room pt reclined in bed, agreeable to tx. Pt requires MOD A exit bed. MIN A x2 + RW sit<>stand from elevated bed height and bed>chair steps. Left in chair with all needs met, returned later to assist back to bed MOD A standing from low chair height. Goals updated and d/c recommendation of AIR remains appropriate.      Recommendations for follow up therapy are one component of a multi-disciplinary discharge planning process, led by the attending physician.  Recommendations may be updated based on patient status, additional functional criteria and insurance authorization.   Follow Up Recommendations  Acute inpatient rehab (3hours/day)    Assistance Recommended at Discharge Frequent or constant Supervision/Assistance  Patient can return home with the following Two people to help with walking and/or transfers;Two people to help with bathing/dressing/bathroom;Help with stairs or ramp for entrance    Functional Status Assessment  Patient has had a recent decline in their functional status and demonstrates the ability to make significant improvements in function in a reasonable and predictable amount of time.  Equipment Recommendations  Other (comment) (defer)    Recommendations for Other Services       Precautions / Restrictions Precautions Precautions: Fall Restrictions Weight Bearing Restrictions: No      Mobility Bed  Mobility Overal bed mobility: Needs Assistance Bed Mobility: Supine to Sit     Supine to sit: Mod assist          Transfers Overall transfer level: Needs assistance Equipment used: Rolling walker (2 wheels) Transfers: Sit to/from Stand, Bed to chair/wheelchair/BSC Sit to Stand: Mod assist     Step pivot transfers: Min assist, +2 safety/equipment            Balance Overall balance assessment: Needs assistance Sitting-balance support: Feet supported Sitting balance-Leahy Scale: Good     Standing balance support: Bilateral upper extremity supported, During functional activity, Reliant on assistive device for balance Standing balance-Leahy Scale: Fair                             ADL either performed or assessed with clinical judgement   ADL Overall ADL's : Needs assistance/impaired                                       General ADL Comments: MAX A don B socks seated EOB. MIN A x2 + RW for smulated BSC t/f. SETUP self-feeding in sitting      Pertinent Vitals/Pain Pain Assessment Pain Assessment: 0-10 Pain Score: 5  Pain Location: abdomen, bottom Pain Descriptors / Indicators: Discomfort, Operative site guarding, Grimacing, Guarding, Moaning, Sore Pain Intervention(s): Limited activity within patient's tolerance, Repositioned     Hand Dominance     Extremity/Trunk Assessment Upper Extremity Assessment Upper Extremity Assessment: Generalized weakness   Lower Extremity Assessment Lower Extremity Assessment: Generalized weakness   Cervical / Trunk Assessment Cervical / Trunk Assessment:  Normal   Communication Communication Communication: No difficulties   Cognition Arousal/Alertness: Awake/alert Behavior During Therapy: WFL for tasks assessed/performed, Flat affect Overall Cognitive Status: Within Functional Limits for tasks assessed                                                  Home Living  Family/patient expects to be discharged to:: Private residence Living Arrangements: Spouse/significant other Available Help at Discharge: Family;Available 24 hours/day Type of Home: House Home Access: Stairs to enter [employer] of Steps: 3 Entrance Stairs-Rails: Right Home Layout: One level     Bathroom Shower/Tub: Walk-in shower         Home Equipment: None      Lives With: Spouse    Prior Functioning/Environment Prior Level of Function : Independent/Modified Independent;Working/employed             Mobility Comments: indep, driving ADLs Comments: indep        OT Problem List: Decreased strength;Decreased range of motion;Decreased activity tolerance;Impaired balance (sitting and/or standing);Decreased safety awareness      OT Treatment/Interventions: Self-care/ADL training;Therapeutic exercise;DME and/or AE instruction;Energy conservation;Therapeutic activities;Patient/family education;Balance training    OT Goals(Current goals can be found in the care plan section) Acute Rehab OT Goals Patient Stated Goal: to return to PLOF OT Goal Formulation: With patient/family Time For Goal Achievement: 10/21/22 Potential to Achieve Goals: Good  OT Frequency: Min 3X/week    Co-evaluation PT/OT/SLP Co-Evaluation/Treatment: Yes Reason for Co-Treatment: For patient/therapist safety;To address functional/ADL transfers PT goals addressed during session: Mobility/safety with mobility OT goals addressed during session: ADL's and self-care      AM-PAC OT "6 Clicks" Daily Activity     Outcome Measure Help from another person eating meals?: None Help from another person taking care of personal grooming?: A Lot Help from another person toileting, which includes using toliet, bedpan, or urinal?: A Lot Help from another person bathing (including washing, rinsing, drying)?: A Lot Help from another person to put on and taking off regular upper body clothing?: A  Little Help from another person to put on and taking off regular lower body clothing?: A Lot 6 Click Score: 15   End of Session Nurse Communication: Mobility status  Activity Tolerance: Patient limited by fatigue;Patient limited by pain Patient left: in chair;with call bell/phone within reach;with family/visitor present  OT Visit Diagnosis: Other abnormalities of gait and mobility (R26.89);Muscle weakness (generalized) (M62.81)                Time: 9407-6808 OT Time Calculation (min): 20 min Charges:  OT General Charges $OT Visit: 1 Visit OT Evaluation $OT Re-eval: 1 Re-eval OT Treatments $Self Care/Home Management : 8-22 mins  Dessie Coma, M.S. OTR/L  10/07/22, 1:41 PM  ascom [phone]

## 2022-10-07 NOTE — Consult Note (Addendum)
ANTICOAGULATION CONSULT NOTE  Pharmacy Consult for Heparin Infusion Indication: atrial fibrillation  Patient Measurements: Height: '6\' 2"'$  (188 cm) Weight: 126.5 kg (278 lb 14.1 oz) IBW/kg (Calculated) : 82.2 Heparin Dosing Weight: 107.3 kg  Labs: Recent Labs    10/04/22 1310 10/04/22 2001 10/04/22 2200 10/05/22 0440 10/05/22 1842 10/05/22 2045 10/05/22 2207 10/06/22 0013 10/06/22 0110 10/06/22 0457 10/06/22 1135 10/07/22 0510  HGB  --   --    < > 7.2* 7.4*  --   --    < > 8.8* 8.2* 8.0* 8.0*  HCT  --   --    < > 23.3* 23.0*  --   --    < > 29.1* 27.3* 25.7* 26.6*  PLT  --   --    < > 405* 402*  --   --   --   --  499*  --  422*  LABPROT  --   --   --  16.5*  --   --   --   --   --   --   --   --   INR  --   --   --  1.3*  --   --   --   --   --   --   --   --   HEPARINUNFRC 0.28* 0.27*  --   --   --   --   --   --   --   --   --   --   CREATININE  --   --    < > 1.32* 1.24  --  1.36*  --  1.45* 1.52*  --  1.43*  TROPONINIHS  --   --   --   --   --  23* 26*  --  25*  --   --   --    < > = values in this interval not displayed.     Estimated Creatinine Clearance: 71.8 mL/min (A) (by C-G formula based on SCr of 1.43 mg/dL (H)).   Medical History: Past Medical History:  Diagnosis Date   Adenocarcinoma of colon ([place]) 09/08/2022   Cholelithiasis    Chronic gastritis    Colon polyps    Coronary artery calcification seen on CT scan    Diastolic dysfunction    a.) TTE 04/09/2022: EF >55%, triv TR, G1DD   Diverticulosis    Hypertension    Long term current use of anticoagulant    a.) apixaban   Nephrolithiasis    Paroxysmal A-fib ([place])    a.) CHA2DS2VASc = 3 (age, HTN, T2DM);  b.) s/p DCCV 07/09/2016 (100 J x1), 12/22/2018 (120 J x1), 08/18/2021 (120 J x1), 03/11/2022 (120 J x1), 03/31/2022 (120 J x1); c.) rate/rhythm maintained on oral flecanide + metoprolol succinate; chronically anticoagulated with apixaban   T2DM (type 2 diabetes mellitus) ([place])     Assessment: Nicolas Zuniga is a 66 y.o. male presenting for laparoscopic R colectomy and cholecystectomy. PMH significant for recently diagnosed adenocarcinoma of the colon, DM, HTN, Afib on metoprolol, flecainide, and apixaban. Patient was on [facility] PTA per chart review, last dose of apixaban PTA reported to be on 10/16. Last enoxaparin dose 10/20 AM. Pharmacy has been consulted to initiate and manage heparin infusion. Now s/p procedure. Hgb stable.  10/29 0538 HL 0.19  10/29 1310 HL 0.28 10/29 2001 HL 0.27  Goal of Therapy:  Heparin level 0.3 - 0.5 units/ml (no bolus protocol) Monitor platelets by anticoagulation protocol: Yes  Plan:  -No bolus -Restart heparin at 1800 units/hr (~16 units/kg/hr). Required high dose of heparin previously, therefore expect will need to increase rate. Restarting cautiously given previous bleeding at drain site. -HL in 6 hours from initiation -CBC daily  Thank you for allowing pharmacy to be a part of this patient's care.  Wynelle Cleveland, PharmD 10/07/2022 9:29 AM

## 2022-10-07 NOTE — Evaluation (Signed)
Physical Therapy Evaluation Patient Details Name: Nicolas Zuniga MRN: [id number] DOB: 05-04-1956 Today's Date: 10/07/2022  History of Present Illness  66 y.o. male Post-Op s/p exploratory laparotomy, abdominal washout, and diverting loop ileostomy for intra-abdominal purulent fluid after initial open right colectomy with open cholecystectomy for right colon cancer and cholelithiasis on 10/19, RUQ drain placed recently then intubated. Now extubated 10/31  Clinical Impression  Patient received in bed, he reports abdominal and bottom pain. Reports he feels like he needs to pee but is unable during session. RN notified. Patient required mod A for supine to sit. Min +2 for sit to stand and to step to recliner with RW. Patient is limited by pain and fatigue. He will continue to benefit from skilled PT while here to improve functional independence, strength and activity tolerance.         Recommendations for follow up therapy are one component of a multi-disciplinary discharge planning process, led by the attending physician.  Recommendations may be updated based on patient status, additional functional criteria and insurance authorization.  Follow Up Recommendations Acute inpatient rehab (3hours/day)      Assistance Recommended at Discharge Frequent or constant Supervision/Assistance  Patient can return home with the following  A lot of help with walking and/or transfers;A lot of help with bathing/dressing/bathroom;Help with stairs or ramp for entrance;Direct supervision/assist for medications management    Equipment Recommendations Rolling walker (2 wheels)  Recommendations for Other Services  Rehab consult    Functional Status Assessment Patient has had a recent decline in their functional status and demonstrates the ability to make significant improvements in function in a reasonable and predictable amount of time.     Precautions / Restrictions Precautions Precautions:  Fall Restrictions Weight Bearing Restrictions: No      Mobility  Bed Mobility Overal bed mobility: Needs Assistance Bed Mobility: Supine to Sit, Rolling, Sidelying to Sit Rolling: Mod assist Sidelying to sit: Mod assist Supine to sit: Mod assist     General bed mobility comments: cues needed    Transfers Overall transfer level: Needs assistance Equipment used: Rolling walker (2 wheels) Transfers: Sit to/from Stand, Bed to chair/wheelchair/BSC Sit to Stand: Min assist, From elevated surface, +2 safety/equipment   Step pivot transfers: Min assist, +2 safety/equipment       General transfer comment: benefits from +2 for safety due to line management, patient with significant weakness    Ambulation/Gait Ambulation/Gait assistance: Min assist, +2 safety/equipment Gait Distance (Feet): 3 Feet Assistive device: Rolling walker (2 wheels) Gait Pattern/deviations: Step-to pattern, Decreased step length - right, Decreased step length - left, Trunk flexed, Decreased stride length Gait velocity: decr     General Gait Details: patient stood edge of bed x several minutes then stepped to Physicist, medical    Modified Rankin (Stroke Patients Only)       Balance Overall balance assessment: Needs assistance Sitting-balance support: Feet supported Sitting balance-Leahy Scale: Good Sitting balance - Comments: supervision   Standing balance support: Bilateral upper extremity supported, During functional activity, Reliant on assistive device for balance Standing balance-Leahy Scale: Fair Standing balance comment: no lob, very weak                             Pertinent Vitals/Pain Pain Assessment Pain Assessment: 0-10 Pain Score: 5  Pain Location: abdomen, bottom Pain Descriptors / Indicators: Discomfort, Operative site guarding,  Grimacing, Guarding, Moaning, Sore Pain Intervention(s): Monitored during session, Repositioned     Home Living Family/patient expects to be discharged to:: Inpatient rehab Living Arrangements: Spouse/significant other Available Help at Discharge: Family;Available 24 hours/day Type of Home: House Home Access: Stairs to enter Entrance Stairs-Rails: Right Entrance Stairs-Number of Steps: 3   Home Layout: One level Home Equipment: None      Prior Function Prior Level of Function : Independent/Modified Independent;Working/employed             Mobility Comments: indep, driving ADLs Comments: indep     Hand Dominance        Extremity/Trunk Assessment   Upper Extremity Assessment Upper Extremity Assessment: Defer to OT evaluation    Lower Extremity Assessment Lower Extremity Assessment: Generalized weakness    Cervical / Trunk Assessment Cervical / Trunk Assessment: Normal  Communication   Communication: No difficulties  Cognition Arousal/Alertness: Awake/alert, Lethargic Behavior During Therapy: WFL for tasks assessed/performed, Flat affect Overall Cognitive Status: Within Functional Limits for tasks assessed                                 General Comments: fair command following        General Comments      Exercises     Assessment/Plan    PT Assessment Patient needs continued PT services  PT Problem List Decreased strength;Decreased mobility;Pain;Decreased activity tolerance;Decreased knowledge of use of DME       PT Treatment Interventions Gait training;DME instruction;Therapeutic exercise;Functional mobility training;Therapeutic activities;Patient/family education;Stair training    PT Goals (Current goals can be found in the Care Plan section)  Acute Rehab PT Goals Patient Stated Goal: to get stronger PT Goal Formulation: With patient/family Time For Goal Achievement: 10/14/22 Potential to Achieve Goals: Good    Frequency Min 2X/week     Co-evaluation PT/OT/SLP Co-Evaluation/Treatment: Yes Reason for Co-Treatment: For  patient/therapist safety;Complexity of the patient's impairments (multi-system involvement);To address functional/ADL transfers PT goals addressed during session: Mobility/safety with mobility;Balance;Proper use of DME         AM-PAC PT "6 Clicks" Mobility  Outcome Measure Help needed turning from your back to your side while in a flat bed without using bedrails?: A Lot Help needed moving from lying on your back to sitting on the side of a flat bed without using bedrails?: A Lot Help needed moving to and from a bed to a chair (including a wheelchair)?: A Lot Help needed standing up from a chair using your arms (e.g., wheelchair or bedside chair)?: A Lot Help needed to walk in hospital room?: A Lot Help needed climbing 3-5 steps with a railing? : Total 6 Click Score: 11    End of Session   Activity Tolerance: Patient limited by fatigue;Patient limited by pain Patient left: in chair;with call bell/phone within reach;with family/visitor present Nurse Communication: Mobility status PT Visit Diagnosis: Muscle weakness (generalized) (M62.81);Pain;Difficulty in walking, not elsewhere classified (R26.2) Pain - Right/Left:  (abdomen and bottom)    Time: 4097-3532 PT Time Calculation (min) (ACUTE ONLY): 20 min   Charges:   PT Evaluation $PT Re-evaluation: 1 Re-eval          Omeed Osuna, PT, GCS 10/07/22,11:31 AM

## 2022-10-07 NOTE — Progress Notes (Signed)
[facility] NAME: Nicolas Zuniga    MR#:  [id number]  DATE OF BIRTH:  Sep 23, 1956  SUBJECTIVE:   Patient transferred to [facility] service. Was extubated for 24 hours. Currently on room air. No family at bedside. Tolerating tube feeding. Tolerating clear liquid. Discussed with advancing as tolerated.   VITALS:  Blood pressure 133/63, pulse 80, temperature 98.2 F (36.8 C), temperature source Oral, resp. rate 16, height '6\' 2"'$  (1.88 m), weight 126.5 kg, SpO2 97 %.  PHYSICAL EXAMINATION:   GENERAL:  66 y.o.-year-old patient lying in the bed with no acute distress. Appears chronically ill LUNGS: decreased breath sounds bilaterally, no wheezing CARDIOVASCULAR: S1, S2 normal. No murmurs,   ABDOMEN: Soft, non-distended, no rebound/guarding. Surgical drain with serosanguinous output. 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 Newly placed drain in RUQ; serous fluid EXTREMITIES: dependent edema b/l.    NEUROLOGIC: nonfocal  patient is alert and awake SKIN: No obvious rash, lesion, or ulcer.   LABORATORY PANEL:  CBC Recent Labs  Lab 10/07/22 0510  WBC 19.8*  HGB 8.0*  HCT 26.6*  PLT 422*    Chemistries  Recent Labs  Lab 10/06/22 0110 10/06/22 0457 10/07/22 0510  NA 138   < > 141  K 4.9   < > 4.7  CL 107   < > 109  CO2 26   < > 28  GLUCOSE 156*   < > 191*  BUN 35*   < > 34*  CREATININE 1.45*   < > 1.43*  CALCIUM 8.7*   < > 8.8*  MG  --    < > 2.0  AST 20  --   --   ALT 11  --   --   ALKPHOS 64  --   --   BILITOT 0.5  --   --    < > = values in this interval not displayed.   Cardiac Enzymes No results for input(s): "TROPONINI" in the last 168 hours. RADIOLOGY:  DG Abd 1 View  Result Date: 10/06/2022 CLINICAL DATA:  Nasogastric tube placement. EXAM: ABDOMEN - 1 VIEW COMPARISON:  10/05/2022 FINDINGS: The previously demonstrated nasogastric tube has been replaced with a feeding tube  with its tip in the proximal stomach. Normal bowel gas pattern with small amount of contrast in the stomach and bowel. Lumbar and thoracic spine degenerative changes. Mildly progressive left basilar atelectasis. IMPRESSION: 1. Feeding tube tip in the proximal stomach. 2. Mildly progressive left basilar atelectasis. Electronically Signed   By: Claudie Revering M.D.   On: 10/06/2022 12:54   DG Abd 1 View  Result Date: 10/05/2022 CLINICAL DATA:  Orogastric tube placement EXAM: ABDOMEN - 1 VIEW COMPARISON:  None Available. FINDINGS: Orogastric tube tip overlies expected proximal to mid body of the stomach. Central venous catheter tip noted within the superior vena cava. Pigtail drainage catheter overlies the right hepatic lobe. Surgical clips are seen within the right mid abdomen. Visualized abdominal gas pattern is nonspecific due to a paucity of gas within the upper abdomen. No gross free intraperitoneal gas. IMPRESSION: 1. Orogastric tube tip within the proximal to mid body of the stomach. Electronically Signed   By: Fidela Salisbury M.D.   On: 10/05/2022 20:34   DG Chest 1 View  Result Date: 10/05/2022 CLINICAL DATA:  Intubation EXAM: CHEST  1 VIEW COMPARISON:  Chest x-ray 09/28/2022 FINDINGS: Endotracheal tube tip is 3 cm  above the carina. Right upper extremity PICC terminates over the distal SVC. Enteric tube extends below the diaphragm, distal tip is not included on the image. Lung volumes are low. There are patchy opacities in the left mid lung and right lung base similar to prior. No pleural effusion or pneumothorax. The cardiomediastinal silhouette is stable, the heart is enlarged. The osseous structures are unchanged. IMPRESSION: 1. Endotracheal tube tip is 3 cm above the carina. 2. Stable patchy opacities in the left mid lung and right lung base. Electronically Signed   By: Ronney Asters M.D.   On: 10/05/2022 18:51    Assessment and Plan  Nicolas Zuniga is a 66 y.o. male with hx of recently diagnosed  adenocarcinoma of the colon who is currently admitted status post right colectomy, diabetes, hypertension, and A-fib status post multiple ablations currently on metoprolol and flecainide.  09/24/2022 - Underwent a laparoscopic right colectomy that was converted to open right colectomy along with a cholecystectomy - no significant complications and EBL was minimal at 80 cc.   10/20: Seen early morning by Dr. Dahlia Byes (general surgery) and was doing well.  Starting around 6 PM he began to have hypotension 70/41 and the hospitalist service was consulted. Pt was given total of 25 g of albumin, 2 L of normal saline, and started on glucagon gtt concern for beta blocker toxicity. His metoprolol and flecainide were held. BP improved. Mild AKI on labs Cr 1.93 down form admission 1.23, likely hypovolemia. D/c glucagon. Strict I&O. Continue hold beta blocker and flecainide, may reduce dose metoprolol on discharge.  10/21: BP soft but improved 115/53, 117/64. Cr improved 1.66. Hgb stable.  Rapid response overnight, A-fib RVR.  Transferred to stepdown, amiodarone infusion, minimal response, diltiazem infusion.  Dr. Humphrey Rolls, cardiology, aware. Dr Dahlia Byes ordering CT chest/abdomen/pelvis, starting heparin drip. CT no PE, (+) Small bowel leak, certainly no contrast extravasation and seems contained --> plan repeat CT, hold off on surgical intervention unless emergent, starting abx.  10/23: Patient went for repeat CT noting increased extravasation concerning for anastomotic leak.  Patient taken back to the OR and well no leak found, underwent bowel follow-through and washout.  Postop, patient in ICU and still intubated.  ABG notes severe respiratory acidosis.  Note that should patient remain on ventilator, will have critical care assumed medical management in conjunction with surgery. 10/24 for worsening acute renal failure, nephrology consulted.  Patient with bradycardia overnight and Cardizem and Precedex discontinued. 10/25  extubated, off vasopressors, weaned off insulin drip to subcutaneous insulin 10/28.  CT scan showing.  Decreasing intraperitoneal free gas, free fluid on the left liver similar.  Multiple fluid collections in the mesentery. 10/30 interventional radiology performed a IR drainage procedure.  Patient ordered a unit of packed red blood cells on a hemoglobin of 7.2.  I started Lasix for 2 doses. 10/31--remained in ICU was intubated for 24 hours. 11/1--Extubated yday--Dobhoff feeding and ok to cont CLD. No bleeding. OK to start po eliquis (d/w dr Dahlia Byes). Will transfer out of ICU. Resume PT     Assessment and Plan:  Adenocarcinoma of colon ([place]) Right colon cancer status post laparoscopic right colectomy and cholecystectomy on 09/24/2022. Intra-abdominal infection status post exploratory laparotomy with abdominal washout and diverting loop ileostomy on 10/23 10/29 Meropenem started with persistent leukocytosis and Zosyn discontinued. 10/30 interventional radiology did a CT aspiration of left anterior abdominal fluid collection and placed a drain for the  perihepatic fluid collection. --pt on IV meropenem and Micafungin--per ID  Septic shock ([place]) Septic shock has resolved.  Off pressors at this point.     Paroxysmal A-fib ([facility]) Currently on amiodarone and metoprolol 11/1--started Eliquis (d/w surgery and [facility] cardiology)   AKI (acute kidney injury) ([place]) Creatinine peaked at 4.61 on 09/30/2022 improved down to 1.43   Generalized anxiety disorder patient did not want to take gabapentin anymore.  I advised to continue taking the Lexapro to prevent anxiety   Microcytic anemia Surgical team ordered a unit of blood. Hgb 8.0   Weakness Continue working with physical therapy   Acute hypoxic respiratory failure [facility]) Patient required intubation during the hospital course.   Weaned to RA   Hyponatremia Last sodium normal range--resolved   Diabetes ([place]) Type 2 diabetes mellitus on  long-acting insulin with being on Tube feeding Last hemoglobin A1c 9.8.          Procedures:as above Family communication :none today Consults :Gen surgery, ID, PCCM CODE STATUS: FULL DVT Prophylaxis :eliquis Level of care: Telemetry Medical Status is: Inpatient Remains inpatient appropriate because: complex post surgical complications    TOTAL TIME TAKING CARE OF THIS PATIENT: 35 minutes.  >50% time spent on counselling and coordination of care  Note: This dictation was prepared with Dragon dictation along with smaller phrase technology. Any transcriptional errors that result from this process are unintentional.  Fritzi Mandes M.D    [facility]   CC: Primary care physician; Hortencia Pilar, MD

## 2022-10-07 NOTE — Progress Notes (Signed)
Date of Admission:  09/24/2022      ID: Nicolas Zuniga is a 66 y.o. male Principal Problem:   Adenocarcinoma of colon ([place]) Active Problems:   Diabetes ([place])   Essential hypertension   Paroxysmal A-fib ([facility])   S/P right colectomy   S/P partial resection of colon   Hyponatremia   AKI (acute kidney injury) ([place])   Postoperative intra-abdominal abscess   Persistent atrial fibrillation ([facility])   Septic shock (H[facility])   Diabetic ketoacidosis without coma associated with type 2 diabetes mellitus (HCC)   Generalized anxiety disorder   Acute hypoxic respiratory failure (HCC)   Weakness   Microcytic anemia   Intra-abdominal abscess ([place])  Nicolas Zuniga is a 66 y.o. male with a history of HTN, Paroxysmal afib on eliquis, DM, adeno carcinoma of colon Was admitted for elective rt hemicolectomy on 09/24/22. The attempted lap colectomy was converted to laparotomy. HE also underwent cholecystectomy for chronic cholecystitis He started having increasing  cr, wbc and repeat cts were done' He was taken back for surgery on 09/28/22 and underwent exp laparotomy, abdominal washout, blake drain placement and diverting loop ileostomy . There was no leak identified. Midline laparotomy was allowed to heal by secondary intention Pt had a foley 09/28/22 and PICC placed on 09/27/22 Started on zosyn since 09/27/22 He was in ICU and remained intubated until 09/30/22 - weaned off pressors Because of worsening WBC he had repeat CT on 10/28 which showed multiple collections in the abdomen Meropenem was started and zosyn Dc 'today intervention radiology placed a drain on the rt upper quadrant collection and aspirated 2 more collections and sent all three for culture Pt has been having shallow breathing and lethargy since then and was transferred to ICU and was intubated . Has been extubated today  Subjective: Out of icu Says he is okay Still lethargic NG tibe  Medications:   amiodarone  200 mg Per Tube Daily    Chlorhexidine Gluconate Cloth  6 each Topical Daily   feeding supplement (PROSource TF20)  60 mL Per Tube Daily   free water  30 mL Per Tube Q4H   insulin aspart  0-20 Units Subcutaneous Q4H   insulin aspart  5 Units Subcutaneous Q4H   insulin detemir  15 Units Subcutaneous BID   metoprolol tartrate  25 mg Oral BID   pantoprazole (PROTONIX) IV  40 mg Intravenous QHS   sodium chloride flush  10-40 mL Intracatheter Q12H   sodium chloride flush  5 mL Intracatheter Q8H    Objective: Vital signs in last 24 hours: Patient Vitals for the past 24 hrs:  BP Temp Temp src Pulse Resp SpO2 Weight  10/07/22 1023 133/63 98.2 F (36.8 C) Oral 80 16 97 % --  10/07/22 0900 (!) 162/77 -- -- 81 (!) 26 96 % --  10/07/22 0800 (!) 155/65 98.4 F (36.9 C) Oral 76 (!) 22 98 % --  10/07/22 0700 (!) 145/60 -- -- 74 (!) 24 100 % --  10/07/22 0600 (!) 144/60 -- -- 74 (!) 24 100 % --  10/07/22 0433 -- -- -- -- -- -- 126.5 kg  10/07/22 0400 (!) 146/73 98.6 F (37 C) Oral 78 (!) 23 100 % --  10/07/22 0200 131/66 -- -- 82 (!) 28 96 % --  10/07/22 0000 (!) 129/58 98.2 F (36.8 C) Oral 81 (!) 24 97 % --  10/06/22 2200 129/61 -- -- 81 (!) 26 99 % --  10/06/22 2000 133/61 98.1 F (  36.7 C) Oral 84 (!) 27 100 % --  10/06/22 1800 127/71 -- -- 92 (!) 27 98 % --  10/06/22 1700 127/63 -- -- 94 (!) 29 98 % --  10/06/22 1600 (!) 144/62 97.6 F (36.4 C) Oral 93 (!) 24 97 % --  10/06/22 1500 (!) 141/62 -- -- 90 (!) 28 97 % --  10/06/22 1400 (!) 119/56 -- -- 93 (!) 29 98 % --  10/06/22 1300 127/65 -- -- 89 (!) 30 96 % --     LDA Foley PICC  PHYSICAL EXAM:  General: lethargic, responds to quesitons  Lungs: b/l air entry Heart: s1s2 Abdomen: surgical dressing over the abdominal surgical wound- rt upper quadrant drain 'ileostomy JP drain left Extremities: atraumatic, no cyanosis. No edema. No clubbing Skin: No rashes or lesions. Or bruising Lymph: Cervical, supraclavicular normal. Neurologic: Grossly  non-focal  Lab Results Recent Labs    10/06/22 0457 10/06/22 1135 10/07/22 0510  WBC 31.1*  --  19.8*  HGB 8.2* 8.0* 8.0*  HCT 27.3* 25.7* 26.6*  NA 139  --  141  K 4.8  --  4.7  CL 107  --  109  CO2 26  --  28  BUN 35*  --  34*  CREATININE 1.52*  --  1.43*   Liver Panel Recent Labs    10/05/22 2207 10/06/22 0110  PROT 6.2* 6.7  ALBUMIN 1.7* 2.2*  AST 21 20  ALT 11 11  ALKPHOS 63 41  BILITOT 0.5 0.5   Microbiology: 10/05/22 Abdominal fluid X3 pending Rt perihepatic - No gorwth so far Left anteriro abdominal - NG Left post abdominal- yeast  Studies/Results: DG Abd 1 View  Result Date: 10/06/2022 CLINICAL DATA:  Nasogastric tube placement. EXAM: ABDOMEN - 1 VIEW COMPARISON:  10/05/2022 FINDINGS: The previously demonstrated nasogastric tube has been replaced with a feeding tube with its tip in the proximal stomach. Normal bowel gas pattern with small amount of contrast in the stomach and bowel. Lumbar and thoracic spine degenerative changes. Mildly progressive left basilar atelectasis. IMPRESSION: 1. Feeding tube tip in the proximal stomach. 2. Mildly progressive left basilar atelectasis. Electronically Signed   By: Claudie Revering M.D.   On: 10/06/2022 12:54   DG Abd 1 View  Result Date: 10/05/2022 CLINICAL DATA:  Orogastric tube placement EXAM: ABDOMEN - 1 VIEW COMPARISON:  None Available. FINDINGS: Orogastric tube tip overlies expected proximal to mid body of the stomach. Central venous catheter tip noted within the superior vena cava. Pigtail drainage catheter overlies the right hepatic lobe. Surgical clips are seen within the right mid abdomen. Visualized abdominal gas pattern is nonspecific due to a paucity of gas within the upper abdomen. No gross free intraperitoneal gas. IMPRESSION: 1. Orogastric tube tip within the proximal to mid body of the stomach. Electronically Signed   By: Fidela Salisbury M.D.   On: 10/05/2022 20:34   DG Chest 1 View  Result Date:  10/05/2022 CLINICAL DATA:  Intubation EXAM: CHEST  1 VIEW COMPARISON:  Chest x-ray 09/28/2022 FINDINGS: Endotracheal tube tip is 3 cm above the carina. Right upper extremity PICC terminates over the distal SVC. Enteric tube extends below the diaphragm, distal tip is not included on the image. Lung volumes are low. There are patchy opacities in the left mid lung and right lung base similar to prior. No pleural effusion or pneumothorax. The cardiomediastinal silhouette is stable, the heart is enlarged. The osseous structures are unchanged. IMPRESSION: 1. Endotracheal tube tip is 3 cm  above the carina. 2. Stable patchy opacities in the left mid lung and right lung base. Electronically Signed   By: Ronney Asters M.D.   On: 10/05/2022 18:51   CT GUIDED PERITONEAL/RETROPERITONEAL FLUID DRAIN BY PERC CATH  Result Date: 10/05/2022 INDICATION: Intra-abdominal fluid collections after prior right hemicolectomy and cholecystectomy for colon carcinoma and cholelithiasis. Persistent significant leukocytosis. EXAM: 1. CT-GUIDED ASPIRATION OF LEFT ANTERIOR PERITONEAL FLUID COLLECTION 2. CT-GUIDED ASPIRATION OF LEFT POSTEROLATERAL PERITONEAL FLUID COLLECTION 3. CT-GUIDED CATHETER DRAINAGE OF RIGHT PERIHEPATIC PERITONEAL FLUID COLLECTION 4. MEDICATIONS: None ANESTHESIA/SEDATION: Moderate (conscious) sedation was employed during this procedure. A total of Versed 2.0 mg and Fentanyl 100 mcg was administered intravenously by the radiology nurse. Total intra-service moderate Sedation Time: 52 minutes. The patient's level of consciousness and vital signs were monitored continuously by radiology nursing throughout the procedure under my direct supervision. COMPLICATIONS: None immediate. PROCEDURE: Informed written consent was obtained from the patient after a thorough discussion of the procedural risks, benefits and alternatives. All questions were addressed. Maximal Sterile Barrier Technique was utilized including caps, mask,  sterile gowns, sterile gloves, sterile drape, hand hygiene and skin antiseptic. A timeout was performed prior to the initiation of the procedure. CT of the abdomen was performed in a supine position. Localizing scans were performed with grids and sites marked on the skin in the upper right abdomen and mid to lower left abdomen. From a left lateral approach, an 18 gauge trocar needle was advanced into an anterior left-sided peritoneal fluid collection. After confirming needle tip position, aspiration was performed via the trocar needle utilizing syringes. A sample was sent for culture analysis. The needle was removed after additional CT was performed. A second 18 gauge trocar needle was then advanced from a left-sided approach into a left posterolateral peritoneal fluid collection. Aspiration was performed through the trocar needle. A fluid sample was sent for culture analysis. The needle was removed after additional CT imaging. An 18 gauge trocar needle was advanced in the right upper abdomen into the lateral perihepatic space. After confirming needle tip position and aspiration of fluid, a guidewire was advanced into the fluid collection. The percutaneous tract was dilated over the guidewire and a 10 French percutaneous drainage catheter advanced into the lateral perihepatic space. Catheter position was confirmed by CT. A fluid sample was withdrawn and sent for culture analysis. The drainage catheter was attached to a suction bulb and secured at the skin with a Prolene retention suture and adhesive device. FINDINGS: Aspiration of the elongated left anterior fluid collection immediately deep to the anterior abdominal wall yielded thin, clear yellow fluid. A total volume of 90 mL of fluid was able to be aspirated from this region resulting in complete decompression of the collection by CT after aspiration. Aspiration of the teardrop shaped fluid collection along the lateral paracolic region of the peritoneal cavity  yielded darker yellow fluid which was slightly turbid, especially towards the end of aspiration. Aspiration yielded 50 mL of fluid resulting in near complete decompression of the collection by CT after aspiration. Aspiration of the right lateral perihepatic fluid collection yielded thin, clear yellow fluid. Given that this is the largest single area fluid present in the peritoneal cavity currently, it was elected to place a drainage catheter in this fluid collection. IMPRESSION: 1. CT-guided needle aspiration of left anterior peritoneal fluid collection yielded 90 mL of thin, clear yellow fluid resulting in complete decompression. A fluid sample from this collection was sent for culture analysis. 2. CT-guided needle aspiration  left posterolateral paracolic fluid collection yielded 50 mL of slightly turbid yellow fluid resulting in near complete decompression. A fluid sample was sent for culture analysis. 3. CT-guided percutaneous catheter drainage was performed of the perihepatic fluid collection from a right lateral approach yielding thin, clear yellow fluid. A 10 French drainage catheter was placed and attached to suction bulb drainage. Electronically Signed   By: Aletta Edouard M.D.   On: 10/05/2022 16:22   CT GUIDED NEEDLE PLACEMENT  Result Date: 10/05/2022 INDICATION: Intra-abdominal fluid collections after prior right hemicolectomy and cholecystectomy for colon carcinoma and cholelithiasis. Persistent significant leukocytosis. EXAM: 1. CT-GUIDED ASPIRATION OF LEFT ANTERIOR PERITONEAL FLUID COLLECTION 2. CT-GUIDED ASPIRATION OF LEFT POSTEROLATERAL PERITONEAL FLUID COLLECTION 3. CT-GUIDED CATHETER DRAINAGE OF RIGHT PERIHEPATIC PERITONEAL FLUID COLLECTION 4. MEDICATIONS: None ANESTHESIA/SEDATION: Moderate (conscious) sedation was employed during this procedure. A total of Versed 2.0 mg and Fentanyl 100 mcg was administered intravenously by the radiology nurse. Total intra-service moderate Sedation Time: 52  minutes. The patient's level of consciousness and vital signs were monitored continuously by radiology nursing throughout the procedure under my direct supervision. COMPLICATIONS: None immediate. PROCEDURE: Informed written consent was obtained from the patient after a thorough discussion of the procedural risks, benefits and alternatives. All questions were addressed. Maximal Sterile Barrier Technique was utilized including caps, mask, sterile gowns, sterile gloves, sterile drape, hand hygiene and skin antiseptic. A timeout was performed prior to the initiation of the procedure. CT of the abdomen was performed in a supine position. Localizing scans were performed with grids and sites marked on the skin in the upper right abdomen and mid to lower left abdomen. From a left lateral approach, an 18 gauge trocar needle was advanced into an anterior left-sided peritoneal fluid collection. After confirming needle tip position, aspiration was performed via the trocar needle utilizing syringes. A sample was sent for culture analysis. The needle was removed after additional CT was performed. A second 18 gauge trocar needle was then advanced from a left-sided approach into a left posterolateral peritoneal fluid collection. Aspiration was performed through the trocar needle. A fluid sample was sent for culture analysis. The needle was removed after additional CT imaging. An 18 gauge trocar needle was advanced in the right upper abdomen into the lateral perihepatic space. After confirming needle tip position and aspiration of fluid, a guidewire was advanced into the fluid collection. The percutaneous tract was dilated over the guidewire and a 10 French percutaneous drainage catheter advanced into the lateral perihepatic space. Catheter position was confirmed by CT. A fluid sample was withdrawn and sent for culture analysis. The drainage catheter was attached to a suction bulb and secured at the skin with a Prolene retention  suture and adhesive device. FINDINGS: Aspiration of the elongated left anterior fluid collection immediately deep to the anterior abdominal wall yielded thin, clear yellow fluid. A total volume of 90 mL of fluid was able to be aspirated from this region resulting in complete decompression of the collection by CT after aspiration. Aspiration of the teardrop shaped fluid collection along the lateral paracolic region of the peritoneal cavity yielded darker yellow fluid which was slightly turbid, especially towards the end of aspiration. Aspiration yielded 50 mL of fluid resulting in near complete decompression of the collection by CT after aspiration. Aspiration of the right lateral perihepatic fluid collection yielded thin, clear yellow fluid. Given that this is the largest single area fluid present in the peritoneal cavity currently, it was elected to place a drainage catheter in  this fluid collection. IMPRESSION: 1. CT-guided needle aspiration of left anterior peritoneal fluid collection yielded 90 mL of thin, clear yellow fluid resulting in complete decompression. A fluid sample from this collection was sent for culture analysis. 2. CT-guided needle aspiration left posterolateral paracolic fluid collection yielded 50 mL of slightly turbid yellow fluid resulting in near complete decompression. A fluid sample was sent for culture analysis. 3. CT-guided percutaneous catheter drainage was performed of the perihepatic fluid collection from a right lateral approach yielding thin, clear yellow fluid. A 10 French drainage catheter was placed and attached to suction bulb drainage. Electronically Signed   By: Aletta Edouard M.D.   On: 10/05/2022 16:22   CT GUIDED NEEDLE PLACEMENT  Result Date: 10/05/2022 INDICATION: Intra-abdominal fluid collections after prior right hemicolectomy and cholecystectomy for colon carcinoma and cholelithiasis. Persistent significant leukocytosis. EXAM: 1. CT-GUIDED ASPIRATION OF LEFT  ANTERIOR PERITONEAL FLUID COLLECTION 2. CT-GUIDED ASPIRATION OF LEFT POSTEROLATERAL PERITONEAL FLUID COLLECTION 3. CT-GUIDED CATHETER DRAINAGE OF RIGHT PERIHEPATIC PERITONEAL FLUID COLLECTION 4. MEDICATIONS: None ANESTHESIA/SEDATION: Moderate (conscious) sedation was employed during this procedure. A total of Versed 2.0 mg and Fentanyl 100 mcg was administered intravenously by the radiology nurse. Total intra-service moderate Sedation Time: 52 minutes. The patient's level of consciousness and vital signs were monitored continuously by radiology nursing throughout the procedure under my direct supervision. COMPLICATIONS: None immediate. PROCEDURE: Informed written consent was obtained from the patient after a thorough discussion of the procedural risks, benefits and alternatives. All questions were addressed. Maximal Sterile Barrier Technique was utilized including caps, mask, sterile gowns, sterile gloves, sterile drape, hand hygiene and skin antiseptic. A timeout was performed prior to the initiation of the procedure. CT of the abdomen was performed in a supine position. Localizing scans were performed with grids and sites marked on the skin in the upper right abdomen and mid to lower left abdomen. From a left lateral approach, an 18 gauge trocar needle was advanced into an anterior left-sided peritoneal fluid collection. After confirming needle tip position, aspiration was performed via the trocar needle utilizing syringes. A sample was sent for culture analysis. The needle was removed after additional CT was performed. A second 18 gauge trocar needle was then advanced from a left-sided approach into a left posterolateral peritoneal fluid collection. Aspiration was performed through the trocar needle. A fluid sample was sent for culture analysis. The needle was removed after additional CT imaging. An 18 gauge trocar needle was advanced in the right upper abdomen into the lateral perihepatic space. After confirming  needle tip position and aspiration of fluid, a guidewire was advanced into the fluid collection. The percutaneous tract was dilated over the guidewire and a 10 French percutaneous drainage catheter advanced into the lateral perihepatic space. Catheter position was confirmed by CT. A fluid sample was withdrawn and sent for culture analysis. The drainage catheter was attached to a suction bulb and secured at the skin with a Prolene retention suture and adhesive device. FINDINGS: Aspiration of the elongated left anterior fluid collection immediately deep to the anterior abdominal wall yielded thin, clear yellow fluid. A total volume of 90 mL of fluid was able to be aspirated from this region resulting in complete decompression of the collection by CT after aspiration. Aspiration of the teardrop shaped fluid collection along the lateral paracolic region of the peritoneal cavity yielded darker yellow fluid which was slightly turbid, especially towards the end of aspiration. Aspiration yielded 50 mL of fluid resulting in near complete decompression of the collection  by CT after aspiration. Aspiration of the right lateral perihepatic fluid collection yielded thin, clear yellow fluid. Given that this is the largest single area fluid present in the peritoneal cavity currently, it was elected to place a drainage catheter in this fluid collection. IMPRESSION: 1. CT-guided needle aspiration of left anterior peritoneal fluid collection yielded 90 mL of thin, clear yellow fluid resulting in complete decompression. A fluid sample from this collection was sent for culture analysis. 2. CT-guided needle aspiration left posterolateral paracolic fluid collection yielded 50 mL of slightly turbid yellow fluid resulting in near complete decompression. A fluid sample was sent for culture analysis. 3. CT-guided percutaneous catheter drainage was performed of the perihepatic fluid collection from a right lateral approach yielding thin, clear  yellow fluid. A 10 French drainage catheter was placed and attached to suction bulb drainage. Electronically Signed   By: Aletta Edouard M.D.   On: 10/05/2022 16:22     Assessment/Plan: Adenocarcinoma of the rt colon- s/p hemicolectomy Intraabdominal abscesses  with worsening leucocytosis Exp lap on 09/28/22- no leak identified- but purulent fluid rt upper quadrant and 2 other collections aspirated- sent for culture Yeast in one of the culture Continue meropenem Micafungin  Leucocytosis finally improving   Acute hypoxic resp failure with hypercapnia - r/o medication effect/ aspiration Pt was intubated,  now extubated   Acute encephalopathy- likely due to the above, co2 narcosis-- improved   AFIB with RVR- controlled   Anemia   AKI-   Discussed the management with the care team

## 2022-10-08 ENCOUNTER — Inpatient Hospital Stay: Payer: Medicare Other

## 2022-10-08 ENCOUNTER — Inpatient Hospital Stay: Payer: Medicare Other | Attending: Oncology

## 2022-10-08 DIAGNOSIS — I480 Paroxysmal atrial fibrillation: Secondary | ICD-10-CM | POA: Diagnosis not present

## 2022-10-08 DIAGNOSIS — C189 Malignant neoplasm of colon, unspecified: Secondary | ICD-10-CM | POA: Diagnosis not present

## 2022-10-08 DIAGNOSIS — N179 Acute kidney failure, unspecified: Secondary | ICD-10-CM | POA: Diagnosis not present

## 2022-10-08 DIAGNOSIS — K651 Peritoneal abscess: Secondary | ICD-10-CM | POA: Diagnosis not present

## 2022-10-08 LAB — BASIC METABOLIC PANEL
Anion gap: 5 (ref 5–15)
BUN: 29 mg/dL — ABNORMAL HIGH (ref 8–23)
CO2: 27 mmol/L (ref 22–32)
Calcium: 8.5 mg/dL — ABNORMAL LOW (ref 8.9–10.3)
Chloride: 110 mmol/L (ref 98–111)
Creatinine, Ser: 1.2 mg/dL (ref 0.61–1.24)
GFR, Estimated: >60 mL/min (ref >60)
Glucose, Bld: 142 mg/dL — ABNORMAL HIGH (ref 70–99)
Potassium: 4 mmol/L (ref 3.5–5.1)
Sodium: 142 mmol/L (ref 135–145)

## 2022-10-08 LAB — CBC WITH DIFFERENTIAL/PLATELET
Abs Immature Granulocytes: 0.28 10*3/uL — ABNORMAL HIGH (ref 0.00–0.07)
Basophils Absolute: 0.1 10*3/uL (ref 0.0–0.1)
Basophils Relative: 0 %
Eosinophils Absolute: 0.1 10*3/uL (ref 0.0–0.5)
Eosinophils Relative: 0 %
HCT: 24.1 % — ABNORMAL LOW (ref 39.0–52.0)
Hemoglobin: 7.3 g/dL — ABNORMAL LOW (ref 13.0–17.0)
Immature Granulocytes: 1 %
Lymphocytes Relative: 8 %
Lymphs Abs: 1.7 10*3/uL (ref 0.7–4.0)
MCH: 24.3 pg — ABNORMAL LOW (ref 26.0–34.0)
MCHC: 30.3 g/dL (ref 30.0–36.0)
MCV: 80.3 fL (ref 80.0–100.0)
Monocytes Absolute: 1.9 10*3/uL — ABNORMAL HIGH (ref 0.1–1.0)
Monocytes Relative: 9 %
Neutro Abs: 16.7 10*3/uL — ABNORMAL HIGH (ref 1.7–7.7)
Neutrophils Relative %: 82 %
Platelets: 437 10*3/uL — ABNORMAL HIGH (ref 150–400)
RBC: 3 MIL/uL — ABNORMAL LOW (ref 4.22–5.81)
RDW: 17.8 % — ABNORMAL HIGH (ref 11.5–15.5)
WBC: 20.6 10*3/uL — ABNORMAL HIGH (ref 4.0–10.5)
nRBC: 0 % (ref 0.0–0.2)

## 2022-10-08 LAB — PHOSPHORUS: Phosphorus: 2.3 mg/dL — ABNORMAL LOW (ref 2.5–4.6)

## 2022-10-08 LAB — AEROBIC CULTURE W GRAM STAIN (SUPERFICIAL SPECIMEN)

## 2022-10-08 LAB — GLUCOSE, CAPILLARY
Glucose-Capillary: 170 mg/dL — ABNORMAL HIGH (ref 70–99)
Glucose-Capillary: 158 mg/dL — ABNORMAL HIGH (ref 70–99)
Glucose-Capillary: 230 mg/dL — ABNORMAL HIGH (ref 70–99)
Glucose-Capillary: 139 mg/dL — ABNORMAL HIGH (ref 70–99)
Glucose-Capillary: 176 mg/dL — ABNORMAL HIGH (ref 70–99)

## 2022-10-08 LAB — HEPARIN LEVEL (UNFRACTIONATED): Heparin Unfractionated: <0.1 IU/mL — ABNORMAL LOW (ref 0.30–0.70)

## 2022-10-08 MED ORDER — INSULIN ASPART 100 UNIT/ML IJ SOLN
8.0000 [IU] | INTRAMUSCULAR | Status: DC
  Administered 2022-10-08 – 2022-10-11 (×8): 8 [IU] via SUBCUTANEOUS
  Filled 2022-10-08 (×8): qty 1

## 2022-10-08 MED ORDER — PANTOPRAZOLE SODIUM 40 MG IV SOLR
40.0000 mg | Freq: Two times a day (BID) | INTRAVENOUS | Status: DC
  Administered 2022-10-08 – 2022-10-10 (×4): 40 mg via INTRAVENOUS
  Filled 2022-10-08 (×4): qty 10

## 2022-10-08 MED ORDER — FUROSEMIDE 10 MG/ML IJ SOLN
20.0000 mg | Freq: Once | INTRAMUSCULAR | Status: AC
  Administered 2022-10-08: 20 mg via INTRAVENOUS
  Filled 2022-10-08: qty 4

## 2022-10-08 MED ORDER — INSULIN DETEMIR 100 UNIT/ML ~~LOC~~ SOLN
18.0000 [IU] | Freq: Two times a day (BID) | SUBCUTANEOUS | Status: DC
  Administered 2022-10-08 – 2022-10-10 (×5): 18 [IU] via SUBCUTANEOUS
  Filled 2022-10-08 (×6): qty 0.18

## 2022-10-08 MED ORDER — LOPERAMIDE HCL 2 MG PO CAPS: 4.0000 mg | ORAL_CAPSULE | ORAL | Status: DC | PRN

## 2022-10-08 MED ORDER — ACETAMINOPHEN 500 MG PO TABS
1000.0000 mg | ORAL_TABLET | Freq: Four times a day (QID) | ORAL | Status: DC
  Administered 2022-10-08 – 2022-10-10 (×5): 1000 mg via ORAL
  Filled 2022-10-08 (×6): qty 2

## 2022-10-08 MED ORDER — ENSURE ENLIVE PO LIQD
237.0000 mL | Freq: Three times a day (TID) | ORAL | Status: DC
  Administered 2022-10-08 – 2022-10-09 (×2): 237 mL via ORAL

## 2022-10-08 MED ORDER — METOPROLOL TARTRATE 50 MG PO TABS
50.0000 mg | ORAL_TABLET | Freq: Two times a day (BID) | ORAL | Status: DC
  Administered 2022-10-08 – 2022-10-09 (×4): 50 mg via ORAL
  Filled 2022-10-08 (×4): qty 1

## 2022-10-08 MED ORDER — OXYCODONE HCL 5 MG PO TABS: 5.0000 mg | ORAL_TABLET | ORAL | Status: DC | PRN

## 2022-10-08 MED ORDER — PANTOPRAZOLE SODIUM 40 MG PO TBEC
40.0000 mg | DELAYED_RELEASE_TABLET | Freq: Every day | ORAL | Status: DC
  Administered 2022-10-08: 40 mg via ORAL
  Filled 2022-10-08: qty 1

## 2022-10-08 MED ORDER — HEPARIN (PORCINE) 25000 UT/250ML-% IV SOLN
2500.0000 [IU]/h | INTRAVENOUS | Status: DC
  Administered 2022-10-08: 1800 [IU]/h via INTRAVENOUS
  Administered 2022-10-09: 2500 [IU]/h via INTRAVENOUS
  Administered 2022-10-09: 2300 [IU]/h via INTRAVENOUS
  Filled 2022-10-08 (×3): qty 250

## 2022-10-08 NOTE — Progress Notes (Signed)
Nutrition Follow-up  DOCUMENTATION CODES:   Obesity unspecified  INTERVENTION:   Once NGT in good position:  Continue Osmolite 1.5_0 /hr + ProSource TF 20- Give 52m BID   Free water flushes 395mq4 hours to maintain tube patency   Regimen provides 2680kcal/day, 145g/day protein and 146031may of free water.   Ensure Enlive po TID, each supplement provides 350 kcal and 20 grams of protein.  Daily weights  NUTRITION DIAGNOSIS:   Increased nutrient needs related to post-op healing as evidenced by estimated needs.  GOAL:   Patient will meet greater than or equal to 90% of their needs -met with tube feeds   MONITOR:   PO intake, Supplement acceptance, Labs, Weight trends, TF tolerance, Skin, I & O's  ASSESSMENT:   66 3o male with h/o DM, HTN, Afib s/p cardioversion, anxiety, HLD, hiatal hernia, cholelithiasis and colon cancer (adenocarcinoma) s/p open right colectomy with ileocolic anastomosis and cholecystectomy 10/34/19mplicated by anastamotic leak s/p exploratory laparotomy and abdominal washout and diverting loop ileostomy 10/23. Pt also with new DKA and AKI. 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  10/30- s/p IR CT aspiration of left anterior abdominal fluid collection, aspiration of left posterolateral abdominal fluid collection and catheter drainage of perihepatic fluid collection  Met with pt in room today. Pt reports that he is feeling ok. Pt is weak and deconditioned. Pt with increased muscle and fat depletions on exam today. Pt on full liquid diet. Pt reports eating some broth and a few bites of grits. RN reports pt ate ~25% of breakfast and 15% of lunch. NGT in place but had backed out ~20cm; RN aware and will re-adjust. Recommend continue NGT and nutrition support. Can complete a calorie count once pt's oral intake improves to determine how much of his estimated needs he is meeting with oral intake. PT is recommending short  term rehab; pt will need good nutrition in order to rehabilitate. As pt with poor oral intake and wt loss of ~40lbs pta, RD does not anticipate pt will be able to eat enough to meet his estimated needs in the immediate future. Would recommend consideration of G-tube placement and continued nutrition support at SNF. Per chart, pt is back down to his UBW. Pt does have moderate edema but this is improved. RD will add Ensure to see how compliant pt will be with oral supplements.    Medications reviewed and include: insulin, protonix, heparin, meropenem, micafungin  Labs reviewed: K 4.0 wnl, BUN 29(H), P 2.3(L) Mg 2.0 wnl- 11/1 Triglycerides- 149 Wbc- 20.6(H), Hgb 7.3(L), Hct 24.1(L) Cbgs- 139, 230, 158, 170 x 24 hrs  Nutrition Focused Physical Exam:  Flowsheet Row Most Recent Value  Orbital Region No depletion  Upper Arm Region Unable to assess  Thoracic and Lumbar Region No depletion  Buccal Region Mild depletion  Temple Region Mild depletion  Clavicle Bone Region Moderate depletion  Clavicle and Acromion Bone Region Moderate depletion  Scapular Bone Region No depletion  Dorsal Hand No depletion  Patellar Region No depletion  Anterior Thigh Region No depletion  Posterior Calf Region No depletion  Edema (RD Assessment) Moderate  Hair Reviewed  Eyes Reviewed  Mouth Reviewed  Skin Reviewed  Nails Reviewed   Diet Order:    Diet Order             Diet full liquid Room service appropriate? Yes; Fluid consistency: Thin  Diet effective now  EDUCATION NEEDS:   No education needs have been identified at this time  Skin:  Skin Assessment: Skin Integrity Issues: Skin Integrity Issues:: DTI DTI: lt nare Incisions: closed abdomen  Last BM:  11/2- 313m via ostomy  Height:   Ht Readings from Last 1 Encounters:  09/28/22 _0  (1.88 m)    Weight:   Wt Readings from Last 1 Encounters:  10/08/22 126 kg    Ideal Body Weight:  86.4 kg  BMI:  Body mass  index is 35.66 kg/m.  Estimated Nutritional Needs:   Kcal:  2700-3000kcal/day  Protein:  135-150g/day  Fluid:  1.5L/day  CKoleen DistanceMS, RD, LDN Please refer to [facility]for RD and/or RD on-call/weekend/after hours pager

## 2022-10-08 NOTE — Care Management Important Message (Signed)
Important Message  Patient Details  Name: Nicolas Zuniga MRN: [id number] Date of Birth: 04/25/1956   Medicare Important Message Given:  Yes     Dannette Barbara 10/08/2022, 11:07 AM

## 2022-10-08 NOTE — Progress Notes (Addendum)
Occupational Therapy Treatment Patient Details Name: Nicolas Zuniga MRN: [id number] DOB: February 16, 1956 Today's Date: 10/08/2022   History of present illness 66 y.o. male Post-Op s/p exploratory laparotomy, abdominal washout, and diverting loop ileostomy for intra-abdominal purulent fluid after initial open right colectomy with open cholecystectomy for right colon cancer and cholelithiasis on 10/19, RUQ drain placed recently then intubated. Now extubated 10/31   OT comments  Nicolas Zuniga was seen for OT/PT co-treatment on this date. Upon arrival to room pt reclined in bed, premedicated for session, reports minimal pain, agreeable to tx. Pt requires MOD A exit bed, assist for trunk. SETUP + SUPERVISION self-feeding seated EOB, per spouse pt with poor appetite. Tolerates ~20 min static and dynamic sitting tasks. MIN A lateral scoot along EOB, pt defers standing citing concern for increased abdominal pain. Yellow theraband provided and pt/spouse instructed on HEP for bed level strengthening. Pt making progress toward goals, will continue to follow POC. Discharge recommendation remains appropriate however plan to reassess pt tolerance next session.     Recommendations for follow up therapy are one component of a multi-disciplinary discharge planning process, led by the attending physician.  Recommendations may be updated based on patient status, additional functional criteria and insurance authorization.    Follow Up Recommendations  Acute inpatient rehab (3hours/day) (re-assess next session)    Assistance Recommended at Discharge Frequent or constant Supervision/Assistance  Patient can return home with the following  Two people to help with walking and/or transfers;Two people to help with bathing/dressing/bathroom;Help with stairs or ramp for entrance   Equipment Recommendations  Other (comment) (defer)    Recommendations for Other Services      Precautions / Restrictions Precautions Precautions:  Fall Restrictions Weight Bearing Restrictions: No       Mobility Bed Mobility Overal bed mobility: Needs Assistance Bed Mobility: Rolling, Supine to Sit, Sit to Supine Rolling: Mod assist   Supine to sit: Mod assist Sit to supine: Mod assist   General bed mobility comments: assist for trunk 2/2 abdominal pain    Transfers Overall transfer level: Needs assistance   Transfers: Bed to chair/wheelchair/BSC            Lateral/Scoot Transfers: Min assist General transfer comment: along EOB     Balance Overall balance assessment: Needs assistance Sitting-balance support: Feet supported Sitting balance-Leahy Scale: Good                                     ADL either performed or assessed with clinical judgement   ADL Overall ADL's : Needs assistance/impaired                                       General ADL Comments: SETUP + SUPERVISION self-feeding seated EOB. MAX A don B socks seated      Cognition Arousal/Alertness: Awake/alert Behavior During Therapy: WFL for tasks assessed/performed, Flat affect Overall Cognitive Status: Within Functional Limits for tasks assessed                                 General Comments: requires encouragement        Exercises General Exercises - Upper Extremity Shoulder Horizontal ABduction: AROM, 5 reps, Left, Supine, Theraband Theraband Level (Shoulder Horizontal Abduction): Level 1 (Yellow) Shoulder Horizontal ADduction:  AROM, 5 reps, Left, Supine, Theraband Theraband Level (Shoulder Horizontal Adduction): Level 1 (Yellow) Elbow Flexion: AROM, 5 reps, Left, Supine, Theraband Theraband Level (Elbow Flexion): Level 1 (Yellow) Other Exercises Other Exercises: Discussed HEP with yellow theraband provided       General Comments  (Pt and wife educated at length regarding benefits of mobility and progressing OOB as pain is more controlled.)    Pertinent Vitals/ Pain       Pain  Assessment Pain Assessment: 0-10 Pain Score: 4  Pain Location: abdomen Pain Descriptors / Indicators: Discomfort, Operative site guarding, Grimacing Pain Intervention(s): Limited activity within patient's tolerance, Premedicated before session   Frequency  Min 3X/week        Progress Toward Goals  OT Goals(current goals can now be found in the care plan section)  Progress towards OT goals: Progressing toward goals  Acute Rehab OT Goals Patient Stated Goal: to return to PLOF OT Goal Formulation: With patient/family Time For Goal Achievement: 10/21/22 Potential to Achieve Goals: Good ADL Goals Pt Will Perform Grooming: with min assist;standing Pt Will Perform Lower Body Dressing: with min assist;sit to/from stand;with caregiver independent in assisting Pt Will Transfer to Toilet: with min assist;ambulating;bedside commode  Plan Discharge plan remains appropriate;Frequency remains appropriate    Co-evaluation    PT/OT/SLP Co-Evaluation/Treatment: Yes Reason for Co-Treatment: To address functional/ADL transfers;Complexity of the patient's impairments (multi-system involvement) PT goals addressed during session: Mobility/safety with mobility OT goals addressed during session: ADL's and self-care      AM-PAC OT "6 Clicks" Daily Activity     Outcome Measure   Help from another person eating meals?: None Help from another person taking care of personal grooming?: A Lot Help from another person toileting, which includes using toliet, bedpan, or urinal?: A Lot Help from another person bathing (including washing, rinsing, drying)?: A Lot Help from another person to put on and taking off regular upper body clothing?: A Little Help from another person to put on and taking off regular lower body clothing?: A Lot 6 Click Score: 15    End of Session    OT Visit Diagnosis: Other abnormalities of gait and mobility (R26.89);Muscle weakness (generalized) (M62.81)   Activity  Tolerance Patient tolerated treatment well;Patient limited by pain   Patient Left in bed;with call bell/phone within reach;with family/visitor present   Nurse Communication Mobility status        Time: 6333-5456 OT Time Calculation (min): 25 min  Charges: OT General Charges $OT Visit: 1 Visit OT Treatments $Self Care/Home Management : 8-22 mins  Dessie Coma, M.S. OTR/L  10/08/22, 3:25 PM  ascom [phone]

## 2022-10-08 NOTE — Consult Note (Signed)
ANTICOAGULATION CONSULT NOTE  Pharmacy Consult for Heparin Infusion Indication: atrial fibrillation  Patient Measurements: Height: '6\' 2"'$  (188 cm) Weight: 126 kg (277 lb 12.5 oz) IBW/kg (Calculated) : 82.2 Heparin Dosing Weight: 107.3 kg  Labs: Recent Labs    10/05/22 2045 10/05/22 2207 10/06/22 0013 10/06/22 0110 10/06/22 0457 10/06/22 1135 10/07/22 0510 10/07/22 1801 10/08/22 0554  HGB  --   --    < > 8.8* 8.2* 8.0* 8.0*  --  7.3*  HCT  --   --    < > 29.1* 27.3* 25.7* 26.6*  --  24.1*  PLT  --   --   --   --  499*  --  422*  --  437*  HEPARINUNFRC  --   --   --   --   --   --   --  <0.10*  --   CREATININE  --  1.36*  --  1.45* 1.52*  --  1.43*  --  1.20  TROPONINIHS 23* 26*  --  25*  --   --   --   --   --    < > = values in this interval not displayed.     Estimated Creatinine Clearance: 85.4 mL/min (by C-G formula based on SCr of 1.2 mg/dL).   Medical History: Past Medical History:  Diagnosis Date   Adenocarcinoma of colon ([place]) 09/08/2022   Cholelithiasis    Chronic gastritis    Colon polyps    Coronary artery calcification seen on CT scan    Diastolic dysfunction    a.) TTE 04/09/2022: EF >55%, triv TR, G1DD   Diverticulosis    Hypertension    Long term current use of anticoagulant    a.) apixaban   Nephrolithiasis    Paroxysmal A-fib ([place])    a.) CHA2DS2VASc = 3 (age, HTN, T2DM);  b.) s/p DCCV 07/09/2016 (100 J x1), 12/22/2018 (120 J x1), 08/18/2021 (120 J x1), 03/11/2022 (120 J x1), 03/31/2022 (120 J x1); c.) rate/rhythm maintained on oral flecanide + metoprolol succinate; chronically anticoagulated with apixaban   T2DM (type 2 diabetes mellitus) ([place])    Assessment: Nicolas Zuniga is a 66 y.o. male presenting for laparoscopic R colectomy and cholecystectomy. PMH significant for recently diagnosed adenocarcinoma of the colon, DM, HTN, Afib on metoprolol, flecainide, and apixaban. Patient was on [facility] PTA per chart review, last dose of apixaban PTA reported  to be on 10/16. Pharmacy has been consulted to initiate and manage heparin infusion. Now s/p procedure.  Heparin was re-started 11/1 and then held given possible hematochezia. No further signs of bleeding. Worsened anemia 11/2 AM. Per discussion with interdisciplinary team 11/2, will re-challenge heparin again.  Goal of Therapy:  Heparin level 0.3 - 0.5 units/ml (no bolus protocol) Monitor platelets by anticoagulation protocol: Yes   Plan: heparin level undetectable --No bolus protocol --increase heparin to 2300 units/hr Required high dose of heparin previously, therefore expect will need to increase rate. Restarting cautiously given previous bleeding at drain site. --heparin level in 6 hours from rate change --CBC daily  Thank you for allowing pharmacy to be a part of this patient's care.  Dallie Piles 10/08/2022 3:48 PM

## 2022-10-08 NOTE — Progress Notes (Signed)
Physical Therapy Treatment Patient Details Name: Nicolas Zuniga MRN: [id number] DOB: 1956/07/15 Today's Date: 10/08/2022   History of Present Illness 66 y.o. male Post-Op s/p exploratory laparotomy, abdominal washout, and diverting loop ileostomy for intra-abdominal purulent fluid after initial open right colectomy with open cholecystectomy for right colon cancer and cholelithiasis on 10/19, RUQ drain placed recently then intubated. Now extubated 10/31    PT Comments    Pt seen with OT today in order to progress mobility safely and educate pt on benefits of progressing function. Pt agreed once receiving Tylenol for pain.  Pt sat EOB for ~20 minutes with B LE's and Ue's supported. No LOB, yet did fatigue and c/o increased ABD discomfort requiring assist of 2 to return to bed. Pt appears to have the strength to progress gait/mobility, however actual transfer and posture needed causes increased pain, limiting pt tolerance. Will continue per POC.    Recommendations for follow up therapy are one component of a multi-disciplinary discharge planning process, led by the attending physician.  Recommendations may be updated based on patient status, additional functional criteria and insurance authorization.  Follow Up Recommendations  Acute inpatient rehab (3hours/day)     Assistance Recommended at Discharge Frequent or constant Supervision/Assistance  Patient can return home with the following A lot of help with walking and/or transfers;A lot of help with bathing/dressing/bathroom;Help with stairs or ramp for entrance;Direct supervision/assist for medications management   Equipment Recommendations  Rolling walker (2 wheels)    Recommendations for Other Services Rehab consult     Precautions / Restrictions Precautions Precautions: Fall Restrictions Weight Bearing Restrictions: No     Mobility  Bed Mobility Overal bed mobility: Needs Assistance Bed Mobility: Supine to Sit Rolling: Mod  assist Sidelying to sit: Mod assist Supine to sit: Mod assist Sit to supine: Mod assist   General bed mobility comments: vc's for technique    Transfers                   General transfer comment:  (Pt declined this date until pain is better controlled)    Ambulation/Gait                   Stairs             Wheelchair Mobility    Modified Rankin (Stroke Patients Only)       Balance Overall balance assessment: Needs assistance Sitting-balance support: Feet supported Sitting balance-Leahy Scale: Good Sitting balance - Comments: supervision                                    Cognition Arousal/Alertness: Awake/alert Behavior During Therapy: WFL for tasks assessed/performed, Flat affect Overall Cognitive Status: Within Functional Limits for tasks assessed                                 General Comments: Pt more engaged as session progressed        Exercises Low Level/ICU Exercises Ankle Circles/Pumps: AROM, Both, 15 reps    General Comments General comments (skin integrity, edema, etc.):  (Pt and wife educated at length regarding benefits of mobility and progressing OOB as pain is more controlled.)      Pertinent Vitals/Pain Pain Assessment Pain Assessment: 0-10 Pain Score: 6  Pain Location: abdomen, bottom Pain Descriptors / Indicators: Discomfort, Operative site guarding, Grimacing, Guarding,  Moaning, Sore Pain Intervention(s): Limited activity within patient's tolerance, Monitored during session, Premedicated before session    Home Living                          Prior Function            PT Goals (current goals can now be found in the care plan section) Acute Rehab PT Goals Patient Stated Goal: to get stronger    Frequency    Min 2X/week      PT Plan Current plan remains appropriate    Co-evaluation PT/OT/SLP Co-Evaluation/Treatment: Yes Reason for Co-Treatment: Complexity  of the patient's impairments (multi-system involvement);To address functional/ADL transfers PT goals addressed during session: Mobility/safety with mobility;Balance        AM-PAC PT "6 Clicks" Mobility   Outcome Measure  Help needed turning from your back to your side while in a flat bed without using bedrails?: A Lot Help needed moving from lying on your back to sitting on the side of a flat bed without using bedrails?: A Lot Help needed moving to and from a bed to a chair (including a wheelchair)?: A Lot Help needed standing up from a chair using your arms (e.g., wheelchair or bedside chair)?: A Lot Help needed to walk in hospital room?: A Lot Help needed climbing 3-5 steps with a railing? : Total 6 Click Score: 11    End of Session Equipment Utilized During Treatment: Oxygen Activity Tolerance: Patient limited by fatigue;Patient limited by pain Patient left: in bed;with call bell/phone within reach;with bed alarm set;with family/visitor present Nurse Communication: Mobility status;Patient requests pain meds PT Visit Diagnosis: Muscle weakness (generalized) (M62.81);Pain;Difficulty in walking, not elsewhere classified (R26.2) Pain - part of body:  (Abdomen)     Time: 1594-5859 PT Time Calculation (min) (ACUTE ONLY): 27 min  Charges:  $Therapeutic Activity: 8-22 mins                    Mikel Cella, PTA    Josie Dixon 10/08/2022, 2:42 PM

## 2022-10-08 NOTE — Progress Notes (Signed)
[facility] NAME: Nicolas Zuniga    MR#:  [id number]  DATE OF BIRTH:  1956/09/24  SUBJECTIVE:  no earlier. Wife at bedside. Has been taking orals but not any quake. I don't sleep well last night. Getting NG tube feeding. No fever.   VITALS:  Blood pressure (!) 128/59, pulse 79, temperature 98.2 F (36.8 C), temperature source Oral, resp. rate 20, height '6\' 2"'$  (1.88 m), weight 126 kg, SpO2 96 %.  PHYSICAL EXAMINATION:   GENERAL:  66 y.o.-year-old patient lying in the bed with no acute distress. Appears chronically ill LUNGS: decreased breath sounds bilaterally, no wheezing CARDIOVASCULAR: S1, S2 normal. No murmurs,   ABDOMEN: Soft, non-distended, no rebound/guarding. Surgical drain with serosanguinous output. 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 Newly placed drain in RUQ; serous fluid EXTREMITIES: dependent edema b/l.    NEUROLOGIC: nonfocal  patient is alert and awake SKIN: No obvious rash, lesion, or ulcer.   LABORATORY PANEL:  CBC Recent Labs  Lab 10/08/22 0554  WBC 20.6*  HGB 7.3*  HCT 24.1*  PLT 437*     Chemistries  Recent Labs  Lab 10/06/22 0110 10/06/22 0457 10/07/22 0510 10/08/22 0554  NA 138   < > 141 142  K 4.9   < > 4.7 4.0  CL 107   < > 109 110  CO2 26   < > 28 27  GLUCOSE 156*   < > 191* 142*  BUN 35*   < > 34* 29*  CREATININE 1.45*   < > 1.43* 1.20  CALCIUM 8.7*   < > 8.8* 8.5*  MG  --    < > 2.0  --   AST 20  --   --   --   ALT 11  --   --   --   ALKPHOS 64  --   --   --   BILITOT 0.5  --   --   --    < > = values in this interval not displayed.    Cardiac Enzymes No results for input(s): "TROPONINI" in the last 168 hours. RADIOLOGY:  No results found.  Assessment and Plan  Nicolas Zuniga is a 66 y.o. male with hx of recently diagnosed adenocarcinoma of the colon who is currently admitted status post right colectomy, diabetes,  hypertension, and A-fib status post multiple ablations currently on metoprolol and flecainide.  09/24/2022 - Underwent a laparoscopic right colectomy that was converted to open right colectomy along with a cholecystectomy - no significant complications and EBL was minimal at 80 cc.   10/20: Seen early morning by Dr. Dahlia Zuniga (general surgery) and was doing well.  Starting around 6 PM he began to have hypotension 70/41 and the hospitalist service was consulted. Pt was given total of 25 g of albumin, 2 L of normal saline, and started on glucagon gtt concern for beta blocker toxicity. His metoprolol and flecainide were held. BP improved. Mild AKI on labs Cr 1.93 down form admission 1.23, likely hypovolemia. D/c glucagon. Strict I&O. Continue hold beta blocker and flecainide, may reduce dose metoprolol on discharge.  10/21: BP soft but improved 115/53, 117/64. Cr improved 1.66. Hgb stable.  Rapid response overnight, A-fib RVR.  Transferred to stepdown, amiodarone infusion, minimal response, diltiazem infusion.  Dr. Humphrey Zuniga, cardiology, aware. Dr Nicolas Zuniga ordering CT chest/abdomen/pelvis, starting heparin drip. CT no PE, (+) Small bowel leak, certainly  no contrast extravasation and seems contained --> plan repeat CT, hold off on surgical intervention unless emergent, starting abx.  10/23: Patient went for repeat CT noting increased extravasation concerning for anastomotic leak.  Patient taken back to the OR and well no leak found, underwent bowel follow-through and washout.  Postop, patient in ICU and still intubated.  ABG notes severe respiratory acidosis.  Note that should patient remain on ventilator, will have critical care assumed medical management in conjunction with surgery. 10/24 for worsening acute renal failure, nephrology consulted.  Patient with bradycardia overnight and Cardizem and Precedex discontinued. 10/25 extubated, off vasopressors, weaned off insulin drip to subcutaneous insulin 10/28.  CT scan  showing.  Decreasing intraperitoneal free gas, free fluid on the left liver similar.  Multiple fluid collections in the mesentery. 10/30 interventional radiology performed a IR drainage procedure.  Patient ordered a unit of packed red blood cells on a hemoglobin of 7.2.  I started Lasix for 2 doses. 10/31--remained in ICU was intubated for 24 hours. 11/1--Extubated yday--Dobhoff feeding and ok to cont CLD. No bleeding. OK to start po eliquis (d/w dr Nicolas Zuniga). Will transfer out of ICU. Resume PT 11/2-- poor PO intake. Continuing G-tube. Resumed IV heparin drip due to drop in hemoglobin. Holding eliquis.     Assessment and Plan:  Adenocarcinoma of colon ([place]) Right colon cancer status post laparoscopic right colectomy and cholecystectomy on 09/24/2022. Intra-abdominal infection status post exploratory laparotomy with abdominal washout and diverting loop ileostomy on 10/23 10/29 Meropenem started with persistent leukocytosis and Zosyn discontinued. 10/30 interventional radiology did a CT aspiration of left anterior abdominal fluid collection and placed a drain for the  perihepatic fluid collection. --pt on IV meropenem and Micafungin--per ID   Septic shock (HCC) Septic shock has resolved.  Off pressors at this point.     Paroxysmal A-fib (HCC) Currently on amiodarone and metoprolol 11/1--started Eliquis (d/w surgery and [facility] cardiology) --11/2--back on IV heparin gtt   AKI (acute kidney injury) ([place]) Creatinine peaked at 4.61 on 09/30/2022 improved down to 1.43   Generalized anxiety disorder Cont lexapro   Microcytic anemia Surgical team ordered a unit of blood. Hgb 7.3   Weakness Continue working with physical therapy   Acute hypoxic respiratory failure [facility]) Patient required intubation during the hospital course.   Weaned to RA   Hyponatremia Last sodium normal range--resolved   Diabetes ([place]) Type 2 diabetes mellitus on long-acting insulin with being on Tube feeding Last  hemoglobin A1c 9.8.     Procedures:as above Family communication wife at bedside Consults :Gen surgery, ID, PCCM CODE STATUS: FULL DVT Prophylaxis :eliquis Level of care: Telemetry Medical Status is: Inpatient Remains inpatient appropriate because: complex post surgical complications    TOTAL TIME TAKING CARE OF THIS PATIENT: 35 minutes.  >50% time spent on counselling and coordination of care  Note: This dictation was prepared with Dragon dictation along with smaller phrase technology. Any transcriptional errors that result from this process are unintentional.  Fritzi Mandes M.D    [facility]   CC: Primary care physician; Hortencia Pilar, MD

## 2022-10-08 NOTE — TOC CM/SW Note (Signed)
RE: Nicolas Zuniga Date of Birth: 1956/02/08 Date: 10/08/2022   To Whom It May Concern:  Please be advised that the above-named patient will require a short-term nursing home stay - anticipated 30 days or less for rehabilitation and strengthening.  The plan is for return home.

## 2022-10-08 NOTE — Progress Notes (Signed)
[place] NOTE       Patient ID: CHRIST Nicolas Zuniga MRN: [id number] DOB/AGE: July 01, 1956 66 y.o.  Admit date: 09/24/2022 Referring Physician Sharion Settler, NP  Primary Physician Dr. Hortencia Pilar ([facility])  Primary Cardiologist Nehemiah Massed Reason for Consultation Af RVR  HPI: Nicolas Zuniga is a 52DPO with a PMH of paroxysmal atrial fibrillation (on flecainide, metoprolol, and Eliquis), HFpEF (EF >55, G1 DD 04/2022), type II diabetes, hypertension, recently diagnosed adenocarcinoma of the colon who presented to [facility] 09/24/2022 for and elective laparoscopic right colectomy ultimately converted to open right colectomy and cholecystectomy.  Postoperative course was complicated by hypotension and bradycardia the evening of postop day 1, concerning for beta-blocker toxicity for which he was started on glucagon infusion.  Rapid response was called overnight on 10/21 for atrial fibrillation with RVR and questionable VT, was started on amiodarone and diltiazem infusions with some heart rate response.  On 10/23 he underwent exploratory laparotomy, abdominal washout, and diverting loop ileostomy for intra-abdominal purulent fluid seen on CT abdomen pelvis.  Postoperatively, he remained intubated and requiring vasopressor support.  He converted to atrial flutter with variable conduction the evening of 10/23, Extubated 10/24. He has remained in AF with acceptable rate control on amiodarone and metoprolol. Heparin infusion paused the AM of 10/30 for bloody ostomy output. Required reintubation 10/30, fortunately extubated to [place] on 10/31.  Converted from AF to NSR the morning of 10/31.  Interval history: -remains in NSR, rate controlled. On full liquid diet. Room air.  -didn't sleep well last night, feels "ok." no shortness of breath, chest pain. Some palpitations when moving around. No significant abd pain.  -drift in Hgb overnight. Heparin ok to restart per surgery -pending dispo to  inpatient rehab   Past Medical History:  Diagnosis Date   Adenocarcinoma of colon ([place]) 09/08/2022   Cholelithiasis    Chronic gastritis    Colon polyps    Coronary artery calcification seen on CT scan    Diastolic dysfunction    a.) TTE 04/09/2022: EF >55%, triv TR, G1DD   Diverticulosis    Hypertension    Long term current use of anticoagulant    a.) apixaban   Nephrolithiasis    Paroxysmal A-fib ([place])    a.) CHA2DS2VASc = 3 (age, HTN, T2DM);  b.) s/p DCCV 07/09/2016 (100 J x1), 12/22/2018 (120 J x1), 08/18/2021 (120 J x1), 03/11/2022 (120 J x1), 03/31/2022 (120 J x1); c.) rate/rhythm maintained on oral flecanide + metoprolol succinate; chronically anticoagulated with apixaban   T2DM (type 2 diabetes mellitus) ([place])     Past Surgical History:  Procedure Laterality Date   CARDIOVERSION N/A 12/22/2018   Procedure: CARDIOVERSION (CATH LAB);  Surgeon: Corey Skains, MD;  Location: [facility] OR[facility];  Service: Cardiovascular;  Laterality: N/A;   CARDIOVERSION N/A 08/18/2021   Procedure: CARDIOVERSION;  Surgeon: Corey Skains, MD;  Location: [facility];  Service: Cardiovascular;  Laterality: N/A;   CARDIOVERSION N/A 03/11/2022   Procedure: CARDIOVERSION;  Surgeon: Corey Skains, MD;  Location: [facility];  Service: Cardiovascular;  Laterality: N/A;   CARDIOVERSION N/A 03/31/2022   Procedure: CARDIOVERSION;  Surgeon: Corey Skains, MD;  Location: [facility];  Service: Cardiovascular;  Laterality: N/A;   CHOLECYSTECTOMY N/A 09/24/2022   Procedure: LAPAROSCOPIC CHOLECYSTECTOMY;  Surgeon: Jules Husbands, MD;  Location: [facility];  Service: General;  Laterality: N/A;   COLONOSCOPY WITH PROPOFOL N/A 09/08/2022   Procedure: COLONOSCOPY WITH PROPOFOL;  Surgeon: Lucilla Lame, MD;  Location:  [facility];  Service: Endoscopy;  Laterality: N/A;   ELECTROPHYSIOLOGIC STUDY N/A 07/09/2016   Procedure: CARDIOVERSION;  Surgeon: Dionisio David, MD;  Location: [facility];  Service: Cardiovascular;   Laterality: N/A;   LAPAROSCOPIC RIGHT COLECTOMY N/A 09/24/2022   Procedure: LAPAROSCOPIC RIGHT COLECTOMY, RNFA to assist;  Surgeon: Jules Husbands, MD;  Location: [facility];  Service: General;  Laterality: N/A;   LAPAROTOMY N/A 09/28/2022   Procedure: EXPLORATORY LAPAROTOMY WITH OSTOMY CREATION;  Surgeon: Jules Husbands, MD;  Location: [facility];  Service: General;  Laterality: N/A;   TEE WITHOUT CARDIOVERSION N/A 07/09/2016   Procedure: TRANSESOPHAGEAL ECHOCARDIOGRAM (TEE);  Surgeon: Dionisio David, MD;  Location: [facility];  Service: Cardiovascular;  Laterality: N/A;   web fingers repaired     as a child    Medications Prior to Admission  Medication Sig Dispense Refill Last Dose   bisacodyl (DULCOLAX) 5 MG EC tablet Take all 4 tablets at 8 am the morning prior to your surgery. 4 tablet 0 09/23/2022   Cholecalciferol (VITAMIN D) 50 MCG (2000 UT) CAPS Take 2,000 Units by mouth daily.   Past Week   flecainide (TAMBOCOR) 100 MG tablet Take 100 mg by mouth 2 (two) times daily.   09/24/2022   glipiZIDE (GLUCOTROL XL) 10 MG 24 hr tablet Take 10 mg by mouth 2 (two) times daily.    09/24/2022   metFORMIN (GLUCOPHAGE-XR) 500 MG 24 hr tablet Take 500 mg by mouth daily with breakfast.   Past Month   metoprolol (LOPRESSOR) 100 MG tablet Take 100 mg by mouth 2 (two) times daily.   09/24/2022   metroNIDAZOLE (FLAGYL) 500 MG tablet Take 2 tablets at 8AM, take 2 tablets at [facility], and take 2 tablets at 8PM the day prior to your surgery 6 tablet 0 09/23/2022   Multiple Vitamins-Minerals (MULTIVITAMIN WITH MINERALS) tablet Take 1 tablet by mouth daily with breakfast.    Past Week   neomycin (MYCIFRADIN) 500 MG tablet Take 2 tablet at 8am, take 2 tablets at 2pm, and take 2 tablets at 8pm the day prior to your surgery 6 tablet 0 09/23/2022   Omega-3 Fatty Acids (FISH OIL) 1000 MG CAPS Take 1,000 mg by mouth daily.   Past Week   OZEMPIC, 1 MG/DOSE, 4 MG/3ML SOPN Inject 1 mg into the skin every Wednesday. Patient stopped  taking these medications   Past Month   polyethylene glycol powder (MIRALAX) 17 GM/SCOOP powder Mix full container in 64 ounces of Gatorade or other clear liquid. NO Red 238 g 0 09/23/2022   Turmeric 400 MG CAPS Take 400 mg by mouth daily.   Past Week   vitamin B-12 (CYANOCOBALAMIN) 100 MCG tablet Take 500 mcg by mouth daily.   Past Week   Vitamin E 400 units TABS Take 400 Units by mouth daily.   Past Week   apixaban (ELIQUIS) 5 MG TABS tablet Take 5 mg by mouth 2 (two) times daily.   09/21/2022   Coenzyme Q10 (COQ10) 100 MG CAPS Take 100 mg by mouth daily.   09/21/2022   Social History   Socioeconomic History   Marital status: Married    Spouse name: Not on file   Number of children: Not on file   Years of education: Not on file   Highest education level: Not on file  Occupational History   Not on file  Tobacco Use   Smoking status: Never    Passive exposure: Never   Smokeless tobacco: Never  Vaping  Use   Vaping Use: Never used  Substance and Sexual Activity   Alcohol use: No   Drug use: No   Sexual activity: Not on file  Other Topics Concern   Not on file  Social History Narrative   Lives with wife, [name], with on pet, cat.   Social Determinants of Health   Financial Resource Strain: Not on file  Food Insecurity: No Food Insecurity (09/24/2022)   Hunger Vital Sign    Worried About Running Out of Food in the Last Year: Never true    Ran Out of Food in the Last Year: Never true  Transportation Needs: No Transportation Needs (09/24/2022)   PRAPARE - Hydrologist (Medical): No    Lack of Transportation (Non-Medical): No  Physical Activity: Not on file  Stress: Not on file  Social Connections: Not on file  Intimate Partner Violence: Not At Risk (09/24/2022)   Humiliation, Afraid, Rape, and Kick questionnaire    Fear of Current or Ex-Partner: No    Emotionally Abused: No    Physically Abused: No    Sexually Abused: No    Family History   Problem Relation Age of Onset   Varicose Veins Mother    Heart disease Mother    Diabetes Mother    COPD Father      Vitals:   10/07/22 1921 10/08/22 0405 10/08/22 0453 10/08/22 0744  BP: (!) 144/62 (!) 146/67  (!) 147/69  Pulse: 85 81  80  Resp: '20 20  20  '$ Temp: 98 F (36.7 C) (!) 97.5 F (36.4 C)  98.1 F (36.7 C)  TempSrc: Oral Oral  Oral  SpO2: 97% 94%  95%  Weight:   126 kg   Height:        PHYSICAL EXAM General: acutely ill-appearing middle-aged Caucasian male, sitting at incline in hospital bed on 2C. Wife at bedside. HEENT:  Normocephalic, left nare with crusted scabbing. TPN R nare Neck:  No JVD.  Lungs: Normal work of breathing on room air. Decreased breath sounds without appreciable crackles or wheezes Heart: Regular rate and rhythm, normal S1 and S2 without gallops or murmurs.  Abdomen: soft, non-distended appearing. Gas and brown soft stool in colostomy bag. RUQ drain with serosanguinous drainage. Midline dry dressing in place.  Msk: Generalized weakness. Extremities: Warm and well perfused. No clubbing, cyanosis.  Generally edematous upper and lower extremities.  SCDs Neuro: Alert and oriented x3 Psych: Flat affect.  Answers questions appropriately  Labs: Basic Metabolic Panel: Recent Labs    10/06/22 0457 10/07/22 0510 10/08/22 0554  NA 139 141 142  K 4.8 4.7 4.0  CL 107 109 110  CO2 '26 28 27  '$ GLUCOSE 71 191* 142*  BUN 35* 34* 29*  CREATININE 1.52* 1.43* 1.20  CALCIUM 8.7* 8.8* 8.5*  MG 1.8 2.0  --   PHOS 4.0 3.2 2.3*    Liver Function Tests: Recent Labs    10/05/22 2207 10/06/22 0110  AST 21 20  ALT 11 11  ALKPHOS 63 64  BILITOT 0.5 0.5  PROT 6.2* 6.7  ALBUMIN 1.7* 2.2*    No results for input(s): "LIPASE", "AMYLASE" in the last 72 hours. CBC: Recent Labs    10/07/22 0510 10/08/22 0554  WBC 19.8* 20.6*  NEUTROABS 16.3* 16.7*  HGB 8.0* 7.3*  HCT 26.6* 24.1*  MCV 79.4* 80.3  PLT 422* 437*    Cardiac Enzymes: Recent Labs     10/05/22 2045 10/05/22 2207 10/06/22 0110  TROPONINIHS 23* 26* 25*    BNP: No results for input(s): "BNP" in the last 72 hours.  D-Dimer: No results for input(s): "DDIMER" in the last 72 hours. Hemoglobin A1C: No results for input(s): "HGBA1C" in the last 72 hours. Fasting Lipid Panel: Recent Labs    10/06/22 0457  TRIG 149    Thyroid Function Tests: No results for input(s): "TSH", "T4TOTAL", "T3FREE", "THYROIDAB" in the last 72 hours.  Invalid input(s): "FREET3"  Anemia Panel: No results for input(s): "VITAMINB12", "FOLATE", "FERRITIN", "TIBC", "IRON", "RETICCTPCT" in the last 72 hours.   Radiology: DG Abd 1 View  Result Date: 10/06/2022 CLINICAL DATA:  Nasogastric tube placement. EXAM: ABDOMEN - 1 VIEW COMPARISON:  10/05/2022 FINDINGS: The previously demonstrated nasogastric tube has been replaced with a feeding tube with its tip in the proximal stomach. Normal bowel gas pattern with small amount of contrast in the stomach and bowel. Lumbar and thoracic spine degenerative changes. Mildly progressive left basilar atelectasis. IMPRESSION: 1. Feeding tube tip in the proximal stomach. 2. Mildly progressive left basilar atelectasis. Electronically Signed   By: Claudie Revering M.D.   On: 10/06/2022 12:54   DG Abd 1 View  Result Date: 10/05/2022 CLINICAL DATA:  Orogastric tube placement EXAM: ABDOMEN - 1 VIEW COMPARISON:  None Available. FINDINGS: Orogastric tube tip overlies expected proximal to mid body of the stomach. Central venous catheter tip noted within the superior vena cava. Pigtail drainage catheter overlies the right hepatic lobe. Surgical clips are seen within the right mid abdomen. Visualized abdominal gas pattern is nonspecific due to a paucity of gas within the upper abdomen. No gross free intraperitoneal gas. IMPRESSION: 1. Orogastric tube tip within the proximal to mid body of the stomach. Electronically Signed   By: Fidela Salisbury M.D.   On: 10/05/2022 20:34   DG  Chest 1 View  Result Date: 10/05/2022 CLINICAL DATA:  Intubation EXAM: CHEST  1 VIEW COMPARISON:  Chest x-ray 09/28/2022 FINDINGS: Endotracheal tube tip is 3 cm above the carina. Right upper extremity PICC terminates over the distal SVC. Enteric tube extends below the diaphragm, distal tip is not included on the image. Lung volumes are low. There are patchy opacities in the left mid lung and right lung base similar to prior. No pleural effusion or pneumothorax. The cardiomediastinal silhouette is stable, the heart is enlarged. The osseous structures are unchanged. IMPRESSION: 1. Endotracheal tube tip is 3 cm above the carina. 2. Stable patchy opacities in the left mid lung and right lung base. Electronically Signed   By: Ronney Asters M.D.   On: 10/05/2022 18:51   CT GUIDED PERITONEAL/RETROPERITONEAL FLUID DRAIN BY PERC CATH  Result Date: 10/05/2022 INDICATION: Intra-abdominal fluid collections after prior right hemicolectomy and cholecystectomy for colon carcinoma and cholelithiasis. Persistent significant leukocytosis. EXAM: 1. CT-GUIDED ASPIRATION OF LEFT ANTERIOR PERITONEAL FLUID COLLECTION 2. CT-GUIDED ASPIRATION OF LEFT POSTEROLATERAL PERITONEAL FLUID COLLECTION 3. CT-GUIDED CATHETER DRAINAGE OF RIGHT PERIHEPATIC PERITONEAL FLUID COLLECTION 4. MEDICATIONS: None ANESTHESIA/SEDATION: Moderate (conscious) sedation was employed during this procedure. A total of Versed 2.0 mg and Fentanyl 100 mcg was administered intravenously by the radiology nurse. Total intra-service moderate Sedation Time: 52 minutes. The patient's level of consciousness and vital signs were monitored continuously by radiology nursing throughout the procedure under my direct supervision. COMPLICATIONS: None immediate. PROCEDURE: Informed written consent was obtained from the patient after a thorough discussion of the procedural risks, benefits and alternatives. All questions were addressed. Maximal Sterile Barrier Technique was utilized  including caps, mask, sterile  gowns, sterile gloves, sterile drape, hand hygiene and skin antiseptic. A timeout was performed prior to the initiation of the procedure. CT of the abdomen was performed in a supine position. Localizing scans were performed with grids and sites marked on the skin in the upper right abdomen and mid to lower left abdomen. From a left lateral approach, an 18 gauge trocar needle was advanced into an anterior left-sided peritoneal fluid collection. After confirming needle tip position, aspiration was performed via the trocar needle utilizing syringes. A sample was sent for culture analysis. The needle was removed after additional CT was performed. A second 18 gauge trocar needle was then advanced from a left-sided approach into a left posterolateral peritoneal fluid collection. Aspiration was performed through the trocar needle. A fluid sample was sent for culture analysis. The needle was removed after additional CT imaging. An 18 gauge trocar needle was advanced in the right upper abdomen into the lateral perihepatic space. After confirming needle tip position and aspiration of fluid, a guidewire was advanced into the fluid collection. The percutaneous tract was dilated over the guidewire and a 10 French percutaneous drainage catheter advanced into the lateral perihepatic space. Catheter position was confirmed by CT. A fluid sample was withdrawn and sent for culture analysis. The drainage catheter was attached to a suction bulb and secured at the skin with a Prolene retention suture and adhesive device. FINDINGS: Aspiration of the elongated left anterior fluid collection immediately deep to the anterior abdominal wall yielded thin, clear yellow fluid. A total volume of 90 mL of fluid was able to be aspirated from this region resulting in complete decompression of the collection by CT after aspiration. Aspiration of the teardrop shaped fluid collection along the lateral paracolic region of  the peritoneal cavity yielded darker yellow fluid which was slightly turbid, especially towards the end of aspiration. Aspiration yielded 50 mL of fluid resulting in near complete decompression of the collection by CT after aspiration. Aspiration of the right lateral perihepatic fluid collection yielded thin, clear yellow fluid. Given that this is the largest single area fluid present in the peritoneal cavity currently, it was elected to place a drainage catheter in this fluid collection. IMPRESSION: 1. CT-guided needle aspiration of left anterior peritoneal fluid collection yielded 90 mL of thin, clear yellow fluid resulting in complete decompression. A fluid sample from this collection was sent for culture analysis. 2. CT-guided needle aspiration left posterolateral paracolic fluid collection yielded 50 mL of slightly turbid yellow fluid resulting in near complete decompression. A fluid sample was sent for culture analysis. 3. CT-guided percutaneous catheter drainage was performed of the perihepatic fluid collection from a right lateral approach yielding thin, clear yellow fluid. A 10 French drainage catheter was placed and attached to suction bulb drainage. Electronically Signed   By: Aletta Edouard M.D.   On: 10/05/2022 16:22   CT GUIDED NEEDLE PLACEMENT  Result Date: 10/05/2022 INDICATION: Intra-abdominal fluid collections after prior right hemicolectomy and cholecystectomy for colon carcinoma and cholelithiasis. Persistent significant leukocytosis. EXAM: 1. CT-GUIDED ASPIRATION OF LEFT ANTERIOR PERITONEAL FLUID COLLECTION 2. CT-GUIDED ASPIRATION OF LEFT POSTEROLATERAL PERITONEAL FLUID COLLECTION 3. CT-GUIDED CATHETER DRAINAGE OF RIGHT PERIHEPATIC PERITONEAL FLUID COLLECTION 4. MEDICATIONS: None ANESTHESIA/SEDATION: Moderate (conscious) sedation was employed during this procedure. A total of Versed 2.0 mg and Fentanyl 100 mcg was administered intravenously by the radiology nurse. Total intra-service  moderate Sedation Time: 52 minutes. The patient's level of consciousness and vital signs were monitored continuously by radiology nursing throughout the procedure  under my direct supervision. COMPLICATIONS: None immediate. PROCEDURE: Informed written consent was obtained from the patient after a thorough discussion of the procedural risks, benefits and alternatives. All questions were addressed. Maximal Sterile Barrier Technique was utilized including caps, mask, sterile gowns, sterile gloves, sterile drape, hand hygiene and skin antiseptic. A timeout was performed prior to the initiation of the procedure. CT of the abdomen was performed in a supine position. Localizing scans were performed with grids and sites marked on the skin in the upper right abdomen and mid to lower left abdomen. From a left lateral approach, an 18 gauge trocar needle was advanced into an anterior left-sided peritoneal fluid collection. After confirming needle tip position, aspiration was performed via the trocar needle utilizing syringes. A sample was sent for culture analysis. The needle was removed after additional CT was performed. A second 18 gauge trocar needle was then advanced from a left-sided approach into a left posterolateral peritoneal fluid collection. Aspiration was performed through the trocar needle. A fluid sample was sent for culture analysis. The needle was removed after additional CT imaging. An 18 gauge trocar needle was advanced in the right upper abdomen into the lateral perihepatic space. After confirming needle tip position and aspiration of fluid, a guidewire was advanced into the fluid collection. The percutaneous tract was dilated over the guidewire and a 10 French percutaneous drainage catheter advanced into the lateral perihepatic space. Catheter position was confirmed by CT. A fluid sample was withdrawn and sent for culture analysis. The drainage catheter was attached to a suction bulb and secured at the skin  with a Prolene retention suture and adhesive device. FINDINGS: Aspiration of the elongated left anterior fluid collection immediately deep to the anterior abdominal wall yielded thin, clear yellow fluid. A total volume of 90 mL of fluid was able to be aspirated from this region resulting in complete decompression of the collection by CT after aspiration. Aspiration of the teardrop shaped fluid collection along the lateral paracolic region of the peritoneal cavity yielded darker yellow fluid which was slightly turbid, especially towards the end of aspiration. Aspiration yielded 50 mL of fluid resulting in near complete decompression of the collection by CT after aspiration. Aspiration of the right lateral perihepatic fluid collection yielded thin, clear yellow fluid. Given that this is the largest single area fluid present in the peritoneal cavity currently, it was elected to place a drainage catheter in this fluid collection. IMPRESSION: 1. CT-guided needle aspiration of left anterior peritoneal fluid collection yielded 90 mL of thin, clear yellow fluid resulting in complete decompression. A fluid sample from this collection was sent for culture analysis. 2. CT-guided needle aspiration left posterolateral paracolic fluid collection yielded 50 mL of slightly turbid yellow fluid resulting in near complete decompression. A fluid sample was sent for culture analysis. 3. CT-guided percutaneous catheter drainage was performed of the perihepatic fluid collection from a right lateral approach yielding thin, clear yellow fluid. A 10 French drainage catheter was placed and attached to suction bulb drainage. Electronically Signed   By: Aletta Edouard M.D.   On: 10/05/2022 16:22   CT GUIDED NEEDLE PLACEMENT  Result Date: 10/05/2022 INDICATION: Intra-abdominal fluid collections after prior right hemicolectomy and cholecystectomy for colon carcinoma and cholelithiasis. Persistent significant leukocytosis. EXAM: 1. CT-GUIDED  ASPIRATION OF LEFT ANTERIOR PERITONEAL FLUID COLLECTION 2. CT-GUIDED ASPIRATION OF LEFT POSTEROLATERAL PERITONEAL FLUID COLLECTION 3. CT-GUIDED CATHETER DRAINAGE OF RIGHT PERIHEPATIC PERITONEAL FLUID COLLECTION 4. MEDICATIONS: None ANESTHESIA/SEDATION: Moderate (conscious) sedation was employed during this procedure.  A total of Versed 2.0 mg and Fentanyl 100 mcg was administered intravenously by the radiology nurse. Total intra-service moderate Sedation Time: 52 minutes. The patient's level of consciousness and vital signs were monitored continuously by radiology nursing throughout the procedure under my direct supervision. COMPLICATIONS: None immediate. PROCEDURE: Informed written consent was obtained from the patient after a thorough discussion of the procedural risks, benefits and alternatives. All questions were addressed. Maximal Sterile Barrier Technique was utilized including caps, mask, sterile gowns, sterile gloves, sterile drape, hand hygiene and skin antiseptic. A timeout was performed prior to the initiation of the procedure. CT of the abdomen was performed in a supine position. Localizing scans were performed with grids and sites marked on the skin in the upper right abdomen and mid to lower left abdomen. From a left lateral approach, an 18 gauge trocar needle was advanced into an anterior left-sided peritoneal fluid collection. After confirming needle tip position, aspiration was performed via the trocar needle utilizing syringes. A sample was sent for culture analysis. The needle was removed after additional CT was performed. A second 18 gauge trocar needle was then advanced from a left-sided approach into a left posterolateral peritoneal fluid collection. Aspiration was performed through the trocar needle. A fluid sample was sent for culture analysis. The needle was removed after additional CT imaging. An 18 gauge trocar needle was advanced in the right upper abdomen into the lateral perihepatic  space. After confirming needle tip position and aspiration of fluid, a guidewire was advanced into the fluid collection. The percutaneous tract was dilated over the guidewire and a 10 French percutaneous drainage catheter advanced into the lateral perihepatic space. Catheter position was confirmed by CT. A fluid sample was withdrawn and sent for culture analysis. The drainage catheter was attached to a suction bulb and secured at the skin with a Prolene retention suture and adhesive device. FINDINGS: Aspiration of the elongated left anterior fluid collection immediately deep to the anterior abdominal wall yielded thin, clear yellow fluid. A total volume of 90 mL of fluid was able to be aspirated from this region resulting in complete decompression of the collection by CT after aspiration. Aspiration of the teardrop shaped fluid collection along the lateral paracolic region of the peritoneal cavity yielded darker yellow fluid which was slightly turbid, especially towards the end of aspiration. Aspiration yielded 50 mL of fluid resulting in near complete decompression of the collection by CT after aspiration. Aspiration of the right lateral perihepatic fluid collection yielded thin, clear yellow fluid. Given that this is the largest single area fluid present in the peritoneal cavity currently, it was elected to place a drainage catheter in this fluid collection. IMPRESSION: 1. CT-guided needle aspiration of left anterior peritoneal fluid collection yielded 90 mL of thin, clear yellow fluid resulting in complete decompression. A fluid sample from this collection was sent for culture analysis. 2. CT-guided needle aspiration left posterolateral paracolic fluid collection yielded 50 mL of slightly turbid yellow fluid resulting in near complete decompression. A fluid sample was sent for culture analysis. 3. CT-guided percutaneous catheter drainage was performed of the perihepatic fluid collection from a right lateral  approach yielding thin, clear yellow fluid. A 10 French drainage catheter was placed and attached to suction bulb drainage. Electronically Signed   By: Aletta Edouard M.D.   On: 10/05/2022 16:22   CT ABDOMEN PELVIS WO CONTRAST  Result Date: 10/03/2022 CLINICAL DATA:  Abdominal pain. Postop leukocytosis. Status post right hemicolectomy for colon mass on 09/24/2022. Patient  was re-explored 09/28/2022 and found to have purulent fluid in the right upper quadrant with possible Peri anastomotic abscess. EXAM: CT ABDOMEN AND PELVIS WITHOUT CONTRAST TECHNIQUE: Multidetector CT imaging of the abdomen and pelvis was performed following the standard protocol without IV contrast. RADIATION DOSE REDUCTION: This exam was performed according to the departmental dose-optimization program which includes automated exposure control, adjustment of the mA and/or kV according to patient size and/or use of iterative reconstruction technique. COMPARISON:  09/28/2022 FINDINGS: Lower chest: Dependent collapse/consolidation is seen in both lungs with small bilateral pleural effusions. Hepatobiliary: No suspicious focal abnormality in the liver on this study without intravenous contrast. Gallbladder surgically absent. No intrahepatic or extrahepatic biliary dilation. Pancreas: No focal mass lesion. No dilatation of the main duct. No intraparenchymal cyst. No peripancreatic edema. Spleen: No splenomegaly. No focal mass lesion. Adrenals/Urinary Tract: No adrenal nodule or mass. Kidneys unremarkable. No evidence for hydroureter. Bladder is decompressed by Foley catheter. Gas in the bladder lumen is compatible with the instrumentation. Stomach/Bowel: Oral contrast material is seen in the stomach. Duodenum is normally positioned as is the ligament of Treitz. No small bowel wall thickening. No small bowel dilatation. Loop ileostomy noted right abdomen. Right hemicolectomy. Transverse and left colon is opacified and decompressed. Surgical drain  is identified in the right abdomen adjacent to the ileocolic anastomosis. Vascular/Lymphatic: There is mild atherosclerotic calcification of the abdominal aorta without aneurysm. There is no gastrohepatic or hepatoduodenal ligament lymphadenopathy. No retroperitoneal or mesenteric lymphadenopathy. No pelvic sidewall lymphadenopathy. Reproductive: The prostate gland and seminal vesicles are unremarkable. Other: Small volume free fluid is seen adjacent to the liver, similar to prior. Scattered collections of fluid are seen in the mesentery and bilateral paracolic gutters, also similar to prior. Volume of intraperitoneal gas has decreased in the interval. 14.7 x 2.0 cm relatively well-defined Jeani Hawking shaped collection of fluid is identified anterior abdomen (image 53/2). Open midline wound evident. Musculoskeletal: No worrisome lytic or sclerotic osseous abnormality. IMPRESSION: 1. Decreasing intraperitoneal free gas since 09/28/2022 as expected. Free fluid around the liver is similar to prior with multiple fluid collections in the mesentery in fluid pooling in the para colic gutters bilaterally. No definite abscess at this time although lack of intravenous contrast lessens sensitivity for detection. Superinfection of any of the intraperitoneal fluid collections cannot be excluded on this study. 2. Right abdominal loop ileostomy in this patient with right hemicolectomy. 3. Dependent collapse/consolidation in both lungs with small bilateral pleural effusions. 4.  Aortic Atherosclerosis (ICD10-I70.0). Electronically Signed   By: Misty Stanley M.D.   On: 10/03/2022 15:48   ECHOCARDIOGRAM COMPLETE  Result Date: 09/29/2022    ECHOCARDIOGRAM REPORT   Patient Name:   WRANGLER PENNING Date of Exam: 09/29/2022 Medical Rec #:  [id number]     Height:       74.0 in Accession #:    [id number]    Weight:       260.1 lb Date of Birth:  1956-08-23      BSA:          2.430 m Patient Age:    32 years      BP:           126/74 mmHg Patient  Gender: M             HR:           83 bpm. Exam Location:  [facility] Procedure: 2D Echo, Color Doppler and Cardiac Doppler Indications:     Abnormal ECG R94.31  History:         Patient has prior history of Echocardiogram examinations, most                  recent 07/09/2016. Risk Factors:Hypertension and Diabetes.                  Paroxysmal Afib.  Sonographer:     Sherrie Sport Referring Phys:  [id number] Teressa Lower Diagnosing Phys: Serafina Royals MD  Sonographer Comments: Echo performed with patient supine and on artificial respirator, Technically challenging study due to limited acoustic windows and no subcostal window. IMPRESSIONS  1. Left ventricular ejection fraction, by estimation, is 65 to 70%. The left ventricle has normal function. The left ventricle has no regional wall motion abnormalities. Left ventricular diastolic parameters were normal.  2. Right ventricular systolic function is normal. The right ventricular size is normal.  3. The mitral valve is normal in structure. Trivial mitral valve regurgitation.  4. The aortic valve is normal in structure. Aortic valve regurgitation is not visualized. FINDINGS  Left Ventricle: Left ventricular ejection fraction, by estimation, is 65 to 70%. The left ventricle has normal function. The left ventricle has no regional wall motion abnormalities. The left ventricular internal cavity size was small. There is no left ventricular hypertrophy. Left ventricular diastolic parameters were normal. Right Ventricle: The right ventricular size is normal. No increase in right ventricular wall thickness. Right ventricular systolic function is normal. Left Atrium: Left atrial size was normal in size. Right Atrium: Right atrial size was normal in size. Pericardium: There is no evidence of pericardial effusion. Mitral Valve: The mitral valve is normal in structure. Trivial mitral valve regurgitation. Tricuspid Valve: The tricuspid valve is normal in structure. Tricuspid valve  regurgitation is trivial. Aortic Valve: The aortic valve is normal in structure. Aortic valve regurgitation is not visualized. Aortic valve mean gradient measures 2.0 mmHg. Aortic valve peak gradient measures 3.4 mmHg. Aortic valve area, by VTI measures 3.50 cm. Pulmonic Valve: The pulmonic valve was normal in structure. Pulmonic valve regurgitation is not visualized. Aorta: The aortic root and ascending aorta are structurally normal, with no evidence of dilitation. IAS/Shunts: No atrial level shunt detected by color flow Doppler.  LEFT VENTRICLE PLAX 2D LVIDd:         4.60 cm LVIDs:         3.30 cm LV PW:         1.20 cm LV IVS:        0.80 cm LVOT diam:     2.00 cm LV SV:         43 LV SV Index:   18 LVOT Area:     3.14 cm  RIGHT VENTRICLE RV Basal diam:  4.10 cm RV Mid diam:    3.80 cm RV S prime:     11.40 cm/s TAPSE (M-mode): 2.0 cm LEFT ATRIUM           Index        RIGHT ATRIUM           Index LA diam:      3.10 cm 1.28 cm/m   RA Area:     14.80 cm LA Vol (A2C): 40.0 ml 16.46 ml/m  RA Volume:   37.20 ml  15.31 ml/m LA Vol (A4C): 70.1 ml 28.84 ml/m  AORTIC VALVE AV Area (Vmax):    2.22 cm AV Area (Vmean):   2.37 cm AV Area (VTI):     3.50 cm AV Vmax:  91.60 cm/s AV Vmean:          67.100 cm/s AV VTI:            0.122 m AV Peak Grad:      3.4 mmHg AV Mean Grad:      2.0 mmHg LVOT Vmax:         64.70 cm/s LVOT Vmean:        50.700 cm/s LVOT VTI:          0.136 m LVOT/AV VTI ratio: 1.11  AORTA Ao Root diam: 3.20 cm MITRAL VALVE               TRICUSPID VALVE MV Area (PHT): 3.40 cm    TR Peak grad:   19.4 mmHg MV Decel Time: 223 msec    TR Vmax:        220.00 cm/s MV E velocity: 85.90 cm/s                            SHUNTS                            Systemic VTI:  0.14 m                            Systemic Diam: 2.00 cm Serafina Royals MD Electronically signed by Serafina Royals MD Signature Date/Time: 09/29/2022/12:21:30 PM    Final    DG Abd 1 View  Result Date: 09/28/2022 CLINICAL DATA:   Enteric catheter placement, postop EXAM: ABDOMEN - 1 VIEW COMPARISON:  09/26/2022 FINDINGS: Frontal view of the lower chest and upper abdomen demonstrates enteric catheter passing below diaphragm, tip projecting over the gastric antrum. Left basilar consolidation compatible with atelectasis. Bowel gas pattern is unremarkable. IMPRESSION: 1. Enteric catheter tip projecting over the gastric antrum. Electronically Signed   By: Randa Ngo M.D.   On: 09/28/2022 15:52   DG Chest Port 1 View  Result Date: 09/28/2022 CLINICAL DATA:  Postop, intubated EXAM: PORTABLE CHEST 1 VIEW COMPARISON:  09/27/2022 FINDINGS: Single frontal view of the chest demonstrates endotracheal tube overlying tracheal air column tip at level of thoracic inlet. Enteric catheter passes below diaphragm tip excluded by collimation. Right-sided PICC tip overlies the right atrium. Numerous cardiac leads overlie the central chest. The cardiac silhouette is unremarkable. Lung volumes are diminished, with left basilar consolidation most consistent with atelectasis. Trace left pleural effusion. No pneumothorax. No acute bony abnormalities. IMPRESSION: 1. Support devices as above. 2. Increasing left basilar consolidation, favor atelectasis over pneumonia. 3. Trace left pleural effusion. Electronically Signed   By: Randa Ngo M.D.   On: 09/28/2022 15:51   CT ABDOMEN PELVIS WO CONTRAST  Result Date: 09/28/2022 CLINICAL DATA:  Status post right hemicolectomy 09/24/2022. Anastomotic complication suspected. EXAM: CT ABDOMEN AND PELVIS WITHOUT CONTRAST TECHNIQUE: Multidetector CT imaging of the abdomen and pelvis was performed following the standard protocol without IV contrast. RADIATION DOSE REDUCTION: This exam was performed according to the departmental dose-optimization program which includes automated exposure control, adjustment of the mA and/or kV according to patient size and/or use of iterative reconstruction technique. COMPARISON:   09/27/2022 FINDINGS: Lower chest: Bibasilar atelectasis noted with tiny bilateral pleural effusions. Hepatobiliary: No suspicious focal abnormality in the liver on this study without intravenous contrast. Gallbladder is surgically absent. No intrahepatic or extrahepatic biliary dilation. Pancreas: No focal mass lesion. No  dilatation of the main duct. No intraparenchymal cyst. No peripancreatic edema. Spleen: No splenomegaly. No focal mass lesion. Adrenals/Urinary Tract: No adrenal nodule or mass. Contrast in the kidneys compatible with residual from yesterday's infuse scan. Small cyst noted left kidney. No evidence for hydroureter. The urinary bladder appears normal for the degree of distention. Stomach/Bowel: Stomach is distended with gas and fluid. Duodenum is normally positioned as is the ligament of Treitz. No small bowel wall thickening. No small bowel dilatation. The distal ileum at the ileocolic anastomosis in the proximal transverse colon at the anastomosis is well opacified with enteric contrast material. While there is no evidence for gross contrast extravasation at the level of the anastomosis, the small fluid collection identified on yesterday's study has increased in attenuation in the interval (26 Hounsfield units previously versus 67 Hounsfield units today). This increase in attenuation is concerning for some trace spill of contrast from the anastomosis into the collection although blood products or infectious debris could cause increase in attenuation. This finding is associated with a slight increase in fluid volume in the right paracolic gutter. Diverticular changes are seen in the left colon as before. Vascular/Lymphatic: There is mild atherosclerotic calcification of the abdominal aorta without aneurysm. There is no gastrohepatic or hepatoduodenal ligament lymphadenopathy. No retroperitoneal or mesenteric lymphadenopathy. No pelvic sidewall lymphadenopathy. Reproductive: The hypoattenuating  collection seen previously in the prostate gland are less prominent today. Other: Free fluid and free gas in the peritoneal cavity is again noted. Free gas is not unexpected 4 days out from surgery. As noted above, the volume of fluid in the right paracolic gutter is slightly increased in the interval. Musculoskeletal: Gas in the midline subcutaneous tissues deep to the staple line has increased in the interval No worrisome lytic or sclerotic osseous abnormality. IMPRESSION: 1. While there is no evidence for gross enteric contrast extravasation at the level of the ileocolic anastomosis, the small fluid collection adjacent to the anastomosis identified on yesterday's study has increased in attenuation in the interval. This increase in attenuation is concerning for some trace spill of contrast from the anastomosis into the collection. 2. Slight increase in fluid volume in the right paracolic gutter with similar fluid volume around the liver and in the left paracolic gutter. Collections of interloop mesenteric fluid are not substantially changed. 3. Free gas in the peritoneal cavity is not unexpected 4 days out from surgery. 4. Gas in the midline subcutaneous tissues deep to the staple line has increased in the interval. 5. Contrast in the kidneys compatible with residual from yesterday's infused scan. 6. Bibasilar atelectasis with tiny bilateral pleural effusions. 7. Aortic Atherosclerosis (ICD10-I70.0). Findings were Dr. Dahlia Byes at approximately 1105 hours on 09/28/2022. Electronically Signed   By: Misty Stanley M.D.   On: 09/28/2022 11:22   DG Chest Port 1 View  Result Date: 09/27/2022 CLINICAL DATA:  PICC line placement. EXAM: PORTABLE CHEST 1 VIEW COMPARISON:  Abdominal series 09/26/2022. CT abdomen and pelvis 09/27/2022 FINDINGS: Shallow inspiration with atelectasis in the lung bases. Cardiac enlargement. No airspace disease or consolidation in the lungs. A right PICC line has been placed with tip over the  cavoatrial junction. No pneumothorax. Pneumoperitoneum is demonstrated below the right hemidiaphragm as seen on prior studies, likely postoperative. IMPRESSION: 1. Right PICC line appears in satisfactory position. 2. Shallow inspiration with atelectasis in the lung bases. Cardiac enlargement. 3. Pneumoperitoneum as seen on prior studies, probably postoperative. Electronically Signed   By: Lucienne Capers M.D.   On: 09/27/2022 19:25  [place] EKG SITE RITE  Result Date: 09/27/2022 If Site Rite image not attached, placement could not be confirmed due to current cardiac rhythm.  CT ABDOMEN PELVIS W CONTRAST  Result Date: 09/27/2022 CLINICAL DATA:  66 year old male with RIGHT colectomy for adenocarcinoma and cholecystectomy performed on 09/24/2022. Hypotension and abdominal discomfort. EXAM: CT ABDOMEN AND PELVIS WITH CONTRAST TECHNIQUE: Multidetector CT imaging of the abdomen and pelvis was performed using the standard protocol following bolus administration of intravenous contrast. RADIATION DOSE REDUCTION: This exam was performed according to the departmental dose-optimization program which includes automated exposure control, adjustment of the mA and/or kV according to patient size and/or use of iterative reconstruction technique. CONTRAST:  16m OMNIPAQUE IOHEXOL 350 MG/ML SOLN COMPARISON:  09/17/2022 CT FINDINGS: Lower chest: Trace bilateral pleural effusions and bibasilar atelectasis noted. Hepatobiliary: The liver is unremarkable. The patient is status post cholecystectomy. There is no evidence of intrahepatic or extrahepatic biliary dilatation. Pancreas: A small amount of ill-defined fluid anterior to the pancreas is noted but the remainder of the pancreas is unremarkable. Spleen: Unremarkable Adrenals/Urinary Tract: No significant abnormalities of the kidneys, adrenal glands or bladder noted. No evidence of hydronephrosis. Stomach/Bowel: RIGHT colectomy and ileo colonic anastomosis noted. There is a  small amount of ill-defined high density fluid along the suture line (images 35-39: Series 4), which may represent a small amount of extravasated contrast/leak. There is a small amount of ascites within the abdomen and pelvis with small to moderate amount of pneumoperitoneum within the abdomen. There is no evidence of focal collection/defined abscess. There is no evidence of bowel obstruction. No definite bowel wall thickening is identified. Vascular/Lymphatic: Aortic atherosclerosis. No enlarged abdominal or pelvic lymph nodes. Reproductive: 2 low-density areas within the prostate gland are noted, measuring 1.7 cm on the LEFT and 1 cm on the RIGHT. Other: Ill-defined stranding within the abdomen and pelvis noted likely recent surgical changes. Musculoskeletal: No acute or suspicious bony abnormalities are identified. IMPRESSION: 1. Ill-defined slightly high density fluid along the ileocolic suture line, which may represent a small amount of extravasated contrast/leak. Small to moderate amount of pneumoperitoneum and small amount of ascites within the abdomen and pelvis, not unexpected given surgery but nonspecific. No evidence of focal collection/defined abscess. 2. Trace bilateral pleural effusions and bibasilar atelectasis. 3. 2 low-density areas within the prostate gland, nonspecific but correlate with PSA. 4.  Aortic Atherosclerosis (ICD10-I70.0). Critical Value/emergent results discussed by phone at the time of interpretation on 09/27/2022 at 1:50 pm to provider DIEGO PABON , who verbally acknowledged these results. Electronically Signed   By: JMargarette CanadaM.D.   On: 09/27/2022 13:51   CT Angio Chest Pulmonary Embolism (PE) W or WO Contrast  Result Date: 09/27/2022 CLINICAL DATA:  Shortness of breath. EXAM: CT ANGIOGRAPHY CHEST WITH CONTRAST TECHNIQUE: Multidetector CT imaging of the chest was performed using the standard protocol during bolus administration of intravenous contrast. Multiplanar CT image  reconstructions and MIPs were obtained to evaluate the vascular anatomy. RADIATION DOSE REDUCTION: This exam was performed according to the departmental dose-optimization program which includes automated exposure control, adjustment of the mA and/or kV according to patient size and/or use of iterative reconstruction technique. CONTRAST:  1017mOMNIPAQUE IOHEXOL 350 MG/ML SOLN COMPARISON:  September 17, 2022. FINDINGS: Cardiovascular: Satisfactory opacification of the pulmonary arteries to the segmental level. No evidence of pulmonary embolism. Normal heart size. No pericardial effusion. Mediastinum/Nodes: No enlarged mediastinal, hilar, or axillary lymph nodes. Thyroid gland, trachea, and esophagus demonstrate no significant findings. Lungs/Pleura: No pneumothorax  is noted. Minimal bilateral pleural effusions are noted with adjacent subsegmental atelectasis. Upper Abdomen: Pneumoperitoneum is noted in the visualized portion of upper abdomen consistent with the reported history of recent laparoscopic right colectomy and cholecystectomy. Musculoskeletal: No chest wall abnormality. No acute or significant osseous findings. Review of the MIP images confirms the above findings. IMPRESSION: No definite evidence of pulmonary embolus. Minimal bilateral pleural effusions are noted with adjacent subsegmental atelectasis. Pneumoperitoneum is noted in visualized portion of upper abdomen consistent with reported history of recent laparoscopic right colectomy and cholecystectomy. Electronically Signed   By: Marijo Conception M.D.   On: 09/27/2022 13:38   DG ABD ACUTE 2+V W 1V CHEST  Result Date: 09/26/2022 CLINICAL DATA:  Ileus following open right colectomy on 09/24/2022 EXAM: DG ABDOMEN ACUTE WITH 1 VIEW CHEST COMPARISON:  09/17/2022 FINDINGS: Gaseous distension of the stomach. Air is seen within the colon. No dilated loops of small bowel. Small-moderate volume pneumoperitoneum. Numerous surgical clips within the right  hemiabdomen. Midline abdominal skin staples. Heart size within normal limits. Low lung volumes. Trace left pleural effusion with left basilar atelectasis. No pneumothorax. IMPRESSION: 1. Small-moderate volume pneumoperitoneum, likely related to recent surgery. 2. Gaseous distension of the stomach. No dilated loops of small bowel to suggest obstruction. Electronically Signed   By: Davina Poke D.O.   On: 09/26/2022 10:57   CT Chest W Contrast  Result Date: 09/18/2022 CLINICAL DATA:  Colon cancer diagnosed on 09/08/2022. Elevated CEA. Staging evaluation. EXAM: CT CHEST, ABDOMEN, AND PELVIS WITH CONTRAST TECHNIQUE: Multidetector CT imaging of the chest, abdomen and pelvis was performed following the standard protocol during bolus administration of intravenous contrast. RADIATION DOSE REDUCTION: This exam was performed according to the departmental dose-optimization program which includes automated exposure control, adjustment of the mA and/or kV according to patient size and/or use of iterative reconstruction technique. CONTRAST:  125m OMNIPAQUE IOHEXOL 300 MG/ML  SOLN COMPARISON:  Abdomen and pelvis CT dated 03/07/2020 FINDINGS: CT CHEST FINDINGS Cardiovascular: Minimal coronary artery calcification. Normal sized heart. Mediastinum/Nodes: No enlarged mediastinal, hilar, or axillary lymph nodes. Thyroid gland, trachea, and esophagus demonstrate no significant findings. Lungs/Pleura: Minimal linear atelectasis or scarring at the left lung base. 5 x 3 mm left lower lobe nodule with a mean diameter of 4 mm on image number 100/5. Taking differences in slice thickness into account, this demonstrates little if any change since 03/07/2020. There is also a small calcified granuloma more superiorly in the left lower lobe on image number 77/5. On image number 97/5 and coronal image number 58/6, there is a 4 x 2 mm oval nodular density with a mean diameter of 3 mm. It is difficult determine if this is a lung nodule or  prominent vessel branch point. No other lung nodules seen. No pleural fluid. Musculoskeletal: Thoracic and lower cervical spine degenerative changes. No evidence of bony metastatic disease. CT ABDOMEN PELVIS FINDINGS Hepatobiliary: Multiple small noncalcified gallstones in the dependent portion of the gallbladder measuring up 5 mm in maximum diameter each. No gallbladder wall thickening or pericholecystic fluid. Unremarkable liver. No liver masses are seen. Pancreas: Unremarkable. No pancreatic ductal dilatation or surrounding inflammatory changes. Spleen: Normal in size without focal abnormality. Adrenals/Urinary Tract: Normal appearing adrenal glands. Small bilateral renal cysts. These do not need imaging follow-up. Poorly distended urinary bladder. No ureteral abnormalities seen. Stomach/Bowel: Interval concentric mass involving the right colon and ileocecal valve measuring 7.3 cm in length on coronal image number 88/6 with a maximum wall thickness of 1.7 cm  on the same image. This mass is confluent with an immediately adjacent mass medially with central low density, measuring 3.8 x 3.0 cm on image number 92/3 and 4.1 cm in length on coronal image number 83/6. There are multiple adjacent lymph node medial to these masses. The largest has a short axis diameter of 2.4 cm on image number 86/3. Multiple descending and sigmoid colon diverticula. Mild wall thickening involving the terminal ileum at the ileocecal valve. The remainder of the small bowel is unremarkable. Normal-appearing appendix and stomach. Vascular/Lymphatic: Mild atheromatous arterial calcifications without aneurysm. Multiple enlarged mesenteric lymph nodes medial to the right colon mass, as described above. No other enlarged lymph nodes seen. Reproductive: Prostate is unremarkable. Other: No abdominal wall hernia or abnormality. No abdominopelvic ascites. Musculoskeletal: Lumbar spine degenerative changes. Mild bilateral hip degenerative changes. No  evidence of bony metastatic disease. IMPRESSION: 1. Interval concentric mass involving the right colon, ileocecal valve and distal terminal ileum, compatible with the patient's known colon carcinoma. No associated obstruction. 2. Adjacent mass with central low density, compatible with local extension of necrotic tumor or an enlarged, necrotic metastatic lymph node. 3. Multiple adjacent enlarged mesenteric lymph nodes medial to these masses, compatible with metastatic adenopathy. 4. Colonic diverticulosis. 5. Cholelithiasis. 6. 4 mm mean diameter left lower lobe nodule possible 3 mm mean diameter right middle lobe nodule. These are felt to have a low likelihood of representing metastatic disease and could be followed on subsequent examinations. Electronically Signed   By: Claudie Revering M.D.   On: 09/18/2022 17:04   CT Abdomen Pelvis W Contrast  Result Date: 09/18/2022 CLINICAL DATA:  Colon cancer diagnosed on 09/08/2022. Elevated CEA. Staging evaluation. EXAM: CT CHEST, ABDOMEN, AND PELVIS WITH CONTRAST TECHNIQUE: Multidetector CT imaging of the chest, abdomen and pelvis was performed following the standard protocol during bolus administration of intravenous contrast. RADIATION DOSE REDUCTION: This exam was performed according to the departmental dose-optimization program which includes automated exposure control, adjustment of the mA and/or kV according to patient size and/or use of iterative reconstruction technique. CONTRAST:  179m OMNIPAQUE IOHEXOL 300 MG/ML  SOLN COMPARISON:  Abdomen and pelvis CT dated 03/07/2020 FINDINGS: CT CHEST FINDINGS Cardiovascular: Minimal coronary artery calcification. Normal sized heart. Mediastinum/Nodes: No enlarged mediastinal, hilar, or axillary lymph nodes. Thyroid gland, trachea, and esophagus demonstrate no significant findings. Lungs/Pleura: Minimal linear atelectasis or scarring at the left lung base. 5 x 3 mm left lower lobe nodule with a mean diameter of 4 mm on image  number 100/5. Taking differences in slice thickness into account, this demonstrates little if any change since 03/07/2020. There is also a small calcified granuloma more superiorly in the left lower lobe on image number 77/5. On image number 97/5 and coronal image number 58/6, there is a 4 x 2 mm oval nodular density with a mean diameter of 3 mm. It is difficult determine if this is a lung nodule or prominent vessel branch point. No other lung nodules seen. No pleural fluid. Musculoskeletal: Thoracic and lower cervical spine degenerative changes. No evidence of bony metastatic disease. CT ABDOMEN PELVIS FINDINGS Hepatobiliary: Multiple small noncalcified gallstones in the dependent portion of the gallbladder measuring up 5 mm in maximum diameter each. No gallbladder wall thickening or pericholecystic fluid. Unremarkable liver. No liver masses are seen. Pancreas: Unremarkable. No pancreatic ductal dilatation or surrounding inflammatory changes. Spleen: Normal in size without focal abnormality. Adrenals/Urinary Tract: Normal appearing adrenal glands. Small bilateral renal cysts. These do not need imaging follow-up. Poorly distended urinary  bladder. No ureteral abnormalities seen. Stomach/Bowel: Interval concentric mass involving the right colon and ileocecal valve measuring 7.3 cm in length on coronal image number 88/6 with a maximum wall thickness of 1.7 cm on the same image. This mass is confluent with an immediately adjacent mass medially with central low density, measuring 3.8 x 3.0 cm on image number 92/3 and 4.1 cm in length on coronal image number 83/6. There are multiple adjacent lymph node medial to these masses. The largest has a short axis diameter of 2.4 cm on image number 86/3. Multiple descending and sigmoid colon diverticula. Mild wall thickening involving the terminal ileum at the ileocecal valve. The remainder of the small bowel is unremarkable. Normal-appearing appendix and stomach.  Vascular/Lymphatic: Mild atheromatous arterial calcifications without aneurysm. Multiple enlarged mesenteric lymph nodes medial to the right colon mass, as described above. No other enlarged lymph nodes seen. Reproductive: Prostate is unremarkable. Other: No abdominal wall hernia or abnormality. No abdominopelvic ascites. Musculoskeletal: Lumbar spine degenerative changes. Mild bilateral hip degenerative changes. No evidence of bony metastatic disease. IMPRESSION: 1. Interval concentric mass involving the right colon, ileocecal valve and distal terminal ileum, compatible with the patient's known colon carcinoma. No associated obstruction. 2. Adjacent mass with central low density, compatible with local extension of necrotic tumor or an enlarged, necrotic metastatic lymph node. 3. Multiple adjacent enlarged mesenteric lymph nodes medial to these masses, compatible with metastatic adenopathy. 4. Colonic diverticulosis. 5. Cholelithiasis. 6. 4 mm mean diameter left lower lobe nodule possible 3 mm mean diameter right middle lobe nodule. These are felt to have a low likelihood of representing metastatic disease and could be followed on subsequent examinations. Electronically Signed   By: Claudie Revering M.D.   On: 09/18/2022 17:04    ECHO 04/2022 EF >55%, G1 DD with normal LV and RV systolic function  TELEMETRY reviewed by me (LT) 10/08/2022 : NSR rate 70s  EKG reviewed by me: 09/25/2022 sinus rhythm rate 60s  Data reviewed by me (LT) 10/08/2022: Surgery progress note, hospitalist progress note, critical care progress note, CBC, BMP, I's and O's, vitals, telemetry  Principal Problem:   Adenocarcinoma of colon ([place]) Active Problems:   Diabetes ([place])   Essential hypertension   Paroxysmal A-fib (HCC)   S/P right colectomy   S/P partial resection of colon   Hyponatremia   AKI (acute kidney injury) ([place])   Postoperative intra-abdominal abscess   Persistent atrial fibrillation (HCC)   Septic shock (HCC)    Diabetic ketoacidosis without coma associated with type 2 diabetes mellitus (HCC)   Generalized anxiety disorder   Acute hypoxic respiratory failure (HCC)   Weakness   Microcytic anemia   Intra-abdominal abscess ([place])    ASSESSMENT AND PLAN:  Nicolas Zuniga is a 61yoM with a PMH of paroxysmal atrial fibrillation (on flecainide, metoprolol, and Eliquis), HFpEF (EF >55, G1 DD 04/2022), type II diabetes, hypertension, recently diagnosed adenocarcinoma of the colon who presented to [facility] 09/24/2022 for and elective laparoscopic right colectomy ultimately converted to open right colectomy and cholecystectomy.  Postoperative course was complicated by hypotension and bradycardia the evening of postop day 1, concerning for beta-blocker toxicity for which he was started on glucagon infusion.  Rapid response was called overnight on 10/21 for atrial fibrillation with RVR and questionable VT, was started on amiodarone and diltiazem infusions with some heart rate response.  On 10/23 he underwent exploratory laparotomy, abdominal washout, and diverting loop ileostomy for intra-abdominal purulent fluid seen on CT abdomen pelvis.  Postoperatively, he  remained intubated and requiring vasopressor support.  He converted to atrial flutter with variable conduction the evening of 10/23, Extubated 10/24. He has remained in AF with acceptable rate control on amiodarone and metoprolol. Heparin infusion paused the AM of 10/30 for bloody ostomy output. Required reintubation 10/30, fortunately extubated to [place] on 10/31.  Converted from AF to NSR the morning of 10/31.  #Acute hypoxic & hypercapnic respiratory failure  -reintubated d/t increased wob and hypercapnia on 10/30, transferred to the ICU, extubated to nasal cannula 10/31. -encouraged continued use of incentive spirometer   #Adenocarcinoma of the colon s/p open R colectomy & CCY 10/19 #S/p exploratory laparotomy, abdominal washout, and diverting loop ileostomy  10/23 #Intra-abdominal abscess with ?Septic shock -Management per primary team and surgery -ID also involved to help guide treatment  #Hypotension, resolved #Paroxysmal atrial fibrillation with RVR, converted to NSR 10/31 Was previously on flecainide, metoprolol, and Eliquis which have all been held after rapid response on 10/21.  His heart rates were difficult to control, and was in A-fib in the 120s at rest with paroxysms to the 150s throughout the day of 10/23 and converted to atrial flutter with controlled ventricular response in the evening.  Bradycardic overnight so diltiazem and Precedex infusions were discontinued on 10/24.  Since 10/25 he has remained in atrial fibrillation with acceptable rate control in the 90s to low 100s primarily. Required reintubation and sedation 10/30, fortunately extubated and off vasopressor support 10/31 -Continue to treat causes of increased adrenergic tone including pain, infection, hypoxia -Monitor and replete electrolytes to maintain a K >4, mag >2 -S/p amiodarone infusion, continue amiodarone 200 mg daily  -do not restart flecainide at discharge -agree with increasing metoprolol tartrate to '50mg'$  twice daily  -Revisit ablation on an outpatient basis with Dr. Mylinda Latina. -CHA2DS2-VASc 3 (age, htn, dm2).   -Restarting heparin infusion today, okay by surgery -restart Eliquis 5 mg twice daily at discharge or once no further procedures are planned if okay from a surgical standpoint.  #Chronic HFpEF (EF 65-70% 09/2022, previous >55% 04/2022) Net negative ~3.9 L today, appears somewhat volume up.  -s/p IV Lasix 40 mg once on 10/30, '20mg'$  IV 10/31 & 11/1. Anticipate need for further Lasix dosing  #Acute renal failure, resolved Much improved with diuresis.  AKI likely ATN with significant hypotension requiring vasopressor support.   -Nephrology consulted by primary team, appreciate their assistance  #Acute blood loss anemia Hgb drift down overnight on 10/30  from 8.0 to 6.1, maroon ostomy output, s/p 1 unit of PRBCs.  -Hgb drift down again from 8.0 to 7.3 (reported scant blood from his bottom), continue to check H/H daily  Dispo planned to inpatient rehab, bed search in process. No further cardiac diagnostics necessary, we will sign off. Please reengage if needed.   This patient's plan of care was discussed and created with Dr. Clayborn Bigness and he is in agreement.  Signed: Tristan Schroeder , PA-C 10/08/2022, 10:38 AM [facility]

## 2022-10-08 NOTE — Plan of Care (Signed)
  Problem: Education: Goal: Ability to describe self-care measures that may prevent or decrease complications (Diabetes Survival Skills Education) will improve Outcome: Progressing Goal: Individualized Educational Video(s) Outcome: Progressing   Problem: Fluid Volume: Goal: Ability to maintain a balanced intake and output will improve Outcome: Progressing   Problem: Health Behavior/Discharge Planning: Goal: Ability to identify and utilize available resources and services will improve Outcome: Progressing Goal: Ability to manage health-related needs will improve Outcome: Progressing

## 2022-10-08 NOTE — Consult Note (Signed)
ANTICOAGULATION CONSULT NOTE  Pharmacy Consult for Heparin Infusion Indication: atrial fibrillation  Patient Measurements: Height: '6\' 2"'$  (188 cm) Weight: 126 kg (277 lb 12.5 oz) IBW/kg (Calculated) : 82.2 Heparin Dosing Weight: 107.3 kg  Labs: Recent Labs    10/05/22 2045 10/05/22 2207 10/06/22 0013 10/06/22 0110 10/06/22 0457 10/06/22 1135 10/07/22 0510 10/07/22 1801 10/08/22 0554  HGB  --   --    < > 8.8* 8.2* 8.0* 8.0*  --  7.3*  HCT  --   --    < > 29.1* 27.3* 25.7* 26.6*  --  24.1*  PLT  --   --   --   --  499*  --  422*  --  437*  HEPARINUNFRC  --   --   --   --   --   --   --  <0.10*  --   CREATININE  --  1.36*  --  1.45* 1.52*  --  1.43*  --  1.20  TROPONINIHS 23* 26*  --  25*  --   --   --   --   --    < > = values in this interval not displayed.     Estimated Creatinine Clearance: 85.4 mL/min (by C-G formula based on SCr of 1.2 mg/dL).   Medical History: Past Medical History:  Diagnosis Date   Adenocarcinoma of colon ([place]) 09/08/2022   Cholelithiasis    Chronic gastritis    Colon polyps    Coronary artery calcification seen on CT scan    Diastolic dysfunction    a.) TTE 04/09/2022: EF >55%, triv TR, G1DD   Diverticulosis    Hypertension    Long term current use of anticoagulant    a.) apixaban   Nephrolithiasis    Paroxysmal A-fib ([place])    a.) CHA2DS2VASc = 3 (age, HTN, T2DM);  b.) s/p DCCV 07/09/2016 (100 J x1), 12/22/2018 (120 J x1), 08/18/2021 (120 J x1), 03/11/2022 (120 J x1), 03/31/2022 (120 J x1); c.) rate/rhythm maintained on oral flecanide + metoprolol succinate; chronically anticoagulated with apixaban   T2DM (type 2 diabetes mellitus) ([place])    Assessment: Nicolas Zuniga is a 66 y.o. male presenting for laparoscopic R colectomy and cholecystectomy. PMH significant for recently diagnosed adenocarcinoma of the colon, DM, HTN, Afib on metoprolol, flecainide, and apixaban. Patient was on [facility] PTA per chart review, last dose of apixaban PTA reported  to be on 10/16. Pharmacy has been consulted to initiate and manage heparin infusion. Now s/p procedure.  Heparin was re-started 11/1 and then held given possible hematochezia. No further signs of bleeding. Worsened anemia 11/2 AM. Per discussion with interdisciplinary team 11/2, will re-challenge heparin again.  Goal of Therapy:  Heparin level 0.3 - 0.5 units/ml (no bolus protocol) Monitor platelets by anticoagulation protocol: Yes   Plan:  --No bolus protocol --Restart heparin at 1800 units/hr (~16 units/kg/hr). Required high dose of heparin previously, therefore expect will need to increase rate. Restarting cautiously given previous bleeding at drain site. --HL in 6 hours from initiation --CBC daily  Thank you for allowing pharmacy to be a part of this patient's care.  Benita Gutter 10/08/2022 10:18 AM

## 2022-10-08 NOTE — Progress Notes (Signed)
[place] Hospital Day(s): [place] op day(s): 10 Days Post-Op.   Interval History:  Patient seen and examined Patient reports he is feeling somewhat better Abdominal soreness No fever, chills, nausea, emesis  His leukocytosis remains elevated but stable; 20.6K Hgb to 7.3 Renal function normalized; sCr - 1.20; UO - 1900 ccs Mild hypophosphatemia to 2.3 o/w no electrolyte derangements  Surgical drain with minimal output; serous Percutaneous drain (RUQ);50 ccs; serous Cx x3 from (10/30) are without growth He continues on Meropenem and micafungin; ID on-board He continues to have ostomy function; 300 ccs recorded  On enteric feedings On CLD  Vital signs in last 24 hours: [min-max] current  Temp:  [97.5 F (36.4 C)-98.4 F (36.9 C)] 98.1 F (36.7 C) (11/02 0744) Pulse Rate:  [80-86] 80 (11/02 0744) Resp:  [16-26] 20 (11/02 0744) BP: (133-162)/(62-77) 147/69 (11/02 0744) SpO2:  [94 %-97 %] 95 % (11/02 0744) Weight:  [161 kg] 126 kg (11/02 0453)     Height: '6\' 2"'$  (188 cm) Weight: 126 kg BMI (Calculated): 35.65   Intake/Output last 2 shifts:  11/01 0701 - 11/02 0700 In: 988.8 [P.O.:840; NG/GT:148.8] Out: 2250 [Urine:1900; Drains:50; Stool:300]   Physical Exam:  Constitutional: Alert, awake, NAD Respiratory: Breathing non-labor, no respiratory distress Cardiac: regular rate, regular rhythm  Gastrointestinal: Soft, abdominal soreness, non-distended, no rebound/guarding. Surgical drain with serosanguinous output. 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 Newly placed drain in RUQ; serous fluid Integumentary: Midline laparotomy healing via secondary intention, no erythema, no drainage, no evidence of fistula  Labs:     Latest Ref Rng & Units 10/08/2022    5:54 AM 10/07/2022    5:10 AM 10/06/2022   11:35 AM  CBC  WBC 4.0 - 10.5 K/uL 20.6  19.8    Hemoglobin 13.0 - 17.0 g/dL 7.3  8.0   8.0   Hematocrit 39.0 - 52.0 % 24.1  26.6  25.7   Platelets 150 - 400 K/uL 437  422        Latest Ref Rng & Units 10/08/2022    5:54 AM 10/07/2022    5:10 AM 10/06/2022    4:57 AM  CMP  Glucose 70 - 99 mg/dL 142  191  71   BUN 8 - 23 mg/dL 29  34  35   Creatinine 0.61 - 1.24 mg/dL 1.20  1.43  1.52   Sodium 135 - 145 mmol/L 142  141  139   Potassium 3.5 - 5.1 mmol/L 4.0  4.7  4.8   Chloride 98 - 111 mmol/L 110  109  107   CO2 22 - 32 mmol/L '27  28  26   '$ Calcium 8.9 - 10.3 mg/dL 8.5  8.8  8.7     Imaging studies: No new pertinent imaging studies   Assessment/Plan:  66 y.o. male 10 Days Post-Op s/p exploratory laparotomy, abdominal washout, and diverting loop ileostomy for intra-abdominal purulent fluid after initial open right colectomy with open cholecystectomy for right colon cancer and cholelithiasis on 10/19    - We can trial FLD + nutritional supplementation   - Continue enteric feeding for now; monitor tolerance. Continue to reassess ability to wean as diet advances  - Monitor leukocytosis; stable             - Continue IV Abx (Meropenem); now also on Micafungin; ID following  - Follow up 10/30 Cx; no growth to date             -  Monitor abdominal examination; on-going ileostomy function             - Midline wound care: Pack daily with saline moistened gauze, cover, secure             - Continue surgical drain; monitor and record output             - Pain control prn; antiemetics prn             - Appreciate cardiology assistance/recommendations   - Therapies on board; recommending inpatient rehab  - Appreciate medicine assistance as well   All of the above findings and recommendations were discussed with the patient and the medical team. All questions and concerns addressed and answered.   -- Edison Simon, PA-C [facility] 10/08/2022, 8:22 AM M-F: 7am - 4pm

## 2022-10-08 NOTE — NC FL2 (Signed)
[place] LEVEL OF CARE SCREENING TOOL     IDENTIFICATION  Patient Name: Nicolas Zuniga Birthdate: 15-Jun-1956 Sex: male Admission Date (Current Location): 09/24/2022  [facility] and [place] Number:  Engineering geologist and Address:  [facility], [address], [place], [place] [address]      Provider Number: [id number]  Attending Physician Name and Address:  Jules Husbands, MD  Relative Name and Phone Number:       Current Level of Care: Hospital Recommended Level of Care: [place] Prior Approval Number:    Date Approved/Denied:   PASRR Number: Manual review  Discharge Plan: SNF    Current Diagnoses: Patient Active Problem List   Diagnosis Date Noted   Intra-abdominal abscess [facility])    Acute hypoxic respiratory failure ([place]) 10/05/2022   Weakness 10/05/2022   Microcytic anemia 10/05/2022   Generalized anxiety disorder 10/04/2022   Diabetic ketoacidosis without coma associated with type 2 diabetes mellitus ([place])    Postoperative intra-abdominal abscess 09/28/2022   Adenocarcinoma of colon ([place])    Persistent atrial fibrillation ([place])    Septic shock ([place])    Hyponatremia 09/25/2022   AKI (acute kidney injury) ([place]) 09/25/2022   S/P right colectomy 09/24/2022   S/P partial resection of colon 09/24/2022   Change in bowel habits    Colonic mass    Polyp of transverse colon    Diabetes mellitus type 2, uncomplicated ([place]) 48/54/6270   Kidney stones 04/30/2020   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   Bradycardia 12/30/2018   Paroxysmal A-fib ([place]) 12/14/2018   Non compliance with medical treatment 04/13/2018   History of atrial fibrillation 07/04/2012   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 Currently on full liquids. Advancing as tolerated. Has NG currently but will discontinue once able.)  AMBULATORY STATUS COMMUNICATION OF NEEDS Skin   Extensive Assist Verbally Skin abrasions, Bruising, Other (Comment), Surgical wounds (Deep tissue injury on left nare: No dressing. Incision on abdomen: Gauze prn.)                       Personal Care Assistance Level of Assistance  Bathing, Feeding, Dressing Bathing Assistance: Maximum assistance Feeding assistance: Limited assistance Dressing Assistance: Maximum assistance     Functional Limitations Info  Sight, Hearing, Speech Sight Info: Adequate Hearing Info: Adequate Speech Info: Adequate    SPECIAL CARE FACTORS FREQUENCY  PT (By licensed PT), OT (By licensed OT)     PT Frequency: 5 x week OT Frequency: 5 x week            Contractures Contractures Info: Not present    Additional Factors Info  Code Status, Allergies, Psychotropic Code Status Info: Full code Allergies Info: NKDA Psychotropic Info: Anxiety         Current Medications (10/08/2022):  This is the current hospital active medication list Current Facility-Administered Medications  Medication Dose Route Frequency Provider Last Rate Last Admin   0.9 %  sodium chloride infusion  250 mL Intravenous Continuous Bradly Bienenstock, NP   Stopped at 10/06/22 1416   acetaminophen (TYLENOL) tablet 1,000 mg  1,000 mg Oral Q6H Pabon, Diego F, MD   1,000 mg at 10/08/22 1211   amiodarone (PACERONE) tablet 200 mg  200 mg Per Tube Daily Rust-Chester, Huel Cote, NP   200 mg at 10/08/22 7353   Chlorhexidine Gluconate Cloth 2 % PADS 6 each  6 each Topical Daily Jules Husbands, MD   6 each at 10/08/22 0903   dextrose 50 % solution 0-50 mL  0-50 mL Intravenous PRN Teressa Lower, NP       feeding supplement (OSMOLITE 1.5 CAL) liquid 1,000 mL  1,000 mL Per Tube Continuous Flora Lipps, MD 70 mL/hr at 10/08/22 0424 1,000 mL at 10/08/22 0424   feeding supplement (PROSource TF20) liquid 60 mL  60 mL Per Tube Daily Flora Lipps, MD   60 mL at 10/08/22 0904   free water 30 mL  30 mL Per Tube Q4H Flora Lipps, MD   30 mL at 10/08/22 0904   heparin ADULT infusion 100 units/mL (25000 units/272m)  1,800 Units/hr Intravenous Continuous CBenita Gutter RPH 18 mL/hr at 10/08/22 1225 1,800 Units/hr at 10/08/22 1225   insulin aspart (novoLOG) injection 0-20 Units  0-20 Units Subcutaneous Q4H GDallie Piles RPH   7 Units at 10/08/22 1212   insulin aspart (novoLOG) injection 8 Units  8 Units Subcutaneous Q4H PFritzi Mandes MD   8 Units at 10/08/22 1212   insulin detemir (LEVEMIR) injection 18 Units  18 Units Subcutaneous BID PFritzi Mandes MD   18 Units at 10/08/22 0905   loperamide (IMODIUM) capsule 4 mg  4 mg Oral PRN STylene Fantasia PA-C       meropenem ([facility] 1 g in sodium chloride 0.9 % 100 mL IVPB  1 g Intravenous Q8H KVal Riles MD 200 mL/hr at 10/08/22 0610 1 g at 10/08/22 0610   metoprolol tartrate (LOPRESSOR) tablet 50 mg  50 mg Oral BID PFritzi Mandes MD   50 mg at 10/08/22 0903   micafungin (MYCAMINE) 100 mg in sodium chloride 0.9 % 100 mL IVPB  100 mg Intravenous Q24H RTsosie Billing MD 105 mL/hr at 10/07/22 2325 100 mg at 10/07/22 2325   ondansetron (ZOFRAN-ODT) disintegrating tablet 4 mg  4 mg Oral Q6H PRN Pabon, DMarjory Lies MD       Or   ondansetron (ZOFRAN) injection 4 mg  4 mg Intravenous Q6H PRN Pabon, Diego F, MD       Oral care mouth rinse  15 mL Mouth Rinse PRN Pabon, Diego F, MD       oxyCODONE (Oxy IR/ROXICODONE) immediate release tablet 5-10 mg  5-10 mg Oral Q4H PRN PFritzi Mandes MD       pantoprazole (PROTONIX) injection 40 mg  40 mg  Intravenous Q12H PFritzi Mandes MD       prochlorperazine (COMPAZINE) tablet 10 mg  10 mg Oral Q6H PRN Pabon, Diego F, MD       Or   prochlorperazine (COMPAZINE) injection 5-10 mg  5-10 mg Intravenous Q6H PRN Pabon, Diego F, MD       sodium chloride flush (NS) 0.9 % injection 10-40 mL  10-40 mL Intracatheter Q12H Pabon, Diego F, MD   10 mL at 10/08/22 0905   sodium chloride flush (NS) 0.9 % injection 10-40 mL  10-40 mL Intracatheter PRN Pabon, Diego F, MD       sodium chloride flush (  NS) 0.9 % injection 5 mL  5 mL Intracatheter Q8H Aletta Edouard, MD   5 mL at 10/08/22 0543     Discharge Medications: Please see discharge summary for a list of discharge medications.  Relevant Imaging Results:  Relevant Lab Results:   Additional Information SS#: [id number]  Candie Chroman, LCSW

## 2022-10-08 NOTE — TOC Progression Note (Addendum)
Transition of Care [facility]) - Progression Note    Patient Details  Name: Nicolas Zuniga MRN: [id number] Date of Birth: 1956-05-12  Transition of Care [facility]) CM/SW [place], LCSW Phone Number: 10/08/2022, 10:14 AM  Clinical Narrative: Faxed referral to [facility].    12:56 pm: Patient now without TPN. Started SNF search as backup to inpatient rehab. Left message for [facility] admissions coordinator to review.  3:44 pm: [facility] bed. Uploaded clinicals into [place] [facility] for PASARR review.  4:00 pm: PASARR obtained: [id number]. Expires 12/2.  Expected Discharge Plan: [place] Barriers to Discharge: Continued Medical Work up  Expected Discharge Plan and Services Expected Discharge Plan: [place]   Discharge Planning Services: CM Consult Post Acute Care Choice: [place] Living arrangements for the past 2 months: Single Family Home                                       Social Determinants of Health (SDOH) Interventions    Readmission Risk Interventions    09/30/2022    5:08 PM  Readmission Risk Prevention Plan  Transportation Screening Complete  PCP or Specialist Appt within 3-5 Days Complete  HRI or [place] Complete  Social Work Consult for [place] Planning/Counseling Complete  Palliative Care Screening Not Applicable  Medication Review Press photographer) Complete

## 2022-10-09 ENCOUNTER — Inpatient Hospital Stay: Payer: Medicare Other

## 2022-10-09 DIAGNOSIS — J9601 Acute respiratory failure with hypoxia: Secondary | ICD-10-CM | POA: Diagnosis not present

## 2022-10-09 DIAGNOSIS — K651 Peritoneal abscess: Secondary | ICD-10-CM | POA: Diagnosis not present

## 2022-10-09 DIAGNOSIS — C189 Malignant neoplasm of colon, unspecified: Secondary | ICD-10-CM | POA: Diagnosis not present

## 2022-10-09 DIAGNOSIS — N179 Acute kidney failure, unspecified: Secondary | ICD-10-CM | POA: Diagnosis not present

## 2022-10-09 DIAGNOSIS — I480 Paroxysmal atrial fibrillation: Secondary | ICD-10-CM | POA: Diagnosis not present

## 2022-10-09 LAB — CBC WITH DIFFERENTIAL/PLATELET
Abs Immature Granulocytes: 0.24 10*3/uL — ABNORMAL HIGH (ref 0.00–0.07)
Basophils Absolute: 0.1 10*3/uL (ref 0.0–0.1)
Basophils Relative: 0 %
Eosinophils Absolute: 0.1 10*3/uL (ref 0.0–0.5)
Eosinophils Relative: 1 %
HCT: 19.1 % — ABNORMAL LOW (ref 39.0–52.0)
Hemoglobin: 6 g/dL — ABNORMAL LOW (ref 13.0–17.0)
Immature Granulocytes: 1 %
Lymphocytes Relative: 10 %
Lymphs Abs: 1.7 10*3/uL (ref 0.7–4.0)
MCH: 24.6 pg — ABNORMAL LOW (ref 26.0–34.0)
MCHC: 31.4 g/dL (ref 30.0–36.0)
MCV: 78.3 fL — ABNORMAL LOW (ref 80.0–100.0)
Monocytes Absolute: 1.7 10*3/uL — ABNORMAL HIGH (ref 0.1–1.0)
Monocytes Relative: 10 %
Neutro Abs: 13.8 10*3/uL — ABNORMAL HIGH (ref 1.7–7.7)
Neutrophils Relative %: 78 %
Platelets: 369 10*3/uL (ref 150–400)
RBC: 2.44 MIL/uL — ABNORMAL LOW (ref 4.22–5.81)
RDW: 17.4 % — ABNORMAL HIGH (ref 11.5–15.5)
WBC: 17.6 10*3/uL — ABNORMAL HIGH (ref 4.0–10.5)
nRBC: 0 % (ref 0.0–0.2)

## 2022-10-09 LAB — BASIC METABOLIC PANEL
Anion gap: 5 (ref 5–15)
BUN: 22 mg/dL (ref 8–23)
CO2: 23 mmol/L (ref 22–32)
Calcium: 6.8 mg/dL — ABNORMAL LOW (ref 8.9–10.3)
Chloride: 116 mmol/L — ABNORMAL HIGH (ref 98–111)
Creatinine, Ser: 0.86 mg/dL (ref 0.61–1.24)
GFR, Estimated: >60 mL/min (ref >60)
Glucose, Bld: 96 mg/dL (ref 70–99)
Potassium: 3.1 mmol/L — ABNORMAL LOW (ref 3.5–5.1)
Sodium: 144 mmol/L (ref 135–145)

## 2022-10-09 LAB — GLUCOSE, CAPILLARY
Glucose-Capillary: 123 mg/dL — ABNORMAL HIGH (ref 70–99)
Glucose-Capillary: 127 mg/dL — ABNORMAL HIGH (ref 70–99)
Glucose-Capillary: 133 mg/dL — ABNORMAL HIGH (ref 70–99)
Glucose-Capillary: 169 mg/dL — ABNORMAL HIGH (ref 70–99)
Glucose-Capillary: 195 mg/dL — ABNORMAL HIGH (ref 70–99)
Glucose-Capillary: 228 mg/dL — ABNORMAL HIGH (ref 70–99)
Glucose-Capillary: 236 mg/dL — ABNORMAL HIGH (ref 70–99)
Glucose-Capillary: 289 mg/dL — ABNORMAL HIGH (ref 70–99)

## 2022-10-09 LAB — HEMOGLOBIN AND HEMATOCRIT, BLOOD
HCT: 28.7 % — ABNORMAL LOW (ref 39.0–52.0)
Hemoglobin: 9.1 g/dL — ABNORMAL LOW (ref 13.0–17.0)

## 2022-10-09 LAB — PROTIME-INR
INR: 1.5 — ABNORMAL HIGH (ref 0.8–1.2)
Prothrombin Time: 18.2 seconds — ABNORMAL HIGH (ref 11.4–15.2)

## 2022-10-09 LAB — PREPARE RBC (CROSSMATCH)

## 2022-10-09 LAB — TROPONIN I (HIGH SENSITIVITY)
Troponin I (High Sensitivity): 24 ng/L — ABNORMAL HIGH (ref <18)
Troponin I (High Sensitivity): 27 ng/L — ABNORMAL HIGH (ref <18)

## 2022-10-09 LAB — HEPARIN LEVEL (UNFRACTIONATED)
Heparin Unfractionated: <0.1 IU/mL — ABNORMAL LOW (ref 0.30–0.70)
Heparin Unfractionated: <0.1 IU/mL — ABNORMAL LOW (ref 0.30–0.70)

## 2022-10-09 LAB — BODY FLUID CULTURE W GRAM STAIN

## 2022-10-09 LAB — APTT: aPTT: 51 seconds — ABNORMAL HIGH (ref 24–36)

## 2022-10-09 LAB — PHOSPHORUS: Phosphorus: 1.8 mg/dL — ABNORMAL LOW (ref 2.5–4.6)

## 2022-10-09 LAB — CHROMOGRANIN A: Chromogranin A (ng/mL): 173.9 ng/mL — ABNORMAL HIGH (ref 0.0–101.8)

## 2022-10-09 MED ORDER — POTASSIUM CHLORIDE 20 MEQ PO PACK
40.0000 meq | PACK | ORAL | Status: AC
  Administered 2022-10-09 (×2): 40 meq
  Filled 2022-10-09 (×2): qty 2

## 2022-10-09 MED ORDER — POTASSIUM & SODIUM PHOSPHATES 280-160-250 MG PO PACK
2.0000 | PACK | Freq: Three times a day (TID) | ORAL | Status: AC
  Administered 2022-10-09 (×3): 2
  Filled 2022-10-09 (×3): qty 2

## 2022-10-09 MED ORDER — METOPROLOL TARTRATE 5 MG/5ML IV SOLN: 2.5000 mg | INTRAVENOUS | Status: DC | PRN

## 2022-10-09 MED ORDER — METOPROLOL TARTRATE 5 MG/5ML IV SOLN
5.0000 mg | Freq: Four times a day (QID) | INTRAVENOUS | Status: DC | PRN
  Administered 2022-10-09: 5 mg via INTRAVENOUS
  Filled 2022-10-09: qty 5

## 2022-10-09 MED ORDER — POTASSIUM CHLORIDE CRYS ER 20 MEQ PO TBCR: 40.0000 meq | EXTENDED_RELEASE_TABLET | ORAL | Status: DC

## 2022-10-09 MED ORDER — LOPERAMIDE HCL 2 MG PO CAPS
4.0000 mg | ORAL_CAPSULE | Freq: Two times a day (BID) | ORAL | Status: DC
  Administered 2022-10-09: 4 mg via ORAL
  Filled 2022-10-09: qty 2

## 2022-10-09 MED ORDER — SODIUM CHLORIDE 0.9% IV SOLUTION: Freq: Once | INTRAVENOUS | Status: DC

## 2022-10-09 MED ORDER — DIPHENHYDRAMINE HCL 50 MG/ML IJ SOLN: 25.0000 mg | Freq: Every evening | INTRAMUSCULAR | Status: DC | PRN

## 2022-10-09 MED ORDER — LOPERAMIDE HCL 2 MG PO CAPS
2.0000 mg | ORAL_CAPSULE | Freq: Two times a day (BID) | ORAL | Status: DC
  Administered 2022-10-09: 2 mg via ORAL
  Filled 2022-10-09: qty 1

## 2022-10-09 MED ORDER — TRAZODONE HCL 50 MG PO TABS: 25.0000 mg | ORAL_TABLET | Freq: Every day | ORAL | Status: DC

## 2022-10-09 MED ORDER — METOPROLOL TARTRATE 5 MG/5ML IV SOLN
5.0000 mg | Freq: Once | INTRAVENOUS | Status: AC
  Administered 2022-10-09: 2.5 mg via INTRAVENOUS
  Filled 2022-10-09: qty 5

## 2022-10-09 MED ORDER — FUROSEMIDE 10 MG/ML IJ SOLN
20.0000 mg | Freq: Once | INTRAMUSCULAR | Status: AC
  Administered 2022-10-09: 20 mg via INTRAVENOUS
  Filled 2022-10-09 (×2): qty 4

## 2022-10-09 MED ORDER — MIDODRINE HCL 5 MG PO TABS
2.5000 mg | ORAL_TABLET | Freq: Three times a day (TID) | ORAL | Status: DC
  Administered 2022-10-09 – 2022-10-10 (×3): 2.5 mg
  Filled 2022-10-09 (×3): qty 1

## 2022-10-09 MED ORDER — POTASSIUM & SODIUM PHOSPHATES 280-160-250 MG PO PACK: 2.0000 | PACK | Freq: Three times a day (TID) | ORAL | Status: DC

## 2022-10-09 MED ORDER — TRAMADOL HCL 50 MG PO TABS: 50.0000 mg | ORAL_TABLET | Freq: Four times a day (QID) | ORAL | Status: DC | PRN

## 2022-10-09 MED ORDER — K PHOS MONO-SOD PHOS DI & MONO 155-852-130 MG PO TABS
500.0000 mg | ORAL_TABLET | Freq: Three times a day (TID) | ORAL | Status: DC
  Filled 2022-10-09: qty 2

## 2022-10-09 NOTE — Progress Notes (Signed)
CT reviewed and d/w radiologist. NO Bowel injuries, leaks or definitive abscess. NO retroperitoneal bleeds or hematomas. NO Need for re operations at this time. I really can not explain why he deteriorates in this way in such a rapid pace. HE does Neuroendocrine tumor w an advance stage and overall poor prognosis.  Wife updated

## 2022-10-09 NOTE — Progress Notes (Signed)
[facility] IP rehab admissions - patient still has limited tolerance with therapies.  I will have my partner follow up on Monday for progress.  Call for questions.  [phone]

## 2022-10-09 NOTE — Progress Notes (Addendum)
[place] Hospital Day(s): 15.   Post op day(s): 11 Days Post-Op.   Interval History:  Patient seen and examined Patient reports he is making slow progress No abdominal pain No fever, chills, nausea, emesis  His leukocytosis continues to improve; 17.6K Hgb to 6.0; no evidence of bleeding  PLT normal at 369K Renal function normalized; sCr - 0.86; UO - 4570 ccs Hypokalemia to 3.1 INR 1.8 Surgical drain with minimal output; serous Percutaneous drain (RUQ);100 ccs; serous Cx x3 from (10/30) are without growth He continues on Meropenem and micafungin; ID on-board He continues to have ostomy function; 1225 ccs recorded  On enteric feedings On FLD  Vital signs in last 24 hours: [min-max] current  Temp:  [97.4 F (36.3 C)-98.3 F (36.8 C)] 98.3 F (36.8 C) (11/03 0731) Pulse Rate:  [79-86] 80 (11/03 0731) Resp:  [16-20] 16 (11/03 0731) BP: (128-145)/(59-63) 139/63 (11/03 0731) SpO2:  [94 %-96 %] 96 % (11/03 0731) Weight:  [126.4 kg] 126.4 kg (11/03 0500)     Height: '6\' 2"'$  (188 cm) Weight: 126.4 kg BMI (Calculated): 35.76   Intake/Output last 2 shifts:  11/02 0701 - 11/03 0700 In: 2416.3 [P.O.:240; WU/JW:1191.4; IV Piggyback:205] Out: 7829 [Urine:4570; Drains:110; Stool:1225]   Physical Exam:  Constitutional: Alert, awake, NAD Respiratory: Breathing non-labor, no respiratory distress Cardiac: regular rate, regular rhythm  Gastrointestinal: Soft, abdominal soreness, non-distended, no rebound/guarding. Surgical drain with serosanguinous output. 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 Newly placed drain in RUQ; serous fluid Integumentary: Midline laparotomy healing via secondary intention, no erythema, no drainage, no evidence of fistula  Labs:     Latest Ref Rng & Units 10/09/2022    6:13 AM 10/08/2022    5:54 AM 10/07/2022    5:10 AM  CBC  WBC 4.0 - 10.5 K/uL 17.6  20.6  19.8    Hemoglobin 13.0 - 17.0 g/dL 6.0  7.3  8.0   Hematocrit 39.0 - 52.0 % 19.1  24.1  26.6   Platelets 150 - 400 K/uL 369  437  422       Latest Ref Rng & Units 10/09/2022    6:13 AM 10/08/2022    5:54 AM 10/07/2022    5:10 AM  CMP  Glucose 70 - 99 mg/dL 96  142  191   BUN 8 - 23 mg/dL 22  29  34   Creatinine 0.61 - 1.24 mg/dL 0.86  1.20  1.43   Sodium 135 - 145 mmol/L 144  142  141   Potassium 3.5 - 5.1 mmol/L 3.1  4.0  4.7   Chloride 98 - 111 mmol/L 116  110  109   CO2 22 - 32 mmol/L '23  27  28   '$ Calcium 8.9 - 10.3 mg/dL 6.8  8.5  8.8     Imaging studies: No new pertinent imaging studies   Assessment/Plan:  66 y.o. male 65 Days Post-Op s/p exploratory laparotomy, abdominal washout, and diverting loop ileostomy for intra-abdominal purulent fluid after initial open right colectomy with open cholecystectomy for right colon cancer and cholelithiasis on 10/19    - Continue FLD + nutritional supplementation   - Continue enteric feeding for now; monitor tolerance. Continue to reassess ability to wean as diet advances. Still with poor PO intake  - Monitor leukocytosis; improving  - Monitor H&H; no evidence of bleeding. Anemia is likely multifactorial. Will plan to transfuse 1 unit pRBCS today             -  Continue IV Abx (Meropenem); now also on Micafungin; ID following  - Follow up 10/30 Cx; no growth to date             - Monitor abdominal examination - Monitor on-going ileostomy function; imodium started 11/02             - Midline wound care: Pack daily with saline moistened gauze, cover, secure             - Continue surgical drain; monitor and record output             - Pain control prn; antiemetics prn             - Appreciate cardiology assistance/recommendations   - Therapies on board; recommending inpatient rehab  - Appreciate medicine assistance as well    - Discharge Planning; Making slow progress, biggest barrier seems to remain nutrition and functional status  All of  the above findings and recommendations were discussed with the patient and the medical team. All questions and concerns addressed and answered.   -- Edison Simon, PA-C [facility] 10/09/2022, 7:53 AM M-F: 7am - 4pm

## 2022-10-09 NOTE — Progress Notes (Signed)
[facility] Cardiology    SUBJECTIVE: Resting comfortably in bed lethargic but arousable.  Denies any fever chills or sweats wife at the bedside   Vitals:   10/09/22 1400 10/09/22 1415 10/09/22 1425 10/09/22 1430  BP: (!) 114/58  107/66 105/61  Pulse: 96 (!) 109 (!) 102 (!) 111  Resp: (!) 40 (!) 33 (!) 36 (!) 41  Temp:      TempSrc:      SpO2: 92% 95% 94% 94%  Weight:      Height:         Intake/Output Summary (Last 24 hours) at 10/09/2022 1440 Last data filed at 10/09/2022 1357 Gross per 24 hour  Intake 3053.27 ml  Output 4290 ml  Net -1236.73 ml      PHYSICAL EXAM  General: Well developed, well nourished, in no acute distress HEENT:  Normocephalic and atramatic Neck:  No JVD.  Lungs: Clear bilaterally to auscultation and percussion. Heart: HRRR . Normal S1 and S2 without gallops or murmurs.  Abdomen: Bowel sounds are positive, abdomen soft and non-tender  Msk:  Back normal, normal gait. Normal strength and tone for age. Extremities: No clubbing, cyanosis or edema.   Neuro: Alert and oriented X 3. Psych:  Good affect, responds appropriately   LABS: Basic Metabolic Panel: Recent Labs    10/07/22 0510 10/08/22 0554 10/09/22 0613  NA 141 142 144  K 4.7 4.0 3.1*  CL 109 110 116*  CO2 '28 27 23  '$ GLUCOSE 191* 142* 96  BUN 34* 29* 22  CREATININE 1.43* 1.20 0.86  CALCIUM 8.8* 8.5* 6.8*  MG 2.0  --   --   PHOS 3.2 2.3* 1.8*   Liver Function Tests: No results for input(s): "AST", "ALT", "ALKPHOS", "BILITOT", "PROT", "ALBUMIN" in the last 72 hours. No results for input(s): "LIPASE", "AMYLASE" in the last 72 hours. CBC: Recent Labs    10/08/22 0554 10/09/22 0613  WBC 20.6* 17.6*  NEUTROABS 16.7* 13.8*  HGB 7.3* 6.0*  HCT 24.1* 19.1*  MCV 80.3 78.3*  PLT 437* 369   Cardiac Enzymes: No results for input(s): "CKTOTAL", "CKMB", "CKMBINDEX", "TROPONINI" in the last 72 hours. BNP: Invalid input(s): "POCBNP" D-Dimer: No results for input(s): "DDIMER" in the last 72  hours. Hemoglobin A1C: No results for input(s): "HGBA1C" in the last 72 hours. Fasting Lipid Panel: No results for input(s): "CHOL", "HDL", "LDLCALC", "TRIG", "CHOLHDL", "LDLDIRECT" in the last 72 hours. Thyroid Function Tests: No results for input(s): "TSH", "T4TOTAL", "T3FREE", "THYROIDAB" in the last 72 hours.  Invalid input(s): "FREET3" Anemia Panel: No results for input(s): "VITAMINB12", "FOLATE", "FERRITIN", "TIBC", "IRON", "RETICCTPCT" in the last 72 hours.  DG Abd Portable 1V  Result Date: 10/08/2022 CLINICAL DATA:  Placement of feeding tube EXAM: PORTABLE ABDOMEN - 1 VIEW COMPARISON:  Previous studies including the examination done earlier today FINDINGS: Tip of feeding tube is seen in the region of distal antrum of the stomach. Bowel gas pattern is nonspecific. Surgical drain is seen in right mid abdomen. IMPRESSION: Tip of feeding tube is seen in the distal antrum of the stomach. Electronically Signed   By: Elmer Picker M.D.   On: 10/08/2022 17:29   DG Abd Portable 1V  Result Date: 10/08/2022 CLINICAL DATA:  [id number] Encounter for feeding tube placement [id number] EXAM: PORTABLE ABDOMEN - 1 VIEW COMPARISON:  Radiograph 11/31/2023 FINDINGS: Feeding tube has been slightly retracted, tip overlying the proximal stomach, just beyond the GE junction. Nonobstructive bowel gas pattern. Field of view includes the left upper abdomen. IMPRESSION:  Feeding tube is been slightly retracted, tip overlying the proximal stomach just beyond the GE junction, would consider advancement. Electronically Signed   By: Maurine Simmering M.D.   On: 10/08/2022 16:06     Echo   TELEMETRY: Atrial fibrillation rate at around 149 down to 90:  ASSESSMENT AND PLAN:  Principal Problem:   Adenocarcinoma of colon ([place]) Active Problems:   Diabetes ([place])   Essential hypertension   Paroxysmal A-fib ([facility])   S/P right colectomy   S/P partial resection of colon   Hyponatremia   AKI (acute kidney injury) ([place])    Postoperative intra-abdominal abscess   Persistent atrial fibrillation ([facility])   Septic shock ([facility])   Diabetic ketoacidosis without coma associated with type 2 diabetes mellitus ([facility])   Generalized anxiety disorder   Acute hypoxic respiratory failure ([facility])   Weakness   Microcytic anemia   Intra-abdominal abscess ([facility])     Plan Rapid atrial fibrillation recommend metoprolol as needed for rate control Defer amiodarone for now Okay to use anticoagulation with heparin as necessary Continue diabetes management and control Supplemental oxygen as necessary for hypoxemia Mild hypotension consider adding midodrine blood as well as fluids Recommend conservative cardiac input at this stage   Yolonda Kida, MD, 10/09/2022 2:40 PM

## 2022-10-09 NOTE — Consult Note (Signed)
NAME:  BINGHAM MILLETTE, MRN:  [id number], DOB:  1956/07/23, LOS: 28 ADMISSION DATE:  09/24/2022, CONSULTATION DATE: 09/28/22 REFERRING MD: Dr. Dahlia Byes   History of Present Illness:  66 yo male with right colon cancer and cholelithiasis who presented to [facility] on 10/19 for laparoscopic right colectomy and cholecystectomy.  Surgical findings were right colon mass with evidence of bulky mesenteric nodal involvement; large right colon mass adhered to the retroperitoneum; mesenteric nodes within terminal ileum; tension free anastomosis, no evidence of intraop leak and good perfusion; and chronic calculus cholecystitis.  Postop pt admitted to the medsurg unit per general surgery for additional workup and treatment.  Prolonged hospital course, please see detailed hospital course under significant events. Cardiology, PCCM, General Surgery, TRH, Infectious Disease, Psychiatry & Nephrology all involved in care. 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  Hospitalist team consulted to assist with management all beta blockers and flecainide discontinued  10/22: Pt developed atrial fibrillation with rvr, acute respiratory failure, and hypotension with questionable episode of Vtach, rapid response initiated.  Pt required amiodarone bolus followed by drip and transfer to the stepdown unit.  Rate remained  uncontrolled and cardizem gtt added _0  mg/hr in addition to amiodarone gtt.  Cardiology consulted   10/22: CT Abd Pelvis concerning for possible small leak but no contrast extravasation.  General Surgery recommended- antibiotic therapy; repeat scan in 24-48hrs; and keep NPO except meds  10/23: revealed no evidence of gross enteric contrast extravasation but increased fluid collection adjacent to the anastomosis and slight increase in fluid volume in the right paracolic gutter.  Pt taken back to the OR for exploratory laparotomy with findings of no obvious anastomotic leak or evidence of large or small bowel injuries but intra-abdominal purulent fluid with significant inflammatory response present. Abdominal washout with placement of JP drain and diverting loop colostomy. Pt remained mechanically intubated.  PCCM        team consulted plans for SBT and possible extubation  10/24: Pt with worsening acute renal failure Nephrology consulted.  Pt with bradycardia overnight cardizem and precedex gtts discontinued.  Pt remains on amiodarone gtt at 30 mg/hr  Pt alert and following commands plans for possible SBT today ~ EXTUBATED 10/25: Tolerating extubation from respiratory standpoint. Off Vasopressors. Slight worsening of AKI, starting to make urine.  DKA resolved, weaning off insulin gtt to SQ insulin.  Atrial fibrillation rate controlled on Amiodarone.  10/26: transfer to [facility] service, NG tube was clamped and output is very minimal, general surgery recommended to start clear liquid diet, NGT removed. No significant overnight events  10/27: Clinically continues to improve, creat trending down, better HD. WBC up bu no fevers and drain is serous and midline wound is healing well. Probably time for diuresis. If wbc continue to be up or if he spikes a fever or has any set back will need repeat CT A/P w po contrast only. 10/28:  No significant events overnight.  In the morning time patient was complaining of  some  abdominal discomfort, feeling better now.  WBC count 35.5 significant elevated, discussed with general surgery, CT a/p shows free gas, multiple fluid collections without definitive abscess, ESR 89, Pro-Cal 0.88, CRP 23.3. Tolerating diet well for now.  Denied any chest pain or palpitations.  10/29: Psych consulted for anxiety. Fluid culture from JP drain and meropenem started due to persistent leukocytosis. Sputum culture- serratia marcescens- resistant to cefazolin only s/p zosyn, on meropenem. 10/30: Overnight bright red blood reported per ileostomy, heparin held. Hgb stable. IR consulted for persistent leukocytosis and management of intra-abdominal fluid collections with plan for aspiration and possible drain placement. 2 fluid collections aspirated and 3rd fluid collection underwent drain placement. Post procedure patient transferred to ICU urgently due to worsening mental status, hypercapnia and increased WOB. Patient required emergent intubation and mechanical ventilatory support upon arrival. 10/31 successfully extubated 11/1 transferred to [facility] 11/2 fungal infection of the ABD 11/3 transferred back to ICU for low BP, afib with RVR, low  HGB  Significant Test:  10/22: CT Abd Pelvis Ill-defined slightly high density fluid along the ileocolic suture line, which may represent a small amount of extravasated contrast/leak. Small to moderate amount of pneumoperitoneum and small amount of ascites within the abdomen and pelvis, not unexpected given surgery but  nonspecific. No evidence of focal collection/defined abscess. Trace bilateral pleural effusions and bibasilar atelectasis. 2 low-density areas within the prostate gland, nonspecific but correlate with PSA. Aortic Atherosclerosis (ICD10-I70.0). 10/22: CTA Chest no definite evidence of pulmonary embolus. Minimal bilateral pleural effusions are noted with adjacent subsegmental atelectasis. Pneumoperitoneum is noted in visualized portion of upper  abdomen consistent with reported history of recent laparoscopic right colectomy and cholecystectomy. 10/23: CT Abd Pelvis while there is no evidence for gross enteric contrast extravasation at the level of the ileocolic anastomosis, the small fluid collection adjacent to the anastomosis identified on yesterday's study has increased in attenuation in the interval. This increase in attenuation is concerning for some trace spill of contrast from the anastomosis into the collection. Slight increase in fluid volume in the right paracolic gutter with similar fluid volume around the liver and in the left paracolic gutter. Collections of interloop mesenteric fluid are not substantially changed. Free gas in the peritoneal cavity is not unexpected 4 days out from surgery. Gas in the midline subcutaneous tissues deep to the staple line has increased in the interval. Contrast in the kidneys compatible with residual from yesterday's infused scan. Bibasilar atelectasis with tiny bilateral pleural effusions. Aortic Atherosclerosis (ICD10-I70.0).  10/28 CT A/P: Decreased free gas, multiple fluid collections no definitive abscess due to lack of IV contrast.  Small bilateral pleural effusions.  10/30 IR CT aspiration of left anterior abdominal fluid collection, aspiration of left posterolateral abdominal fluid collection and catheter drainage of perihepatic fluid collection: Left anterior fluid collection: clear, yellow fluid. Sample sent for culture. 90 mL aspirated via 18 G trocar needle resulting in complete decompression. Left posterolateral fluid collection: slightly turbid, darker yellow fluid. Sample sent for culture. 50 mL aspirated resulting in near-complete decompression. Perihepatic fluid collection: clear, yellow fluid. 10 Fr drain placed in perihepatic fluid collection given its larger size. Drain attached to suction bulb drainage.   Interim History / Subjective:  Transferred back to ICU WOB, SOB Afib  with RVR 130's BP OK Very weak cough Generalized weakness Dubhoff placed for enteral feeds  Objective   Blood pressure (!) 114/58, pulse 96, temperature 98.2 F (36.8 C), temperature source Oral, resp. rate (!) 40, height 6'  2" (1.88 m), weight 126.4 kg, SpO2 92 %.        Intake/Output Summary (Last 24 hours) at 10/09/2022 1427 Last data filed at 10/09/2022 1357 Gross per 24 hour  Intake 3053.27 ml  Output 4290 ml  Net -1236.73 ml    Filed Weights   10/07/22 0433 10/08/22 0453 10/09/22 0500  Weight: 126.5 kg 126 kg 126.4 kg     REVIEW OF SYSTEMS +SOB +PAIN  PHYSICAL EXAMINATION:  GENERAL:critically ill appearing, +resp distress EYES: Pupils equal, round, reactive to light.  No scleral icterus.  MOUTH: Moist mucosal membrane. PULMONARY: Lungs clear to auscultation, +rhonchi, +wheezing CARDIOVASCULAR: S1 and S2.  Regular rate and rhythm GASTROINTESTINAL: +tender, -distended. Positive bowel sounds.  MUSCULOSKELETAL:edema.  NEUROLOGIC: lethargic SKIN:normal, warm to touch, Capillary refill delayed  Pulses present bilaterally  Pressure Injury 09/30/22 Nare Left Deep Tissue Pressure Injury - Purple or maroon localized area of discolored intact skin or blood-filled blister due to damage of underlying soft tissue from pressure and/or shear. nonblanchable, red/purple bruise at lef (Active)  09/30/22 1100  Location: Nare  Location Orientation: Left  Staging: Deep Tissue Pressure Injury - Purple or maroon localized area of discolored intact skin or blood-filled blister due to damage of underlying soft tissue from pressure and/or shear.  Wound Description (Comments): nonblanchable, red/purple bruise at left nare  Present on Admission:   Extremities: warm/dry, pulses + 2 R/P, +2 edema noted BLE   Assessment & Plan:   66 yo male with right colon cancer and cholelithiasis who presented to [facility] on 10/19 for laparoscopic right colectomy and cholecystectomy.  Surgical findings were  right colon mass with evidence of bulky mesenteric nodal involvement; complicated by intra-abdominal infection and afib with RVR and anemia  RESP DISTRESS suspected aspiration, assess for MI Resp failure -high risk for intubation Oxygen as needed Aspiration precautions   Paroxsymal Atrial fibrillation  Hypotension multifocal in the setting of acute blood loss anemia & sedation in the setting of mechanical ventilation HFpEF Hx: HTN and Chronic diastolic dysfunction  ECHOCARDIOGRAM 09/29/22: LVEF 09-47%, normal diastolic function, RV function normal - heparin drip  Beta blocker therapy per cardiology  ACUTE ANEMIA-secondary to Acute Blood Loss TRANSFUSE AS NEEDED CONSIDER TRANSFUSION  IF HGB<7 DVT PRX with TED/SCD's ONLY - Monitor for s/s of bleeding Plan for CT abd to assess for bleed  RENAL  Baseline Cr: 1.06, Cr on admission: 2.2, as high as 4.61 >> now trending down, most recent: 1.32 (10/30) - Strict I/O's: alert provider if UOP < 0.5 mL/kg/hr - Daily BMP, replace electrolytes PRN - Avoid nephrotoxic agents as able, ensure adequate renal perfusion - Re-Consult nephrology if indicated     GI Right colon cancer and cholelithiasis s/p laparoscopic right colectomy and cholecystectomy: 09/24/22 Intra-abdominal seromas s/p exploratory laparotomy with abdominal washout and diverting loop ileostomy 10/23 +YEAST INFECTION S/p CT aspiration of left anterior abdominal fluid collection, aspiration of left posterolateral abdominal fluid collection and catheter drainage of perihepatic fluid collection 10/30 - f/u culture, daily CBC, monitor WBC/fever curve - General Surgery recs    ENDO - ICU hypoglycemic\Hyperglycemia protocol -check FSBS per protocol   ELECTROLYTES -follow labs as needed -replace as needed -pharmacy consultation and following    Best Practice (right click and "Reselect all SmartList Selections" daily)  Diet/type: NPO DVT prophylaxis: SCD GI  prophylaxis: PPI Lines: Right upper extremity PICC and still needed  Foley:  Yes, and it is still needed Code Status:  full code   Labs   CBC:  Recent Labs  Lab 10/05/22 0440 10/05/22 1842 10/06/22 0013 10/06/22 0457 10/06/22 1135 10/07/22 0510 10/08/22 0554 10/09/22 0613  WBC 31.9* 29.4*  --  31.1*  --  19.8* 20.6* 17.6*  NEUTROABS 24.6*  --   --  22.8*  --  16.3* 16.7* 13.8*  HGB 7.2* 7.4*   < > 8.2* 8.0* 8.0* 7.3* 6.0*  HCT 23.3* 23.0*   < > 27.3* 25.7* 26.6* 24.1* 19.1*  MCV 77.9* 79.0*  --  81.0  --  79.4* 80.3 78.3*  PLT 405* 402*  --  499*  --  422* 437* 369   < > = values in this interval not displayed.     Basic Metabolic Panel: Recent Labs  Lab 10/04/22 0538 10/05/22 0440 10/05/22 1842 10/05/22 2045 10/05/22 2207 10/06/22 0110 10/06/22 0457 10/07/22 0510 10/08/22 0554 10/09/22 0613  NA 134* 136   < >  --    < > 138 139 141 142 144  K 4.1 4.4   < >  --    < > 4.9 4.8 4.7 4.0 3.1*  CL 102 106   < >  --    < > 107 107 109 110 116*  CO2 23 25   < >  --    < > _0 GLUCOSE 313* 248*   < >  --    < > 156* 71 191* 142* 96  BUN 33* 31*   < >  --    < > 35* 35* 34* 29* 22  CREATININE 1.67* 1.32*   < >  --    < > 1.45* 1.52* 1.43* 1.20 0.86  CALCIUM 8.4* 8.4*   < >  --    < > 8.7* 8.7* 8.8* 8.5* 6.8*  MG 1.7 1.8  --  1.7  --   --  1.8 2.0  --   --   PHOS 3.1 3.1  --  3.8  --   --  4.0 3.2 2.3* 1.8*   < > = values in this interval not displayed.    GFR: Estimated Creatinine Clearance: 119.4 mL/min (by C-G formula based on SCr of 0.86 mg/dL). Recent Labs  Lab 10/03/22 0422 10/03/22 0426 10/05/22 1842 10/06/22 0022 10/06/22 0110 10/06/22 0457 10/07/22 0510 10/08/22 0554 10/09/22 0613  PROCALCITON 0.88  --   --   --   --   --   --   --   --   WBC  --    < > 29.4*  --   --  31.1* 19.8* 20.6* 17.6*  LATICACIDVEN  --   --  1.2 1.3 2.0* 1.4  --   --   --    < > = values in this interval not displayed.     Liver Function Tests: Recent Labs   Lab 10/05/22 1842 10/05/22 2207 10/06/22 0110  AST _1 ALT _2 ALKPHOS 59 63 64  BILITOT 1.1 0.5 0.5  PROT 5.7* 6.2* 6.7  ALBUMIN 1.6* 1.7* 2.2*     No results for input(s): "LIPASE", "AMYLASE" in the last 168 hours. No results for input(s): "AMMONIA" in the last 168 hours.  ABG    Component Value Date/Time   PHART 7.29 (L) 10/05/2022 1920   PCO2ART 57 (H) 10/05/2022 1920   PO2ART 90 10/05/2022 1920   HCO3 27.4 10/05/2022 1920   ACIDBASEDEF 0.3 10/05/2022 1920   O2SAT 98 10/05/2022 1920     Coagulation  Profile: Recent Labs  Lab 10/05/22 0440 10/09/22 0613  INR 1.3* 1.5*     Cardiac Enzymes: No results for input(s): "CKTOTAL", "CKMB", "CKMBINDEX", "TROPONINI" in the last 168 hours.  HbA1C: Hemoglobin A1C  Date/Time Value Ref Range Status  06/16/2012 10:17 AM 7.2 (H) 4.2 - 6.3 % Final    Comment:    The American Diabetes Association recommends that a primary goal of therapy should be <7% and that physicians should reevaluate the treatment regimen in patients with HbA1c values consistently >8%.    Hgb A1c MFr Bld  Date/Time Value Ref Range Status  09/24/2022 05:44 PM 9.8 (H) 4.8 - 5.6 % Final    Comment:    (NOTE) Pre diabetes:          5.7%-6.4%  Diabetes:              >6.4%  Glycemic control for   <7.0% adults with diabetes   08/08/2021 01:43 PM 7.4 (H) 4.8 - 5.6 % Final    Comment:    (NOTE) Pre diabetes:          5.7%-6.4%  Diabetes:              >6.4%  Glycemic control for   <7.0% adults with diabetes     CBG: Recent Labs  Lab 10/09/22 0042 10/09/22 0332 10/09/22 0733 10/09/22 1139 10/09/22 1400  GLUCAP 289* 169* 127* 236* 228*     Surgical History:   Past Surgical History:  Procedure Laterality Date   CARDIOVERSION N/A 12/22/2018   Procedure: CARDIOVERSION (CATH LAB);  Surgeon: Corey Skains, MD;  Location: [facility] ORS;  Service: Cardiovascular;  Laterality: N/A;   CARDIOVERSION N/A 08/18/2021   Procedure:  CARDIOVERSION;  Surgeon: Corey Skains, MD;  Location: [facility] ORS;  Service: Cardiovascular;  Laterality: N/A;   CARDIOVERSION N/A 03/11/2022   Procedure: CARDIOVERSION;  Surgeon: Corey Skains, MD;  Location: [facility] ORS;  Service: Cardiovascular;  Laterality: N/A;   CARDIOVERSION N/A 03/31/2022   Procedure: CARDIOVERSION;  Surgeon: Corey Skains, MD;  Location: [facility] ORS;  Service: Cardiovascular;  Laterality: N/A;   CHOLECYSTECTOMY N/A 09/24/2022   Procedure: LAPAROSCOPIC CHOLECYSTECTOMY;  Surgeon: Jules Husbands, MD;  Location: [facility] ORS;  Service: General;  Laterality: N/A;   COLONOSCOPY WITH PROPOFOL N/A 09/08/2022   Procedure: COLONOSCOPY WITH PROPOFOL;  Surgeon: Lucilla Lame, MD;  Location[facility] ENDOSCOPY;  Service: Endoscopy;  Laterality: N/A;   ELECTROPHYSIOLOGIC STUDY N/A 07/09/2016   Procedure: CARDIOVERSION;  Surgeon: Dionisio David, MD;  Location: [facility] ORS;  Service: Cardiovascular;  Laterality: N/A;   LAPAROSCOPIC RIGHT COLECTOMY N/A 09/24/2022   Procedure: LAPAROSCOPIC RIGHT COLECTOMY, RNFA to assist;  Surgeon: Jules Husbands, MD;  Location: [facility] ORS;  Service: General;  Laterality: N/A;   LAPAROTOMY N/A 09/28/2022   Procedure: EXPLORATORY LAPAROTOMY WITH OSTOMY CREATION;  Surgeon: Jules Husbands, MD;  Location: [facility] ORS;  Service: General;  Laterality: N/A;   TEE WITHOUT CARDIOVERSION N/A 07/09/2016   Procedure: TRANSESOPHAGEAL ECHOCARDIOGRAM (TEE);  Surgeon: Dionisio David, MD;  Location: [facility] ORS;  Service: Cardiovascular;  Laterality: N/A;   web fingers repaired     as a child     No Known Allergies   Home Medications  Prior to Admission medications   Medication Sig Start Date End Date Taking? Authorizing Provider  bisacodyl (DULCOLAX) 5 MG EC tablet Take all 4 tablets at 8 am the morning prior to your surgery. 09/14/22  Yes Pabon, Marjory Lies, MD  Cholecalciferol (VITAMIN  D) 50 MCG (2000 UT) CAPS Take 2,000 Units by mouth daily.   Yes [provider]   flecainide (TAMBOCOR) 100 MG tablet Take 100 mg by mouth 2 (two) times daily. 03/17/22  Yes [provider]  glipiZIDE (GLUCOTROL XL) 10 MG 24 hr tablet Take 10 mg by mouth 2 (two) times daily.    Yes [provider]  metFORMIN (GLUCOPHAGE-XR) 500 MG 24 hr tablet Take 500 mg by mouth daily with breakfast. 02/10/21  Yes [provider]  metoprolol (LOPRESSOR) 100 MG tablet Take 100 mg by mouth 2 (two) times daily.   Yes [provider]  metroNIDAZOLE (FLAGYL) 500 MG tablet Take 2 tablets at 8AM, take 2 tablets at [facility], and take 2 tablets at 8PM the day prior to your surgery 09/14/22  Yes Pabon, Diego F, MD  Multiple Vitamins-Minerals (MULTIVITAMIN WITH MINERALS) tablet Take 1 tablet by mouth daily with breakfast.    Yes [provider]  neomycin (MYCIFRADIN) 500 MG tablet Take 2 tablet at 8am, take 2 tablets at 2pm, and take 2 tablets at 8pm the day prior to your surgery 09/14/22  Yes Pabon, Diego F, MD  Omega-3 Fatty Acids (FISH OIL) 1000 MG CAPS Take 1,000 mg by mouth daily.   Yes [provider]  OZEMPIC, 1 MG/DOSE, 4 MG/3ML SOPN Inject 1 mg into the skin every Wednesday. Patient stopped taking these medications 08/02/21  Yes [provider]  polyethylene glycol powder (MIRALAX) 17 GM/SCOOP powder Mix full container in 64 ounces of Gatorade or other clear liquid. NO Red 09/14/22  Yes Pabon, [place], MD  Turmeric 400 MG CAPS Take 400 mg by mouth daily.   Yes [provider]  vitamin B-12 (CYANOCOBALAMIN) 100 MCG tablet Take 500 mcg by mouth daily.   Yes [provider]  Vitamin E 400 units TABS Take 400 Units by mouth daily.   Yes [provider]  apixaban (ELIQUIS) 5 MG TABS tablet Take 5 mg by mouth 2 (two) times daily.    [provider]  Coenzyme Q10 (COQ10) 100 MG CAPS Take 100 mg by mouth daily.    [provider]       Critical Care Time devoted to patient care services described in this  note is 65 minutes.     Corrin Parker, M.D.  [facility]  Medical Director [place] Director [facility] Cardio-Pulmonary Department

## 2022-10-09 NOTE — Progress Notes (Signed)
Date of Admission:  09/24/2022      ID: Nicolas Zuniga is a 66 y.o. male Principal Problem:   Adenocarcinoma of colon ([place]) Active Problems:   Diabetes ([place])   Essential hypertension   Paroxysmal A-fib (HCC)   S/P right colectomy   S/P partial resection of colon   Hyponatremia   AKI (acute kidney injury) ([place])   Postoperative intra-abdominal abscess   Persistent atrial fibrillation (HCC)   Septic shock (HCC)   Diabetic ketoacidosis without coma associated with type 2 diabetes mellitus (HCC)   Generalized anxiety disorder   Acute hypoxic respiratory failure (HCC)   Weakness   Microcytic anemia   Intra-abdominal abscess ([place])  Nicolas Zuniga is a 66 y.o. male with a history of HTN, Paroxysmal afib on eliquis, DM, adeno carcinoma of colon Was admitted for elective rt hemicolectomy on 09/24/22. The attempted lap colectomy was converted to laparotomy. HE also underwent cholecystectomy for chronic cholecystitis He started having increasing  cr, wbc and repeat cts were done' He was taken back for surgery on 09/28/22 and underwent exp laparotomy, abdominal washout, blake drain placement and diverting loop ileostomy . There was no leak identified. Midline laparotomy was allowed to heal by secondary intention Pt had a foley 09/28/22 and PICC placed on 09/27/22 Started on zosyn since 09/27/22 He was in ICU and remained intubated until 09/30/22 - weaned off pressors Because of worsening WBC he had repeat CT on 10/28 which showed multiple collections in the abdomen Meropenem was started and zosyn Dc 'today intervention radiology placed a drain on the rt upper quadrant collection and aspirated 2 more collections and sent all three for culture Pt has been having shallow breathing and lethargy since then and was transferred to ICU and was intubated, got extubated 2 days ago .   Subjective:   Wife at bed side Back to ICU because of lethargy, sob, afib   Medications:   acetaminophen  1,000  mg Oral Q6H   amiodarone  200 mg Per Tube Daily   Chlorhexidine Gluconate Cloth  6 each Topical Daily   feeding supplement  237 mL Oral TID BM   feeding supplement (PROSource TF20)  60 mL Per Tube Daily   free water  30 mL Per Tube Q4H   insulin aspart  0-20 Units Subcutaneous Q4H   insulin aspart  8 Units Subcutaneous Q4H   insulin detemir  18 Units Subcutaneous BID   loperamide  2 mg Oral BID   metoprolol tartrate  50 mg Oral BID   pantoprazole (PROTONIX) IV  40 mg Intravenous Q12H   potassium & sodium phosphates  2 packet Per Tube TID AC & HS   potassium chloride  40 mEq Per Tube Q4H   sodium chloride flush  10-40 mL Intracatheter Q12H   sodium chloride flush  5 mL Intracatheter Q8H   traZODone  25 mg Oral QHS    Objective: Vital signs in last 24 hours: Patient Vitals for the past 24 hrs:  BP Temp Temp src Pulse Resp SpO2 Weight  10/09/22 0731 139/63 98.3 F (36.8 C) Oral 80 16 96 % --  10/09/22 0500 -- -- -- -- -- -- 126.4 kg  10/09/22 0335 136/61 (!) 97.4 F (36.3 C) -- 80 17 96 % --  10/08/22 2127 132/62 -- -- 86 -- -- --  10/08/22 1951 (!) 145/61 98.3 F (36.8 C) -- 85 18 94 % --  10/08/22 1515 (!) 128/59 98.2 F (36.8 C) Oral 79 20  96 % --    PHYSICAL EXAM:  General: lethargic, responds to some questions  Lungs: b/l air entry Heart: s1s2- was irrgeular and rapid a few min ago apparently Abdomen: surgical dressing over the abdominal surgical wound- rt upper quadrant drain 'ileostomy JP drain left side Extremities: atraumatic, no cyanosis. No edema. No clubbing Skin: No rashes or lesions. Or bruising Lymph: Cervical, supraclavicular normal. Neurologic: Grossly non-focal  Lab Results Recent Labs    10/08/22 0554 10/09/22 0613  WBC 20.6* 17.6*  HGB 7.3* 6.0*  HCT 24.1* 19.1*  NA 142 144  K 4.0 3.1*  CL 110 116*  CO2 27 23  BUN 29* 22  CREATININE 1.20 0.86   Microbiology: 10/05/22 Abdominal fluid X3 pending Rt perihepatic - No gorwth so far Left  anteriro abdominal - NG Left post abdominal- yeast  Studies/Results: DG Abd Portable 1V  Result Date: 10/08/2022 CLINICAL DATA:  Placement of feeding tube EXAM: PORTABLE ABDOMEN - 1 VIEW COMPARISON:  Previous studies including the examination done earlier today FINDINGS: Tip of feeding tube is seen in the region of distal antrum of the stomach. Bowel gas pattern is nonspecific. Surgical drain is seen in right mid abdomen. IMPRESSION: Tip of feeding tube is seen in the distal antrum of the stomach. Electronically Signed   By: Elmer Picker M.D.   On: 10/08/2022 17:29   DG Abd Portable 1V  Result Date: 10/08/2022 CLINICAL DATA:  [id number] Encounter for feeding tube placement [id number] EXAM: PORTABLE ABDOMEN - 1 VIEW COMPARISON:  Radiograph 11/31/2023 FINDINGS: Feeding tube has been slightly retracted, tip overlying the proximal stomach, just beyond the GE junction. Nonobstructive bowel gas pattern. Field of view includes the left upper abdomen. IMPRESSION: Feeding tube is been slightly retracted, tip overlying the proximal stomach just beyond the GE junction, would consider advancement. Electronically Signed   By: Maurine Simmering M.D.   On: 10/08/2022 16:06     Assessment/Plan: Adenocarcinoma of the rt colon- s/p hemicolectomy  Intraabdominal abscesses   Exp lap on 09/28/22- no leak identified- but purulent fluid rt upper quadrant and 2 other collections aspirated- sent for culture Candida albicans in the post colon culture Continue meropenem Micafungin- as pt on amiodarone cannot switch to fluconazole( risk for QT prolongation)  Leucocytosis  improving   Acute hypoxic resp failure with hypercapnia - r/o medication effect/ aspiration Pt was intubated,  now extubated As patient now is lethargic and shallow breathing recommend checking ABG   Acute encephalopathy- likely due to the above, co2 narcosis-- improved   AFIB with RVR-intermittent   Anemia- worsening- as patient on heparin to r/o  bleed   AKI- resolved  Hypokalemia  Discussed the management with  wife and  care team  ID will follow him peripherally this weekend- [facility] on call for urgent issues

## 2022-10-09 NOTE — TOC Progression Note (Addendum)
Transition of Care [facility]) - Progression Note    Patient Details  Name: Nicolas Zuniga MRN: [id number] Date of Birth: 1956-05-07  Transition of Care [facility]) CM/SW [place], LCSW Phone Number: 10/09/2022, 10:09 AM  Clinical Narrative:  Left voicemail for [facility] to confirm they received referral that was faxed over yesterday.   1:34 pm: Received call back from [facility]. They have concerns about patients ability to tolerate therapy and asked that CSW send updated therapy notes on Monday. Also, if patient requires chemo or radiation at discharge he will have to postpone until after rehab.  Expected Discharge Plan: [place] Barriers to Discharge: Continued Medical Work up  Expected Discharge Plan and Services Expected Discharge Plan: [place]   Discharge Planning Services: CM Consult Post Acute Care Choice: [place] Living arrangements for the past 2 months: Single Family Home                                       Social Determinants of Health (SDOH) Interventions    Readmission Risk Interventions    09/30/2022    5:08 PM  Readmission Risk Prevention Plan  Transportation Screening Complete  PCP or Specialist Appt within 3-5 Days Complete  HRI or [place] Complete  Social Work Consult for [place] Planning/Counseling Complete  Palliative Care Screening Not Applicable  Medication Review [employer]) Complete

## 2022-10-09 NOTE — Progress Notes (Signed)
Pt seen and examined Again. Went into A fib w RVR. Moved to ICU Abdomen w/o changes, serous fluid, ostomy w/o active bleeding. Good output.  Plan to get CT A/P to evaluate potential bleeding. Oncology consulted , Dr. Janese Banks does not think this is carcinoid crisis  but she will further evaluate the pt. I do not think there is role for surgical intervention  Plan to hold heparin drip and tube feeds for now Stat CT A/P D/W critical care team, hospitalist and oncology team in detail/

## 2022-10-09 NOTE — Plan of Care (Signed)
Problem: Education: Goal: Ability to describe self-care measures that may prevent or decrease complications (Diabetes Survival Skills Education) will improve Outcome: Progressing Goal: Individualized Educational Video(s) Outcome: Progressing   Problem: Coping: Goal: Ability to adjust to condition or change in health will improve Outcome: Progressing   Problem: Fluid Volume: Goal: Ability to maintain a balanced intake and output will improve Outcome: Progressing   Problem: Health Behavior/Discharge Planning: Goal: Ability to identify and utilize available resources and services will improve Outcome: Progressing Goal: Ability to manage health-related needs will improve Outcome: Progressing   Problem: Metabolic: Goal: Ability to maintain appropriate glucose levels will improve Outcome: Progressing   Problem: Nutritional: Goal: Maintenance of adequate nutrition will improve Outcome: Progressing Goal: Progress toward achieving an optimal weight will improve Outcome: Progressing   Problem: Skin Integrity: Goal: Risk for impaired skin integrity will decrease Outcome: Progressing   Problem: Tissue Perfusion: Goal: Adequacy of tissue perfusion will improve Outcome: Progressing   Problem: Education: Goal: Knowledge of General Education information will improve Description: Including pain rating scale, medication(s)/side effects and non-pharmacologic comfort measures Outcome: Progressing   Problem: Health Behavior/Discharge Planning: Goal: Ability to manage health-related needs will improve Outcome: Progressing   Problem: Clinical Measurements: Goal: Ability to maintain clinical measurements within normal limits will improve Outcome: Progressing Goal: Will remain free from infection Outcome: Progressing Goal: Diagnostic test results will improve Outcome: Progressing Goal: Respiratory complications will improve Outcome: Progressing Goal: Cardiovascular complication will  be avoided Outcome: Progressing   Problem: Activity: Goal: Risk for activity intolerance will decrease Outcome: Progressing   Problem: Nutrition: Goal: Adequate nutrition will be maintained Outcome: Progressing   Problem: Coping: Goal: Level of anxiety will decrease Outcome: Progressing   Problem: Elimination: Goal: Will not experience complications related to bowel motility Outcome: Progressing Goal: Will not experience complications related to urinary retention Outcome: Progressing   Problem: Pain Managment: Goal: General experience of comfort will improve Outcome: Progressing   Problem: Safety: Goal: Ability to remain free from injury will improve Outcome: Progressing   Problem: Skin Integrity: Goal: Risk for impaired skin integrity will decrease Outcome: Progressing   Problem: Education: Goal: Knowledge of disease or condition will improve Outcome: Progressing Goal: Understanding of medication regimen will improve Outcome: Progressing Goal: Individualized Educational Video(s) Outcome: Progressing   Problem: Activity: Goal: Ability to tolerate increased activity will improve Outcome: Progressing   Problem: Cardiac: Goal: Ability to achieve and maintain adequate cardiopulmonary perfusion will improve Outcome: Progressing   Problem: Health Behavior/Discharge Planning: Goal: Ability to safely manage health-related needs after discharge will improve Outcome: Progressing   Problem: Education: Goal: Ability to describe self-care measures that may prevent or decrease complications (Diabetes Survival Skills Education) will improve Outcome: Progressing Goal: Individualized Educational Video(s) Outcome: Progressing   Problem: Cardiac: Goal: Ability to maintain an adequate cardiac output will improve Outcome: Progressing   Problem: Health Behavior/Discharge Planning: Goal: Ability to identify and utilize available resources and services will improve Outcome:  Progressing Goal: Ability to manage health-related needs will improve Outcome: Progressing   Problem: Fluid Volume: Goal: Ability to achieve a balanced intake and output will improve Outcome: Progressing   Problem: Metabolic: Goal: Ability to maintain appropriate glucose levels will improve Outcome: Progressing   Problem: Nutritional: Goal: Maintenance of adequate nutrition will improve Outcome: Progressing Goal: Maintenance of adequate weight for body size and type will improve Outcome: Progressing   Problem: Respiratory: Goal: Will regain and/or maintain adequate ventilation Outcome: Progressing   Problem: Urinary Elimination: Goal: Ability to achieve and maintain  adequate renal perfusion and functioning will improve Outcome: Progressing   Problem: Activity: Goal: Ability to tolerate increased activity will improve Outcome: Progressing

## 2022-10-09 NOTE — Consult Note (Signed)
Physical Medicine and Rehabilitation Consult Reason for Consult: Debility Referring Physician: Sharlyn Bologna, MD   HPI: Nicolas Zuniga is a 66 y.o. male who is post-ops/p exploratory laparotomy, abdominal washout, and diverting loop ileostomy for intra-abdominal purulent fluid after an initial open right colectomy with open cholecystectomy for right colon cancer and cholelithiasis on 10/19, RUQ drain was placed recently and patient required intubation. He was extubated on 10/31. Currently declining transfers until his pain is better controlled. Physical Medicine & Rehabilitation was consulted to assess candidacy for CIR.     ROS +postoperative pain Past Medical History:  Diagnosis Date   Adenocarcinoma of colon ([place]) 09/08/2022   Cholelithiasis    Chronic gastritis    Colon polyps    Coronary artery calcification seen on CT scan    Diastolic dysfunction    a.) TTE 04/09/2022: EF >55%, triv TR, G1DD   Diverticulosis    Hypertension    Long term current use of anticoagulant    a.) apixaban   Nephrolithiasis    Paroxysmal A-fib ([place])    a.) CHA2DS2VASc = 3 (age, HTN, T2DM);  b.) s/p DCCV 07/09/2016 (100 J x1), 12/22/2018 (120 J x1), 08/18/2021 (120 J x1), 03/11/2022 (120 J x1), 03/31/2022 (120 J x1); c.) rate/rhythm maintained on oral flecanide + metoprolol succinate; chronically anticoagulated with apixaban   T2DM (type 2 diabetes mellitus) ([place])    Past Surgical History:  Procedure Laterality Date   CARDIOVERSION N/A 12/22/2018   Procedure: CARDIOVERSION (CATH LAB);  Surgeon: Corey Skains, MD;  Location: [facility];  Service: Cardiovascular;  Laterality: N/A;   CARDIOVERSION N/A 08/18/2021   Procedure: CARDIOVERSION;  Surgeon: Corey Skains, MD;  Location: [facility];  Service: Cardiovascular;  Laterality: N/A;   CARDIOVERSION N/A 03/11/2022   Procedure: CARDIOVERSION;  Surgeon: Corey Skains, MD;  Location: [facility];  Service: Cardiovascular;  Laterality: N/A;    CARDIOVERSION N/A 03/31/2022   Procedure: CARDIOVERSION;  Surgeon: Corey Skains, MD;  Location: [facility];  Service: Cardiovascular;  Laterality: N/A;   CHOLECYSTECTOMY N/A 09/24/2022   Procedure: LAPAROSCOPIC CHOLECYSTECTOMY;  Surgeon: Jules Husbands, MD;  Location: [facility];  Service: General;  Laterality: N/A;   COLONOSCOPY WITH PROPOFOL N/A 09/08/2022   Procedure: COLONOSCOPY WITH PROPOFOL;  Surgeon: Lucilla Lame, MD;  Location: [facility];  Service: Endoscopy;  Laterality: N/A;   ELECTROPHYSIOLOGIC STUDY N/A 07/09/2016   Procedure: CARDIOVERSION;  Surgeon: Dionisio David, MD;  Location: [facility];  Service: Cardiovascular;  Laterality: N/A;   LAPAROSCOPIC RIGHT COLECTOMY N/A 09/24/2022   Procedure: LAPAROSCOPIC RIGHT COLECTOMY, RNFA to assist;  Surgeon: Jules Husbands, MD;  Location: [facility];  Service: General;  Laterality: N/A;   LAPAROTOMY N/A 09/28/2022   Procedure: EXPLORATORY LAPAROTOMY WITH OSTOMY CREATION;  Surgeon: Jules Husbands, MD;  Location: [facility];  Service: General;  Laterality: N/A;   TEE WITHOUT CARDIOVERSION N/A 07/09/2016   Procedure: TRANSESOPHAGEAL ECHOCARDIOGRAM (TEE);  Surgeon: Dionisio David, MD;  Location: [facility];  Service: Cardiovascular;  Laterality: N/A;   web fingers repaired     as a child   Family History  Problem Relation Age of Onset   Varicose Veins Mother    Heart disease Mother    Diabetes Mother    COPD Father    Social History:  reports that he has never smoked. He has never been exposed to tobacco smoke. He has never used smokeless tobacco. He reports that he does not drink alcohol  and does not use drugs. Allergies: No Known Allergies Medications Prior to Admission  Medication Sig Dispense Refill   bisacodyl (DULCOLAX) 5 MG EC tablet Take all 4 tablets at 8 am the morning prior to your surgery. 4 tablet 0   Cholecalciferol (VITAMIN D) 50 MCG (2000 UT) CAPS Take 2,000 Units by mouth daily.     flecainide (TAMBOCOR) 100 MG tablet Take  100 mg by mouth 2 (two) times daily.     glipiZIDE (GLUCOTROL XL) 10 MG 24 hr tablet Take 10 mg by mouth 2 (two) times daily.      metFORMIN (GLUCOPHAGE-XR) 500 MG 24 hr tablet Take 500 mg by mouth daily with breakfast.     metoprolol (LOPRESSOR) 100 MG tablet Take 100 mg by mouth 2 (two) times daily.     metroNIDAZOLE (FLAGYL) 500 MG tablet Take 2 tablets at 8AM, take 2 tablets at [facility], and take 2 tablets at 8PM the day prior to your surgery 6 tablet 0   Multiple Vitamins-Minerals (MULTIVITAMIN WITH MINERALS) tablet Take 1 tablet by mouth daily with breakfast.      neomycin (MYCIFRADIN) 500 MG tablet Take 2 tablet at 8am, take 2 tablets at 2pm, and take 2 tablets at 8pm the day prior to your surgery 6 tablet 0   Omega-3 Fatty Acids (FISH OIL) 1000 MG CAPS Take 1,000 mg by mouth daily.     OZEMPIC, 1 MG/DOSE, 4 MG/3ML SOPN Inject 1 mg into the skin every Wednesday. Patient stopped taking these medications     polyethylene glycol powder (MIRALAX) 17 GM/SCOOP powder Mix full container in 64 ounces of Gatorade or other clear liquid. NO Red 238 g 0   Turmeric 400 MG CAPS Take 400 mg by mouth daily.     vitamin B-12 (CYANOCOBALAMIN) 100 MCG tablet Take 500 mcg by mouth daily.     Vitamin E 400 units TABS Take 400 Units by mouth daily.     apixaban (ELIQUIS) 5 MG TABS tablet Take 5 mg by mouth 2 (two) times daily.     Coenzyme Q10 (COQ10) 100 MG CAPS Take 100 mg by mouth daily.      Home: Home Living Family/patient expects to be discharged to:: Private residence Living Arrangements: Spouse/significant other Available Help at Discharge: Family, Available 24 hours/day Type of Home: House Home Access: Stairs to enter Technical brewer of Steps: 3 Entrance Stairs-Rails: Right Home Layout: One level Bathroom Shower/Tub: Insurance claims handler: None  Lives With: Spouse  Functional History: Prior Function Prior Level of Function : Independent/Modified Independent,  Working/employed Mobility Comments: indep, driving ADLs Comments: indep Functional Status:  Mobility: Bed Mobility Overal bed mobility: Needs Assistance Bed Mobility: Rolling, Supine to Sit, Sit to Supine Rolling: Mod assist Sidelying to sit: Mod assist Supine to sit: Mod assist Sit to supine: Mod assist General bed mobility comments: assist for trunk 2/2 abdominal pain Transfers Overall transfer level: Needs assistance Equipment used: Rolling walker (2 wheels) Transfers: Bed to chair/wheelchair/BSC Sit to Stand: Mod assist Bed to/from chair/wheelchair/BSC transfer type:: Lateral/scoot transfer Step pivot transfers: Min assist, +2 safety/equipment  Lateral/Scoot Transfers: Min assist General transfer comment: along EOB Ambulation/Gait Ambulation/Gait assistance: Min assist, +2 safety/equipment Gait Distance (Feet): 3 Feet Assistive device: Rolling walker (2 wheels) Gait Pattern/deviations: Step-to pattern, Decreased step length - right, Decreased step length - left, Trunk flexed, Decreased stride length General Gait Details: patient stood edge of bed x several minutes then stepped to recliner Gait velocity: decr    ADL: ADL  Overall ADL's : Needs assistance/impaired General ADL Comments: SETUP + SUPERVISION self-feeding seated EOB. MAX A don B socks seated  Cognition: Cognition Overall Cognitive Status: Within Functional Limits for tasks assessed Orientation Level: Oriented X4 Cognition Arousal/Alertness: Awake/alert Behavior During Therapy: WFL for tasks assessed/performed, Flat affect Overall Cognitive Status: Within Functional Limits for tasks assessed General Comments: requires encouragement  Blood pressure 121/76, pulse (!) 127, temperature 97.9 F (36.6 C), resp. rate (!) 23, height '6\' 2"'$  (1.88 m), weight 126.4 kg, SpO2 95 %. Physical Exam Gen: no distress, normal appearing, BMI 35.78 HEENT: oral mucosa pink and moist, NCAT Cardio: Tachycardic to 127 Chest:  normal effort, normal rate of breathing, tachypneic to 23 Abd: soft, non-distended Ext: no edema Psych: pleasant, normal affect Skin: intact Neuro: Alert and oriented x3  Results for orders placed or performed during the hospital encounter of 09/24/22 (from the past 24 hour(s))  Glucose, capillary     Status: Abnormal   Collection Time: 10/08/22  3:48 PM  Result Value Ref Range   Glucose-Capillary 139 (H) 70 - 99 mg/dL   Comment 1 Notify RN    Comment 2 Document in Chart   Heparin level (unfractionated)     Status: Abnormal   Collection Time: 10/08/22  6:11 PM  Result Value Ref Range   Heparin Unfractionated <0.10 (L) 0.30 - 0.70 IU/mL  Glucose, capillary     Status: Abnormal   Collection Time: 10/08/22  7:52 PM  Result Value Ref Range   Glucose-Capillary 176 (H) 70 - 99 mg/dL  Glucose, capillary     Status: Abnormal   Collection Time: 10/09/22 12:42 AM  Result Value Ref Range   Glucose-Capillary 289 (H) 70 - 99 mg/dL  Glucose, capillary     Status: Abnormal   Collection Time: 10/09/22  3:32 AM  Result Value Ref Range   Glucose-Capillary 169 (H) 70 - 99 mg/dL  CBC with Differential/Platelet     Status: Abnormal   Collection Time: 10/09/22  6:13 AM  Result Value Ref Range   WBC 17.6 (H) 4.0 - 10.5 K/uL   RBC 2.44 (L) 4.22 - 5.81 MIL/uL   Hemoglobin 6.0 (L) 13.0 - 17.0 g/dL   HCT 19.1 (L) 39.0 - 52.0 %   MCV 78.3 (L) 80.0 - 100.0 fL   MCH 24.6 (L) 26.0 - 34.0 pg   MCHC 31.4 30.0 - 36.0 g/dL   RDW 17.4 (H) 11.5 - 15.5 %   Platelets 369 150 - 400 K/uL   nRBC 0.0 0.0 - 0.2 %   Neutrophils Relative % 78 %   Neutro Abs 13.8 (H) 1.7 - 7.7 K/uL   Lymphocytes Relative 10 %   Lymphs Abs 1.7 0.7 - 4.0 K/uL   Monocytes Relative 10 %   Monocytes Absolute 1.7 (H) 0.1 - 1.0 K/uL   Eosinophils Relative 1 %   Eosinophils Absolute 0.1 0.0 - 0.5 K/uL   Basophils Relative 0 %   Basophils Absolute 0.1 0.0 - 0.1 K/uL   Immature Granulocytes 1 %   Abs Immature Granulocytes 0.24 (H) 0.00  - 0.07 K/uL  Phosphorus     Status: Abnormal   Collection Time: 10/09/22  6:13 AM  Result Value Ref Range   Phosphorus 1.8 (L) 2.5 - 4.6 mg/dL  Basic metabolic panel     Status: Abnormal   Collection Time: 10/09/22  6:13 AM  Result Value Ref Range   Sodium 144 135 - 145 mmol/L   Potassium 3.1 (L) 3.5 - 5.1  mmol/L   Chloride 116 (H) 98 - 111 mmol/L   CO2 23 22 - 32 mmol/L   Glucose, Bld 96 70 - 99 mg/dL   BUN 22 8 - 23 mg/dL   Creatinine, Ser 0.86 0.61 - 1.24 mg/dL   Calcium 6.8 (L) 8.9 - 10.3 mg/dL   GFR, Estimated >60 >60 mL/min   Anion gap 5 5 - 15  Heparin level (unfractionated)     Status: Abnormal   Collection Time: 10/09/22  6:13 AM  Result Value Ref Range   Heparin Unfractionated <0.10 (L) 0.30 - 0.70 IU/mL  Protime-INR     Status: Abnormal   Collection Time: 10/09/22  6:13 AM  Result Value Ref Range   Prothrombin Time 18.2 (H) 11.4 - 15.2 seconds   INR 1.5 (H) 0.8 - 1.2  APTT     Status: Abnormal   Collection Time: 10/09/22  6:13 AM  Result Value Ref Range   aPTT 51 (H) 24 - 36 seconds  Glucose, capillary     Status: Abnormal   Collection Time: 10/09/22  7:33 AM  Result Value Ref Range   Glucose-Capillary 127 (H) 70 - 99 mg/dL   Comment 1 Notify RN    Comment 2 Document in Chart   Prepare RBC (crossmatch)     Status: None   Collection Time: 10/09/22  8:37 AM  Result Value Ref Range   Order Confirmation      ORDER PROCESSED BY BLOOD BANK Performed at [facility], [address] [place], [place] [address]   Type and screen [place]     Status: None (Preliminary result)   Collection Time: 10/09/22 11:31 AM  Result Value Ref Range   ABO/RH(D) O NEG    Antibody Screen NEG    Sample Expiration      10/12/2022,2359 Performed at [facility], [address], [place], [place] [address]    Unit Number [id number]    Blood Component Type RBC LR PHER2    Unit division 00    Status of Unit ALLOCATED    Transfusion  Status OK TO TRANSFUSE    Crossmatch Result COMPATIBLE   Glucose, capillary     Status: Abnormal   Collection Time: 10/09/22 11:39 AM  Result Value Ref Range   Glucose-Capillary 236 (H) 70 - 99 mg/dL   DG Abd Portable 1V  Result Date: 10/08/2022 CLINICAL DATA:  Placement of feeding tube EXAM: PORTABLE ABDOMEN - 1 VIEW COMPARISON:  Previous studies including the examination done earlier today FINDINGS: Tip of feeding tube is seen in the region of distal antrum of the stomach. Bowel gas pattern is nonspecific. Surgical drain is seen in right mid abdomen. IMPRESSION: Tip of feeding tube is seen in the distal antrum of the stomach. Electronically Signed   By: [place] Picker M.D.   On: 10/08/2022 17:29   DG Abd Portable 1V  Result Date: 10/08/2022 CLINICAL DATA:  [id number] Encounter for feeding tube placement [id number] EXAM: PORTABLE ABDOMEN - 1 VIEW COMPARISON:  Radiograph 11/31/2023 FINDINGS: Feeding tube has been slightly retracted, tip overlying the proximal stomach, just beyond the GE junction. Nonobstructive bowel gas pattern. Field of view includes the left upper abdomen. IMPRESSION: Feeding tube is been slightly retracted, tip overlying the proximal stomach just beyond the GE junction, would consider advancement. Electronically Signed   By: Maurine Simmering M.D.   On: 10/08/2022 16:06     Assessment/Plan: Diagnosis: Debillity Does the need for close, 24 hr/day medical  supervision in concert with the patient's rehab needs make it unreasonable for this patient to be served in a less intensive setting? Yes Co-Morbidities requiring supervision/potential complications:  Adenocarcinoma of the colon Tachycardia: will benefit from HR being monitored TID in CIR Hypertension: will benefit from blood pressure being monitored TID in CIR Postoperative pain: will discuss with patient using pain medications to assist in improving his tolerance for therapy Morbid obesity Due to bladder management, bowel  management, safety, skin/wound care, disease management, medication administration, pain management, and patient education, does the patient require 24 hr/day rehab nursing? Yes Does the patient require coordinated care of a physician, rehab nurse, therapy disciplines of PT, OT to address physical and functional deficits in the context of the above medical diagnosis(es)? Yes Addressing deficits in the following areas: balance, endurance, locomotion, strength, transferring, bowel/bladder control, bathing, dressing, feeding, grooming, toileting, and psychosocial support Can the patient actively participate in an intensive therapy program of at least 3 hrs of therapy per day at least 5 days per week? Yes The potential for patient to make measurable gains while on inpatient rehab is excellent Anticipated functional outcomes upon discharge from inpatient rehab are mod assist  with PT, min assist with OT, independent with SLP. Estimated rehab length of stay to reach the above functional goals is: 12-16 days Anticipated discharge destination: Home Overall Rehab/Functional Prognosis: good  RECOMMENDATIONS: This patient's condition is appropriate for continued rehabilitative care in the following setting: CIR if patient can demonstrate tolerance of transfers and ambulation Patient has agreed to participate in recommended program. Yes Note that insurance prior authorization may be required for reimbursement for recommended care.  Izora Ribas, MD 10/09/2022

## 2022-10-09 NOTE — Consult Note (Signed)
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 Fritzi Mandes  Date of visit: 10/09/2022    History of presenting illness-  Patient is a 66 year old male who presented initially to GI with symptoms of change in bowel habits and abdominal pain.  He underwent a colonoscopy on 09/08/2022 which showed a malignant partially obstructing tumor in the ascending colon along with 2 other subcentimeter polyps.  Biopsy showed single fragment of adenocarcinoma.  He underwent laparoscopic right colectomy and cholecystectomy on 09/24/2022.  Operative findings showed a large right colon mass adherent to the retroperitoneum postoperative course was complicated by hypotension and bradycardia as well as A-fib with RVR.  CT abdomen and pelvis in October 2022 showed small leak but no contrast extravasation.  He was on antibiotics.  There was concern for fluid collection next to the anastomosis and therefore patient was taken back to the OR for exploratory laparotomy which did not show any leak or bowel injury.  However intra-abdominal fluid was purulent with significant inflammatory response.  He underwent placement of JP drain and diverting loop colostomy.  Patient required intubation initially but was successfully extubated on 10/06/2022.  He was again found to have A-fib with RVR hypotension and anemia and transferred back to the ICU.  Presently patient is on IV micafungin for concerns of fungal infection.  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  4 out of 25 lymph nodes were positive for malignancy.  [id number].  Carcinoma positive for synaptophysin  and negative for chromogranin.  Morphologic features in conjunction with synaptophysin positivity are consistent with the diagnosis of large cell neuroendocrine carcinoma.  Within the large cell neuroendocrine carcinoma is a minor component of poorly differentiated adenocarcinoma with signet cell morphology.  Tumor consists of less than 30% poorly differentiated adenocarcinoma and therefore being classified as large cell neuroendocrine carcinoma.    ECOG PS- 4  Pain scale- 3   Review of systems- Review of Systems  Constitutional:  Positive for malaise/fatigue. Negative for chills, fever and weight loss.  HENT:  Negative for congestion, ear discharge and nosebleeds.   Eyes:  Negative for blurred vision.  Respiratory:  Negative for cough, hemoptysis, sputum production, shortness of breath and wheezing.   Cardiovascular:  Negative for chest pain, palpitations, orthopnea and claudication.  Gastrointestinal:  Negative for abdominal pain, blood in stool, constipation, diarrhea, heartburn, melena, nausea and vomiting.  Genitourinary:  Negative for dysuria, flank pain, frequency, hematuria and urgency.  Musculoskeletal:  Negative for back pain, joint pain and myalgias.  Skin:  Negative for rash.  Neurological:  Negative for dizziness, tingling, focal weakness, seizures, weakness and headaches.  Endo/Heme/Allergies:  Does not bruise/bleed easily.  Psychiatric/Behavioral:  Negative for depression and suicidal ideas. The patient does not have insomnia.     No Known Allergies  Patient Active Problem List   Diagnosis Date Noted   Intra-abdominal abscess ([place])    Acute hypoxic respiratory failure ([place]) 10/05/2022   Weakness 10/05/2022   Microcytic anemia 10/05/2022   Generalized anxiety disorder 10/04/2022   Diabetic ketoacidosis without coma associated with type 2 diabetes mellitus ([place])    Postoperative intra-abdominal abscess 09/28/2022   Adenocarcinoma of colon ([place])  Persistent atrial  fibrillation (HCC)    Septic shock (HCC)    Hyponatremia 09/25/2022   AKI (acute kidney injury) ([place]) 09/25/2022   S/P right colectomy 09/24/2022   S/P partial resection of colon 09/24/2022   Change in bowel habits    Colonic mass    Polyp of transverse colon    Diabetes mellitus type 2, uncomplicated ([place]) 93/81/0175   Kidney stones 04/30/2020   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   Bradycardia 12/30/2018   Paroxysmal A-fib (HCC) 12/14/2018   Non compliance with medical treatment 04/13/2018   History of atrial fibrillation 07/04/2012   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   Diabetes ([place]) 03/08/2007   HYPERLIPIDEMIA 03/08/2007   ANXIETY 03/08/2007   Essential hypertension 03/08/2007   HX, PERSONAL, URINARY CALCULI 03/08/2007     Past Medical History:  Diagnosis Date   Adenocarcinoma of colon ([place]) 09/08/2022   Cholelithiasis    Chronic gastritis    Colon polyps    Coronary artery calcification seen on CT scan    Diastolic dysfunction    a.) TTE 04/09/2022: EF >55%, triv TR, G1DD   Diverticulosis    Hypertension    Long term current use of anticoagulant    a.) apixaban   Nephrolithiasis    Paroxysmal A-fib ([place])    a.) CHA2DS2VASc = 3 (age, HTN, T2DM);  b.) s/p DCCV 07/09/2016 (100 J x1), 12/22/2018 (120 J x1), 08/18/2021 (120 J x1), 03/11/2022 (120 J x1), 03/31/2022 (120 J x1); c.) rate/rhythm maintained on oral flecanide + metoprolol succinate; chronically anticoagulated with apixaban   T2DM (type 2 diabetes mellitus) ([place])      Past Surgical History:  Procedure Laterality Date   CARDIOVERSION N/A 12/22/2018   Procedure: CARDIOVERSION (CATH LAB);  Surgeon: Corey Skains, MD;  Location: [facility] ORS;  Service: Cardiovascular;  Laterality: N/A;   CARDIOVERSION N/A 08/18/2021   Procedure: CARDIOVERSION;  Surgeon: Corey Skains, MD;  Location: [facility] ORS;  Service: Cardiovascular;  Laterality: N/A;   CARDIOVERSION N/A 03/11/2022   Procedure:  CARDIOVERSION;  Surgeon: Corey Skains, MD;  Location: [facility] ORS;  Service: Cardiovascular;  Laterality: N/A;   CARDIOVERSION N/A 03/31/2022   Procedure: CARDIOVERSION;  Surgeon: Corey Skains, MD;  Location: [facility] ORS;  Service: Cardiovascular;  Laterality: N/A;   CHOLECYSTECTOMY N/A 09/24/2022   Procedure: LAPAROSCOPIC CHOLECYSTECTOMY;  Surgeon: Jules Husbands, MD;  Location: [facility] ORS;  Service: General;  Laterality: N/A;   COLONOSCOPY WITH PROPOFOL N/A 09/08/2022   Procedure: COLONOSCOPY WITH PROPOFOL;  Surgeon: Lucilla Lame, MD;  Location: [facility] ENDOSCOPY;  Service: Endoscopy;  Laterality: N/A;   ELECTROPHYSIOLOGIC STUDY N/A 07/09/2016   Procedure: CARDIOVERSION;  Surgeon: Dionisio David, MD;  Location: [facility] ORS;  Service: Cardiovascular;  Laterality: N/A;   LAPAROSCOPIC RIGHT COLECTOMY N/A 09/24/2022   Procedure: LAPAROSCOPIC RIGHT COLECTOMY, RNFA to assist;  Surgeon: Jules Husbands, MD;  Location: [facility] ORS;  Service: General;  Laterality: N/A;   LAPAROTOMY N/A 09/28/2022   Procedure: EXPLORATORY LAPAROTOMY WITH OSTOMY CREATION;  Surgeon: Jules Husbands, MD;  Location: [facility] ORS;  Service: General;  Laterality: N/A;   TEE WITHOUT CARDIOVERSION N/A 07/09/2016   Procedure: TRANSESOPHAGEAL ECHOCARDIOGRAM (TEE);  Surgeon: Dionisio David, MD;  Location: [facility] ORS;  Service: Cardiovascular;  Laterality: N/A;   web fingers repaired     as a child    Social History   Socioeconomic History   Marital status: Married    Spouse name: Not on file   Number of children: Not  on file   Years of education: Not on file   Highest education level: Not on file  Occupational History   Not on file  Tobacco Use   Smoking status: Never    Passive exposure: Never   Smokeless tobacco: Never  Vaping Use   Vaping Use: Never used  Substance and Sexual Activity   Alcohol use: No   Drug use: No   Sexual activity: Not on file  Other Topics Concern   Not on file  Social History Narrative   Lives with  wife, [name], with on pet, cat.   Social Determinants of Health   Financial Resource Strain: Not on file  Food Insecurity: No Food Insecurity (09/24/2022)   Hunger Vital Sign    Worried About Running Out of Food in the Last Year: Never true    Ran Out of Food in the Last Year: Never true  Transportation Needs: No Transportation Needs (09/24/2022)   PRAPARE - Hydrologist (Medical): No    Lack of Transportation (Non-Medical): No  Physical Activity: Not on file  Stress: Not on file  Social Connections: Not on file  Intimate Partner Violence: Not At Risk (09/24/2022)   Humiliation, Afraid, Rape, and Kick questionnaire    Fear of Current or Ex-Partner: No    Emotionally Abused: No    Physically Abused: No    Sexually Abused: No     Family History  Problem Relation Age of Onset   Varicose Veins Mother    Heart disease Mother    Diabetes Mother    COPD Father      Current Facility-Administered Medications:    0.9 %  sodium chloride infusion, 250 mL, Intravenous, Continuous, Darel Hong D, NP, Stopped at 10/06/22 1416   acetaminophen (TYLENOL) tablet 1,000 mg, 1,000 mg, Oral, Q6H, Pabon, Diego F, MD, 1,000 mg at 10/09/22 5176   [COMPLETED] amiodarone (PACERONE) tablet 200 mg, 200 mg, Oral, BID, 200 mg at 10/03/22 2114 **FOLLOWED BY** amiodarone (PACERONE) tablet 200 mg, 200 mg, Per Tube, Daily, Rust-Chester, Britton L, NP, 200 mg at 10/09/22 0809   Chlorhexidine Gluconate Cloth 2 % PADS 6 each, 6 each, Topical, Daily, Pabon, Diego F, MD, 6 each at 10/09/22 0810   dextrose 50 % solution 0-50 mL, 0-50 mL, Intravenous, PRN, Teressa Lower, NP   diphenhydrAMINE (BENADRYL) injection 25 mg, 25 mg, Intravenous, QHS PRN, Fritzi Mandes, MD   insulin aspart (novoLOG) injection 0-20 Units, 0-20 Units, Subcutaneous, Q4H, Dallie Piles, RPH, 4 Units at 10/09/22 1534   insulin aspart (novoLOG) injection 8 Units, 8 Units, Subcutaneous, Q4H, Fritzi Mandes, MD, 8 Units  at 10/09/22 1149   insulin detemir (LEVEMIR) injection 18 Units, 18 Units, Subcutaneous, BID, Fritzi Mandes, MD, 18 Units at 10/09/22 0810   loperamide (IMODIUM) capsule 4 mg, 4 mg, Oral, BID, Pabon, Diego F, MD   meropenem (MERREM) 1 g in sodium chloride 0.9 % 100 mL IVPB, 1 g, Intravenous, Q8H, Val Riles, MD, Stopped at 10/09/22 1333   metoprolol tartrate (LOPRESSOR) injection 2.5 mg, 2.5 mg, Intravenous, Q2H PRN, Callwood, Dwayne D, MD   metoprolol tartrate (LOPRESSOR) tablet 50 mg, 50 mg, Oral, BID, Fritzi Mandes, MD, 50 mg at 10/09/22 0809   micafungin (MYCAMINE) 100 mg in sodium chloride 0.9 % 100 mL IVPB, 100 mg, Intravenous, Q24H, Ravishankar, Joellyn Quails, MD, Stopped at 10/09/22 0113   midodrine (PROAMATINE) tablet 2.5 mg, 2.5 mg, Oral, TID WC, Callwood, Dwayne D, MD   ondansetron (ZOFRAN-ODT)  disintegrating tablet 4 mg, 4 mg, Oral, Q6H PRN **OR** ondansetron (ZOFRAN) injection 4 mg, 4 mg, Intravenous, Q6H PRN, Pabon, Diego F, MD   Oral care mouth rinse, 15 mL, Mouth Rinse, PRN, Pabon, Diego F, MD   oxyCODONE (Oxy IR/ROXICODONE) immediate release tablet 5-10 mg, 5-10 mg, Oral, Q4H PRN, Fritzi Mandes, MD   pantoprazole (PROTONIX) injection 40 mg, 40 mg, Intravenous, Q12H, Fritzi Mandes, MD, 40 mg at 10/09/22 0809   potassium & sodium phosphates (PHOS-NAK) 280-160-250 MG packet 2 packet, 2 packet, Per Tube, TID AC & HS, Benita Gutter, RPH, 2 packet at 10/09/22 1132   prochlorperazine (COMPAZINE) tablet 10 mg, 10 mg, Oral, Q6H PRN **OR** prochlorperazine (COMPAZINE) injection 5-10 mg, 5-10 mg, Intravenous, Q6H PRN, Pabon, Diego F, MD   sodium chloride flush (NS) 0.9 % injection 10-40 mL, 10-40 mL, Intracatheter, Q12H, Pabon, Diego F, MD, 10 mL at 10/09/22 0810   sodium chloride flush (NS) 0.9 % injection 10-40 mL, 10-40 mL, Intracatheter, PRN, Pabon, Diego F, MD   sodium chloride flush (NS) 0.9 % injection 5 mL, 5 mL, Intracatheter, Q8H, Aletta Edouard, MD, 5 mL at 10/09/22 1301   traMADol  (ULTRAM) tablet 50 mg, 50 mg, Oral, Q6H PRN, Fritzi Mandes, MD   Physical exam:  Vitals:   10/09/22 1430 10/09/22 1502 10/09/22 1515 10/09/22 1600  BP: 105/61 100/75 118/64 133/67  Pulse: (!) 111 93 87   Resp: (!) 41 (!) 33 (!) 36 (!) 41  Temp:    100 F (37.8 C)  TempSrc:    Axillary  SpO2: 94% 94% 96% 94%  Weight:      Height:       Physical Exam Constitutional:      Comments: Appears fatigued  Cardiovascular:     Rate and Rhythm: Normal rate and regular rhythm.     Heart sounds: Normal heart sounds.  Pulmonary:     Effort: Pulmonary effort is normal.     Breath sounds: Normal breath sounds.  Abdominal:     Comments: Dressing in place over laparotomy wound. JP drain in place  Skin:    General: Skin is warm and dry.  Neurological:     Mental Status: He is alert and oriented to person, place, and time.           Latest Ref Rng & Units 10/09/2022    6:13 AM  CMP  Glucose 70 - 99 mg/dL 96   BUN 8 - 23 mg/dL 22   Creatinine 0.61 - 1.24 mg/dL 0.86   Sodium 135 - 145 mmol/L 144   Potassium 3.5 - 5.1 mmol/L 3.1   Chloride 98 - 111 mmol/L 116   CO2 22 - 32 mmol/L 23   Calcium 8.9 - 10.3 mg/dL 6.8       Latest Ref Rng & Units 10/09/2022    6:13 AM  CBC  WBC 4.0 - 10.5 K/uL 17.6   Hemoglobin 13.0 - 17.0 g/dL 6.0   Hematocrit 39.0 - 52.0 % 19.1   Platelets 150 - 400 K/uL 369     _0 @  DG Abd Portable 1V  Result Date: 10/08/2022 CLINICAL DATA:  Placement of feeding tube EXAM: PORTABLE ABDOMEN - 1 VIEW COMPARISON:  Previous studies including the examination done earlier today FINDINGS: Tip of feeding tube is seen in the region of distal antrum of the stomach. Bowel gas pattern is nonspecific. Surgical drain is seen in right mid abdomen. IMPRESSION: Tip of feeding tube is seen in the distal  antrum of the stomach. Electronically Signed   By: Elmer Picker M.D.   On: 10/08/2022 17:29   DG Abd Portable 1V  Result Date: 10/08/2022 CLINICAL DATA:  [id number]  Encounter for feeding tube placement [id number] EXAM: PORTABLE ABDOMEN - 1 VIEW COMPARISON:  Radiograph 11/31/2023 FINDINGS: Feeding tube has been slightly retracted, tip overlying the proximal stomach, just beyond the GE junction. Nonobstructive bowel gas pattern. Field of view includes the left upper abdomen. IMPRESSION: Feeding tube is been slightly retracted, tip overlying the proximal stomach just beyond the GE junction, would consider advancement. Electronically Signed   By: Maurine Simmering M.D.   On: 10/08/2022 16:06   DG Abd 1 View  Result Date: 10/06/2022 CLINICAL DATA:  Nasogastric tube placement. EXAM: ABDOMEN - 1 VIEW COMPARISON:  10/05/2022 FINDINGS: The previously demonstrated nasogastric tube has been replaced with a feeding tube with its tip in the proximal stomach. Normal bowel gas pattern with small amount of contrast in the stomach and bowel. Lumbar and thoracic spine degenerative changes. Mildly progressive left basilar atelectasis. IMPRESSION: 1. Feeding tube tip in the proximal stomach. 2. Mildly progressive left basilar atelectasis. Electronically Signed   By: Claudie Revering M.D.   On: 10/06/2022 12:54   DG Abd 1 View  Result Date: 10/05/2022 CLINICAL DATA:  Orogastric tube placement EXAM: ABDOMEN - 1 VIEW COMPARISON:  None Available. FINDINGS: Orogastric tube tip overlies expected proximal to mid body of the stomach. Central venous catheter tip noted within the superior vena cava. Pigtail drainage catheter overlies the right hepatic lobe. Surgical clips are seen within the right mid abdomen. Visualized abdominal gas pattern is nonspecific due to a paucity of gas within the upper abdomen. No gross free intraperitoneal gas. IMPRESSION: 1. Orogastric tube tip within the proximal to mid body of the stomach. Electronically Signed   By: Fidela Salisbury M.D.   On: 10/05/2022 20:34   DG Chest 1 View  Result Date: 10/05/2022 CLINICAL DATA:  Intubation EXAM: CHEST  1 VIEW COMPARISON:  Chest x-ray  09/28/2022 FINDINGS: Endotracheal tube tip is 3 cm above the carina. Right upper extremity PICC terminates over the distal SVC. Enteric tube extends below the diaphragm, distal tip is not included on the image. Lung volumes are low. There are patchy opacities in the left mid lung and right lung base similar to prior. No pleural effusion or pneumothorax. The cardiomediastinal silhouette is stable, the heart is enlarged. The osseous structures are unchanged. IMPRESSION: 1. Endotracheal tube tip is 3 cm above the carina. 2. Stable patchy opacities in the left mid lung and right lung base. Electronically Signed   By: Ronney Asters M.D.   On: 10/05/2022 18:51   CT GUIDED PERITONEAL/RETROPERITONEAL FLUID DRAIN BY PERC CATH  Result Date: 10/05/2022 INDICATION: Intra-abdominal fluid collections after prior right hemicolectomy and cholecystectomy for colon carcinoma and cholelithiasis. Persistent significant leukocytosis. EXAM: 1. CT-GUIDED ASPIRATION OF LEFT ANTERIOR PERITONEAL FLUID COLLECTION 2. CT-GUIDED ASPIRATION OF LEFT POSTEROLATERAL PERITONEAL FLUID COLLECTION 3. CT-GUIDED CATHETER DRAINAGE OF RIGHT PERIHEPATIC PERITONEAL FLUID COLLECTION 4. MEDICATIONS: None ANESTHESIA/SEDATION: Moderate (conscious) sedation was employed during this procedure. A total of Versed 2.0 mg and Fentanyl 100 mcg was administered intravenously by the radiology nurse. Total intra-service moderate Sedation Time: 52 minutes. The patient's level of consciousness and vital signs were monitored continuously by radiology nursing throughout the procedure under my direct supervision. COMPLICATIONS: None immediate. PROCEDURE: Informed written consent was obtained from the patient after a thorough discussion of the procedural risks, benefits and alternatives.  All questions were addressed. Maximal Sterile Barrier Technique was utilized including caps, mask, sterile gowns, sterile gloves, sterile drape, hand hygiene and skin antiseptic. A timeout  was performed prior to the initiation of the procedure. CT of the abdomen was performed in a supine position. Localizing scans were performed with grids and sites marked on the skin in the upper right abdomen and mid to lower left abdomen. From a left lateral approach, an 18 gauge trocar needle was advanced into an anterior left-sided peritoneal fluid collection. After confirming needle tip position, aspiration was performed via the trocar needle utilizing syringes. A sample was sent for culture analysis. The needle was removed after additional CT was performed. A second 18 gauge trocar needle was then advanced from a left-sided approach into a left posterolateral peritoneal fluid collection. Aspiration was performed through the trocar needle. A fluid sample was sent for culture analysis. The needle was removed after additional CT imaging. An 18 gauge trocar needle was advanced in the right upper abdomen into the lateral perihepatic space. After confirming needle tip position and aspiration of fluid, a guidewire was advanced into the fluid collection. The percutaneous tract was dilated over the guidewire and a 10 French percutaneous drainage catheter advanced into the lateral perihepatic space. Catheter position was confirmed by CT. A fluid sample was withdrawn and sent for culture analysis. The drainage catheter was attached to a suction bulb and secured at the skin with a Prolene retention suture and adhesive device. FINDINGS: Aspiration of the elongated left anterior fluid collection immediately deep to the anterior abdominal wall yielded thin, clear yellow fluid. A total volume of 90 mL of fluid was able to be aspirated from this region resulting in complete decompression of the collection by CT after aspiration. Aspiration of the teardrop shaped fluid collection along the lateral paracolic region of the peritoneal cavity yielded darker yellow fluid which was slightly turbid, especially towards the end of  aspiration. Aspiration yielded 50 mL of fluid resulting in near complete decompression of the collection by CT after aspiration. Aspiration of the right lateral perihepatic fluid collection yielded thin, clear yellow fluid. Given that this is the largest single area fluid present in the peritoneal cavity currently, it was elected to place a drainage catheter in this fluid collection. IMPRESSION: 1. CT-guided needle aspiration of left anterior peritoneal fluid collection yielded 90 mL of thin, clear yellow fluid resulting in complete decompression. A fluid sample from this collection was sent for culture analysis. 2. CT-guided needle aspiration left posterolateral paracolic fluid collection yielded 50 mL of slightly turbid yellow fluid resulting in near complete decompression. A fluid sample was sent for culture analysis. 3. CT-guided percutaneous catheter drainage was performed of the perihepatic fluid collection from a right lateral approach yielding thin, clear yellow fluid. A 10 French drainage catheter was placed and attached to suction bulb drainage. Electronically Signed   By: Aletta Edouard M.D.   On: 10/05/2022 16:22   CT GUIDED NEEDLE PLACEMENT  Result Date: 10/05/2022 INDICATION: Intra-abdominal fluid collections after prior right hemicolectomy and cholecystectomy for colon carcinoma and cholelithiasis. Persistent significant leukocytosis. EXAM: 1. CT-GUIDED ASPIRATION OF LEFT ANTERIOR PERITONEAL FLUID COLLECTION 2. CT-GUIDED ASPIRATION OF LEFT POSTEROLATERAL PERITONEAL FLUID COLLECTION 3. CT-GUIDED CATHETER DRAINAGE OF RIGHT PERIHEPATIC PERITONEAL FLUID COLLECTION 4. MEDICATIONS: None ANESTHESIA/SEDATION: Moderate (conscious) sedation was employed during this procedure. A total of Versed 2.0 mg and Fentanyl 100 mcg was administered intravenously by the radiology nurse. Total intra-service moderate Sedation Time: 52 minutes. The patient's level  of consciousness and vital signs were monitored  continuously by radiology nursing throughout the procedure under my direct supervision. COMPLICATIONS: None immediate. PROCEDURE: Informed written consent was obtained from the patient after a thorough discussion of the procedural risks, benefits and alternatives. All questions were addressed. Maximal Sterile Barrier Technique was utilized including caps, mask, sterile gowns, sterile gloves, sterile drape, hand hygiene and skin antiseptic. A timeout was performed prior to the initiation of the procedure. CT of the abdomen was performed in a supine position. Localizing scans were performed with grids and sites marked on the skin in the upper right abdomen and mid to lower left abdomen. From a left lateral approach, an 18 gauge trocar needle was advanced into an anterior left-sided peritoneal fluid collection. After confirming needle tip position, aspiration was performed via the trocar needle utilizing syringes. A sample was sent for culture analysis. The needle was removed after additional CT was performed. A second 18 gauge trocar needle was then advanced from a left-sided approach into a left posterolateral peritoneal fluid collection. Aspiration was performed through the trocar needle. A fluid sample was sent for culture analysis. The needle was removed after additional CT imaging. An 18 gauge trocar needle was advanced in the right upper abdomen into the lateral perihepatic space. After confirming needle tip position and aspiration of fluid, a guidewire was advanced into the fluid collection. The percutaneous tract was dilated over the guidewire and a 10 French percutaneous drainage catheter advanced into the lateral perihepatic space. Catheter position was confirmed by CT. A fluid sample was withdrawn and sent for culture analysis. The drainage catheter was attached to a suction bulb and secured at the skin with a Prolene retention suture and adhesive device. FINDINGS: Aspiration of the elongated left anterior  fluid collection immediately deep to the anterior abdominal wall yielded thin, clear yellow fluid. A total volume of 90 mL of fluid was able to be aspirated from this region resulting in complete decompression of the collection by CT after aspiration. Aspiration of the teardrop shaped fluid collection along the lateral paracolic region of the peritoneal cavity yielded darker yellow fluid which was slightly turbid, especially towards the end of aspiration. Aspiration yielded 50 mL of fluid resulting in near complete decompression of the collection by CT after aspiration. Aspiration of the right lateral perihepatic fluid collection yielded thin, clear yellow fluid. Given that this is the largest single area fluid present in the peritoneal cavity currently, it was elected to place a drainage catheter in this fluid collection. IMPRESSION: 1. CT-guided needle aspiration of left anterior peritoneal fluid collection yielded 90 mL of thin, clear yellow fluid resulting in complete decompression. A fluid sample from this collection was sent for culture analysis. 2. CT-guided needle aspiration left posterolateral paracolic fluid collection yielded 50 mL of slightly turbid yellow fluid resulting in near complete decompression. A fluid sample was sent for culture analysis. 3. CT-guided percutaneous catheter drainage was performed of the perihepatic fluid collection from a right lateral approach yielding thin, clear yellow fluid. A 10 French drainage catheter was placed and attached to suction bulb drainage. Electronically Signed   By: Aletta Edouard M.D.   On: 10/05/2022 16:22   CT GUIDED NEEDLE PLACEMENT  Result Date: 10/05/2022 INDICATION: Intra-abdominal fluid collections after prior right hemicolectomy and cholecystectomy for colon carcinoma and cholelithiasis. Persistent significant leukocytosis. EXAM: 1. CT-GUIDED ASPIRATION OF LEFT ANTERIOR PERITONEAL FLUID COLLECTION 2. CT-GUIDED ASPIRATION OF LEFT POSTEROLATERAL  PERITONEAL FLUID COLLECTION 3. CT-GUIDED CATHETER DRAINAGE OF RIGHT PERIHEPATIC  PERITONEAL FLUID COLLECTION 4. MEDICATIONS: None ANESTHESIA/SEDATION: Moderate (conscious) sedation was employed during this procedure. A total of Versed 2.0 mg and Fentanyl 100 mcg was administered intravenously by the radiology nurse. Total intra-service moderate Sedation Time: 52 minutes. The patient's level of consciousness and vital signs were monitored continuously by radiology nursing throughout the procedure under my direct supervision. COMPLICATIONS: None immediate. PROCEDURE: Informed written consent was obtained from the patient after a thorough discussion of the procedural risks, benefits and alternatives. All questions were addressed. Maximal Sterile Barrier Technique was utilized including caps, mask, sterile gowns, sterile gloves, sterile drape, hand hygiene and skin antiseptic. A timeout was performed prior to the initiation of the procedure. CT of the abdomen was performed in a supine position. Localizing scans were performed with grids and sites marked on the skin in the upper right abdomen and mid to lower left abdomen. From a left lateral approach, an 18 gauge trocar needle was advanced into an anterior left-sided peritoneal fluid collection. After confirming needle tip position, aspiration was performed via the trocar needle utilizing syringes. A sample was sent for culture analysis. The needle was removed after additional CT was performed. A second 18 gauge trocar needle was then advanced from a left-sided approach into a left posterolateral peritoneal fluid collection. Aspiration was performed through the trocar needle. A fluid sample was sent for culture analysis. The needle was removed after additional CT imaging. An 18 gauge trocar needle was advanced in the right upper abdomen into the lateral perihepatic space. After confirming needle tip position and aspiration of fluid, a guidewire was advanced into the  fluid collection. The percutaneous tract was dilated over the guidewire and a 10 French percutaneous drainage catheter advanced into the lateral perihepatic space. Catheter position was confirmed by CT. A fluid sample was withdrawn and sent for culture analysis. The drainage catheter was attached to a suction bulb and secured at the skin with a Prolene retention suture and adhesive device. FINDINGS: Aspiration of the elongated left anterior fluid collection immediately deep to the anterior abdominal wall yielded thin, clear yellow fluid. A total volume of 90 mL of fluid was able to be aspirated from this region resulting in complete decompression of the collection by CT after aspiration. Aspiration of the teardrop shaped fluid collection along the lateral paracolic region of the peritoneal cavity yielded darker yellow fluid which was slightly turbid, especially towards the end of aspiration. Aspiration yielded 50 mL of fluid resulting in near complete decompression of the collection by CT after aspiration. Aspiration of the right lateral perihepatic fluid collection yielded thin, clear yellow fluid. Given that this is the largest single area fluid present in the peritoneal cavity currently, it was elected to place a drainage catheter in this fluid collection. IMPRESSION: 1. CT-guided needle aspiration of left anterior peritoneal fluid collection yielded 90 mL of thin, clear yellow fluid resulting in complete decompression. A fluid sample from this collection was sent for culture analysis. 2. CT-guided needle aspiration left posterolateral paracolic fluid collection yielded 50 mL of slightly turbid yellow fluid resulting in near complete decompression. A fluid sample was sent for culture analysis. 3. CT-guided percutaneous catheter drainage was performed of the perihepatic fluid collection from a right lateral approach yielding thin, clear yellow fluid. A 10 French drainage catheter was placed and attached to suction  bulb drainage. Electronically Signed   By: Aletta Edouard M.D.   On: 10/05/2022 16:22   CT ABDOMEN PELVIS WO CONTRAST  Result Date: 10/03/2022 CLINICAL DATA:  Abdominal pain. Postop leukocytosis. Status post right hemicolectomy for colon mass on 09/24/2022. Patient was re-explored 09/28/2022 and found to have purulent fluid in the right upper quadrant with possible Peri anastomotic abscess. EXAM: CT ABDOMEN AND PELVIS WITHOUT CONTRAST TECHNIQUE: Multidetector CT imaging of the abdomen and pelvis was performed following the standard protocol without IV contrast. RADIATION DOSE REDUCTION: This exam was performed according to the departmental dose-optimization program which includes automated exposure control, adjustment of the mA and/or kV according to patient size and/or use of iterative reconstruction technique. COMPARISON:  09/28/2022 FINDINGS: Lower chest: Dependent collapse/consolidation is seen in both lungs with small bilateral pleural effusions. Hepatobiliary: No suspicious focal abnormality in the liver on this study without intravenous contrast. Gallbladder surgically absent. No intrahepatic or extrahepatic biliary dilation. Pancreas: No focal mass lesion. No dilatation of the main duct. No intraparenchymal cyst. No peripancreatic edema. Spleen: No splenomegaly. No focal mass lesion. Adrenals/Urinary Tract: No adrenal nodule or mass. Kidneys unremarkable. No evidence for hydroureter. Bladder is decompressed by Foley catheter. Gas in the bladder lumen is compatible with the instrumentation. Stomach/Bowel: Oral contrast material is seen in the stomach. Duodenum is normally positioned as is the ligament of Treitz. No small bowel wall thickening. No small bowel dilatation. Loop ileostomy noted right abdomen. Right hemicolectomy. Transverse and left colon is opacified and decompressed. Surgical drain is identified in the right abdomen adjacent to the ileocolic anastomosis. Vascular/Lymphatic: There is mild  atherosclerotic calcification of the abdominal aorta without aneurysm. There is no gastrohepatic or hepatoduodenal ligament lymphadenopathy. No retroperitoneal or mesenteric lymphadenopathy. No pelvic sidewall lymphadenopathy. Reproductive: The prostate gland and seminal vesicles are unremarkable. Other: Small volume free fluid is seen adjacent to the liver, similar to prior. Scattered collections of fluid are seen in the mesentery and bilateral paracolic gutters, also similar to prior. Volume of intraperitoneal gas has decreased in the interval. 14.7 x 2.0 cm relatively well-defined Jeani Hawking shaped collection of fluid is identified anterior abdomen (image 53/2). Open midline wound evident. Musculoskeletal: No worrisome lytic or sclerotic osseous abnormality. IMPRESSION: 1. Decreasing intraperitoneal free gas since 09/28/2022 as expected. Free fluid around the liver is similar to prior with multiple fluid collections in the mesentery in fluid pooling in the para colic gutters bilaterally. No definite abscess at this time although lack of intravenous contrast lessens sensitivity for detection. Superinfection of any of the intraperitoneal fluid collections cannot be excluded on this study. 2. Right abdominal loop ileostomy in this patient with right hemicolectomy. 3. Dependent collapse/consolidation in both lungs with small bilateral pleural effusions. 4.  Aortic Atherosclerosis (ICD10-I70.0). Electronically Signed   By: Misty Stanley M.D.   On: 10/03/2022 15:48   ECHOCARDIOGRAM COMPLETE  Result Date: 09/29/2022    ECHOCARDIOGRAM REPORT   Patient Name:   Nicolas Zuniga Date of Exam: 09/29/2022 Medical Rec #:  [id number]     Height:       74.0 in Accession #:    [id number]    Weight:       260.1 lb Date of Birth:  July 31, 1956      BSA:          2.430 m Patient Age:    33 years      BP:           126/74 mmHg Patient Gender: M             HR:           83 bpm. Exam Location:  [facility] Procedure: 2D Echo, Color  Doppler and  Cardiac Doppler Indications:     Abnormal ECG R94.31  History:         Patient has prior history of Echocardiogram examinations, most                  recent 07/09/2016. Risk Factors:Hypertension and Diabetes.                  Paroxysmal Afib.  Sonographer:     Sherrie Sport Referring Phys:  [id number] Teressa Lower Diagnosing Phys: Serafina Royals MD  Sonographer Comments: Echo performed with patient supine and on artificial respirator, Technically challenging study due to limited acoustic windows and no subcostal window. IMPRESSIONS  1. Left ventricular ejection fraction, by estimation, is 65 to 70%. The left ventricle has normal function. The left ventricle has no regional wall motion abnormalities. Left ventricular diastolic parameters were normal.  2. Right ventricular systolic function is normal. The right ventricular size is normal.  3. The mitral valve is normal in structure. Trivial mitral valve regurgitation.  4. The aortic valve is normal in structure. Aortic valve regurgitation is not visualized. FINDINGS  Left Ventricle: Left ventricular ejection fraction, by estimation, is 65 to 70%. The left ventricle has normal function. The left ventricle has no regional wall motion abnormalities. The left ventricular internal cavity size was small. There is no left ventricular hypertrophy. Left ventricular diastolic parameters were normal. Right Ventricle: The right ventricular size is normal. No increase in right ventricular wall thickness. Right ventricular systolic function is normal. Left Atrium: Left atrial size was normal in size. Right Atrium: Right atrial size was normal in size. Pericardium: There is no evidence of pericardial effusion. Mitral Valve: The mitral valve is normal in structure. Trivial mitral valve regurgitation. Tricuspid Valve: The tricuspid valve is normal in structure. Tricuspid valve regurgitation is trivial. Aortic Valve: The aortic valve is normal in structure. Aortic valve regurgitation is not  visualized. Aortic valve mean gradient measures 2.0 mmHg. Aortic valve peak gradient measures 3.4 mmHg. Aortic valve area, by VTI measures 3.50 cm. Pulmonic Valve: The pulmonic valve was normal in structure. Pulmonic valve regurgitation is not visualized. Aorta: The aortic root and ascending aorta are structurally normal, with no evidence of dilitation. IAS/Shunts: No atrial level shunt detected by color flow Doppler.  LEFT VENTRICLE PLAX 2D LVIDd:         4.60 cm LVIDs:         3.30 cm LV PW:         1.20 cm LV IVS:        0.80 cm LVOT diam:     2.00 cm LV SV:         43 LV SV Index:   18 LVOT Area:     3.14 cm  RIGHT VENTRICLE RV Basal diam:  4.10 cm RV Mid diam:    3.80 cm RV S prime:     11.40 cm/s TAPSE (M-mode): 2.0 cm LEFT ATRIUM           Index        RIGHT ATRIUM           Index LA diam:      3.10 cm 1.28 cm/m   RA Area:     14.80 cm LA Vol (A2C): 40.0 ml 16.46 ml/m  RA Volume:   37.20 ml  15.31 ml/m LA Vol (A4C): 70.1 ml 28.84 ml/m  AORTIC VALVE AV Area (Vmax):    2.22 cm AV Area (Vmean):  2.37 cm AV Area (VTI):     3.50 cm AV Vmax:           91.60 cm/s AV Vmean:          67.100 cm/s AV VTI:            0.122 m AV Peak Grad:      3.4 mmHg AV Mean Grad:      2.0 mmHg LVOT Vmax:         64.70 cm/s LVOT Vmean:        50.700 cm/s LVOT VTI:          0.136 m LVOT/AV VTI ratio: 1.11  AORTA Ao Root diam: 3.20 cm MITRAL VALVE               TRICUSPID VALVE MV Area (PHT): 3.40 cm    TR Peak grad:   19.4 mmHg MV Decel Time: 223 msec    TR Vmax:        220.00 cm/s MV E velocity: 85.90 cm/s                            SHUNTS                            Systemic VTI:  0.14 m                            Systemic Diam: 2.00 cm Serafina Royals MD Electronically signed by Serafina Royals MD Signature Date/Time: 09/29/2022/12:21:30 PM    Final    DG Abd 1 View  Result Date: 09/28/2022 CLINICAL DATA:  Enteric catheter placement, postop EXAM: ABDOMEN - 1 VIEW COMPARISON:  09/26/2022 FINDINGS: Frontal view of the  lower chest and upper abdomen demonstrates enteric catheter passing below diaphragm, tip projecting over the gastric antrum. Left basilar consolidation compatible with atelectasis. Bowel gas pattern is unremarkable. IMPRESSION: 1. Enteric catheter tip projecting over the gastric antrum. Electronically Signed   By: Randa Ngo M.D.   On: 09/28/2022 15:52   DG Chest Port 1 View  Result Date: 09/28/2022 CLINICAL DATA:  Postop, intubated EXAM: PORTABLE CHEST 1 VIEW COMPARISON:  09/27/2022 FINDINGS: Single frontal view of the chest demonstrates endotracheal tube overlying tracheal air column tip at level of thoracic inlet. Enteric catheter passes below diaphragm tip excluded by collimation. Right-sided PICC tip overlies the right atrium. Numerous cardiac leads overlie the central chest. The cardiac silhouette is unremarkable. Lung volumes are diminished, with left basilar consolidation most consistent with atelectasis. Trace left pleural effusion. No pneumothorax. No acute bony abnormalities. IMPRESSION: 1. Support devices as above. 2. Increasing left basilar consolidation, favor atelectasis over pneumonia. 3. Trace left pleural effusion. Electronically Signed   By: Randa Ngo M.D.   On: 09/28/2022 15:51   CT ABDOMEN PELVIS WO CONTRAST  Result Date: 09/28/2022 CLINICAL DATA:  Status post right hemicolectomy 09/24/2022. Anastomotic complication suspected. EXAM: CT ABDOMEN AND PELVIS WITHOUT CONTRAST TECHNIQUE: Multidetector CT imaging of the abdomen and pelvis was performed following the standard protocol without IV contrast. RADIATION DOSE REDUCTION: This exam was performed according to the departmental dose-optimization program which includes automated exposure control, adjustment of the mA and/or kV according to patient size and/or use of iterative reconstruction technique. COMPARISON:  09/27/2022 FINDINGS: Lower chest: Bibasilar atelectasis noted with tiny bilateral pleural effusions. Hepatobiliary:  No suspicious focal abnormality in the  liver on this study without intravenous contrast. Gallbladder is surgically absent. No intrahepatic or extrahepatic biliary dilation. Pancreas: No focal mass lesion. No dilatation of the main duct. No intraparenchymal cyst. No peripancreatic edema. Spleen: No splenomegaly. No focal mass lesion. Adrenals/Urinary Tract: No adrenal nodule or mass. Contrast in the kidneys compatible with residual from yesterday's infuse scan. Small cyst noted left kidney. No evidence for hydroureter. The urinary bladder appears normal for the degree of distention. Stomach/Bowel: Stomach is distended with gas and fluid. Duodenum is normally positioned as is the ligament of Treitz. No small bowel wall thickening. No small bowel dilatation. The distal ileum at the ileocolic anastomosis in the proximal transverse colon at the anastomosis is well opacified with enteric contrast material. While there is no evidence for gross contrast extravasation at the level of the anastomosis, the small fluid collection identified on yesterday's study has increased in attenuation in the interval (26 Hounsfield units previously versus 67 Hounsfield units today). This increase in attenuation is concerning for some trace spill of contrast from the anastomosis into the collection although blood products or infectious debris could cause increase in attenuation. This finding is associated with a slight increase in fluid volume in the right paracolic gutter. Diverticular changes are seen in the left colon as before. Vascular/Lymphatic: There is mild atherosclerotic calcification of the abdominal aorta without aneurysm. There is no gastrohepatic or hepatoduodenal ligament lymphadenopathy. No retroperitoneal or mesenteric lymphadenopathy. No pelvic sidewall lymphadenopathy. Reproductive: The hypoattenuating collection seen previously in the prostate gland are less prominent today. Other: Free fluid and free gas in the  peritoneal cavity is again noted. Free gas is not unexpected 4 days out from surgery. As noted above, the volume of fluid in the right paracolic gutter is slightly increased in the interval. Musculoskeletal: Gas in the midline subcutaneous tissues deep to the staple line has increased in the interval No worrisome lytic or sclerotic osseous abnormality. IMPRESSION: 1. While there is no evidence for gross enteric contrast extravasation at the level of the ileocolic anastomosis, the small fluid collection adjacent to the anastomosis identified on yesterday's study has increased in attenuation in the interval. This increase in attenuation is concerning for some trace spill of contrast from the anastomosis into the collection. 2. Slight increase in fluid volume in the right paracolic gutter with similar fluid volume around the liver and in the left paracolic gutter. Collections of interloop mesenteric fluid are not substantially changed. 3. Free gas in the peritoneal cavity is not unexpected 4 days out from surgery. 4. Gas in the midline subcutaneous tissues deep to the staple line has increased in the interval. 5. Contrast in the kidneys compatible with residual from yesterday's infused scan. 6. Bibasilar atelectasis with tiny bilateral pleural effusions. 7. Aortic Atherosclerosis (ICD10-I70.0). Findings were Dr. Dahlia Byes at approximately 1105 hours on 09/28/2022. Electronically Signed   By: Misty Stanley M.D.   On: 09/28/2022 11:22   DG Chest Port 1 View  Result Date: 09/27/2022 CLINICAL DATA:  PICC line placement. EXAM: PORTABLE CHEST 1 VIEW COMPARISON:  Abdominal series 09/26/2022. CT abdomen and pelvis 09/27/2022 FINDINGS: Shallow inspiration with atelectasis in the lung bases. Cardiac enlargement. No airspace disease or consolidation in the lungs. A right PICC line has been placed with tip over the cavoatrial junction. No pneumothorax. Pneumoperitoneum is demonstrated below the right hemidiaphragm as seen on  prior studies, likely postoperative. IMPRESSION: 1. Right PICC line appears in satisfactory position. 2. Shallow inspiration with atelectasis in the lung bases. Cardiac enlargement.  3. Pneumoperitoneum as seen on prior studies, probably postoperative. Electronically Signed   By: Lucienne Capers M.D.   On: 09/27/2022 19:25   [place] EKG SITE RITE  Result Date: 09/27/2022 If Site Rite image not attached, placement could not be confirmed due to current cardiac rhythm.  CT ABDOMEN PELVIS W CONTRAST  Result Date: 09/27/2022 CLINICAL DATA:  66 year old male with RIGHT colectomy for adenocarcinoma and cholecystectomy performed on 09/24/2022. Hypotension and abdominal discomfort. EXAM: CT ABDOMEN AND PELVIS WITH CONTRAST TECHNIQUE: Multidetector CT imaging of the abdomen and pelvis was performed using the standard protocol following bolus administration of intravenous contrast. RADIATION DOSE REDUCTION: This exam was performed according to the departmental dose-optimization program which includes automated exposure control, adjustment of the mA and/or kV according to patient size and/or use of iterative reconstruction technique. CONTRAST:  135m OMNIPAQUE IOHEXOL 350 MG/ML SOLN COMPARISON:  09/17/2022 CT FINDINGS: Lower chest: Trace bilateral pleural effusions and bibasilar atelectasis noted. Hepatobiliary: The liver is unremarkable. The patient is status post cholecystectomy. There is no evidence of intrahepatic or extrahepatic biliary dilatation. Pancreas: A small amount of ill-defined fluid anterior to the pancreas is noted but the remainder of the pancreas is unremarkable. Spleen: Unremarkable Adrenals/Urinary Tract: No significant abnormalities of the kidneys, adrenal glands or bladder noted. No evidence of hydronephrosis. Stomach/Bowel: RIGHT colectomy and ileo colonic anastomosis noted. There is a small amount of ill-defined high density fluid along the suture line (images 35-39: Series 4), which may represent  a small amount of extravasated contrast/leak. There is a small amount of ascites within the abdomen and pelvis with small to moderate amount of pneumoperitoneum within the abdomen. There is no evidence of focal collection/defined abscess. There is no evidence of bowel obstruction. No definite bowel wall thickening is identified. Vascular/Lymphatic: Aortic atherosclerosis. No enlarged abdominal or pelvic lymph nodes. Reproductive: 2 low-density areas within the prostate gland are noted, measuring 1.7 cm on the LEFT and 1 cm on the RIGHT. Other: Ill-defined stranding within the abdomen and pelvis noted likely recent surgical changes. Musculoskeletal: No acute or suspicious bony abnormalities are identified. IMPRESSION: 1. Ill-defined slightly high density fluid along the ileocolic suture line, which may represent a small amount of extravasated contrast/leak. Small to moderate amount of pneumoperitoneum and small amount of ascites within the abdomen and pelvis, not unexpected given surgery but nonspecific. No evidence of focal collection/defined abscess. 2. Trace bilateral pleural effusions and bibasilar atelectasis. 3. 2 low-density areas within the prostate gland, nonspecific but correlate with PSA. 4.  Aortic Atherosclerosis (ICD10-I70.0). Critical Value/emergent results discussed by phone at the time of interpretation on 09/27/2022 at 1:50 pm to provider DIEGO PABON , who verbally acknowledged these results. Electronically Signed   By: JMargarette CanadaM.D.   On: 09/27/2022 13:51   CT Angio Chest Pulmonary Embolism (PE) W or WO Contrast  Result Date: 09/27/2022 CLINICAL DATA:  Shortness of breath. EXAM: CT ANGIOGRAPHY CHEST WITH CONTRAST TECHNIQUE: Multidetector CT imaging of the chest was performed using the standard protocol during bolus administration of intravenous contrast. Multiplanar CT image reconstructions and MIPs were obtained to evaluate the vascular anatomy. RADIATION DOSE REDUCTION: This exam was  performed according to the departmental dose-optimization program which includes automated exposure control, adjustment of the mA and/or kV according to patient size and/or use of iterative reconstruction technique. CONTRAST:  1057mOMNIPAQUE IOHEXOL 350 MG/ML SOLN COMPARISON:  September 17, 2022. FINDINGS: Cardiovascular: Satisfactory opacification of the pulmonary arteries to the segmental level. No evidence of pulmonary embolism. Normal heart  size. No pericardial effusion. Mediastinum/Nodes: No enlarged mediastinal, hilar, or axillary lymph nodes. Thyroid gland, trachea, and esophagus demonstrate no significant findings. Lungs/Pleura: No pneumothorax is noted. Minimal bilateral pleural effusions are noted with adjacent subsegmental atelectasis. Upper Abdomen: Pneumoperitoneum is noted in the visualized portion of upper abdomen consistent with the reported history of recent laparoscopic right colectomy and cholecystectomy. Musculoskeletal: No chest wall abnormality. No acute or significant osseous findings. Review of the MIP images confirms the above findings. IMPRESSION: No definite evidence of pulmonary embolus. Minimal bilateral pleural effusions are noted with adjacent subsegmental atelectasis. Pneumoperitoneum is noted in visualized portion of upper abdomen consistent with reported history of recent laparoscopic right colectomy and cholecystectomy. Electronically Signed   By: Marijo Conception M.D.   On: 09/27/2022 13:38   DG ABD ACUTE 2+V W 1V CHEST  Result Date: 09/26/2022 CLINICAL DATA:  Ileus following open right colectomy on 09/24/2022 EXAM: DG ABDOMEN ACUTE WITH 1 VIEW CHEST COMPARISON:  09/17/2022 FINDINGS: Gaseous distension of the stomach. Air is seen within the colon. No dilated loops of small bowel. Small-moderate volume pneumoperitoneum. Numerous surgical clips within the right hemiabdomen. Midline abdominal skin staples. Heart size within normal limits. Low lung volumes. Trace left pleural  effusion with left basilar atelectasis. No pneumothorax. IMPRESSION: 1. Small-moderate volume pneumoperitoneum, likely related to recent surgery. 2. Gaseous distension of the stomach. No dilated loops of small bowel to suggest obstruction. Electronically Signed   By: Davina Poke D.O.   On: 09/26/2022 10:57   CT Chest W Contrast  Result Date: 09/18/2022 CLINICAL DATA:  Colon cancer diagnosed on 09/08/2022. Elevated CEA. Staging evaluation. EXAM: CT CHEST, ABDOMEN, AND PELVIS WITH CONTRAST TECHNIQUE: Multidetector CT imaging of the chest, abdomen and pelvis was performed following the standard protocol during bolus administration of intravenous contrast. RADIATION DOSE REDUCTION: This exam was performed according to the departmental dose-optimization program which includes automated exposure control, adjustment of the mA and/or kV according to patient size and/or use of iterative reconstruction technique. CONTRAST:  140m OMNIPAQUE IOHEXOL 300 MG/ML  SOLN COMPARISON:  Abdomen and pelvis CT dated 03/07/2020 FINDINGS: CT CHEST FINDINGS Cardiovascular: Minimal coronary artery calcification. Normal sized heart. Mediastinum/Nodes: No enlarged mediastinal, hilar, or axillary lymph nodes. Thyroid gland, trachea, and esophagus demonstrate no significant findings. Lungs/Pleura: Minimal linear atelectasis or scarring at the left lung base. 5 x 3 mm left lower lobe nodule with a mean diameter of 4 mm on image number 100/5. Taking differences in slice thickness into account, this demonstrates little if any change since 03/07/2020. There is also a small calcified granuloma more superiorly in the left lower lobe on image number 77/5. On image number 97/5 and coronal image number 58/6, there is a 4 x 2 mm oval nodular density with a mean diameter of 3 mm. It is difficult determine if this is a lung nodule or prominent vessel branch point. No other lung nodules seen. No pleural fluid. Musculoskeletal: Thoracic and lower  cervical spine degenerative changes. No evidence of bony metastatic disease. CT ABDOMEN PELVIS FINDINGS Hepatobiliary: Multiple small noncalcified gallstones in the dependent portion of the gallbladder measuring up 5 mm in maximum diameter each. No gallbladder wall thickening or pericholecystic fluid. Unremarkable liver. No liver masses are seen. Pancreas: Unremarkable. No pancreatic ductal dilatation or surrounding inflammatory changes. Spleen: Normal in size without focal abnormality. Adrenals/Urinary Tract: Normal appearing adrenal glands. Small bilateral renal cysts. These do not need imaging follow-up. Poorly distended urinary bladder. No ureteral abnormalities seen. Stomach/Bowel: Interval concentric mass  involving the right colon and ileocecal valve measuring 7.3 cm in length on coronal image number 88/6 with a maximum wall thickness of 1.7 cm on the same image. This mass is confluent with an immediately adjacent mass medially with central low density, measuring 3.8 x 3.0 cm on image number 92/3 and 4.1 cm in length on coronal image number 83/6. There are multiple adjacent lymph node medial to these masses. The largest has a short axis diameter of 2.4 cm on image number 86/3. Multiple descending and sigmoid colon diverticula. Mild wall thickening involving the terminal ileum at the ileocecal valve. The remainder of the small bowel is unremarkable. Normal-appearing appendix and stomach. Vascular/Lymphatic: Mild atheromatous arterial calcifications without aneurysm. Multiple enlarged mesenteric lymph nodes medial to the right colon mass, as described above. No other enlarged lymph nodes seen. Reproductive: Prostate is unremarkable. Other: No abdominal wall hernia or abnormality. No abdominopelvic ascites. Musculoskeletal: Lumbar spine degenerative changes. Mild bilateral hip degenerative changes. No evidence of bony metastatic disease. IMPRESSION: 1. Interval concentric mass involving the right colon, ileocecal  valve and distal terminal ileum, compatible with the patient's known colon carcinoma. No associated obstruction. 2. Adjacent mass with central low density, compatible with local extension of necrotic tumor or an enlarged, necrotic metastatic lymph node. 3. Multiple adjacent enlarged mesenteric lymph nodes medial to these masses, compatible with metastatic adenopathy. 4. Colonic diverticulosis. 5. Cholelithiasis. 6. 4 mm mean diameter left lower lobe nodule possible 3 mm mean diameter right middle lobe nodule. These are felt to have a low likelihood of representing metastatic disease and could be followed on subsequent examinations. Electronically Signed   By: Claudie Revering M.D.   On: 09/18/2022 17:04   CT Abdomen Pelvis W Contrast  Result Date: 09/18/2022 CLINICAL DATA:  Colon cancer diagnosed on 09/08/2022. Elevated CEA. Staging evaluation. EXAM: CT CHEST, ABDOMEN, AND PELVIS WITH CONTRAST TECHNIQUE: Multidetector CT imaging of the chest, abdomen and pelvis was performed following the standard protocol during bolus administration of intravenous contrast. RADIATION DOSE REDUCTION: This exam was performed according to the departmental dose-optimization program which includes automated exposure control, adjustment of the mA and/or kV according to patient size and/or use of iterative reconstruction technique. CONTRAST:  171m OMNIPAQUE IOHEXOL 300 MG/ML  SOLN COMPARISON:  Abdomen and pelvis CT dated 03/07/2020 FINDINGS: CT CHEST FINDINGS Cardiovascular: Minimal coronary artery calcification. Normal sized heart. Mediastinum/Nodes: No enlarged mediastinal, hilar, or axillary lymph nodes. Thyroid gland, trachea, and esophagus demonstrate no significant findings. Lungs/Pleura: Minimal linear atelectasis or scarring at the left lung base. 5 x 3 mm left lower lobe nodule with a mean diameter of 4 mm on image number 100/5. Taking differences in slice thickness into account, this demonstrates little if any change since  03/07/2020. There is also a small calcified granuloma more superiorly in the left lower lobe on image number 77/5. On image number 97/5 and coronal image number 58/6, there is a 4 x 2 mm oval nodular density with a mean diameter of 3 mm. It is difficult determine if this is a lung nodule or prominent vessel branch point. No other lung nodules seen. No pleural fluid. Musculoskeletal: Thoracic and lower cervical spine degenerative changes. No evidence of bony metastatic disease. CT ABDOMEN PELVIS FINDINGS Hepatobiliary: Multiple small noncalcified gallstones in the dependent portion of the gallbladder measuring up 5 mm in maximum diameter each. No gallbladder wall thickening or pericholecystic fluid. Unremarkable liver. No liver masses are seen. Pancreas: Unremarkable. No pancreatic ductal dilatation or surrounding inflammatory changes. Spleen:  Normal in size without focal abnormality. Adrenals/Urinary Tract: Normal appearing adrenal glands. Small bilateral renal cysts. These do not need imaging follow-up. Poorly distended urinary bladder. No ureteral abnormalities seen. Stomach/Bowel: Interval concentric mass involving the right colon and ileocecal valve measuring 7.3 cm in length on coronal image number 88/6 with a maximum wall thickness of 1.7 cm on the same image. This mass is confluent with an immediately adjacent mass medially with central low density, measuring 3.8 x 3.0 cm on image number 92/3 and 4.1 cm in length on coronal image number 83/6. There are multiple adjacent lymph node medial to these masses. The largest has a short axis diameter of 2.4 cm on image number 86/3. Multiple descending and sigmoid colon diverticula. Mild wall thickening involving the terminal ileum at the ileocecal valve. The remainder of the small bowel is unremarkable. Normal-appearing appendix and stomach. Vascular/Lymphatic: Mild atheromatous arterial calcifications without aneurysm. Multiple enlarged mesenteric lymph nodes medial  to the right colon mass, as described above. No other enlarged lymph nodes seen. Reproductive: Prostate is unremarkable. Other: No abdominal wall hernia or abnormality. No abdominopelvic ascites. Musculoskeletal: Lumbar spine degenerative changes. Mild bilateral hip degenerative changes. No evidence of bony metastatic disease. IMPRESSION: 1. Interval concentric mass involving the right colon, ileocecal valve and distal terminal ileum, compatible with the patient's known colon carcinoma. No associated obstruction. 2. Adjacent mass with central low density, compatible with local extension of necrotic tumor or an enlarged, necrotic metastatic lymph node. 3. Multiple adjacent enlarged mesenteric lymph nodes medial to these masses, compatible with metastatic adenopathy. 4. Colonic diverticulosis. 5. Cholelithiasis. 6. 4 mm mean diameter left lower lobe nodule possible 3 mm mean diameter right middle lobe nodule. These are felt to have a low likelihood of representing metastatic disease and could be followed on subsequent examinations. Electronically Signed   By: Claudie Revering M.D.   On: 09/18/2022 17:04    Assessment and plan- Patient is a 66 y.o. male admitted for complications following right hemicolectomy for colon mass.  Pathology shows large cell neuroendocrine carcinoma  Patient's postoperative course has been complicated by hypotension and atrial fibrillation as well as concern for fungal infection for which patient is on IV micafungin.  I have been asked to comment if patient could be in possible carcinoid crisis.  Carcinoid crisis is seen in low-grade well-differentiated neuroendocrine tumors which the patient does not have.  Patient has a large cell neuroendocrine tumor which are high-grade and do not cause carcinoid crisis.  Mildly elevated chromogranin A levels can be seen due to various reasons including the fact that patient is on PPI.  I therefore do not think that octreotide is indicated as  clinically this is not consistent with carcinoid crisis.  In terms of further treatment for his large cell neuro endocrine tumor patient would ideally benefit from adjuvant chemotherapy at some point but that would all depend on his acute hospitalization events as well as posthospital discharge recovery.  In terms of his CBC white cell count seems to be trending down and platelet counts are improving as well.  Suspect anemia secondary to acute hospitalization and recent events.  No indication for any invasive intervention such as bone marrow biopsy.  Continue supportive care   Thank you for this kind referral and the opportunity to participate in the care of this patient   Visit Diagnosis 1. Adenocarcinoma of colon ([place])   2. Type 2 diabetes mellitus without complication, without long-term current use of insulin ([place])   3.  S/P right colectomy     Dr. Randa Evens, MD, MPH [facility] [phone] 10/09/2022

## 2022-10-09 NOTE — Progress Notes (Signed)
OT Cancellation Note  Patient Details Name: Nicolas Zuniga MRN: [id number] DOB: 1956/04/04   Cancelled Treatment:    Reason Eval/Treat Not Completed: Medical issues which prohibited therapy. Chart reviewed - pt noted to have Hgb critically low at 6.0; contraindicated for exertional activity at this time. Will continue to follow POC as pt medically appropriate to participate in therapy.   Dessie Coma, M.S. OTR/L  10/09/22, 9:51 AM  ascom [phone]

## 2022-10-09 NOTE — Progress Notes (Signed)
Physical Therapy Treatment Patient Details Name: Nicolas Zuniga MRN: [id number] DOB: May 31, 1956 Today's Date: 10/09/2022   History of Present Illness 66 y.o. male Post-Op s/p exploratory laparotomy, abdominal washout, and diverting loop ileostomy for intra-abdominal purulent fluid after initial open right colectomy with open cholecystectomy for right colon cancer and cholelithiasis on 10/19, RUQ drain placed recently then intubated. Now extubated 10/31    PT Comments    Pt received in bed, awaiting blood transfusion for HgB of 6.0. Pt issued B LE HEP and participated with assist. Education and visual handout provided to pt and pt's wife with good understanding. Pt noted to have increased fatigue during this visit and slightly warm to touch. Vitals assessed on RA. Pt  found to be tachy with HR in 140's, BP 98/67, temp 97.3, O2 sats 89-94 depending on arousal level. Nursing notified and in to assess. Pt positioned to comfort with pillows to offload, call bell in reach, wife at bedside. Will continue PT per POC as tolerated.    Recommendations for follow up therapy are one component of a multi-disciplinary discharge planning process, led by the attending physician.  Recommendations may be updated based on patient status, additional functional criteria and insurance authorization.  Follow Up Recommendations  Acute inpatient rehab (3hours/day)     Assistance Recommended at Discharge Frequent or constant Supervision/Assistance  Patient can return home with the following A lot of help with walking and/or transfers;A lot of help with bathing/dressing/bathroom;Help with stairs or ramp for entrance;Direct supervision/assist for medications management   Equipment Recommendations  Rolling walker (2 wheels)    Recommendations for Other Services Rehab consult     Precautions / Restrictions Precautions Precautions: Fall Restrictions Weight Bearing Restrictions: No     Mobility  Bed Mobility                General bed mobility comments: Not appropriate this visit    Transfers                        Ambulation/Gait                   Stairs             Wheelchair Mobility    Modified Rankin (Stroke Patients Only)       Balance                                            Cognition Arousal/Alertness: Awake/alert, Lethargic Behavior During Therapy: WFL for tasks assessed/performed, Flat affect Overall Cognitive Status: Within Functional Limits for tasks assessed                                 General Comments: Pt fatigued this date        Exercises General Exercises - Lower Extremity Ankle Circles/Pumps: AROM, Both, 15 reps, Supine Quad Sets: AROM, Strengthening, Both, 10 reps, Supine Gluteal Sets: AROM, Both, 10 reps Other Exercises Other Exercises: B hip IR/ER x 10, B heelcord stretches Other Exercises: Pt given B LE HEP handout with explaination and demonstration provided to pt and wife    General Comments General comments (skin integrity, edema, etc.): Pt lethargic, warm to touch, awaiting blood transfusion, found to be tachy in 140's during session      Pertinent  Vitals/Pain Pain Assessment Pain Assessment: No/denies pain    Home Living                          Prior Function            PT Goals (current goals can now be found in the care plan section) Acute Rehab PT Goals Patient Stated Goal: to get stronger    Frequency    Min 2X/week      PT Plan Current plan remains appropriate    Co-evaluation              AM-PAC PT "6 Clicks" Mobility   Outcome Measure  Help needed turning from your back to your side while in a flat bed without using bedrails?: A Lot Help needed moving from lying on your back to sitting on the side of a flat bed without using bedrails?: A Lot Help needed moving to and from a bed to a chair (including a wheelchair)?: A Lot Help needed  standing up from a chair using your arms (e.g., wheelchair or bedside chair)?: A Lot Help needed to walk in hospital room?: A Lot Help needed climbing 3-5 steps with a railing? : Total 6 Click Score: 11    End of Session   Activity Tolerance: Patient limited by fatigue Patient left: in bed;with call bell/phone within reach;with bed alarm set;with family/visitor present Nurse Communication: Mobility status (Concerns for high HR and low O2 sats at rest) PT Visit Diagnosis: Muscle weakness (generalized) (M62.81);Pain;Difficulty in walking, not elsewhere classified (R26.2)     Time: 1130-1200 PT Time Calculation (min) (ACUTE ONLY): 30 min  Charges:  $Therapeutic Exercise: 8-22 mins $Therapeutic Activity: 8-22 mins                    Mikel Cella, PTA 10/09/2022   Josie Dixon 10/09/2022, 1:57 PM

## 2022-10-09 NOTE — Consult Note (Addendum)
ANTICOAGULATION CONSULT NOTE  Pharmacy Consult for Heparin Infusion Indication: atrial fibrillation  Patient Measurements: Height: '6\' 2"'$  (188 cm) Weight: 126.4 kg (278 lb 10.6 oz) IBW/kg (Calculated) : 82.2 Heparin Dosing Weight: 107.3 kg  Labs: Recent Labs    10/07/22 0510 10/07/22 1801 10/08/22 0554 10/08/22 1811 10/09/22 0613  HGB 8.0*  --  7.3*  --  6.0*  HCT 26.6*  --  24.1*  --  19.1*  PLT 422*  --  437*  --  369  LABPROT  --   --   --   --  18.2*  INR  --   --   --   --  1.5*  HEPARINUNFRC  --  <0.10*  --  <0.10* <0.10*  CREATININE 1.43*  --  1.20  --  0.86     Estimated Creatinine Clearance: 119.4 mL/min (by C-G formula based on SCr of 0.86 mg/dL).   Medical History: Past Medical History:  Diagnosis Date   Adenocarcinoma of colon ([place]) 09/08/2022   Cholelithiasis    Chronic gastritis    Colon polyps    Coronary artery calcification seen on CT scan    Diastolic dysfunction    a.) TTE 04/09/2022: EF >55%, triv TR, G1DD   Diverticulosis    Hypertension    Long term current use of anticoagulant    a.) apixaban   Nephrolithiasis    Paroxysmal A-fib ([place])    a.) CHA2DS2VASc = 3 (age, HTN, T2DM);  b.) s/p DCCV 07/09/2016 (100 J x1), 12/22/2018 (120 J x1), 08/18/2021 (120 J x1), 03/11/2022 (120 J x1), 03/31/2022 (120 J x1); c.) rate/rhythm maintained on oral flecanide + metoprolol succinate; chronically anticoagulated with apixaban   T2DM (type 2 diabetes mellitus) ([place])    Assessment: Nicolas Zuniga is a 66 y.o. male presenting for laparoscopic R colectomy and cholecystectomy. PMH significant for recently diagnosed adenocarcinoma of the colon, DM, HTN, Afib on metoprolol, flecainide, and apixaban. Patient was on [facility] PTA per chart review, last dose of apixaban PTA reported to be on 10/16. Pharmacy has been consulted to initiate and manage heparin infusion. Now s/p procedure.  Heparin was re-started 11/1 and then held given possible hematochezia. No further signs of  bleeding. Worsened anemia 11/2 AM. Per discussion with interdisciplinary team 11/2, will re-challenge heparin again.  Goal of Therapy:  Heparin level 0.3 - 0.5 units/ml (no bolus protocol) Monitor platelets by anticoagulation protocol: Yes   Plan:  --Heparin level < 0.10, also checked aPTT which was 51s. Given high doses of heparin and persistent / worsening anemia, will check aPTT with next heparin level to help guide --No bolus protocol --increase heparin to 2500 units/hr. Required high dose of heparin previously, therefore expect will need to increase rate. Restarting cautiously given previous bleeding at drain site. --Heparin level in 6 hours from rate change --CBC daily; note worsening anemia today requiring blood transfusion  Thank you for allowing pharmacy to be a part of this patient's care.  Benita Gutter 10/09/2022 9:19 AM

## 2022-10-09 NOTE — Consult Note (Signed)
[place] for Electrolyte Monitoring and Replacement   Recent Labs: Potassium (mmol/L)  Date Value  10/09/2022 3.1 (L)  04/05/2014 4.4   Magnesium (mg/dL)  Date Value  10/07/2022 2.0  06/19/2012 1.8   Calcium (mg/dL)  Date Value  10/09/2022 6.8 (L)   Calcium, Total (mg/dL)  Date Value  04/05/2014 9.7   Albumin (g/dL)  Date Value  10/06/2022 2.2 (L)  04/05/2014 4.0   Phosphorus (mg/dL)  Date Value  10/09/2022 1.8 (L)  06/16/2012 2.7   Sodium (mmol/L)  Date Value  10/09/2022 144  04/05/2014 136   Assessment: Patient is a 66 y/o M with medical history including adenocarcinoma of the colon s/p right colectomy & ileocolic anastomoses and cholecystectomy on 10/19 followed by ExLap with abdominal washout, drain placement, and diverting loop ileostomy on 10/23, DM, HTN, Afib s/p multiple ablations. Pharmacy consulted to assist with electrolyte monitoring and replacement as indicated.  Nutrition: NG tube in place on tube feeds and advancing diet per surgery (FLD + nutritional supplements) Diuresis: Intermittent IV Lasix  Medications are being administered per tube as of 11/3  Goal of Therapy:  K > 4  Mg > 2 All other electrolytes within normal limits  Plan:  --K 3.1, Kcl 40 mEq per tube every 4 hours x 2 doses (+ ~40 mEq K+ from Phos-Nak packets) --Phos 1.8, Phos-Nak 2 packets w/ meals x 3 doses --Follow-up electrolytes with AM labs tomorrow  Benita Gutter 10/09/2022 11:15 AM

## 2022-10-09 NOTE — Progress Notes (Signed)
[facility] [place] NAME: Nicolas Zuniga    MR#:  [id number]  DATE OF BIRTH:  [birth year]  SUBJECTIVE:  bedside. Patient's PO intake is minimal. He is not communicating much today. Very weak. Wife encouraging him to use incentive spirometer. Patient went into rapid a fib/tachycardia with soft blood pressure. Hemoglobin down to 6.0. Received IV metoprolol earlier. Overall appears quite sick. Discussed with wife will move him to ICU for close monitoring.  VITALS:  Blood pressure (!) 114/58, pulse 96, temperature 98.2 F (36.8 C), temperature source Oral, resp. rate (!) 40, height '6\' 2"'$  (1.88 m), weight 126.4 kg, SpO2 92 %.  PHYSICAL EXAMINATION:   GENERAL:  66 y.o.-year-old patient lying in the bed with no acute distress. Appears chronically ill LUNGS: decreased breath sounds bilaterally, no wheezing CARDIOVASCULAR: S1, S2 normal. No murmurs,   ABDOMEN: Soft, non-distended, no rebound/guarding. Surgical drain with serosanguinous output. 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 Newly placed drain in RUQ; serous fluid EXTREMITIES: dependent edema b/l.    NEUROLOGIC: nonfocal  patient is alert and awake SKIN: No obvious rash, lesion, or ulcer.   LABORATORY PANEL:  CBC Recent Labs  Lab 10/09/22 0613  WBC 17.6*  HGB 6.0*  HCT 19.1*  PLT 369     Chemistries  Recent Labs  Lab 10/06/22 0110 10/06/22 0457 10/07/22 0510 10/08/22 0554 10/09/22 0613  NA 138   < > 141   < > 144  K 4.9   < > 4.7   < > 3.1*  CL 107   < > 109   < > 116*  CO2 26   < > 28   < > 23  GLUCOSE 156*   < > 191*   < > 96  BUN 35*   < > 34*   < > 22  CREATININE 1.45*   < > 1.43*   < > 0.86  CALCIUM 8.7*   < > 8.8*   < > 6.8*  MG  --    < > 2.0  --   --   AST 20  --   --   --   --   ALT 11  --   --   --   --   ALKPHOS 64  --   --   --   --   BILITOT 0.5  --   --   --   --    < > = values in this interval not displayed.     Cardiac Enzymes No results for input(s): "TROPONINI" in the last 168 hours. RADIOLOGY:  DG Abd Portable 1V  Result Date: 10/08/2022 CLINICAL DATA:  Placement of feeding tube EXAM: PORTABLE ABDOMEN - 1 VIEW COMPARISON:  Previous studies including the examination done earlier today FINDINGS: Tip of feeding tube is seen in the region of distal antrum of the stomach. Bowel gas pattern is nonspecific. Surgical drain is seen in right mid abdomen. IMPRESSION: Tip of feeding tube is seen in the distal antrum of the stomach. Electronically Signed   By: Elmer Picker M.D.   On: 10/08/2022 17:29   DG Abd Portable 1V  Result Date: 10/08/2022 CLINICAL DATA:  [id number] Encounter for feeding tube placement [id number] EXAM: PORTABLE ABDOMEN - 1 VIEW COMPARISON:  Radiograph 11/31/2023 FINDINGS: Feeding tube has been slightly retracted, tip overlying the proximal stomach, just beyond the GE junction. Nonobstructive bowel gas pattern. Field  of view includes the left upper abdomen. IMPRESSION: Feeding tube is been slightly retracted, tip overlying the proximal stomach just beyond the GE junction, would consider advancement. Electronically Signed   By: Maurine Simmering M.D.   On: 10/08/2022 16:06    Assessment and Plan  Nicolas Zuniga is a 66 y.o. male with hx of recently diagnosed adenocarcinoma of the colon who is currently admitted status post right colectomy, diabetes, hypertension, and A-fib status post multiple ablations currently on metoprolol and flecainide.  09/24/2022 - Underwent a laparoscopic right colectomy that was converted to open right colectomy along with a cholecystectomy - no significant complications and EBL was minimal at 80 cc.   10/20: Seen early morning by Dr. Dahlia Byes (general surgery) and was doing well.  Starting around 6 PM he began to have hypotension 70/41 and the hospitalist service was consulted. Pt was given total of 25 g of albumin, 2 L of normal saline, and started on glucagon gtt concern  for beta blocker toxicity. His metoprolol and flecainide were held. BP improved. Mild AKI on labs Cr 1.93 down form admission 1.23, likely hypovolemia. D/c glucagon. Strict I&O. Continue hold beta blocker and flecainide, may reduce dose metoprolol on discharge.  10/21: BP soft but improved 115/53, 117/64. Cr improved 1.66. Hgb stable.  Rapid response overnight, A-fib RVR.  Transferred to stepdown, amiodarone infusion, minimal response, diltiazem infusion.  Dr. Humphrey Rolls, cardiology, aware. Dr Dahlia Byes ordering CT chest/abdomen/pelvis, starting heparin drip. CT no PE, (+) Small bowel leak, certainly no contrast extravasation and seems contained --> plan repeat CT, hold off on surgical intervention unless emergent, starting abx.  10/23: Patient went for repeat CT noting increased extravasation concerning for anastomotic leak.  Patient taken back to the OR and well no leak found, underwent bowel follow-through and washout.  Postop, patient in ICU and still intubated.  ABG notes severe respiratory acidosis.  Note that should patient remain on ventilator, will have critical care assumed medical management in conjunction with surgery. 10/24 for worsening acute renal failure, nephrology consulted.  Patient with bradycardia overnight and Cardizem and Precedex discontinued. 10/25 extubated, off vasopressors, weaned off insulin drip to subcutaneous insulin 10/28.  CT scan showing.  Decreasing intraperitoneal free gas, free fluid on the left liver similar.  Multiple fluid collections in the mesentery. 10/30 interventional radiology performed a IR drainage procedure.  Patient ordered a unit of packed red blood cells on a hemoglobin of 7.2.  I started Lasix for 2 doses. 10/31--remained in ICU was intubated for 24 hours. 11/1--Extubated yday--Dobhoff feeding and ok to cont CLD. No bleeding. OK to start po eliquis (d/w dr Dahlia Byes). Will transfer out of ICU. Resume PT 11/2-- poor PO intake. Continuing G-tube. Resumed IV heparin drip  due to drop in hemoglobin. Holding eliquis. 11/3-- hemoglobin drop down to 6.0. Given one unit blood transfusion ,patient is on IV heparin drip. Blood pressure soft. Went into sinus tach/a fib received IV metoprolol 5 mg times one. Heart rate and 90 to 100. Dr. Clayborn Bigness recommends hold off on IV amiodarone drip. Patient looks unwell/sick. Will move him to step down ICU. Discussed with Dr. Adora Fridge and agreeable. Dr. Janese Banks to see patient. Patient started on lomotil for increased ostomy output by surgery     Assessment and Plan:  Adenocarcinoma of colon (HCC)/large neuroendocrine tumor Right colon cancer status post laparoscopic right colectomy and cholecystectomy on 09/24/2022. Intra-abdominal infection status post exploratory laparotomy with abdominal washout and diverting loop ileostomy on 10/23 10/29 Meropenem started with persistent  leukocytosis and Zosyn discontinued. 10/30 interventional radiology did a CT aspiration of left anterior abdominal fluid collection and placed a drain for the  perihepatic fluid collection. --pt on IV meropenem and Micafungin--per ID -- Dr. Janese Banks to see patient.   Septic shock ([place]) Septic shock has resolved.  Off pressors at this point.     Paroxysmal A-fib (HCC) Currently on amiodarone and metoprolol 11/1--started Eliquis (d/w surgery and [facility] cardiology) --11/2--back on IV heparin gtt  AKI (acute kidney injury) ([place]) Creatinine peaked at 4.61 on 09/30/2022 improved down to 1.43   Generalized anxiety disorder Cont lexapro   Microcytic anemia Surgical team ordered a unit of blood. Hgb 6.0   Weakness Continue working with physical therapy   Acute hypoxic respiratory failure [facility]) Patient required intubation during the hospital course.   Weaned to RA   Hyponatremia Last sodium normal range--resolved   Diabetes ([place]) Type 2 diabetes mellitus on long-acting insulin with being on Tube feeding Last hemoglobin A1c 9.8.   Nutrition Status: Nutrition  Problem: Increased nutrient needs Etiology: post-op healing Signs/Symptoms: estimated needs Interventions: Refer to RD note for recommendations -- patient currently on tube feeding. -- poor PO intake    Procedures:as above Family communication wife at bedside Consults :Gen surgery, ID, PCCM CODE STATUS: FULL DVT Prophylaxis :eliquis Level of care: Stepdown Status is: Inpatient Remains inpatient appropriate because: complex post surgical complications  Patient appears chronically ill    TOTAL TIME TAKING CARE OF THIS PATIENT: 35 minutes.  >50% time spent on counselling and coordination of care  Note: This dictation was prepared with Dragon dictation along with smaller phrase technology. Any transcriptional errors that result from this process are unintentional.  Fritzi Mandes M.D    [facility]   CC: Primary care physician; Hortencia Pilar, MD

## 2022-10-10 DIAGNOSIS — K651 Peritoneal abscess: Secondary | ICD-10-CM | POA: Diagnosis not present

## 2022-10-10 DIAGNOSIS — J9601 Acute respiratory failure with hypoxia: Secondary | ICD-10-CM | POA: Diagnosis not present

## 2022-10-10 DIAGNOSIS — I480 Paroxysmal atrial fibrillation: Secondary | ICD-10-CM | POA: Diagnosis not present

## 2022-10-10 DIAGNOSIS — C189 Malignant neoplasm of colon, unspecified: Secondary | ICD-10-CM | POA: Diagnosis not present

## 2022-10-10 LAB — BASIC METABOLIC PANEL
Anion gap: 5 (ref 5–15)
BUN: 29 mg/dL — ABNORMAL HIGH (ref 8–23)
CO2: 27 mmol/L (ref 22–32)
Calcium: 8.7 mg/dL — ABNORMAL LOW (ref 8.9–10.3)
Chloride: 108 mmol/L (ref 98–111)
Creatinine, Ser: 1.31 mg/dL — ABNORMAL HIGH (ref 0.61–1.24)
GFR, Estimated: >60 mL/min (ref >60)
Glucose, Bld: 102 mg/dL — ABNORMAL HIGH (ref 70–99)
Potassium: 4.7 mmol/L (ref 3.5–5.1)
Sodium: 140 mmol/L (ref 135–145)

## 2022-10-10 LAB — AEROBIC/ANAEROBIC CULTURE W GRAM STAIN (SURGICAL/DEEP WOUND)
Culture: NO GROWTH
Culture: NO GROWTH

## 2022-10-10 LAB — BPAM RBC
Blood Product Expiration Date: 202312072359
ISSUE DATE / TIME: 202311031318
Unit Type and Rh: 5100

## 2022-10-10 LAB — HEPARIN LEVEL (UNFRACTIONATED): Heparin Unfractionated: 0.25 IU/mL — ABNORMAL LOW (ref 0.30–0.70)

## 2022-10-10 LAB — TYPE AND SCREEN
ABO/RH(D): O NEG
Antibody Screen: NEGATIVE
Unit division: 0

## 2022-10-10 LAB — CBC WITH DIFFERENTIAL/PLATELET
Abs Immature Granulocytes: 0.25 10*3/uL — ABNORMAL HIGH (ref 0.00–0.07)
Basophils Absolute: 0.1 10*3/uL (ref 0.0–0.1)
Basophils Relative: 1 %
Eosinophils Absolute: 0.1 10*3/uL (ref 0.0–0.5)
Eosinophils Relative: 0 %
HCT: 28.7 % — ABNORMAL LOW (ref 39.0–52.0)
Hemoglobin: 9 g/dL — ABNORMAL LOW (ref 13.0–17.0)
Immature Granulocytes: 1 %
Lymphocytes Relative: 11 %
Lymphs Abs: 2.1 10*3/uL (ref 0.7–4.0)
MCH: 24.6 pg — ABNORMAL LOW (ref 26.0–34.0)
MCHC: 31.4 g/dL (ref 30.0–36.0)
MCV: 78.4 fL — ABNORMAL LOW (ref 80.0–100.0)
Monocytes Absolute: 2 10*3/uL — ABNORMAL HIGH (ref 0.1–1.0)
Monocytes Relative: 10 %
Neutro Abs: 15.7 10*3/uL — ABNORMAL HIGH (ref 1.7–7.7)
Neutrophils Relative %: 77 %
Platelets: 501 10*3/uL — ABNORMAL HIGH (ref 150–400)
RBC: 3.66 MIL/uL — ABNORMAL LOW (ref 4.22–5.81)
RDW: 17.8 % — ABNORMAL HIGH (ref 11.5–15.5)
WBC: 20.3 10*3/uL — ABNORMAL HIGH (ref 4.0–10.5)
nRBC: 0.1 % (ref 0.0–0.2)

## 2022-10-10 LAB — GLUCOSE, CAPILLARY
Glucose-Capillary: 106 mg/dL — ABNORMAL HIGH (ref 70–99)
Glucose-Capillary: 108 mg/dL — ABNORMAL HIGH (ref 70–99)
Glucose-Capillary: 225 mg/dL — ABNORMAL HIGH (ref 70–99)
Glucose-Capillary: 164 mg/dL — ABNORMAL HIGH (ref 70–99)
Glucose-Capillary: 91 mg/dL (ref 70–99)
Glucose-Capillary: 81 mg/dL (ref 70–99)

## 2022-10-10 LAB — PHOSPHORUS: Phosphorus: 3.9 mg/dL (ref 2.5–4.6)

## 2022-10-10 LAB — MAGNESIUM: Magnesium: 1.7 mg/dL (ref 1.7–2.4)

## 2022-10-10 MED ORDER — ACETAMINOPHEN 500 MG PO TABS
1000.0000 mg | ORAL_TABLET | Freq: Four times a day (QID) | ORAL | Status: DC
  Administered 2022-10-10 – 2022-10-12 (×7): 1000 mg
  Filled 2022-10-10 (×8): qty 2

## 2022-10-10 MED ORDER — MAGNESIUM SULFATE 2 GM/50ML IV SOLN
2.0000 g | Freq: Once | INTRAVENOUS | Status: AC
  Administered 2022-10-10: 2 g via INTRAVENOUS
  Filled 2022-10-10: qty 50

## 2022-10-10 MED ORDER — METOPROLOL TARTRATE 50 MG PO TABS
50.0000 mg | ORAL_TABLET | Freq: Two times a day (BID) | ORAL | Status: DC
  Administered 2022-10-10 – 2022-10-11 (×4): 50 mg
  Filled 2022-10-10 (×4): qty 1

## 2022-10-10 MED ORDER — PROCHLORPERAZINE MALEATE 10 MG PO TABS: 10.0000 mg | ORAL_TABLET | Freq: Four times a day (QID) | ORAL | Status: DC | PRN

## 2022-10-10 MED ORDER — OXYCODONE HCL 5 MG PO TABS: 5.0000 mg | ORAL_TABLET | ORAL | Status: DC | PRN

## 2022-10-10 MED ORDER — HEPARIN (PORCINE) 25000 UT/250ML-% IV SOLN
2500.0000 [IU]/h | INTRAVENOUS | Status: DC
  Administered 2022-10-10 – 2022-10-11 (×4): 2500 [IU]/h via INTRAVENOUS
  Filled 2022-10-10 (×5): qty 250

## 2022-10-10 MED ORDER — DIPHENHYDRAMINE HCL 25 MG PO CAPS
25.0000 mg | ORAL_CAPSULE | Freq: Every day | ORAL | Status: DC
  Administered 2022-10-10 – 2022-10-18 (×9): 25 mg via ORAL
  Filled 2022-10-10 (×9): qty 1

## 2022-10-10 MED ORDER — LOPERAMIDE HCL 1 MG/7.5ML PO SUSP
4.0000 mg | Freq: Two times a day (BID) | ORAL | Status: DC
  Administered 2022-10-10 – 2022-10-11 (×4): 4 mg
  Filled 2022-10-10 (×5): qty 30

## 2022-10-10 MED ORDER — PROCHLORPERAZINE EDISYLATE 10 MG/2ML IJ SOLN: 5.0000 mg | Freq: Four times a day (QID) | INTRAMUSCULAR | Status: DC | PRN

## 2022-10-10 MED ORDER — TRAMADOL HCL 50 MG PO TABS
50.0000 mg | ORAL_TABLET | Freq: Four times a day (QID) | ORAL | Status: DC | PRN
  Filled 2022-10-10: qty 1

## 2022-10-10 MED ORDER — PANTOPRAZOLE SODIUM 40 MG PO TBEC
40.0000 mg | DELAYED_RELEASE_TABLET | Freq: Every day | ORAL | Status: DC
  Administered 2022-10-11 – 2022-10-19 (×9): 40 mg via ORAL
  Filled 2022-10-10 (×9): qty 1

## 2022-10-10 MED ORDER — INSULIN DETEMIR 100 UNIT/ML ~~LOC~~ SOLN
20.0000 [IU] | Freq: Two times a day (BID) | SUBCUTANEOUS | Status: DC
  Administered 2022-10-11: 8 [IU] via SUBCUTANEOUS
  Filled 2022-10-10 (×2): qty 0.2

## 2022-10-10 NOTE — Progress Notes (Signed)
[facility] [place] NAME: Nicolas Zuniga    MR#:  [id number]  DATE OF BIRTH:  1956/02/24  SUBJECTIVE:  wife at bedside patient was moved loss anemia due to rapid a fib/tachycardia. Received 5 mg of IV metoprolol. Patient now in sinus rhythm. Overall slept well. No events overnight. Very alert and fields the best since surgery palpation. Tolerating PO diet. Continued NG feeding. Discussed with surgery resumed IV heparin drip. Hemoglobin stable after one unit blood transfusion. VITALS:  Blood pressure 129/80, pulse 69, temperature 98.7 F (37.1 C), temperature source Axillary, resp. rate (!) 29, height '6\' 2"'$  (1.88 m), weight 122.6 kg, SpO2 93 %.  PHYSICAL EXAMINATION:   GENERAL:  66 y.o.-year-old patient lying in the bed with no acute distress. Appears chronically ill NG+ LUNGS: decreased breath sounds bilaterally, no wheezing CARDIOVASCULAR: S1, S2 normal. No murmurs,   ABDOMEN: Soft, non-distended, no rebound/guarding. Surgical drain with serosanguinous output. 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 Newly placed drain in RUQ; serous fluid EXTREMITIES: dependent edema b/l.   PICC + NEUROLOGIC: nonfocal  patient is alert and awake SKIN: No obvious rash, lesion, or ulcer.   LABORATORY PANEL:  CBC Recent Labs  Lab 10/10/22 0340  WBC 20.3*  HGB 9.0*  HCT 28.7*  PLT 501*     Chemistries  Recent Labs  Lab 10/06/22 0110 10/06/22 0457 10/10/22 0340  NA 138   < > 140  K 4.9   < > 4.7  CL 107   < > 108  CO2 26   < > 27  GLUCOSE 156*   < > 102*  BUN 35*   < > 29*  CREATININE 1.45*   < > 1.31*  CALCIUM 8.7*   < > 8.7*  MG  --    < > 1.7  AST 20  --   --   ALT 11  --   --   ALKPHOS 64  --   --   BILITOT 0.5  --   --    < > = values in this interval not displayed.     RADIOLOGY:  CT ABDOMEN PELVIS WO CONTRAST  Result Date: 10/09/2022 CLINICAL DATA:  Abdomen pain history of colon  mass EXAM: CT ABDOMEN AND PELVIS WITHOUT CONTRAST TECHNIQUE: Multidetector CT imaging of the abdomen and pelvis was performed following the standard protocol without IV contrast. RADIATION DOSE REDUCTION: This exam was performed according to the departmental dose-optimization program which includes automated exposure control, adjustment of the mA and/or kV according to patient size and/or use of iterative reconstruction technique. COMPARISON:  Radiograph 10/08/2022, CT 10/05/2022, 10/03/2022, 09/28/2022, 09/17/2022 FINDINGS: Lower chest: Small bilateral pleural effusions, mildly increased. Partial lower lobe consolidations stable to mildly increased. Hepatobiliary: No focal hepatic abnormality. Status post cholecystectomy. No biliary dilatation. Pancreas: Unremarkable. No pancreatic ductal dilatation or surrounding inflammatory changes. Spleen: Normal in size without focal abnormality. Adrenals/Urinary Tract: Adrenal glands are normal. Kidneys show no hydronephrosis. The bladder is unremarkable Stomach/Bowel: Esophageal tube tip in the stomach. Stomach is nondistended. Fluid-filled small bowel without definitive obstruction. Slightly dense intraluminal fluid probably reflects dilute contrast. Patient is status post right hemicolectomy. There is right lower quadrant loop ileostomy. 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 Vascular/Lymphatic: Mild aortic atherosclerosis. No aneurysm. No suspicious lymph nodes. Reproductive: Prostate is unremarkable. Other: Similar placement of right anterior lower abdominal drainage catheter with tip near  the right abdominal surgical staple line. Interim placement of right lateral perihepatic drainage catheter. Decreased right perihepatic fluid collection compared to prior CT with small gas bubble. Multiple ill-defined left upper quadrant fluid collections are stable to slightly increased, these demonstrate simple fluid density. There are  multiple additional slightly organized appearing fluid collections within the mesentery and bilateral gutters. Left colic gutter fluid collection measures 4.8 by 2.9 cm, previously 6.4 by 3 cm. Right colic gutter fluid collection measures 6.9 by 3.1 cm, previously 7.5 by 3.1 cm. Left anterior long fluid collection measures 13.5 by 1.2 cm, previously 14.7 x 2 cm. There are multiple additional smaller fluid collections throughout the abdomen and pelvis and interspersed amongst the bowel. Decreased small gas and fluid collection in the right anterior abdomen, series 2, image 41. No increasing free air. Generalized soft tissue stranding throughout the mesentery, slightly progressed. Mild subcutaneous edema consistent with anasarca. Musculoskeletal: No acute or suspicious osseous abnormality. IMPRESSION: 1. Status post right hemicolectomy with right lower quadrant loop ileostomy. Similar positioning of right anterior abdominal drainage catheter with tip near surgical staple line in the right mid abdomen. Decreased fluid collection near the surgical sutures. Interim placement of percutaneous right lateral abdominal drainage perihepatic catheter with overall decreased perihepatic fluid collection compared to prior. Redemonstrated multiple slightly organized appearing intra-abdominal fluid collections, several of which have decreased in size compared to the most recent prior CT. Multiple ill-defined left upper quadrant fluid collections which are stable to slightly increased compared to prior. 2. Negative for retroperitoneal hematoma or high density intra-abdominal or intrapelvic fluid collection to suggest acute hemorrhage. 3. Slight increased bilateral pleural effusion and lower lobe consolidations which may reflect atelectasis, pneumonia, or aspiration Critical Value/emergent results were called by telephone at the time of interpretation on 10/09/2022 at 5:18 pm to provider Dr. Dahlia Byes, Who verbally acknowledged these  results. Electronically Signed   By: Donavan Foil M.D.   On: 10/09/2022 17:19   DG Abd Portable 1V  Result Date: 10/08/2022 CLINICAL DATA:  Placement of feeding tube EXAM: PORTABLE ABDOMEN - 1 VIEW COMPARISON:  Previous studies including the examination done earlier today FINDINGS: Tip of feeding tube is seen in the region of distal antrum of the stomach. Bowel gas pattern is nonspecific. Surgical drain is seen in right mid abdomen. IMPRESSION: Tip of feeding tube is seen in the distal antrum of the stomach. Electronically Signed   By: Elmer Picker M.D.   On: 10/08/2022 17:29   DG Abd Portable 1V  Result Date: 10/08/2022 CLINICAL DATA:  [id number] Encounter for feeding tube placement [id number] EXAM: PORTABLE ABDOMEN - 1 VIEW COMPARISON:  Radiograph 11/31/2023 FINDINGS: Feeding tube has been slightly retracted, tip overlying the proximal stomach, just beyond the GE junction. Nonobstructive bowel gas pattern. Field of view includes the left upper abdomen. IMPRESSION: Feeding tube is been slightly retracted, tip overlying the proximal stomach just beyond the GE junction, would consider advancement. Electronically Signed   By: Maurine Simmering M.D.   On: 10/08/2022 16:06    Assessment and Plan  DOMANIQUE LUCKETT is a 66 y.o. male with hx of recently diagnosed adenocarcinoma of the colon who is currently admitted status post right colectomy, diabetes, hypertension, and A-fib status post multiple ablations currently on metoprolol and flecainide.  09/24/2022 - Underwent a laparoscopic right colectomy that was converted to open right colectomy along with a cholecystectomy - no significant complications and EBL was minimal at 80 cc.   10/20: Seen early morning by Dr. Dahlia Byes (  general surgery) and was doing well.  Starting around 6 PM he began to have hypotension 70/41 and the hospitalist service was consulted. Pt was given total of 25 g of albumin, 2 L of normal saline, and started on glucagon gtt concern for beta  blocker toxicity. His metoprolol and flecainide were held. BP improved. Mild AKI on labs Cr 1.93 down form admission 1.23, likely hypovolemia. D/c glucagon. Strict I&O. Continue hold beta blocker and flecainide, may reduce dose metoprolol on discharge.  10/21: BP soft but improved 115/53, 117/64. Cr improved 1.66. Hgb stable.  Rapid response overnight, A-fib RVR.  Transferred to stepdown, amiodarone infusion, minimal response, diltiazem infusion.  Dr. Humphrey Rolls, cardiology, aware. Dr Dahlia Byes ordering CT chest/abdomen/pelvis, starting heparin drip. CT no PE, (+) Small bowel leak, certainly no contrast extravasation and seems contained --> plan repeat CT, hold off on surgical intervention unless emergent, starting abx.  10/23: Patient went for repeat CT noting increased extravasation concerning for anastomotic leak.  Patient taken back to the OR and well no leak found, underwent bowel follow-through and washout.  Postop, patient in ICU and still intubated.  ABG notes severe respiratory acidosis.  Note that should patient remain on ventilator, will have critical care assumed medical management in conjunction with surgery. 10/24 for worsening acute renal failure, nephrology consulted.  Patient with bradycardia overnight and Cardizem and Precedex discontinued. 10/25 extubated, off vasopressors, weaned off insulin drip to subcutaneous insulin 10/28.  CT scan showing.  Decreasing intraperitoneal free gas, free fluid on the left liver similar.  Multiple fluid collections in the mesentery. 10/30 interventional radiology performed a IR drainage procedure.  Patient ordered a unit of packed red blood cells on a hemoglobin of 7.2.  I started Lasix for 2 doses. 10/31--remained in ICU was intubated for 24 hours. 11/1--Extubated yday--Dobhoff feeding and ok to cont CLD. No bleeding. OK to start po eliquis (d/w dr Dahlia Byes). Will transfer out of ICU. Resume PT 11/2-- poor PO intake. Continuing G-tube. Resumed IV heparin drip due to  drop in hemoglobin. Holding eliquis. 11/3-- hemoglobin drop down to 6.0. Given one unit blood transfusion ,patient is on IV heparin drip. Blood pressure soft. Went into sinus tach/a fib received IV metoprolol 5 mg times one. Heart rate and 90 to 100. Dr. Clayborn Bigness recommends hold off on IV amiodarone drip. Patient looks unwell/sick. Will move him to step down ICU. Discussed with Dr. Adora Fridge and agreeable. Dr. Janese Banks to see patient. Patient started on lomotil for increased ostomy output by surgery 11/4-- no overnight events. Patient remains in sinus rhythm. Feels much better. More awake and alert. Tolerating MG feeding. Resumed IV heparin drip after discussing with surgery. Repeat CT abdomen on11/3 shows decreased fluid collections. No retroperitoneal hematoma. Patient remains in sinus rhythm. Resume full liquid diet.     Assessment and Plan:  Adenocarcinoma of colon (HCC)/large neuroendocrine tumor Right colon cancer status post laparoscopic right colectomy and cholecystectomy on 09/24/2022. Intra-abdominal infection status post exploratory laparotomy with abdominal washout and diverting loop ileostomy on 10/23 10/29 Meropenem started with persistent leukocytosis and Zosyn discontinued. 10/30 interventional radiology did a CT aspiration of left anterior abdominal fluid collection and placed a drain for the  perihepatic fluid collection. --pt on IV meropenem and Micafungin--per ID -- Dr. Janese Banks to see patient.   Septic shock ([place]) Septic shock has resolved.  Off pressors at this point.     Paroxysmal A-fib (HCC) Currently on amiodarone and metoprolol 11/1--started Eliquis (d/w surgery and [facility] cardiology) --11/2--back on IV heparin  gtt  AKI (acute kidney injury) ([place]) Creatinine peaked at 4.61 on 09/30/2022 improved down to 1.43   Generalized anxiety disorder Cont lexapro   Microcytic anemia Surgical team ordered a unit of blood. Hgb 6.0   Weakness Continue working with physical therapy    Acute hypoxic respiratory failure [facility]) Patient required intubation during the hospital course.   Weaned to RA   Hyponatremia Last sodium normal range--resolved   Diabetes ([place]) Type 2 diabetes mellitus on long-acting insulin with being on Tube feeding Last hemoglobin A1c 9.8.   Nutrition Status: Nutrition Problem: Increased nutrient needs Etiology: post-op healing Signs/Symptoms: estimated needs Interventions: Refer to RD note for recommendations -- patient currently on tube feeding. -- poor PO intake  Patient has been evaluated by inpatient rehab Dr.Raulkar  Procedures:as above Family communication wife at bedside Consults :Gen surgery, ID, PCCM CODE STATUS: FULL DVT Prophylaxis :eliquis Level of care: Stepdown Status is: Inpatient Remains inpatient appropriate because: complex post surgical complications      TOTAL TIME TAKING CARE OF THIS PATIENT: 35 minutes.  >50% time spent on counselling and coordination of care  Note: This dictation was prepared with Dragon dictation along with smaller phrase technology. Any transcriptional errors that result from this process are unintentional.  Fritzi Mandes M.D    [facility]   CC: Primary care physician; Hortencia Pilar, MD

## 2022-10-10 NOTE — Progress Notes (Signed)
[facility]    SUBJECTIVE: Patient states he feels somewhat better more energy more awake heart rate improved wife still at the bedside denies any pain no fever chills or sweats improved appetite   Vitals:   10/10/22 0900 10/10/22 1000 10/10/22 1100 10/10/22 1200  BP: 127/60 132/67 129/72 129/80  Pulse: 71 69 68 69  Resp: (!) 26 (!) 27 (!) 27 (!) 29  Temp:    98.7 F (37.1 C)  TempSrc:    Axillary  SpO2: (!) 82% 97% 92% 93%  Weight:      Height:         Intake/Output Summary (Last 24 hours) at 10/10/2022 1302 Last data filed at 10/10/2022 1200 Gross per 24 hour  Intake 1084.9 ml  Output 2669 ml  Net -1584.1 ml      PHYSICAL EXAM  General: Well developed, well nourished, in no acute distress HEENT:  Normocephalic and atramatic Neck:  No JVD.  Lungs: Clear bilaterally to auscultation and percussion. Heart: HRRR . Normal S1 and S2 without gallops or murmurs.  Abdomen: Bowel sounds are positive, abdomen soft and non-tender  Msk:  Back normal, normal gait. Normal strength and tone for age. Extremities: No clubbing, cyanosis or edema.   Neuro: Alert and oriented X 3. Psych:  Good affect, responds appropriately   LABS: Basic Metabolic Panel: Recent Labs    10/09/22 0613 10/10/22 0340  NA 144 140  K 3.1* 4.7  CL 116* 108  CO2 23 27  GLUCOSE 96 102*  BUN 22 29*  CREATININE 0.86 1.31*  CALCIUM 6.8* 8.7*  MG  --  1.7  PHOS 1.8* 3.9   Liver Function Tests: No results for input(s): "AST", "ALT", "ALKPHOS", "BILITOT", "PROT", "ALBUMIN" in the last 72 hours. No results for input(s): "LIPASE", "AMYLASE" in the last 72 hours. CBC: Recent Labs    10/09/22 0613 10/09/22 1759 10/10/22 0340  WBC 17.6*  --  20.3*  NEUTROABS 13.8*  --  15.7*  HGB 6.0* 9.1* 9.0*  HCT 19.1* 28.7* 28.7*  MCV 78.3*  --  78.4*  PLT 369  --  501*   Cardiac Enzymes: No results for input(s): "CKTOTAL", "CKMB", "CKMBINDEX", "TROPONINI" in the last 72 hours. BNP: Invalid input(s):  "POCBNP" D-Dimer: No results for input(s): "DDIMER" in the last 72 hours. Hemoglobin A1C: No results for input(s): "HGBA1C" in the last 72 hours. Fasting Lipid Panel: No results for input(s): "CHOL", "HDL", "LDLCALC", "TRIG", "CHOLHDL", "LDLDIRECT" in the last 72 hours. Thyroid Function Tests: No results for input(s): "TSH", "T4TOTAL", "T3FREE", "THYROIDAB" in the last 72 hours.  Invalid input(s): "FREET3" Anemia Panel: No results for input(s): "VITAMINB12", "FOLATE", "FERRITIN", "TIBC", "IRON", "RETICCTPCT" in the last 72 hours.  CT ABDOMEN PELVIS WO CONTRAST  Result Date: 10/09/2022 CLINICAL DATA:  Abdomen pain history of colon mass EXAM: CT ABDOMEN AND PELVIS WITHOUT CONTRAST TECHNIQUE: Multidetector CT imaging of the abdomen and pelvis was performed following the standard protocol without IV contrast. RADIATION DOSE REDUCTION: This exam was performed according to the departmental dose-optimization program which includes automated exposure control, adjustment of the mA and/or kV according to patient size and/or use of iterative reconstruction technique. COMPARISON:  Radiograph 10/08/2022, CT 10/05/2022, 10/03/2022, 09/28/2022, 09/17/2022 FINDINGS: Lower chest: Small bilateral pleural effusions, mildly increased. Partial lower lobe consolidations stable to mildly increased. Hepatobiliary: No focal hepatic abnormality. Status post cholecystectomy. No biliary dilatation. Pancreas: Unremarkable. No pancreatic ductal dilatation or surrounding inflammatory changes. Spleen: Normal in size without focal abnormality. Adrenals/Urinary Tract: Adrenal glands are  normal. Kidneys show no hydronephrosis. The bladder is unremarkable Stomach/Bowel: Esophageal tube tip in the stomach. Stomach is nondistended. Fluid-filled small bowel without definitive obstruction. Slightly dense intraluminal fluid probably reflects dilute contrast. Patient is status post right hemicolectomy. There is right lower quadrant loop  ileostomy. 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 Vascular/Lymphatic: Mild aortic atherosclerosis. No aneurysm. No suspicious lymph nodes. Reproductive: Prostate is unremarkable. Other: Similar placement of right anterior lower abdominal drainage catheter with tip near the right abdominal surgical staple line. Interim placement of right lateral perihepatic drainage catheter. Decreased right perihepatic fluid collection compared to prior CT with small gas bubble. Multiple ill-defined left upper quadrant fluid collections are stable to slightly increased, these demonstrate simple fluid density. There are multiple additional slightly organized appearing fluid collections within the mesentery and bilateral gutters. Left colic gutter fluid collection measures 4.8 by 2.9 cm, previously 6.4 by 3 cm. Right colic gutter fluid collection measures 6.9 by 3.1 cm, previously 7.5 by 3.1 cm. Left anterior long fluid collection measures 13.5 by 1.2 cm, previously 14.7 x 2 cm. There are multiple additional smaller fluid collections throughout the abdomen and pelvis and interspersed amongst the bowel. Decreased small gas and fluid collection in the right anterior abdomen, series 2, image 41. No increasing free air. Generalized soft tissue stranding throughout the mesentery, slightly progressed. Mild subcutaneous edema consistent with anasarca. Musculoskeletal: No acute or suspicious osseous abnormality. IMPRESSION: 1. Status post right hemicolectomy with right lower quadrant loop ileostomy. Similar positioning of right anterior abdominal drainage catheter with tip near surgical staple line in the right mid abdomen. Decreased fluid collection near the surgical sutures. Interim placement of percutaneous right lateral abdominal drainage perihepatic catheter with overall decreased perihepatic fluid collection compared to prior. Redemonstrated multiple slightly organized appearing  intra-abdominal fluid collections, several of which have decreased in size compared to the most recent prior CT. Multiple ill-defined left upper quadrant fluid collections which are stable to slightly increased compared to prior. 2. Negative for retroperitoneal hematoma or high density intra-abdominal or intrapelvic fluid collection to suggest acute hemorrhage. 3. Slight increased bilateral pleural effusion and lower lobe consolidations which may reflect atelectasis, pneumonia, or aspiration Critical Value/emergent results were called by telephone at the time of interpretation on 10/09/2022 at 5:18 pm to provider Dr. Dahlia Byes, Who verbally acknowledged these results. Electronically Signed   By: Donavan Foil M.D.   On: 10/09/2022 17:19   DG Abd Portable 1V  Result Date: 10/08/2022 CLINICAL DATA:  Placement of feeding tube EXAM: PORTABLE ABDOMEN - 1 VIEW COMPARISON:  Previous studies including the examination done earlier today FINDINGS: Tip of feeding tube is seen in the region of distal antrum of the stomach. Bowel gas pattern is nonspecific. Surgical drain is seen in right mid abdomen. IMPRESSION: Tip of feeding tube is seen in the distal antrum of the stomach. Electronically Signed   By: Elmer Picker M.D.   On: 10/08/2022 17:29   DG Abd Portable 1V  Result Date: 10/08/2022 CLINICAL DATA:  [id number] Encounter for feeding tube placement [id number] EXAM: PORTABLE ABDOMEN - 1 VIEW COMPARISON:  Radiograph 11/31/2023 FINDINGS: Feeding tube has been slightly retracted, tip overlying the proximal stomach, just beyond the GE junction. Nonobstructive bowel gas pattern. Field of view includes the left upper abdomen. IMPRESSION: Feeding tube is been slightly retracted, tip overlying the proximal stomach just beyond the GE junction, would consider advancement. Electronically Signed   By: Maurine Simmering M.D.   On: 10/08/2022 16:06  TELEMETRY: Sinus rhythm rate of 75 nonspecific ST-T changes:  ASSESSMENT AND  PLAN:  Principal Problem:   Adenocarcinoma of colon ([place] [facility]) Active Problems:   Diabetes ([place])   Essential hypertension   Paroxysmal A-fib ([facility])   S/P right colectomy   S/P partial resection of colon   Hyponatremia   AKI (acute kidney injury) ([place])   Postoperative intra-abdominal abscess   Persistent atrial fibrillation ([facility])   Septic shock ([facility])   Diabetic ketoacidosis without coma associated with type 2 diabetes mellitus ([facility])   Generalized anxiety disorder   Acute hypoxic respiratory failure ([facility])   Weakness   Microcytic anemia   Intra-abdominal abscess ([facility])    Plan Adenocarcinoma of the colon status post resection postop complication with atrial fibrillation hypertension sepsis continue aggressive therapy medically patient improving today Atrial fibrillation much improved back in sinus rhythm continue oral amiodarone as well as metoprolol Hypertension stable with relative hypotension but somewhat improved today continue current therapy Generalized weakness recommend physical therapy to help with activity and energy and strength Continue diabetes management and control Elevated white count possibly related to sepsis infection continue to follow consider ID input continue broad-spectrum antibiotic therapy Patient appears to be improving can probably transferred from ICU back to the floor   Yolonda Kida, MD 10/10/2022 1:02 PM

## 2022-10-10 NOTE — Progress Notes (Addendum)
NAME:  Nicolas Zuniga, MRN:  [id number], DOB:  08/25/1956, LOS: 14 ADMISSION DATE:  09/24/2022, CONSULTATION DATE: 10/09/22 REFERRING MD: Caroleen Hamman  REASON FOR RE-CONSULTATION:   low BP, afib with RVR, low  HGB    HPI  This is a 66 yo male with right colon cancer and cholelithiasis who presented to [facility] on 10/19 for laparoscopic right colectomy and cholecystectomy.  Surgical findings were right colon mass with evidence of bulky mesenteric nodal involvement; large right colon mass adhered to the retroperitoneum; mesenteric nodes within terminal ileum; tension free anastomosis, no evidence of intraop leak and good perfusion; and chronic calculus cholecystitis.  Postop pt admitted to the medsurg unit per general surgery for additional workup and treatment.   Prolonged hospital course, please see detailed hospital course under significant events. Cardiology, PCCM, General Surgery, TRH, Infectious Disease, Psychiatry & Nephrology all involved in care.  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  Hospitalist team consulted to assist with management all beta blockers and flecainide discontinued  10/22: Pt developed atrial fibrillation with rvr, acute respiratory failure, and hypotension with questionable episode of Vtach, rapid response initiated.  Pt required amiodarone bolus followed by drip and transfer to the stepdown unit.  Rate remained uncontrolled and cardizem gtt added _0  mg/hr in  addition to amiodarone gtt.  Cardiology consulted   10/22: CT Abd Pelvis concerning for possible small leak but no contrast extravasation.  General Surgery recommended- antibiotic therapy; repeat scan in 24-48hrs; and keep NPO except meds  10/23: revealed no evidence of gross enteric contrast extravasation but increased fluid collection adjacent to the anastomosis and slight increase in fluid volume in the right paracolic gutter.  Pt taken back to the OR for exploratory laparotomy with findings of no obvious anastomotic leak or evidence of large or small bowel injuries but intra-abdominal purulent fluid with significant inflammatory response present. Abdominal washout with placement of JP drain and diverting loop colostomy. Pt remained mechanically intubated.  PCCM consulted plans for SBT and possible extubation  10/24: Pt with worsening acute renal failure Nephrology consulted.  Pt with bradycardia overnight cardizem and precedex gtts discontinued.  Pt remains on amiodarone gtt at 30 mg/hr  Pt alert and following commands plans for possible SBT today ~ EXTUBATED 10/25: Tolerating extubation from respiratory standpoint. Off Vasopressors. Slight worsening of AKI, starting to make urine.  DKA resolved, weaning off insulin gtt to SQ insulin.  Atrial fibrillation rate controlled on Amiodarone.  10/26: transfer to [facility] service, NG tube was clamped and output is very minimal, general surgery recommended to start clear liquid diet, NGT removed. No significant overnight events 10/27: Clinically continues to improve, creat trending down, better HD. WBC up bu no fevers and drain is serous and midline wound is healing well. Probably time for diuresis. If wbc continue to be up or if he spikes a fever or has any set back will need repeat CT A/P w po contrast only. 10/28:  No significant events overnight.  In the morning time patient was complaining of some abdominal discomfort, feeling better  now.  WBC count 35.5  significant elevated, discussed with general surgery, CT a/p shows free gas, multiple fluid collections without definitive abscess, ESR 89, Pro-Cal 0.88, CRP 23.3. Tolerating diet well for now.  Denied any chest pain or palpitations. 10/29: Psych consulted for anxiety. Fluid culture from JP drain and meropenem started due to persistent leukocytosis. Sputum culture- serratia marcescens- resistant to cefazolin only s/p zosyn, on meropenem. 10/30: Overnight bright red blood reported per ileostomy, heparin held. Hgb stable. IR consulted for persistent leukocytosis and management of intra-abdominal fluid collections with plan for aspiration and possible drain placement. 2 fluid collections aspirated and 3rd fluid collection underwent drain placement. Post procedure patient transferred to ICU urgently due to worsening mental status, hypercapnia and increased WOB. Patient required emergent intubation and mechanical ventilatory support upon arrival. 10/31: Patient passed SBT and extubated to [facility] 11/01: Transferred to [facility] service on RA. Remains on Meropenem and Micafungin per ID 11/02: iv Heparin resumed, Eliquis held. No significant events 11/03:Patient went into rapid a fib/tachycardia with soft blood pressure. Hemoglobin down to 6.0. Received IV metoprolol. PCCM re-consulted due to high risk for re-intubation and decompensation 11/04: No significant events overnight  Consults:  Cardiology PCCM ID  Significant Diagnostic Tests:  10/22: CT Abd Pelvis Ill-defined slightly high density fluid along the ileocolic suture line, which may represent a small amount of extravasated contrast/leak. Small to moderate amount of pneumoperitoneum and small amount of ascites within the abdomen and pelvis, not unexpected given surgery but  nonspecific. No evidence of focal collection/defined abscess. Trace bilateral pleural effusions and bibasilar atelectasis.   10/22: CTA Chest no definite evidence of pulmonary embolus.  Minimal bilateral pleural effusions are noted with adjacent subsegmental atelectasis. Pneumoperitoneum is noted in visualized portion of upper abdomen consistent with reported history of recent laparoscopic right colectomy and cholecystectomy.  10/23: CT Abd Pelvis while there is no evidence for gross enteric contrast extravasation at the level of the ileocolic anastomosis, the small fluid collection adjacent to the anastomosis identified on yesterday's study has increased in attenuation in the interval. This increase in attenuation is concerning for some trace spill of contrast from the anastomosis into the collection. Slight increase in fluid volume in the right paracolic gutter with similar fluid volume around the liver and in the left paracolic gutter. Collections of interloop mesenteric fluid are not substantially changed. Free gas in the peritoneal cavity is not unexpected 4 days out from surgery. Gas in the midline subcutaneous tissues deep to the staple line has increased in the interval. Contrast in the kidneys compatible with residual from yesterday's infused scan. Bibasilar atelectasis with tiny bilateral pleural effusions.   10/28 CT A/P: Decreased free gas, multiple fluid collections no definitive abscess due to lack of IV contrast.  Small bilateral pleural effusions.   10/30 IR CT aspiration of left anterior abdominal fluid collection, aspiration of left posterolateral abdominal fluid collection and catheter drainage of perihepatic fluid collection: Left anterior fluid collection: clear, yellow fluid. Sample sent for culture. 90 mL aspirated via 18 G trocar needle resulting in complete decompression. Left posterolateral fluid collection: slightly turbid, darker yellow fluid. Sample sent for culture. 50 mL aspirated resulting in near-complete decompression. Perihepatic fluid collection: clear, yellow fluid. 10 Fr drain placed in perihepatic fluid collection given its larger size. Drain attached  to suction bulb drainage.  Interim History / Subjective::  - hemoglobin stable at 9.0  Micro Data:  10/30:Abdominal fluid> pending 10/29: JP Drain>FEW CANDIDA ALBICANS  RARE LACTOBACILLUS SPECIES  10/22:  MRSA PCR>> NOT DETECTED 10/24; Respiratory culture>MODERATE SERRATIA MARCESCENS  FEW CANDIDA ALBICAN   Current Antimicrobials:  Meropenem 10/29>> Micafungin 10/30>>  OBJECTIVE  Blood pressure 132/71, pulse 84, temperature 99.4 F (37.4 C), temperature source Axillary, resp. rate (!) 32, height _0  (1.88 m), weight 126.4 kg, SpO2 95 %.        Intake/Output Summary (Last 24 hours) at 10/10/2022 0136 Last data filed at 10/09/2022 2330 Gross per 24 hour  Intake 1481.84 ml  Output 3154 ml  Net -1672.16 ml   Filed Weights   10/07/22 0433 10/08/22 0453 10/09/22 0500  Weight: 126.5 kg 126 kg 126.4 kg   Physical Examination  GENERAL:  66 y.o.-year-old patient lying in the bed with no acute distress. Appears chronically ill LUNGS: decreased breath sounds bilaterally, no wheezing CARDIOVASCULAR: S1, S2 normal. No murmurs,   ABDOMEN: Soft, non-distended, no rebound/guarding. Surgical drain with serosanguinous output. 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 Newly placed drain in RUQ; serous fluid EXTREMITIES: dependent edema b/l.    NEUROLOGIC: nonfocal  patient is alert and awake SKIN: No obvious rash, lesion, or ulcer.  Labs/imaging that I havepersonally reviewed  (right click and "Reselect all SmartList Selections" daily)     Labs   CBC: Recent Labs  Lab 10/05/22 0440 10/05/22 1842 10/06/22 0013 10/06/22 0457 10/06/22 1135 10/07/22 0510 10/08/22 0554 10/09/22 0613 10/09/22 1759  WBC 31.9* 29.4*  --  31.1*  --  19.8* 20.6* 17.6*  --   NEUTROABS 24.6*  --   --  22.8*  --  16.3* 16.7* 13.8*  --   HGB 7.2* 7.4*   < > 8.2* 8.0* 8.0* 7.3* 6.0* 9.1*  HCT 23.3* 23.0*   < > 27.3* 25.7* 26.6* 24.1* 19.1* 28.7*  MCV 77.9*  79.0*  --  81.0  --  79.4* 80.3 78.3*  --   PLT 405* 402*  --  499*  --  422* 437* 369  --    < > = values in this interval not displayed.    Basic Metabolic Panel: Recent Labs  Lab 10/04/22 0538 10/05/22 0440 10/05/22 1842 10/05/22 2045 10/05/22 2207 10/06/22 0110 10/06/22 0457 10/07/22 0510 10/08/22 0554 10/09/22 0613  NA 134* 136   < >  --    < > 138 139 141 142 144  K 4.1 4.4   < >  --    < > 4.9 4.8 4.7 4.0 3.1*  CL 102 106   < >  --    < > 107 107 109 110 116*  CO2 23 25   < >  --    < > _1 GLUCOSE 313* 248*   < >  --    < > 156* 71 191* 142* 96  BUN 33* 31*   < >  --    < > 35* 35* 34* 29* 22  CREATININE 1.67* 1.32*   < >  --    < > 1.45* 1.52* 1.43* 1.20 0.86  CALCIUM 8.4* 8.4*   < >  --    < > 8.7* 8.7* 8.8* 8.5* 6.8*  MG 1.7 1.8  --  1.7  --   --  1.8 2.0  --   --   PHOS 3.1 3.1  --  3.8  --   --  4.0 3.2 2.3* 1.8*   < > = values in this interval not displayed.   GFR:  Estimated Creatinine Clearance: 119.4 mL/min (by C-G formula based on SCr of 0.86 mg/dL). Recent Labs  Lab 10/03/22 0422 10/03/22 0426 10/05/22 1842 10/06/22 0022 10/06/22 0110 10/06/22 0457 10/07/22 0510 10/08/22 0554 10/09/22 0613  PROCALCITON 0.88  --   --   --   --   --   --   --   --   WBC  --    < > 29.4*  --   --  31.1* 19.8* 20.6* 17.6*  LATICACIDVEN  --   --  1.2 1.3 2.0* 1.4  --   --   --    < > = values in this interval not displayed.    Liver Function Tests: Recent Labs  Lab 10/05/22 1842 10/05/22 2207 10/06/22 0110  AST _0 ALT _1 ALKPHOS 59 63 64  BILITOT 1.1 0.5 0.5  PROT 5.7* 6.2* 6.7  ALBUMIN 1.6* 1.7* 2.2*   No results for input(s): "LIPASE", "AMYLASE" in the last 168 hours. No results for input(s): "AMMONIA" in the last 168 hours.  ABG    Component Value Date/Time   PHART 7.29 (L) 10/05/2022 1920   PCO2ART 57 (H) 10/05/2022 1920   PO2ART 90 10/05/2022 1920   HCO3 27.4 10/05/2022 1920   ACIDBASEDEF 0.3 10/05/2022 1920   O2SAT  98 10/05/2022 1920     Coagulation Profile: Recent Labs  Lab 10/05/22 0440 10/09/22 0613  INR 1.3* 1.5*    Cardiac Enzymes: No results for input(s): "CKTOTAL", "CKMB", "CKMBINDEX", "TROPONINI" in the last 168 hours.  HbA1C: Hemoglobin A1C  Date/Time Value Ref Range Status  06/16/2012 10:17 AM 7.2 (H) 4.2 - 6.3 % Final    Comment:    The American Diabetes Association recommends that a primary goal of therapy should be <7% and that physicians should reevaluate the treatment regimen in patients with HbA1c values consistently >8%.    Hgb A1c MFr Bld  Date/Time Value Ref Range Status  09/24/2022 05:44 PM 9.8 (H) 4.8 - 5.6 % Final    Comment:    (NOTE) Pre diabetes:          5.7%-6.4%  Diabetes:              >6.4%  Glycemic control for   <7.0% adults with diabetes   08/08/2021 01:43 PM 7.4 (H) 4.8 - 5.6 % Final    Comment:    (NOTE) Pre diabetes:          5.7%-6.4%  Diabetes:              >6.4%  Glycemic control for   <7.0% adults with diabetes     CBG: Recent Labs  Lab 10/09/22 1139 10/09/22 1400 10/09/22 1531 10/09/22 1911 10/09/22 2310  GLUCAP 236* 228* 195* 133* 123*    Past Medical History  He,  has a past medical history of Adenocarcinoma of colon ([place]) (09/08/2022), Cholelithiasis, Chronic gastritis, Colon polyps, Coronary artery calcification seen on CT scan, Diastolic dysfunction, Diverticulosis, Hypertension, Long term current use of anticoagulant, Nephrolithiasis, Paroxysmal A-fib ([place]), and T2DM (type 2 diabetes mellitus) ([place]).   Surgical History    Past Surgical History:  Procedure Laterality Date   CARDIOVERSION N/A 12/22/2018   Procedure: CARDIOVERSION (CATH LAB);  Surgeon: Corey Skains, MD;  Location: [facility] ORS;  Service: Cardiovascular;  Laterality: N/A;   CARDIOVERSION N/A 08/18/2021   Procedure: CARDIOVERSION;  Surgeon: Corey Skains, MD;  Location: [facility] ORS;  Service: Cardiovascular;  Laterality: N/A;   CARDIOVERSION  N/A  03/11/2022   Procedure: CARDIOVERSION;  Surgeon: Corey Skains, MD;  Location: [facility] ORS;  Service: Cardiovascular;  Laterality: N/A;   CARDIOVERSION N/A 03/31/2022   Procedure: CARDIOVERSION;  Surgeon: Corey Skains, MD;  Location: [facility] ORS;  Service: Cardiovascular;  Laterality: N/A;   CHOLECYSTECTOMY N/A 09/24/2022   Procedure: LAPAROSCOPIC CHOLECYSTECTOMY;  Surgeon: Jules Husbands, MD;  Location: [facility] ORS;  Service: General;  Laterality: N/A;   COLONOSCOPY WITH PROPOFOL N/A 09/08/2022   Procedure: COLONOSCOPY WITH PROPOFOL;  Surgeon: Lucilla Lame, MD;  Location: [facility] ENDOSCOPY;  Service: Endoscopy;  Laterality: N/A;   ELECTROPHYSIOLOGIC STUDY N/A 07/09/2016   Procedure: CARDIOVERSION;  Surgeon: Dionisio David, MD;  Location: [facility] ORS;  Service: Cardiovascular;  Laterality: N/A;   LAPAROSCOPIC RIGHT COLECTOMY N/A 09/24/2022   Procedure: LAPAROSCOPIC RIGHT COLECTOMY, RNFA to assist;  Surgeon: Jules Husbands, MD;  Location: [facility] ORS;  Service: General;  Laterality: N/A;   LAPAROTOMY N/A 09/28/2022   Procedure: EXPLORATORY LAPAROTOMY WITH OSTOMY CREATION;  Surgeon: Jules Husbands, MD;  Location: [facility] ORS;  Service: General;  Laterality: N/A;   TEE WITHOUT CARDIOVERSION N/A 07/09/2016   Procedure: TRANSESOPHAGEAL ECHOCARDIOGRAM (TEE);  Surgeon: Dionisio David, MD;  Location: [facility] ORS;  Service: Cardiovascular;  Laterality: N/A;   web fingers repaired     as a child     Social History   reports that he has never smoked. He has never been exposed to tobacco smoke. He has never used smokeless tobacco. He reports that he does not drink alcohol and does not use drugs.   Family History   His family history includes COPD in his father; Diabetes in his mother; Heart disease in his mother; Varicose Veins in his mother.   Allergies No Known Allergies   Home Medications  Prior to Admission medications   Medication Sig Start Date End Date Taking? Authorizing Provider  bisacodyl (DULCOLAX) 5  MG EC tablet Take all 4 tablets at 8 am the morning prior to your surgery. 09/14/22  Yes Pabon, Diego F, MD  Cholecalciferol (VITAMIN D) 50 MCG (2000 UT) CAPS Take 2,000 Units by mouth daily.   Yes [provider]  flecainide (TAMBOCOR) 100 MG tablet Take 100 mg by mouth 2 (two) times daily. 03/17/22  Yes [provider]  glipiZIDE (GLUCOTROL XL) 10 MG 24 hr tablet Take 10 mg by mouth 2 (two) times daily.    Yes [provider]  metFORMIN (GLUCOPHAGE-XR) 500 MG 24 hr tablet Take 500 mg by mouth daily with breakfast. 02/10/21  Yes [provider]  metoprolol (LOPRESSOR) 100 MG tablet Take 100 mg by mouth 2 (two) times daily.   Yes [provider]  metroNIDAZOLE (FLAGYL) 500 MG tablet Take 2 tablets at 8AM, take 2 tablets at [facility], and take 2 tablets at 8PM the day prior to your surgery 09/14/22  Yes Pabon, Diego F, MD  Multiple Vitamins-Minerals (MULTIVITAMIN WITH MINERALS) tablet Take 1 tablet by mouth daily with breakfast.    Yes [provider]  neomycin (MYCIFRADIN) 500 MG tablet Take 2 tablet at 8am, take 2 tablets at 2pm, and take 2 tablets at 8pm the day prior to your surgery 09/14/22  Yes Pabon, Diego F, MD  Omega-3 Fatty Acids (FISH OIL) 1000 MG CAPS Take 1,000 mg by mouth daily.   Yes [provider]  OZEMPIC, 1 MG/DOSE, 4 MG/3ML SOPN Inject 1 mg into the skin every Wednesday. Patient stopped taking these medications 08/02/21  Yes [provider]  polyethylene glycol powder (MIRALAX) 17 GM/SCOOP powder Mix full container in 64 ounces of Gatorade or other clear liquid. NO Red 09/14/22  Yes Pabon, [place], MD  Turmeric 400 MG CAPS Take 400 mg by mouth daily.   Yes [provider]  vitamin B-12 (CYANOCOBALAMIN) 100 MCG tablet Take 500 mcg by mouth daily.   Yes [provider]  Vitamin E 400 units TABS Take 400 Units by mouth daily.   Yes [provider]  apixaban (ELIQUIS) 5 MG TABS tablet Take 5 mg by mouth  2 (two) times daily.    [provider]  Coenzyme Q10 (COQ10) 100 MG CAPS Take 100 mg by mouth daily.    [provider]    Scheduled Meds:  acetaminophen  1,000 mg Per Tube Q6H   amiodarone  200 mg Per Tube Daily   Chlorhexidine Gluconate Cloth  6 each Topical Daily   insulin aspart  0-20 Units Subcutaneous Q4H   insulin aspart  8 Units Subcutaneous Q4H   insulin detemir  18 Units Subcutaneous BID   loperamide HCl  4 mg Per Tube BID   metoprolol tartrate  50 mg Per Tube BID   midodrine  2.5 mg Per Tube TID WC   pantoprazole (PROTONIX) IV  40 mg Intravenous Q12H   sodium chloride flush  10-40 mL Intracatheter Q12H   sodium chloride flush  5 mL Intracatheter Q8H   Continuous Infusions:  sodium chloride Stopped (10/06/22 1416)   meropenem (MERREM) IV 1 g (10/09/22 2200)   micafungin (MYCAMINE) 100 mg in sodium chloride 0.9 % 100 mL IVPB 100 mg (10/09/22 2235)   PRN Meds:.dextrose, diphenhydrAMINE, metoprolol tartrate, ondansetron **OR** ondansetron (ZOFRAN) IV, mouth rinse, oxyCODONE, prochlorperazine **OR** prochlorperazine, sodium chloride flush, traMADol   Active Hospital Problem list     Assessment & Plan:  .Acute Hypoxic Hypercapnic Respiratory Failure secondary Aspiration Pneumonia -Supplemental O2 as needed to maintain O2 saturations 88 to 92% -High risk for intubation -Follow intermittent ABG and chest x-ray as needed -Repeat CT Slight increased bilateral pleural effusion and lower lobe consolidations  concerning for atelectasis, pneumonia, OR aspiration  Adenocarcinoma of Right Colon s/p exploratory laparotomy, abdominal washout, and diverting loop ileostomy for intra-abdominal purulent fluid after initial open right colectomy with open cholecystectomy for right colon cancer and cholelithiasis on 10/19  -Repeat CT abd/pelvis no retroperitoneal bleed or hematomas *Continue FLD + nutritional supplementation  -Continue enteric feeding  advancing as  tolerating. Still with poor PO intake -Wound care per General surgery -Oncology following as well, overall poor prognosis  Sepsis in the setting of suspected Aspiration and Intraabdominal Abscesses +Candida albicans in the post colon culture -Monitor fever curve -Trend WBC's & Procalcitonin -Follow cultures as above -Continue Continue meropenem and Micafungin per ID recs -ID input appreciated  Paroxysmal A-fib (HCC) Currently on amiodarone and metoprolol 11/1--holding Eliquis and IV heparin gtt   T2DM HgbA1c 9.8 -CBG's Q4hr; Target range of 140 to 180 -SSI -Follow ICU Hypo/Hyperglycemia protocol  Anemia Multifactorial, Acute blood loss vs Microcyctic S/p PRBCs on 10/30 and 11/3 Hgb stable -Monitor for s&s of bleeding -Transfuse for hgb <7   Best practice:  Diet:  Tube Feed  Pain/Anxiety/Delirium protocol (if indicated): No VAP protocol (if indicated): Not indicated DVT prophylaxis: Contraindicated GI prophylaxis: PPI Glucose control:  SSI Yes Central venous access:  N/A Arterial line:  N/A Foley:  N/A Mobility:  bed rest  PT consulted: N/A Last date of multidisciplinary goals of care discussion [  11/4] Code Status:  full code Disposition: ICU   = Goals of Care = Code Status Order: FULL  Primary Emergency Contact: Riggsbee,[name] Wishes to pursue full aggressive treatment and intervention options, including CPR and intubation, but goals of care will be addressed on going with family if that should become necessary.   Critical care time: 45 minutes       Rufina Falco, DNP, CCRN, FNP-C, AGACNP-BC Acute Care Nurse Practitioner [facility]  PCCM on call pager [phone] until 7 am

## 2022-10-10 NOTE — Consult Note (Signed)
[place] for Electrolyte Monitoring and Replacement   Recent Labs: Potassium (mmol/L)  Date Value  10/10/2022 4.7  04/05/2014 4.4   Magnesium (mg/dL)  Date Value  10/10/2022 1.7  06/19/2012 1.8   Calcium (mg/dL)  Date Value  10/10/2022 8.7 (L)   Calcium, Total (mg/dL)  Date Value  04/05/2014 9.7   Albumin (g/dL)  Date Value  10/06/2022 2.2 (L)  04/05/2014 4.0   Phosphorus (mg/dL)  Date Value  10/10/2022 3.9  06/16/2012 2.7   Sodium (mmol/L)  Date Value  10/10/2022 140  04/05/2014 136   Assessment: Patient is a 66 y/o M with medical history including adenocarcinoma of the colon s/p right colectomy & ileocolic anastomoses and cholecystectomy on 10/19 followed by ExLap with abdominal washout, drain placement, and diverting loop ileostomy on 10/23, DM, HTN, Afib s/p multiple ablations. Pharmacy consulted to assist with electrolyte monitoring and replacement as indicated.  Nutrition: NG tube in place on tube feeds and advancing diet per surgery (FLD + nutritional supplements) Diuresis: Intermittent IV Lasix  Medications are being administered per tube as of 11/3  Goal of Therapy:  K > 4  Mg > 2 All other electrolytes within normal limits  Plan:  - Mag 1.7    Will order Mag sulfate 2 gm IV x 1 --Follow-up electrolytes with AM labs tomorrow  Mitzie Marlar A 10/10/2022 9:06 AM

## 2022-10-10 NOTE — Consult Note (Signed)
ANTICOAGULATION CONSULT NOTE -   Pharmacy Consult for heparin infusion Indication: atrial fibrillation  No Known Allergies  Patient Measurements: Height: '6\' 2"'$  (188 cm) Weight: 122.6 kg (270 lb 4.5 oz) IBW/kg (Calculated) : 82.2 Heparin Dosing Weight: 107.3 kg  Vital Signs: Temp: 98.7 F (37.1 C) (11/04 0200) Temp Source: Axillary (11/04 0200) BP: 114/61 (11/04 0600) Pulse Rate: 68 (11/04 0600)  Labs: Recent Labs    10/08/22 0554 10/08/22 1811 10/09/22 0613 10/09/22 1524 10/09/22 1759 10/10/22 0340  HGB 7.3*  --  6.0*  --  9.1* 9.0*  HCT 24.1*  --  19.1*  --  28.7* 28.7*  PLT 437*  --  369  --   --  501*  APTT  --   --  51*  --   --   --   LABPROT  --   --  18.2*  --   --   --   INR  --   --  1.5*  --   --   --   HEPARINUNFRC  --  <0.10* <0.10* <0.10*  --   --   CREATININE 1.20  --  0.86  --   --  1.31*  TROPONINIHS  --   --   --  24* 27*  --     Estimated Creatinine Clearance: 77.2 mL/min (A) (by C-G formula based on SCr of 1.31 mg/dL (H)).   Medical History: Past Medical History:  Diagnosis Date   Adenocarcinoma of colon ([place]) 09/08/2022   Cholelithiasis    Chronic gastritis    Colon polyps    Coronary artery calcification seen on CT scan    Diastolic dysfunction    a.) TTE 04/09/2022: EF >55%, triv TR, G1DD   Diverticulosis    Hypertension    Long term current use of anticoagulant    a.) apixaban   Nephrolithiasis    Paroxysmal A-fib ([place])    a.) CHA2DS2VASc = 3 (age, HTN, T2DM);  b.) s/p DCCV 07/09/2016 (100 J x1), 12/22/2018 (120 J x1), 08/18/2021 (120 J x1), 03/11/2022 (120 J x1), 03/31/2022 (120 J x1); c.) rate/rhythm maintained on oral flecanide + metoprolol succinate; chronically anticoagulated with apixaban   T2DM (type 2 diabetes mellitus) ([place])     Medications:  Heparin drip at 2500 units/hr stopped 09/08/22 @ 1437  Assessment: Nicolas Zuniga is a 66 y.o. male presenting for laparoscopic R colectomy and cholecystectomy. PMH significant for  recently diagnosed adenocarcinoma of the colon, DM, HTN, Afib on metoprolol, flecainide, and apixaban. Patient was on [facility] P[facility] per chart review, last dose of apixaban PTA reported to be on 10/16. Pharmacy has been consulted to initiate and manage heparin infusion. Now s/p procedure.   11/3 Hgb dropped to 6 and patient received blood transfusion.  Heparin drip stopped.  Pharmacy consulted to resume heparin drip to be restarted today (11/4).  Per MD no bolus.   Goal of Therapy:  Heparin level 0.3-0.5 units/ml Monitor platelets by anticoagulation protocol: Yes   Plan:  No bolus protocol Restart heparin infusion at 2500 units/hr Check anti-Xa level in 6 hours and daily while on heparin Continue to monitor H&H and platelets Daily CBC while on heparin drip  Lorin Picket 10/10/2022,9:06 AM

## 2022-10-10 NOTE — Progress Notes (Signed)
Patient ID: Nicolas Zuniga, male   DOB: 18-May-1956, 66 y.o.   MRN: [id number]     [place] [facility] Day(s): 16.   Interval History: Patient seen and examined, no acute events or new complaints overnight. Patient reports feeling much better this morning.  He endorses that he has been feeling the best he has felt since the day of surgery.  Denies abdominal pain.  Denies any nausea or vomiting.  Vital signs in last 24 hours: [min-max] current  Temp:  [97.9 F (36.6 C)-100.2 F (37.9 C)] 98.7 F (37.1 C) (11/04 0200) Pulse Rate:  [67-136] 68 (11/04 0600) Resp:  [20-41] 23 (11/04 0600) BP: (94-144)/(56-87) 114/61 (11/04 0600) SpO2:  [88 %-97 %] 92 % (11/04 0600) Weight:  [122.6 kg] 122.6 kg (11/04 0300)     Height: '6\' 2"'$  (188 cm) Weight: 122.6 kg BMI (Calculated): 34.69   Physical Exam:  Constitutional: alert, cooperative and no distress  Respiratory: breathing non-labored at rest  Cardiovascular: regular rate and sinus rhythm  Gastrointestinal: soft, non-tender, and non-distended  Labs:     Latest Ref Rng & Units 10/10/2022    3:40 AM 10/09/2022    5:59 PM 10/09/2022    6:13 AM  CBC  WBC 4.0 - 10.5 K/uL 20.3   17.6   Hemoglobin 13.0 - 17.0 g/dL 9.0  9.1  6.0   Hematocrit 39.0 - 52.0 % 28.7  28.7  19.1   Platelets 150 - 400 K/uL 501   369       Latest Ref Rng & Units 10/10/2022    3:40 AM 10/09/2022    6:13 AM 10/08/2022    5:54 AM  CMP  Glucose 70 - 99 mg/dL 102  96  142   BUN 8 - 23 mg/dL '29  22  29   '$ Creatinine 0.61 - 1.24 mg/dL 1.31  0.86  1.20   Sodium 135 - 145 mmol/L 140  144  142   Potassium 3.5 - 5.1 mmol/L 4.7  3.1  4.0   Chloride 98 - 111 mmol/L 108  116  110   CO2 22 - 32 mmol/L '27  23  27   '$ Calcium 8.9 - 10.3 mg/dL 8.7  6.8  8.5     Imaging studies: No new pertinent imaging studies   Assessment/Plan:  66 y.o. male with ascending large thyroid neuroendocrine carcinoma 16 Days Post-Op s/p right hemicolectomy, complicated by pertinent comorbidities  including A-fib, AKI, recurrent episode of unexplained clinical deterioration requiring ICU.  -Patient today's alert, oriented x3, cooperative, no distress -Vital signs are stable.  Heart rate was sinus in the 60s -Patient has no abdominal pain, no abdominal distention. -He is having adequate ostomy output with gas and liquid stool -Drains are serous without sign of enteral leak. -Hemoglobin today 9 and stable after transfusions.  No sign of bleeding. -CT scan done yesterday without any sign of intra-abdominal or retroperitoneal bleeding.  I personally evaluated the images. -No contraindication to restart for liquid diet -No further indication for heparin therapeutic anticoagulation for A-fib -Appreciate hospitalist and PCCM evaluation and management of this clinical medical accelerations. -No contraindication for physical therapy from surgical standpoint   Arnold Long, MD

## 2022-10-10 NOTE — Consult Note (Signed)
ANTICOAGULATION CONSULT NOTE -   Pharmacy Consult for heparin infusion Indication: atrial fibrillation  No Known Allergies  Patient Measurements: Height: '6\' 2"'$  (188 cm) Weight: 122.6 kg (270 lb 4.5 oz) IBW/kg (Calculated) : 82.2 Heparin Dosing Weight: 107.3 kg  Vital Signs: Temp: 98.7 F (37.1 C) (11/04 1200) Temp Source: Axillary (11/04 1200) BP: 124/66 (11/04 1600) Pulse Rate: 65 (11/04 1600)  Labs: Recent Labs    10/08/22 0554 10/08/22 1811 10/09/22 0613 10/09/22 1524 10/09/22 1759 10/10/22 0340 10/10/22 1626  HGB 7.3*  --  6.0*  --  9.1* 9.0*  --   HCT 24.1*  --  19.1*  --  28.7* 28.7*  --   PLT 437*  --  369  --   --  501*  --   APTT  --   --  51*  --   --   --   --   LABPROT  --   --  18.2*  --   --   --   --   INR  --   --  1.5*  --   --   --   --   HEPARINUNFRC  --    < > <0.10* <0.10*  --   --  0.25*  CREATININE 1.20  --  0.86  --   --  1.31*  --   TROPONINIHS  --   --   --  24* 27*  --   --    < > = values in this interval not displayed.     Estimated Creatinine Clearance: 77.2 mL/min (A) (by C-G formula based on SCr of 1.31 mg/dL (H)).   Medical History: Past Medical History:  Diagnosis Date   Adenocarcinoma of colon ([place]) 09/08/2022   Cholelithiasis    Chronic gastritis    Colon polyps    Coronary artery calcification seen on CT scan    Diastolic dysfunction    a.) TTE 04/09/2022: EF >55%, triv TR, G1DD   Diverticulosis    Hypertension    Long term current use of anticoagulant    a.) apixaban   Nephrolithiasis    Paroxysmal A-fib ([place])    a.) CHA2DS2VASc = 3 (age, HTN, T2DM);  b.) s/p DCCV 07/09/2016 (100 J x1), 12/22/2018 (120 J x1), 08/18/2021 (120 J x1), 03/11/2022 (120 J x1), 03/31/2022 (120 J x1); c.) rate/rhythm maintained on oral flecanide + metoprolol succinate; chronically anticoagulated with apixaban   T2DM (type 2 diabetes mellitus) ([place])   Heparin Dosing Weight: 107.3 kg  Medications:  Heparin drip at 2500 units/hr stopped  09/08/22 @ 1437  Assessment: Nicolas Zuniga is a 66 y.o. male presenting for laparoscopic R colectomy and cholecystectomy. PMH significant for recently diagnosed adenocarcinoma of the colon, DM, HTN, Afib on metoprolol, flecainide, and apixaban. Patient was on [facility] PTA per chart review, last dose of apixaban PTA reported to be on 10/16. Pharmacy has been consulted to initiate and manage heparin infusion. Now s/p procedure.   11/3 Hgb dropped to 6 and patient received blood transfusion.  Heparin drip stopped. Pharmacy consulted to resume heparin drip to be restarted today (11/4).  Per MD no bolus.   Date Time aPTT/HL Rate/Comment 11/04 1626 0.25  Subtherapeutic; 2500 > 2600 un/hr     Goal of Therapy:  Heparin level 0.3-0.5 units/ml Monitor platelets by anticoagulation protocol: Yes   Plan:  No bolus protocol Increase heparin infusion to 2600 units/hr (~1 un/k/h) Check anti-Xa level in 6 hours and daily while on heparin Continue  to monitor H&H and platelets Daily CBC while on heparin drip  Lorna Dibble 10/10/2022,6:20 PM

## 2022-10-11 DIAGNOSIS — C189 Malignant neoplasm of colon, unspecified: Secondary | ICD-10-CM | POA: Diagnosis not present

## 2022-10-11 LAB — BASIC METABOLIC PANEL
Anion gap: 5 (ref 5–15)
BUN: 30 mg/dL — ABNORMAL HIGH (ref 8–23)
CO2: 24 mmol/L (ref 22–32)
Calcium: 8.4 mg/dL — ABNORMAL LOW (ref 8.9–10.3)
Chloride: 108 mmol/L (ref 98–111)
Creatinine, Ser: 1.15 mg/dL (ref 0.61–1.24)
GFR, Estimated: >60 mL/min (ref >60)
Glucose, Bld: 94 mg/dL (ref 70–99)
Potassium: 4.6 mmol/L (ref 3.5–5.1)
Sodium: 137 mmol/L (ref 135–145)

## 2022-10-11 LAB — CBC WITH DIFFERENTIAL/PLATELET
Abs Immature Granulocytes: 0.14 10*3/uL — ABNORMAL HIGH (ref 0.00–0.07)
Basophils Absolute: 0.1 10*3/uL (ref 0.0–0.1)
Basophils Relative: 1 %
Eosinophils Absolute: 0.3 10*3/uL (ref 0.0–0.5)
Eosinophils Relative: 1 %
HCT: 26.7 % — ABNORMAL LOW (ref 39.0–52.0)
Hemoglobin: 8.4 g/dL — ABNORMAL LOW (ref 13.0–17.0)
Immature Granulocytes: 1 %
Lymphocytes Relative: 12 %
Lymphs Abs: 2.3 10*3/uL (ref 0.7–4.0)
MCH: 24.8 pg — ABNORMAL LOW (ref 26.0–34.0)
MCHC: 31.5 g/dL (ref 30.0–36.0)
MCV: 78.8 fL — ABNORMAL LOW (ref 80.0–100.0)
Monocytes Absolute: 1.6 10*3/uL — ABNORMAL HIGH (ref 0.1–1.0)
Monocytes Relative: 9 %
Neutro Abs: 14.2 10*3/uL — ABNORMAL HIGH (ref 1.7–7.7)
Neutrophils Relative %: 76 %
Platelets: 459 10*3/uL — ABNORMAL HIGH (ref 150–400)
RBC: 3.39 MIL/uL — ABNORMAL LOW (ref 4.22–5.81)
RDW: 17.9 % — ABNORMAL HIGH (ref 11.5–15.5)
WBC: 18.6 10*3/uL — ABNORMAL HIGH (ref 4.0–10.5)
nRBC: 0.1 % (ref 0.0–0.2)

## 2022-10-11 LAB — GLUCOSE, CAPILLARY
Glucose-Capillary: 85 mg/dL (ref 70–99)
Glucose-Capillary: 97 mg/dL (ref 70–99)
Glucose-Capillary: 149 mg/dL — ABNORMAL HIGH (ref 70–99)
Glucose-Capillary: 148 mg/dL — ABNORMAL HIGH (ref 70–99)
Glucose-Capillary: 226 mg/dL — ABNORMAL HIGH (ref 70–99)
Glucose-Capillary: 179 mg/dL — ABNORMAL HIGH (ref 70–99)

## 2022-10-11 LAB — HEPARIN LEVEL (UNFRACTIONATED)
Heparin Unfractionated: 0.42 IU/mL (ref 0.30–0.70)
Heparin Unfractionated: 0.48 IU/mL (ref 0.30–0.70)

## 2022-10-11 LAB — PHOSPHORUS: Phosphorus: 3.3 mg/dL (ref 2.5–4.6)

## 2022-10-11 LAB — MAGNESIUM: Magnesium: 2 mg/dL (ref 1.7–2.4)

## 2022-10-11 MED ORDER — INSULIN ASPART 100 UNIT/ML IJ SOLN
0.0000 [IU] | Freq: Every day | INTRAMUSCULAR | Status: DC
  Administered 2022-10-11 – 2022-10-17 (×3): 2 [IU] via SUBCUTANEOUS
  Filled 2022-10-11 (×3): qty 1

## 2022-10-11 MED ORDER — INSULIN ASPART 100 UNIT/ML IJ SOLN
0.0000 [IU] | Freq: Three times a day (TID) | INTRAMUSCULAR | Status: DC
  Administered 2022-10-11 – 2022-10-12 (×3): 1 [IU] via SUBCUTANEOUS
  Administered 2022-10-12: 2 [IU] via SUBCUTANEOUS
  Administered 2022-10-12: 1 [IU] via SUBCUTANEOUS
  Administered 2022-10-13 – 2022-10-14 (×4): 2 [IU] via SUBCUTANEOUS
  Administered 2022-10-14 (×2): 3 [IU] via SUBCUTANEOUS
  Administered 2022-10-15 – 2022-10-16 (×4): 2 [IU] via SUBCUTANEOUS
  Administered 2022-10-16: 3 [IU] via SUBCUTANEOUS
  Administered 2022-10-16 – 2022-10-17 (×3): 2 [IU] via SUBCUTANEOUS
  Administered 2022-10-17: 3 [IU] via SUBCUTANEOUS
  Administered 2022-10-18: 2 [IU] via SUBCUTANEOUS
  Administered 2022-10-18 (×2): 3 [IU] via SUBCUTANEOUS
  Administered 2022-10-19 (×2): 2 [IU] via SUBCUTANEOUS
  Administered 2022-10-19: 3 [IU] via SUBCUTANEOUS
  Filled 2022-10-11 (×25): qty 1

## 2022-10-11 MED ORDER — INSULIN DETEMIR 100 UNIT/ML ~~LOC~~ SOLN
8.0000 [IU] | Freq: Every day | SUBCUTANEOUS | Status: DC
  Administered 2022-10-12: 8 [IU] via SUBCUTANEOUS
  Filled 2022-10-11: qty 0.08

## 2022-10-11 NOTE — Consult Note (Signed)
ANTICOAGULATION CONSULT NOTE -   Pharmacy Consult for heparin infusion Indication: atrial fibrillation  No Known Allergies  Patient Measurements: Height: '6\' 2"'$  (188 cm) Weight: 122.6 kg (270 lb 4.5 oz) IBW/kg (Calculated) : 82.2 Heparin Dosing Weight: 107.3 kg  Vital Signs: Temp: 98.2 F (36.8 C) (11/05 0000) Temp Source: Oral (11/05 0000) BP: 124/63 (11/05 0100) Pulse Rate: 69 (11/05 0100)  Labs: Recent Labs    10/08/22 0554 10/08/22 1811 10/09/22 0613 10/09/22 1524 10/09/22 1759 10/10/22 0340 10/10/22 1626 10/11/22 0107  HGB 7.3*  --  6.0*  --  9.1* 9.0*  --   --   HCT 24.1*  --  19.1*  --  28.7* 28.7*  --   --   PLT 437*  --  369  --   --  501*  --   --   APTT  --   --  51*  --   --   --   --   --   LABPROT  --   --  18.2*  --   --   --   --   --   INR  --   --  1.5*  --   --   --   --   --   HEPARINUNFRC  --    < > <0.10* <0.10*  --   --  0.25* 0.42  CREATININE 1.20  --  0.86  --   --  1.31*  --   --   TROPONINIHS  --   --   --  24* 27*  --   --   --    < > = values in this interval not displayed.     Estimated Creatinine Clearance: 77.2 mL/min (A) (by C-G formula based on SCr of 1.31 mg/dL (H)).   Medical History: Past Medical History:  Diagnosis Date   Adenocarcinoma of colon ([place]) 09/08/2022   Cholelithiasis    Chronic gastritis    Colon polyps    Coronary artery calcification seen on CT scan    Diastolic dysfunction    a.) TTE 04/09/2022: EF >55%, triv TR, G1DD   Diverticulosis    Hypertension    Long term current use of anticoagulant    a.) apixaban   Nephrolithiasis    Paroxysmal A-fib ([place])    a.) CHA2DS2VASc = 3 (age, HTN, T2DM);  b.) s/p DCCV 07/09/2016 (100 J x1), 12/22/2018 (120 J x1), 08/18/2021 (120 J x1), 03/11/2022 (120 J x1), 03/31/2022 (120 J x1); c.) rate/rhythm maintained on oral flecanide + metoprolol succinate; chronically anticoagulated with apixaban   T2DM (type 2 diabetes mellitus) ([place])   Heparin Dosing Weight: 107.3  kg  Medications:  Heparin drip at 2500 units/hr stopped 09/08/22 @ 1437  Assessment: Nicolas Zuniga is a 66 y.o. male presenting for laparoscopic R colectomy and cholecystectomy. PMH significant for recently diagnosed adenocarcinoma of the colon, DM, HTN, Afib on metoprolol, flecainide, and apixaban. Patient was on [facility] PTA per chart review, last dose of apixaban PTA reported to be on 10/16. Pharmacy has been consulted to initiate and manage heparin infusion. Now s/p procedure.   11/3 Hgb dropped to 6 and patient received blood transfusion.  Heparin drip stopped. Pharmacy consulted to resume heparin drip to be restarted today (11/4).  Per MD no bolus.   Date Time aPTT/HL Rate/Comment 11/04 1626 0.25  Subtherapeutic; 2500 > 2600 un/hr 11/05 0107 0.42  Therapeutic x 1     Goal of Therapy:  Heparin level 0.3-0.5  units/ml Monitor platelets by anticoagulation protocol: Yes   Plan:  No bolus protocol Continue heparin infusion at 2600 units/hr (~1 un/k/h) Recheck anti-Xa level in 6 hours to confirm Continue to monitor H&H and platelets Daily CBC while on heparin drip  Renda Rolls, PharmD, [facility] 10/11/2022 2:14 AM

## 2022-10-11 NOTE — Progress Notes (Signed)
[facility] Cardiology    SUBJECTIVE: Patient still somewhat fatigued weakness denies any pain still receiving tube feedings awaiting physical therapy hopefully patient can be transitioned to floor.   Vitals:   10/11/22 0900 10/11/22 0937 10/11/22 1000 10/11/22 1100  BP: 136/74 136/74 119/72 100/62  Pulse: 72 74 74 65  Resp: (!) 26  16 (!) 24  Temp:      TempSrc:      SpO2: 95%  92% 92%  Weight:      Height:         Intake/Output Summary (Last 24 hours) at 10/11/2022 1331 Last data filed at 10/11/2022 1000 Gross per 24 hour  Intake 1409.91 ml  Output 2350 ml  Net -940.09 ml      PHYSICAL EXAM  General: Well developed, well nourished, in no acute distress HEENT:  Normocephalic and atramatic Neck:  No JVD.  Lungs: Clear bilaterally to auscultation and percussion. Heart: HRRR . Normal S1 and S2 without gallops or murmurs.  Abdomen: Bowel sounds are positive, abdomen soft and non-tender  Msk:  Back normal, normal gait. Normal strength and tone for age. Extremities: No clubbing, cyanosis or edema.   Neuro: Alert and oriented X 3. Psych:  Good affect, responds appropriately   LABS: Basic Metabolic Panel: Recent Labs    10/10/22 0340 10/11/22 0435  NA 140 137  K 4.7 4.6  CL 108 108  CO2 27 24  GLUCOSE 102* 94  BUN 29* 30*  CREATININE 1.31* 1.15  CALCIUM 8.7* 8.4*  MG 1.7 2.0  PHOS 3.9 3.3   Liver Function Tests: No results for input(s): "AST", "ALT", "ALKPHOS", "BILITOT", "PROT", "ALBUMIN" in the last 72 hours. No results for input(s): "LIPASE", "AMYLASE" in the last 72 hours. CBC: Recent Labs    10/10/22 0340 10/11/22 0435  WBC 20.3* 18.6*  NEUTROABS 15.7* 14.2*  HGB 9.0* 8.4*  HCT 28.7* 26.7*  MCV 78.4* 78.8*  PLT 501* 459*   Cardiac Enzymes: No results for input(s): "CKTOTAL", "CKMB", "CKMBINDEX", "TROPONINI" in the last 72 hours. BNP: Invalid input(s): "POCBNP" D-Dimer: No results for input(s): "DDIMER" in the last 72 hours. Hemoglobin A1C: No  results for input(s): "HGBA1C" in the last 72 hours. Fasting Lipid Panel: No results for input(s): "CHOL", "HDL", "LDLCALC", "TRIG", "CHOLHDL", "LDLDIRECT" in the last 72 hours. Thyroid Function Tests: No results for input(s): "TSH", "T4TOTAL", "T3FREE", "THYROIDAB" in the last 72 hours.  Invalid input(s): "FREET3" Anemia Panel: No results for input(s): "VITAMINB12", "FOLATE", "FERRITIN", "TIBC", "IRON", "RETICCTPCT" in the last 72 hours.  CT ABDOMEN PELVIS WO CONTRAST  Result Date: 10/09/2022 CLINICAL DATA:  Abdomen pain history of colon mass EXAM: CT ABDOMEN AND PELVIS WITHOUT CONTRAST TECHNIQUE: Multidetector CT imaging of the abdomen and pelvis was performed following the standard protocol without IV contrast. RADIATION DOSE REDUCTION: This exam was performed according to the departmental dose-optimization program which includes automated exposure control, adjustment of the mA and/or kV according to patient size and/or use of iterative reconstruction technique. COMPARISON:  Radiograph 10/08/2022, CT 10/05/2022, 10/03/2022, 09/28/2022, 09/17/2022 FINDINGS: Lower chest: Small bilateral pleural effusions, mildly increased. Partial lower lobe consolidations stable to mildly increased. Hepatobiliary: No focal hepatic abnormality. Status post cholecystectomy. No biliary dilatation. Pancreas: Unremarkable. No pancreatic ductal dilatation or surrounding inflammatory changes. Spleen: Normal in size without focal abnormality. Adrenals/Urinary Tract: Adrenal glands are normal. Kidneys show no hydronephrosis. The bladder is unremarkable Stomach/Bowel: Esophageal tube tip in the stomach. Stomach is nondistended. Fluid-filled small bowel without definitive obstruction. Slightly dense intraluminal fluid probably  reflects dilute contrast. Patient is status post right hemicolectomy. There is right lower quadrant loop ileostomy. 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  Vascular/Lymphatic: Mild aortic atherosclerosis. No aneurysm. No suspicious lymph nodes. Reproductive: Prostate is unremarkable. Other: Similar placement of right anterior lower abdominal drainage catheter with tip near the right abdominal surgical staple line. Interim placement of right lateral perihepatic drainage catheter. Decreased right perihepatic fluid collection compared to prior CT with small gas bubble. Multiple ill-defined left upper quadrant fluid collections are stable to slightly increased, these demonstrate simple fluid density. There are multiple additional slightly organized appearing fluid collections within the mesentery and bilateral gutters. Left colic gutter fluid collection measures 4.8 by 2.9 cm, previously 6.4 by 3 cm. Right colic gutter fluid collection measures 6.9 by 3.1 cm, previously 7.5 by 3.1 cm. Left anterior long fluid collection measures 13.5 by 1.2 cm, previously 14.7 x 2 cm. There are multiple additional smaller fluid collections throughout the abdomen and pelvis and interspersed amongst the bowel. Decreased small gas and fluid collection in the right anterior abdomen, series 2, image 41. No increasing free air. Generalized soft tissue stranding throughout the mesentery, slightly progressed. Mild subcutaneous edema consistent with anasarca. Musculoskeletal: No acute or suspicious osseous abnormality. IMPRESSION: 1. Status post right hemicolectomy with right lower quadrant loop ileostomy. Similar positioning of right anterior abdominal drainage catheter with tip near surgical staple line in the right mid abdomen. Decreased fluid collection near the surgical sutures. Interim placement of percutaneous right lateral abdominal drainage perihepatic catheter with overall decreased perihepatic fluid collection compared to prior. Redemonstrated multiple slightly organized appearing intra-abdominal fluid collections, several of which have decreased in size compared to the most recent prior  CT. Multiple ill-defined left upper quadrant fluid collections which are stable to slightly increased compared to prior. 2. Negative for retroperitoneal hematoma or high density intra-abdominal or intrapelvic fluid collection to suggest acute hemorrhage. 3. Slight increased bilateral pleural effusion and lower lobe consolidations which may reflect atelectasis, pneumonia, or aspiration Critical Value/emergent results were called by telephone at the time of interpretation on 10/09/2022 at 5:18 pm to provider Dr. Dahlia Byes, Who verbally acknowledged these results. Electronically Signed   By: Donavan Foil M.D.   On: 10/09/2022 17:19       TELEMETRY: Normal sinus rhythm rate of 70 nonspecific ST-T wave changes:  ASSESSMENT AND PLAN:  Principal Problem:   Adenocarcinoma of colon (HCC) Active Problems:   Diabetes ([place])   Essential hypertension   Paroxysmal A-fib (HCC)   S/P right colectomy   S/P partial resection of colon   Hyponatremia   AKI (acute kidney injury) ([place])   Postoperative intra-abdominal abscess   Persistent atrial fibrillation (HCC)   Septic shock (HCC)   Diabetic ketoacidosis without coma associated with type 2 diabetes mellitus (HCC)   Generalized anxiety disorder   Acute hypoxic respiratory failure (HCC)   Weakness   Microcytic anemia   Intra-abdominal abscess ([place])    Plan Adenocarcinoma of the colon with large neuro endocrine tumor status post resection continue recovery supportive care Paroxysmal defibrillation currently sinus rhythm continue p.o. amiodarone anticoagulation with heparin hopefully can transition to Eliquis Acute on chronic renal insufficiency continue modest hydration Generalized weakness recommend physical therapy Diabetes type 2 uncomplicated continue current tube feedings Septic shock patient currently off of pressors shock appears to been resolved continue supportive care  Yolonda Kida, MD 10/11/2022 1:31 PM

## 2022-10-11 NOTE — Progress Notes (Addendum)
Patient ID: Nicolas Zuniga, male   DOB: [birth year], 67 y.o.   MRN: [id number]     [place] Hospital Day(s): 17.   Interval History: Patient seen and examined, no acute events or new complaints overnight. Patient reports feeling well.  He denies any abdominal pain at this moment.  He denies any nausea or vomiting.  He denies any chest pain or shortness of breath.  He continues to be alert, oriented x3.  Vital signs in last 24 hours: [min-max] current  Temp:  [98.2 F (36.8 C)-99.2 F (37.3 C)] 99.2 F (37.3 C) (11/05 0400) Pulse Rate:  [62-74] 67 (11/05 0800) Resp:  [18-29] 27 (11/05 0800) BP: (116-137)/(57-80) 131/71 (11/05 0800) SpO2:  [92 %-100 %] 92 % (11/05 0755) Weight:  [123.6 kg] 123.6 kg (11/05 0500)     Height: '6\' 2"'$  (188 cm) Weight: 123.6 kg BMI (Calculated): 34.97   Physical Exam:  Constitutional: alert, cooperative and no distress  Respiratory: breathing non-labored at rest  Cardiovascular: regular rate and sinus rhythm  Gastrointestinal: soft, non-tender, and non-distended.  Ileostomy is pink and patent.  Labs:     Latest Ref Rng & Units 10/11/2022    4:35 AM 10/10/2022    3:40 AM 10/09/2022    5:59 PM  CBC  WBC 4.0 - 10.5 K/uL 18.6  20.3    Hemoglobin 13.0 - 17.0 g/dL 8.4  9.0  9.1   Hematocrit 39.0 - 52.0 % 26.7  28.7  28.7   Platelets 150 - 400 K/uL 459  501        Latest Ref Rng & Units 10/11/2022    4:35 AM 10/10/2022    3:40 AM 10/09/2022    6:13 AM  CMP  Glucose 70 - 99 mg/dL 94  102  96   BUN 8 - 23 mg/dL '30  29  22   '$ Creatinine 0.61 - 1.24 mg/dL 1.15  1.31  0.86   Sodium 135 - 145 mmol/L 137  140  144   Potassium 3.5 - 5.1 mmol/L 4.6  4.7  3.1   Chloride 98 - 111 mmol/L 108  108  116   CO2 22 - 32 mmol/L '24  27  23   '$ Calcium 8.9 - 10.3 mg/dL 8.4  8.7  6.8     Imaging studies: No new pertinent imaging studies   Assessment/Plan:  65 y.o. male with ascending colon Neuroendocrine carcinoma 17 Days Post-Op s/p right hemicolectomy,  complicated by pertinent comorbidities including A-fib, AKI, recurrent episode of unexplained clinical deterioration requiring ICU.   -Patient today's alert, oriented x3, cooperative, no distress -Vital signs are stable with no tachycardia.  No fever. -Physical exam without abdominal pain, abdomen exam depressible. -Drainage continues to be serous.  No bilious or enteric output. -Continue with stable hemoglobin, no sign of bleeding. -Patient tolerated full liquid diet.  He would like to continue on full liquids for another day. -Appreciate hospitalist and PCCM evaluation and management of his clinical medical complications. -No contraindication for physical therapy.  Arnold Long, MD

## 2022-10-11 NOTE — Consult Note (Signed)
ANTICOAGULATION CONSULT NOTE -   Pharmacy Consult for heparin infusion Indication: atrial fibrillation  No Known Allergies  Patient Measurements: Height: '6\' 2"'$  (188 cm) Weight: 123.6 kg (272 lb 7.8 oz) IBW/kg (Calculated) : 82.2 Heparin Dosing Weight: 107.3 kg  Vital Signs: Temp: 99.2 F (37.3 C) (11/05 0400) Temp Source: Oral (11/05 0400) BP: 131/71 (11/05 0800) Pulse Rate: 67 (11/05 0800)  Labs: Recent Labs    10/09/22 0613 10/09/22 1524 10/09/22 1759 10/10/22 0340 10/10/22 1626 10/11/22 0107 10/11/22 0435 10/11/22 0819  HGB 6.0*  --  9.1* 9.0*  --   --  8.4*  --   HCT 19.1*  --  28.7* 28.7*  --   --  26.7*  --   PLT 369  --   --  501*  --   --  459*  --   APTT 51*  --   --   --   --   --   --   --   LABPROT 18.2*  --   --   --   --   --   --   --   INR 1.5*  --   --   --   --   --   --   --   HEPARINUNFRC <0.10* <0.10*  --   --  0.25* 0.42  --  0.48  CREATININE 0.86  --   --  1.31*  --   --  1.15  --   TROPONINIHS  --  24* 27*  --   --   --   --   --      Estimated Creatinine Clearance: 88.3 mL/min (by C-G formula based on SCr of 1.15 mg/dL).   Medical History: Past Medical History:  Diagnosis Date   Adenocarcinoma of colon ([place]) 09/08/2022   Cholelithiasis    Chronic gastritis    Colon polyps    Coronary artery calcification seen on CT scan    Diastolic dysfunction    a.) TTE 04/09/2022: EF >55%, triv TR, G1DD   Diverticulosis    Hypertension    Long term current use of anticoagulant    a.) apixaban   Nephrolithiasis    Paroxysmal A-fib ([place])    a.) CHA2DS2VASc = 3 (age, HTN, T2DM);  b.) s/p DCCV 07/09/2016 (100 J x1), 12/22/2018 (120 J x1), 08/18/2021 (120 J x1), 03/11/2022 (120 J x1), 03/31/2022 (120 J x1); c.) rate/rhythm maintained on oral flecanide + metoprolol succinate; chronically anticoagulated with apixaban   T2DM (type 2 diabetes mellitus) ([place])   Heparin Dosing Weight: 107.3 kg  Medications:  Heparin drip at 2500 units/hr stopped  09/08/22 @ 1437  Assessment: Nicolas Zuniga is a 66 y.o. male presenting for laparoscopic R colectomy and cholecystectomy. PMH significant for recently diagnosed adenocarcinoma of the colon, DM, HTN, Afib on metoprolol, flecainide, and apixaban. Patient was on [facility] PTA per chart review, last dose of apixaban PTA reported to be on 10/16. Pharmacy has been consulted to initiate and manage heparin infusion. Now s/p procedure.   11/3 Hgb dropped to 6 and patient received blood transfusion.  Heparin drip stopped. Pharmacy consulted to resume heparin drip to be restarted today (11/4).  Per MD no bolus.   Date Time aPTT/HL Rate/Comment 11/04 1626 0.25  Subtherapeutic; 2500 > 2600 un/hr 11/05 0107 0.42  Therapeutic x 1  11/5 0819 0.48  Therapeutic x2    Goal of Therapy:  Heparin level 0.3-0.5 units/ml Monitor platelets by anticoagulation protocol: Yes   Plan:  No bolus protocol 11/5  0819  HL=0.48 Therapeutic x2   Hgb 8.4 Continue heparin infusion at 2600 units/hr (~1 un/k/h) F/u Heparin level with am labs Continue to monitor H&H and platelets Daily CBC while on heparin drip  Chinita Greenland PharmD Clinical Pharmacist 10/11/2022

## 2022-10-11 NOTE — Progress Notes (Signed)
[place] [facility] [employer] NAME: Nicolas Zuniga    MR#:  [id number]  DATE OF BIRTH:  1955/12/23  SUBJECTIVE:  seen earlier in the day. No family at bedside. Uneventful day yesterday and today so far. Tolerating full liquid. No fever. Vitals stable. Patient communicating well awake and alert.  VITALS:  Blood pressure 100/62, pulse 65, temperature 98 F (36.7 C), temperature source Oral, resp. rate (!) 24, height '6\' 2"'$  (1.88 m), weight 123.6 kg, SpO2 92 %.  PHYSICAL EXAMINATION:   GENERAL:  66 y.o.-year-old patient lying in the bed with no acute distress. Appears chronically ill NG+ LUNGS: decreased breath sounds bilaterally, no wheezing CARDIOVASCULAR: S1, S2 normal. No murmurs,   ABDOMEN: Soft, non-distended, no rebound/guarding. Surgical drain with serosanguinous output. Diverting loop colostomy in right abdomen Newly placed drain in RUQ; serous fluid EXTREMITIES: dependent edema b/l.   PICC + NEUROLOGIC: nonfocal  patient is alert and awake SKIN: No obvious rash, lesion, or ulcer.   LABORATORY PANEL:  CBC Recent Labs  Lab 10/11/22 0435  WBC 18.6*  HGB 8.4*  HCT 26.7*  PLT 459*     Chemistries  Recent Labs  Lab 10/06/22 0110 10/06/22 0457 10/11/22 0435  NA 138   < > 137  K 4.9   < > 4.6  CL 107   < > 108  CO2 26   < > 24  GLUCOSE 156*   < > 94  BUN 35*   < > 30*  CREATININE 1.45*   < > 1.15  CALCIUM 8.7*   < > 8.4*  MG  --    < > 2.0  AST 20  --   --   ALT 11  --   --   ALKPHOS 64  --   --   BILITOT 0.5  --   --    < > = values in this interval not displayed.     RADIOLOGY:  CT ABDOMEN PELVIS WO CONTRAST  Result Date: 10/09/2022 CLINICAL DATA:  Abdomen pain history of colon mass EXAM: CT ABDOMEN AND PELVIS WITHOUT CONTRAST TECHNIQUE: Multidetector CT imaging of the abdomen and pelvis was performed following the standard protocol without IV contrast. RADIATION DOSE REDUCTION: This exam was performed according to the  departmental dose-optimization program which includes automated exposure control, adjustment of the mA and/or kV according to patient size and/or use of iterative reconstruction technique. COMPARISON:  Radiograph 10/08/2022, CT 10/05/2022, 10/03/2022, 09/28/2022, 09/17/2022 FINDINGS: Lower chest: Small bilateral pleural effusions, mildly increased. Partial lower lobe consolidations stable to mildly increased. Hepatobiliary: No focal hepatic abnormality. Status post cholecystectomy. No biliary dilatation. Pancreas: Unremarkable. No pancreatic ductal dilatation or surrounding inflammatory changes. Spleen: Normal in size without focal abnormality. Adrenals/Urinary Tract: Adrenal glands are normal. Kidneys show no hydronephrosis. The bladder is unremarkable Stomach/Bowel: Esophageal tube tip in the stomach. Stomach is nondistended. Fluid-filled small bowel without definitive obstruction. Slightly dense intraluminal fluid probably reflects dilute contrast. Patient is status post right hemicolectomy. There is right lower quadrant loop ileostomy. 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 Vascular/Lymphatic: Mild aortic atherosclerosis. No aneurysm. No suspicious lymph nodes. Reproductive: Prostate is unremarkable. Other: Similar placement of right anterior lower abdominal drainage catheter with tip near the right abdominal surgical staple line. Interim placement of right lateral perihepatic drainage catheter. Decreased right perihepatic fluid collection compared to prior CT with small gas bubble. Multiple ill-defined left upper quadrant fluid collections are stable to slightly increased,  these demonstrate simple fluid density. There are multiple additional slightly organized appearing fluid collections within the mesentery and bilateral gutters. Left colic gutter fluid collection measures 4.8 by 2.9 cm, previously 6.4 by 3 cm. Right colic gutter fluid collection measures 6.9 by 3.1  cm, previously 7.5 by 3.1 cm. Left anterior long fluid collection measures 13.5 by 1.2 cm, previously 14.7 x 2 cm. There are multiple additional smaller fluid collections throughout the abdomen and pelvis and interspersed amongst the bowel. Decreased small gas and fluid collection in the right anterior abdomen, series 2, image 41. No increasing free air. Generalized soft tissue stranding throughout the mesentery, slightly progressed. Mild subcutaneous edema consistent with anasarca. Musculoskeletal: No acute or suspicious osseous abnormality. IMPRESSION: 1. Status post right hemicolectomy with right lower quadrant loop ileostomy. Similar positioning of right anterior abdominal drainage catheter with tip near surgical staple line in the right mid abdomen. Decreased fluid collection near the surgical sutures. Interim placement of percutaneous right lateral abdominal drainage perihepatic catheter with overall decreased perihepatic fluid collection compared to prior. Redemonstrated multiple slightly organized appearing intra-abdominal fluid collections, several of which have decreased in size compared to the most recent prior CT. Multiple ill-defined left upper quadrant fluid collections which are stable to slightly increased compared to prior. 2. Negative for retroperitoneal hematoma or high density intra-abdominal or intrapelvic fluid collection to suggest acute hemorrhage. 3. Slight increased bilateral pleural effusion and lower lobe consolidations which may reflect atelectasis, pneumonia, or aspiration Critical Value/emergent results were called by telephone at the time of interpretation on 10/09/2022 at 5:18 pm to provider Dr. Dahlia Byes, Who verbally acknowledged these results. Electronically Signed   By: Donavan Foil M.D.   On: 10/09/2022 17:19    Assessment and Plan  Nicolas Zuniga is a 66 y.o. male with hx of recently diagnosed adenocarcinoma of the colon who is currently admitted status post right colectomy,  diabetes, hypertension, and A-fib status post multiple ablations currently on metoprolol and flecainide.  09/24/2022 - Underwent a laparoscopic right colectomy that was converted to open right colectomy along with a cholecystectomy - no significant complications and EBL was minimal at 80 cc.   10/20: Seen early morning by Dr. Dahlia Byes (general surgery) and was doing well.  Starting around 6 PM he began to have hypotension 70/41 and the hospitalist service was consulted. Pt was given total of 25 g of albumin, 2 L of normal saline, and started on glucagon gtt concern for beta blocker toxicity. His metoprolol and flecainide were held. BP improved. Mild AKI on labs Cr 1.93 down form admission 1.23, likely hypovolemia. D/c glucagon. Strict I&O. Continue hold beta blocker and flecainide, may reduce dose metoprolol on discharge.  10/21: BP soft but improved 115/53, 117/64. Cr improved 1.66. Hgb stable.  Rapid response overnight, A-fib RVR.  Transferred to stepdown, amiodarone infusion, minimal response, diltiazem infusion.  Dr. Humphrey Rolls, cardiology, aware. Dr Dahlia Byes ordering CT chest/abdomen/pelvis, starting heparin drip. CT no PE, (+) Small bowel leak, certainly no contrast extravasation and seems contained --> plan repeat CT, hold off on surgical intervention unless emergent, starting abx.  10/23: Patient went for repeat CT noting increased extravasation concerning for anastomotic leak.  Patient taken back to the OR and well no leak found, underwent bowel follow-through and washout.  Postop, patient in ICU and still intubated.  ABG notes severe respiratory acidosis.  Note that should patient remain on ventilator, will have critical care assumed medical management in conjunction with surgery. 10/24 for worsening acute renal failure,  nephrology consulted.  Patient with bradycardia overnight and Cardizem and Precedex discontinued. 10/25 extubated, off vasopressors, weaned off insulin drip to subcutaneous insulin 10/28.  CT  scan showing.  Decreasing intraperitoneal free gas, free fluid on the left liver similar.  Multiple fluid collections in the mesentery. 10/30 interventional radiology performed a IR drainage procedure.  Patient ordered a unit of packed red blood cells on a hemoglobin of 7.2.  I started Lasix for 2 doses. 10/31--remained in ICU was intubated for 24 hours. 11/1--Extubated yday--Dobhoff feeding and ok to cont CLD. No bleeding. OK to start po eliquis (d/w dr Dahlia Byes). Will transfer out of ICU. Resume PT 11/2-- poor PO intake. Continuing G-tube. Resumed IV heparin drip due to drop in hemoglobin. Holding eliquis. 11/3-- hemoglobin drop down to 6.0. Given one unit blood transfusion ,patient is on IV heparin drip. Blood pressure soft. Went into sinus tach/a fib received IV metoprolol 5 mg times one. Heart rate and 90 to 100. Dr. Clayborn Bigness recommends hold off on IV amiodarone drip. Patient looks unwell/sick. Will move him to step down ICU. Discussed with Dr. Adora Fridge and agreeable. Dr. Janese Banks to see patient. Patient started on lomotil for increased ostomy output by surgery 11/4-- no overnight events. Patient remains in sinus rhythm. Feels much better. More awake and alert. Tolerating MG feeding. Resumed IV heparin drip after discussing with surgery. Repeat CT abdomen on11/3 shows decreased fluid collections. No retroperitoneal hematoma. Patient remains in sinus rhythm. Resume full liquid diet. 11/5-- remains hemodynamically stable. Tolerating full liquid diet will continue with Dr. today. Okay to resume PT. Orders placed. Vitals remains stable. Labs stable. Will transfer out to PCU     Assessment and Plan:  Adenocarcinoma of colon (HCC)/large neuroendocrine tumor Right colon cancer status post laparoscopic right colectomy and cholecystectomy on 09/24/2022. Intra-abdominal infection status post exploratory laparotomy with abdominal washout and diverting loop ileostomy on 10/23 10/29 Meropenem started with persistent  leukocytosis and Zosyn discontinued. 10/30 interventional radiology did a CT aspiration of left anterior abdominal fluid collection and placed a drain for the  perihepatic fluid collection. --pt on IV meropenem and Micafungin--per ID -- Dr. Janese Banks consutled   Septic shock [facility]) Septic shock has resolved.  Off pressors at this point.     Paroxysmal A-fib ([facility]) Currently on amiodarone and metoprolol 11/1--started Eliquis (d/w surgery and [facility] cardiology) --11/2--back on IV heparin gtt  AKI (acute kidney injury) ([place]) Creatinine peaked at 4.61 on 09/30/2022 improved down to 1.43   Generalized anxiety disorder Cont lexapro   Microcytic anemia Hgb 8.4   Weakness Continue working with physical therapy   Acute hypoxic respiratory failure [facility]) Patient required intubation during the hospital course.   Weaned to RA   Hyponatremia Last sodium normal range--resolved   Diabetes ([place]) Type 2 diabetes mellitus on long-acting insulin with being on Tube feeding Last hemoglobin A1c 9.8.   Nutrition Status: Nutrition Problem: Increased nutrient needs Etiology: post-op healing Signs/Symptoms: estimated needs Interventions: Refer to RD note for recommendations -- patient currently on tube feeding. -- poor PO intake  Patient has been evaluated by inpatient rehab Dr.Raulkar  Procedures:as above Family communication none today Consults :Gen surgery, ID, PCCM CODE STATUS: FULL DVT Prophylaxis :eliquis Level of care: Telemetry Cardiac Status is: Inpatient Remains inpatient appropriate because: complex post surgical complications      TOTAL TIME TAKING CARE OF THIS PATIENT: 35 minutes.  >50% time spent on counselling and coordination of care  Note: This dictation was prepared with Dragon dictation along with smaller  Company secretary. Any transcriptional errors that result from this process are unintentional.  Fritzi Mandes M.D    [facility]   CC: Primary care physician;  Hortencia Pilar, MD

## 2022-10-11 NOTE — Consult Note (Signed)
[place] for Electrolyte Monitoring and Replacement   Recent Labs: Potassium (mmol/L)  Date Value  10/11/2022 4.6  04/05/2014 4.4   Magnesium (mg/dL)  Date Value  10/11/2022 2.0  06/19/2012 1.8   Calcium (mg/dL)  Date Value  10/11/2022 8.4 (L)   Calcium, Total (mg/dL)  Date Value  04/05/2014 9.7   Albumin (g/dL)  Date Value  10/06/2022 2.2 (L)  04/05/2014 4.0   Phosphorus (mg/dL)  Date Value  10/11/2022 3.3  06/16/2012 2.7   Sodium (mmol/L)  Date Value  10/11/2022 137  04/05/2014 136   Assessment: Patient is a 66 y/o M with medical history including adenocarcinoma of the colon s/p right colectomy & ileocolic anastomoses and cholecystectomy on 10/19 followed by ExLap with abdominal washout, drain placement, and diverting loop ileostomy on 10/23, DM, HTN, Afib s/p multiple ablations. Pharmacy consulted to assist with electrolyte monitoring and replacement as indicated.  Nutrition: NG tube in place on tube feeds and advancing diet per surgery (FLD + nutritional supplements) Diuresis: Intermittent IV Lasix  Medications are being administered per tube as of 11/3  Goal of Therapy:  K > 4  Mg > 2 All other electrolytes within normal limits  Plan:  - electrolytes WNL- no replacement at this time --Follow-up electrolytes with AM labs tomorrow  Bush Murdoch A 10/11/2022 8:43 AM

## 2022-10-11 NOTE — Progress Notes (Signed)
NAME:  Nicolas Zuniga, MRN:  [id number], DOB:  October 07, 1956, LOS: 82 ADMISSION DATE:  09/24/2022, CONSULTATION DATE: 10/09/22 REFERRING MD: Caroleen Hamman  REASON FOR RE-CONSULTATION:   low BP, afib with RVR, low  HGB    HPI  This is a 66 yo male with right colon cancer and cholelithiasis who presented to [facility] on 10/19 for laparoscopic right colectomy and cholecystectomy.  Surgical findings were right colon mass with evidence of bulky mesenteric nodal involvement; large right colon mass adhered to the retroperitoneum; mesenteric nodes within terminal ileum; tension free anastomosis, no evidence of intraop leak and good perfusion; and chronic calculus cholecystitis.  Postop pt admitted to the medsurg unit per general surgery for additional workup and treatment.   Prolonged hospital course, please see detailed hospital course under significant events. Cardiology, PCCM, General Surgery, TRH, Infectious Disease, Psychiatry & Nephrology all involved in care.  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  Hospitalist team consulted to assist with management all beta blockers and flecainide discontinued  10/22: Pt developed atrial fibrillation with rvr, acute respiratory failure, and hypotension with questionable episode of Vtach, rapid response initiated.  Pt required amiodarone bolus followed by drip and transfer to the stepdown unit.  Rate remained uncontrolled and cardizem gtt added _0  mg/hr in  addition to amiodarone gtt.  Cardiology consulted   10/22: CT Abd Pelvis concerning for possible small leak but no contrast extravasation.  General Surgery recommended- antibiotic therapy; repeat scan in 24-48hrs; and keep NPO except meds  10/23: revealed no evidence of gross enteric contrast extravasation but increased fluid collection adjacent to the anastomosis and slight increase in fluid volume in the right paracolic gutter.  Pt taken back to the OR for exploratory laparotomy with findings of no obvious anastomotic leak or evidence of large or small bowel injuries but intra-abdominal purulent fluid with significant inflammatory response present. Abdominal washout with placement of JP drain and diverting loop colostomy. Pt remained mechanically intubated.  PCCM consulted plans for SBT and possible extubation  10/24: Pt with worsening acute renal failure Nephrology consulted.  Pt with bradycardia overnight cardizem and precedex gtts discontinued.  Pt remains on amiodarone gtt at 30 mg/hr  Pt alert and following commands plans for possible SBT today ~ EXTUBATED 10/25: Tolerating extubation from respiratory standpoint. Off Vasopressors. Slight worsening of AKI, starting to make urine.  DKA resolved, weaning off insulin gtt to SQ insulin.  Atrial fibrillation rate controlled on Amiodarone.  10/26: transfer to [facility] service, NG tube was clamped and output is very minimal, general surgery recommended to start clear liquid diet, NGT removed. No significant overnight events 10/27: Clinically continues to improve, creat trending down, better HD. WBC up bu no fevers and drain is serous and midline wound is healing well. Probably time for diuresis. If wbc continue to be up or if he spikes a fever or has any set back will need repeat CT A/P w po contrast only. 10/28:  No significant events overnight.  In the morning time patient was complaining of some abdominal discomfort, feeling better  now.  WBC count 35.5  significant elevated, discussed with general surgery, CT a/p shows free gas, multiple fluid collections without definitive abscess, ESR 89, Pro-Cal 0.88, CRP 23.3. Tolerating diet well for now.  Denied any chest pain or palpitations. 10/29: Psych consulted for anxiety. Fluid culture from JP drain and meropenem started due to persistent leukocytosis. Sputum culture- serratia marcescens- resistant to cefazolin only s/p zosyn, on meropenem. 10/30: Overnight bright red blood reported per ileostomy, heparin held. Hgb stable. IR consulted for persistent leukocytosis and management of intra-abdominal fluid collections with plan for aspiration and possible drain placement. 2 fluid collections aspirated and 3rd fluid collection underwent drain placement. Post procedure patient transferred to ICU urgently due to worsening mental status, hypercapnia and increased WOB. Patient required emergent intubation and mechanical ventilatory support upon arrival. 10/31: Patient passed SBT and extubated to [facility] 11/01: Transferred to [facility] service on RA. Remains on Meropenem and Micafungin per ID 11/02: iv Heparin resumed, Eliquis held. No significant events 11/03:Patient went into rapid a fib/tachycardia with soft blood pressure. Hemoglobin down to 6.0. Received IV metoprolol. PCCM re-consulted due to high risk for re-intubation and decompensation 11/04: No significant events overnight 11/04: No significant event sovernight  Consults:  Cardiology PCCM ID  Significant Diagnostic Tests:  10/22: CT Abd Pelvis Ill-defined slightly high density fluid along the ileocolic suture line, which may represent a small amount of extravasated contrast/leak. Small to moderate amount of pneumoperitoneum and small amount of ascites within the abdomen and pelvis, not unexpected given surgery but  nonspecific. No evidence of focal collection/defined abscess. Trace bilateral pleural effusions and bibasilar atelectasis.   10/22: CTA Chest no  definite evidence of pulmonary embolus. Minimal bilateral pleural effusions are noted with adjacent subsegmental atelectasis. Pneumoperitoneum is noted in visualized portion of upper abdomen consistent with reported history of recent laparoscopic right colectomy and cholecystectomy.  10/23: CT Abd Pelvis while there is no evidence for gross enteric contrast extravasation at the level of the ileocolic anastomosis, the small fluid collection adjacent to the anastomosis identified on yesterday's study has increased in attenuation in the interval. This increase in attenuation is concerning for some trace spill of contrast from the anastomosis into the collection. Slight increase in fluid volume in the right paracolic gutter with similar fluid volume around the liver and in the left paracolic gutter. Collections of interloop mesenteric fluid are not substantially changed. Free gas in the peritoneal cavity is not unexpected 4 days out from surgery. Gas in the midline subcutaneous tissues deep to the staple line has increased in the interval. Contrast in the kidneys compatible with residual from yesterday's infused scan. Bibasilar atelectasis with tiny bilateral pleural effusions.   10/28 CT A/P: Decreased free gas, multiple fluid collections no definitive abscess due to lack of IV contrast.  Small bilateral pleural effusions.   10/30 IR CT aspiration of left anterior abdominal fluid collection, aspiration of left posterolateral abdominal fluid collection and catheter drainage of perihepatic fluid collection: Left anterior fluid collection: clear, yellow fluid. Sample sent for culture. 90 mL aspirated via 18 G trocar needle resulting in complete decompression. Left posterolateral fluid collection: slightly turbid, darker yellow fluid. Sample sent for culture. 50 mL aspirated resulting in near-complete decompression. Perihepatic fluid collection: clear, yellow fluid. 10 Fr drain placed in perihepatic fluid collection  given its larger size. Drain attached to suction bulb drainage.  Interim History / Subjective::  - hemoglobin stable at 9.0  Micro Data:  10/30:Abdominal fluid> pending 10/29: JP Drain>FEW CANDIDA ALBICANS  RARE LACTOBACILLUS SPECIES  10/22: MRSA PCR>> NOT DETECTED 10/24; Respiratory culture>MODERATE SERRATIA MARCESCENS  FEW CANDIDA ALBICAN   Current Antimicrobials:  Meropenem 10/29>> Micafungin 10/30>>  OBJECTIVE  Blood pressure 122/60, pulse 70, temperature 98.2 F (36.8 C), temperature source Oral, resp. rate (!) 22, height _0  (1.88 m), weight 122.6 kg, SpO2 94 %.        Intake/Output Summary (Last 24 hours) at 10/11/2022 0325 Last data filed at 10/11/2022 0232 Gross per 24 hour  Intake 1172.05 ml  Output 2150 ml  Net -977.95 ml    Filed Weights   10/08/22 0453 10/09/22 0500 10/10/22 0300  Weight: 126 kg 126.4 kg 122.6 kg   Physical Examination  GENERAL:  66 y.o.-year-old patient lying in the bed with no acute distress. Appears chronically ill LUNGS: decreased breath sounds bilaterally, no wheezing CARDIOVASCULAR: S1, S2 normal. No murmurs,   ABDOMEN: Soft, non-distended, no rebound/guarding. Surgical drain with serosanguinous output. 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 Newly placed drain in RUQ; serous fluid EXTREMITIES: dependent edema b/l.    NEUROLOGIC: nonfocal  patient is alert and awake SKIN: No obvious rash, lesion, or ulcer.  Labs/imaging that I havepersonally reviewed  (right click and "Reselect all SmartList Selections" daily)     Labs   CBC: Recent Labs  Lab 10/06/22 0457 10/06/22 1135 10/07/22 0510 10/08/22 0554 10/09/22 0613 10/09/22 1759 10/10/22 0340  WBC 31.1*  --  19.8* 20.6* 17.6*  --  20.3*  NEUTROABS 22.8*  --  16.3* 16.7* 13.8*  --  15.7*  HGB 8.2*   < > 8.0* 7.3* 6.0* 9.1* 9.0*  HCT 27.3*   < > 26.6* 24.1* 19.1* 28.7* 28.7*  MCV 81.0  --  79.4* 80.3 78.3*  --  78.4*  PLT 499*   --  422* 437* 369  --  501*   < > = values in this interval not displayed.     Basic Metabolic Panel: Recent Labs  Lab 10/05/22 0440 10/05/22 1842 10/05/22 2045 10/05/22 2207 10/06/22 0457 10/07/22 0510 10/08/22 0554 10/09/22 0613 10/10/22 0340  NA 136   < >  --    < > 139 141 142 144 140  K 4.4   < >  --    < > 4.8 4.7 4.0 3.1* 4.7  CL 106   < >  --    < > 107 109 110 116* 108  CO2 25   < >  --    < > _1 GLUCOSE 248*   < >  --    < > 71 191* 142* 96 102*  BUN 31*   < >  --    < > 35* 34* 29* 22 29*  CREATININE 1.32*   < >  --    < > 1.52* 1.43* 1.20 0.86 1.31*  CALCIUM 8.4*   < >  --    < > 8.7* 8.8* 8.5* 6.8* 8.7*  MG 1.8  --  1.7  --  1.8 2.0  --   --  1.7  PHOS 3.1  --  3.8  --  4.0 3.2 2.3* 1.8* 3.9   < > = values in this interval not displayed.    GFR: Estimated Creatinine Clearance: 77.2 mL/min (A) (by C-G formula based on SCr of 1.31 mg/dL (H)). Recent Labs  Lab 10/05/22 1842 10/06/22 0022 10/06/22 0110 10/06/22 3220 10/07/22 0510 10/08/22 2542 10/09/22 7062  10/10/22 0340  WBC 29.4*  --   --  31.1* 19.8* 20.6* 17.6* 20.3*  LATICACIDVEN 1.2 1.3 2.0* 1.4  --   --   --   --      Liver Function Tests: Recent Labs  Lab 10/05/22 1842 10/05/22 2207 10/06/22 0110  AST _0 ALT _1 ALKPHOS 59 63 64  BILITOT 1.1 0.5 0.5  PROT 5.7* 6.2* 6.7  ALBUMIN 1.6* 1.7* 2.2*    No results for input(s): "LIPASE", "AMYLASE" in the last 168 hours. No results for input(s): "AMMONIA" in the last 168 hours.  ABG    Component Value Date/Time   PHART 7.29 (L) 10/05/2022 1920   PCO2ART 57 (H) 10/05/2022 1920   PO2ART 90 10/05/2022 1920   HCO3 27.4 10/05/2022 1920   ACIDBASEDEF 0.3 10/05/2022 1920   O2SAT 98 10/05/2022 1920     Coagulation Profile: Recent Labs  Lab 10/05/22 0440 10/09/22 0613  INR 1.3* 1.5*     Cardiac Enzymes: No results for input(s): "CKTOTAL", "CKMB", "CKMBINDEX", "TROPONINI" in the last 168  hours.  HbA1C: Hemoglobin A1C  Date/Time Value Ref Range Status  06/16/2012 10:17 AM 7.2 (H) 4.2 - 6.3 % Final    Comment:    The American Diabetes Association recommends that a primary goal of therapy should be <7% and that physicians should reevaluate the treatment regimen in patients with HbA1c values consistently >8%.    Hgb A1c MFr Bld  Date/Time Value Ref Range Status  09/24/2022 05:44 PM 9.8 (H) 4.8 - 5.6 % Final    Comment:    (NOTE) Pre diabetes:          5.7%-6.4%  Diabetes:              >6.4%  Glycemic control for   <7.0% adults with diabetes   08/08/2021 01:43 PM 7.4 (H) 4.8 - 5.6 % Final    Comment:    (NOTE) Pre diabetes:          5.7%-6.4%  Diabetes:              >6.4%  Glycemic control for   <7.0% adults with diabetes     CBG: Recent Labs  Lab 10/10/22 0738 10/10/22 1142 10/10/22 1604 10/10/22 1935 10/10/22 2307  GLUCAP 108* 225* 164* 91 81     Past Medical History  He,  has a past medical history of Adenocarcinoma of colon ([place]) (09/08/2022), Cholelithiasis, Chronic gastritis, Colon polyps, Coronary artery calcification seen on CT scan, Diastolic dysfunction, Diverticulosis, Hypertension, Long term current use of anticoagulant, Nephrolithiasis, Paroxysmal A-fib ([place]), and T2DM (type 2 diabetes mellitus) ([place]).   Surgical History    Past Surgical History:  Procedure Laterality Date   CARDIOVERSION N/A 12/22/2018   Procedure: CARDIOVERSION (CATH LAB);  Surgeon: Corey Skains, MD;  Location: [facility] ORS;  Service: Cardiovascular;  Laterality: N/A;   CARDIOVERSION N/A 08/18/2021   Procedure: CARDIOVERSION;  Surgeon: Corey Skains, MD;  Location: [facility] ORS;  Service: Cardiovascular;  Laterality: N/A;   CARDIOVERSION N/A 03/11/2022   Procedure: CARDIOVERSION;  Surgeon: Corey Skains, MD;  Location: [facility] ORS;  Service: Cardiovascular;  Laterality: N/A;   CARDIOVERSION N/A 03/31/2022   Procedure: CARDIOVERSION;  Surgeon: Corey Skains, MD;  Location: [facility] ORS;  Service: Cardiovascular;  Laterality: N/A;   CHOLECYSTECTOMY N/A 09/24/2022   Procedure: LAPAROSCOPIC CHOLECYSTECTOMY;  Surgeon: Jules Husbands, MD;  Location: [facility] ORS;  Service: General;  Laterality: N/A;   COLONOSCOPY  WITH PROPOFOL N/A 09/08/2022   Procedure: COLONOSCOPY WITH PROPOFOL;  Surgeon: Lucilla Lame, MD;  Location: [facility] ENDOSCOPY;  Service: Endoscopy;  Laterality: N/A;   ELECTROPHYSIOLOGIC STUDY N/A 07/09/2016   Procedure: CARDIOVERSION;  Surgeon: Dionisio David, MD;  Location: [facility] ORS;  Service: Cardiovascular;  Laterality: N/A;   LAPAROSCOPIC RIGHT COLECTOMY N/A 09/24/2022   Procedure: LAPAROSCOPIC RIGHT COLECTOMY, RNFA to assist;  Surgeon: Jules Husbands, MD;  Location: [facility] ORS;  Service: General;  Laterality: N/A;   LAPAROTOMY N/A 09/28/2022   Procedure: EXPLORATORY LAPAROTOMY WITH OSTOMY CREATION;  Surgeon: Jules Husbands, MD;  Location: [facility] ORS;  Service: General;  Laterality: N/A;   TEE WITHOUT CARDIOVERSION N/A 07/09/2016   Procedure: TRANSESOPHAGEAL ECHOCARDIOGRAM (TEE);  Surgeon: Dionisio David, MD;  Location: [facility] ORS;  Service: Cardiovascular;  Laterality: N/A;   web fingers repaired     as a child     Social History   reports that he has never smoked. He has never been exposed to tobacco smoke. He has never used smokeless tobacco. He reports that he does not drink alcohol and does not use drugs.   Family History   His family history includes COPD in his father; Diabetes in his mother; Heart disease in his mother; Varicose Veins in his mother.   Allergies No Known Allergies   Home Medications  Prior to Admission medications   Medication Sig Start Date End Date Taking? Authorizing Provider  bisacodyl (DULCOLAX) 5 MG EC tablet Take all 4 tablets at 8 am the morning prior to your surgery. 09/14/22  Yes Pabon, Diego F, MD  Cholecalciferol (VITAMIN D) 50 MCG (2000 UT) CAPS Take 2,000 Units by mouth daily.   Yes [provider]   flecainide (TAMBOCOR) 100 MG tablet Take 100 mg by mouth 2 (two) times daily. 03/17/22  Yes [provider]  glipiZIDE (GLUCOTROL XL) 10 MG 24 hr tablet Take 10 mg by mouth 2 (two) times daily.    Yes [provider]  metFORMIN (GLUCOPHAGE-XR) 500 MG 24 hr tablet Take 500 mg by mouth daily with breakfast. 02/10/21  Yes [provider]  metoprolol (LOPRESSOR) 100 MG tablet Take 100 mg by mouth 2 (two) times daily.   Yes [provider]  metroNIDAZOLE (FLAGYL) 500 MG tablet Take 2 tablets at 8AM, take 2 tablets at [facility], and take 2 tablets at 8PM the day prior to your surgery 09/14/22  Yes Pabon, Diego F, MD  Multiple Vitamins-Minerals (MULTIVITAMIN WITH MINERALS) tablet Take 1 tablet by mouth daily with breakfast.    Yes [provider]  neomycin (MYCIFRADIN) 500 MG tablet Take 2 tablet at 8am, take 2 tablets at 2pm, and take 2 tablets at 8pm the day prior to your surgery 09/14/22  Yes Pabon, Diego F, MD  Omega-3 Fatty Acids (FISH OIL) 1000 MG CAPS Take 1,000 mg by mouth daily.   Yes [provider]  OZEMPIC, 1 MG/DOSE, 4 MG/3ML SOPN Inject 1 mg into the skin every Wednesday. Patient stopped taking these medications 08/02/21  Yes [provider]  polyethylene glycol powder (MIRALAX) 17 GM/SCOOP powder Mix full container in 64 ounces of Gatorade or other clear liquid. NO Red 09/14/22  Yes Pabon, Gratz, MD  Turmeric 400 MG CAPS Take 400 mg by mouth daily.   Yes [provider]  vitamin B-12 (CYANOCOBALAMIN) 100 MCG tablet Take 500 mcg by mouth daily.   Yes [provider]  Vitamin E 400 units TABS Take 400 Units by  mouth daily.   Yes [provider]  apixaban (ELIQUIS) 5 MG TABS tablet Take 5 mg by mouth 2 (two) times daily.    [provider]  Coenzyme Q10 (COQ10) 100 MG CAPS Take 100 mg by mouth daily.    [provider]    Scheduled Meds:  acetaminophen  1,000 mg Per Tube Q6H   amiodarone  200 mg  Per Tube Daily   Chlorhexidine Gluconate Cloth  6 each Topical Daily   diphenhydrAMINE  25 mg Oral QHS   insulin aspart  0-20 Units Subcutaneous Q4H   insulin aspart  8 Units Subcutaneous Q4H   insulin detemir  20 Units Subcutaneous BID   loperamide HCl  4 mg Per Tube BID   metoprolol tartrate  50 mg Per Tube BID   pantoprazole  40 mg Oral Daily   sodium chloride flush  10-40 mL Intracatheter Q12H   sodium chloride flush  5 mL Intracatheter Q8H   Continuous Infusions:  sodium chloride Stopped (10/06/22 1416)   heparin 2,500 Units/hr (10/11/22 0232)   meropenem (MERREM) IV 1 g (10/10/22 2120)   micafungin (MYCAMINE) 100 mg in sodium chloride 0.9 % 100 mL IVPB 100 mg (10/10/22 2333)   PRN Meds:.dextrose, metoprolol tartrate, ondansetron **OR** ondansetron (ZOFRAN) IV, mouth rinse, oxyCODONE, sodium chloride flush, traMADol   Active Hospital Problem list     Assessment & Plan:  .Acute Hypoxic Hypercapnic Respiratory Failure secondary Aspiration Pneumonia -Supplemental O2 as needed to maintain O2 saturations 88 to 92% -High risk for intubation -Follow intermittent ABG and chest x-ray as needed -Repeat CT Slight increased bilateral pleural effusion and lower lobe consolidations  concerning for atelectasis, pneumonia, OR aspiration, Continue Chest PT and aggressive pulmonary toileting with insentive spirometry  Adenocarcinoma of Right Colon s/p exploratory laparotomy, abdominal washout, and diverting loop ileostomy for intra-abdominal purulent fluid after initial open right colectomy with open cholecystectomy for right colon cancer and cholelithiasis on 10/19  -Repeat CT abd/pelvis no retroperitoneal bleed or hematomas *Continue FLD + nutritional supplementation  -Continue enteric feeding  advancing as tolerating. Still with poor PO intake -Wound care per General surgery -Oncology following as well, overall poor prognosis  Sepsis in the setting of suspected Aspiration and  Intraabdominal Abscesses +Candida albicans in the post colon culture -Monitor fever curve -Trend WBC's & Procalcitonin -Follow cultures as above -Continue Continue meropenem and Micafungin per ID recs -ID input appreciated  Paroxysmal A-fib (HCC) Currently on amiodarone and metoprolol 11/1--holding Eliquis and IV heparin gtt   T2DM HgbA1c 9.8 -CBG's Q4hr; Target range of 140 to 180 -SSI -Follow ICU Hypo/Hyperglycemia protocol  Anemia Multifactorial, Acute blood loss vs Microcyctic S/p PRBCs on 10/30 and 11/3 Hgb stable -Monitor for s&s of bleeding -Transfuse for hgb <7   Best practice:  Diet:  Tube Feed  Pain/Anxiety/Delirium protocol (if indicated): No VAP protocol (if indicated): Not indicated DVT prophylaxis: Contraindicated GI prophylaxis: PPI Glucose control:  SSI Yes Central venous access:  N/A Arterial line:  N/A Foley:  N/A Mobility:  bed rest  PT consulted: N/A Last date of multidisciplinary goals of care discussion [11/4] Code Status:  full code Disposition: ICU   = Goals of Care = Code Status Order: FULL  Primary Emergency Contact: Onorato,[name] Wishes to pursue full aggressive treatment and intervention options, including CPR and intubation, but goals of care will be addressed on going with family if that should become necessary.   Critical care time: 45 minutes       Rufina Falco,  DNP, CCRN, FNP-C, AGACNP-BC Acute Care Nurse Practitioner [facility]  PCCM on call pager [phone] until 7 am

## 2022-10-12 ENCOUNTER — Inpatient Hospital Stay: Payer: Medicare Other | Admitting: Oncology

## 2022-10-12 ENCOUNTER — Inpatient Hospital Stay: Payer: Medicare Other

## 2022-10-12 DIAGNOSIS — K651 Peritoneal abscess: Secondary | ICD-10-CM | POA: Diagnosis not present

## 2022-10-12 DIAGNOSIS — I480 Paroxysmal atrial fibrillation: Secondary | ICD-10-CM | POA: Diagnosis not present

## 2022-10-12 DIAGNOSIS — C189 Malignant neoplasm of colon, unspecified: Secondary | ICD-10-CM | POA: Diagnosis not present

## 2022-10-12 LAB — TYPE AND SCREEN
ABO/RH(D): O NEG
Antibody Screen: NEGATIVE
Unit division: 0

## 2022-10-12 LAB — BASIC METABOLIC PANEL
Anion gap: 6 (ref 5–15)
BUN: 27 mg/dL — ABNORMAL HIGH (ref 8–23)
CO2: 23 mmol/L (ref 22–32)
Calcium: 8.3 mg/dL — ABNORMAL LOW (ref 8.9–10.3)
Chloride: 106 mmol/L (ref 98–111)
Creatinine, Ser: 1.14 mg/dL (ref 0.61–1.24)
GFR, Estimated: >60 mL/min (ref >60)
Glucose, Bld: 155 mg/dL — ABNORMAL HIGH (ref 70–99)
Potassium: 4.3 mmol/L (ref 3.5–5.1)
Sodium: 135 mmol/L (ref 135–145)

## 2022-10-12 LAB — CBC WITH DIFFERENTIAL/PLATELET
Abs Immature Granulocytes: 0.18 10*3/uL — ABNORMAL HIGH (ref 0.00–0.07)
Basophils Absolute: 0.1 10*3/uL (ref 0.0–0.1)
Basophils Relative: 1 %
Eosinophils Absolute: 0.2 10*3/uL (ref 0.0–0.5)
Eosinophils Relative: 1 %
HCT: 26 % — ABNORMAL LOW (ref 39.0–52.0)
Hemoglobin: 8.3 g/dL — ABNORMAL LOW (ref 13.0–17.0)
Immature Granulocytes: 1 %
Lymphocytes Relative: 12 %
Lymphs Abs: 2.1 10*3/uL (ref 0.7–4.0)
MCH: 24.9 pg — ABNORMAL LOW (ref 26.0–34.0)
MCHC: 31.9 g/dL (ref 30.0–36.0)
MCV: 77.8 fL — ABNORMAL LOW (ref 80.0–100.0)
Monocytes Absolute: 1.8 10*3/uL — ABNORMAL HIGH (ref 0.1–1.0)
Monocytes Relative: 11 %
Neutro Abs: 12.7 10*3/uL — ABNORMAL HIGH (ref 1.7–7.7)
Neutrophils Relative %: 74 %
Platelets: 450 10*3/uL — ABNORMAL HIGH (ref 150–400)
RBC: 3.34 MIL/uL — ABNORMAL LOW (ref 4.22–5.81)
RDW: 18.1 % — ABNORMAL HIGH (ref 11.5–15.5)
WBC: 17.1 10*3/uL — ABNORMAL HIGH (ref 4.0–10.5)
nRBC: 0.1 % (ref 0.0–0.2)

## 2022-10-12 LAB — HEPARIN LEVEL (UNFRACTIONATED): Heparin Unfractionated: 0.43 IU/mL (ref 0.30–0.70)

## 2022-10-12 LAB — BPAM RBC
Blood Product Expiration Date: 202312072359
ISSUE DATE / TIME: 202311051351
Unit Type and Rh: 5100

## 2022-10-12 LAB — GLUCOSE, CAPILLARY
Glucose-Capillary: 126 mg/dL — ABNORMAL HIGH (ref 70–99)
Glucose-Capillary: 139 mg/dL — ABNORMAL HIGH (ref 70–99)
Glucose-Capillary: 145 mg/dL — ABNORMAL HIGH (ref 70–99)
Glucose-Capillary: 148 mg/dL — ABNORMAL HIGH (ref 70–99)
Glucose-Capillary: 158 mg/dL — ABNORMAL HIGH (ref 70–99)
Glucose-Capillary: 158 mg/dL — ABNORMAL HIGH (ref 70–99)

## 2022-10-12 MED ORDER — INSULIN DETEMIR 100 UNIT/ML ~~LOC~~ SOLN
10.0000 [IU] | Freq: Every day | SUBCUTANEOUS | Status: DC
  Administered 2022-10-13 – 2022-10-19 (×7): 10 [IU] via SUBCUTANEOUS
  Filled 2022-10-12 (×8): qty 0.1

## 2022-10-12 MED ORDER — LOPERAMIDE HCL 1 MG/7.5ML PO SUSP: 2.0000 mg | Freq: Two times a day (BID) | ORAL | Status: DC

## 2022-10-12 MED ORDER — ENSURE ENLIVE PO LIQD
237.0000 mL | Freq: Three times a day (TID) | ORAL | Status: DC
  Administered 2022-10-12 – 2022-10-19 (×15): 237 mL via ORAL

## 2022-10-12 MED ORDER — ADULT MULTIVITAMIN W/MINERALS CH
1.0000 | ORAL_TABLET | Freq: Every day | ORAL | Status: DC
  Administered 2022-10-12 – 2022-10-19 (×7): 1 via ORAL
  Filled 2022-10-12 (×7): qty 1

## 2022-10-12 MED ORDER — TRAMADOL HCL 50 MG PO TABS
50.0000 mg | ORAL_TABLET | Freq: Four times a day (QID) | ORAL | Status: DC | PRN
  Administered 2022-10-12 – 2022-10-19 (×4): 50 mg via ORAL
  Filled 2022-10-12 (×6): qty 1

## 2022-10-12 MED ORDER — PROSOURCE PLUS PO LIQD
30.0000 mL | Freq: Three times a day (TID) | ORAL | Status: DC
  Administered 2022-10-12 – 2022-10-19 (×17): 30 mL via ORAL
  Filled 2022-10-12 (×22): qty 30

## 2022-10-12 MED ORDER — LOPERAMIDE HCL 1 MG/7.5ML PO SUSP
2.0000 mg | Freq: Two times a day (BID) | ORAL | Status: DC
  Filled 2022-10-12: qty 15

## 2022-10-12 MED ORDER — METOPROLOL TARTRATE 50 MG PO TABS
50.0000 mg | ORAL_TABLET | Freq: Two times a day (BID) | ORAL | Status: DC
  Administered 2022-10-12 – 2022-10-19 (×15): 50 mg via ORAL
  Filled 2022-10-12 (×15): qty 1

## 2022-10-12 MED ORDER — ACETAMINOPHEN 500 MG PO TABS
1000.0000 mg | ORAL_TABLET | Freq: Four times a day (QID) | ORAL | Status: DC
  Administered 2022-10-12 – 2022-10-19 (×21): 1000 mg via ORAL
  Filled 2022-10-12 (×23): qty 2

## 2022-10-12 MED ORDER — AMIODARONE HCL 200 MG PO TABS
200.0000 mg | ORAL_TABLET | Freq: Every day | ORAL | Status: DC
  Administered 2022-10-12 – 2022-10-19 (×8): 200 mg via ORAL
  Filled 2022-10-12 (×8): qty 1

## 2022-10-12 MED ORDER — OXYCODONE HCL 5 MG PO TABS
5.0000 mg | ORAL_TABLET | ORAL | Status: DC | PRN
  Administered 2022-10-13: 5 mg via ORAL
  Filled 2022-10-12: qty 1

## 2022-10-12 MED ORDER — APIXABAN 5 MG PO TABS
5.0000 mg | ORAL_TABLET | Freq: Two times a day (BID) | ORAL | Status: DC
  Administered 2022-10-12 – 2022-10-14 (×5): 5 mg via ORAL
  Filled 2022-10-12 (×5): qty 1

## 2022-10-12 MED ORDER — LOPERAMIDE HCL 2 MG PO CAPS
2.0000 mg | ORAL_CAPSULE | Freq: Two times a day (BID) | ORAL | Status: DC
  Administered 2022-10-12 – 2022-10-14 (×5): 2 mg via ORAL
  Filled 2022-10-12 (×4): qty 1

## 2022-10-12 NOTE — Progress Notes (Addendum)
Nutrition Follow-up  DOCUMENTATION CODES:   Obesity unspecified  INTERVENTION:   -48 hour calorie count per MD -MVI with minerals daily -Continue Magic cup TID with meals, each supplement provides 290 kcal and 9 grams of protein  -Ensure Enlive po TID, each supplement provides 350 kcal and 20 grams of protein -30 ml Prosource Plus TID, each supplement provides 100 kcals and 15 grams protein  NUTRITION DIAGNOSIS:   Increased nutrient needs related to post-op healing as evidenced by estimated needs.  Ongoing  GOAL:   Patient will meet greater than or equal to 90% of their needs  Progressing   MONITOR:   PO intake, Supplement acceptance, Labs, Weight trends, TF tolerance, Skin, I & O's  REASON FOR ASSESSMENT:   Rounds    ASSESSMENT:   66 y/o male with h/o DM, HTN, Afib s/p cardioversion, anxiety, HLD, hiatal hernia, cholelithiasis and colon cancer (adenocarcinoma) s/p open right colectomy with ileocolic anastomosis and cholecystectomy 65/78 complicated by anastamotic leak s/p exploratory laparotomy and abdominal washout and diverting loop ileostomy 10/23. Pt also with new DKA and AKI. 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  10/30- s/p IR CT aspiration of left anterior abdominal fluid collection, aspiration of left posterolateral abdominal fluid collection and catheter drainage of perihepatic fluid collection  11/6- holding TF  Reviewed I/O's: -870 ml x 24 hours and -6.6 L since 09/28/22  UOP: 1.8 L x 24 hours  Drain output: 10 ml x 24 hours  Ileostomy output: 325 ml x 24 hours   Case discussed with MD; requesting calorie count to better evaluate oral intake. Pt intake has improved since being advanced to full liquid and soft diet. Noted meal completions 75-100%.   Spoke with pt at bedside, who reports feeling better and happy to be in his new room out of the ICU. He shares that his appetite and abdominal pain have improved. He  is looking forward to eating solid food. He enjoys eating ice cream, but noted two Magic Cups unopened on tray table. He shares he ate a banana, milk, and mashed potatoes for lunch.   Discussed with pt plan for calorie count. Explained that he will need to demonstrate adequate oral intake prior to NGT removal and importance of good meal and supplement intake to promote healing. Pt amenable to Ensure and Magic Cups.   Per MD, plan to discharge to CIR.   Labs reviewed: CBGS: 139-226 (inpatient orders for glycemic control are 0-5 units insulin aspart daily at bedtime, 0-9 units insulin aspart TID with meals, and 10 units insulin detemir daily).    Diet Order:   Diet Order             DIET SOFT Room service appropriate? Yes; Fluid consistency: Thin  Diet effective now                   EDUCATION NEEDS:   No education needs have been identified at this time  Skin:  Skin Assessment: Skin Integrity Issues: Skin Integrity Issues:: DTI DTI: lt nare Incisions: closed abdomen  Last BM:  10/11/22 (225 ml via ileostomy)  Height:   Ht Readings from Last 1 Encounters:  09/28/22 '6\' 2"'$  (1.88 m)    Weight:   Wt Readings from Last 1 Encounters:  10/11/22 123.6 kg    Ideal Body Weight:  86.4 kg  BMI:  Body mass index is 34.99 kg/m.  Estimated Nutritional Needs:   Kcal:  4696-2952  Protein:  150-175  grams  Fluid:  > 2 L    Loistine Chance, RD, LDN, [place] Registered Dietitian II Certified Diabetes Care and Education Specialist Please refer to [facility] for RD and/or RD on-call/weekend/after hours pager

## 2022-10-12 NOTE — Progress Notes (Signed)
Occupational Therapy Treatment Patient Details Name: Nicolas Zuniga MRN: [id number] DOB: [birth year] Today's Date: 10/12/2022   History of present illness Pt is a 66 y.o. male s/p exploratory laparotomy, abdominal washout, and diverting loop ileostomy for intra-abdominal purulent fluid after initial open right colectomy with open cholecystectomy for right colon cancer and cholelithiasis on 10/19, RUQ drain placed recently then intubated, extubated 10/31. MD assessment includes: Adenocarcinoma of the colon, septic shock, A-fib, AKI, generalized anxiety disorder, anemia, weakness, acute hypoxic respiratory failure, and hyponatremia.   OT comments  Pt received seated in recliner; just finished PT session, where he transferred to reclnier; wife at bedside. Appearing fatigued, but willing to work with OT on therapeutic exercise. Pt participated in 5 sets of UE reaching forward and up (shoulder flexion to approx 120 degrees) 10 reps each arm. Pt requiring rest breaks between exercises. Pt c/o pain in buttocks; education provided on pt currently being in posterior pelvic tilt and therefore uncomfortable sitting position; encouraged pt to stand and then sit and reposition; pt stating he would like to return to bed instead; OT provided education on importance of sitting up; pt understanding, but continues to request t/f to bed. T/f CGA with RW to stand and sidestep/turn towards bed; CGA t/f into bed; needing further instruction on logroll for technique. See flowsheet below for further details of session. Left semi-reclined in bed with wife in room and with all needs in reach.  Pt continues to be motivated; limited by activity tolerance and pain; requires acute rehab level of care to return to prior level of function.    Recommendations for follow up therapy are one component of a multi-disciplinary discharge planning process, led by the attending physician.  Recommendations may be updated based on patient status,  additional functional criteria and insurance authorization.    Follow Up Recommendations  Acute inpatient rehab (3hours/day) (acute rehab recommendation remains appropriate)    Assistance Recommended at Discharge Frequent or constant Supervision/Assistance  Patient can return home with the following  A lot of help with walking and/or transfers;A lot of help with bathing/dressing/bathroom;Assistance with cooking/housework;Direct supervision/assist for medications management;Direct supervision/assist for financial management;Assist for transportation;Help with stairs or ramp for entrance   Equipment Recommendations  Other (comment) (defer to next venue of care)    Recommendations for Other Services Rehab consult    Precautions / Restrictions Precautions Precautions: Fall Restrictions Weight Bearing Restrictions: No Other Position/Activity Restrictions: Multiple JP drains and ostormy bag       Mobility Bed Mobility Overal bed mobility: Needs Assistance Bed Mobility: Sit to Supine       Sit to supine: Min assist   General bed mobility comments: Pt not performing log roll very well for seated to supine; will need more instruction/practice.    Transfers Overall transfer level: Needs assistance Equipment used: Rolling walker (2 wheels) Transfers: Sit to/from Stand, Bed to chair/wheelchair/BSC Sit to Stand: Min guard (from recliner chair, with use of arms)     Step pivot transfers: Min guard (with RW)           Balance Overall balance assessment: Needs assistance         Standing balance support: Bilateral upper extremity supported, During functional activity, Reliant on assistive device for balance                               ADL either performed or assessed with clinical judgement   ADL  General ADL Comments: Anticipate currently set up/supervision from seated/semi-reclined for UB ADLs;  anticipate MAX A for LB ADLs 2/2 abdominal surgery pain.    Extremity/Trunk Assessment Upper Extremity Assessment Upper Extremity Assessment: Generalized weakness   Lower Extremity Assessment Lower Extremity Assessment: Generalized weakness        Vision       Perception     Praxis      Cognition Arousal/Alertness: Awake/alert Behavior During Therapy: WFL for tasks assessed/performed Overall Cognitive Status: Within Functional Limits for tasks assessed                                          Exercises Other Exercises Other Exercises: Tried to encourage pt to stay up in chair, but c/o too much increased pain in buttocks and abdomen while up in chair.    Shoulder Instructions       General Comments      Pertinent Vitals/ Pain       Pain Assessment Pain Assessment: 0-10 Pain Score: 3  Pain Location: abdomen, right side Pain Descriptors / Indicators: Discomfort, Operative site guarding, Sore Pain Intervention(s): Limited activity within patient's tolerance, Repositioned  Home Living Family/patient expects to be discharged to:: Private residence Living Arrangements: Spouse/significant other Available Help at Discharge: Family;Available 24 hours/day Type of Home: House Home Access: Stairs to enter [employer] of Steps: 3 Entrance Stairs-Rails: Right Home Layout: One level     Bathroom Shower/Tub: Chief Strategy Officer: None      Lives With: Spouse    Prior Functioning/Environment              Frequency  Min 3X/week        Progress Toward Goals  OT Goals(current goals can now be found in the care plan section)  Progress towards OT goals: Progressing toward goals  Acute Rehab OT Goals Patient Stated Goal: get into acute rehab to get better OT Goal Formulation: With patient/family Time For Goal Achievement: 10/21/22 Potential to Achieve Goals: Good ADL Goals Pt Will Perform Grooming: with min  assist;standing Pt Will Perform Lower Body Dressing: with min assist;sit to/from stand;with caregiver independent in assisting Pt Will Transfer to Toilet: with min assist;ambulating;bedside commode  Plan Discharge plan remains appropriate;Frequency remains appropriate    Co-evaluation                 AM-PAC OT "6 Clicks" Daily Activity     Outcome Measure   Help from another person eating meals?: None Help from another person taking care of personal grooming?: A Little Help from another person toileting, which includes using toliet, bedpan, or urinal?: A Lot Help from another person bathing (including washing, rinsing, drying)?: A Lot Help from another person to put on and taking off regular upper body clothing?: A Little Help from another person to put on and taking off regular lower body clothing?: A Lot 6 Click Score: 16    End of Session Equipment Utilized During Treatment: Rolling walker (2 wheels)  OT Visit Diagnosis: Other abnormalities of gait and mobility (R26.89);Muscle weakness (generalized) (M62.81)   Activity Tolerance Patient tolerated treatment well;Patient limited by pain   Patient Left in bed;with call bell/phone within reach;with family/visitor present   Nurse Communication Mobility status        Time: 1020-1045 OT Time Calculation (min): 25 min  Charges: OT  General Charges $OT Visit: 1 Visit OT Treatments $Therapeutic Exercise: 23-37 mins  Waymon Amato, MS, OTR/L   Vania Rea 10/12/2022, 12:09 PM

## 2022-10-12 NOTE — Progress Notes (Signed)
[place] at [place] NAME: Nicolas Zuniga    MR#:  [id number]  DATE OF BIRTH:  1956/11/20  SUBJECTIVE:  seen earlier in the day. No family at bedside. Uneventful day yesterday and today so far. Tolerating full liquid. No fever. Vitals stable. Patient communicating well awake and alert.  VITALS:  Blood pressure 126/66, pulse 73, temperature (!) 97.5 F (36.4 C), temperature source Oral, resp. rate 16, height '6\' 2"'$  (1.88 m), weight 123.6 kg, SpO2 95 %.  PHYSICAL EXAMINATION:   GENERAL:  66 y.o.-year-old patient lying in the bed with no acute distress. Appears chronically ill NG+ LUNGS: decreased breath sounds bilaterally, no wheezing CARDIOVASCULAR: S1, S2 normal. No murmurs,   ABDOMEN: Soft, non-distended, no rebound/guarding. Surgical drain with serosanguinous output. Diverting loop colostomy in right abdomen Newly placed drain in RUQ; serous fluid EXTREMITIES: dependent edema b/l.   PICC + NEUROLOGIC: nonfocal  patient is alert and awake SKIN: No obvious rash, lesion, or ulcer.   LABORATORY PANEL:  CBC Recent Labs  Lab 10/12/22 0428  WBC 17.1*  HGB 8.3*  HCT 26.0*  PLT 450*     Chemistries  Recent Labs  Lab 10/06/22 0110 10/06/22 0457 10/11/22 0435 10/12/22 0428  NA 138   < > 137 135  K 4.9   < > 4.6 4.3  CL 107   < > 108 106  CO2 26   < > 24 23  GLUCOSE 156*   < > 94 155*  BUN 35*   < > 30* 27*  CREATININE 1.45*   < > 1.15 1.14  CALCIUM 8.7*   < > 8.4* 8.3*  MG  --    < > 2.0  --   AST 20  --   --   --   ALT 11  --   --   --   ALKPHOS 64  --   --   --   BILITOT 0.5  --   --   --    < > = values in this interval not displayed.     RADIOLOGY:  No results found.  Assessment and Plan  Nicolas Zuniga is a 66 y.o. male with hx of recently diagnosed adenocarcinoma of the colon who is currently admitted status post right colectomy, diabetes, hypertension, and A-fib status post multiple ablations currently on metoprolol  and flecainide.  09/24/2022 - Underwent a laparoscopic right colectomy that was converted to open right colectomy along with a cholecystectomy - no significant complications and EBL was minimal at 80 cc.   10/20: Seen early morning by Dr. Dahlia Byes (general surgery) and was doing well.  Starting around 6 PM he began to have hypotension 70/41 and the hospitalist service was consulted. Pt was given total of 25 g of albumin, 2 L of normal saline, and started on glucagon gtt concern for beta blocker toxicity. His metoprolol and flecainide were held. BP improved. Mild AKI on labs Cr 1.93 down form admission 1.23, likely hypovolemia. D/c glucagon. Strict I&O. Continue hold beta blocker and flecainide, may reduce dose metoprolol on discharge.  10/21: BP soft but improved 115/53, 117/64. Cr improved 1.66. Hgb stable.  Rapid response overnight, A-fib RVR.  Transferred to stepdown, amiodarone infusion, minimal response, diltiazem infusion.  Dr. Humphrey Rolls, cardiology, aware. Dr Dahlia Byes ordering CT chest/abdomen/pelvis, starting heparin drip. CT no PE, (+) Small bowel leak, certainly no contrast extravasation and seems contained --> plan repeat CT, hold off on surgical intervention unless  emergent, starting abx.  10/23: Patient went for repeat CT noting increased extravasation concerning for anastomotic leak.  Patient taken back to the OR and well no leak found, underwent bowel follow-through and washout.  Postop, patient in ICU and still intubated.  ABG notes severe respiratory acidosis.  Note that should patient remain on ventilator, will have critical care assumed medical management in conjunction with surgery. 10/24 for worsening acute renal failure, nephrology consulted.  Patient with bradycardia overnight and Cardizem and Precedex discontinued. 10/25 extubated, off vasopressors, weaned off insulin drip to subcutaneous insulin 10/28.  CT scan showing.  Decreasing intraperitoneal free gas, free fluid on the left liver similar.   Multiple fluid collections in the mesentery. 10/30 interventional radiology performed a IR drainage procedure.  Patient ordered a unit of packed red blood cells on a hemoglobin of 7.2.  I started Lasix for 2 doses. 10/31--remained in ICU was intubated for 24 hours. 11/1--Extubated yday--Dobhoff feeding and ok to cont CLD. No bleeding. OK to start po eliquis (d/w dr Dahlia Byes). Will transfer out of ICU. Resume PT 11/2-- poor PO intake. Continuing G-tube. Resumed IV heparin drip due to drop in hemoglobin. Holding eliquis. 11/3-- hemoglobin drop down to 6.0. Given one unit blood transfusion ,patient is on IV heparin drip. Blood pressure soft. Went into sinus tach/a fib received IV metoprolol 5 mg times one. Heart rate and 90 to 100. Dr. Clayborn Bigness recommends hold off on IV amiodarone drip. Patient looks unwell/sick. Will move him to step down ICU. Discussed with Dr. Adora Fridge and agreeable. Dr. Janese Banks to see patient. Patient started on lomotil for increased ostomy output by surgery 11/4-- no overnight events. Patient remains in sinus rhythm. Feels much better. More awake and alert. Tolerating MG feeding. Resumed IV heparin drip after discussing with surgery. Repeat CT abdomen on11/3 shows decreased fluid collections. No retroperitoneal hematoma. Patient remains in sinus rhythm. Resume full liquid diet. 11/5-- remains hemodynamically stable. Tolerating full liquid diet will continue with Dr. today. Okay to resume PT. Orders placed. Vitals remains stable. Labs stable. Will transfer out to PCU 11/6-- patient remains hemodynamically stable. Surgery advanced to soft diet. Okay to switch to PO eliquis per surgery. Physical therapy to work with patient. Continue IV antibiotics. Will transfer out of ICU. Dietitian to do calorie count.     Assessment and Plan:  Adenocarcinoma of colon (HCC)/large neuroendocrine tumor Right colon cancer status post laparoscopic right colectomy and cholecystectomy on 09/24/2022. Intra-abdominal  infection status post exploratory laparotomy with abdominal washout and diverting loop ileostomy on 10/23 10/29 Meropenem started with persistent leukocytosis and Zosyn discontinued. 10/30 interventional radiology did a CT aspiration of left anterior abdominal fluid collection and placed a drain for the  perihepatic fluid collection. --pt on IV meropenem and Micafungin--per ID -- Dr. Janese Banks consutled   Septic shock [facility]) Septic shock has resolved.  Off pressors at this point.     Paroxysmal A-fib (HCC) Currently on amiodarone and metoprolol 11/1--started Eliquis (d/w surgery and [facility] cardiology) --11/2--back on IV heparin gtt  AKI (acute kidney injury) ([place]) Creatinine peaked at 4.61 on 09/30/2022 improved down to 1.43   Generalized anxiety disorder Cont lexapro   Microcytic anemia Hgb 8.4   Weakness Continue working with physical therapy   Acute hypoxic respiratory failure [facility]) Patient required intubation during the hospital course.   Weaned to RA   Hyponatremia Last sodium normal range--resolved   Diabetes ([place]) Type 2 diabetes mellitus on long-acting insulin with being on Tube feeding Last hemoglobin A1c 9.8.  Nutrition Status: Nutrition Problem: Increased nutrient needs Etiology: post-op healing Signs/Symptoms: estimated needs Interventions: Refer to RD note for recommendations -- patient currently on tube feeding. -- poor PO intake  Patient has been evaluated by inpatient rehab Dr.Raulkar  Procedures:as above Family communication none today Consults :Gen surgery, ID, PCCM CODE STATUS: FULL DVT Prophylaxis :eliquis Level of care: Telemetry Medical Status is: Inpatient Remains inpatient appropriate because: complex post surgical complications      TOTAL TIME TAKING CARE OF THIS PATIENT: 35 minutes.  >50% time spent on counselling and coordination of care  Note: This dictation was prepared with Dragon dictation along with smaller phrase technology. Any  transcriptional errors that result from this process are unintentional.  Fritzi Mandes M.D    [facility]   CC: Primary care physician; Hortencia Pilar, MD

## 2022-10-12 NOTE — Progress Notes (Signed)
[facility] Hospital Day(s): 18.   Post op day(s): 14 Days Post-Op.   Interval History:  Patient seen and examined Doing much better HR better controlled; regular on monitor; on PO Amiodarone/Metoprolol  Patient reports he is feeling better Frustrated about being in bed so much No abdominal pain, nausea, emesis  His leukocytosis has improved again this AM; 17.1K Hgb seems stable in last 24 hours; 8.3 PLT 450K Renal function normal; sCr - 1.14; UO - 1760 No electrolyte derangements this morning  Surgical drain with minimal output; serous Percutaneous drain (RUQ);10 ccs; serous Cx x3 from (10/30) are without bacteria; few candida albicans on left posterior Cx  He continues on Meropenem and micafungin; ID on-board He continues to have ostomy function; 325 ccs recorded  Enteric feedings held On FLD; tolerating better  Vital signs in last 24 hours: [min-max] current  Temp:  [98 F (36.7 C)-98.2 F (36.8 C)] 98.2 F (36.8 C) (11/05 1936) Pulse Rate:  [63-80] 65 (11/06 0400) Resp:  [16-30] 22 (11/06 0400) BP: (100-136)/(52-74) 119/54 (11/06 0400) SpO2:  [88 %-96 %] 92 % (11/06 0400)     Height: '6\' 2"'$  (188 cm) Weight: 123.6 kg BMI (Calculated): 34.97   Intake/Output last 2 shifts:  11/05 0701 - 11/06 0700 In: 1225.4 [I.V.:493.3; NG/GT:135; IV Piggyback:597.1] Out: 2095 [Urine:1760; Drains:10; Stool:325]   Physical Exam:  Constitutional: Alert, awake, NAD Respiratory: Breathing non-labor, no respiratory distress, on RA Cardiac: regular rate, regular rhythm  Gastrointestinal: Soft, no abdominal pain, non-distended, no rebound/guarding. Surgical drain with serosanguinous output. 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 Newly placed drain in RUQ; serous fluid Integumentary: Midline laparotomy healing via secondary intention, no erythema, no drainage, no evidence of fistula  Labs:      Latest Ref Rng & Units 10/12/2022    4:28 AM 10/11/2022    4:35 AM 10/10/2022    3:40 AM  CBC  WBC 4.0 - 10.5 K/uL 17.1  18.6  20.3   Hemoglobin 13.0 - 17.0 g/dL 8.3  8.4  9.0   Hematocrit 39.0 - 52.0 % 26.0  26.7  28.7   Platelets 150 - 400 K/uL 450  459  501       Latest Ref Rng & Units 10/12/2022    4:28 AM 10/11/2022    4:35 AM 10/10/2022    3:40 AM  CMP  Glucose 70 - 99 mg/dL 155  94  102   BUN 8 - 23 mg/dL '27  30  29   '$ Creatinine 0.61 - 1.24 mg/dL 1.14  1.15  1.31   Sodium 135 - 145 mmol/L 135  137  140   Potassium 3.5 - 5.1 mmol/L 4.3  4.6  4.7   Chloride 98 - 111 mmol/L 106  108  108   CO2 22 - 32 mmol/L '23  24  27   '$ Calcium 8.9 - 10.3 mg/dL 8.3  8.4  8.7      Imaging studies: No new pertinent imaging studies   Assessment/Plan:  66 y.o. male doing much better 14 Days Post-Op s/p exploratory laparotomy, abdominal washout, and diverting loop ileostomy for intra-abdominal purulent fluid after initial open right colectomy with open cholecystectomy for right colon cancer and cholelithiasis on 10/19               - Will advance to soft diet for lunch + nutritional supplementation              -  Okay to continue to hold enteric feedings given diet advancement. If he does well with soft diet, I think we can remove NGT  - Okay for Eliquis              - Monitor leukocytosis; improving             - Monitor H&H; stable             - Continue IV Abx (Meropenem); also on Micafungin; ID following             - Follow up 10/30 Cx; no growth to date             - Monitor abdominal examination - Monitor on-going ileostomy function; imodium started 11/02             - Midline wound care: Pack daily with saline moistened gauze, cover, secure             - Continue surgical drain; monitor and record output  - continue percutaneous drain (10/31); monitor and record output              - Pain control prn; antiemetics prn             - Appreciate cardiology assistance/recommendations   -  Oncology on board as well             - Therapies on board; recommending inpatient rehab             - Appreciate medicine assistance as well                - Discharge Planning; Making improvements over the weekend, still certainly not ready for DC. Please resume therapies today   All of the above findings and recommendations were discussed with the patient, and the medical team, and all of patient's questions were answered to his expressed satisfaction.  -- Edison Simon, PA-C [facility] 10/12/2022, 7:33 AM M-F: 7am - 4pm

## 2022-10-12 NOTE — Progress Notes (Signed)
[facility] Cardiology    SUBJECTIVE: Patient improved still weak but no pain no fever chills or sweats.  Wife not at bedside awaiting for physical therapy did not have it yesterday   Vitals:   10/11/22 2200 10/11/22 2300 10/12/22 0000 10/12/22 0400  BP: (!) 132/59 125/62 122/61 (!) 119/54  Pulse: 80 80 75 65  Resp: (!) 30 (!) 29 (!) 30 (!) 22  Temp:      TempSrc:      SpO2: 96% 96% 95% 92%  Weight:      Height:         Intake/Output Summary (Last 24 hours) at 10/12/2022 4259 Last data filed at 10/12/2022 0600 Gross per 24 hour  Intake 1225.37 ml  Output 2095 ml  Net -869.63 ml      PHYSICAL EXAM  General: Well developed, well nourished, in no acute distress HEENT:  Normocephalic and atramatic Neck:  No JVD.  Lungs: Clear bilaterally to auscultation and percussion. Heart: HRRR . Normal S1 and S2 without gallops or murmurs.  Abdomen: Bowel sounds are positive, abdomen soft and non-tender  Msk:  Back normal, normal gait. Normal strength and tone for age. Extremities: No clubbing, cyanosis or edema.   Neuro: Alert and oriented X 3. Psych:  Good affect, responds appropriately   LABS: Basic Metabolic Panel: Recent Labs    10/10/22 0340 10/11/22 0435 10/12/22 0428  NA 140 137 135  K 4.7 4.6 4.3  CL 108 108 106  CO2 '27 24 23  '$ GLUCOSE 102* 94 155*  BUN 29* 30* 27*  CREATININE 1.31* 1.15 1.14  CALCIUM 8.7* 8.4* 8.3*  MG 1.7 2.0  --   PHOS 3.9 3.3  --    Liver Function Tests: No results for input(s): "AST", "ALT", "ALKPHOS", "BILITOT", "PROT", "ALBUMIN" in the last 72 hours. No results for input(s): "LIPASE", "AMYLASE" in the last 72 hours. CBC: Recent Labs    10/11/22 0435 10/12/22 0428  WBC 18.6* 17.1*  NEUTROABS 14.2* 12.7*  HGB 8.4* 8.3*  HCT 26.7* 26.0*  MCV 78.8* 77.8*  PLT 459* 450*   Cardiac Enzymes: No results for input(s): "CKTOTAL", "CKMB", "CKMBINDEX", "TROPONINI" in the last 72 hours. BNP: Invalid input(s): "POCBNP" D-Dimer: No results for  input(s): "DDIMER" in the last 72 hours. Hemoglobin A1C: No results for input(s): "HGBA1C" in the last 72 hours. Fasting Lipid Panel: No results for input(s): "CHOL", "HDL", "LDLCALC", "TRIG", "CHOLHDL", "LDLDIRECT" in the last 72 hours. Thyroid Function Tests: No results for input(s): "TSH", "T4TOTAL", "T3FREE", "THYROIDAB" in the last 72 hours.  Invalid input(s): "FREET3" Anemia Panel: No results for input(s): "VITAMINB12", "FOLATE", "FERRITIN", "TIBC", "IRON", "RETICCTPCT" in the last 72 hours.  No results found.   Echo preserved ventricular function ejection fraction 60 to 65%  TELEMETRY: Normal sinus rhythm 75 nonspecific finding:  ASSESSMENT AND PLAN:  Principal Problem:   Adenocarcinoma of colon ([place]) Active Problems:   Diabetes ([place])   Essential hypertension   Paroxysmal A-fib (HCC)   S/P right colectomy   S/P partial resection of colon   Hyponatremia   AKI (acute kidney injury) ([place])   Postoperative intra-abdominal abscess   Persistent atrial fibrillation (HCC)   Septic shock (HCC)   Diabetic ketoacidosis without coma associated with type 2 diabetes mellitus (HCC)   Generalized anxiety disorder   Acute hypoxic respiratory failure (HCC)   Weakness   Microcytic anemia   Intra-abdominal abscess (HCC)    Plan Atrial fibrillation paroxysmal currently on amiodarone metoprolol.  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  Leukocytosis on Zosyn meropenem Generalized weakness requiring physical therapy Acute hypoxic respiratory failure Breathing on his own not hypoxic continue current therapy Continue diabetes management and control   Yolonda Kida, MD 10/12/2022 7:27 AM

## 2022-10-12 NOTE — Progress Notes (Addendum)
10/11/22 1900 patient alert able to make all needs known call light in reach pt watching television  2000 patient repositioned help tylenol MAR had a alarm that patient would be overdosing on recommended amounts of tylenol if given patient stated he's Not having any pain.Safety set up to PICC line and PICC line dressing changed 0000 patient refusing mobility 0200 repositioned  0400 wound care completed 0449 called report for patient to go to 237 waiting on bed 0614 Called wife to inform her of room change when bed is available patient is aware

## 2022-10-12 NOTE — Plan of Care (Signed)
  Problem: Education: Goal: Ability to describe self-care measures that may prevent or decrease complications (Diabetes Survival Skills Education) will improve Outcome: Not Progressing   Problem: Coping: Goal: Ability to adjust to condition or change in health will improve Outcome: Not Progressing   Problem: Fluid Volume: Goal: Ability to maintain a balanced intake and output will improve Outcome: Not Progressing   Problem: Health Behavior/Discharge Planning: Goal: Ability to identify and utilize available resources and services will improve Outcome: Not Progressing Goal: Ability to manage health-related needs will improve Outcome: Not Progressing   Problem: Metabolic: Goal: Ability to maintain appropriate glucose levels will improve Outcome: Not Progressing   Problem: Nutritional: Goal: Maintenance of adequate nutrition will improve Outcome: Not Progressing Goal: Progress toward achieving an optimal weight will improve Outcome: Not Progressing   Problem: Skin Integrity: Goal: Risk for impaired skin integrity will decrease Outcome: Not Progressing   Problem: Tissue Perfusion: Goal: Adequacy of tissue perfusion will improve Outcome: Not Progressing   Problem: Education: Goal: Knowledge of General Education information will improve Description: Including pain rating scale, medication(s)/side effects and non-pharmacologic comfort measures Outcome: Not Progressing   Problem: Health Behavior/Discharge Planning: Goal: Ability to manage health-related needs will improve Outcome: Not Progressing   Problem: Clinical Measurements: Goal: Ability to maintain clinical measurements within normal limits will improve Outcome: Not Progressing Goal: Will remain free from infection Outcome: Not Progressing Goal: Diagnostic test results will improve Outcome: Not Progressing Goal: Respiratory complications will improve Outcome: Not Progressing Goal: Cardiovascular complication will  be avoided Outcome: Not Progressing   Problem: Activity: Goal: Risk for activity intolerance will decrease Outcome: Not Progressing   Problem: Nutrition: Goal: Adequate nutrition will be maintained Outcome: Not Progressing   Problem: Coping: Goal: Level of anxiety will decrease Outcome: Not Progressing   Problem: Elimination: Goal: Will not experience complications related to bowel motility Outcome: Not Progressing Goal: Will not experience complications related to urinary retention Outcome: Not Progressing   Problem: Pain Managment: Goal: General experience of comfort will improve Outcome: Not Progressing   Problem: Safety: Goal: Ability to remain free from injury will improve Outcome: Not Progressing

## 2022-10-12 NOTE — TOC Progression Note (Signed)
Transition of Care [facility]) - Progression Note    Patient Details  Name: Nicolas Zuniga MRN: [id number] Date of Birth: 1956/03/29  [facility] CM/SW Contact  Shelbie Hutching, RN Phone Number: 10/12/2022, 9:29 AM  Clinical Narrative:    Reached out to PT and OT to see about working with patient today, MD thinks that patient will be ready for discharge to acute inpatient rehab soon.  CIR will cont to follow.  Once therapy notes updated, TOC team can send updated notes to [place] rehab as well.    Expected Discharge Plan: [place] Barriers to Discharge: Continued Medical Work up  Expected Discharge Plan and Services Expected Discharge Plan: [place]   Discharge Planning Services: CM Consult Post Acute Care Choice: [place] Living arrangements for the past 2 months: Single Family Home                                       Social Determinants of Health (SDOH) Interventions    Readmission Risk Interventions    09/30/2022    5:08 PM  Readmission Risk Prevention Plan  Transportation Screening Complete  PCP or Specialist Appt within 3-5 Days Complete  HRI or [place] Complete  Social Work Consult for [place] Planning/Counseling Complete  Palliative Care Screening Not Applicable  Medication Review [employer]) Complete

## 2022-10-12 NOTE — Consult Note (Signed)
[place] for Electrolyte Monitoring and Replacement   Recent Labs: Potassium (mmol/L)  Date Value  10/12/2022 4.3  04/05/2014 4.4   Magnesium (mg/dL)  Date Value  10/11/2022 2.0  06/19/2012 1.8   Calcium (mg/dL)  Date Value  10/12/2022 8.3 (L)   Calcium, Total (mg/dL)  Date Value  04/05/2014 9.7   Albumin (g/dL)  Date Value  10/06/2022 2.2 (L)  04/05/2014 4.0   Phosphorus (mg/dL)  Date Value  10/11/2022 3.3  06/16/2012 2.7   Sodium (mmol/L)  Date Value  10/12/2022 135  04/05/2014 136   Assessment: Patient is a 66 y/o M with medical history including adenocarcinoma of the colon s/p right colectomy & ileocolic anastomoses and cholecystectomy on 10/19 followed by ExLap with abdominal washout, drain placement, and diverting loop ileostomy on 10/23, DM, HTN, Afib s/p multiple ablations. Pharmacy consulted to assist with electrolyte monitoring and replacement as indicated.  Nutrition: NG tube in place on tube feeds and advancing diet per surgery (FLD + nutritional supplements) Diuresis: Intermittent IV Lasix  Medications are being administered per tube as of 11/3  Goal of Therapy:  K > 4  Mg > 2 All other electrolytes within normal limits  Plan:  --Electrolytes stable and not requiring replacement --Will discontinue electrolyte consult at this time. Defer ordering of labs and electrolyte replacement to primary team --Pharmacy will follow along peripherally  Benita Gutter 10/12/2022 7:55 AM

## 2022-10-12 NOTE — Progress Notes (Signed)
Inpatient Rehab Admissions Coordinator:   Following patient for potential CIR admission. At this point, patient is not showing the ability to tolerate 3 hours of therapy.  Will continue to follow as he progresses. Will likely not be ready for CIR until the end of the week at best due to poor tolerance.   Rehab Admissons Coordinator [place], [place], [place] [phone]

## 2022-10-12 NOTE — Consult Note (Signed)
ANTICOAGULATION CONSULT NOTE -   Pharmacy Consult for heparin infusion Indication: atrial fibrillation  No Known Allergies  Patient Measurements: Height: '6\' 2"'$  (188 cm) Weight: 123.6 kg (272 lb 7.8 oz) IBW/kg (Calculated) : 82.2 Heparin Dosing Weight: 107.3 kg  Vital Signs: Temp: 98.2 F (36.8 C) (11/05 1936) Temp Source: Oral (11/05 1936) BP: 119/54 (11/06 0400) Pulse Rate: 65 (11/06 0400)  Labs: Recent Labs    10/09/22 1524 10/09/22 1759 10/09/22 1759 10/10/22 0340 10/10/22 1626 10/11/22 0107 10/11/22 0435 10/11/22 0819 10/12/22 0428  HGB  --  9.1*   < > 9.0*  --   --  8.4*  --  8.3*  HCT  --  28.7*   < > 28.7*  --   --  26.7*  --  26.0*  PLT  --   --   --  501*  --   --  459*  --  450*  HEPARINUNFRC <0.10*  --   --   --    < > 0.42  --  0.48 0.43  CREATININE  --   --   --  1.31*  --   --  1.15  --  1.14  TROPONINIHS 24* 27*  --   --   --   --   --   --   --    < > = values in this interval not displayed.     Estimated Creatinine Clearance: 89.1 mL/min (by C-G formula based on SCr of 1.14 mg/dL).   Medical History: Past Medical History:  Diagnosis Date   Adenocarcinoma of colon ([place]) 09/08/2022   Cholelithiasis    Chronic gastritis    Colon polyps    Coronary artery calcification seen on CT scan    Diastolic dysfunction    a.) TTE 04/09/2022: EF >55%, triv TR, G1DD   Diverticulosis    Hypertension    Long term current use of anticoagulant    a.) apixaban   Nephrolithiasis    Paroxysmal A-fib ([place])    a.) CHA2DS2VASc = 3 (age, HTN, T2DM);  b.) s/p DCCV 07/09/2016 (100 J x1), 12/22/2018 (120 J x1), 08/18/2021 (120 J x1), 03/11/2022 (120 J x1), 03/31/2022 (120 J x1); c.) rate/rhythm maintained on oral flecanide + metoprolol succinate; chronically anticoagulated with apixaban   T2DM (type 2 diabetes mellitus) ([place])   Heparin Dosing Weight: 107.3 kg  Medications:  Heparin drip at 2500 units/hr stopped 09/08/22 @ 1437  Assessment: Nicolas Zuniga is a 66  y.o. male presenting for laparoscopic R colectomy and cholecystectomy. PMH significant for recently diagnosed adenocarcinoma of the colon, DM, HTN, Afib on metoprolol, flecainide, and apixaban. Patient was on [facility] PTA per chart review, last dose of apixaban PTA reported to be on 10/16. Pharmacy has been consulted to initiate and manage heparin infusion. Now s/p procedure.   11/3 Hgb dropped to 6 and patient received blood transfusion.  Heparin drip stopped. Pharmacy consulted to resume heparin drip to be restarted today (11/4).  Per MD no bolus.   Date Time aPTT/HL Rate/Comment 11/04 1626 0.25  Subtherapeutic; 2500 > 2600 un/hr 11/05 0107 0.42  Therapeutic x 1  11/5 0819 0.48  Therapeutic x2 11/6 0428 0.43  Therapeutic x 3   Goal of Therapy:  Heparin level 0.3-0.5 units/ml Monitor platelets by anticoagulation protocol: Yes   Plan:  No bolus protocol Continue heparin infusion at 2600 units/hr (~1 un/k/h) F/u Heparin level with am labs Continue to monitor H&H and platelets Daily CBC while on heparin drip  Renda Rolls, PharmD, [facility] 10/12/2022 6:19 AM

## 2022-10-12 NOTE — Progress Notes (Signed)
Date of Admission:  09/24/2022      ID: Nicolas Zuniga is a 66 y.o. male Principal Problem:   Adenocarcinoma of colon ([place]) Active Problems:   Diabetes ([place])   Essential hypertension   Paroxysmal A-fib (HCC)   S/P right colectomy   S/P partial resection of colon   Hyponatremia   AKI (acute kidney injury) ([place])   Postoperative intra-abdominal abscess   Persistent atrial fibrillation (HCC)   Septic shock (HCC)   Diabetic ketoacidosis without coma associated with type 2 diabetes mellitus (HCC)   Generalized anxiety disorder   Acute hypoxic respiratory failure (HCC)   Weakness   Microcytic anemia   Intra-abdominal abscess ([place])  Nicolas Zuniga is a 66 y.o. male with a history of HTN, Paroxysmal afib on eliquis, DM, adeno carcinoma of colon Was admitted for elective rt hemicolectomy on 09/24/22. The attempted lap colectomy was converted to laparotomy. HE also underwent cholecystectomy for chronic cholecystitis He started having increasing  cr, wbc and repeat cts were done' He was taken back for surgery on 09/28/22 and underwent exp laparotomy, abdominal washout, blake drain placement and diverting loop ileostomy . There was no leak identified. Midline laparotomy was allowed to heal by secondary intention Pt had a foley 09/28/22 and PICC placed on 09/27/22 Started on zosyn since 09/27/22 He was in ICU and remained intubated until 09/30/22 - weaned off pressors Because of worsening WBC he had repeat CT on 10/28 which showed multiple collections in the abdomen Meropenem was started and zosyn Dc intervention radiology placed a drain on the rt upper quadrant collection and aspirated 2 more collections and sent all three for culture on 10/05/22 micafunginwas also added Candida albicans in the culture from the posterior to colon collection Ecause he is on amiodarone - fluconazole could nt be started instead micafungin After the procedure  patient was back to ICU on 10/05/22 on the same day  with shallow breathing hypercapnia and was intubated for 24 hrs- . After extubation he was sent to the floor On Friday 11/3 he was again lethargic and hypoxemic , Afib and transferred  Back to step down . Hb was 6- He had a repeat Ct abdomen on 11/3 which ruled out retroperitoneal hematoma. It shows multiple collections in the abdomen   Subjective:  Pt is feeling better More alert and says he ate soft food    Medications:   acetaminophen  1,000 mg Oral Q6H   amiodarone  200 mg Oral Daily   apixaban  5 mg Oral BID   Chlorhexidine Gluconate Cloth  6 each Topical Daily   diphenhydrAMINE  25 mg Oral QHS   insulin aspart  0-5 Units Subcutaneous QHS   insulin aspart  0-9 Units Subcutaneous TID WC   insulin detemir  8 Units Subcutaneous Daily   loperamide  2 mg Oral BID   metoprolol tartrate  50 mg Oral BID   pantoprazole  40 mg Oral Daily   sodium chloride flush  10-40 mL Intracatheter Q12H   sodium chloride flush  5 mL Intracatheter Q8H    Objective: Vital signs in last 24 hours: Patient Vitals for the past 24 hrs:  BP Temp Temp src Pulse Resp SpO2  10/12/22 0800 (!) 114/53 97.6 F (36.4 C) Oral 67 (!) 24 97 %  10/12/22 0400 (!) 119/54 -- -- 65 (!) 22 92 %  10/12/22 0000 122/61 -- -- 75 (!) 30 95 %  10/11/22 2300 125/62 -- -- 80 (!) 29 96 %  10/11/22 2200 (!) 132/59 -- -- 80 (!) 30 96 %  10/11/22 2100 133/68 -- -- 77 (!) 24 95 %  10/11/22 2000 (!) 121/52 -- -- 75 (!) 29 95 %  10/11/22 1936 -- 98.2 F (36.8 C) Oral -- -- --  10/11/22 1900 (!) 114/57 -- -- 79 (!) 28 95 %  10/11/22 1800 127/66 -- -- 75 (!) 26 95 %  10/11/22 1700 126/62 -- -- 66 (!) 26 96 %  10/11/22 1600 119/72 98.1 F (36.7 C) Oral 68 (!) 30 93 %  10/11/22 1500 125/70 -- -- 64 (!) 23 94 %  10/11/22 1445 130/69 -- -- -- -- --  10/11/22 1400 -- -- -- 63 (!) 25 92 %  10/11/22 1300 -- -- -- 65 (!) 24 93 %  10/11/22 1200 (!) 119/57 -- -- 67 (!) 24 94 %  10/11/22 1100 100/62 -- -- 65 (!) 24 92 %    PHYSICAL  EXAM:  General:  Awake and alert NG tube Lungs: b/l air entry Heart: s1s2-  Abdomen: surgical dressing over the abdominal surgical wound- rt upper quadrant drain 'ileostomy JP drain left side Extremities: atraumatic, no cyanosis. No edema. No clubbing Skin: No rashes or lesions. Or bruising Lymph: Cervical, supraclavicular normal. Neurologic: Grossly non-focal Rt PICC line  Lab Results wBC  Recent Labs    10/11/22 0435 10/12/22 0428  WBC 18.6* 17.1*  HGB 8.4* 8.3*  HCT 26.7* 26.0*  NA 137 135  K 4.6 4.3  CL 108 106  CO2 24 23  BUN 30* 27*  CREATININE 1.15 1.14   Microbiology: 09/29/22 Sputum culture serratia 10/05/22 Abdominal fluid X3 pending Rt perihepatic - No gorwth so far Left anteriro abdominal - NG Left post abdominal- yeast  Studies/Results: No results found.   Assessment/Plan: Malignancy of the rt colon- s/p hemicolectomy and acholecystectomy on 09/24/22  Intraabdominal abscesses   Exp lap on 09/28/22- no leak identified- but purulent fluid On 10/05/22 rt upper quadrant and 2 other collections aspirated- sent for culture Candida albicans in the post colon culture Continue meropenem Micafungin- as pt on amiodarone cannot switch to fluconazole( risk for QT prolongation) 10/09/22 repeat CT- persistent collections Continue IV meropenem and micafungin until 10/19/22   Leucocytosis  plateud around 17   Acute hypoxic resp failure with hypercapnia - r/o medication effect/ aspiration Pt was intubated,  now extubated As patient now is lethargic and shallow breathing recommend checking ABG   Acute encephalopathy- likely due to the above, co2 narcosis-- improved   AFIB with RVR-intermittent   Anemia- worsening- as patient on heparin to r/o bleed   AKI- resolved  Hypokalemia  Discussed the management with  wife and  care team  Dr.Andrew Juleen China will be covering from tomorrow. Call if needed   OPAT orders Diagnosis: Intra abdominal  abscesses Baseline Creatinine 1.14  Culture Result: candida albicans intraabdominal fluid culture  No Known Allergies  Discharge antibiotics: Meropenem 1 gram IV every  8 hours Micafungin '100mg'$  IV every 24 hours  For 2 weeks  End Date: 10/19/22  [facility] Care Per Protocol:  Labs weekly while on IV antibiotics: _X_ CBC with differential  _X_ CMP   _X_ Please pull PIC at completion of IV antibiotics   Fax weekly lab results  promptly to [phone]  Clinic Follow Up Appt: with Dr.Pabon surgeon Follow up with ID if needed- can call the number below to schedule an appt  Call [phone] with any questions or concerns

## 2022-10-12 NOTE — Evaluation (Signed)
Physical Therapy Re-Evaluation Patient Details Name: Nicolas Zuniga MRN: [id number] DOB: 1956/04/05 Today's Date: 10/12/2022  History of Present Illness  Pt is a 66 y.o. male s/p exploratory laparotomy, abdominal washout, and diverting loop ileostomy for intra-abdominal purulent fluid after initial open right colectomy with open cholecystectomy for right colon cancer and cholelithiasis on 10/19, RUQ drain placed recently then intubated, extubated 10/31. MD assessment includes: Adenocarcinoma of the colon, septic shock, A-fib, AKI, generalized anxiety disorder, anemia, weakness, acute hypoxic respiratory failure, and hyponatremia.   Clinical Impression  Pt was pleasant and motivated to participate during the session and put forth good effort throughout.  Pt required physical assistance to manage his LE's and trunk during log roll training but once in sitting required no physical assistance to come to standing or to ambulate.  Pt was able to amb 10 feet prior to fatiguing and needing to return to sitting with SpO2 and HR WNL on room air.  Pt reported no adverse symptoms during the session other than R-sided abdominal pain.  Pt will benefit from PT services in an IR setting upon discharge to safely address deficits listed in patient problem list for decreased caregiver assistance and eventual return to PLOF.         Recommendations for follow up therapy are one component of a multi-disciplinary discharge planning process, led by the attending physician.  Recommendations may be updated based on patient status, additional functional criteria and insurance authorization.  Follow Up Recommendations Acute inpatient rehab (3hours/day)      Assistance Recommended at Discharge Frequent or constant Supervision/Assistance  Patient can return home with the following  A lot of help with walking and/or transfers;A lot of help with bathing/dressing/bathroom;Help with stairs or ramp for entrance;Direct  supervision/assist for medications management;Assist for transportation    Equipment Recommendations Rolling walker (2 wheels)  Recommendations for Other Services       Functional Status Assessment Patient has had a recent decline in their functional status and demonstrates the ability to make significant improvements in function in a reasonable and predictable amount of time.     Precautions / Restrictions Precautions Precautions: Fall Restrictions Weight Bearing Restrictions: No Other Position/Activity Restrictions: Multiple JP drains and ostormy bag      Mobility  Bed Mobility Overal bed mobility: Needs Assistance Bed Mobility: Rolling, Sidelying to Sit Rolling: Min assist Sidelying to sit: Mod assist       General bed mobility comments: Log roll training with mod A for BLE and trunk control    Transfers Overall transfer level: Needs assistance Equipment used: Rolling walker (2 wheels) Transfers: Sit to/from Stand Sit to Stand: From elevated surface, Min guard           General transfer comment: Min verbal cues for sequencing    Ambulation/Gait Ambulation/Gait assistance: Min guard Gait Distance (Feet): 10 Feet Assistive device: Rolling walker (2 wheels) Gait Pattern/deviations: Step-to pattern, Decreased step length - right, Decreased step length - left, Trunk flexed Gait velocity: decreased     General Gait Details: Slow cadence with short B step length but steady without LOB  Stairs            Wheelchair Mobility    Modified Rankin (Stroke Patients Only)       Balance Overall balance assessment: Needs assistance Sitting-balance support: Feet supported Sitting balance-Leahy Scale: Good     Standing balance support: Bilateral upper extremity supported, During functional activity, Reliant on assistive device for balance Standing balance-Leahy Scale: Fair  Pertinent Vitals/Pain Pain  Assessment Pain Assessment: 0-10 Pain Score: 2  Pain Location: abdomen, right side Pain Descriptors / Indicators: Discomfort, Operative site guarding, Sore Pain Intervention(s): Premedicated before session, Monitored during session, Repositioned, Patient requesting pain meds-RN notified, RN gave pain meds during session    Home Living Family/patient expects to be discharged to:: Private residence Living Arrangements: Spouse/significant other Available Help at Discharge: Family;Available 24 hours/day Type of Home: House Home Access: Stairs to enter Entrance Stairs-Rails: Right Entrance Stairs-Number of Steps: 3   Home Layout: One level Home Equipment: None      Prior Function Prior Level of Function : Independent/Modified Independent;Working/employed             Mobility Comments: Ind amb without an AD community distances ADLs Comments: Ind with ADLs     Hand Dominance        Extremity/Trunk Assessment   Upper Extremity Assessment Upper Extremity Assessment: Generalized weakness    Lower Extremity Assessment Lower Extremity Assessment: Generalized weakness       Communication   Communication: No difficulties  Cognition Arousal/Alertness: Awake/alert Behavior During Therapy: WFL for tasks assessed/performed Overall Cognitive Status: Within Functional Limits for tasks assessed                                          General Comments      Exercises Total Joint Exercises Long Arc Quad: AROM, Strengthening, Both, 10 reps Knee Flexion: AROM, Strengthening, Both, 10 reps Marching in Standing: Strengthening, Both, 5 reps, Standing Other Exercises: Log roll training  Other Exercises: Pt education on benefits of OOB to chair Other Exercises: HEP review   Assessment/Plan    PT Assessment Patient needs continued PT services  PT Problem List Decreased strength;Decreased mobility;Pain;Decreased activity tolerance;Decreased knowledge of use of  DME;Decreased balance       PT Treatment Interventions Gait training;DME instruction;Therapeutic exercise;Functional mobility training;Therapeutic activities;Patient/family education;Stair training    PT Goals (Current goals can be found in the Care Plan section)  Acute Rehab PT Goals Patient Stated Goal: To get stronger PT Goal Formulation: With patient/family Time For Goal Achievement: 10/25/22 Potential to Achieve Goals: Good    Frequency Min 2X/week     Co-evaluation               AM-PAC PT "6 Clicks" Mobility  Outcome Measure Help needed turning from your back to your side while in a flat bed without using bedrails?: A Little Help needed moving from lying on your back to sitting on the side of a flat bed without using bedrails?: A Lot Help needed moving to and from a bed to a chair (including a wheelchair)?: A Lot Help needed standing up from a chair using your arms (e.g., wheelchair or bedside chair)?: A Little Help needed to walk in hospital room?: A Little Help needed climbing 3-5 steps with a railing? : Total 6 Click Score: 14    End of Session Equipment Utilized During Treatment: Gait belt Activity Tolerance: Patient tolerated treatment well Patient left: in chair;with call bell/phone within reach;with chair alarm set;with family/visitor present;Other (comment) (Pt left with OT at end of session) Nurse Communication: Mobility status PT Visit Diagnosis: Muscle weakness (generalized) (M62.81);Pain;Difficulty in walking, not elsewhere classified (R26.2) Pain - Right/Left: Right Pain - part of body:  (abdomen)    Time: 5631-4970 PT Time Calculation (min) (ACUTE ONLY): 42 min  Charges:   PT Evaluation $PT Re-evaluation: 1 Re-eval PT Treatments $Therapeutic Activity: 8-22 mins        D. Scott Taqwa Deem PT, DPT 10/12/22, 11:51 AM

## 2022-10-12 NOTE — TOC Progression Note (Signed)
Transition of Care [facility]) - Progression Note    Patient Details  Name: [name] MRN: [id number] Date of Birth: 10/16/1956  Transition of Care [facility]) CM/SW [place], LCSW Phone Number: 10/12/2022, 2:57 PM  Clinical Narrative:  Faxed updated therapy notes to [facility].   Expected Discharge Plan: [place] Barriers to Discharge: Continued Medical Work up  Expected Discharge Plan and Services Expected Discharge Plan: [place]   Discharge Planning Services: CM Consult Post Acute Care Choice: [place] Living arrangements for the past 2 months: Single Family Home                                       Social Determinants of Health (SDOH) Interventions    Readmission Risk Interventions    09/30/2022    5:08 PM  Readmission Risk Prevention Plan  Transportation Screening Complete  PCP or Specialist Appt within 3-5 Days Complete  HRI or [place] Complete  Social Work Consult for [place] Planning/Counseling Complete  Palliative Care Screening Not Applicable  Medication Review Press photographer) Complete

## 2022-10-13 DIAGNOSIS — C189 Malignant neoplasm of colon, unspecified: Secondary | ICD-10-CM | POA: Diagnosis not present

## 2022-10-13 LAB — GLUCOSE, CAPILLARY
Glucose-Capillary: 157 mg/dL — ABNORMAL HIGH (ref 70–99)
Glucose-Capillary: 197 mg/dL — ABNORMAL HIGH (ref 70–99)
Glucose-Capillary: 183 mg/dL — ABNORMAL HIGH (ref 70–99)
Glucose-Capillary: 150 mg/dL — ABNORMAL HIGH (ref 70–99)

## 2022-10-13 LAB — CBC WITH DIFFERENTIAL/PLATELET
Abs Immature Granulocytes: 0.15 10*3/uL — ABNORMAL HIGH (ref 0.00–0.07)
Basophils Absolute: 0.1 10*3/uL (ref 0.0–0.1)
Basophils Relative: 1 %
Eosinophils Absolute: 0.2 10*3/uL (ref 0.0–0.5)
Eosinophils Relative: 1 %
HCT: 26.9 % — ABNORMAL LOW (ref 39.0–52.0)
Hemoglobin: 8.4 g/dL — ABNORMAL LOW (ref 13.0–17.0)
Immature Granulocytes: 1 %
Lymphocytes Relative: 11 %
Lymphs Abs: 1.6 10*3/uL (ref 0.7–4.0)
MCH: 24.4 pg — ABNORMAL LOW (ref 26.0–34.0)
MCHC: 31.2 g/dL (ref 30.0–36.0)
MCV: 78.2 fL — ABNORMAL LOW (ref 80.0–100.0)
Monocytes Absolute: 1.4 10*3/uL — ABNORMAL HIGH (ref 0.1–1.0)
Monocytes Relative: 9 %
Neutro Abs: 11.3 10*3/uL — ABNORMAL HIGH (ref 1.7–7.7)
Neutrophils Relative %: 77 %
Platelets: 450 10*3/uL — ABNORMAL HIGH (ref 150–400)
RBC: 3.44 MIL/uL — ABNORMAL LOW (ref 4.22–5.81)
RDW: 18.2 % — ABNORMAL HIGH (ref 11.5–15.5)
WBC: 14.6 10*3/uL — ABNORMAL HIGH (ref 4.0–10.5)
nRBC: 0 % (ref 0.0–0.2)

## 2022-10-13 NOTE — Progress Notes (Signed)
Inpatient Rehab Admissions Coordinator:   Spoke with patient's wife, [name]. She is interested in [facility] due to proximity to home. If they do not offer bed, she is still interested in AIR here at [facility]. We will continue to follow for potential AIR admission when patient medically ready and able to tolerate more therapy.   Rehab Admissons Coordinator [place], [place], [place] [phone]

## 2022-10-13 NOTE — Progress Notes (Signed)
Physical Therapy Treatment Patient Details Name: Nicolas Zuniga MRN: [id number] DOB: 31-Jul-1956 Today's Date: 10/13/2022   History of Present Illness Pt is a 66 y.o. male s/p exploratory laparotomy, abdominal washout, and diverting loop ileostomy for intra-abdominal purulent fluid after initial open right colectomy with open cholecystectomy for right colon cancer and cholelithiasis on 10/19, RUQ drain placed recently then intubated, extubated 10/31. MD assessment includes: Adenocarcinoma of the colon, septic shock, A-fib, AKI, generalized anxiety disorder, anemia, weakness, acute hypoxic respiratory failure, and hyponatremia.    PT Comments    Pt was pleasant and motivated to participate during the session and put forth good effort throughout. Pt required mod A to come to standing from recliner with cues for sequencing and then min A to stand from the EOB from an elevated position.  Pt was able to amb 1 x 5' and then 1 x 3' after a 60 sec seated rest with HR and SpO2 WNL on room air.  Pt reported only mild pain this session with gait limited primarily by fatigue/poor activity tolerance.  Pt will benefit from PT services in an IR setting upon discharge to safely address deficits listed in patient problem list for decreased caregiver assistance and eventual return to PLOF.     Recommendations for follow up therapy are one component of a multi-disciplinary discharge planning process, led by the attending physician.  Recommendations may be updated based on patient status, additional functional criteria and insurance authorization.  Follow Up Recommendations  Acute inpatient rehab (3hours/day)     Assistance Recommended at Discharge Frequent or constant Supervision/Assistance  Patient can return home with the following A lot of help with walking and/or transfers;A lot of help with bathing/dressing/bathroom;Help with stairs or ramp for entrance;Direct supervision/assist for medications management;Assist  for transportation   Equipment Recommendations  Rolling walker (2 wheels)    Recommendations for Other Services       Precautions / Restrictions Precautions Precautions: Fall Restrictions Weight Bearing Restrictions: No Other Position/Activity Restrictions: Multiple JP drains and ostormy bag     Mobility  Bed Mobility Overal bed mobility: Needs Assistance         Sit to supine: Min assist   General bed mobility comments: Min A for BLE control with log roll training    Transfers Overall transfer level: Needs assistance Equipment used: Rolling walker (2 wheels) Transfers: Sit to/from Stand Sit to Stand: Mod assist, Min assist, From elevated surface           General transfer comment: Min verbal cues for hand placement with Mod A needed to come to standing from recliner and min A from elevated EOB    Ambulation/Gait Ambulation/Gait assistance: Min guard Gait Distance (Feet): 5 Feet x 1, 3 Feet x 1 Assistive device: Rolling walker (2 wheels) Gait Pattern/deviations: Step-to pattern, Decreased step length - right, Decreased step length - left, Trunk flexed Gait velocity: decreased     General Gait Details: Slow cadence with short B step length but steady without LOB   Stairs             Wheelchair Mobility    Modified Rankin (Stroke Patients Only)       Balance Overall balance assessment: Needs assistance Sitting-balance support: Feet supported Sitting balance-Leahy Scale: Good     Standing balance support: Bilateral upper extremity supported, During functional activity, Reliant on assistive device for balance Standing balance-Leahy Scale: Fair  Cognition Arousal/Alertness: Awake/alert Behavior During Therapy: WFL for tasks assessed/performed Overall Cognitive Status: Within Functional Limits for tasks assessed                                          Exercises Total Joint  Exercises Ankle Circles/Pumps: Strengthening, Both, 10 reps (with manual resistance) Quad Sets: Strengthening, Both, 10 reps Gluteal Sets: Strengthening, Both, 10 reps Long Arc Quad: AROM, Strengthening, Both, 10 reps Knee Flexion: AROM, Strengthening, Both, 10 reps Other Exercises Other Exercises: HEP review for BLE APs, QS, GS, and LAQs x 10 each every 1-2 hours daily    General Comments        Pertinent Vitals/Pain Pain Assessment Pain Assessment: 0-10 Pain Score: 2  Pain Location: abdomen, right side Pain Descriptors / Indicators: Discomfort, Operative site guarding, Sore Pain Intervention(s): Premedicated before session, Monitored during session, Repositioned    Home Living                          Prior Function            PT Goals (current goals can now be found in the care plan section) Progress towards PT goals: Not progressing toward goals - comment (limited by functional weakness and poor activity tolerance)    Frequency    Min 2X/week      PT Plan Current plan remains appropriate    Co-evaluation              AM-PAC PT "6 Clicks" Mobility   Outcome Measure  Help needed turning from your back to your side while in a flat bed without using bedrails?: A Little Help needed moving from lying on your back to sitting on the side of a flat bed without using bedrails?: A Lot Help needed moving to and from a bed to a chair (including a wheelchair)?: A Lot Help needed standing up from a chair using your arms (e.g., wheelchair or bedside chair)?: A Lot Help needed to walk in hospital room?: A Lot Help needed climbing 3-5 steps with a railing? : Total 6 Click Score: 12    End of Session Equipment Utilized During Treatment: Gait belt Activity Tolerance: Patient tolerated treatment well Patient left: in bed;with call bell/phone within reach;with bed alarm set Nurse Communication: Mobility status PT Visit Diagnosis: Muscle weakness (generalized)  (M62.81);Pain;Difficulty in walking, not elsewhere classified (R26.2) Pain - Right/Left: Right     Time: 5102-5852 PT Time Calculation (min) (ACUTE ONLY): 24 min  Charges:  $Therapeutic Exercise: 8-22 mins $Therapeutic Activity: 8-22 mins                     D. Scott Raileigh Sabater PT, DPT 10/13/22, 3:21 PM

## 2022-10-13 NOTE — Progress Notes (Signed)
Calorie Count Note  48 hour calorie count ordered.  Diet: soft Supplements: Magic cup TID with meals, each supplement provides 290 kcal and 9 grams of protein; 30 ml Prosource Plus TID; Ensure Enlive po TID, each supplement provides 350 kcal and 20 grams of protein  Case discussed with MD; plan to remove NGT tomorrow. Per TOC notes, pt will discharge to CIR once bed is available.   Spoke with pt and wife at bedside. Pt is pleasant in good spirits today and states his appetite is improving. Pt consumed bacon, fruit, and an Ensure supplement this morning. He shares that he is still a little hesitant to eat. Per pt and wife, they do not feel like yesterday is a good reflection of his oral intake, as he was often interrupted at meals for therapy and room transfers. Pt shares he only ate a few bites of omelette yesterday because it got cold from room transfer and working with pt.   RD obtained lunch order from pt. Discussed importance of good meal and supplement intake to promote healing. Pt amenable to continue supplement.   11/7 Breakfast: 170 kcals, 5 grams protein Lunch: 225 kcals. 4 grams protein Dinner: 15 kcals, 2 grams protein Supplements: 1 Ensure Enlive, 2 Prosource Plus  Total intake: 860 kcal (34% of minimum estimated needs)  61 protein (41% of minimum estimated needs)  Breakfast: 401 kcals, 25 grams protein Lunch: - Dinner: - Supplements: 1 Ensure Enlive, 1 Prosource Plus  Total intake: 851 kcal (33% of minimum estimated needs)  60 grams protein (40% of minimum estimated needs)  Nutrition Dx:  Increased nutrient needs related to post-op healing as evidenced by estimated needs; ongoing  Goal: Patient will meet greater than or equal to 90% of their needs; progressing   Intervention:   -48 hour calorie count per MD -MVI with minerals daily -Continue Magic cup TID with meals, each supplement provides 290 kcal and 9 grams of protein  -Ensure Enlive po TID, each supplement  provides 350 kcal and 20 grams of protein -30 ml Prosource Plus TID, each supplement provides 100 kcals and 15 grams protein  Loistine Chance, RD, LDN, [place] Registered Dietitian II Certified Diabetes Care and Education Specialist Please refer to [facility] for RD and/or RD on-call/weekend/after hours pager

## 2022-10-13 NOTE — Progress Notes (Signed)
PHARMACY CONSULT NOTE FOR:  OUTPATIENT  PARENTERAL ANTIBIOTIC THERAPY (OPAT)  Indication: Intra-abdominal abscesses Regimen: Meropenem 1 g IV q8H and micafungin 100 mg IV q24H End date: 10/19/2022  IV antibiotic discharge orders are pended. To discharging provider:  please sign these orders via discharge navigator,  Select New Orders & click on the button choice - Manage This Unsigned Work.    Thank you for allowing pharmacy to be a part of this patient's care.  Dara Hoyer, PharmD PGY-1 Pharmacy Resident 10/13/2022 9:09 AM

## 2022-10-13 NOTE — Progress Notes (Signed)
[facility] Hospital Day(s): [facility] op day(s): 15 Days Post-Op.   Interval History:  Patient seen and examined Nothing acute overnight  HR remains controlled; regular on monitor; on PO Amiodarone/Metoprolol  Patient reports he is doing better; slow progress No abdominal pain, nausea, emesis  His leukocytosis has improved again this AM; 14.6K Hgb seems stable in last 48 hours; 8.4 Surgical drain with minimal output; serous Percutaneous drain (RUQ); unmeasured; serous Cx x3 from (10/30) are without bacteria; few candida albicans on left posterior Cx  He continues on Meropenem and micafungin; ID on-board He continues to have ostomy function; 325 ccs recorded  Enteric feedings held On soft diet; tolerating better  Vital signs in last 24 hours: [min-max] current  Temp:  [97.5 F (36.4 C)-98.2 F (36.8 C)] 98.1 F (36.7 C) (11/07 0727) Pulse Rate:  [68-77] 68 (11/07 0727) Resp:  [16-18] 18 (11/07 0727) BP: (121-131)/(61-66) 121/63 (11/07 0727) SpO2:  [95 %-98 %] 96 % (11/07 0727)     Height: '6\' 2"'$  (188 cm) Weight: 123.6 kg BMI (Calculated): 34.97   Intake/Output last 2 shifts:  11/06 0701 - 11/07 0700 In: 155.1 [I.V.:155.1] Out: 600 [Urine:600]   Physical Exam:  Constitutional: Alert, awake, NAD Respiratory: Breathing non-labor, no respiratory distress, on RA Cardiac: regular rate, regular rhythm  Gastrointestinal: Soft, no abdominal pain, non-distended, no rebound/guarding. Surgical drain with serosanguinous output. 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 Newly placed drain in RUQ; serous fluid Integumentary: Midline laparotomy healing via secondary intention, no erythema, no drainage, no evidence of fistula  Labs:     Latest Ref Rng & Units 10/13/2022    6:09 AM 10/12/2022    4:28 AM 10/11/2022    4:35 AM  CBC  WBC 4.0 - 10.5 K/uL 14.6  17.1  18.6   Hemoglobin 13.0 - 17.0 g/dL  8.4  8.3  8.4   Hematocrit 39.0 - 52.0 % 26.9  26.0  26.7   Platelets 150 - 400 K/uL 450  450  459       Latest Ref Rng & Units 10/12/2022    4:28 AM 10/11/2022    4:35 AM 10/10/2022    3:40 AM  CMP  Glucose 70 - 99 mg/dL 155  94  102   BUN 8 - 23 mg/dL '27  30  29   '$ Creatinine 0.61 - 1.24 mg/dL 1.14  1.15  1.31   Sodium 135 - 145 mmol/L 135  137  140   Potassium 3.5 - 5.1 mmol/L 4.3  4.6  4.7   Chloride 98 - 111 mmol/L 106  108  108   CO2 22 - 32 mmol/L '23  24  27   '$ Calcium 8.9 - 10.3 mg/dL 8.3  8.4  8.7      Imaging studies: No new pertinent imaging studies   Assessment/Plan:  66 y.o. male doing much better 15 Days Post-Op s/p exploratory laparotomy, abdominal washout, and diverting loop ileostomy for intra-abdominal purulent fluid after initial open right colectomy with open cholecystectomy for right colon cancer and cholelithiasis on 10/19    - Plan for follow up CT Abdomen/Pelvis tomorrow (11/08) to reassess abdomen/collections              - Continue soft diet + nutritional supplementation              - Okay to continue to hold enteric feedings given diet advancement.  I think we can leave NGT in for now to allow contrast administration for CT tomorrow. If CT looks good and diet continues to improve, we can remove NGT tomorrow (11/08).              - Monitor leukocytosis; improving             - Monitor H&H; stable             - Continue IV Abx (Meropenem); also on Micafungin; ID following             - Follow up 10/30 Cx; no growth to date             - Monitor abdominal examination - Monitor on-going ileostomy function; imodium started 11/02             - Midline wound care: Pack daily with saline moistened gauze, cover, secure             - Continue surgical drain; monitor and record output  - continue percutaneous drain (10/31); monitor and record output              - Pain control prn; antiemetics prn             - Appreciate cardiology assistance/recommendations   -  Oncology on board as well             - Therapies on board; recommending inpatient rehab             - Appreciate medicine assistance as well                - Discharge Planning; Making improvements, recommendations are still CIR. Will plan for repeat CT Abdomen/Pelvis tomorrow (11/08)  All of the above findings and recommendations were discussed with the patient, and the medical team, and all of patient's questions were answered to his expressed satisfaction.  -- Edison Simon, PA-C [facility] 10/13/2022, 8:08 AM M-F: 7am - 4pm

## 2022-10-13 NOTE — Progress Notes (Signed)
Occupational Therapy Treatment Patient Details Name: Nicolas Zuniga MRN: [id number] DOB: 04-08-1956 Today's Date: 10/13/2022   History of present illness Pt is a 66 y.o. male s/p exploratory laparotomy, abdominal washout, and diverting loop ileostomy for intra-abdominal purulent fluid after initial open right colectomy with open cholecystectomy for right colon cancer and cholelithiasis on 10/19, RUQ drain placed recently then intubated, extubated 10/31. MD assessment includes: Adenocarcinoma of the colon, septic shock, A-fib, AKI, generalized anxiety disorder, anemia, weakness, acute hypoxic respiratory failure, and hyponatremia.   OT comments  Pt received semi-reclined in bed. Appearing alert; much brighter affect than yesterday; willing to work with OT on transfer to chair. See flowsheet below for further details of session. Left seated in recliner chair, feet on floor, with all needs in reach.     Recommendations for follow up therapy are one component of a multi-disciplinary discharge planning process, led by the attending physician.  Recommendations may be updated based on patient status, additional functional criteria and insurance authorization.    Follow Up Recommendations  Acute inpatient rehab (3hours/day)    Assistance Recommended at Discharge Frequent or constant Supervision/Assistance  Patient can return home with the following  A lot of help with walking and/or transfers;A lot of help with bathing/dressing/bathroom;Assistance with cooking/housework;Direct supervision/assist for medications management;Direct supervision/assist for financial management;Assist for transportation;Help with stairs or ramp for entrance   Equipment Recommendations  Other (comment) (defer to next venue of care)    Recommendations for Other Services Rehab consult    Precautions / Restrictions Precautions Precautions: Fall Restrictions Weight Bearing Restrictions: No Other Position/Activity  Restrictions: Multiple JP drains and ostormy bag       Mobility Bed Mobility   Bed Mobility: Rolling, Sidelying to Sit Rolling: Min guard Sidelying to sit: Mod assist (for pushing trunk up)            Transfers Overall transfer level: Needs assistance Equipment used: Rolling walker (2 wheels) Transfers: Sit to/from Stand Sit to Stand: Min guard, From elevated surface                 Balance                                           ADL either performed or assessed with clinical judgement   ADL Overall ADL's : Needs assistance/impaired                     Lower Body Dressing: Maximal assistance (total assist for donning BIL socks today)                      Extremity/Trunk Assessment Upper Extremity Assessment Upper Extremity Assessment: Generalized weakness   Lower Extremity Assessment Lower Extremity Assessment: Defer to PT evaluation        Vision       Perception     Praxis      Cognition Arousal/Alertness: Awake/alert Behavior During Therapy: WFL for tasks assessed/performed Overall Cognitive Status: Within Functional Limits for tasks assessed                                          Exercises Other Exercises Other Exercises: OT reviewed BIL UE AROM exercises.    Shoulder Instructions  General Comments      Pertinent Vitals/ Pain       Pain Assessment Pain Assessment: 0-10 Pain Score: 1  Pain Location: abdomen, right side Pain Descriptors / Indicators: Discomfort, Operative site guarding, Sore Pain Intervention(s): Repositioned  Home Living                                          Prior Functioning/Environment              Frequency  Min 3X/week        Progress Toward Goals  OT Goals(current goals can now be found in the care plan section)  Progress towards OT goals: Progressing toward goals  Acute Rehab OT Goals Patient Stated Goal: get  better OT Goal Formulation: With patient/family Time For Goal Achievement: 10/21/22 Potential to Achieve Goals: Good ADL Goals Pt Will Perform Grooming: with min assist;standing Pt Will Perform Lower Body Dressing: with min assist;sit to/from stand;with caregiver independent in assisting Pt Will Transfer to Toilet: with min assist;ambulating;bedside commode  Plan Discharge plan remains appropriate;Frequency remains appropriate    Co-evaluation                 AM-PAC OT "6 Clicks" Daily Activity     Outcome Measure   Help from another person eating meals?: None Help from another person taking care of personal grooming?: A Little Help from another person toileting, which includes using toliet, bedpan, or urinal?: A Lot Help from another person bathing (including washing, rinsing, drying)?: A Lot Help from another person to put on and taking off regular upper body clothing?: A Little Help from another person to put on and taking off regular lower body clothing?: A Lot 6 Click Score: 16    End of Session Equipment Utilized During Treatment: Rolling walker (2 wheels)  OT Visit Diagnosis: Other abnormalities of gait and mobility (R26.89);Muscle weakness (generalized) (M62.81)   Activity Tolerance Patient tolerated treatment well   Patient Left in chair;with chair alarm set   Nurse Communication Mobility status        Time: 1330-1411 OT Time Calculation (min): 41 min  Charges: OT General Charges $OT Visit: 1 Visit OT Treatments $Therapeutic Activity: 38-52 mins  Waymon Amato, MS, OTR/L   Vania Rea 10/13/2022, 4:03 PM

## 2022-10-13 NOTE — Progress Notes (Signed)
[facility] [place] NAME: Nicolas Zuniga    MR#:  [id number]  DATE OF BIRTH:  09-14-1956  SUBJECTIVE:  seen earlier in the day. No family at bedside. Uneventful day yesterday and today so far. Tolerating soft diet No fever. Vitals stable. Patient communicating well awake and alert. Wife at bedside NG clapmed VITALS:  Blood pressure 121/63, pulse 68, temperature 98.1 F (36.7 C), temperature source Oral, resp. rate 18, height '6\' 2"'$  (1.88 m), weight 123.6 kg, SpO2 96 %.  PHYSICAL EXAMINATION:   GENERAL:  66 y.o.-year-old patient lying in the bed with no acute distress. Appears chronically ill NG+ LUNGS: decreased breath sounds bilaterally, no wheezing CARDIOVASCULAR: S1, S2 normal. No murmurs,   ABDOMEN: Soft, non-distended, no rebound/guarding. Surgical drain with serosanguinous output. Diverting loop colostomy in right abdomen Newly placed drain in RUQ; serous fluid EXTREMITIES: dependent edema b/l.   PICC + NEUROLOGIC: nonfocal  patient is alert and awake SKIN: No obvious rash, lesion, or ulcer.   LABORATORY PANEL:  CBC Recent Labs  Lab 10/13/22 0609  WBC 14.6*  HGB 8.4*  HCT 26.9*  PLT 450*     Chemistries  Recent Labs  Lab 10/11/22 0435 10/12/22 0428  NA 137 135  K 4.6 4.3  CL 108 106  CO2 24 23  GLUCOSE 94 155*  BUN 30* 27*  CREATININE 1.15 1.14  CALCIUM 8.4* 8.3*  MG 2.0  --      RADIOLOGY:  No results found.  Assessment and Plan  Nicolas Zuniga is a 66 y.o. male with hx of recently diagnosed adenocarcinoma of the colon who is currently admitted status post right colectomy, diabetes, hypertension, and A-fib status post multiple ablations currently on metoprolol and flecainide.  09/24/2022 - Underwent a laparoscopic right colectomy that was converted to open right colectomy along with a cholecystectomy - no significant complications and EBL was minimal at 80 cc.   10/20: Seen early morning by Dr. Dahlia Byes (general  surgery) and was doing well.  Starting around 6 PM he began to have hypotension 70/41 and the hospitalist service was consulted. Pt was given total of 25 g of albumin, 2 L of normal saline, and started on glucagon gtt concern for beta blocker toxicity. His metoprolol and flecainide were held. BP improved. Mild AKI on labs Cr 1.93 down form admission 1.23, likely hypovolemia. D/c glucagon. Strict I&O. Continue hold beta blocker and flecainide, may reduce dose metoprolol on discharge.  10/21: BP soft but improved 115/53, 117/64. Cr improved 1.66. Hgb stable.  Rapid response overnight, A-fib RVR.  Transferred to stepdown, amiodarone infusion, minimal response, diltiazem infusion.  Dr. Humphrey Rolls, cardiology, aware. Dr Dahlia Byes ordering CT chest/abdomen/pelvis, starting heparin drip. CT no PE, (+) Small bowel leak, certainly no contrast extravasation and seems contained --> plan repeat CT, hold off on surgical intervention unless emergent, starting abx.  10/23: Patient went for repeat CT noting increased extravasation concerning for anastomotic leak.  Patient taken back to the OR and well no leak found, underwent bowel follow-through and washout.  Postop, patient in ICU and still intubated.  ABG notes severe respiratory acidosis.  Note that should patient remain on ventilator, will have critical care assumed medical management in conjunction with surgery. 10/24 for worsening acute renal failure, nephrology consulted.  Patient with bradycardia overnight and Cardizem and Precedex discontinued. 10/25 extubated, off vasopressors, weaned off insulin drip to subcutaneous insulin 10/28.  CT scan showing.  Decreasing intraperitoneal free gas, free  fluid on the left liver similar.  Multiple fluid collections in the mesentery. 10/30 interventional radiology performed a IR drainage procedure.  Patient ordered a unit of packed red blood cells on a hemoglobin of 7.2.  I started Lasix for 2 doses. 10/31--remained in ICU was intubated  for 24 hours. 11/1--Extubated yday--Dobhoff feeding and ok to cont CLD. No bleeding. OK to start po eliquis (d/w dr Dahlia Byes). Will transfer out of ICU. Resume PT 11/2-- poor PO intake. Continuing G-tube. Resumed IV heparin drip due to drop in hemoglobin. Holding eliquis. 11/3-- hemoglobin drop down to 6.0. Given one unit blood transfusion ,patient is on IV heparin drip. Blood pressure soft. Went into sinus tach/a fib received IV metoprolol 5 mg times one. Heart rate and 90 to 100. Dr. Clayborn Bigness recommends hold off on IV amiodarone drip. Patient looks unwell/sick. Will move him to step down ICU. Discussed with Dr. Adora Fridge and agreeable. Dr. Janese Banks to see patient. Patient started on lomotil for increased ostomy output by surgery 11/4-- no overnight events. Patient remains in sinus rhythm. Feels much better. More awake and alert. Tolerating MG feeding. Resumed IV heparin drip after discussing with surgery. Repeat CT abdomen on11/3 shows decreased fluid collections. No retroperitoneal hematoma. Patient remains in sinus rhythm. Resume full liquid diet. 11/5-- remains hemodynamically stable. Tolerating full liquid diet will continue with Dr. today. Okay to resume PT. Orders placed. Vitals remains stable. Labs stable. Will transfer out to PCU 11/6-- patient remains hemodynamically stable. Surgery advanced to soft diet. Okay to switch to PO eliquis per surgery. Physical therapy to work with patient. Continue IV antibiotics. Will transfer out of ICU. Dietitian to do calorie count. 11/7-- NG tube clamped. Patient tolerating soft diet. Taking nutritional supplement. Plan is to get CT abdomen tomorrow per surgery. If remains stable then NG will be discontinued. IV antibiotics and antifungal to be continued till 10/19/22. Patient to go to CIR either at [facility] or [facility].     Assessment and Plan:  Adenocarcinoma of colon (HCC)/large neuroendocrine tumor Right colon cancer status post laparoscopic right colectomy and  cholecystectomy on 09/24/2022. Intra-abdominal infection status post exploratory laparotomy with abdominal washout and diverting loop ileostomy on 10/23 10/29 Meropenem started with persistent leukocytosis and Zosyn discontinued. 10/30 interventional radiology did a CT aspiration of left anterior abdominal fluid collection and placed a drain for the  perihepatic fluid collection. --pt on IV meropenem and Micafungin--per ID till 10/19/22 -- Dr. Janese Banks consulted   Septic shock [facility]) Septic shock has resolved.  Off pressors at this point.     Paroxysmal A-fib (HCC) Currently on amiodarone and metoprolol 11/1--started Eliquis (d/w surgery and [facility] cardiology) --11/2--back on IV heparin gtt --11/5--now on po eliquis  AKI (acute kidney injury) ([place]) Creatinine peaked at 4.61 on 09/30/2022 improved down to 1.1   Generalized anxiety disorder Cont lexapro   Microcytic anemia Hgb 8.4   Weakness Continue working with physical therapy   Acute hypoxic respiratory failure [facility]) Patient required intubation during the hospital course.   Weaned to RA   Hyponatremia Last sodium normal range--resolved   Diabetes (HCC) Type 2 diabetes mellitus on long-acting insulin  and ssi Last hemoglobin A1c 9.8.   Nutrition Status: Nutrition Problem: Increased nutrient needs Etiology: post-op healing Signs/Symptoms: estimated needs Interventions: Refer to RD note for recommendations -- patient currently on soft diet  Patient has been evaluated by inpatient rehab Dr.Raulkar  Procedures:as above Family communication wife Consults :Gen surgery, ID, PCCM CODE STATUS: FULL DVT Prophylaxis :eliquis Level of  care: Telemetry Medical Status is: Inpatient Remains inpatient appropriate because: complex post surgical complications       TOTAL TIME TAKING CARE OF THIS PATIENT: 35 minutes.  >50% time spent on counselling and coordination of care  Note: This dictation was prepared with Dragon dictation  along with smaller phrase technology. Any transcriptional errors that result from this process are unintentional.  Fritzi Mandes M.D    [facility]   CC: Primary care physician; Hortencia Pilar, MD

## 2022-10-13 NOTE — Progress Notes (Signed)
[place] NOTE       Patient ID: Nicolas Zuniga MRN: [id number] DOB/AGE: [birth year] 66 y.o.  Admit date: 09/24/2022 Referring Physician Sharion Settler, NP  Primary Physician Dr. Hortencia Pilar ([facility])  Primary Cardiologist Nehemiah Massed Reason for Consultation Af RVR  HPI: Nicolas Zuniga is a 31DVV with a PMH of paroxysmal atrial fibrillation (on flecainide, metoprolol, and Eliquis), HFpEF (EF >55, G1 DD 04/2022), type II diabetes, hypertension, recently diagnosed adenocarcinoma of the colon who presented to [facility] 09/24/2022 for and elective laparoscopic right colectomy ultimately converted to open right colectomy and cholecystectomy.  Postoperative course was complicated by hypotension and bradycardia the evening of postop day 1, concerning for beta-blocker toxicity for which he was started on glucagon infusion.  Rapid response was called overnight on 10/21 for atrial fibrillation with RVR and questionable VT, was started on amiodarone and diltiazem infusions with some heart rate response.  On 10/23 he underwent exploratory laparotomy, abdominal washout, and diverting loop ileostomy for intra-abdominal purulent fluid seen on CT abdomen pelvis.  Postoperatively, he remained intubated and requiring vasopressor support.  He converted to atrial flutter with variable conduction the evening of 10/23, Extubated 10/24. He has remained in AF with acceptable rate control on amiodarone and metoprolol. Heparin infusion paused the AM of 10/30 for bloody ostomy output. Required reintubation 10/30, fortunately extubated to [place] on 10/31.  Ongoing issues with AF RVR on 11/3, was restarted back on Eliquis.  Pending dispo to rehab versus CIR.  Interval history: -Feels good today, worked with physical therapy which continues to wipe him out -Eating better -No chest pain, shortness of breath, heart racing or palpitations -In sinus rhythm on telemetry   Past Medical History:  Diagnosis  Date   Adenocarcinoma of colon ([place]) 09/08/2022   Cholelithiasis    Chronic gastritis    Colon polyps    Coronary artery calcification seen on CT scan    Diastolic dysfunction    a.) TTE 04/09/2022: EF >55%, triv TR, G1DD   Diverticulosis    Hypertension    Long term current use of anticoagulant    a.) apixaban   Nephrolithiasis    Paroxysmal A-fib ([place])    a.) CHA2DS2VASc = 3 (age, HTN, T2DM);  b.) s/p DCCV 07/09/2016 (100 J x1), 12/22/2018 (120 J x1), 08/18/2021 (120 J x1), 03/11/2022 (120 J x1), 03/31/2022 (120 J x1); c.) rate/rhythm maintained on oral flecanide + metoprolol succinate; chronically anticoagulated with apixaban   T2DM (type 2 diabetes mellitus) ([place])     Past Surgical History:  Procedure Laterality Date   CARDIOVERSION N/A 12/22/2018   Procedure: CARDIOVERSION (CATH LAB);  Surgeon: Corey Skains, MD;  Location: [facility] ORS;  Service: Cardiovascular;  Laterality: N/A;   CARDIOVERSION N/A 08/18/2021   Procedure: CARDIOVERSION;  Surgeon: Corey Skains, MD;  Location: [facility] ORS;  Service: Cardiovascular;  Laterality: N/A;   CARDIOVERSION N/A 03/11/2022   Procedure: CARDIOVERSION;  Surgeon: Corey Skains, MD;  Location: [facility] ORS;  Service: Cardiovascular;  Laterality: N/A;   CARDIOVERSION N/A 03/31/2022   Procedure: CARDIOVERSION;  Surgeon: Corey Skains, MD;  Location: [facility] ORS;  Service: Cardiovascular;  Laterality: N/A;   CHOLECYSTECTOMY N/A 09/24/2022   Procedure: LAPAROSCOPIC CHOLECYSTECTOMY;  Surgeon: Jules Husbands, MD;  Location: [facility] ORS;  Service: General;  Laterality: N/A;   COLONOSCOPY WITH PROPOFOL N/A 09/08/2022   Procedure: COLONOSCOPY WITH PROPOFOL;  Surgeon: Lucilla Lame, MD;  Location: [facility] END[facility]COPY;  Service: Endoscopy;  Laterality: N/A;  ELECTROPHYSIOLOGIC STUDY N/A 07/09/2016   Procedure: CARDIOVERSION;  Surgeon: Dionisio David, MD;  Location: [facility] ORS;  Service: Cardiovascular;  Laterality: N/A;   LAPAROSCOPIC RIGHT COLECTOMY N/A  09/24/2022   Procedure: LAPAROSCOPIC RIGHT COLECTOMY, RNFA to assist;  Surgeon: Jules Husbands, MD;  Location: [facility] ORS;  Service: General;  Laterality: N/A;   LAPAROTOMY N/A 09/28/2022   Procedure: EXPLORATORY LAPAROTOMY WITH OSTOMY CREATION;  Surgeon: Jules Husbands, MD;  Location: [facility] ORS;  Service: General;  Laterality: N/A;   TEE WITHOUT CARDIOVERSION N/A 07/09/2016   Procedure: TRANSESOPHAGEAL ECHOCARDIOGRAM (TEE);  Surgeon: Dionisio David, MD;  Location: [facility] ORS;  Service: Cardiovascular;  Laterality: N/A;   web fingers repaired     as a child    Medications Prior to Admission  Medication Sig Dispense Refill Last Dose   bisacodyl (DULCOLAX) 5 MG EC tablet Take all 4 tablets at 8 am the morning prior to your surgery. 4 tablet 0 09/23/2022   Cholecalciferol (VITAMIN D) 50 MCG (2000 UT) CAPS Take 2,000 Units by mouth daily.   Past Week   flecainide (TAMBOCOR) 100 MG tablet Take 100 mg by mouth 2 (two) times daily.   09/24/2022   glipiZIDE (GLUCOTROL XL) 10 MG 24 hr tablet Take 10 mg by mouth 2 (two) times daily.    09/24/2022   metFORMIN (GLUCOPHAGE-XR) 500 MG 24 hr tablet Take 500 mg by mouth daily with breakfast.   Past Month   metoprolol (LOPRESSOR) 100 MG tablet Take 100 mg by mouth 2 (two) times daily.   09/24/2022   metroNIDAZOLE (FLAGYL) 500 MG tablet Take 2 tablets at 8AM, take 2 tablets at [facility], and take 2 tablets at 8PM the day prior to your surgery 6 tablet 0 09/23/2022   Multiple Vitamins-Minerals (MULTIVITAMIN WITH MINERALS) tablet Take 1 tablet by mouth daily with breakfast.    Past Week   neomycin (MYCIFRADIN) 500 MG tablet Take 2 tablet at 8am, take 2 tablets at 2pm, and take 2 tablets at 8pm the day prior to your surgery 6 tablet 0 09/23/2022   Omega-3 Fatty Acids (FISH OIL) 1000 MG CAPS Take 1,000 mg by mouth daily.   Past Week   OZEMPIC, 1 MG/DOSE, 4 MG/3ML SOPN Inject 1 mg into the skin every Wednesday. Patient stopped taking these medications   Past Month    polyethylene glycol powder (MIRALAX) 17 GM/SCOOP powder Mix full container in 64 ounces of Gatorade or other clear liquid. NO Red 238 g 0 09/23/2022   Turmeric 400 MG CAPS Take 400 mg by mouth daily.   Past Week   vitamin B-12 (CYANOCOBALAMIN) 100 MCG tablet Take 500 mcg by mouth daily.   Past Week   Vitamin E 400 units TABS Take 400 Units by mouth daily.   Past Week   apixaban (ELIQUIS) 5 MG TABS tablet Take 5 mg by mouth 2 (two) times daily.   09/21/2022   Coenzyme Q10 (COQ10) 100 MG CAPS Take 100 mg by mouth daily.   09/21/2022   Social History   Socioeconomic History   Marital status: Married    Spouse name: Not on file   Number of children: Not on file   Years of education: Not on file   Highest education level: Not on file  Occupational History   Not on file  Tobacco Use   Smoking status: Never    Passive exposure: Never   Smokeless tobacco: Never  Vaping Use   Vaping Use: Never used  Substance and  Sexual Activity   Alcohol use: No   Drug use: No   Sexual activity: Not on file  Other Topics Concern   Not on file  Social History Narrative   Lives with wife, [name], with on pet, cat.   Social Determinants of Health   Financial Resource Strain: Not on file  Food Insecurity: No Food Insecurity (09/24/2022)   Hunger Vital Sign    Worried About Running Out of Food in the Last Year: Never true    Ran Out of Food in the Last Year: Never true  Transportation Needs: No Transportation Needs (09/24/2022)   PRAPARE - Hydrologist (Medical): No    Lack of Transportation (Non-Medical): No  Physical Activity: Not on file  Stress: Not on file  Social Connections: Not on file  Intimate Partner Violence: Not At Risk (09/24/2022)   Humiliation, Afraid, Rape, and Kick questionnaire    Fear of Current or Ex-Partner: No    Emotionally Abused: No    Physically Abused: No    Sexually Abused: No    Family History  Problem Relation Age of Onset    Varicose Veins Mother    Heart disease Mother    Diabetes Mother    COPD Father      Vitals:   10/12/22 1954 10/13/22 0622 10/13/22 0727 10/13/22 1612  BP: 131/61 129/64 121/63 (!) 144/69  Pulse: 77 70 68 79  Resp: '16 18 18 17  '$ Temp: 98.2 F (36.8 C) 98.1 F (36.7 C) 98.1 F (36.7 C) 98.1 F (36.7 C)  TempSrc: Oral Oral Oral Oral  SpO2: 98% 95% 96% 97%  Weight:      Height:        PHYSICAL EXAM General: acutely ill-appearing middle-aged Caucasian male, laying nearly flat in hospital bed on to see.   HEENT:  Normocephalic. TPN R nare Neck:  No JVD.  Lungs: Normal work of breathing on room air. Decreased breath sounds without appreciable crackles or wheezes Heart: Regular rate and rhythm, normal S1 and S2 without gallops or murmurs.  Abdomen: soft, non-distended appearing. Msk: Generalized weakness. Extremities: Warm and well perfused. No clubbing, cyanosis.  Trace bilateral lower extremity edema, Neuro: Alert and oriented x3 Psych:   Answers questions appropriately  Labs: Basic Metabolic Panel: Recent Labs    10/11/22 0435 10/12/22 0428  NA 137 135  K 4.6 4.3  CL 108 106  CO2 24 23  GLUCOSE 94 155*  BUN 30* 27*  CREATININE 1.15 1.14  CALCIUM 8.4* 8.3*  MG 2.0  --   PHOS 3.3  --     Liver Function Tests: No results for input(s): "AST", "ALT", "ALKPHOS", "BILITOT", "PROT", "ALBUMIN" in the last 72 hours.  No results for input(s): "LIPASE", "AMYLASE" in the last 72 hours. CBC: Recent Labs    10/12/22 0428 10/13/22 0609  WBC 17.1* 14.6*  NEUTROABS 12.7* 11.3*  HGB 8.3* 8.4*  HCT 26.0* 26.9*  MCV 77.8* 78.2*  PLT 450* 450*    Cardiac Enzymes: No results for input(s): "CKTOTAL", "CKMB", "CKMBINDEX", "TROPONINIHS" in the last 72 hours.  BNP: No results for input(s): "BNP" in the last 72 hours.  D-Dimer: No results for input(s): "DDIMER" in the last 72 hours. Hemoglobin A1C: No results for input(s): "HGBA1C" in the last 72 hours. Fasting Lipid  Panel: No results for input(s): "CHOL", "HDL", "LDLCALC", "TRIG", "CHOLHDL", "LDLDIRECT" in the last 72 hours.  Thyroid Function Tests: No results for input(s): "TSH", "T4TOTAL", "T3FREE", "THYROIDAB" in  the last 72 hours.  Invalid input(s): "FREET3"  Anemia Panel: No results for input(s): "VITAMINB12", "FOLATE", "FERRITIN", "TIBC", "IRON", "RETICCTPCT" in the last 72 hours.   Radiology: CT ABDOMEN PELVIS WO CONTRAST  Result Date: 10/09/2022 CLINICAL DATA:  Abdomen pain history of colon mass EXAM: CT ABDOMEN AND PELVIS WITHOUT CONTRAST TECHNIQUE: Multidetector CT imaging of the abdomen and pelvis was performed following the standard protocol without IV contrast. RADIATION DOSE REDUCTION: This exam was performed according to the departmental dose-optimization program which includes automated exposure control, adjustment of the mA and/or kV according to patient size and/or use of iterative reconstruction technique. COMPARISON:  Radiograph 10/08/2022, CT 10/05/2022, 10/03/2022, 09/28/2022, 09/17/2022 FINDINGS: Lower chest: Small bilateral pleural effusions, mildly increased. Partial lower lobe consolidations stable to mildly increased. Hepatobiliary: No focal hepatic abnormality. Status post cholecystectomy. No biliary dilatation. Pancreas: Unremarkable. No pancreatic ductal dilatation or surrounding inflammatory changes. Spleen: Normal in size without focal abnormality. Adrenals/Urinary Tract: Adrenal glands are normal. Kidneys show no hydronephrosis. The bladder is unremarkable Stomach/Bowel: Esophageal tube tip in the stomach. Stomach is nondistended. Fluid-filled small bowel without definitive obstruction. Slightly dense intraluminal fluid probably reflects dilute contrast. Patient is status post right hemicolectomy. There is right lower quadrant loop ileostomy. 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 Vascular/Lymphatic: Mild aortic atherosclerosis. No  aneurysm. No suspicious lymph nodes. Reproductive: Prostate is unremarkable. Other: Similar placement of right anterior lower abdominal drainage catheter with tip near the right abdominal surgical staple line. Interim placement of right lateral perihepatic drainage catheter. Decreased right perihepatic fluid collection compared to prior CT with small gas bubble. Multiple ill-defined left upper quadrant fluid collections are stable to slightly increased, these demonstrate simple fluid density. There are multiple additional slightly organized appearing fluid collections within the mesentery and bilateral gutters. Left colic gutter fluid collection measures 4.8 by 2.9 cm, previously 6.4 by 3 cm. Right colic gutter fluid collection measures 6.9 by 3.1 cm, previously 7.5 by 3.1 cm. Left anterior long fluid collection measures 13.5 by 1.2 cm, previously 14.7 x 2 cm. There are multiple additional smaller fluid collections throughout the abdomen and pelvis and interspersed amongst the bowel. Decreased small gas and fluid collection in the right anterior abdomen, series 2, image 41. No increasing free air. Generalized soft tissue stranding throughout the mesentery, slightly progressed. Mild subcutaneous edema consistent with anasarca. Musculoskeletal: No acute or suspicious osseous abnormality. IMPRESSION: 1. Status post right hemicolectomy with right lower quadrant loop ileostomy. Similar positioning of right anterior abdominal drainage catheter with tip near surgical staple line in the right mid abdomen. Decreased fluid collection near the surgical sutures. Interim placement of percutaneous right lateral abdominal drainage perihepatic catheter with overall decreased perihepatic fluid collection compared to prior. Redemonstrated multiple slightly organized appearing intra-abdominal fluid collections, several of which have decreased in size compared to the most recent prior CT. Multiple ill-defined left upper quadrant fluid  collections which are stable to slightly increased compared to prior. 2. Negative for retroperitoneal hematoma or high density intra-abdominal or intrapelvic fluid collection to suggest acute hemorrhage. 3. Slight increased bilateral pleural effusion and lower lobe consolidations which may reflect atelectasis, pneumonia, or aspiration Critical Value/emergent results were called by telephone at the time of interpretation on 10/09/2022 at 5:18 pm to provider Dr. Dahlia Byes, Who verbally acknowledged these results. Electronically Signed   By: Donavan Foil M.D.   On: 10/09/2022 17:19   DG Abd Portable 1V  Result Date: 10/08/2022 CLINICAL DATA:  Placement of feeding tube EXAM:  PORTABLE ABDOMEN - 1 VIEW COMPARISON:  Previous studies including the examination done earlier today FINDINGS: Tip of feeding tube is seen in the region of distal antrum of the stomach. Bowel gas pattern is nonspecific. Surgical drain is seen in right mid abdomen. IMPRESSION: Tip of feeding tube is seen in the distal antrum of the stomach. Electronically Signed   By: Elmer Picker M.D.   On: 10/08/2022 17:29   DG Abd Portable 1V  Result Date: 10/08/2022 CLINICAL DATA:  [id number] Encounter for feeding tube placement [id number] EXAM: PORTABLE ABDOMEN - 1 VIEW COMPARISON:  Radiograph 11/31/2023 FINDINGS: Feeding tube has been slightly retracted, tip overlying the proximal stomach, just beyond the GE junction. Nonobstructive bowel gas pattern. Field of view includes the left upper abdomen. IMPRESSION: Feeding tube is been slightly retracted, tip overlying the proximal stomach just beyond the GE junction, would consider advancement. Electronically Signed   By: Maurine Simmering M.D.   On: 10/08/2022 16:06   DG Abd 1 View  Result Date: 10/06/2022 CLINICAL DATA:  Nasogastric tube placement. EXAM: ABDOMEN - 1 VIEW COMPARISON:  10/05/2022 FINDINGS: The previously demonstrated nasogastric tube has been replaced with a feeding tube with its tip in the  proximal stomach. Normal bowel gas pattern with small amount of contrast in the stomach and bowel. Lumbar and thoracic spine degenerative changes. Mildly progressive left basilar atelectasis. IMPRESSION: 1. Feeding tube tip in the proximal stomach. 2. Mildly progressive left basilar atelectasis. Electronically Signed   By: Claudie Revering M.D.   On: 10/06/2022 12:54   DG Abd 1 View  Result Date: 10/05/2022 CLINICAL DATA:  Orogastric tube placement EXAM: ABDOMEN - 1 VIEW COMPARISON:  None Available. FINDINGS: Orogastric tube tip overlies expected proximal to mid body of the stomach. Central venous catheter tip noted within the superior vena cava. Pigtail drainage catheter overlies the right hepatic lobe. Surgical clips are seen within the right mid abdomen. Visualized abdominal gas pattern is nonspecific due to a paucity of gas within the upper abdomen. No gross free intraperitoneal gas. IMPRESSION: 1. Orogastric tube tip within the proximal to mid body of the stomach. Electronically Signed   By: Fidela Salisbury M.D.   On: 10/05/2022 20:34   DG Chest 1 View  Result Date: 10/05/2022 CLINICAL DATA:  Intubation EXAM: CHEST  1 VIEW COMPARISON:  Chest x-ray 09/28/2022 FINDINGS: Endotracheal tube tip is 3 cm above the carina. Right upper extremity PICC terminates over the distal SVC. Enteric tube extends below the diaphragm, distal tip is not included on the image. Lung volumes are low. There are patchy opacities in the left mid lung and right lung base similar to prior. No pleural effusion or pneumothorax. The cardiomediastinal silhouette is stable, the heart is enlarged. The osseous structures are unchanged. IMPRESSION: 1. Endotracheal tube tip is 3 cm above the carina. 2. Stable patchy opacities in the left mid lung and right lung base. Electronically Signed   By: Ronney Asters M.D.   On: 10/05/2022 18:51   CT GUIDED PERITONEAL/RETROPERITONEAL FLUID DRAIN BY PERC CATH  Result Date: 10/05/2022 INDICATION:  Intra-abdominal fluid collections after prior right hemicolectomy and cholecystectomy for colon carcinoma and cholelithiasis. Persistent significant leukocytosis. EXAM: 1. CT-GUIDED ASPIRATION OF LEFT ANTERIOR PERITONEAL FLUID COLLECTION 2. CT-GUIDED ASPIRATION OF LEFT POSTEROLATERAL PERITONEAL FLUID COLLECTION 3. CT-GUIDED CATHETER DRAINAGE OF RIGHT PERIHEPATIC PERITONEAL FLUID COLLECTION 4. MEDICATIONS: None ANESTHESIA/SEDATION: Moderate (conscious) sedation was employed during this procedure. A total of Versed 2.0 mg and Fentanyl 100 mcg was administered intravenously by  the radiology nurse. Total intra-service moderate Sedation Time: 52 minutes. The patient's level of consciousness and vital signs were monitored continuously by radiology nursing throughout the procedure under my direct supervision. COMPLICATIONS: None immediate. PROCEDURE: Informed written consent was obtained from the patient after a thorough discussion of the procedural risks, benefits and alternatives. All questions were addressed. Maximal Sterile Barrier Technique was utilized including caps, mask, sterile gowns, sterile gloves, sterile drape, hand hygiene and skin antiseptic. A timeout was performed prior to the initiation of the procedure. CT of the abdomen was performed in a supine position. Localizing scans were performed with grids and sites marked on the skin in the upper right abdomen and mid to lower left abdomen. From a left lateral approach, an 18 gauge trocar needle was advanced into an anterior left-sided peritoneal fluid collection. After confirming needle tip position, aspiration was performed via the trocar needle utilizing syringes. A sample was sent for culture analysis. The needle was removed after additional CT was performed. A second 18 gauge trocar needle was then advanced from a left-sided approach into a left posterolateral peritoneal fluid collection. Aspiration was performed through the trocar needle. A fluid sample  was sent for culture analysis. The needle was removed after additional CT imaging. An 18 gauge trocar needle was advanced in the right upper abdomen into the lateral perihepatic space. After confirming needle tip position and aspiration of fluid, a guidewire was advanced into the fluid collection. The percutaneous tract was dilated over the guidewire and a 10 French percutaneous drainage catheter advanced into the lateral perihepatic space. Catheter position was confirmed by CT. A fluid sample was withdrawn and sent for culture analysis. The drainage catheter was attached to a suction bulb and secured at the skin with a Prolene retention suture and adhesive device. FINDINGS: Aspiration of the elongated left anterior fluid collection immediately deep to the anterior abdominal wall yielded thin, clear yellow fluid. A total volume of 90 mL of fluid was able to be aspirated from this region resulting in complete decompression of the collection by CT after aspiration. Aspiration of the teardrop shaped fluid collection along the lateral paracolic region of the peritoneal cavity yielded darker yellow fluid which was slightly turbid, especially towards the end of aspiration. Aspiration yielded 50 mL of fluid resulting in near complete decompression of the collection by CT after aspiration. Aspiration of the right lateral perihepatic fluid collection yielded thin, clear yellow fluid. Given that this is the largest single area fluid present in the peritoneal cavity currently, it was elected to place a drainage catheter in this fluid collection. IMPRESSION: 1. CT-guided needle aspiration of left anterior peritoneal fluid collection yielded 90 mL of thin, clear yellow fluid resulting in complete decompression. A fluid sample from this collection was sent for culture analysis. 2. CT-guided needle aspiration left posterolateral paracolic fluid collection yielded 50 mL of slightly turbid yellow fluid resulting in near complete  decompression. A fluid sample was sent for culture analysis. 3. CT-guided percutaneous catheter drainage was performed of the perihepatic fluid collection from a right lateral approach yielding thin, clear yellow fluid. A 10 French drainage catheter was placed and attached to suction bulb drainage. Electronically Signed   By: Aletta Edouard M.D.   On: 10/05/2022 16:22   CT GUIDED NEEDLE PLACEMENT  Result Date: 10/05/2022 INDICATION: Intra-abdominal fluid collections after prior right hemicolectomy and cholecystectomy for colon carcinoma and cholelithiasis. Persistent significant leukocytosis. EXAM: 1. CT-GUIDED ASPIRATION OF LEFT ANTERIOR PERITONEAL FLUID COLLECTION 2. CT-GUIDED ASPIRATION OF  LEFT POSTEROLATERAL PERITONEAL FLUID COLLECTION 3. CT-GUIDED CATHETER DRAINAGE OF RIGHT PERIHEPATIC PERITONEAL FLUID COLLECTION 4. MEDICATIONS: None ANESTHESIA/SEDATION: Moderate (conscious) sedation was employed during this procedure. A total of Versed 2.0 mg and Fentanyl 100 mcg was administered intravenously by the radiology nurse. Total intra-service moderate Sedation Time: 52 minutes. The patient's level of consciousness and vital signs were monitored continuously by radiology nursing throughout the procedure under my direct supervision. COMPLICATIONS: None immediate. PROCEDURE: Informed written consent was obtained from the patient after a thorough discussion of the procedural risks, benefits and alternatives. All questions were addressed. Maximal Sterile Barrier Technique was utilized including caps, mask, sterile gowns, sterile gloves, sterile drape, hand hygiene and skin antiseptic. A timeout was performed prior to the initiation of the procedure. CT of the abdomen was performed in a supine position. Localizing scans were performed with grids and sites marked on the skin in the upper right abdomen and mid to lower left abdomen. From a left lateral approach, an 18 gauge trocar needle was advanced into an anterior  left-sided peritoneal fluid collection. After confirming needle tip position, aspiration was performed via the trocar needle utilizing syringes. A sample was sent for culture analysis. The needle was removed after additional CT was performed. A second 18 gauge trocar needle was then advanced from a left-sided approach into a left posterolateral peritoneal fluid collection. Aspiration was performed through the trocar needle. A fluid sample was sent for culture analysis. The needle was removed after additional CT imaging. An 18 gauge trocar needle was advanced in the right upper abdomen into the lateral perihepatic space. After confirming needle tip position and aspiration of fluid, a guidewire was advanced into the fluid collection. The percutaneous tract was dilated over the guidewire and a 10 French percutaneous drainage catheter advanced into the lateral perihepatic space. Catheter position was confirmed by CT. A fluid sample was withdrawn and sent for culture analysis. The drainage catheter was attached to a suction bulb and secured at the skin with a Prolene retention suture and adhesive device. FINDINGS: Aspiration of the elongated left anterior fluid collection immediately deep to the anterior abdominal wall yielded thin, clear yellow fluid. A total volume of 90 mL of fluid was able to be aspirated from this region resulting in complete decompression of the collection by CT after aspiration. Aspiration of the teardrop shaped fluid collection along the lateral paracolic region of the peritoneal cavity yielded darker yellow fluid which was slightly turbid, especially towards the end of aspiration. Aspiration yielded 50 mL of fluid resulting in near complete decompression of the collection by CT after aspiration. Aspiration of the right lateral perihepatic fluid collection yielded thin, clear yellow fluid. Given that this is the largest single area fluid present in the peritoneal cavity currently, it was elected  to place a drainage catheter in this fluid collection. IMPRESSION: 1. CT-guided needle aspiration of left anterior peritoneal fluid collection yielded 90 mL of thin, clear yellow fluid resulting in complete decompression. A fluid sample from this collection was sent for culture analysis. 2. CT-guided needle aspiration left posterolateral paracolic fluid collection yielded 50 mL of slightly turbid yellow fluid resulting in near complete decompression. A fluid sample was sent for culture analysis. 3. CT-guided percutaneous catheter drainage was performed of the perihepatic fluid collection from a right lateral approach yielding thin, clear yellow fluid. A 10 French drainage catheter was placed and attached to suction bulb drainage. Electronically Signed   By: Aletta Edouard M.D.   On: 10/05/2022 16:22  CT GUIDED NEEDLE PLACEMENT  Result Date: 10/05/2022 INDICATION: Intra-abdominal fluid collections after prior right hemicolectomy and cholecystectomy for colon carcinoma and cholelithiasis. Persistent significant leukocytosis. EXAM: 1. CT-GUIDED ASPIRATION OF LEFT ANTERIOR PERITONEAL FLUID COLLECTION 2. CT-GUIDED ASPIRATION OF LEFT POSTEROLATERAL PERITONEAL FLUID COLLECTION 3. CT-GUIDED CATHETER DRAINAGE OF RIGHT PERIHEPATIC PERITONEAL FLUID COLLECTION 4. MEDICATIONS: None ANESTHESIA/SEDATION: Moderate (conscious) sedation was employed during this procedure. A total of Versed 2.0 mg and Fentanyl 100 mcg was administered intravenously by the radiology nurse. Total intra-service moderate Sedation Time: 52 minutes. The patient's level of consciousness and vital signs were monitored continuously by radiology nursing throughout the procedure under my direct supervision. COMPLICATIONS: None immediate. PROCEDURE: Informed written consent was obtained from the patient after a thorough discussion of the procedural risks, benefits and alternatives. All questions were addressed. Maximal Sterile Barrier Technique was  utilized including caps, mask, sterile gowns, sterile gloves, sterile drape, hand hygiene and skin antiseptic. A timeout was performed prior to the initiation of the procedure. CT of the abdomen was performed in a supine position. Localizing scans were performed with grids and sites marked on the skin in the upper right abdomen and mid to lower left abdomen. From a left lateral approach, an 18 gauge trocar needle was advanced into an anterior left-sided peritoneal fluid collection. After confirming needle tip position, aspiration was performed via the trocar needle utilizing syringes. A sample was sent for culture analysis. The needle was removed after additional CT was performed. A second 18 gauge trocar needle was then advanced from a left-sided approach into a left posterolateral peritoneal fluid collection. Aspiration was performed through the trocar needle. A fluid sample was sent for culture analysis. The needle was removed after additional CT imaging. An 18 gauge trocar needle was advanced in the right upper abdomen into the lateral perihepatic space. After confirming needle tip position and aspiration of fluid, a guidewire was advanced into the fluid collection. The percutaneous tract was dilated over the guidewire and a 10 French percutaneous drainage catheter advanced into the lateral perihepatic space. Catheter position was confirmed by CT. A fluid sample was withdrawn and sent for culture analysis. The drainage catheter was attached to a suction bulb and secured at the skin with a Prolene retention suture and adhesive device. FINDINGS: Aspiration of the elongated left anterior fluid collection immediately deep to the anterior abdominal wall yielded thin, clear yellow fluid. A total volume of 90 mL of fluid was able to be aspirated from this region resulting in complete decompression of the collection by CT after aspiration. Aspiration of the teardrop shaped fluid collection along the lateral paracolic  region of the peritoneal cavity yielded darker yellow fluid which was slightly turbid, especially towards the end of aspiration. Aspiration yielded 50 mL of fluid resulting in near complete decompression of the collection by CT after aspiration. Aspiration of the right lateral perihepatic fluid collection yielded thin, clear yellow fluid. Given that this is the largest single area fluid present in the peritoneal cavity currently, it was elected to place a drainage catheter in this fluid collection. IMPRESSION: 1. CT-guided needle aspiration of left anterior peritoneal fluid collection yielded 90 mL of thin, clear yellow fluid resulting in complete decompression. A fluid sample from this collection was sent for culture analysis. 2. CT-guided needle aspiration left posterolateral paracolic fluid collection yielded 50 mL of slightly turbid yellow fluid resulting in near complete decompression. A fluid sample was sent for culture analysis. 3. CT-guided percutaneous catheter drainage was performed of the perihepatic  fluid collection from a right lateral approach yielding thin, clear yellow fluid. A 10 French drainage catheter was placed and attached to suction bulb drainage. Electronically Signed   By: Aletta Edouard M.D.   On: 10/05/2022 16:22   CT ABDOMEN PELVIS WO CONTRAST  Result Date: 10/03/2022 CLINICAL DATA:  Abdominal pain. Postop leukocytosis. Status post right hemicolectomy for colon mass on 09/24/2022. Patient was re-explored 09/28/2022 and found to have purulent fluid in the right upper quadrant with possible Peri anastomotic abscess. EXAM: CT ABDOMEN AND PELVIS WITHOUT CONTRAST TECHNIQUE: Multidetector CT imaging of the abdomen and pelvis was performed following the standard protocol without IV contrast. RADIATION DOSE REDUCTION: This exam was performed according to the departmental dose-optimization program which includes automated exposure control, adjustment of the mA and/or kV according to patient  size and/or use of iterative reconstruction technique. COMPARISON:  09/28/2022 FINDINGS: Lower chest: Dependent collapse/consolidation is seen in both lungs with small bilateral pleural effusions. Hepatobiliary: No suspicious focal abnormality in the liver on this study without intravenous contrast. Gallbladder surgically absent. No intrahepatic or extrahepatic biliary dilation. Pancreas: No focal mass lesion. No dilatation of the main duct. No intraparenchymal cyst. No peripancreatic edema. Spleen: No splenomegaly. No focal mass lesion. Adrenals/Urinary Tract: No adrenal nodule or mass. Kidneys unremarkable. No evidence for hydroureter. Bladder is decompressed by Foley catheter. Gas in the bladder lumen is compatible with the instrumentation. Stomach/Bowel: Oral contrast material is seen in the stomach. Duodenum is normally positioned as is the ligament of Treitz. No small bowel wall thickening. No small bowel dilatation. Loop ileostomy noted right abdomen. Right hemicolectomy. Transverse and left colon is opacified and decompressed. Surgical drain is identified in the right abdomen adjacent to the ileocolic anastomosis. Vascular/Lymphatic: There is mild atherosclerotic calcification of the abdominal aorta without aneurysm. There is no gastrohepatic or hepatoduodenal ligament lymphadenopathy. No retroperitoneal or mesenteric lymphadenopathy. No pelvic sidewall lymphadenopathy. Reproductive: The prostate gland and seminal vesicles are unremarkable. Other: Small volume free fluid is seen adjacent to the liver, similar to prior. Scattered collections of fluid are seen in the mesentery and bilateral paracolic gutters, also similar to prior. Volume of intraperitoneal gas has decreased in the interval. 14.7 x 2.0 cm relatively well-defined Nicolas Zuniga shaped collection of fluid is identified anterior abdomen (image 53/2). Open midline wound evident. Musculoskeletal: No worrisome lytic or sclerotic osseous abnormality.  IMPRESSION: 1. Decreasing intraperitoneal free gas since 09/28/2022 as expected. Free fluid around the liver is similar to prior with multiple fluid collections in the mesentery in fluid pooling in the para colic gutters bilaterally. No definite abscess at this time although lack of intravenous contrast lessens sensitivity for detection. Superinfection of any of the intraperitoneal fluid collections cannot be excluded on this study. 2. Right abdominal loop ileostomy in this patient with right hemicolectomy. 3. Dependent collapse/consolidation in both lungs with small bilateral pleural effusions. 4.  Aortic Atherosclerosis (ICD10-I70.0). Electronically Signed   By: Misty Stanley M.D.   On: 10/03/2022 15:48   ECHOCARDIOGRAM COMPLETE  Result Date: 09/29/2022    ECHOCARDIOGRAM REPORT   Patient Name:   Nicolas Zuniga Date of Exam: 09/29/2022 Medical Rec #:  [id number]     Height:       74.0 in Accession #:    [id number]    Weight:       260.1 lb Date of Birth:  07-12-1956      BSA:          2.430 m Patient Age:    66 years  BP:           126/74 mmHg Patient Gender: M             HR:           83 bpm. Exam Location:  [facility] Procedure: 2D Echo, Color Doppler and Cardiac Doppler Indications:     Abnormal ECG R94.31  History:         Patient has prior history of Echocardiogram examinations, most                  recent 07/09/2016. Risk Factors:Hypertension and Diabetes.                  Paroxysmal Afib.  Sonographer:     Sherrie Sport Referring Phys:  [id number] Teressa Lower Diagnosing Phys: Serafina Royals MD  Sonographer Comments: Echo performed with patient supine and on artificial respirator, Technically challenging study due to limited acoustic windows and no subcostal window. IMPRESSIONS  1. Left ventricular ejection fraction, by estimation, is 65 to 70%. The left ventricle has normal function. The left ventricle has no regional wall motion abnormalities. Left ventricular diastolic parameters were normal.  2. Right  ventricular systolic function is normal. The right ventricular size is normal.  3. The mitral valve is normal in structure. Trivial mitral valve regurgitation.  4. The aortic valve is normal in structure. Aortic valve regurgitation is not visualized. FINDINGS  Left Ventricle: Left ventricular ejection fraction, by estimation, is 65 to 70%. The left ventricle has normal function. The left ventricle has no regional wall motion abnormalities. The left ventricular internal cavity size was small. There is no left ventricular hypertrophy. Left ventricular diastolic parameters were normal. Right Ventricle: The right ventricular size is normal. No increase in right ventricular wall thickness. Right ventricular systolic function is normal. Left Atrium: Left atrial size was normal in size. Right Atrium: Right atrial size was normal in size. Pericardium: There is no evidence of pericardial effusion. Mitral Valve: The mitral valve is normal in structure. Trivial mitral valve regurgitation. Tricuspid Valve: The tricuspid valve is normal in structure. Tricuspid valve regurgitation is trivial. Aortic Valve: The aortic valve is normal in structure. Aortic valve regurgitation is not visualized. Aortic valve mean gradient measures 2.0 mmHg. Aortic valve peak gradient measures 3.4 mmHg. Aortic valve area, by VTI measures 3.50 cm. Pulmonic Valve: The pulmonic valve was normal in structure. Pulmonic valve regurgitation is not visualized. Aorta: The aortic root and ascending aorta are structurally normal, with no evidence of dilitation. IAS/Shunts: No atrial level shunt detected by color flow Doppler.  LEFT VENTRICLE PLAX 2D LVIDd:         4.60 cm LVIDs:         3.30 cm LV PW:         1.20 cm LV IVS:        0.80 cm LVOT diam:     2.00 cm LV SV:         43 LV SV Index:   18 LVOT Area:     3.14 cm  RIGHT VENTRICLE RV Basal diam:  4.10 cm RV Mid diam:    3.80 cm RV S prime:     11.40 cm/s TAPSE (M-mode): 2.0 cm LEFT ATRIUM           Index         RIGHT ATRIUM           Index LA diam:      3.10 cm 1.28 cm/m  RA Area:     14.80 cm LA Vol (A2C): 40.0 ml 16.46 ml/m  RA Volume:   37.20 ml  15.31 ml/m LA Vol (A4C): 70.1 ml 28.84 ml/m  AORTIC VALVE AV Area (Vmax):    2.22 cm AV Area (Vmean):   2.37 cm AV Area (VTI):     3.50 cm AV Vmax:           91.60 cm/s AV Vmean:          67.100 cm/s AV VTI:            0.122 m AV Peak Grad:      3.4 mmHg AV Mean Grad:      2.0 mmHg LVOT Vmax:         64.70 cm/s LVOT Vmean:        50.700 cm/s LVOT VTI:          0.136 m LVOT/AV VTI ratio: 1.11  AORTA Ao Root diam: 3.20 cm MITRAL VALVE               TRICUSPID VALVE MV Area (PHT): 3.40 cm    TR Peak grad:   19.4 mmHg MV Decel Time: 223 msec    TR Vmax:        220.00 cm/s MV E velocity: 85.90 cm/s                            SHUNTS                            Systemic VTI:  0.14 m                            Systemic Diam: 2.00 cm Serafina Royals MD Electronically signed by Serafina Royals MD Signature Date/Time: 09/29/2022/12:21:30 PM    Final    DG Abd 1 View  Result Date: 09/28/2022 CLINICAL DATA:  Enteric catheter placement, postop EXAM: ABDOMEN - 1 VIEW COMPARISON:  09/26/2022 FINDINGS: Frontal view of the lower chest and upper abdomen demonstrates enteric catheter passing below diaphragm, tip projecting over the gastric antrum. Left basilar consolidation compatible with atelectasis. Bowel gas pattern is unremarkable. IMPRESSION: 1. Enteric catheter tip projecting over the gastric antrum. Electronically Signed   By: Randa Ngo M.D.   On: 09/28/2022 15:52   DG Chest Port 1 View  Result Date: 09/28/2022 CLINICAL DATA:  Postop, intubated EXAM: PORTABLE CHEST 1 VIEW COMPARISON:  09/27/2022 FINDINGS: Single frontal view of the chest demonstrates endotracheal tube overlying tracheal air column tip at level of thoracic inlet. Enteric catheter passes below diaphragm tip excluded by collimation. Right-sided PICC tip overlies the right atrium. Numerous cardiac  leads overlie the central chest. The cardiac silhouette is unremarkable. Lung volumes are diminished, with left basilar consolidation most consistent with atelectasis. Trace left pleural effusion. No pneumothorax. No acute bony abnormalities. IMPRESSION: 1. Support devices as above. 2. Increasing left basilar consolidation, favor atelectasis over pneumonia. 3. Trace left pleural effusion. Electronically Signed   By: Randa Ngo M.D.   On: 09/28/2022 15:51   CT ABDOMEN PELVIS WO CONTRAST  Result Date: 09/28/2022 CLINICAL DATA:  Status post right hemicolectomy 09/24/2022. Anastomotic complication suspected. EXAM: CT ABDOMEN AND PELVIS WITHOUT CONTRAST TECHNIQUE: Multidetector CT imaging of the abdomen and pelvis was performed following the standard protocol without IV contrast. RADIATION DOSE REDUCTION: This exam was performed according  to the departmental dose-optimization program which includes automated exposure control, adjustment of the mA and/or kV according to patient size and/or use of iterative reconstruction technique. COMPARISON:  09/27/2022 FINDINGS: Lower chest: Bibasilar atelectasis noted with tiny bilateral pleural effusions. Hepatobiliary: No suspicious focal abnormality in the liver on this study without intravenous contrast. Gallbladder is surgically absent. No intrahepatic or extrahepatic biliary dilation. Pancreas: No focal mass lesion. No dilatation of the main duct. No intraparenchymal cyst. No peripancreatic edema. Spleen: No splenomegaly. No focal mass lesion. Adrenals/Urinary Tract: No adrenal nodule or mass. Contrast in the kidneys compatible with residual from yesterday's infuse scan. Small cyst noted left kidney. No evidence for hydroureter. The urinary bladder appears normal for the degree of distention. Stomach/Bowel: Stomach is distended with gas and fluid. Duodenum is normally positioned as is the ligament of Treitz. No small bowel wall thickening. No small bowel dilatation. The  distal ileum at the ileocolic anastomosis in the proximal transverse colon at the anastomosis is well opacified with enteric contrast material. While there is no evidence for gross contrast extravasation at the level of the anastomosis, the small fluid collection identified on yesterday's study has increased in attenuation in the interval (26 Hounsfield units previously versus 67 Hounsfield units today). This increase in attenuation is concerning for some trace spill of contrast from the anastomosis into the collection although blood products or infectious debris could cause increase in attenuation. This finding is associated with a slight increase in fluid volume in the right paracolic gutter. Diverticular changes are seen in the left colon as before. Vascular/Lymphatic: There is mild atherosclerotic calcification of the abdominal aorta without aneurysm. There is no gastrohepatic or hepatoduodenal ligament lymphadenopathy. No retroperitoneal or mesenteric lymphadenopathy. No pelvic sidewall lymphadenopathy. Reproductive: The hypoattenuating collection seen previously in the prostate gland are less prominent today. Other: Free fluid and free gas in the peritoneal cavity is again noted. Free gas is not unexpected 4 days out from surgery. As noted above, the volume of fluid in the right paracolic gutter is slightly increased in the interval. Musculoskeletal: Gas in the midline subcutaneous tissues deep to the staple line has increased in the interval No worrisome lytic or sclerotic osseous abnormality. IMPRESSION: 1. While there is no evidence for gross enteric contrast extravasation at the level of the ileocolic anastomosis, the small fluid collection adjacent to the anastomosis identified on yesterday's study has increased in attenuation in the interval. This increase in attenuation is concerning for some trace spill of contrast from the anastomosis into the collection. 2. Slight increase in fluid volume in the  right paracolic gutter with similar fluid volume around the liver and in the left paracolic gutter. Collections of interloop mesenteric fluid are not substantially changed. 3. Free gas in the peritoneal cavity is not unexpected 4 days out from surgery. 4. Gas in the midline subcutaneous tissues deep to the staple line has increased in the interval. 5. Contrast in the kidneys compatible with residual from yesterday's infused scan. 6. Bibasilar atelectasis with tiny bilateral pleural effusions. 7. Aortic Atherosclerosis (ICD10-I70.0). Findings were Dr. Dahlia Byes at approximately 1105 hours on 09/28/2022. Electronically Signed   By: Misty Stanley M.D.   On: 09/28/2022 11:22   DG Chest Port 1 View  Result Date: 09/27/2022 CLINICAL DATA:  PICC line placement. EXAM: PORTABLE CHEST 1 VIEW COMPARISON:  Abdominal series 09/26/2022. CT abdomen and pelvis 09/27/2022 FINDINGS: Shallow inspiration with atelectasis in the lung bases. Cardiac enlargement. No airspace disease or consolidation in the lungs. A right  PICC line has been placed with tip over the cavoatrial junction. No pneumothorax. Pneumoperitoneum is demonstrated below the right hemidiaphragm as seen on prior studies, likely postoperative. IMPRESSION: 1. Right PICC line appears in satisfactory position. 2. Shallow inspiration with atelectasis in the lung bases. Cardiac enlargement. 3. Pneumoperitoneum as seen on prior studies, probably postoperative. Electronically Signed   By: Lucienne Capers M.D.   On: 09/27/2022 19:25   [place] EKG SITE RITE  Result Date: 09/27/2022 If Site Rite image not attached, placement could not be confirmed due to current cardiac rhythm.  CT ABDOMEN PELVIS W CONTRAST  Result Date: 09/27/2022 CLINICAL DATA:  66 year old male with RIGHT colectomy for adenocarcinoma and cholecystectomy performed on 09/24/2022. Hypotension and abdominal discomfort. EXAM: CT ABDOMEN AND PELVIS WITH CONTRAST TECHNIQUE: Multidetector CT imaging of the  abdomen and pelvis was performed using the standard protocol following bolus administration of intravenous contrast. RADIATION DOSE REDUCTION: This exam was performed according to the departmental dose-optimization program which includes automated exposure control, adjustment of the mA and/or kV according to patient size and/or use of iterative reconstruction technique. CONTRAST:  128m OMNIPAQUE IOHEXOL 350 MG/ML SOLN COMPARISON:  09/17/2022 CT FINDINGS: Lower chest: Trace bilateral pleural effusions and bibasilar atelectasis noted. Hepatobiliary: The liver is unremarkable. The patient is status post cholecystectomy. There is no evidence of intrahepatic or extrahepatic biliary dilatation. Pancreas: A small amount of ill-defined fluid anterior to the pancreas is noted but the remainder of the pancreas is unremarkable. Spleen: Unremarkable Adrenals/Urinary Tract: No significant abnormalities of the kidneys, adrenal glands or bladder noted. No evidence of hydronephrosis. Stomach/Bowel: RIGHT colectomy and ileo colonic anastomosis noted. There is a small amount of ill-defined high density fluid along the suture line (images 35-39: Series 4), which may represent a small amount of extravasated contrast/leak. There is a small amount of ascites within the abdomen and pelvis with small to moderate amount of pneumoperitoneum within the abdomen. There is no evidence of focal collection/defined abscess. There is no evidence of bowel obstruction. No definite bowel wall thickening is identified. Vascular/Lymphatic: Aortic atherosclerosis. No enlarged abdominal or pelvic lymph nodes. Reproductive: 2 low-density areas within the prostate gland are noted, measuring 1.7 cm on the LEFT and 1 cm on the RIGHT. Other: Ill-defined stranding within the abdomen and pelvis noted likely recent surgical changes. Musculoskeletal: No acute or suspicious bony abnormalities are identified. IMPRESSION: 1. Ill-defined slightly high density fluid  along the ileocolic suture line, which may represent a small amount of extravasated contrast/leak. Small to moderate amount of pneumoperitoneum and small amount of ascites within the abdomen and pelvis, not unexpected given surgery but nonspecific. No evidence of focal collection/defined abscess. 2. Trace bilateral pleural effusions and bibasilar atelectasis. 3. 2 low-density areas within the prostate gland, nonspecific but correlate with PSA. 4.  Aortic Atherosclerosis (ICD10-I70.0). Critical Value/emergent results discussed by phone at the time of interpretation on 09/27/2022 at 1:50 pm to provider DIEGO PABON , who verbally acknowledged these results. Electronically Signed   By: JMargarette CanadaM.D.   On: 09/27/2022 13:51   CT Angio Chest Pulmonary Embolism (PE) W or WO Contrast  Result Date: 09/27/2022 CLINICAL DATA:  Shortness of breath. EXAM: CT ANGIOGRAPHY CHEST WITH CONTRAST TECHNIQUE: Multidetector CT imaging of the chest was performed using the standard protocol during bolus administration of intravenous contrast. Multiplanar CT image reconstructions and MIPs were obtained to evaluate the vascular anatomy. RADIATION DOSE REDUCTION: This exam was performed according to the departmental dose-optimization program which includes automated exposure control, adjustment  of the mA and/or kV according to patient size and/or use of iterative reconstruction technique. CONTRAST:  171m OMNIPAQUE IOHEXOL 350 MG/ML SOLN COMPARISON:  September 17, 2022. FINDINGS: Cardiovascular: Satisfactory opacification of the pulmonary arteries to the segmental level. No evidence of pulmonary embolism. Normal heart size. No pericardial effusion. Mediastinum/Nodes: No enlarged mediastinal, hilar, or axillary lymph nodes. Thyroid gland, trachea, and esophagus demonstrate no significant findings. Lungs/Pleura: No pneumothorax is noted. Minimal bilateral pleural effusions are noted with adjacent subsegmental atelectasis. Upper Abdomen:  Pneumoperitoneum is noted in the visualized portion of upper abdomen consistent with the reported history of recent laparoscopic right colectomy and cholecystectomy. Musculoskeletal: No chest wall abnormality. No acute or significant osseous findings. Review of the MIP images confirms the above findings. IMPRESSION: No definite evidence of pulmonary embolus. Minimal bilateral pleural effusions are noted with adjacent subsegmental atelectasis. Pneumoperitoneum is noted in visualized portion of upper abdomen consistent with reported history of recent laparoscopic right colectomy and cholecystectomy. Electronically Signed   By: JMarijo ConceptionM.D.   On: 09/27/2022 13:38   DG ABD ACUTE 2+V W 1V CHEST  Result Date: 09/26/2022 CLINICAL DATA:  Ileus following open right colectomy on 09/24/2022 EXAM: DG ABDOMEN ACUTE WITH 1 VIEW CHEST COMPARISON:  09/17/2022 FINDINGS: Gaseous distension of the stomach. Air is seen within the colon. No dilated loops of small bowel. Small-moderate volume pneumoperitoneum. Numerous surgical clips within the right hemiabdomen. Midline abdominal skin staples. Heart size within normal limits. Low lung volumes. Trace left pleural effusion with left basilar atelectasis. No pneumothorax. IMPRESSION: 1. Small-moderate volume pneumoperitoneum, likely related to recent surgery. 2. Gaseous distension of the stomach. No dilated loops of small bowel to suggest obstruction. Electronically Signed   By: NDavina PokeD.O.   On: 09/26/2022 10:57   CT Chest W Contrast  Result Date: 09/18/2022 CLINICAL DATA:  Colon cancer diagnosed on 09/08/2022. Elevated CEA. Staging evaluation. EXAM: CT CHEST, ABDOMEN, AND PELVIS WITH CONTRAST TECHNIQUE: Multidetector CT imaging of the chest, abdomen and pelvis was performed following the standard protocol during bolus administration of intravenous contrast. RADIATION DOSE REDUCTION: This exam was performed according to the departmental dose-optimization program  which includes automated exposure control, adjustment of the mA and/or kV according to patient size and/or use of iterative reconstruction technique. CONTRAST:  1049mOMNIPAQUE IOHEXOL 300 MG/ML  SOLN COMPARISON:  Abdomen and pelvis CT dated 03/07/2020 FINDINGS: CT CHEST FINDINGS Cardiovascular: Minimal coronary artery calcification. Normal sized heart. Mediastinum/Nodes: No enlarged mediastinal, hilar, or axillary lymph nodes. Thyroid gland, trachea, and esophagus demonstrate no significant findings. Lungs/Pleura: Minimal linear atelectasis or scarring at the left lung base. 5 x 3 mm left lower lobe nodule with a mean diameter of 4 mm on image number 100/5. Taking differences in slice thickness into account, this demonstrates little if any change since 03/07/2020. There is also a small calcified granuloma more superiorly in the left lower lobe on image number 77/5. On image number 97/5 and coronal image number 58/6, there is a 4 x 2 mm oval nodular density with a mean diameter of 3 mm. It is difficult determine if this is a lung nodule or prominent vessel branch point. No other lung nodules seen. No pleural fluid. Musculoskeletal: Thoracic and lower cervical spine degenerative changes. No evidence of bony metastatic disease. CT ABDOMEN PELVIS FINDINGS Hepatobiliary: Multiple small noncalcified gallstones in the dependent portion of the gallbladder measuring up 5 mm in maximum diameter each. No gallbladder wall thickening or pericholecystic fluid. Unremarkable liver. No liver masses  are seen. Pancreas: Unremarkable. No pancreatic ductal dilatation or surrounding inflammatory changes. Spleen: Normal in size without focal abnormality. Adrenals/Urinary Tract: Normal appearing adrenal glands. Small bilateral renal cysts. These do not need imaging follow-up. Poorly distended urinary bladder. No ureteral abnormalities seen. Stomach/Bowel: Interval concentric mass involving the right colon and ileocecal valve measuring 7.3  cm in length on coronal image number 88/6 with a maximum wall thickness of 1.7 cm on the same image. This mass is confluent with an immediately adjacent mass medially with central low density, measuring 3.8 x 3.0 cm on image number 92/3 and 4.1 cm in length on coronal image number 83/6. There are multiple adjacent lymph node medial to these masses. The largest has a short axis diameter of 2.4 cm on image number 86/3. Multiple descending and sigmoid colon diverticula. Mild wall thickening involving the terminal ileum at the ileocecal valve. The remainder of the small bowel is unremarkable. Normal-appearing appendix and stomach. Vascular/Lymphatic: Mild atheromatous arterial calcifications without aneurysm. Multiple enlarged mesenteric lymph nodes medial to the right colon mass, as described above. No other enlarged lymph nodes seen. Reproductive: Prostate is unremarkable. Other: No abdominal wall hernia or abnormality. No abdominopelvic ascites. Musculoskeletal: Lumbar spine degenerative changes. Mild bilateral hip degenerative changes. No evidence of bony metastatic disease. IMPRESSION: 1. Interval concentric mass involving the right colon, ileocecal valve and distal terminal ileum, compatible with the patient's known colon carcinoma. No associated obstruction. 2. Adjacent mass with central low density, compatible with local extension of necrotic tumor or an enlarged, necrotic metastatic lymph node. 3. Multiple adjacent enlarged mesenteric lymph nodes medial to these masses, compatible with metastatic adenopathy. 4. Colonic diverticulosis. 5. Cholelithiasis. 6. 4 mm mean diameter left lower lobe nodule possible 3 mm mean diameter right middle lobe nodule. These are felt to have a low likelihood of representing metastatic disease and could be followed on subsequent examinations. Electronically Signed   By: Claudie Revering M.D.   On: 09/18/2022 17:04   CT Abdomen Pelvis W Contrast  Result Date: 09/18/2022 CLINICAL  DATA:  Colon cancer diagnosed on 09/08/2022. Elevated CEA. Staging evaluation. EXAM: CT CHEST, ABDOMEN, AND PELVIS WITH CONTRAST TECHNIQUE: Multidetector CT imaging of the chest, abdomen and pelvis was performed following the standard protocol during bolus administration of intravenous contrast. RADIATION DOSE REDUCTION: This exam was performed according to the departmental dose-optimization program which includes automated exposure control, adjustment of the mA and/or kV according to patient size and/or use of iterative reconstruction technique. CONTRAST:  167m OMNIPAQUE IOHEXOL 300 MG/ML  SOLN COMPARISON:  Abdomen and pelvis CT dated 03/07/2020 FINDINGS: CT CHEST FINDINGS Cardiovascular: Minimal coronary artery calcification. Normal sized heart. Mediastinum/Nodes: No enlarged mediastinal, hilar, or axillary lymph nodes. Thyroid gland, trachea, and esophagus demonstrate no significant findings. Lungs/Pleura: Minimal linear atelectasis or scarring at the left lung base. 5 x 3 mm left lower lobe nodule with a mean diameter of 4 mm on image number 100/5. Taking differences in slice thickness into account, this demonstrates little if any change since 03/07/2020. There is also a small calcified granuloma more superiorly in the left lower lobe on image number 77/5. On image number 97/5 and coronal image number 58/6, there is a 4 x 2 mm oval nodular density with a mean diameter of 3 mm. It is difficult determine if this is a lung nodule or prominent vessel branch point. No other lung nodules seen. No pleural fluid. Musculoskeletal: Thoracic and lower cervical spine degenerative changes. No evidence of bony metastatic disease. CT ABDOMEN  PELVIS FINDINGS Hepatobiliary: Multiple small noncalcified gallstones in the dependent portion of the gallbladder measuring up 5 mm in maximum diameter each. No gallbladder wall thickening or pericholecystic fluid. Unremarkable liver. No liver masses are seen. Pancreas: Unremarkable. No  pancreatic ductal dilatation or surrounding inflammatory changes. Spleen: Normal in size without focal abnormality. Adrenals/Urinary Tract: Normal appearing adrenal glands. Small bilateral renal cysts. These do not need imaging follow-up. Poorly distended urinary bladder. No ureteral abnormalities seen. Stomach/Bowel: Interval concentric mass involving the right colon and ileocecal valve measuring 7.3 cm in length on coronal image number 88/6 with a maximum wall thickness of 1.7 cm on the same image. This mass is confluent with an immediately adjacent mass medially with central low density, measuring 3.8 x 3.0 cm on image number 92/3 and 4.1 cm in length on coronal image number 83/6. There are multiple adjacent lymph node medial to these masses. The largest has a short axis diameter of 2.4 cm on image number 86/3. Multiple descending and sigmoid colon diverticula. Mild wall thickening involving the terminal ileum at the ileocecal valve. The remainder of the small bowel is unremarkable. Normal-appearing appendix and stomach. Vascular/Lymphatic: Mild atheromatous arterial calcifications without aneurysm. Multiple enlarged mesenteric lymph nodes medial to the right colon mass, as described above. No other enlarged lymph nodes seen. Reproductive: Prostate is unremarkable. Other: No abdominal wall hernia or abnormality. No abdominopelvic ascites. Musculoskeletal: Lumbar spine degenerative changes. Mild bilateral hip degenerative changes. No evidence of bony metastatic disease. IMPRESSION: 1. Interval concentric mass involving the right colon, ileocecal valve and distal terminal ileum, compatible with the patient's known colon carcinoma. No associated obstruction. 2. Adjacent mass with central low density, compatible with local extension of necrotic tumor or an enlarged, necrotic metastatic lymph node. 3. Multiple adjacent enlarged mesenteric lymph nodes medial to these masses, compatible with metastatic adenopathy. 4.  Colonic diverticulosis. 5. Cholelithiasis. 6. 4 mm mean diameter left lower lobe nodule possible 3 mm mean diameter right middle lobe nodule. These are felt to have a low likelihood of representing metastatic disease and could be followed on subsequent examinations. Electronically Signed   By: Claudie Revering M.D.   On: 09/18/2022 17:04    ECHO 04/2022 EF >55%, G1 DD with normal LV and RV systolic function  TELEMETRY reviewed by me (LT) 10/13/2022 : NSR rate 70s  EKG reviewed by me: 09/25/2022 sinus rhythm rate 60s  Data reviewed by me (LT) 10/13/2022: Surgery progress note, hospitalist progress note,  CBC, BMP, I's and O's, vitals, telemetry  Principal Problem:   Adenocarcinoma of colon ([place]) Active Problems:   Diabetes ([place])   Essential hypertension   Paroxysmal A-fib ([place])   S/P right colectomy   S/P partial resection of colon   Hyponatremia   AKI (acute kidney injury) ([place])   Postoperative intra-abdominal abscess   Persistent atrial fibrillation (HCC)   Septic shock (HCC)   Diabetic ketoacidosis without coma associated with type 2 diabetes mellitus (HCC)   Generalized anxiety disorder   Acute hypoxic respiratory failure (HCC)   Weakness   Microcytic anemia   Intra-abdominal abscess ([place])    ASSESSMENT AND PLAN:  Nicolas Zuniga is a 65yoM with a PMH of paroxysmal atrial fibrillation (on flecainide, metoprolol, and Eliquis), HFpEF (EF >55, G1 DD 04/2022), type II diabetes, hypertension, recently diagnosed adenocarcinoma of the colon who presented to [facility]/24/2022 for and elective laparoscopic right colectomy ultimately converted to open right colectomy and cholecystectomy.  Postoperative course was complicated by hypotension and bradycardia the evening  of postop day 1, concerning for beta-blocker toxicity for which he was started on glucagon infusion.  Rapid response was called overnight on 10/21 for atrial fibrillation with RVR and questionable VT, was started on amiodarone and diltiazem  infusions with some heart rate response.  On 10/23 he underwent exploratory laparotomy, abdominal washout, and diverting loop ileostomy for intra-abdominal purulent fluid seen on CT abdomen pelvis.  Postoperatively, he remained intubated and requiring vasopressor support.  He converted to atrial flutter with variable conduction the evening of 10/23, Extubated 10/24. He has remained in AF with acceptable rate control on amiodarone and metoprolol. Heparin infusion paused the AM of 10/30 for bloody ostomy output. Required reintubation 10/30, fortunately extubated to [place] on 10/31.  Converted from AF to NSR the morning of 10/31.  #Acute hypoxic & hypercapnic respiratory failure  -reintubated d/t increased wob and hypercapnia on 10/30, transferred to the ICU, extubated to nasal cannula 10/31. -encouraged continued use of incentive spirometer   #Adenocarcinoma of the colon s/p open R colectomy & CCY 10/19 #S/p exploratory laparotomy, abdominal washout, and diverting loop ileostomy 10/23 #Intra-abdominal abscess with ?Septic shock -Management per primary team and surgery -ID also involved to help guide treatment  #Hypotension, resolved #Paroxysmal atrial fibrillation with RVR, converted to NSR 10/31 Was previously on flecainide, metoprolol, and Eliquis which have all been held after rapid response on 10/21.  His heart rates were difficult to control, and was in A-fib in the 120s at rest with paroxysms to the 150s throughout the day of 10/23 and converted to atrial flutter with controlled ventricular response in the evening.  Bradycardic overnight so diltiazem and Precedex infusions were discontinued on 10/24.  Since 10/25 he has remained in atrial fibrillation with acceptable rate control in the 90s to low 100s primarily. Required reintubation and sedation 10/30, fortunately extubated and off vasopressor support 10/31.  Recurrent AF RVR on 11/3 has remained in sinus rhythm today 11/7. -Continue to treat causes  of increased adrenergic tone including pain, infection, hypoxia -Monitor and replete electrolytes to maintain a K >4, mag >2 -S/p amiodarone infusion, continue amiodarone 200 mg daily  -do not restart flecainide at discharge -continue metoprolol tartrate to '50mg'$  twice daily  -Revisit ablation on an outpatient basis with Dr. Mylinda Latina. -CHA2DS2-VASc 3 (age, htn, dm2).   S/p heparin infusion, restarted on Eliquis 11/6  #Chronic HFpEF (EF 65-70% 09/2022, previous >55% 04/2022) Net negative ~3.9 L today, appears somewhat volume up.  -s/p IV Lasix 40 mg once on 10/30, '20mg'$  IV 10/31 & 11/1.  #Acute renal failure, resolved Much improved with diuresis.  AKI likely ATN with significant hypotension requiring vasopressor support.   -Nephrology consulted by primary team, appreciate their assistance  #Acute blood loss anemia Hgb drift down overnight on 10/30 from 8.0 to 6.1, maroon ostomy output, s/p 1 unit of PRBCs.  -Hgb drift down again from 8.0 to 7.3 (reported scant blood from his bottom), continue to check H/H daily  This patient's plan of care was discussed and created with Dr. Clayborn Bigness and he is in agreement.  Signed: Tristan Schroeder , PA-C 10/13/2022, 4:15 PM [facility]

## 2022-10-13 NOTE — TOC Progression Note (Addendum)
Transition of Care [facility]) - Progression Note    Patient Details  Name: Nicolas Zuniga MRN: [id number] Date of Birth: 12/21/1955  [facility] CM/SW [place], LCSW Phone Number: 10/13/2022, 10:06 AM  Clinical Narrative:   Left voicemail for [facility] admissions dept to see if they had reviewed therapy notes that were sent over yesterday.  1:18 pm: Faxed MD notes to [facility] per admissions request.  Expected Discharge Plan: [place] Barriers to Discharge: Continued Medical Work up  Expected Discharge Plan and Services Expected Discharge Plan: [place]   Discharge Planning Services: CM Consult Post Acute Care Choice: [place] arrangements for the past 2 months: Single Family Home                                       Social Determinants of Health (SDOH) Interventions    Readmission Risk Interventions    09/30/2022    5:08 PM  Readmission Risk Prevention Plan  Transportation Screening Complete  PCP or Specialist Appt within 3-5 Days Complete  HRI or [place] Complete  Social Work Consult for [place] Planning/Counseling Complete  Palliative Care Screening Not Applicable  Medication Review [employer]) Complete

## 2022-10-14 ENCOUNTER — Inpatient Hospital Stay: Payer: Medicare Other

## 2022-10-14 DIAGNOSIS — C189 Malignant neoplasm of colon, unspecified: Secondary | ICD-10-CM | POA: Diagnosis not present

## 2022-10-14 LAB — BASIC METABOLIC PANEL
Anion gap: 6 (ref 5–15)
BUN: 22 mg/dL (ref 8–23)
CO2: 24 mmol/L (ref 22–32)
Calcium: 8.4 mg/dL — ABNORMAL LOW (ref 8.9–10.3)
Chloride: 107 mmol/L (ref 98–111)
Creatinine, Ser: 1.08 mg/dL (ref 0.61–1.24)
GFR, Estimated: >60 mL/min (ref >60)
Glucose, Bld: 176 mg/dL — ABNORMAL HIGH (ref 70–99)
Potassium: 4.2 mmol/L (ref 3.5–5.1)
Sodium: 137 mmol/L (ref 135–145)

## 2022-10-14 LAB — CBC WITH DIFFERENTIAL/PLATELET
Abs Immature Granulocytes: 0.16 10*3/uL — ABNORMAL HIGH (ref 0.00–0.07)
Basophils Absolute: 0.1 10*3/uL (ref 0.0–0.1)
Basophils Relative: 1 %
Eosinophils Absolute: 0.1 10*3/uL (ref 0.0–0.5)
Eosinophils Relative: 1 %
HCT: 28.1 % — ABNORMAL LOW (ref 39.0–52.0)
Hemoglobin: 8.7 g/dL — ABNORMAL LOW (ref 13.0–17.0)
Immature Granulocytes: 1 %
Lymphocytes Relative: 11 %
Lymphs Abs: 1.7 10*3/uL (ref 0.7–4.0)
MCH: 24.3 pg — ABNORMAL LOW (ref 26.0–34.0)
MCHC: 31 g/dL (ref 30.0–36.0)
MCV: 78.5 fL — ABNORMAL LOW (ref 80.0–100.0)
Monocytes Absolute: 1.3 10*3/uL — ABNORMAL HIGH (ref 0.1–1.0)
Monocytes Relative: 8 %
Neutro Abs: 12.8 10*3/uL — ABNORMAL HIGH (ref 1.7–7.7)
Neutrophils Relative %: 78 %
Platelets: 455 10*3/uL — ABNORMAL HIGH (ref 150–400)
RBC: 3.58 MIL/uL — ABNORMAL LOW (ref 4.22–5.81)
RDW: 18.2 % — ABNORMAL HIGH (ref 11.5–15.5)
WBC: 16.1 10*3/uL — ABNORMAL HIGH (ref 4.0–10.5)
nRBC: 0 % (ref 0.0–0.2)

## 2022-10-14 LAB — GLUCOSE, CAPILLARY
Glucose-Capillary: 151 mg/dL — ABNORMAL HIGH (ref 70–99)
Glucose-Capillary: 162 mg/dL — ABNORMAL HIGH (ref 70–99)
Glucose-Capillary: 172 mg/dL — ABNORMAL HIGH (ref 70–99)
Glucose-Capillary: 182 mg/dL — ABNORMAL HIGH (ref 70–99)
Glucose-Capillary: 216 mg/dL — ABNORMAL HIGH (ref 70–99)
Glucose-Capillary: 235 mg/dL — ABNORMAL HIGH (ref 70–99)

## 2022-10-14 LAB — VITAMIN B12: Vitamin B-12: 884 pg/mL (ref 180–914)

## 2022-10-14 LAB — IRON AND TIBC
Iron: 16 ug/dL — ABNORMAL LOW (ref 45–182)
Saturation Ratios: 9 % — ABNORMAL LOW (ref 17.9–39.5)
TIBC: 172 ug/dL — ABNORMAL LOW (ref 250–450)
UIBC: 156 ug/dL

## 2022-10-14 LAB — FOLATE: Folate: 11.1 ng/mL (ref >5.9)

## 2022-10-14 MED ORDER — IOHEXOL 9 MG/ML PO SOLN
500.0000 mL | ORAL | Status: AC
  Administered 2022-10-14 (×2): 500 mL via ORAL

## 2022-10-14 MED ORDER — LOPERAMIDE HCL 2 MG PO CAPS
4.0000 mg | ORAL_CAPSULE | Freq: Three times a day (TID) | ORAL | Status: DC
  Administered 2022-10-14 – 2022-10-19 (×16): 4 mg via ORAL
  Filled 2022-10-14 (×16): qty 2

## 2022-10-14 MED ORDER — LIDOCAINE 5 % EX PTCH
2.0000 | MEDICATED_PATCH | CUTANEOUS | Status: DC
  Administered 2022-10-14 – 2022-10-19 (×6): 2 via TRANSDERMAL
  Filled 2022-10-14 (×7): qty 2

## 2022-10-14 MED ORDER — IOHEXOL 300 MG/ML  SOLN
100.0000 mL | Freq: Once | INTRAMUSCULAR | Status: AC | PRN
  Administered 2022-10-14: 100 mL via INTRAVENOUS

## 2022-10-14 MED ORDER — LOPERAMIDE HCL 2 MG PO CAPS: 4.0000 mg | ORAL_CAPSULE | Freq: Two times a day (BID) | ORAL | Status: DC

## 2022-10-14 MED ORDER — APIXABAN 5 MG PO TABS
5.0000 mg | ORAL_TABLET | Freq: Two times a day (BID) | ORAL | Status: DC
  Administered 2022-10-14 – 2022-10-19 (×10): 5 mg via ORAL
  Filled 2022-10-14 (×10): qty 1

## 2022-10-14 NOTE — Progress Notes (Signed)
Nutrition Follow-up  DOCUMENTATION CODES:   Obesity unspecified  INTERVENTION:   -D/c calorie count -Continue MVI with minerals daily -Continue Magic cup TID with meals, each supplement provides 290 kcal and 9 grams of protein  -Continue Ensure Enlive po TID, each supplement provides 350 kcal and 20 grams of protein -Continue 30 ml Prosource Plus TID, each supplement provides 100 kcals and 15 grams protein  NUTRITION DIAGNOSIS:   Increased nutrient needs related to post-op healing as evidenced by estimated needs.  Ongoing  GOAL:   Patient will meet greater than or equal to 90% of their needs  Progressing   MONITOR:   PO intake, Supplement acceptance, Labs, Weight trends, TF tolerance, Skin, I & O's  REASON FOR ASSESSMENT:   Rounds    ASSESSMENT:   66 y/o male with h/o DM, HTN, Afib s/p cardioversion, anxiety, HLD, hiatal hernia, cholelithiasis and colon cancer (adenocarcinoma) s/p open right colectomy with ileocolic anastomosis and cholecystectomy 69/62 complicated by anastamotic leak s/p exploratory laparotomy and abdominal washout and diverting loop ileostomy 10/23. Pt also with new DKA and AKI. 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  Reviewed I/O's: -1.7 L x 24 hours and -14.4 L since 09/30/22  UOP: 1.3 L x 24 hours  Drain output: 20 ml x 24 hours  Ileostomy output: 600 ml x 24 hours   Spoke with pt and wife at bedside. Pt reports he is still not eating a lot, but intake continues to improve. Pt shares that his largest barriers are the sores in his mouth and taste changes; he shares that a lot of things taste salty. He also reports he is becoming tired of the hospital food secondary to limited choices. He is anxious to get NGT removed and thinks this well help with his discomfort in eating.   RD discussed importance of good meal and supplement intake to promote healing. Pt desires to return home and reinforced importance of  adequate calories and protein to help assist him in meeting goals and also be successful in possible cancer treatments. Pt shares that he made protein shakes at home with wife PTA and plans to resume this when he returns home.   Per pt wife, plan to d/c to [facility] tomorrow.   11/7 Breakfast: 170 kcals, 5 grams protein Lunch: 225 kcals. 4 grams protein Dinner: 15 kcals, 2 grams protein Supplements: 1 Ensure Enlive, 2 Prosource Plus   Total intake: 860 kcal (34% of minimum estimated needs)  61 protein (41% of minimum estimated needs)   Breakfast: 401 kcals, 25 grams protein Lunch: 359 kcals, 17 grams protein Dinner: 303 kcals, 18 grams protein Supplements: 1 Ensure Enlive, 3 Prosource Plus   Total intake: 1813 kcal (71% of minimum estimated needs)  105 grams protein (70% of minimum estimated needs)  Medications reviewed and include imodium.   Labs reviewed: 150-216 (inpatient orders for glycemic control are 0-5 units insulin aspart daily at bedtime, 0-9 units insulin aspart TID with meal,s and 10 units insulin detemir daily).     Diet Order:   Diet Order             DIET SOFT Room service appropriate? Yes; Fluid consistency: Thin  Diet effective now                   EDUCATION NEEDS:   No education needs have been identified at this time  Skin:  Skin Assessment: Skin Integrity Issues: Skin Integrity Issues:: DTI DTI: lt nare  Incisions: closed abdomen  Last BM:  10/14/22 (300 ml via ileostomy)  Height:   Ht Readings from Last 1 Encounters:  09/28/22 '6\' 2"'$  (1.88 m)    Weight:   Wt Readings from Last 1 Encounters:  10/14/22 120.6 kg    Ideal Body Weight:  86.4 kg  BMI:  Body mass index is 34.14 kg/m.  Estimated Nutritional Needs:   Kcal:  6861-6837  Protein:  150-175 grams  Fluid:  > 2 L    Loistine Chance, RD, LDN, [place] Registered Dietitian II Certified Diabetes Care and Education Specialist Please refer to [facility] for RD and/or RD  on-call/weekend/after hours pager

## 2022-10-14 NOTE — Progress Notes (Signed)
PROGRESS NOTE    Nicolas Zuniga  KDX:[id number] DOB: 1956/09/03 DOA: 09/24/2022 PCP: Hortencia Pilar, MD  [id number]  LOS: 20 days   Brief hospital course:   Assessment & Plan: Nicolas Zuniga is a [phone] with a PMH of paroxysmal atrial fibrillation (on flecainide, metoprolol, and Eliquis), HFpEF (EF >55, G1 DD 04/2022), type II diabetes, hypertension, recently diagnosed adenocarcinoma of the colon who presented to [facility] 09/24/2022 for and elective laparoscopic right colectomy ultimately converted to open right colectomy and cholecystectomy.    Postoperative course was complicated by hypotension and bradycardia the evening of postop day 1, concerning for beta-blocker toxicity for which he was started on glucagon infusion.  Rapid response was called overnight on 10/21 for atrial fibrillation with RVR and questionable VT, was started on amiodarone and diltiazem infusions with some heart rate response.  On 10/23 he underwent exploratory laparotomy, abdominal washout, and diverting loop ileostomy for intra-abdominal purulent fluid seen on CT abdomen pelvis.  Postoperatively, he remained intubated and requiring vasopressor support.  He converted to atrial flutter with variable conduction the evening of 10/23, Extubated 10/24. He has remained in AF with acceptable rate control on amiodarone and metoprolol. Heparin infusion paused the AM of 10/30 for bloody ostomy output. Required reintubation 10/30, fortunately extubated to [place] on 10/31.  Ongoing issues with AF RVR on 11/3, was restarted back on Eliquis.  Pending dispo to rehab versus CIR.    Adenocarcinoma of colon [facility]) large neuroendocrine tumor Right colon cancer status post laparoscopic right colectomy and cholecystectomy on 09/24/2022. Intra-abdominal infection status post exploratory laparotomy with abdominal washout and diverting loop ileostomy on 10/23 10/29 Meropenem started with persistent leukocytosis and Zosyn discontinued. 10/30 interventional  radiology did a CT aspiration of left anterior abdominal fluid collection and placed a drain for the  perihepatic fluid collection. --cont IV meropenem and Micafungin--per ID till 10/19/22 --cont soft diet --cont midline wound care --cont drain management   Septic shock ([place]) Septic shock has resolved.  Off pressors at this point.     Paroxysmal A-fib (HCC) --flecainide d/c'ed --cont amiodarone and metoprolol --cont Eliquis   AKI (acute kidney injury) ([place]) Creatinine peaked at 4.61 on 09/30/2022 improved down to 1.1   Generalized anxiety disorder --lexapro not on current home med list   Microcytic anemia --anemia workup   Weakness Continue working with physical therapy   Acute hypoxic respiratory failure [facility]) Patient required intubation during the hospital course.   Weaned to RA   Hyponatremia, resolved   Type 2 diabetes mellitus, poorly controlled Last hemoglobin A1c 9.8. --cont Levemir 10u daily --ACHS and SSI   Obesity, BMI 34   DVT prophylaxis: AL:[id number] Code Status: Full code  Family Communication: wife updated at bedside today Level of care: Telemetry Medical Dispo:   The patient is from: home Anticipated d/c is to: CIR Anticipated d/c date is: 1-2 days   Subjective and Interval History:  Tolerating diet.     Objective: Vitals:   10/13/22 2107 10/14/22 0449 10/14/22 0455 10/14/22 0825  BP: (!) 143/63  133/65 138/66  Pulse: 83  69 70  Resp: 18   18  Temp: 98.7 F (37.1 C)  97.9 F (36.6 C) 98 F (36.7 C)  TempSrc: Oral  Oral   SpO2: 98%   100%  Weight:  120.6 kg    Height:        Intake/Output Summary (Last 24 hours) at 10/14/2022 1627 Last data filed at 10/14/2022 1203 Gross per 24 hour  Intake 120  ml  Output 2000 ml  Net -1880 ml   Filed Weights   10/10/22 0300 10/11/22 0500 10/14/22 0449  Weight: 122.6 kg 123.6 kg 120.6 kg    Examination:   Constitutional: NAD, AAOx3 HEENT: conjunctivae and lids normal, EOMI CV: No  cyanosis.   RESP: normal respiratory effort, on RA Extremities: edema in BLE SKIN: warm, dry Neuro: II - XII grossly intact.   Psych: Normal mood and affect.  Appropriate judgement and reason   Data Reviewed: I have personally reviewed labs and imaging studies  Time spent: 50 minutes  Enzo Bi, MD [facility] If 7PM-7AM, please contact night-coverage 10/14/2022, 4:27 PM

## 2022-10-14 NOTE — TOC Progression Note (Addendum)
Transition of Care [facility]) - Progression Note    Patient Details  Name: Nicolas Zuniga MRN: [id number] Date of Birth: 08-Sep-1956  Transition of Care [facility]) CM/SW [place], LCSW Phone Number: 10/14/2022, 9:28 AM  Clinical Narrative:  Nicolas Zuniga to [name] at [facility]. He should have final determination regarding bed offer later today. If they can accept him they should have a bed tomorrow or Friday.  11:21 am: [facility] MD has questions about pain regimen, CT's, etc. Sent contact information to attending and surgical MD and PA.  Expected Discharge Plan: [place] Barriers to Discharge: Continued Medical Work up  Expected Discharge Plan and Services Expected Discharge Plan: [place]   Discharge Planning Services: CM Consult Post Acute Care Choice: [place] Living arrangements for the past 2 months: Single Family Home                                       Social Determinants of Health (SDOH) Interventions    Readmission Risk Interventions    09/30/2022    5:08 PM  Readmission Risk Prevention Plan  Transportation Screening Complete  PCP or Specialist Appt within 3-5 Days Complete  HRI or [place] Complete  Social Work Consult for [place] Planning/Counseling Complete  Palliative Care Screening Not Applicable  Medication Review [employer]) Complete

## 2022-10-14 NOTE — Progress Notes (Signed)
Occupational Therapy Treatment Patient Details Name: Nicolas Zuniga MRN: [id number] DOB: 10-Mar-1956 Today's Date: 10/14/2022   History of present illness Pt is a 66 y.o. male s/p exploratory laparotomy, abdominal washout, and diverting loop ileostomy for intra-abdominal purulent fluid after initial open right colectomy with open cholecystectomy for right colon cancer and cholelithiasis on 10/19, RUQ drain placed recently then intubated, extubated 10/31. MD assessment includes: Adenocarcinoma of the colon, septic shock, A-fib, AKI, generalized anxiety disorder, anemia, weakness, acute hypoxic respiratory failure, and hyponatremia.   OT comments  Nicolas Zuniga was seen for OT treatment on this date. Upon arrival to room pt reclined in bed, agreeable to tx. Pt requires MIN A exit bed, assist for trunk, good sitting balance. CGA + RW bed>chair t/f with fair standing balance, tolerates ~3 min functional reaching activities. Pt completed seated yellow theraband exercises as described below with cues for technique; good motivation. Completes ~6 ft forwards/backwards steps with CGA + RW, noted to have ostomy site leaking and returned to sitting. RN notified and in room to assess. Pt making good progress toward goals, will continue to follow POC. Discharge recommendation remains appropriate.     Recommendations for follow up therapy are one component of a multi-disciplinary discharge planning process, led by the attending physician.  Recommendations may be updated based on patient status, additional functional criteria and insurance authorization.    Follow Up Recommendations  Acute inpatient rehab (3hours/day)    Assistance Recommended at Discharge Frequent or constant Supervision/Assistance  Patient can return home with the following  A lot of help with walking and/or transfers;A lot of help with bathing/dressing/bathroom;Assistance with cooking/housework;Direct supervision/assist for medications  management;Direct supervision/assist for financial management;Assist for transportation;Help with stairs or ramp for entrance   Equipment Recommendations  Other (comment) (defer)    Recommendations for Other Services      Precautions / Restrictions Precautions Precautions: Fall Restrictions Weight Bearing Restrictions: No Other Position/Activity Restrictions: Multiple JP drains and ostomy bag       Mobility Bed Mobility Overal bed mobility: Needs Assistance Bed Mobility: Supine to Sit     Supine to sit: Min assist     General bed mobility comments: assist for trunk    Transfers Overall transfer level: Needs assistance Equipment used: Rolling walker (2 wheels) Transfers: Sit to/from Stand, Bed to chair/wheelchair/BSC Sit to Stand: Min guard     Step pivot transfers: Min guard     General transfer comment: tolerates ~6 ft forward/backward     Balance Overall balance assessment: Needs assistance Sitting-balance support: Feet supported Sitting balance-Leahy Scale: Good     Standing balance support: Single extremity supported, During functional activity Standing balance-Leahy Scale: Fair                             ADL either performed or assessed with clinical judgement   ADL Overall ADL's : Needs assistance/impaired                                       General ADL Comments: MAX A don B socks seated EOB. CGA + RW for simulated toilet t/f      Cognition Arousal/Alertness: Awake/alert Behavior During Therapy: WFL for tasks assessed/performed Overall Cognitive Status: Within Functional Limits for tasks assessed  Exercises General Exercises - Upper Extremity Shoulder Horizontal ABduction: AROM, Theraband, Strengthening, Both, 10 reps, Seated Theraband Level (Shoulder Horizontal Abduction): Level 1 (Yellow) Shoulder Horizontal ADduction: AROM, Theraband, Strengthening,  Both, 10 reps, Seated Theraband Level (Shoulder Horizontal Adduction): Level 1 (Yellow) Elbow Flexion: AROM, Theraband, Strengthening, Both, 10 reps, Seated Theraband Level (Elbow Flexion): Level 1 (Yellow) Elbow Extension: AROM, Theraband, Strengthening, Both, 10 reps, Seated Theraband Level (Elbow Extension): Level 1 (Yellow)            Pertinent Vitals/ Pain       Pain Assessment Pain Assessment: Faces Faces Pain Scale: Hurts a little bit Pain Location: abdomen, right side Pain Descriptors / Indicators: Discomfort, Operative site guarding, Sore Pain Intervention(s): Limited activity within patient's tolerance, Repositioned, Premedicated before session   Frequency  Min 3X/week        Progress Toward Goals  OT Goals(current goals can now be found in the care plan section)  Progress towards OT goals: Progressing toward goals  Acute Rehab OT Goals Patient Stated Goal: to walk OT Goal Formulation: With patient/family Time For Goal Achievement: 10/21/22 Potential to Achieve Goals: Good ADL Goals Pt Will Perform Grooming: with min assist;standing Pt Will Perform Lower Body Dressing: with min assist;sit to/from stand;with caregiver independent in assisting Pt Will Transfer to Toilet: with min assist;ambulating;bedside commode  Plan Discharge plan remains appropriate;Frequency remains appropriate    Co-evaluation                 AM-PAC OT "6 Clicks" Daily Activity     Outcome Measure   Help from another person eating meals?: None Help from another person taking care of personal grooming?: A Little Help from another person toileting, which includes using toliet, bedpan, or urinal?: A Lot Help from another person bathing (including washing, rinsing, drying)?: A Lot Help from another person to put on and taking off regular upper body clothing?: A Little Help from another person to put on and taking off regular lower body clothing?: A Lot 6 Click Score: 16     End of Session Equipment Utilized During Treatment: Rolling walker (2 wheels)  OT Visit Diagnosis: Other abnormalities of gait and mobility (R26.89);Muscle weakness (generalized) (M62.81)   Activity Tolerance Patient tolerated treatment well   Patient Left in chair;with call bell/phone within reach;with nursing/sitter in room;with family/visitor present   Nurse Communication          Time: 4665-9935 OT Time Calculation (min): 39 min  Charges: OT General Charges $OT Visit: 1 Visit OT Treatments $Self Care/Home Management : 8-22 mins $Therapeutic Activity: 8-22 mins $Therapeutic Exercise: 8-22 mins  Dessie Coma, M.S. OTR/L  10/14/22, 3:26 PM  ascom [phone]

## 2022-10-14 NOTE — Progress Notes (Addendum)
[place] Hospital Day(s): 20.   Post op day(s): 16 Days Post-Op.   Interval History:  Patient seen and examined Nothing acute overnight  Patient reports he is doing better; no significant complaints this AM No abdominal pain, nausea, emesis  His leukocytosis slightly up this AM; 16.1K (from 14.6K); this does fluctuate from time to time Hgb seems stable in last 72 hours; 8.7 Renal function normal; sCr - 1.08; UO - 1300 No electrolyte derangements  Surgical drain with minimal output; serous Percutaneous drain (RUQ); unmeasured; serous Cx x3 from (10/30) are without bacteria; few candida albicans on left posterior Cx  He continues on Meropenem and micafungin; ID on-board He continues to have ostomy function; 600 ccs recorded + unmeasured Enteric feedings held On soft diet; tolerating better  Vital signs in last 24 hours: [min-max] current  Temp:  [97.9 F (36.6 C)-98.7 F (37.1 C)] 97.9 F (36.6 C) (11/08 0455) Pulse Rate:  [68-83] 69 (11/08 0455) Resp:  [17-18] 18 (11/07 2107) BP: (121-144)/(63-69) 133/65 (11/08 0455) SpO2:  [96 %-98 %] 98 % (11/07 2107) Weight:  [120.6 kg] 120.6 kg (11/08 0449)     Height: '6\' 2"'$  (188 cm) Weight: 120.6 kg BMI (Calculated): 34.12   Intake/Output last 2 shifts:  11/07 0701 - 11/08 0700 In: 225 [P.O.:120; IV Piggyback:105] Out: 1920 [Urine:1300; Drains:20; Stool:600]   Physical Exam:  Constitutional: Alert, awake, NAD Respiratory: Breathing non-labor, no respiratory distress, on RA Cardiac: regular rate, regular rhythm  Gastrointestinal: Soft, no abdominal pain, non-distended, no rebound/guarding. Surgical drain with serosanguinous output. 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 Newly placed drain in RUQ; serous fluid Integumentary: Midline laparotomy healing via secondary intention, no erythema, no drainage, no evidence of fistula  Labs:      Latest Ref Rng & Units 10/14/2022    6:19 AM 10/13/2022    6:09 AM 10/12/2022    4:28 AM  CBC  WBC 4.0 - 10.5 K/uL 16.1  14.6  17.1   Hemoglobin 13.0 - 17.0 g/dL 8.7  8.4  8.3   Hematocrit 39.0 - 52.0 % 28.1  26.9  26.0   Platelets 150 - 400 K/uL 455  450  450       Latest Ref Rng & Units 10/14/2022    6:19 AM 10/12/2022    4:28 AM 10/11/2022    4:35 AM  CMP  Glucose 70 - 99 mg/dL 176  155  94   BUN 8 - 23 mg/dL '22  27  30   '$ Creatinine 0.61 - 1.24 mg/dL 1.08  1.14  1.15   Sodium 135 - 145 mmol/L 137  135  137   Potassium 3.5 - 5.1 mmol/L 4.2  4.3  4.6   Chloride 98 - 111 mmol/L 107  106  108   CO2 22 - 32 mmol/L '24  23  24   '$ Calcium 8.9 - 10.3 mg/dL 8.4  8.3  8.4      Imaging studies:   CT Abdomen/Pelvis (10/14/2022) personally reviewed which shows non-specific fluid in LUQ, no free air, no contrast extravasation, and radiologist report pending...   Assessment/Plan:  66 y.o. male doing much better 16 Days Post-Op s/p exploratory laparotomy, abdominal washout, and diverting loop ileostomy for intra-abdominal purulent fluid after initial open right colectomy with open cholecystectomy for right colon cancer and cholelithiasis on 10/19    - I think we can go ahead and remove NGT today             -  Continue soft diet + nutritional supplementation              - Monitor leukocytosis & Monitor H&H; stable             - Continue IV Abx (Meropenem); also on Micafungin; ID following             - Follow up 10/30 Cx; no growth to date             - Monitor abdominal examination - Monitor on-going ileostomy function; imodium started 11/02... Increased dose to 4 mg given looser stool              - Midline wound care: Pack daily with saline moistened gauze, cover, secure             - Continue surgical drain; monitor and record output  - continue percutaneous drain (10/31); monitor and record output              - Pain control prn; antiemetics prn             - Appreciate cardiology  assistance/recommendations   - Oncology on board as well             - Therapies on board; recommending inpatient rehab             - Appreciate medicine assistance as well                - Discharge Planning; Making improvements, CT reassuring today. We may be able to pursue discharge at the end of the week.   All of the above findings and recommendations were discussed with the patient, and the medical team, and all of patient's questions were answered to his expressed satisfaction.  -- Edison Simon, PA-C [facility] 10/14/2022, 7:22 AM M-F: 7am - 4pm

## 2022-10-14 NOTE — Progress Notes (Signed)
Physical Therapy Treatment Patient Details Name: Nicolas Zuniga MRN: [id number] DOB: 10/07/1956 Today's Date: 10/14/2022   History of Present Illness Pt is a 66 y.o. male s/p exploratory laparotomy, abdominal washout, and diverting loop ileostomy for intra-abdominal purulent fluid after initial open right colectomy with open cholecystectomy for right colon cancer and cholelithiasis on 10/19, RUQ drain placed recently then intubated, extubated 10/31. MD assessment includes: Adenocarcinoma of the colon, septic shock, A-fib, AKI, generalized anxiety disorder, anemia, weakness, acute hypoxic respiratory failure, and hyponatremia.    PT Comments    Pt fatigued from sitting in recliner 30 to 45 min and requesting back to bed.  Pt required cues for sequencing during transfer from recliner but no physical assist and was able to ambulate a max of 3 feet from chair to bed and declined attempt at additional ambulation. Pt required min A for BLE management during log roll training/review and once back in bed declined further PT.  Offered low intensity directed supine therex with pt again declining secondary to abdominal pain and fatigue.  Will continue to recommend IR at discharge at this time with the expectation that patient's activity tolerance will improve while in acute care but will monitor closely for possible recommendation change if pt does not progress as expected.     Recommendations for follow up therapy are one component of a multi-disciplinary discharge planning process, led by the attending physician.  Recommendations may be updated based on patient status, additional functional criteria and insurance authorization.  Follow Up Recommendations  Acute inpatient rehab (3hours/day)     Assistance Recommended at Discharge Frequent or constant Supervision/Assistance  Patient can return home with the following A lot of help with walking and/or transfers;A lot of help with bathing/dressing/bathroom;Help  with stairs or ramp for entrance;Direct supervision/assist for medications management;Assist for transportation   Equipment Recommendations  Rolling walker (2 wheels)    Recommendations for Other Services       Precautions / Restrictions Precautions Precautions: Fall Restrictions Weight Bearing Restrictions: No Other Position/Activity Restrictions: Multiple JP drains and ostomy bag     Mobility  Bed Mobility   Bed Mobility: Sit to Sidelying         Sit to sidelying: Mod assist General bed mobility comments: Log roll training with mod A for BLE management during sit to sidelying    Transfers Overall transfer level: Needs assistance Equipment used: Rolling walker (2 wheels) Transfers: Sit to/from Stand Sit to Stand: Min guard, From elevated surface           General transfer comment: Min verbal cues for hand placement    Ambulation/Gait Ambulation/Gait assistance: Min guard Gait Distance (Feet): 3 Feet Assistive device: Rolling walker (2 wheels) Gait Pattern/deviations: Step-to pattern, Decreased step length - right, Decreased step length - left, Trunk flexed Gait velocity: decreased     General Gait Details: Slow cadence with short B step length but steady without LOB   Stairs             Wheelchair Mobility    Modified Rankin (Stroke Patients Only)       Balance Overall balance assessment: Needs assistance Sitting-balance support: Feet supported Sitting balance-Leahy Scale: Good     Standing balance support: Bilateral upper extremity supported, During functional activity, Reliant on assistive device for balance Standing balance-Leahy Scale: [place]  Arousal/Alertness: Awake/alert Behavior During Therapy: Flat affect Overall Cognitive Status: Within Functional Limits for tasks assessed                                          Exercises      General Comments         Pertinent Vitals/Pain Pain Assessment Pain Assessment: 0-10 Pain Score: 5  Pain Location: abdomen, right side Pain Descriptors / Indicators: Discomfort, Operative site guarding, Sore Pain Intervention(s): Premedicated before session, Monitored during session, Repositioned    Home Living                          Prior Function            PT Goals (current goals can now be found in the care plan section) Progress towards PT goals: Not progressing toward goals - comment (limited by pain and poor activity tolerance)    Frequency    Min 2X/week      PT Plan Current plan remains appropriate    Co-evaluation              AM-PAC PT "6 Clicks" Mobility   Outcome Measure  Help needed turning from your back to your side while in a flat bed without using bedrails?: A Little Help needed moving from lying on your back to sitting on the side of a flat bed without using bedrails?: A Lot Help needed moving to and from a bed to a chair (including a wheelchair)?: A Lot Help needed standing up from a chair using your arms (e.g., wheelchair or bedside chair)?: A Lot Help needed to walk in hospital room?: A Lot Help needed climbing 3-5 steps with a railing? : Total 6 Click Score: 12    End of Session Equipment Utilized During Treatment: Gait belt Activity Tolerance: Patient limited by fatigue;No increased pain Patient left: in bed;with call bell/phone within reach;with bed alarm set;with family/visitor present Nurse Communication: Mobility status PT Visit Diagnosis: Muscle weakness (generalized) (M62.81);Pain;Difficulty in walking, not elsewhere classified (R26.2) Pain - Right/Left: Right     Time: 7048-8891 PT Time Calculation (min) (ACUTE ONLY): 18 min  Charges:  $Therapeutic Activity: 8-22 mins                     D. Scott Nixxon Faria PT, DPT 10/14/22, 4:04 PM

## 2022-10-15 DIAGNOSIS — C189 Malignant neoplasm of colon, unspecified: Secondary | ICD-10-CM | POA: Diagnosis not present

## 2022-10-15 LAB — CBC
HCT: 28 % — ABNORMAL LOW (ref 39.0–52.0)
Hemoglobin: 8.8 g/dL — ABNORMAL LOW (ref 13.0–17.0)
MCH: 24.6 pg — ABNORMAL LOW (ref 26.0–34.0)
MCHC: 31.4 g/dL (ref 30.0–36.0)
MCV: 78.2 fL — ABNORMAL LOW (ref 80.0–100.0)
Platelets: 432 10*3/uL — ABNORMAL HIGH (ref 150–400)
RBC: 3.58 MIL/uL — ABNORMAL LOW (ref 4.22–5.81)
RDW: 18.4 % — ABNORMAL HIGH (ref 11.5–15.5)
WBC: 15.4 10*3/uL — ABNORMAL HIGH (ref 4.0–10.5)
nRBC: 0 % (ref 0.0–0.2)

## 2022-10-15 LAB — BASIC METABOLIC PANEL
Anion gap: 7 (ref 5–15)
BUN: 19 mg/dL (ref 8–23)
CO2: 24 mmol/L (ref 22–32)
Calcium: 8.5 mg/dL — ABNORMAL LOW (ref 8.9–10.3)
Chloride: 105 mmol/L (ref 98–111)
Creatinine, Ser: 0.92 mg/dL (ref 0.61–1.24)
GFR, Estimated: >60 mL/min (ref >60)
Glucose, Bld: 183 mg/dL — ABNORMAL HIGH (ref 70–99)
Potassium: 4.2 mmol/L (ref 3.5–5.1)
Sodium: 136 mmol/L (ref 135–145)

## 2022-10-15 LAB — GLUCOSE, CAPILLARY
Glucose-Capillary: 185 mg/dL — ABNORMAL HIGH (ref 70–99)
Glucose-Capillary: 170 mg/dL — ABNORMAL HIGH (ref 70–99)
Glucose-Capillary: 189 mg/dL — ABNORMAL HIGH (ref 70–99)
Glucose-Capillary: 196 mg/dL — ABNORMAL HIGH (ref 70–99)
Glucose-Capillary: 189 mg/dL — ABNORMAL HIGH (ref 70–99)

## 2022-10-15 LAB — MAGNESIUM: Magnesium: 1.8 mg/dL (ref 1.7–2.4)

## 2022-10-15 LAB — PHOSPHORUS: Phosphorus: 2.9 mg/dL (ref 2.5–4.6)

## 2022-10-15 MED ORDER — SODIUM CHLORIDE 0.9 % IV SOLN
200.0000 mg | Freq: Every day | INTRAVENOUS | Status: AC
  Administered 2022-10-15 – 2022-10-17 (×3): 200 mg via INTRAVENOUS
  Filled 2022-10-15 (×3): qty 200

## 2022-10-15 MED ORDER — MORPHINE SULFATE (PF) 2 MG/ML IV SOLN
2.0000 mg | Freq: Every day | INTRAVENOUS | Status: DC | PRN
  Administered 2022-10-19: 2 mg via INTRAVENOUS
  Filled 2022-10-15: qty 1

## 2022-10-15 NOTE — Discharge Summary (Addendum)
[facility] SURGICAL DISCHARGE SUMMARY   Patient ID: Nicolas Zuniga MRN: [id number] DOB/AGE: May 17, 1956 66 y.o.  Admit date: 09/24/2022 Discharge date: 10/15/2022  Discharge Diagnoses Patient Active Problem List   Diagnosis Date Noted   Intra-abdominal abscess [facility])    Acute hypoxic respiratory failure ([place]) 10/05/2022   Weakness 10/05/2022   Microcytic anemia 10/05/2022   Generalized anxiety disorder 10/04/2022   Diabetic ketoacidosis without coma associated with type 2 diabetes mellitus ([place])    Postoperative intra-abdominal abscess 09/28/2022   Adenocarcinoma of colon (HCC)    Persistent atrial fibrillation (HCC)    Septic shock (HCC)    Hyponatremia 09/25/2022   AKI (acute kidney injury) ([place]) 09/25/2022   S/P right colectomy 09/24/2022   S/P partial resection of colon 09/24/2022   Change in bowel habits    Colonic mass    Polyp of transverse colon    Diabetes mellitus type 2, uncomplicated ([place]) 94/70/9628   Kidney stones 04/30/2020   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   Bradycardia 12/30/2018   Paroxysmal A-fib ([place]) 12/14/2018   Non compliance with medical treatment 04/13/2018   History of atrial fibrillation 07/04/2012   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   Diabetes ([place]) 03/08/2007   HYPERLIPIDEMIA 03/08/2007   ANXIETY 03/08/2007   Essential hypertension 03/08/2007   HX, PERSONAL, URINARY CALCULI 03/08/2007    Consultants Medicine Cardiology PCCM Interventional radiology  Oncology  Nephrology  Infectious disease   Procedures 09/24/2022 - Dr Dahlia Byes 1. Attempted Hand assisted Laparoscopic Right colectomy converted to Open Right colectomy with ileocolic anastomosis 2. Open cholecystectomy  09/28/2022 - Dr Dahlia Byes Exploratory laparotomy and abdominal washout; Placement of 19 blake drain; Diverting loop ileostomy   HPI: Nicolas Zuniga is a 66 y.o. male with history of adenocarcinoma of the right colon with partial  obstruction as well as cholelithiasis who presents to [facility] on 10/19 for scheduled resection and cholecystectomy with Dr Dahlia Byes.   Hospital Course: Informed consent was obtained and documented, and patient underwent attempted hand assisted laparoscopic right colectomy converted to open with open cholecystectomy (Dr Dahlia Byes, 09/24/2022). Had initial issues with hypotension and bradycardia on POD1 but this responded to resuscitation. However, he continued to deteriorate over the course of POD2-3 and developed atrial fibrillation with RVR. Medicine was brought on board to assist. Ultimately, he did require takeback on 09/28/2022 for exploration, washout, and diverting loop ileostomy. Intra-operatively there was no evidence of anastomotic leak nor missed bowel injury. All that was encountered was murky fluid in the RUQ. He did require intubation and ICU stay post-operatively. Cardiology also consulted at this time as well. Following his second procedure he did also develop an AKI and nephrology was consulted. He never required CRRT nor HD. Initial NGT was removed on 10/26 after passing clamping trial and diet was started. He did have a rising leukocytosis after second procedure and repeat CT Abdomen/Pelvis was obtained on 10/28 which was concerning for free fluid around the liver and in scattered places throughout his abdomen. Interventional radiology was consulted and he underwent percutaneous drain placement into the RUQ collection on 10/30 and aspiration x2 of other intra-abdominal collections. Cx from this were without growth. Unfortunately, the night of 10/30 he declined from a respiratory perspective and was re-intubated and require vasopressor support. NGT was replaced and enteric feeding was initiate. Fortunately, he was able to be extubated on 10/31. He continued to make slow progress throughout the week. Did require transfusion of 1 unit pRBCs on 11/03. On the evening of 11/03,  he deteriorated again and  progressed back into Atrial fibrillation with RVR. He was transferred again to step-down/ICU. Another CT Abdomen/Pelvis at this time was obtained and was reassuring without intra-abdominal catastrophe. Chromagranin A was sent to rule out carcinoid crisis; oncology following at this time as well. Fortunately, he made marked improvements on 11/04 and continued to make good progress from this point forward. Diet was gradually advanced and he was weaned from enteric feedings. CT Abdomen/Pelvis was repeated on 11/07 which was reassuring. He did have non-specific LUQ fluid but after discussion with IR we did not pursue drainage. His leukocytosis continued to make gradual improvements as well. His surgical drain was removed on 11/09 and wound vac was placed as well. His remaining drain was removed on 11/10. Throughout his admission he was seen by PT/OT who recommended AIR/CIR. He was accepted at [facility].   Infectious Disease Recommendations: Antibiotics: Meropenem, stop date 11/13 Antifungals: Micafungin; stop date 11/13 Okay to remove PICC on completion of these if this is no longer needed  Cardiology Recommendations:  Continue amiodarone 200 mg daily  Do not restart flecainide at discharge Continue metoprolol tartrate to '50mg'$  twice daily  Revisit ablation on an outpatient basis with Dr. Mylinda Latina. Continue Eliquis 5 mg BID  General Surgery Recommendations:  Okay to continue regular diet + nutritional supplementation Continue Imodium 4 mg BID for ileostomy output control; titrate as needed  Continue midline wound vac; MWF schedule  DO NOT RESTART SSRI   Follow Up:  Once discharged from AIR, he should follow up with general surgery in 1-2 weeks. Please call at any time with questions/concerns.    Discharge Condition: Good   Physical Examination on day of Discharge:  Constitutional: Alert, awake, NAD Respiratory: Breathing non-labor, no respiratory distress, on RA Cardiac: regular rate, regular  rhythm  Gastrointestinal: Soft, no abdominal pain, non-distended, no rebound/guarding. 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 Removed last RUQ drain.  Integumentary: Midline laparotomy now with wound vac; good seal.     Time spent on discharge management including discussion of hospital course, clinical condition, outpatient instructions, prescriptions, and follow up with the patient and members of the medical team: >30 minutes  -- Edison Simon , PA-C [facility]  10/15/2022, 3:56 PM [phone] M-F: 7am - 4pm

## 2022-10-15 NOTE — Progress Notes (Signed)
Physical Therapy Treatment Patient Details Name: KYJUAN GAUSE MRN: [id number] DOB: 05/11/1956 Today's Date: 10/15/2022   History of Present Illness Pt is a 66 y.o. male s/p exploratory laparotomy, abdominal washout, and diverting loop ileostomy for intra-abdominal purulent fluid after initial open right colectomy with open cholecystectomy for right colon cancer and cholelithiasis on 10/19, RUQ drain placed recently then intubated, extubated 10/31. MD assessment includes: Adenocarcinoma of the colon, septic shock, A-fib, AKI, generalized anxiety disorder, anemia, weakness, acute hypoxic respiratory failure, and hyponatremia. Has abdominal wound vac now.    PT Comments    Patient received in bed, agrees to PT session. Patient states "I want to do something." He is able to roll to R side with min A, requires mod A for trunk elevation to get fully sitting EOB. Patient is able to stand from elevated surface after 2 attempts with mod A. He ambulated 12 feet with RW and min guard. Patient limited by fatigue/weakness. He will continue to benefit from consistent therapy to progress tolerance for potential CIR admission.        Recommendations for follow up therapy are one component of a multi-disciplinary discharge planning process, led by the attending physician.  Recommendations may be updated based on patient status, additional functional criteria and insurance authorization.  Follow Up Recommendations  Acute inpatient rehab (3hours/day)     Assistance Recommended at Discharge Frequent or constant Supervision/Assistance  Patient can return home with the following A lot of help with bathing/dressing/bathroom;Help with stairs or ramp for entrance;Direct supervision/assist for medications management;Assist for transportation;A lot of help with walking and/or transfers   Equipment Recommendations  Rolling walker (2 wheels)    Recommendations for Other Services Rehab consult     Precautions /  Restrictions Precautions Precautions: Fall Restrictions Weight Bearing Restrictions: No Other Position/Activity Restrictions: single JP drain, ostomy bag, wound vac     Mobility  Bed Mobility Overal bed mobility: Needs Assistance Bed Mobility: Rolling, Supine to Sit Rolling: Min guard Sidelying to sit: Mod assist       General bed mobility comments: Log roll training with mod A for trunk elevation sidelying to sit    Transfers Overall transfer level: Needs assistance Equipment used: Rolling walker (2 wheels) Transfers: Sit to/from Stand Sit to Stand: From elevated surface, Min assist           General transfer comment: min A fir sit to stand power up from elevated bed.    Ambulation/Gait Ambulation/Gait assistance: Min guard Gait Distance (Feet): 12 Feet Assistive device: Rolling walker (2 wheels) Gait Pattern/deviations: Step-through pattern, Decreased step length - right, Decreased step length - left Gait velocity: decreased     General Gait Details: fair balance with RW, min guard.  Limited by weakness, fatigue   Stairs             Wheelchair Mobility    Modified Rankin (Stroke Patients Only)       Balance Overall balance assessment: Needs assistance Sitting-balance support: Feet supported Sitting balance-Leahy Scale: Good Sitting balance - Comments: supervision   Standing balance support: Bilateral upper extremity supported, During functional activity, Reliant on assistive device for balance Standing balance-Leahy Scale: Fair                              Cognition Arousal/Alertness: Awake/alert Behavior During Therapy: WFL for tasks assessed/performed Overall Cognitive Status: Within Functional Limits for tasks assessed  General Comments: improved this date. Much more awake, conversive        Exercises      General Comments        Pertinent Vitals/Pain Pain  Assessment Pain Assessment: Faces Faces Pain Scale: Hurts little more Pain Location: abdomen, right side Pain Descriptors / Indicators: Discomfort, Operative site guarding, Sore Pain Intervention(s): Monitored during session, Repositioned    Home Living                          Prior Function            PT Goals (current goals can now be found in the care plan section) Acute Rehab PT Goals Patient Stated Goal: To get stronger PT Goal Formulation: With patient/family Time For Goal Achievement: 10/25/22 Potential to Achieve Goals: Good Progress towards PT goals: Progressing toward goals    Frequency           PT Plan Current plan remains appropriate    Co-evaluation              AM-PAC PT "6 Clicks" Mobility   Outcome Measure  Help needed turning from your back to your side while in a flat bed without using bedrails?: A Little Help needed moving from lying on your back to sitting on the side of a flat bed without using bedrails?: A Lot Help needed moving to and from a bed to a chair (including a wheelchair)?: A Little Help needed standing up from a chair using your arms (e.g., wheelchair or bedside chair)?: A Lot Help needed to walk in hospital room?: A Little Help needed climbing 3-5 steps with a railing? : Total 6 Click Score: 14    End of Session Equipment Utilized During Treatment: Gait belt Activity Tolerance: Patient limited by fatigue Patient left: in chair;with call bell/phone within reach;with chair alarm set;with nursing/sitter in room Nurse Communication: Mobility status PT Visit Diagnosis: Muscle weakness (generalized) (M62.81);Pain;Difficulty in walking, not elsewhere classified (R26.2) Pain - Right/Left: Right Pain - part of body:  (abdomen)     Time: 1540-0867 PT Time Calculation (min) (ACUTE ONLY): 18 min  Charges:  $Gait Training: 8-22 mins                     [employer], PT, GCS 10/15/22,2:12 PM

## 2022-10-15 NOTE — Consult Note (Signed)
[place] Nurse Consult Note: Patient receiving care in [facility] 223. Spouse and primary care RN present at time of [facility] and ostomy care session. Reason for Consult: VAC placement to abdominal wound Wound type: surgical Pressure Injury POA: Yes/No/NA Measurement: 15.8 cm x 4.9 cm x 3.5 cm Wound bed: 25 red; 75 % yellow and brown with visible sutures in wound base Drainage (amount, consistency, odor) serosanginous going into TRAC pad tubing Periwound: intact Dressing procedure/placement/frequency: One piece of black foam placed into wound. Drape applied, immediate seal obtained. Patient tolerated well.  [place] Nurse ostomy follow up Stoma type/location: RUQ loop ileostomy Stomal assessment/size: oval, 1.5 inches top to bottom; 1 3/4 inches side to side; red, moist, no mucocutaneous separation. Peristomal assessment: intact Treatment options for stomal/peristomal skin: barrier ring Output: brown pudding consistency Ostomy pouching: 2pc. 2 and 3/4 inch system Education provided: the entire emptying, removal, cleaning, new system preparation and application processes demonstrated to spouse, patient, primary RN. Enrolled patient in [place] Discharge program: Yes today.  Val Riles, RN, MSN, CWOCN, CNS-BC, pager [phone]

## 2022-10-15 NOTE — Progress Notes (Signed)
[place] Hospital Day(s): 21.   Post op day(s): 17 Days Post-Op.   Interval History:  Patient seen and examined Nothing acute overnight  Patient reports he is feeling good this morning No abdominal pain, nausea, emesis  His leukocytosis improved this morning; 15.4K (from 16.1K); this does fluctuate from time to time Hgb stable; 8.8 Renal function normal; sCr - 0.92; UO - 1200 No electrolyte derangements  Surgical drain with minimal output; serous Percutaneous drain (RUQ); 7 ccs; serous Cx x3 from (10/30) are without bacteria; few candida albicans on left posterior Cx  He continues on Meropenem and micafungin; ID on-board He continues to have ostomy function; 525 ccs recorded + unmeasured On soft diet; tolerating better Discharge plan for CIR; pending   Vital signs in last 24 hours: [min-max] current  Temp:  [97.8 F (36.6 C)-98.8 F (37.1 C)] 97.8 F (36.6 C) (11/09 0416) Pulse Rate:  [68-77] 68 (11/09 0416) Resp:  [18] 18 (11/09 0416) BP: (121-131)/(52-67) 121/64 (11/09 0416) SpO2:  [97 %-100 %] 97 % (11/09 0416) Weight:  [270 kg] 128 kg (11/09 0500)     Height: '6\' 2"'$  (188 cm) Weight: 128 kg BMI (Calculated): 36.22   Intake/Output last 2 shifts:  11/08 0701 - 11/09 0700 In: 250 [P.O.:240] Out: 1742 [Urine:1200; Drains:17; Stool:525]   Physical Exam:  Constitutional: Alert, awake, NAD Respiratory: Breathing non-labor, no respiratory distress, on RA Cardiac: regular rate, regular rhythm  Gastrointestinal: Soft, no abdominal pain, non-distended, no rebound/guarding. Surgical drain with serosanguinous output (removed). 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 Newly placed drain in RUQ; serous fluid Integumentary: Midline laparotomy healing via secondary intention, no erythema, no drainage, no evidence of fistula  Labs:     Latest Ref Rng & Units 10/15/2022    4:05 AM 10/14/2022     6:19 AM 10/13/2022    6:09 AM  CBC  WBC 4.0 - 10.5 K/uL 15.4  16.1  14.6   Hemoglobin 13.0 - 17.0 g/dL 8.8  8.7  8.4   Hematocrit 39.0 - 52.0 % 28.0  28.1  26.9   Platelets 150 - 400 K/uL 432  455  450       Latest Ref Rng & Units 10/15/2022    4:05 AM 10/14/2022    6:19 AM 10/12/2022    4:28 AM  CMP  Glucose 70 - 99 mg/dL 183  176  155   BUN 8 - 23 mg/dL '19  22  27   '$ Creatinine 0.61 - 1.24 mg/dL 0.92  1.08  1.14   Sodium 135 - 145 mmol/L 136  137  135   Potassium 3.5 - 5.1 mmol/L 4.2  4.2  4.3   Chloride 98 - 111 mmol/L 105  107  106   CO2 22 - 32 mmol/L '24  24  23   '$ Calcium 8.9 - 10.3 mg/dL 8.5  8.4  8.3     Imaging studies:  No new imaging studies    Assessment/Plan:  66 y.o. male doing much better 17 Days Post-Op s/p exploratory laparotomy, abdominal washout, and diverting loop ileostomy for intra-abdominal purulent fluid after initial open right colectomy with open cholecystectomy for right colon cancer and cholelithiasis on 10/19               - Continue soft diet + nutritional supplementation              - Monitor leukocytosis &  Monitor H&H; stable             - Continue IV Abx (Meropenem); also on Micafungin; ID following; stop date 11/13             - Follow up 10/30 Cx; no growth to date             - Monitor abdominal examination - Monitor on-going ileostomy function; imodium              - Midline wound care: Pack daily with saline moistened gauze, cover, secure.Marland KitchenMarland KitchenI will wait to hear about AIR and whether or not he can go with wound vac.              - Removed surgical drain; monitor and record output  - Continue percutaneous drain (10/31) for now; monitor and record output              - Pain control prn; antiemetics prn             - Appreciate cardiology assistance/recommendations   - Oncology on board as well             - Therapies on board; recommending inpatient rehab             - Appreciate medicine assistance as well                - Discharge  Planning; I believe we are at a point to consider discharge to AIR as earlier as tomorrow. Pending acceptance at [facility] (patient preference) vs [facility]  All of the above findings and recommendations were discussed with the patient, and the medical team, and all of patient's questions were answered to his expressed satisfaction.  -- Edison Simon, PA-C [facility] 10/15/2022, 8:44 AM M-F: 7am - 4pm

## 2022-10-15 NOTE — Progress Notes (Signed)
PROGRESS NOTE    Nicolas Zuniga  YIA:[id number] DOB: 11-Sep-1956 DOA: 09/24/2022 PCP: Hortencia Pilar, MD  [id number]  LOS: 21 days   Brief hospital course:   Assessment & Plan: Nicolas Zuniga is a [phone] with a PMH of paroxysmal atrial fibrillation (on flecainide, metoprolol, and Eliquis), HFpEF (EF >55, G1 DD 04/2022), type II diabetes, hypertension, recently diagnosed adenocarcinoma of the colon who presented to [facility]/24/2022 for and elective laparoscopic right colectomy ultimately converted to open right colectomy and cholecystectomy.    Postoperative course was complicated by hypotension and bradycardia the evening of postop day 1, concerning for beta-blocker toxicity for which he was started on glucagon infusion.  Rapid response was called overnight on 10/21 for atrial fibrillation with RVR and questionable VT, was started on amiodarone and diltiazem infusions with some heart rate response.  On 10/23 he underwent exploratory laparotomy, abdominal washout, and diverting loop ileostomy for intra-abdominal purulent fluid seen on CT abdomen pelvis.  Postoperatively, he remained intubated and requiring vasopressor support.  He converted to atrial flutter with variable conduction the evening of 10/23, Extubated 10/24. He has remained in AF with acceptable rate control on amiodarone and metoprolol. Heparin infusion paused the AM of 10/30 for bloody ostomy output. Required reintubation 10/30, fortunately extubated to [place] on 10/31.  Ongoing issues with AF RVR on 11/3, was restarted back on Eliquis.  Pending dispo to rehab versus CIR.    Adenocarcinoma of colon [facility]) large neuroendocrine tumor Right colon cancer status post laparoscopic right colectomy and cholecystectomy on 09/24/2022. Intra-abdominal infection status post exploratory laparotomy with abdominal washout and diverting loop ileostomy on 10/23 10/29 Meropenem started with persistent leukocytosis and Zosyn discontinued. 10/30 interventional  radiology did a CT aspiration of left anterior abdominal fluid collection and placed a drain for the  perihepatic fluid collection. --cont IV meropenem and Micafungin--per ID till 10/19/22 --cont soft diet --cont midline wound care --cont drain management   Septic shock ([place]) Septic shock has resolved.  Off pressors at this point.     Paroxysmal A-fib (HCC) --flecainide d/c'ed --cont amiodarone and metoprolol --cont Eliquis   AKI (acute kidney injury) ([place]) Creatinine peaked at 4.61 on 09/30/2022 improved down to 1.1 --oral hydration   Generalized anxiety disorder --lexapro not on current home med list   Microcytic anemia --anemia workup showed iron def --start IV iron x 3 doses   Weakness Continue working with physical therapy   Acute hypoxic respiratory failure ([place]) Patient required intubation during the hospital course.   Weaned to RA   Hyponatremia, resolved   Type 2 diabetes mellitus, poorly controlled Last hemoglobin A1c 9.8. --cont Levemir 10u daily --ACHS and SSI   Obesity, BMI 34   DVT prophylaxis: [id number] Code Status: Full code  Family Communication: wife updated at bedside today Level of care: Telemetry Medical Dispo:   The patient is from: home Anticipated d/c is to: [facility] CIR Anticipated d/c date is: tomorrow   Subjective and Interval History:  Pt reported doing better.  Tolerating diet.  Had abdominal wound vac changed today.   Objective: Vitals:   10/15/22 0416 10/15/22 0500 10/15/22 0918 10/15/22 1542  BP: 121/64  (!) 147/70 131/68  Pulse: 68  66 71  Resp: 18     Temp: 97.8 F (36.6 C)   98.1 F (36.7 C)  TempSrc: Oral   Oral  SpO2: 97%  100% 98%  Weight:  128 kg    Height:        Intake/Output Summary (Last  24 hours) at 10/15/2022 1731 Last data filed at 10/15/2022 1548 Gross per 24 hour  Intake 250 ml  Output 1542 ml  Net -1292 ml   Filed Weights   10/11/22 0500 10/14/22 0449 10/15/22 0500  Weight: 123.6 kg 120.6 kg 128  kg    Examination:   Constitutional: NAD, AAOx3 HEENT: conjunctivae and lids normal, EOMI CV: No cyanosis.   RESP: normal respiratory effort, on RA GI: wound vac over central abdomen, colostomy bag with brown formed stool Neuro: II - XII grossly intact.   Psych: Normal mood and affect.  Appropriate judgement and reason   Data Reviewed: I have personally reviewed labs and imaging studies  Time spent: 35 minutes  Nicolas Bi, MD [facility] If 7PM-7AM, please contact night-coverage 10/15/2022, 5:31 PM

## 2022-10-15 NOTE — Progress Notes (Addendum)
Inpatient Rehabilitation Admissions Coordinator   I await further progress today with PT, but note that to date he is unlikely to be able to tolerate the intensity required of AIR level rehab.  TOC states surgeon questions tolerance for AIR level rehab also. [facility] unlikely to have bed availability this week.. I spoke with TOC SW to convey my current concerns. Note likely medically ready for discharge tomorrow. I recommend other rehab venues to be pursued.  Danne Baxter, RN, MSN Rehab Admissions Coordinator [phone] 10/15/2022 11:00 AM  Noted [facility] has accepted patient. We will sign off.  Danne Baxter, RN, MSN Rehab Admissions Coordinator [phone] 10/15/2022 4:25 PM

## 2022-10-15 NOTE — Care Management Important Message (Signed)
Important Message  Patient Details  Name: LEAMON PALAU MRN: [id number] Date of Birth: 1956/10/03   Medicare Important Message Given:  Yes     Dannette Barbara 10/15/2022, 1:59 PM

## 2022-10-15 NOTE — TOC Progression Note (Addendum)
Transition of Care [facility]) - Progression Note    Patient Details  Name: TARQUIN WELCHER MRN: [id number] Date of Birth: 1956-02-10  Transition of Care [facility]) CM/SW [place], LCSW Phone Number: 10/15/2022, 10:50 AM  Clinical Narrative:   Per surgery, patient more than likely will require SNF at discharge. Patient unable to tolerate level of intensity for inpatient rehab. CSW spoke to [facility] admissions assistant. They will not have any beds until after Monday. CSW checking other options. Patient will require wound vac at discharge. Per surgery PA, WOC consult placed to obtain wound measurements.  1:00 pm: Met with patient and wife. Provided update. If [facility] does not have a bed by the time he is ready for discharge, [facility] would be next preference. Medically, [facility] admissions coordinator said they can manage him. She is checking the price of his IV abx before giving answer.  3:33 pm: Faxed MAR to [facility] per admissions request. Left message for [facility] admissions coordinator to see if there are any updates on antibiotic cost.  4:03 pm: [facility] can accept patient tomorrow. Left voicemail for EMS supervisor to see if they can transport at 1:00 or after. [facility] admissions coordinator is checking with facility charge nurse about obtaining wound vac.  4:30 pm: [facility] set up for 3:00. Wife is aware and will update patient.  Expected Discharge Plan: [place] Barriers to Discharge: Continued Medical Work up  Expected Discharge Plan and Services Expected Discharge Plan: [place]   Discharge Planning Services: CM Consult Post Acute Care Choice: [place] Living arrangements for the past 2 months: Single Family Home                                       Social Determinants of Health (SDOH) Interventions    Readmission Risk Interventions    09/30/2022    5:08 PM  Readmission Risk Prevention  Plan  Transportation Screening Complete  PCP or Specialist Appt within 3-5 Days Complete  HRI or [place] Complete  Social Work Consult for [place] Planning/Counseling Complete  Palliative Care Screening Not Applicable  Medication Review Press photographer) Complete

## 2022-10-16 LAB — CBC
HCT: 27.5 % — ABNORMAL LOW (ref 39.0–52.0)
Hemoglobin: 8.5 g/dL — ABNORMAL LOW (ref 13.0–17.0)
MCH: 24.1 pg — ABNORMAL LOW (ref 26.0–34.0)
MCHC: 30.9 g/dL (ref 30.0–36.0)
MCV: 78.1 fL — ABNORMAL LOW (ref 80.0–100.0)
Platelets: 431 10*3/uL — ABNORMAL HIGH (ref 150–400)
RBC: 3.52 MIL/uL — ABNORMAL LOW (ref 4.22–5.81)
RDW: 18.3 % — ABNORMAL HIGH (ref 11.5–15.5)
WBC: 13.6 10*3/uL — ABNORMAL HIGH (ref 4.0–10.5)
nRBC: 0 % (ref 0.0–0.2)

## 2022-10-16 LAB — BASIC METABOLIC PANEL
Anion gap: 6 (ref 5–15)
BUN: 18 mg/dL (ref 8–23)
CO2: 25 mmol/L (ref 22–32)
Calcium: 8.5 mg/dL — ABNORMAL LOW (ref 8.9–10.3)
Chloride: 105 mmol/L (ref 98–111)
Creatinine, Ser: 0.91 mg/dL (ref 0.61–1.24)
GFR, Estimated: >60 mL/min (ref >60)
Glucose, Bld: 177 mg/dL — ABNORMAL HIGH (ref 70–99)
Potassium: 4.1 mmol/L (ref 3.5–5.1)
Sodium: 136 mmol/L (ref 135–145)

## 2022-10-16 LAB — PHOSPHORUS: Phosphorus: 3 mg/dL (ref 2.5–4.6)

## 2022-10-16 LAB — GLUCOSE, CAPILLARY
Glucose-Capillary: 173 mg/dL — ABNORMAL HIGH (ref 70–99)
Glucose-Capillary: 232 mg/dL — ABNORMAL HIGH (ref 70–99)
Glucose-Capillary: 161 mg/dL — ABNORMAL HIGH (ref 70–99)
Glucose-Capillary: 143 mg/dL — ABNORMAL HIGH (ref 70–99)

## 2022-10-16 LAB — MAGNESIUM: Magnesium: 1.8 mg/dL (ref 1.7–2.4)

## 2022-10-16 MED ORDER — MEROPENEM IV (FOR PTA / DISCHARGE USE ONLY): 1.0000 g | Freq: Three times a day (TID) | INTRAVENOUS | 0 refills | Status: DC

## 2022-10-16 MED ORDER — AMIODARONE HCL 200 MG PO TABS: 200.0000 mg | ORAL_TABLET | Freq: Every day | ORAL | 0 refills | Status: DC

## 2022-10-16 MED ORDER — TRAMADOL HCL 50 MG PO TABS: 50.0000 mg | ORAL_TABLET | Freq: Four times a day (QID) | ORAL | 0 refills | Status: DC | PRN

## 2022-10-16 MED ORDER — OXYCODONE HCL 5 MG PO TABS: 5.0000 mg | ORAL_TABLET | Freq: Four times a day (QID) | ORAL | 0 refills | Status: DC | PRN

## 2022-10-16 MED ORDER — LOPERAMIDE HCL 2 MG PO CAPS: 4.0000 mg | ORAL_CAPSULE | Freq: Three times a day (TID) | ORAL | 0 refills | Status: DC

## 2022-10-16 MED ORDER — METOPROLOL TARTRATE 50 MG PO TABS: 50.0000 mg | ORAL_TABLET | Freq: Two times a day (BID) | ORAL | 0 refills | Status: DC

## 2022-10-16 MED ORDER — MICAFUNGIN SODIUM 100 MG IV SOLR: 100.0000 mg | Freq: Every day | INTRAVENOUS | 0 refills | Status: DC

## 2022-10-16 NOTE — TOC Progression Note (Signed)
Transition of Care [facility]) - Progression Note    Patient Details  Name: SABAN HEINLEN MRN: [id number] Date of Birth: 1956/03/31  Transition of Care [facility]) CM/SW [place], LCSW Phone Number: 10/16/2022, 4:08 PM  Clinical Narrative:   Discharge cancelled. Admissions coordinator told CSW at 2:50 pm that they do not accept patients past 3:30/4:00. EMS arrived around 4:00. Will contact admissions after 2:00 on Sunday to check Monday availability. Patient and wife are aware. Also discussed [facility]. Patient wants whichever facility has a bed first.  Expected Discharge Plan: [place] Barriers to Discharge: Barriers Resolved  Expected Discharge Plan and Services Expected Discharge Plan: [place]   Discharge Planning Services: CM Consult Post Acute Care Choice: [place] Living arrangements for the past 2 months: Single Family Home Expected Discharge Date: 10/16/22                                     Social Determinants of Health (SDOH) Interventions    Readmission Risk Interventions    09/30/2022    5:08 PM  Readmission Risk Prevention Plan  Transportation Screening Complete  PCP or Specialist Appt within 3-5 Days Complete  HRI or [place] Complete  Social Work Consult for [place] Planning/Counseling Complete  Palliative Care Screening Not Applicable  Medication Review Press photographer) Complete

## 2022-10-16 NOTE — Discharge Instructions (Signed)
In addition to included general post-operative instructions,  Diet: Resume home diet.   Activity: No heavy lifting >20 pounds (children, pets, laundry, garbage) or strenuous activity for 6 weeks from date of last surgery (09/28/2022), but light activity and walking are encouraged. Do not drive or drink alcohol if taking narcotic pain medications or having pain that might distract from driving.  Wound care: Continue wound vac to midline wound on MWF schedule.  Medications: Resume all home medications. For mild to moderate pain: acetaminophen (Tylenol) or ibuprofen/naproxen (if no kidney disease). Combining Tylenol with alcohol can substantially increase your risk of causing liver disease. Narcotic pain medications, if prescribed, can be used for severe pain, though may cause nausea, constipation, and drowsiness. Do not combine Tylenol and Percocet (or similar) within a 6 hour period as Percocet (and similar) contain(s) Tylenol. If you do not need the narcotic pain medication, you do not need to fill the prescription.  Call office [phone]) at any time if any questions, worsening pain, fevers/chills, bleeding, drainage from incision site, or other concerns.

## 2022-10-16 NOTE — Discharge Summary (Signed)
[facility] SURGICAL DISCHARGE SUMMARY   Patient ID: Nicolas Zuniga MRN: [id number] DOB/AGE: May 02, 1956 66 y.o.  Admit date: 09/24/2022 Discharge date: 10/16/2022  Discharge Diagnoses Patient Active Problem List   Diagnosis Date Noted   Intra-abdominal abscess [facility])    Acute hypoxic respiratory failure ([place]) 10/05/2022   Weakness 10/05/2022   Microcytic anemia 10/05/2022   Generalized anxiety disorder 10/04/2022   Diabetic ketoacidosis without coma associated with type 2 diabetes mellitus ([place])    Postoperative intra-abdominal abscess 09/28/2022   Adenocarcinoma of colon (HCC)    Persistent atrial fibrillation (HCC)    Septic shock ([place])    Hyponatremia 09/25/2022   AKI (acute kidney injury) ([place]) 09/25/2022   S/P right colectomy 09/24/2022   S/P partial resection of colon 09/24/2022   Change in bowel habits    Colonic mass    Polyp of transverse colon    Diabetes mellitus type 2, uncomplicated ([place]) 93/71/6967   Kidney stones 04/30/2020   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   Bradycardia 12/30/2018   Paroxysmal A-fib ([place]) 12/14/2018   Non compliance with medical treatment 04/13/2018   History of atrial fibrillation 07/04/2012   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 Attempted Hand assisted Laparoscopic Right colectomy converted to Open Right colectomy with ileocolic anastomosis 2. Open cholecystectomy  09/28/2022 - Dr Dahlia Byes Exploratory laparotomy and abdominal washout; Placement of 19 blake drain; Diverting loop ileostomy   HPI: Nicolas Zuniga is a 66 y.o. 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   Hospital Course: Informed consent was obtained and documented, and patient underwent attempted hand assisted laparoscopic right colectomy converted to open with open cholecystectomy (Dr Dahlia Byes, 09/24/2022). Had initial issues with hypotension and bradycardia on POD1 but this responded to resuscitation. However, he continued to deteriorate over the course of POD2-3 and developed atrial fibrillation with RVR. Medicine was brought on board to assist. Ultimately, he did require takeback on 09/28/2022 for exploration, washout, and diverting loop ileostomy. Intra-operatively there was no evidence of anastomotic leak nor missed bowel injury. All that was encountered was murky fluid in the RUQ. He did require intubation and ICU stay post-operatively. Cardiology also consulted at this time as well. Following his second procedure he did also develop an AKI and nephrology was consulted. He never required CRRT nor HD. Initial NGT was removed on 10/26 after passing clamping trial and diet was started. He did have a rising leukocytosis after second procedure and repeat CT Abdomen/Pelvis was obtained on 10/28 which was concerning for free fluid around the liver and in scattered places throughout his abdomen. Interventional radiology was consulted and he underwent percutaneous drain placement into the RUQ collection on 10/30 and aspiration x2 of other intra-abdominal collections. Cx from this were without growth. Unfortunately, the night of 10/30 he declined from a respiratory perspective and was re-intubated and require vasopressor support. NGT was replaced and enteric feeding was initiate. Fortunately, he was able to be extubated on 10/31. He continued to make slow progress throughout the week. Did require transfusion of 1 unit pRBCs on 11/03. On the evening of 11/03,  he deteriorated again and  progressed back into Atrial fibrillation with RVR. He was transferred again to step-down/ICU. Another CT Abdomen/Pelvis at this time was obtained and was reassuring without intra-abdominal catastrophe. Chromagranin A was sent to rule out carcinoid crisis; oncology following at this time as well. Fortunately, he made marked improvements on 11/04 and continued to make good progress from this point forward. Diet was gradually advanced and he was weaned from enteric feedings. CT Abdomen/Pelvis was repeated on 11/07 which was reassuring. He did have non-specific LUQ fluid but after discussion with IR we did not pursue drainage. His leukocytosis continued to make gradual improvements as well. His surgical drain was removed on 11/09 and wound vac was placed as well. His remaining drain was removed on 11/10. Throughout his admission he was seen by PT/OT who recommended [facility]. He was accepted at [facility].   Infectious Disease Recommendations: Antibiotics: Meropenem, stop date 11/13 Antifungals: Micafungin; stop date 11/13 Okay to remove PICC on completion of these if this is no longer needed  Cardiology Recommendations:  Continue amiodarone 200 mg daily  Do not restart flecainide at discharge Continue metoprolol tartrate to '50mg'$  twice daily  Revisit ablation on an outpatient basis with Dr. Mylinda Latina. Continue Eliquis 5 mg BID  General Surgery Recommendations:  Okay to continue regular diet + nutritional supplementation Continue Imodium 4 mg BID for ileostomy output control; titrate as needed  Continue midline wound vac; MWF schedule  DO NOT RESTART SSRI   Follow Up:  Once discharged from [facility], he should follow up with general surgery in 1-2 weeks. Please call at any time with questions/concerns.    Discharge Condition: Good   Physical Examination on day of Discharge:  Constitutional: Alert, awake, NAD Respiratory: Breathing non-labor, no respiratory distress, on RA Cardiac: regular rate, regular  rhythm  Gastrointestinal: Soft, no abdominal pain, non-distended, no rebound/guarding. 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 Removed last RUQ drain.  Integumentary: Midline laparotomy now with wound vac; good seal.    Allergies as of 10/16/2022   No Known Allergies      Medication List     STOP taking these medications    bisacodyl 5 MG EC tablet Commonly known as: DULCOLAX   flecainide 100 MG tablet Commonly known as: TAMBOCOR   metroNIDAZOLE 500 MG tablet Commonly known as: FLAGYL   neomycin 500 MG tablet Commonly known as: MYCIFRADIN   polyethylene glycol powder 17 GM/SCOOP powder Commonly known as: MiraLax       TAKE these medications    amiodarone 200 MG tablet Commonly known as: PACERONE Take 1 tablet (200 mg total) by mouth daily. Start taking on: October 17, 2022   apixaban 5 MG Tabs tablet Commonly known as: ELIQUIS Take 5 mg by mouth 2 (two) times daily.   CoQ10 100 MG Caps Take 100 mg by mouth daily.   Fish Oil 1000 MG Caps Take 1,000 mg by mouth daily.   glipiZIDE 10 MG 24 hr tablet Commonly known as: GLUCOTROL XL Take 10 mg by mouth 2 (two) times daily.   loperamide 2 MG capsule Commonly known as: IMODIUM Take 2 capsules (4 mg total) by mouth 3 (three) times daily.   meropenem  IVPB Commonly known as: MERREM Inject 1 g into the vein every 8 (eight) hours for 3 days. Indication:  intra-abdominal abscess First Dose: No Last Day of Therapy:  10/19/2022 Labs - Once weekly:  CBC/D and CMP Method of  administration: Mini-Bag Plus / Gravity Method of administration may be changed at the discretion of home infusion pharmacist based upon assessment of the patient and/or caregiver's ability to self-administer the medication ordered.   metFORMIN 500 MG 24 hr tablet Commonly known as: GLUCOPHAGE-XR Take 500 mg by mouth daily with breakfast.   metoprolol tartrate 100 MG tablet Commonly known  as: LOPRESSOR Take 100 mg by mouth 2 (two) times daily. What changed: Another medication with the same name was added. Make sure you understand how and when to take each.   metoprolol tartrate 50 MG tablet Commonly known as: LOPRESSOR Take 1 tablet (50 mg total) by mouth 2 (two) times daily. What changed: You were already taking a medication with the same name, and this prescription was added. Make sure you understand how and when to take each.   micafungin 100 MG Solr injection Commonly known as: MYCAMINE Inject 100 mg into the vein daily for 3 days. Start taking on: October 17, 2022   multivitamin with minerals tablet Take 1 tablet by mouth daily with breakfast.   oxyCODONE 5 MG immediate release tablet Commonly known as: Oxy IR/ROXICODONE Take 1-2 tablets (5-10 mg total) by mouth every 6 (six) hours as needed for severe pain or breakthrough pain.   Ozempic (1 MG/DOSE) 4 MG/3ML Sopn Generic drug: Semaglutide (1 MG/DOSE) Inject 1 mg into the skin every Wednesday. Patient stopped taking these medications   traMADol 50 MG tablet Commonly known as: ULTRAM Take 1 tablet (50 mg total) by mouth every 6 (six) hours as needed for moderate pain or severe pain.   Turmeric 400 MG Caps Take 400 mg by mouth daily.   vitamin B-12 100 MCG tablet Commonly known as: CYANOCOBALAMIN Take 500 mcg by mouth daily.   Vitamin D 50 MCG (2000 UT) Caps Take 2,000 Units by mouth daily.   Vitamin E 400 units Tabs Take 400 Units by mouth daily.               Home Infusion Instuctions  (From admission, onward)           Start     Ordered   10/16/22 0000  Home infusion instructions       Question:  Instructions  Answer:  Flushing of vascular access device: 0.9% NaCl pre/post medication administration and prn patency; Heparin 100 u/ml, 20m for implanted ports and Heparin 10u/ml, 543mfor all other central venous catheters.   10/16/22 0920             Follow-up Information      KoCorey SkainsMD. Go on 11/05/2022.   Specialty: Cardiology Why: Appointment 11/05/22 at 2:00 Contact information: [address]e[facility] CitiesuLake[place] [phone]         PaCaroleen Hamman, MD. Schedule an appointment as soon as possible for a visit on 10/28/2022.   Specialty: General Surgery Why: Follow up in 1-2 weeks after [facility] 10/28/22 at 11:00 Contact information: [address] [phone]               Time spent on discharge management including discussion of hospital course, clinical condition, outpatient instructions, prescriptions, and follow up with the patient and members of the medical team: >30 minutes  -- ZaEdison Simon PA-C [facility]  10/16/2022, 10:35 AM [phone]: 7am - 4pm

## 2022-10-16 NOTE — TOC Transition Note (Addendum)
Transition of Care [facility]) - CM/SW Discharge Note   Patient Details  Name: BOLIVAR KORANDA MRN: [id number] Date of Birth: 1956-10-16  Transition of Care [facility]) CM/SW Contact:  Candie Chroman, LCSW Phone Number: 10/16/2022, 11:06 AM   Clinical Narrative:  Patient has orders to discharge to [place] today (Room [phone]). RN waiting on call back from facility to provide report. EMS transport has been arranged for 1:00. No further concerns. CSW signing off.   Final next level of care: IP Rehab Facility Barriers to Discharge: Barriers Resolved   Patient Goals and CMS Choice Patient states their goals for this hospitalization and ongoing recovery are:: to get better to get stronger CMS Medicare.gov Compare Post Acute Care list provided to:: Patient Choice offered to / list presented to : Patient, Spouse  Discharge Placement              Patient chooses bed at: Other - please specify in the comment section below: [facility]) Patient to be transferred to facility by: EMS Name of family member notified: Kedric Bumgarner Patient and family notified of of transfer: 10/16/22  Discharge Plan and Services   Discharge Planning Services: CM Consult Post Acute Care Choice: [place]                               Social Determinants of Health (SDOH) Interventions     Readmission Risk Interventions    09/30/2022    5:08 PM  Readmission Risk Prevention Plan  Transportation Screening Complete  PCP or Specialist Appt within 3-5 Days Complete  HRI or [place] Complete  Social Work Consult for [place] Planning/Counseling Complete  Palliative Care Screening Not Applicable  Medication Review Press photographer) Complete

## 2022-10-16 NOTE — Progress Notes (Signed)
Due to EMS delayed by more than 3 hrs pt was not able to arrive at [place] facility. Despite my multiple attempts over the phone I could not get EMS company to arrive early nor I could get [facility] physician to accept him after hrs or over the weekend. We will attempt again Monday.

## 2022-10-16 NOTE — TOC Progression Note (Addendum)
Transition of Care [facility]) - Progression Note    Patient Details  Name: Nicolas Zuniga MRN: [id number] Date of Birth: [birth year]  Transition of Care [facility]) CM/SW Contact  Ross Ludwig, [place] Phone Number: 10/16/2022, 5:12 PM  Clinical Narrative:     [facility] TOC supervisor spoke to [place] in admissions at [facility] rehab.  Per [name] patient has to be on site before 4pm, or else they will not accept patient.  Per [name] the assistant medical director said patient can not come any later then 4pm.   This writer spoke to him and explained that EMS was set up for 1pm today, but unfortunately they were backed up and we could not control how long it will take them to get to facility.  Asked if patient would be able to go on the weekend, per [name] he said they will not accept any external transfers over the weekend.  This Probation officer asked if patient can go on Monday, and per [name], only if a bed is available and patient arrives before 4pm.  Weekend [facility] team member will follow up with [facility] admissions on Sunday to see if a bed will be available on Monday.     Expected Discharge Plan: [place] Barriers to Discharge: Barriers Resolved  Expected Discharge Plan and Services Expected Discharge Plan: [place]   Discharge Planning Services: CM Consult Post Acute Care Choice: [place] Living arrangements for the past 2 months: Single Family Home Expected Discharge Date: 10/16/22                                     Social Determinants of Health (SDOH) Interventions    Readmission Risk Interventions    09/30/2022    5:08 PM  Readmission Risk Prevention Plan  Transportation Screening Complete  PCP or Specialist Appt within 3-5 Days Complete  HRI or [place] Complete  Social Work Consult for [place] Planning/Counseling Complete  Palliative Care Screening Not Applicable  Medication Review Press photographer) Complete

## 2022-10-16 NOTE — Progress Notes (Signed)
Patient is alert and oriented x4.Has wound vac but it is uncharted. When leveled, appears to have 50 mls of sanguineous output on wound vac. Denied pain.

## 2022-10-17 LAB — BASIC METABOLIC PANEL
Anion gap: 4 — ABNORMAL LOW (ref 5–15)
BUN: 18 mg/dL (ref 8–23)
CO2: 25 mmol/L (ref 22–32)
Calcium: 8.7 mg/dL — ABNORMAL LOW (ref 8.9–10.3)
Chloride: 108 mmol/L (ref 98–111)
Creatinine, Ser: 1.03 mg/dL (ref 0.61–1.24)
GFR, Estimated: >60 mL/min (ref >60)
Glucose, Bld: 216 mg/dL — ABNORMAL HIGH (ref 70–99)
Potassium: 4.4 mmol/L (ref 3.5–5.1)
Sodium: 137 mmol/L (ref 135–145)

## 2022-10-17 LAB — CBC
HCT: 27.5 % — ABNORMAL LOW (ref 39.0–52.0)
Hemoglobin: 8.5 g/dL — ABNORMAL LOW (ref 13.0–17.0)
MCH: 24.4 pg — ABNORMAL LOW (ref 26.0–34.0)
MCHC: 30.9 g/dL (ref 30.0–36.0)
MCV: 78.8 fL — ABNORMAL LOW (ref 80.0–100.0)
Platelets: 416 10*3/uL — ABNORMAL HIGH (ref 150–400)
RBC: 3.49 MIL/uL — ABNORMAL LOW (ref 4.22–5.81)
RDW: 18.5 % — ABNORMAL HIGH (ref 11.5–15.5)
WBC: 14.6 10*3/uL — ABNORMAL HIGH (ref 4.0–10.5)
nRBC: 0 % (ref 0.0–0.2)

## 2022-10-17 LAB — PHOSPHORUS: Phosphorus: 3 mg/dL (ref 2.5–4.6)

## 2022-10-17 LAB — GLUCOSE, CAPILLARY
Glucose-Capillary: 194 mg/dL — ABNORMAL HIGH (ref 70–99)
Glucose-Capillary: 180 mg/dL — ABNORMAL HIGH (ref 70–99)
Glucose-Capillary: 228 mg/dL — ABNORMAL HIGH (ref 70–99)
Glucose-Capillary: 220 mg/dL — ABNORMAL HIGH (ref 70–99)

## 2022-10-17 LAB — MAGNESIUM: Magnesium: 1.8 mg/dL (ref 1.7–2.4)

## 2022-10-17 NOTE — Progress Notes (Signed)
PT Cancellation Note  Patient Details Name: Nicolas Zuniga MRN: [id number] DOB: [birth year]   Cancelled Treatment:     PT attempt. Pt requested author return at a later time. Will return at 1pm as requested. Did not Dc yesterday due to [facility] unable to accept. Planning to DC to [facility] on Monday.    Willette Pa 10/17/2022, 10:38 AM

## 2022-10-17 NOTE — Progress Notes (Signed)
10/17/2022  Subjective: Patient is 19 Days Post-Op.  No acute events overnight.  Patient had to stay here due to timing issues with transport.  Denies any worsening pain.  Tolerating a soft diet although small amount of p.o. intake.  Vital signs: Temp:  [98 F (36.7 C)-98.3 F (36.8 C)] 98.1 F (36.7 C) (11/11 0759) Pulse Rate:  [65-78] 76 (11/11 0759) Resp:  [16-20] 18 (11/11 0759) BP: (133-137)/(61-71) 137/66 (11/11 0759) SpO2:  [96 %-99 %] 98 % (11/11 0759) Weight:  [121.7 kg] 121.7 kg (11/11 0559)   Intake/Output: 11/10 0701 - 11/11 0700 In: 2791.6 [P.O.:240; IV Piggyback:2551.6] Out: 7482 [Urine:900; Stool:750] Last BM Date : 10/16/22  Physical Exam: Constitutional: No acute distress Abdomen: Soft, nondistended, appropriately sore to palpation.  Midline incision is clean, dry, intact with staples.  Right lower quadrant ostomy with liquid stool, pink and patent.  Labs:  Recent Labs    10/16/22 0615 10/17/22 0630  WBC 13.6* 14.6*  HGB 8.5* 8.5*  HCT 27.5* 27.5*  PLT 431* 416*   Recent Labs    10/16/22 0615 10/17/22 0630  NA 136 137  K 4.1 4.4  CL 105 108  CO2 25 25  GLUCOSE 177* 216*  BUN 18 18  CREATININE 0.91 1.03  CALCIUM 8.5* 8.7*   No results for input(s): "LABPROT", "INR" in the last 72 hours.  Imaging: No results found.  Assessment/Plan: This is a 66 y.o. male s/p open right colectomy with ileocolic anastomosis and cholecystectomy followed by takeback for abdominal washout and a diverting loop ileostomy.  - Currently patient is doing well.  Discussed with him that if his appetite is not great, at least try taking Ensure supplements in order to help with caloric and protein intake. - Continue ambulation as tolerated.  Patient will work with physical therapy today. - Continue antibiotics for now as well. - Anticipate discharging to rehab on Monday.   Melvyn Neth, [facility]

## 2022-10-17 NOTE — Progress Notes (Addendum)
1920 - received report 2000 - obtained vital signs, assessed fingerstick blood glucose 2050 - scheduled medication administration 2212 - watching television, denies needs 2300 - administer scheduled medication, denies needs at this time, refuses refill of water or other beverage 0017 - scheduled medication administration, pt denies needs, declines beverage/ice offer states "I still have enough water here".  0205 - pt resting in bed with eyes closed, respirations even and unlabored, no signs of distress or discomfort noted 0345 - pt requesting "cup of ice" 0540 - VS obtain, blood for lab collected, pt repositioned, ostomy emptied, suction canister emptied

## 2022-10-17 NOTE — Progress Notes (Signed)
Physical Therapy Treatment Patient Details Name: Nicolas Zuniga MRN: [id number] DOB: 05/09/1956 Today's Date: 10/17/2022   History of Present Illness Pt is a 66 y.o. male s/p exploratory laparotomy, abdominal washout, and diverting loop ileostomy for intra-abdominal purulent fluid after initial open right colectomy with open cholecystectomy for right colon cancer and cholelithiasis on 10/19, RUQ drain placed recently then intubated, extubated 10/31. MD assessment includes: Adenocarcinoma of the colon, septic shock, A-fib, AKI, generalized anxiety disorder, anemia, weakness, acute hypoxic respiratory failure, and hyponatremia. Has abdominal wound vac now.    PT Comments    Pt was long sitting in bed upon arriving with supportive family member in room. He agrees to PT session and continued to endorse feeling weak. Author educated pt on expectations of AIR and need for increasing daily activity to promote return in strength. Family member endorses very little intake. Pt required more assistance to exit bed and stand then to ambulate. He tolerated gait to RN station and back to room. Was repositioned in recliner at conclusion of session with call bell in reach. AIR still most appropriate DC disposition to maximize independence while assisting pt to PLOF.    Recommendations for follow up therapy are one component of a multi-disciplinary discharge planning process, led by the attending physician.  Recommendations may be updated based on patient status, additional functional criteria and insurance authorization.  Follow Up Recommendations  Acute inpatient rehab (3hours/day)     Assistance Recommended at Discharge Frequent or constant Supervision/Assistance  Patient can return home with the following A lot of help with bathing/dressing/bathroom;Help with stairs or ramp for entrance;Direct supervision/assist for medications management;Assist for transportation;A lot of help with walking and/or transfers    Equipment Recommendations   (defer to next level of care)       Precautions / Restrictions Precautions Precautions: Fall Restrictions Weight Bearing Restrictions: No Other Position/Activity Restrictions: ostomy, wound vac     Mobility  Bed Mobility Overal bed mobility: Needs Assistance Bed Mobility: Rolling, Supine to Sit Rolling: Min guard Sidelying to sit: Min assist Supine to sit: Min assist  General bed mobility comments: increased time to perform with vcs and step by step cueing    Transfers Overall transfer level: Needs assistance Equipment used: Rolling walker (2 wheels) Transfers: Sit to/from Stand Sit to Stand: Min assist    General transfer comment: pt stood form EOB with min assist of one. Vcs for handplacement and fwd wt shift    Ambulation/Gait Ambulation/Gait assistance: Min guard Gait Distance (Feet): 65 Feet Assistive device: Rolling walker (2 wheels) Gait Pattern/deviations: Step-through pattern, Decreased step length - right, Decreased step length - left Gait velocity: decreased     General Gait Details: pt was able to ambulate to RN station and return. Vcs for posture correction and breathing techniques. Overall tolerated well     Balance Overall balance assessment: Needs assistance Sitting-balance support: Feet supported Sitting balance-Leahy Scale: Good     Standing balance support: Bilateral upper extremity supported, During functional activity, Reliant on assistive device for balance Standing balance-Leahy Scale: Fair Standing balance comment: no lob, very weak       Cognition Arousal/Alertness: Awake/alert Behavior During Therapy: Flat affect Overall Cognitive Status: Within Functional Limits for tasks assessed      General Comments: Pt is A and O but flat. Was able to conversate throughout session appropriately           General Comments General comments (skin integrity, edema, etc.): reviewed importance of increasing daily  activity  and OOB. Recommend pt ambulate several times thorughout the day even without PT      Pertinent Vitals/Pain Pain Assessment Pain Assessment: No/denies pain Pain Score: 0-No pain     PT Goals (current goals can now be found in the care plan section) Acute Rehab PT Goals Patient Stated Goal: To get stronger Progress towards PT goals: Progressing toward goals    Frequency    Min 2X/week      PT Plan Current plan remains appropriate    Co-evaluation     PT goals addressed during session: Mobility/safety with mobility;Balance;Proper use of DME;Strengthening/ROM        AM-PAC PT "6 Clicks" Mobility   Outcome Measure  Help needed turning from your back to your side while in a flat bed without using bedrails?: A Little Help needed moving from lying on your back to sitting on the side of a flat bed without using bedrails?: A Lot Help needed moving to and from a bed to a chair (including a wheelchair)?: A Little Help needed standing up from a chair using your arms (e.g., wheelchair or bedside chair)?: A Little Help needed to walk in hospital room?: A Little Help needed climbing 3-5 steps with a railing? : Total 6 Click Score: 15    End of Session   Activity Tolerance: Patient tolerated treatment well;Patient limited by fatigue Patient left: in chair;with call bell/phone within reach;with chair alarm set;with nursing/sitter in room Nurse Communication: Mobility status PT Visit Diagnosis: Muscle weakness (generalized) (M62.81);Pain;Difficulty in walking, not elsewhere classified (R26.2) Pain - Right/Left: Right     Time: 6384-6659 PT Time Calculation (min) (ACUTE ONLY): 24 min  Charges:  $Gait Training: 8-22 mins $Therapeutic Activity: 8-22 mins                     Julaine Fusi PTA 10/17/22, 1:42 PM

## 2022-10-18 LAB — CBC
HCT: 27.9 % — ABNORMAL LOW (ref 39.0–52.0)
Hemoglobin: 8.6 g/dL — ABNORMAL LOW (ref 13.0–17.0)
MCH: 24.4 pg — ABNORMAL LOW (ref 26.0–34.0)
MCHC: 30.8 g/dL (ref 30.0–36.0)
MCV: 79 fL — ABNORMAL LOW (ref 80.0–100.0)
Platelets: 408 10*3/uL — ABNORMAL HIGH (ref 150–400)
RBC: 3.53 MIL/uL — ABNORMAL LOW (ref 4.22–5.81)
RDW: 18.6 % — ABNORMAL HIGH (ref 11.5–15.5)
WBC: 13.8 10*3/uL — ABNORMAL HIGH (ref 4.0–10.5)
nRBC: 0 % (ref 0.0–0.2)

## 2022-10-18 LAB — BASIC METABOLIC PANEL
Anion gap: 3 — ABNORMAL LOW (ref 5–15)
BUN: 17 mg/dL (ref 8–23)
CO2: 25 mmol/L (ref 22–32)
Calcium: 8.3 mg/dL — ABNORMAL LOW (ref 8.9–10.3)
Chloride: 105 mmol/L (ref 98–111)
Creatinine, Ser: 0.88 mg/dL (ref 0.61–1.24)
GFR, Estimated: >60 mL/min (ref >60)
Glucose, Bld: 173 mg/dL — ABNORMAL HIGH (ref 70–99)
Potassium: 4.3 mmol/L (ref 3.5–5.1)
Sodium: 133 mmol/L — ABNORMAL LOW (ref 135–145)

## 2022-10-18 LAB — MAGNESIUM: Magnesium: 1.7 mg/dL (ref 1.7–2.4)

## 2022-10-18 LAB — GLUCOSE, CAPILLARY
Glucose-Capillary: 160 mg/dL — ABNORMAL HIGH (ref 70–99)
Glucose-Capillary: 229 mg/dL — ABNORMAL HIGH (ref 70–99)
Glucose-Capillary: 216 mg/dL — ABNORMAL HIGH (ref 70–99)
Glucose-Capillary: 178 mg/dL — ABNORMAL HIGH (ref 70–99)

## 2022-10-18 LAB — PHOSPHORUS: Phosphorus: 3.2 mg/dL (ref 2.5–4.6)

## 2022-10-18 NOTE — TOC Progression Note (Addendum)
Transition of Care [facility]) - Progression Note    Patient Details  Name: Nicolas Zuniga MRN: [id number] Date of Birth: [birth year]  Transition of Care [facility]) CM/SW [place], LCSW Phone Number: 10/18/2022, 12:20 PM  Clinical Narrative:  [facility] might have a bed tomorrow. Admissions will not know for sure until after 9:30. They asked to have transport set up for 1:30. Pharmacy will give one of his antibiotics at 11 or 12:00 and it will have to run for an hour. Asked [facility] admissions coordinator to check and see when they might have a bed. Updated patient and wife.    2:18 pm: [facility] to schedule transport for 1:30 tomorrow. Dispatch will confirm with crew chief and call back.  Expected Discharge Plan: [place] Barriers to Discharge: Barriers Resolved  Expected Discharge Plan and Services Expected Discharge Plan: [place]   Discharge Planning Services: CM Consult Post Acute Care Choice: [place] Living arrangements for the past 2 months: Single Family Home Expected Discharge Date: 10/16/22                                     Social Determinants of Health (SDOH) Interventions    Readmission Risk Interventions    09/30/2022    5:08 PM  Readmission Risk Prevention Plan  Transportation Screening Complete  PCP or Specialist Appt within 3-5 Days Complete  HRI or [place] Complete  Social Work Consult for [place] Planning/Counseling Complete  Palliative Care Screening Not Applicable  Medication Review Press photographer) Complete

## 2022-10-18 NOTE — Progress Notes (Signed)
Physical Therapy Treatment Patient Details Name: Nicolas Zuniga MRN: [id number] DOB: 1956-02-16 Today's Date: 10/18/2022   History of Present Illness Pt is a 66 y.o. male s/p exploratory laparotomy, abdominal washout, and diverting loop ileostomy for intra-abdominal purulent fluid after initial open right colectomy with open cholecystectomy for right colon cancer and cholelithiasis on 10/19, RUQ drain placed recently then intubated, extubated 10/31. MD assessment includes: Adenocarcinoma of the colon, septic shock, A-fib, AKI, generalized anxiety disorder, anemia, weakness, acute hypoxic respiratory failure, and hyponatremia. Has abdominal wound vac now.    PT Comments    Premedicated prior to session.  To EOB with min a x 1.  Steady sitting EOB.  Stood with min guard and is able to progress gait slowly 13' with frequent self initiated rest breaks but generally steady.  Visibly fatigued with effort.  He does remain sitting EOB for BUE and LE ex to tolerance.  Returned to supine with min a x 1 for LE assist/comfort.   Recommendations for follow up therapy are one component of a multi-disciplinary discharge planning process, led by the attending physician.  Recommendations may be updated based on patient status, additional functional criteria and insurance authorization.  Follow Up Recommendations  Acute inpatient rehab (3hours/day)     Assistance Recommended at Discharge Frequent or constant Supervision/Assistance  Patient can return home with the following Help with stairs or ramp for entrance;Direct supervision/assist for medications management;Assist for transportation;A little help with walking and/or transfers;A little help with bathing/dressing/bathroom   Equipment Recommendations   (defer to next level of care)    Recommendations for Other Services       Precautions / Restrictions Precautions Precautions: Fall Restrictions Weight Bearing Restrictions: No Other Position/Activity  Restrictions: ostomy, wound vac     Mobility  Bed Mobility Overal bed mobility: Needs Assistance Bed Mobility: Rolling, Supine to Sit, Sit to Supine Rolling: Min guard Sidelying to sit: Min guard Supine to sit: Min assist Sit to supine: Min assist   General bed mobility comments: assist for le's back into bed for comfort    Transfers Overall transfer level: Needs assistance Equipment used: Rolling walker (2 wheels) Transfers: Sit to/from Stand Sit to Stand: Min guard           General transfer comment: pt stood form EOB with min assist of one. Vcs for handplacement and fwd wt shift    Ambulation/Gait Ambulation/Gait assistance: Min guard Gait Distance (Feet): 65 Feet Assistive device: Rolling walker (2 wheels) Gait Pattern/deviations: Step-through pattern, Decreased step length - right, Decreased step length - left Gait velocity: decreased with self initiated rest breaks as needed     General Gait Details: pt was able to ambulate to just past RN station and return. Vcs for posture correction and breathing techniques. Overall tolerated well   Stairs             Wheelchair Mobility    Modified Rankin (Stroke Patients Only)       Balance Overall balance assessment: Needs assistance Sitting-balance support: Feet supported Sitting balance-Leahy Scale: Good     Standing balance support: Bilateral upper extremity supported, During functional activity, Reliant on assistive device for balance Standing balance-Leahy Scale: Fair Standing balance comment: no lob, very weak                            Cognition Arousal/Alertness: Awake/alert Behavior During Therapy: Flat affect, WFL for tasks assessed/performed Overall Cognitive Status: Within Functional Limits for  tasks assessed                                 General Comments: Pt is A and O but flat. Was able to conversate throughout session appropriately        Exercises Other  Exercises Other Exercises: seated shoulder flexion and long arc quads  - self selected unsupproted EOB after gait unil fatigued    General Comments        Pertinent Vitals/Pain Pain Assessment Pain Assessment: Faces Faces Pain Scale: Hurts even more Pain Location: abdomen, right side Pain Descriptors / Indicators: Discomfort, Operative site guarding, Sore Pain Intervention(s): Limited activity within patient's tolerance, Monitored during session, Premedicated before session, Repositioned    Home Living                          Prior Function            PT Goals (current goals can now be found in the care plan section)      Frequency    Min 2X/week      PT Plan Current plan remains appropriate    Co-evaluation              AM-PAC PT "6 Clicks" Mobility   Outcome Measure  Help needed turning from your back to your side while in a flat bed without using bedrails?: A Little Help needed moving from lying on your back to sitting on the side of a flat bed without using bedrails?: A Lot Help needed moving to and from a bed to a chair (including a wheelchair)?: A Little Help needed standing up from a chair using your arms (e.g., wheelchair or bedside chair)?: A Little Help needed to walk in hospital room?: A Little Help needed climbing 3-5 steps with a railing? : Total 6 Click Score: 15    End of Session Equipment Utilized During Treatment: Gait belt Activity Tolerance: Patient tolerated treatment well;Patient limited by fatigue Patient left: with call bell/phone within reach;in bed;with bed alarm set;with family/visitor present Nurse Communication: Mobility status PT Visit Diagnosis: Muscle weakness (generalized) (M62.81);Pain;Difficulty in walking, not elsewhere classified (R26.2) Pain - Right/Left: Right     Time: 0092-3300 PT Time Calculation (min) (ACUTE ONLY): 26 min  Charges:  $Gait Training: 8-22 mins $Therapeutic Exercise: 8-22 mins                     Chesley Noon, PTA 10/18/22, 1:16 PM

## 2022-10-18 NOTE — Progress Notes (Signed)
10/18/2022  Subjective: Patient is 20 Days Post-Op.  No acute events overnight.  Patient had to stay here due to timing issues with transport to rehab.  Denies any worsening pain.  Tolerating a soft diet and reports that he ate more yesterday compared to the previous day.    Vital signs: Temp:  [97.7 F (36.5 C)-98.7 F (37.1 C)] 98.3 F (36.8 C) (11/12 1601) Pulse Rate:  [67-76] 73 (11/12 1601) Resp:  [18-20] 18 (11/12 1601) BP: (119-138)/(54-70) 119/65 (11/12 1601) SpO2:  [96 %-99 %] 98 % (11/12 1601)   Intake/Output: 11/11 0701 - 11/12 0700 In: 450.1 [P.O.:240; I.V.:10; IV Piggyback:200.1] Out: 750 [Urine:500; Stool:250] Last BM Date : 10/18/22  Physical Exam: Constitutional: No acute distress Abdomen: Soft, nondistended, appropriately sore to palpation.  Midline incision is clean, dry, intact with staples.  Right lower quadrant ostomy with liquid stool, pink and patent.  Labs:  Recent Labs    10/17/22 0630 10/18/22 0500  WBC 14.6* 13.8*  HGB 8.5* 8.6*  HCT 27.5* 27.9*  PLT 416* 408*   Recent Labs    10/17/22 0630 10/18/22 0500  NA 137 133*  K 4.4 4.3  CL 108 105  CO2 25 25  GLUCOSE 216* 173*  BUN 18 17  CREATININE 1.03 0.88  CALCIUM 8.7* 8.3*   No results for input(s): "LABPROT", "INR" in the last 72 hours.  Imaging: No results found.  Assessment/Plan: This is a 66 y.o. male s/p open right colectomy with ileocolic anastomosis and cholecystectomy followed by takeback for abdominal washout and a diverting loop ileostomy.  - Continue soft diet as tolerated with supplements like Ensure as tolerated as well. - Continue ambulation as tolerated.  Patient will work with physical therapy today. - Continue antibiotics for now as well.  Discussed with pharmacy to adjust the schedule of the antibiotics for tomorrow so he can leave for rehab and appropriate time without further issues. - Anticipate discharging to rehab on Monday.   Melvyn Neth, [facility]

## 2022-10-19 LAB — CBC
HCT: 27.8 % — ABNORMAL LOW (ref 39.0–52.0)
Hemoglobin: 8.6 g/dL — ABNORMAL LOW (ref 13.0–17.0)
MCH: 24.7 pg — ABNORMAL LOW (ref 26.0–34.0)
MCHC: 30.9 g/dL (ref 30.0–36.0)
MCV: 79.9 fL — ABNORMAL LOW (ref 80.0–100.0)
Platelets: 389 10*3/uL (ref 150–400)
RBC: 3.48 MIL/uL — ABNORMAL LOW (ref 4.22–5.81)
RDW: 18.5 % — ABNORMAL HIGH (ref 11.5–15.5)
WBC: 12.4 10*3/uL — ABNORMAL HIGH (ref 4.0–10.5)
nRBC: 0 % (ref 0.0–0.2)

## 2022-10-19 LAB — BASIC METABOLIC PANEL
Anion gap: 5 (ref 5–15)
BUN: 18 mg/dL (ref 8–23)
CO2: 25 mmol/L (ref 22–32)
Calcium: 8.7 mg/dL — ABNORMAL LOW (ref 8.9–10.3)
Chloride: 102 mmol/L (ref 98–111)
Creatinine, Ser: 0.84 mg/dL (ref 0.61–1.24)
GFR, Estimated: >60 mL/min (ref >60)
Glucose, Bld: 160 mg/dL — ABNORMAL HIGH (ref 70–99)
Potassium: 4.2 mmol/L (ref 3.5–5.1)
Sodium: 132 mmol/L — ABNORMAL LOW (ref 135–145)

## 2022-10-19 LAB — GLUCOSE, CAPILLARY
Glucose-Capillary: 188 mg/dL — ABNORMAL HIGH (ref 70–99)
Glucose-Capillary: 201 mg/dL — ABNORMAL HIGH (ref 70–99)
Glucose-Capillary: 154 mg/dL — ABNORMAL HIGH (ref 70–99)

## 2022-10-19 LAB — PHOSPHORUS: Phosphorus: 3 mg/dL (ref 2.5–4.6)

## 2022-10-19 LAB — MAGNESIUM: Magnesium: 1.8 mg/dL (ref 1.7–2.4)

## 2022-10-19 NOTE — Consult Note (Signed)
WOC reviewed chart, working with [facility] for DC to SNF today.  Will update orders per surgery and team to DC NPWT dressing today and place saline moist gauze dressing, cover with ABD pad, secure with tape.  [facility] will apply new NPWT device tomorrow when they obtain DME.  5 sets of ostomy pouch/skin barrier/barrier rings packed up for patient and given to patient's wife for transfer.  Contact information for [place] nurse provided as well.  Requested ostomy clinic referral   [place], [place], [place]

## 2022-10-19 NOTE — Progress Notes (Signed)
Report given to RN at [facility]

## 2022-10-19 NOTE — TOC Progression Note (Addendum)
Transition of Care [facility]) - Progression Note    Patient Details  Name: Nicolas Zuniga MRN: [id number] Date of Birth: 11/24/1956  Transition of Care [facility]) CM/SW [place], LCSW Phone Number: 10/19/2022, 9:54 AM  Clinical Narrative: Faxed discharge summary to [facility]. Admissions coordinator is still waiting on confirmation that bed is available.   10:44 am: [facility] will not have any beds today but said they definitely will tomorrow. Delayed transport until tomorrow at 1:30. Left message for [facility] admissions coordinator to see when they would have a bed open up.   12:22 pm: [facility] can accept patient today if he comes with a wet-to-dry dressing. Wound vac will be delivered tomorrow. Surgical team, patient, and his wife are agreeable to this plan.   Expected Discharge Plan: [place] Barriers to Discharge: Barriers Resolved  Expected Discharge Plan and Services Expected Discharge Plan: [place]   Discharge Planning Services: CM Consult Post Acute Care Choice: [place] Living arrangements for the past 2 months: Single Family Home Expected Discharge Date: 10/19/22                                     Social Determinants of Health (SDOH) Interventions    Readmission Risk Interventions    09/30/2022    5:08 PM  Readmission Risk Prevention Plan  Transportation Screening Complete  PCP or Specialist Appt within 3-5 Days Complete  HRI or [place] Complete  Social Work Consult for [place] Planning/Counseling Complete  Palliative Care Screening Not Applicable  Medication Review Press photographer) Complete

## 2022-10-19 NOTE — Plan of Care (Signed)
Problem: Education: Goal: Ability to describe self-care measures that may prevent or decrease complications (Diabetes Survival Skills Education) will improve Outcome: Adequate for Discharge   Problem: Coping: Goal: Ability to adjust to condition or change in health will improve Outcome: Adequate for Discharge   Problem: Fluid Volume: Goal: Ability to maintain a balanced intake and output will improve Outcome: Adequate for Discharge   Problem: Health Behavior/Discharge Planning: Goal: Ability to identify and utilize available resources and services will improve Outcome: Adequate for Discharge Goal: Ability to manage health-related needs will improve Outcome: Adequate for Discharge   Problem: Metabolic: Goal: Ability to maintain appropriate glucose levels will improve Outcome: Adequate for Discharge   Problem: Nutritional: Goal: Maintenance of adequate nutrition will improve Outcome: Adequate for Discharge Goal: Progress toward achieving an optimal weight will improve Outcome: Adequate for Discharge   Problem: Skin Integrity: Goal: Risk for impaired skin integrity will decrease Outcome: Adequate for Discharge   Problem: Tissue Perfusion: Goal: Adequacy of tissue perfusion will improve Outcome: Adequate for Discharge   Problem: Education: Goal: Knowledge of General Education information will improve Description: Including pain rating scale, medication(s)/side effects and non-pharmacologic comfort measures Outcome: Adequate for Discharge   Problem: Health Behavior/Discharge Planning: Goal: Ability to manage health-related needs will improve Outcome: Adequate for Discharge   Problem: Clinical Measurements: Goal: Ability to maintain clinical measurements within normal limits will improve Outcome: Adequate for Discharge Goal: Will remain free from infection Outcome: Adequate for Discharge Goal: Diagnostic test results will improve Outcome: Adequate for Discharge Goal:  Respiratory complications will improve Outcome: Adequate for Discharge Goal: Cardiovascular complication will be avoided Outcome: Adequate for Discharge   Problem: Activity: Goal: Risk for activity intolerance will decrease Outcome: Adequate for Discharge   Problem: Nutrition: Goal: Adequate nutrition will be maintained Outcome: Adequate for Discharge   Problem: Coping: Goal: Level of anxiety will decrease Outcome: Adequate for Discharge   Problem: Elimination: Goal: Will not experience complications related to bowel motility Outcome: Adequate for Discharge Goal: Will not experience complications related to urinary retention Outcome: Adequate for Discharge   Problem: Pain Managment: Goal: General experience of comfort will improve Outcome: Adequate for Discharge   Problem: Safety: Goal: Ability to remain free from injury will improve Outcome: Adequate for Discharge   Problem: Skin Integrity: Goal: Risk for impaired skin integrity will decrease Outcome: Adequate for Discharge   Problem: Education: Goal: Knowledge of disease or condition will improve Outcome: Adequate for Discharge Goal: Understanding of medication regimen will improve Outcome: Adequate for Discharge   Problem: Activity: Goal: Ability to tolerate increased activity will improve Outcome: Adequate for Discharge   Problem: Cardiac: Goal: Ability to achieve and maintain adequate cardiopulmonary perfusion will improve Outcome: Adequate for Discharge   Problem: Health Behavior/Discharge Planning: Goal: Ability to safely manage health-related needs after discharge will improve Outcome: Adequate for Discharge   Problem: Education: Goal: Ability to describe self-care measures that may prevent or decrease complications (Diabetes Survival Skills Education) will improve Outcome: Adequate for Discharge   Problem: Cardiac: Goal: Ability to maintain an adequate cardiac output will improve Outcome: Adequate  for Discharge   Problem: Health Behavior/Discharge Planning: Goal: Ability to identify and utilize available resources and services will improve Outcome: Adequate for Discharge Goal: Ability to manage health-related needs will improve Outcome: Adequate for Discharge   Problem: Fluid Volume: Goal: Ability to achieve a balanced intake and output will improve Outcome: Adequate for Discharge   Problem: Metabolic: Goal: Ability to maintain appropriate glucose levels will improve Outcome:  Adequate for Discharge   Problem: Nutritional: Goal: Maintenance of adequate nutrition will improve Outcome: Adequate for Discharge Goal: Maintenance of adequate weight for body size and type will improve Outcome: Adequate for Discharge   Problem: Respiratory: Goal: Will regain and/or maintain adequate ventilation Outcome: Adequate for Discharge   Problem: Urinary Elimination: Goal: Ability to achieve and maintain adequate renal perfusion and functioning will improve Outcome: Adequate for Discharge   Problem: Activity: Goal: Ability to tolerate increased activity will improve Outcome: Adequate for Discharge   Problem: Respiratory: Goal: Ability to maintain a clear airway and adequate ventilation will improve Outcome: Adequate for Discharge   Problem: Role Relationship: Goal: Method of communication will improve Outcome: Adequate for Discharge

## 2022-10-19 NOTE — TOC Transition Note (Signed)
Transition of Care [facility]) - CM/SW Discharge Note   Patient Details  Name: Nicolas Zuniga MRN: [id number] Date of Birth: 1956/09/18  Transition of Care [facility]) CM/SW Contact:  Candie Chroman, LCSW Phone Number: 10/19/2022, 4:10 PM   Clinical Narrative: Patient has orders to discharge to [facility] today. RN has already called report. EMS transport was set up around 2:45 pm and he was 4th on the list. No further concerns. CSW signing off.    Final next level of care: Skilled Nursing Facility Barriers to Discharge: Barriers Resolved   Patient Goals and CMS Choice Patient states their goals for this hospitalization and ongoing recovery are:: to get better to get stronger CMS Medicare.gov Compare Post Acute Care list provided to:: Patient Choice offered to / list presented to : Patient, Spouse  Discharge Placement PASRR number recieved: 10/08/22            Patient chooses bed at: [facility] [place] Patient to be transferred to facility by: EMS Name of family member notified: Blayton Huttner Patient and family notified of of transfer: 10/19/22  Discharge Plan and Services   Discharge Planning Services: CM Consult Post Acute Care Choice: [place]                               Social Determinants of Health (SDOH) Interventions     Readmission Risk Interventions    09/30/2022    5:08 PM  Readmission Risk Prevention Plan  Transportation Screening Complete  PCP or Specialist Appt within 3-5 Days Complete  HRI or [place] Complete  Social Work Consult for [place] Planning/Counseling Complete  Palliative Care Screening Not Applicable  Medication Review Press photographer) Complete

## 2022-10-19 NOTE — Care Management Important Message (Signed)
Important Message  Patient Details  Name: Nicolas Zuniga MRN: [id number] Date of Birth: 07-Mar-1956   Medicare Important Message Given:  Yes     Dannette Barbara 10/19/2022, 12:09 PM

## 2022-10-19 NOTE — Progress Notes (Signed)
Discharge instructions were reviwed with pt and his wife. Right picc line was removed. Wound vac dressing was removed and a new wet to dry dressing placed per MD's orders. Questions were answered and encourage.

## 2022-10-19 NOTE — Discharge Summary (Addendum)
[facility] SURGICAL DISCHARGE SUMMARY   Patient ID: Nicolas Zuniga MRN: [id number] DOB/AGE: 66-10-1956 66 y.o.  Admit date: 09/24/2022 Discharge date: 10/19/2022  Discharge Diagnoses Patient Active Problem List   Diagnosis Date Noted   Intra-abdominal abscess [facility])    Acute hypoxic respiratory failure ([place]) 10/05/2022   Weakness 10/05/2022   Microcytic anemia 10/05/2022   Generalized anxiety disorder 10/04/2022   Diabetic ketoacidosis without coma associated with type 2 diabetes mellitus ([place])    Postoperative intra-abdominal abscess 09/28/2022   Adenocarcinoma of colon (HCC)    Persistent atrial fibrillation (HCC)    Septic shock ([place])    Hyponatremia 09/25/2022   AKI (acute kidney injury) ([place]) 09/25/2022   S/P right colectomy 09/24/2022   S/P partial resection of colon 09/24/2022   Change in bowel habits    Colonic mass    Polyp of transverse colon    Diabetes mellitus type 2, uncomplicated ([place]) 71/05/2693   Kidney stones 04/30/2020   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   Bradycardia 12/30/2018   Paroxysmal A-fib ([place]) 12/14/2018   Non compliance with medical treatment 04/13/2018   History of atrial fibrillation 07/04/2012   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   Diabetes ([place]) 03/08/2007   HYPERLIPIDEMIA 03/08/2007   ANXIETY 03/08/2007   Essential hypertension 03/08/2007   HX, PERSONAL, URINARY CALCULI 03/08/2007    Consultants Medicine Cardiology PCCM Interventional radiology  Oncology  Nephrology  Infectious disease   Procedures 09/24/2022 - Dr Dahlia Byes 1. Attempted Hand assisted Laparoscopic Right colectomy converted to Open Right colectomy with ileocolic anastomosis 2. Open cholecystectomy  09/28/2022 - Dr Dahlia Byes Exploratory laparotomy and abdominal washout; Placement of 19 blake drain; Diverting loop ileostomy   HPI: Nicolas Zuniga is a 66 y.o. 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   Hospital Course: Informed consent was obtained and documented, and patient underwent attempted hand assisted laparoscopic right colectomy converted to open with open cholecystectomy (Dr Dahlia Byes, 09/24/2022). Had initial issues with hypotension and bradycardia on POD1 but this responded to resuscitation. However, he continued to deteriorate over the course of POD2-3 and developed atrial fibrillation with RVR. Medicine was brought on board to assist. Ultimately, he did require takeback on 09/28/2022 for exploration, washout, and diverting loop ileostomy. Intra-operatively there was no evidence of anastomotic leak nor missed bowel injury. All that was encountered was murky fluid in the RUQ. He did require intubation and ICU stay post-operatively. Cardiology also consulted at this time as well. Following his second procedure he did also develop an AKI and nephrology was consulted. He never required CRRT nor HD. Initial NGT was removed on 10/26 after passing clamping trial and diet was started. He did have a rising leukocytosis after second procedure and repeat CT Abdomen/Pelvis was obtained on 10/28 which was concerning for free fluid around the liver and in scattered places throughout his abdomen. Interventional radiology was consulted and he underwent percutaneous drain placement into the RUQ collection on 10/30 and aspiration x2 of other intra-abdominal collections. Cx from this were without growth. Unfortunately, the night of 10/30 he declined from a respiratory perspective and was re-intubated and require vasopressor support. NGT was replaced and enteric feeding was initiate. Fortunately, he was able to be extubated on 10/31. He continued to make slow progress throughout the week. Did require transfusion of 1 unit pRBCs on 11/03. On the evening of 11/03,  he deteriorated again and  progressed back into Atrial fibrillation with RVR. He was transferred again to step-down/ICU. Another CT Abdomen/Pelvis at this time was obtained and was reassuring without intra-abdominal catastrophe. Chromagranin A was sent to rule out carcinoid crisis; oncology following at this time as well. Fortunately, he made marked improvements on 11/04 and continued to make good progress from this point forward. Diet was gradually advanced and he was weaned from enteric feedings. CT Abdomen/Pelvis was repeated on 11/07 which was reassuring. He did have non-specific LUQ fluid but after discussion with IR we did not pursue drainage. His leukocytosis continued to make gradual improvements as well. His surgical drain was removed on 11/09 and wound vac was placed as well. His remaining drain was removed on 11/10. Throughout his admission he was seen by PT/OT who recommended AIR/CIR. He was accepted at [facility].   Unfortunately there was logistically complications and he was not discharged to [place] until 11/13.  Infectious Disease Recommendations: He will have complete all antibiotics and antifungals by discharge  Cardiology Recommendations:  Continue amiodarone 200 mg daily  Do not restart flecainide at discharge Continue metoprolol tartrate to '50mg'$  twice daily  Revisit ablation on an outpatient basis with Dr. Mylinda Latina. Continue Eliquis 5 mg BID  General Surgery Recommendations:  Okay to continue regular diet + nutritional supplementation Continue Imodium 4 mg BID for ileostomy output control; titrate as needed  Continue midline wound vac; MWF schedule  DO NOT RESTART SSRI   Follow Up:  Once discharged from AIR, he should follow up with general surgery in 1-2 weeks. Please call at any time with questions/concerns.    Discharge Condition: Good   Physical Examination on day of Discharge:  Constitutional: Alert, awake, NAD Respiratory: Breathing non-labor, no respiratory distress, on RA Cardiac:  regular rate, regular rhythm  Gastrointestinal: Soft, no abdominal pain, non-distended, no rebound/guarding. 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 Removed last RUQ drain.  Integumentary: Midline laparotomy now with wound vac; good seal.    Allergies as of 10/19/2022   No Known Allergies      Medication List     STOP taking these medications    bisacodyl 5 MG EC tablet Commonly known as: DULCOLAX   flecainide 100 MG tablet Commonly known as: TAMBOCOR   metroNIDAZOLE 500 MG tablet Commonly known as: FLAGYL   neomycin 500 MG tablet Commonly known as: MYCIFRADIN   polyethylene glycol powder 17 GM/SCOOP powder Commonly known as: MiraLax       TAKE these medications    amiodarone 200 MG tablet Commonly known as: PACERONE Take 1 tablet (200 mg total) by mouth daily.   apixaban 5 MG Tabs tablet Commonly known as: ELIQUIS Take 5 mg by mouth 2 (two) times daily.   CoQ10 100 MG Caps Take 100 mg by mouth daily.   Fish Oil 1000 MG Caps Take 1,000 mg by mouth daily.   glipiZIDE 10 MG 24 hr tablet Commonly known as: GLUCOTROL XL Take 10 mg by mouth 2 (two) times daily.   loperamide 2 MG capsule Commonly known as: IMODIUM Take 2 capsules (4 mg total) by mouth 3 (three) times daily.   metFORMIN 500 MG 24 hr tablet Commonly known as: GLUCOPHAGE-XR Take 500 mg by mouth daily with breakfast.   metoprolol tartrate 100 MG tablet Commonly known as: LOPRESSOR Take 100 mg by mouth 2 (two) times daily. What changed: Another medication with the same name was  added. Make sure you understand how and when to take each.   metoprolol tartrate 50 MG tablet Commonly known as: LOPRESSOR Take 1 tablet (50 mg total) by mouth 2 (two) times daily. What changed: You were already taking a medication with the same name, and this prescription was added. Make sure you understand how and when to take each.   multivitamin with minerals  tablet Take 1 tablet by mouth daily with breakfast.   oxyCODONE 5 MG immediate release tablet Commonly known as: Oxy IR/ROXICODONE Take 1-2 tablets (5-10 mg total) by mouth every 6 (six) hours as needed for severe pain or breakthrough pain.   Ozempic (1 MG/DOSE) 4 MG/3ML Sopn Generic drug: Semaglutide (1 MG/DOSE) Inject 1 mg into the skin every Wednesday. Patient stopped taking these medications   traMADol 50 MG tablet Commonly known as: ULTRAM Take 1 tablet (50 mg total) by mouth every 6 (six) hours as needed for moderate pain or severe pain.   Turmeric 400 MG Caps Take 400 mg by mouth daily.   vitamin B-12 100 MCG tablet Commonly known as: CYANOCOBALAMIN Take 500 mcg by mouth daily.   Vitamin D 50 MCG (2000 UT) Caps Take 2,000 Units by mouth daily.   Vitamin E 400 units Tabs Take 400 Units by mouth daily.               Home Infusion Instuctions  (From admission, onward)           Start     Ordered   10/16/22 0000  Home infusion instructions       Question:  Instructions  Answer:  Flushing of vascular access device: 0.9% NaCl pre/post medication administration and prn patency; Heparin 100 u/ml, 81m for implanted ports and Heparin 10u/ml, 575mfor all other central venous catheters.   10/16/22 0920             Follow-up Information     KoCorey SkainsMD. Go on 11/05/2022.   Specialty: Cardiology Why: Appointment 11/05/22 at 2:00 Contact information: [address]eHhc [facility] [phone]         PaCaroleen Hamman, MD. Schedule an appointment as soon as possible for a visit on 10/28/2022.   Specialty: General Surgery Why: Follow up in 1-2 weeks [place] 10/28/22 at 11:00 Contact information: [address]uKrebsC [phone]               Time spent on discharge management including discussion of hospital  course, clinical condition, outpatient instructions, prescriptions, and follow up with the patient and members of the medical team: >30 minutes  -- ZaEdison Simon PA-C [facility]  10/19/2022, 9:19 AM [phone]: 7am - 4pm

## 2022-10-20 ENCOUNTER — Other Ambulatory Visit: Payer: Self-pay

## 2022-10-20 DIAGNOSIS — D509 Iron deficiency anemia, unspecified: Secondary | ICD-10-CM

## 2022-10-26 ENCOUNTER — Telehealth: Payer: Self-pay

## 2022-10-26 NOTE — Telephone Encounter (Signed)
Received call from inpatient rehab facility. Patient will be discharged to home and they need an order for wound vac to be continued while he is at home. Order faxed to [phone] attention Social Work.

## 2022-10-28 ENCOUNTER — Encounter: Payer: Medicare Other | Admitting: Surgery

## 2022-10-28 ENCOUNTER — Other Ambulatory Visit: Payer: Self-pay

## 2022-10-28 MED ORDER — LIDOCAINE 5 % EX PTCH: 1.0000 | MEDICATED_PATCH | Freq: Two times a day (BID) | CUTANEOUS | 1 refills | Status: DC

## 2022-11-02 ENCOUNTER — Ambulatory Visit (HOSPITAL_COMMUNITY): Payer: Medicare Other | Admitting: Nurse Practitioner

## 2022-11-02 ENCOUNTER — Encounter: Payer: Medicare Other | Admitting: Surgery

## 2022-11-03 ENCOUNTER — Ambulatory Visit (HOSPITAL_COMMUNITY)
Admission: RE | Admit: 2022-11-03 | Discharge: 2022-11-03 | Disposition: A | Discharge status: Home / Self Care | Payer: Medicare Other | Source: Ambulatory Visit | Attending: Family Medicine | Admitting: Family Medicine

## 2022-11-03 DIAGNOSIS — L24B3 Irritant contact dermatitis related to fecal or urinary stoma or fistula: Secondary | ICD-10-CM | POA: Insufficient documentation

## 2022-11-03 DIAGNOSIS — Z932 Ileostomy status: Secondary | ICD-10-CM | POA: Diagnosis not present

## 2022-11-03 DIAGNOSIS — K9413 Enterostomy malfunction: Secondary | ICD-10-CM | POA: Diagnosis not present

## 2022-11-03 DIAGNOSIS — L259 Unspecified contact dermatitis, unspecified cause: Secondary | ICD-10-CM | POA: Diagnosis not present

## 2022-11-03 DIAGNOSIS — R198 Other specified symptoms and signs involving the digestive system and abdomen: Secondary | ICD-10-CM | POA: Insufficient documentation

## 2022-11-03 DIAGNOSIS — Z432 Encounter for attention to ileostomy: Secondary | ICD-10-CM | POA: Diagnosis not present

## 2022-11-03 NOTE — Progress Notes (Signed)
[facility]   Reason for visit:  RLQ ileostomy, leaks constantly, per wife Midline abdominal wound with NPWT (MEDELA) in place [facility] assisting in care.  HPI:   Past Medical History:  Diagnosis Date   Adenocarcinoma of colon ([place]) 09/08/2022   Cholelithiasis    Chronic gastritis    Colon polyps    Coronary artery calcification seen on CT scan    Diastolic dysfunction    a.) TTE 04/09/2022: EF >55%, triv TR, G1DD   Diverticulosis    Hypertension    Long term current use of anticoagulant    a.) apixaban   Nephrolithiasis    Paroxysmal A-fib ([place])    a.) CHA2DS2VASc = 3 (age, HTN, T2DM);  b.) s/p DCCV 07/09/2016 (100 J x1), 12/22/2018 (120 J x1), 08/18/2021 (120 J x1), 03/11/2022 (120 J x1), 03/31/2022 (120 J x1); c.) rate/rhythm maintained on oral flecanide + metoprolol succinate; chronically anticoagulated with apixaban   T2DM (type 2 diabetes mellitus) ([place])    Family History  Problem Relation Age of Onset   Varicose Veins Mother    Heart disease Mother    Diabetes Mother    COPD Father    No Known Allergies Current Outpatient Medications  Medication Sig Dispense Refill Last Dose   amiodarone (PACERONE) 200 MG tablet Take 1 tablet (200 mg total) by mouth daily. 30 tablet 0    apixaban (ELIQUIS) 5 MG TABS tablet Take 5 mg by mouth 2 (two) times daily.      Cholecalciferol (VITAMIN D) 50 MCG (2000 UT) CAPS Take 2,000 Units by mouth daily.      Coenzyme Q10 (COQ10) 100 MG CAPS Take 100 mg by mouth daily.      glipiZIDE (GLUCOTROL XL) 10 MG 24 hr tablet Take 10 mg by mouth 2 (two) times daily.       lidocaine (LIDODERM) 5 % Place 1 patch onto the skin every 12 (twelve) hours. Remove & Discard patch within 12 hours or as directed by MD 20 patch 1    loperamide (IMODIUM) 2 MG capsule Take 2 capsules (4 mg total) by mouth 3 (three) times daily. 30 capsule 0    metFORMIN (GLUCOPHAGE-XR) 500 MG 24 hr tablet Take 500 mg by mouth daily with breakfast.      metoprolol  (LOPRESSOR) 100 MG tablet Take 100 mg by mouth 2 (two) times daily.      metoprolol tartrate (LOPRESSOR) 50 MG tablet Take 1 tablet (50 mg total) by mouth 2 (two) times daily. 60 tablet 0    Multiple Vitamins-Minerals (MULTIVITAMIN WITH MINERALS) tablet Take 1 tablet by mouth daily with breakfast.       Omega-3 Fatty Acids (FISH OIL) 1000 MG CAPS Take 1,000 mg by mouth daily.      oxyCODONE (OXY IR/ROXICODONE) 5 MG immediate release tablet Take 1-2 tablets (5-10 mg total) by mouth every 6 (six) hours as needed for severe pain or breakthrough pain. 30 tablet 0    OZEMPIC, 1 MG/DOSE, 4 MG/3ML SOPN Inject 1 mg into the skin every Wednesday. Patient stopped taking these medications      traMADol (ULTRAM) 50 MG tablet Take 1 tablet (50 mg total) by mouth every 6 (six) hours as needed for moderate pain or severe pain. 30 tablet 0    Turmeric 400 MG CAPS Take 400 mg by mouth daily.      vitamin B-12 (CYANOCOBALAMIN) 100 MCG tablet Take 500 mcg by mouth daily.      Vitamin E 400  units TABS Take 400 Units by mouth daily.      No current facility-administered medications for this encounter.   ROS  Review of Systems  Gastrointestinal:        RLQ ileostomy Midline abdominal wound with NPWT (MEDELA ) in place.   Endocrine:       Dry mouth  Skin:  Positive for color change, rash and wound.       FUll thickness tissue loss to peristomal skin  All other systems reviewed and are negative.  Vital signs:  BP (P) 134/79   Pulse (P) 63   Temp (P) 97.7 F (36.5 C) (Oral)   Resp (P) 18   Ht (P) '6\' 2"'$  (1.88 m)   SpO2 (P) 98%   BMI (P) 33.68 kg/m  Exam:  Physical Exam Vitals reviewed.  Abdominal:     Palpations: Abdomen is soft.     Comments: Skin breakdown from leaking ileostomy Midline abdominal wound with NPWT in place   [facility] managing  Skin:    Findings: Erythema, lesion and rash present.     Comments: breakdown  Neurological:     Mental Status: He is alert and oriented to person, place, and  time.  Psychiatric:        Mood and Affect: Mood normal.        Behavior: Behavior normal.     Stoma type/location:  RLQ ileostomy, slightly oval Stomal assessment/size:  1" pink and moist Peristomal assessment:  Full thickness tissue loss to peristomal skin, extends to right flank from leaking and exposure to effluent.  Treatment options for stomal/peristomal skin: Stoma powder and skin prep x 3 layers.  Barrier ring and 1 piece convex pouch with ostomy belt.  Output: High output liquid effluent.  Ostomy pouching: 1pc.convex with barrier ring and peristomal skin crusting  Also applied nasal steroid spray for redness and irritation  Education provided:  see photo Demonstrated pouch change and skin crusting. Secured pouch with barrier strips.  We discussed risk of dehydration and need to sip electrolyte containing fluids such as vitamin water zero and gatorade zero. He reports clear yellow urine and mouth is only slightly dry.     Impression/dx  Contact dermatitis High output ileostomy Discussion  See above Plan  See back in one week.      Visit time: 55 minutes.   Domenic Moras FNP-BC

## 2022-11-03 NOTE — Discharge Instructions (Signed)
Steps for pouch change Remove old pouch and clean with mild soap and water, pat dry.  Spray Nasacort spray onto red skin.  Blot dry if wet.  Apply stoma powder (Item # [id number]) to red skin  Seal with no Sting skin prep Repeat up to 3 times Apply Barrier ring (ITEM # [id number]) Cut pouch using pattern Pouch ITEM # [id number] Add Barrier strips to perimeter to secure Apply belt to secure ITEM # [id number] Empty when 1/3 full Change twice weekly and if leaking

## 2022-11-04 ENCOUNTER — Ambulatory Visit (INDEPENDENT_AMBULATORY_CARE_PROVIDER_SITE_OTHER): Payer: Medicare Other | Admitting: Surgery

## 2022-11-04 ENCOUNTER — Encounter: Payer: Self-pay | Admitting: Surgery

## 2022-11-04 ENCOUNTER — Other Ambulatory Visit: Payer: Self-pay

## 2022-11-04 VITALS — BP 111/74 | HR 66 | Temp 97.7°F | Ht 74.0 in | Wt 270.0 lb

## 2022-11-04 DIAGNOSIS — Z09 Encounter for follow-up examination after completed treatment for conditions other than malignant neoplasm: Secondary | ICD-10-CM

## 2022-11-04 DIAGNOSIS — C189 Malignant neoplasm of colon, unspecified: Secondary | ICD-10-CM

## 2022-11-04 DIAGNOSIS — Z08 Encounter for follow-up examination after completed treatment for malignant neoplasm: Secondary | ICD-10-CM

## 2022-11-04 NOTE — Patient Instructions (Addendum)
Continue with three Imodium daily.  You may need to adjust the dosage of Imodium when you start Chemotherapy. Please see our follow up appointment listed below.

## 2022-11-05 NOTE — Progress Notes (Signed)
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 He did did have a rocky postoperative course due to hypotension as well as rapid ventricular response.  The cause was not clearly elucidated.  Initially I thought that he had some sort of personal crisis.  Patient did have hemodynamic instability during the last operative.  That was not necessarily attributed to a particular surgery complication.  Do think that the bulkiness of the tumor probably the release some substances into the mainstream that caused his hemodynamic fragility . He has recover well considering the magnitude of his postoperative course.  He did have some issues with the ileostomy leaking and went to the ostomy clinic and they have placed a convex appliance as well a ring.  So far discussed holding well.  He also has a wound VAC.  He is tolerating diet.  Fevers no chills he is ambulating  PE NAD Abd: Soft, I removed the wound VAC and the wound measures 20 cm in length by 1.5 cm in width.  There is some fibrinous material at the base.  There is overall good granulation tissue.  Fascia is intact.  Right ileostomy is working without leaks  A/P doing well after major surgery and postoperative rocky course. I do think at this time we can transition to daily wet-to-dry probably next week. I Changed the wound VAC today. He does have f/u w oncology and will require port placement at some point in time

## 2022-11-09 ENCOUNTER — Other Ambulatory Visit: Payer: Medicare Other

## 2022-11-09 ENCOUNTER — Ambulatory Visit (HOSPITAL_COMMUNITY)
Admission: RE | Admit: 2022-11-09 | Discharge: 2022-11-09 | Disposition: A | Discharge status: Home / Self Care | Payer: Medicare Other | Source: Ambulatory Visit | Attending: Nurse Practitioner | Admitting: Nurse Practitioner

## 2022-11-09 ENCOUNTER — Ambulatory Visit (HOSPITAL_COMMUNITY): Payer: Medicare Other | Admitting: Nurse Practitioner

## 2022-11-09 NOTE — Progress Notes (Signed)
[facility]   Reason for visit:  RLQ ileostomy with midline abdominal incision with NPWT (MEDELA).  Will change pouch and wound dressing today.  HPI:   Past Medical History:  Diagnosis Date   Adenocarcinoma of colon ([place]) 09/08/2022   Cholelithiasis    Chronic gastritis    Colon polyps    Coronary artery calcification seen on CT scan    Diastolic dysfunction    a.) TTE 04/09/2022: EF >55%, triv TR, G1DD   Diverticulosis    Hypertension    Long term current use of anticoagulant    a.) apixaban   Nephrolithiasis    Paroxysmal A-fib ([place])    a.) CHA2DS2VASc = 3 (age, HTN, T2DM);  b.) s/p DCCV 07/09/2016 (100 J x1), 12/22/2018 (120 J x1), 08/18/2021 (120 J x1), 03/11/2022 (120 J x1), 03/31/2022 (120 J x1); c.) rate/rhythm maintained on oral flecanide + metoprolol succinate; chronically anticoagulated with apixaban   T2DM (type 2 diabetes mellitus) ([place])    Family History  Problem Relation Age of Onset   Varicose Veins Mother    Heart disease Mother    Diabetes Mother    COPD Father    No Known Allergies Current Outpatient Medications  Medication Sig Dispense Refill Last Dose   amiodarone (PACERONE) 200 MG tablet Take 1 tablet (200 mg total) by mouth daily. 30 tablet 0    apixaban (ELIQUIS) 5 MG TABS tablet Take 5 mg by mouth 2 (two) times daily.      Cholecalciferol (VITAMIN D) 50 MCG (2000 UT) CAPS Take 2,000 Units by mouth daily.      Coenzyme Q10 (COQ10) 100 MG CAPS Take 100 mg by mouth daily.      glipiZIDE (GLUCOTROL XL) 10 MG 24 hr tablet Take 10 mg by mouth 2 (two) times daily.       lidocaine (LIDODERM) 5 % Place 1 patch onto the skin every 12 (twelve) hours. Remove & Discard patch within 12 hours or as directed by MD 20 patch 1    loperamide (IMODIUM) 2 MG capsule Take 2 capsules (4 mg total) by mouth 3 (three) times daily. 30 capsule 0    metoprolol (LOPRESSOR) 100 MG tablet Take 100 mg by mouth 2 (two) times daily.      metoprolol tartrate (LOPRESSOR) 50  MG tablet Take 1 tablet (50 mg total) by mouth 2 (two) times daily. 60 tablet 0    Multiple Vitamins-Minerals (MULTIVITAMIN WITH MINERALS) tablet Take 1 tablet by mouth daily with breakfast.       Omega-3 Fatty Acids (FISH OIL) 1000 MG CAPS Take 1,000 mg by mouth daily.      Turmeric 400 MG CAPS Take 400 mg by mouth daily.      vitamin B-12 (CYANOCOBALAMIN) 100 MCG tablet Take 500 mcg by mouth daily.      Vitamin E 400 units TABS Take 400 Units by mouth daily.      No current facility-administered medications for this encounter.   ROS  Review of Systems  Constitutional:        Gaining strength each day, patient and wife report  Gastrointestinal:        RLQ ileostomy, high output. No signs dehydration, improved hydration since pouch is not leaking    Genitourinary:        Clear yellow urine reported.   All other systems reviewed and are negative.  Vital signs:  BP 134/80   Pulse 64   Temp 97.6 F (36.4 C)  Resp 17   SpO2 98%  Exam:  Physical Exam Vitals reviewed.  Constitutional:      Appearance: Normal appearance.  Abdominal:     Palpations: Abdomen is soft.     Comments: Midline abdominal wound RLQ ileostomy  Skin:    General: Skin is warm and dry.     Findings: Rash present.     Comments: Peristomal breakdown from leaking Extends to right flank, improving with appropriate pouch.   Neurological:     Mental Status: He is alert and oriented to person, place, and time.  Psychiatric:        Mood and Affect: Mood normal.        Behavior: Behavior normal.     Stoma type/location:  RLQ ileostomy Stomal assessment/size:  1 3/8" pink and moist, os points to 9 o'clock and is slightly flush on this side. Peristomal assessment:  skin breakdown much improved.  Pouch stayed in place 6 days!  Contact dermatitis is resolving. Photo in chart Treatment options for stomal/peristomal skin: stoma powder and skin prep, barrier ring and 1 piece convex pouch. Output: liquid tan  stool Ostomy pouching: 1pc. Convex with barrier ring and belt  barrier strips to perimeter for additional support.  Education provided:  We perform pouch change.  Keep belt in place.  Wound care to midline abdomen Wound type:surgical Measurement: 12 cm x 4 cm unable to visualize deepest part of wound due to slough Wound bed: 50% adherent slough Drainage (amount, consistency, odor) minimal serosanguinous  necrotic odor Periwound: breakdown around stoma, improving Dressing procedure/placement/frequency:1 piece black foam to wound bed. Covered with drape and seal immediately achieved.  Tehama changing Mon.Wed.Fri       Impression/dx  Dermatitis peristomal skin, much improved.  Pain is resolved in this area, including right flank Discussion  Continue same pouch Plan  See back in one week.     Visit time: 55 minutes.   Domenic Moras FNP-BC

## 2022-11-10 ENCOUNTER — Encounter: Payer: Self-pay | Admitting: Oncology

## 2022-11-10 ENCOUNTER — Ambulatory Visit (HOSPITAL_COMMUNITY): Payer: Medicare Other | Admitting: Nurse Practitioner

## 2022-11-10 ENCOUNTER — Other Ambulatory Visit: Payer: Medicare Other

## 2022-11-10 ENCOUNTER — Inpatient Hospital Stay: Payer: Medicare Other

## 2022-11-10 ENCOUNTER — Inpatient Hospital Stay: Payer: Medicare Other | Attending: Oncology | Admitting: Oncology

## 2022-11-10 VITALS — BP 102/69 | HR 62 | Resp 18 | Wt 237.7 lb

## 2022-11-10 DIAGNOSIS — R978 Other abnormal tumor markers: Secondary | ICD-10-CM | POA: Diagnosis not present

## 2022-11-10 DIAGNOSIS — E119 Type 2 diabetes mellitus without complications: Secondary | ICD-10-CM | POA: Insufficient documentation

## 2022-11-10 DIAGNOSIS — D509 Iron deficiency anemia, unspecified: Secondary | ICD-10-CM

## 2022-11-10 DIAGNOSIS — Z79899 Other long term (current) drug therapy: Secondary | ICD-10-CM | POA: Insufficient documentation

## 2022-11-10 DIAGNOSIS — I2510 Atherosclerotic heart disease of native coronary artery without angina pectoris: Secondary | ICD-10-CM | POA: Insufficient documentation

## 2022-11-10 DIAGNOSIS — C7A8 Other malignant neuroendocrine tumors: Secondary | ICD-10-CM | POA: Diagnosis not present

## 2022-11-10 DIAGNOSIS — Z7984 Long term (current) use of oral hypoglycemic drugs: Secondary | ICD-10-CM | POA: Diagnosis not present

## 2022-11-10 DIAGNOSIS — Z8719 Personal history of other diseases of the digestive system: Secondary | ICD-10-CM | POA: Diagnosis not present

## 2022-11-10 DIAGNOSIS — I480 Paroxysmal atrial fibrillation: Secondary | ICD-10-CM | POA: Diagnosis not present

## 2022-11-10 DIAGNOSIS — I10 Essential (primary) hypertension: Secondary | ICD-10-CM | POA: Insufficient documentation

## 2022-11-10 DIAGNOSIS — Z7901 Long term (current) use of anticoagulants: Secondary | ICD-10-CM | POA: Diagnosis not present

## 2022-11-10 DIAGNOSIS — C189 Malignant neoplasm of colon, unspecified: Secondary | ICD-10-CM | POA: Diagnosis present

## 2022-11-10 LAB — CBC WITH DIFFERENTIAL/PLATELET
Abs Immature Granulocytes: 0.03 10*3/uL (ref 0.00–0.07)
Basophils Absolute: 0.1 10*3/uL (ref 0.0–0.1)
Basophils Relative: 1 %
Eosinophils Absolute: 0.2 10*3/uL (ref 0.0–0.5)
Eosinophils Relative: 2 %
HCT: 38.1 % — ABNORMAL LOW (ref 39.0–52.0)
Hemoglobin: 12.1 g/dL — ABNORMAL LOW (ref 13.0–17.0)
Immature Granulocytes: 0 %
Lymphocytes Relative: 23 %
Lymphs Abs: 2.9 10*3/uL (ref 0.7–4.0)
MCH: 26.4 pg (ref 26.0–34.0)
MCHC: 31.8 g/dL (ref 30.0–36.0)
MCV: 83 fL (ref 80.0–100.0)
Monocytes Absolute: 1 10*3/uL (ref 0.1–1.0)
Monocytes Relative: 8 %
Neutro Abs: 8.2 10*3/uL — ABNORMAL HIGH (ref 1.7–7.7)
Neutrophils Relative %: 66 %
Platelets: 189 10*3/uL (ref 150–400)
RBC: 4.59 MIL/uL (ref 4.22–5.81)
RDW: 22.9 % — ABNORMAL HIGH (ref 11.5–15.5)
WBC: 12.3 10*3/uL — ABNORMAL HIGH (ref 4.0–10.5)
nRBC: 0 % (ref 0.0–0.2)

## 2022-11-10 LAB — COMPREHENSIVE METABOLIC PANEL
ALT: 31 U/L (ref 0–44)
AST: 42 U/L — ABNORMAL HIGH (ref 15–41)
Albumin: 3 g/dL — ABNORMAL LOW (ref 3.5–5.0)
Alkaline Phosphatase: 110 U/L (ref 38–126)
Anion gap: 10 (ref 5–15)
BUN: 24 mg/dL — ABNORMAL HIGH (ref 8–23)
CO2: 19 mmol/L — ABNORMAL LOW (ref 22–32)
Calcium: 9.4 mg/dL (ref 8.9–10.3)
Chloride: 102 mmol/L (ref 98–111)
Creatinine, Ser: 1.33 mg/dL — ABNORMAL HIGH (ref 0.61–1.24)
GFR, Estimated: 59 mL/min — ABNORMAL LOW (ref >60)
Glucose, Bld: 179 mg/dL — ABNORMAL HIGH (ref 70–99)
Potassium: 4.8 mmol/L (ref 3.5–5.1)
Sodium: 131 mmol/L — ABNORMAL LOW (ref 135–145)
Total Bilirubin: 0.6 mg/dL (ref 0.3–1.2)
Total Protein: 8.3 g/dL — ABNORMAL HIGH (ref 6.5–8.1)

## 2022-11-14 DIAGNOSIS — C7A8 Other malignant neuroendocrine tumors: Secondary | ICD-10-CM | POA: Insufficient documentation

## 2022-11-14 NOTE — Progress Notes (Signed)
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  He underwent a colonoscopy on 09/08/2022 which showed a malignant partially obstructing tumor in the ascending colon along with 2 other subcentimeter polyps.  Biopsy showed single fragment of adenocarcinoma.  He underwent laparoscopic right colectomy and cholecystectomy on 09/24/2022.  Operative findings showed a large right colon mass adherent to the retroperitoneum postoperative course was complicated by hypotension and bradycardia as well as A-fib with RVR.  CT abdomen and pelvis in October 2022 showed small leak but no contrast extravasation.  He was on antibiotics.  There was concern for fluid collection next to the anastomosis and therefore patient was taken back to the OR for exploratory laparotomy which did not show any leak or bowel injury.  However intra-abdominal fluid was purulent with significant inflammatory response.  He underwent placement of JP drain and diverting loop colostomy.  Patient required intubation initially but was successfully extubated on 10/06/2022.  He was again found to have A-fib with RVR hypotension and anemia and transferred back to the ICU.  Presently patient is on IV micafungin for concerns of fungal infection.   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  4 out of 25  lymph nodes were positive for malignancy.  [id number].  Carcinoma positive for synaptophysin and negative for chromogranin.  Morphologic features in conjunction with synaptophysin positivity are consistent with the diagnosis of large cell neuroendocrine carcinoma.  Within the large cell neuroendocrine carcinoma is a minor component of poorly differentiated adenocarcinoma with signet cell morphology.  Tumor consists of less than 30% poorly differentiated adenocarcinoma and therefore being classified as large cell neuroendocrine carcinoma.      Interval history-patient is doing well postoperatively.  However he still has a large midline wound in place requiring a wound VAC.  ECOG PS- 2 Pain scale- 2   Review of systems- Review of Systems  Constitutional:  Positive for malaise/fatigue. Negative for chills, fever and weight loss.  HENT:  Negative for congestion, ear discharge and nosebleeds.   Eyes:  Negative for blurred vision.  Respiratory:  Negative for cough, hemoptysis, sputum production, shortness of breath and wheezing.   Cardiovascular:  Negative for chest pain, palpitations, orthopnea and claudication.  Gastrointestinal:  Negative for abdominal pain, blood in stool, constipation, diarrhea, heartburn, melena, nausea and vomiting.  Genitourinary:  Negative for dysuria, flank pain, frequency, hematuria and urgency.  Musculoskeletal:  Negative for back pain, joint pain and myalgias.  Skin:  Negative for rash.  Neurological:  Negative for dizziness, tingling, focal weakness, seizures, weakness and headaches.  Endo/Heme/Allergies:  Does not bruise/bleed easily.  Psychiatric/Behavioral:  Negative for depression and suicidal ideas. The patient does not have insomnia.       No Known Allergies   Past Medical History:  Diagnosis Date   Adenocarcinoma of colon ([place]) 09/08/2022   Cholelithiasis    Chronic gastritis    Colon polyps    Coronary artery  calcification seen on CT scan     Diastolic dysfunction    a.) TTE 04/09/2022: EF >55%, triv TR, G1DD   Diverticulosis    Hypertension    Long term current use of anticoagulant    a.) apixaban   Nephrolithiasis    Paroxysmal A-fib ([place])    a.) CHA2DS2VASc = 3 (age, HTN, T2DM);  b.) s/p DCCV 07/09/2016 (100 J x1), 12/22/2018 (120 J x1), 08/18/2021 (120 J x1), 03/11/2022 (120 J x1), 03/31/2022 (120 J x1); c.) rate/rhythm maintained on oral flecanide + metoprolol succinate; chronically anticoagulated with apixaban   T2DM (type 2 diabetes mellitus) ([place])      Past Surgical History:  Procedure Laterality Date   CARDIOVERSION N/A 12/22/2018   Procedure: CARDIOVERSION (CATH LAB);  Surgeon: Corey Skains, MD;  Location: [facility] ORS;  Service: Cardiovascular;  Laterality: N/A;   CARDIOVERSION N/A 08/18/2021   Procedure: CARDIOVERSION;  Surgeon: Corey Skains, MD;  Location: [facility] ORS;  Service: Cardiovascular;  Laterality: N/A;   CARDIOVERSION N/A 03/11/2022   Procedure: CARDIOVERSION;  Surgeon: Corey Skains, MD;  Location: [facility] ORS;  Service: Cardiovascular;  Laterality: N/A;   CARDIOVERSION N/A 03/31/2022   Procedure: CARDIOVERSION;  Surgeon: Corey Skains, MD;  Location: [facility] ORS;  Service: Cardiovascular;  Laterality: N/A;   CHOLECYSTECTOMY N/A 09/24/2022   Procedure: LAPAROSCOPIC CHOLECYSTECTOMY;  Surgeon: Jules Husbands, MD;  Location: [facility] ORS;  Service: General;  Laterality: N/A;   COLONOSCOPY WITH PROPOFOL N/A 09/08/2022   Procedure: COLONOSCOPY WITH PROPOFOL;  Surgeon: Lucilla Lame, MD;  Location: [facility] ENDOSCOPY;  Service: Endoscopy;  Laterality: N/A;   ELECTROPHYSIOLOGIC STUDY N/A 07/09/2016   Procedure: CARDIOVERSION;  Surgeon: Dionisio David, MD;  Location: [facility] ORS;  Service: Cardiovascular;  Laterality: N/A;   LAPAROSCOPIC RIGHT COLECTOMY N/A 09/24/2022   Procedure: LAPAROSCOPIC RIGHT COLECTOMY, RNFA to assist;  Surgeon: Jules Husbands, MD;  Location: [facility] ORS;  Service: General;  Laterality: N/A;    LAPAROTOMY N/A 09/28/2022   Procedure: EXPLORATORY LAPAROTOMY WITH OSTOMY CREATION;  Surgeon: Jules Husbands, MD;  Location: [facility] ORS;  Service: General;  Laterality: N/A;   TEE WITHOUT CARDIOVERSION N/A 07/09/2016   Procedure: TRANSESOPHAGEAL ECHOCARDIOGRAM (TEE);  Surgeon: Dionisio David, MD;  Location: [facility] ORS;  Service: Cardiovascular;  Laterality: N/A;   web fingers repaired     as a child    Social History   Socioeconomic History   Marital status: Married    Spouse name: Not on file   Number of children: Not on file   Years of education: Not on file   Highest education level: Not on file  Occupational History   Not on file  Tobacco Use   Smoking status: Never    Passive exposure: Never   Smokeless tobacco: Never  Vaping Use   Vaping Use: Never used  Substance and Sexual Activity   Alcohol use: No   Drug use: No   Sexual activity: Not on file  Other Topics Concern   Not on file  Social History Narrative   Lives with wife, [name], with on pet, cat.   Social Determinants of Health   Financial Resource Strain: Not on file  Food Insecurity: No Food Insecurity (09/24/2022)   Hunger Vital Sign    Worried About Running Out of Food in the Last Year: Never true    Ran Out of Food in the Last Year: Never true  Transportation Needs: No Transportation Needs (09/24/2022)   PRAPARE - Transportation  Lack of Transportation (Medical): No    Lack of Transportation (Non-Medical): No  Physical Activity: Not on file  Stress: Not on file  Social Connections: Not on file  Intimate Partner Violence: Not At Risk (09/24/2022)   Humiliation, Afraid, Rape, and Kick questionnaire    Fear of Current or Ex-Partner: No    Emotionally Abused: No    Physically Abused: No    Sexually Abused: No    Family History  Problem Relation Age of Onset   Varicose Veins Mother    Heart disease Mother    Diabetes Mother    COPD Father      Current Outpatient Medications:    apixaban  (ELIQUIS) 5 MG TABS tablet, Take 5 mg by mouth 2 (two) times daily., Disp: , Rfl:    Cholecalciferol (VITAMIN D) 50 MCG (2000 UT) CAPS, Take 2,000 Units by mouth daily., Disp: , Rfl:    Coenzyme Q10 (COQ10) 100 MG CAPS, Take 100 mg by mouth daily., Disp: , Rfl:    glipiZIDE (GLUCOTROL XL) 10 MG 24 hr tablet, Take 10 mg by mouth 2 (two) times daily. , Disp: , Rfl:    lidocaine (LIDODERM) 5 %, Place 1 patch onto the skin every 12 (twelve) hours. Remove & Discard patch within 12 hours or as directed by MD, Disp: 20 patch, Rfl: 1   loperamide (IMODIUM) 2 MG capsule, Take 2 capsules (4 mg total) by mouth 3 (three) times daily., Disp: 30 capsule, Rfl: 0   metoprolol (LOPRESSOR) 100 MG tablet, Take 100 mg by mouth 2 (two) times daily., Disp: , Rfl:    Multiple Vitamins-Minerals (MULTIVITAMIN WITH MINERALS) tablet, Take 1 tablet by mouth daily with breakfast. , Disp: , Rfl:    Omega-3 Fatty Acids (FISH OIL) 1000 MG CAPS, Take 1,000 mg by mouth daily., Disp: , Rfl:    Turmeric 400 MG CAPS, Take 400 mg by mouth daily., Disp: , Rfl:    vitamin B-12 (CYANOCOBALAMIN) 100 MCG tablet, Take 500 mcg by mouth daily., Disp: , Rfl:    Vitamin E 400 units TABS, Take 400 Units by mouth daily., Disp: , Rfl:    amiodarone (PACERONE) 200 MG tablet, Take 1 tablet (200 mg total) by mouth daily., Disp: 30 tablet, Rfl: 0   metoprolol tartrate (LOPRESSOR) 50 MG tablet, Take 1 tablet (50 mg total) by mouth 2 (two) times daily. (Patient not taking: Reported on 11/10/2022), Disp: 60 tablet, Rfl: 0  Physical exam:  Vitals:   11/10/22 1120  BP: 102/69  Pulse: 62  Resp: 18  SpO2: 100%  Weight: 237 lb 11.2 oz (107.8 kg)   Physical Exam Cardiovascular:     Rate and Rhythm: Normal rate and regular rhythm.     Heart sounds: Normal heart sounds.  Pulmonary:     Effort: Pulmonary effort is normal.     Breath sounds: Normal breath sounds.  Abdominal:     Comments: There is a large midline open wound with foam dressing.  Wound  VAC in place.  Skin:    General: Skin is warm and dry.  Neurological:     Mental Status: He is alert and oriented to person, place, and time.         Latest Ref Rng & Units 11/10/2022   10:07 AM  CMP  Glucose 70 - 99 mg/dL 179   BUN 8 - 23 mg/dL 24   Creatinine 0.61 - 1.24 mg/dL 1.33   Sodium 135 - 145 mmol/L 131   Potassium  3.5 - 5.1 mmol/L 4.8   Chloride 98 - 111 mmol/L 102   CO2 22 - 32 mmol/L 19   Calcium 8.9 - 10.3 mg/dL 9.4   Total Protein 6.5 - 8.1 g/dL 8.3   Total Bilirubin 0.3 - 1.2 mg/dL 0.6   Alkaline Phos 38 - 126 U/L 110   AST 15 - 41 U/L 42   ALT 0 - 44 U/L 31       Latest Ref Rng & Units 11/10/2022   10:07 AM  CBC  WBC 4.0 - 10.5 K/uL 12.3   Hemoglobin 13.0 - 17.0 g/dL 12.1   Hematocrit 39.0 - 52.0 % 38.1   Platelets 150 - 400 K/uL 189      Assessment and plan- Patient is a 66 y.o. 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  What he has is a high-grade poorly differentiated large cell neuroendocrine tumor which is not associated with carcinoid crisis.  These tumors are relatively rare and carry a dismal prognosis.  Ideally for stage IIIc disease patient needs adjuvant chemotherapy with 4 cycles of carbo etoposide.  This treatment is given IV every 3 weeks.  Discussed risks and benefits of chemotherapy including all but not limited to nausea, vomiting, low blood counts, risk of infections and hospitalizations.    There is a significant risk of neutropenia and infections associated with this regimen.  Patient still has a large midline wound in place.  He has recently recovered from a prolonged hospitalization and my concern is that carbidopa site chemotherapy will be very challenging and potentially life-threatening in the presence of an open abdominal wound.  I am  therefore not planning to give him any chemotherapy in the immediate future.  The benefit of chemotherapy lies when it is given within 12 weeks of surgery.  I will await further recommendations from Dr. Dahlia Byes to see when he would be relatively safe to get chemotherapy.  I am planning to see him back in 6 weeks with a PET scan prior to rule out any metastatic disease.  I am not getting a scan at this time as there would be still significant uptake in his abdomen from multiple recent surgeries and inflammation   Visit Diagnosis 1. Adenocarcinoma of colon ([place])   2. Iron deficiency anemia, unspecified iron deficiency anemia type   3. Other abnormal tumor markers      Dr. Randa Evens, MD, MPH [facility] [phone] 11/14/2022 4:16 PM

## 2022-11-18 ENCOUNTER — Encounter: Payer: Self-pay | Admitting: Surgery

## 2022-11-18 ENCOUNTER — Ambulatory Visit (INDEPENDENT_AMBULATORY_CARE_PROVIDER_SITE_OTHER): Payer: Medicare Other | Admitting: Surgery

## 2022-11-18 VITALS — BP 121/75 | HR 65 | Temp 98.0°F | Ht 74.0 in | Wt 239.0 lb

## 2022-11-18 DIAGNOSIS — C189 Malignant neoplasm of colon, unspecified: Secondary | ICD-10-CM

## 2022-11-18 DIAGNOSIS — Z09 Encounter for follow-up examination after completed treatment for conditions other than malignant neoplasm: Secondary | ICD-10-CM

## 2022-11-18 DIAGNOSIS — Z08 Encounter for follow-up examination after completed treatment for malignant neoplasm: Secondary | ICD-10-CM

## 2022-11-18 NOTE — Patient Instructions (Addendum)
Do wet to dry dressings and change daily. Just very lightly dampen the packing gauze and place inside the wound and cover with dry gauze and tape in place.  You may remove all the dressing and shower letting the soapy water run over the area, rinse well, and pat dry and redress the area.    Follow up here after your PET scan.  You may [employer] to obtain your [employer]. They will check your insurance, help you find the best fit among the supplies offered, and help you with delivery. They also have specialized ostomy nurses that may help you with any questions that you have regarding your Ostomy. Their number is: [phone].

## 2022-11-22 ENCOUNTER — Encounter: Payer: Self-pay | Admitting: Surgery

## 2022-11-22 NOTE — Progress Notes (Signed)
Nicolas Zuniga is following for a wound check.  He has doing the wound VAC and has been doing well.  No fevers no chills tolerating p.o.  Ostomy working well.  He has met with oncology about potential adjuvant chemotherapy.  I do think that he needs to recover his strength before embarking into chemotherapy treatments.  PE NAD alert Abd: Soft ileostomy in place widely patent.  Wound VAC removed and there is evidence of erythema and good granulation tissue.  There is fibrinous exudate in the middle of the fascia.  There is also some injury related to a bigger sponge next to the granulation tissue.  Discussed my concerns about continuation of wound VAC therapy and changed the dressing to wet-to-dry.  A/P doing well.  Surgical issues seem to be resolved.  Change wound VAC to wet-to-dry given that the wound continues to improve and I think the VAC sponge she is harming some of the ideations came RTC in a few weeks

## 2022-12-22 ENCOUNTER — Ambulatory Visit
Admission: RE | Admit: 2022-12-22 | Discharge: 2022-12-22 | Disposition: A | Discharge status: Home / Self Care | Payer: Medicare Other | Source: Ambulatory Visit | Attending: Oncology | Admitting: Oncology

## 2022-12-22 DIAGNOSIS — K828 Other specified diseases of gallbladder: Secondary | ICD-10-CM | POA: Insufficient documentation

## 2022-12-22 DIAGNOSIS — C189 Malignant neoplasm of colon, unspecified: Secondary | ICD-10-CM | POA: Diagnosis not present

## 2022-12-22 DIAGNOSIS — C787 Secondary malignant neoplasm of liver and intrahepatic bile duct: Secondary | ICD-10-CM | POA: Diagnosis not present

## 2022-12-22 DIAGNOSIS — J90 Pleural effusion, not elsewhere classified: Secondary | ICD-10-CM | POA: Diagnosis not present

## 2022-12-22 DIAGNOSIS — Z9049 Acquired absence of other specified parts of digestive tract: Secondary | ICD-10-CM | POA: Insufficient documentation

## 2022-12-22 DIAGNOSIS — R911 Solitary pulmonary nodule: Secondary | ICD-10-CM | POA: Diagnosis not present

## 2022-12-22 DIAGNOSIS — I700 Atherosclerosis of aorta: Secondary | ICD-10-CM | POA: Diagnosis not present

## 2022-12-22 DIAGNOSIS — Z932 Ileostomy status: Secondary | ICD-10-CM | POA: Diagnosis not present

## 2022-12-22 DIAGNOSIS — C7A8 Other malignant neuroendocrine tumors: Secondary | ICD-10-CM

## 2022-12-22 LAB — GLUCOSE, CAPILLARY: Glucose-Capillary: 140 mg/dL — ABNORMAL HIGH (ref 70–99)

## 2022-12-22 MED ORDER — FLUDEOXYGLUCOSE F - 18 (FDG) INJECTION: 12.4000 | Freq: Once | INTRAVENOUS | Status: AC

## 2022-12-22 MED ORDER — FLUDEOXYGLUCOSE F - 18 (FDG) INJECTION
12.9500 | Freq: Once | INTRAVENOUS | Status: AC | PRN
  Administered 2022-12-22: 12.95 via INTRAVENOUS

## 2022-12-28 ENCOUNTER — Ambulatory Visit (INDEPENDENT_AMBULATORY_CARE_PROVIDER_SITE_OTHER): Payer: Medicare Other | Admitting: Surgery

## 2022-12-28 ENCOUNTER — Encounter: Payer: Self-pay | Admitting: Surgery

## 2022-12-28 VITALS — BP 116/73 | HR 62 | Temp 98.0°F | Ht 74.0 in | Wt 230.0 lb

## 2022-12-28 DIAGNOSIS — D3A8 Other benign neuroendocrine tumors: Secondary | ICD-10-CM

## 2022-12-28 NOTE — Patient Instructions (Addendum)
Please keep a Telfa gauze on the wound and place a dry dressing over the area.   Begin drinking Boost/ Ensure daily. High protein diet. Implanted Port Insertion  Our surgery scheduler will call you within 24-48 hours to schedule your surgery. Please have the [place] surgery sheet available when speaking with her.     Implanted port insertion is a procedure to put in a port and catheter. The port is a device with an injectable disc that can be accessed by your health care provider. The port is connected to a vein in the chest or neck by a small, thin tube (catheter). There are different types of ports. The implanted port may be used as a long-term IV access for: Medicines, such as chemotherapy. Fluids. Liquid nutrition, such as total parenteral nutrition (TPN). When you have a port, your health care provider can choose to use the port instead of veins in your arms for these procedures. Tell a health care provider about: Any allergies you have. All medicines you are taking, especially blood thinners, as well as any vitamins, herbs, eye drops, creams, over-the-counter medicines, and steroids. Any problems you or family members have had with anesthetic medicines. Any bleeding problems you have. Any surgeries you have had. Any medical conditions you have or have had, including diabetes or kidney problems. Whether you are pregnant or may be pregnant. What are the risks? Generally, this is a safe procedure. However, problems may occur, including: Allergic reactions to medicines or dyes. Damage to other structures or organs. Infection. Damage to the blood vessel, bruising, or bleeding at the puncture site. Blood clot. Breakdown of the skin over the port. A collection of air in the chest that can cause one of the lungs to collapse (pneumothorax). This is rare. What happens before the procedure? When to stop eating and drinking Follow instructions from your health care provider about what you may  eat and drink before your procedure. These may include: 8 hours before your procedure Stop eating most foods. Do not eat meat, fried foods, or fatty foods. Eat only light foods, such as toast or crackers. All liquids are okay except energy drinks and alcohol. 6 hours before your procedure Stop eating. Drink only clear liquids, such as water, clear fruit juice, black coffee, plain tea, and sports drinks. Do not drink energy drinks or alcohol. 2 hours before your procedure Stop drinking all liquids. You may be allowed to take medicines with small sips of water. If you do not follow your health care provider's instructions, your procedure may be delayed or canceled. Medicines Ask your health care provider about: Changing or stopping your regular medicines. This is especially important if you are taking diabetes medicines or blood thinners. Taking medicines such as aspirin and ibuprofen. These medicines can thin your blood. Do not take these medicines unless your health care provider tells you to take them. Taking over-the-counter medicines, vitamins, herbs, and supplements. General instructions If you will be going home right after the procedure, plan to have a responsible adult: Take you home from the hospital or clinic. You will not be allowed to drive. Care for you for the time you are told. You may have blood tests. Do not use any products that contain nicotine or tobacco for at least 4 weeks before the procedure. These products include cigarettes, chewing tobacco, and vaping devices, such as e-cigarettes. If you need help quitting, ask your health care provider. Ask your health care provider what steps will be taken to  help prevent infection. These may include: Removing hair at the surgery site. Washing skin with a germ-killing soap. Taking antibiotic medicine. What happens during the procedure?  An IV will be inserted into one of your veins. You will be given one or more of the  following: A medicine to help you relax (sedative). A medicine to numb the area (local anesthetic). Two small incisions will be made to insert the port. One smaller incision will be made in your neck to get access to the vein where the catheter will lie. The other incision will be made in the upper chest. This is where the port will lie. The procedure may be done using continuous X-ray (fluoroscopy) or other imaging tools for guidance. The port and catheter will be placed. There may be a small, raised area where the port is placed. The port will be flushed with a saline solution, which is made of salt and water, and blood will be drawn to make sure that the port is working correctly. The incisions will be closed. Bandages (dressings) may be placed over the incisions. The procedure may vary among health care providers and hospitals. What happens after the procedure? Your blood pressure, heart rate, breathing rate, and blood oxygen level will be monitored until you leave the hospital or clinic. If you were given a sedative during the procedure, it can affect you for several hours. Do not drive or operate machinery until your health care provider says that it is safe. You will be given a manufacturer's information card for the type of port that you have. Keep this with you. Your port will need to be flushed and checked as told by your health care provider, usually every few weeks. A chest X-ray will be done to: Check the placement of the port. Make sure there is no injury to your lung. Summary Implanted port insertion is a procedure to put in a port and catheter. The implanted port is used as a long-term IV access. The port will need to be flushed and checked as told by your health care provider, usually every few weeks. Keep your manufacturer's information card with you at all times. This information is not intended to replace advice given to you by your health care provider. Make sure you  discuss any questions you have with your health care provider. Document Revised: 05/27/2021 Document Reviewed: 05/27/2021 Elsevier Patient Education  [employer].

## 2022-12-29 ENCOUNTER — Other Ambulatory Visit: Payer: Self-pay | Admitting: *Deleted

## 2022-12-29 ENCOUNTER — Telehealth: Payer: Self-pay | Admitting: Surgery

## 2022-12-29 ENCOUNTER — Inpatient Hospital Stay (HOSPITAL_BASED_OUTPATIENT_CLINIC_OR_DEPARTMENT_OTHER): Payer: Medicare Other | Admitting: Oncology

## 2022-12-29 ENCOUNTER — Telehealth: Payer: Self-pay | Admitting: *Deleted

## 2022-12-29 ENCOUNTER — Inpatient Hospital Stay: Payer: Medicare Other | Attending: Oncology

## 2022-12-29 DIAGNOSIS — Z79899 Other long term (current) drug therapy: Secondary | ICD-10-CM | POA: Diagnosis not present

## 2022-12-29 DIAGNOSIS — R7989 Other specified abnormal findings of blood chemistry: Secondary | ICD-10-CM

## 2022-12-29 DIAGNOSIS — C7A8 Other malignant neuroendocrine tumors: Secondary | ICD-10-CM

## 2022-12-29 DIAGNOSIS — I480 Paroxysmal atrial fibrillation: Secondary | ICD-10-CM | POA: Insufficient documentation

## 2022-12-29 DIAGNOSIS — C787 Secondary malignant neoplasm of liver and intrahepatic bile duct: Secondary | ICD-10-CM

## 2022-12-29 DIAGNOSIS — Z7901 Long term (current) use of anticoagulants: Secondary | ICD-10-CM | POA: Diagnosis not present

## 2022-12-29 DIAGNOSIS — Z7189 Other specified counseling: Secondary | ICD-10-CM

## 2022-12-29 DIAGNOSIS — D509 Iron deficiency anemia, unspecified: Secondary | ICD-10-CM

## 2022-12-29 DIAGNOSIS — E119 Type 2 diabetes mellitus without complications: Secondary | ICD-10-CM | POA: Diagnosis not present

## 2022-12-29 DIAGNOSIS — R978 Other abnormal tumor markers: Secondary | ICD-10-CM

## 2022-12-29 DIAGNOSIS — I10 Essential (primary) hypertension: Secondary | ICD-10-CM | POA: Insufficient documentation

## 2022-12-29 DIAGNOSIS — C7A022 Malignant carcinoid tumor of the ascending colon: Secondary | ICD-10-CM | POA: Diagnosis present

## 2022-12-29 DIAGNOSIS — C7B02 Secondary carcinoid tumors of liver: Secondary | ICD-10-CM | POA: Insufficient documentation

## 2022-12-29 LAB — CBC WITH DIFFERENTIAL/PLATELET
Abs Immature Granulocytes: 0.11 10*3/uL — ABNORMAL HIGH (ref 0.00–0.07)
Basophils Absolute: 0.1 10*3/uL (ref 0.0–0.1)
Basophils Relative: 1 %
Eosinophils Absolute: 0.2 10*3/uL (ref 0.0–0.5)
Eosinophils Relative: 1 %
HCT: 43.2 % (ref 39.0–52.0)
Hemoglobin: 15 g/dL (ref 13.0–17.0)
Immature Granulocytes: 1 %
Lymphocytes Relative: 21 %
Lymphs Abs: 3.1 10*3/uL (ref 0.7–4.0)
MCH: 30.4 pg (ref 26.0–34.0)
MCHC: 34.7 g/dL (ref 30.0–36.0)
MCV: 87.4 fL (ref 80.0–100.0)
Monocytes Absolute: 1.4 10*3/uL — ABNORMAL HIGH (ref 0.1–1.0)
Monocytes Relative: 9 %
Neutro Abs: 10 10*3/uL — ABNORMAL HIGH (ref 1.7–7.7)
Neutrophils Relative %: 67 %
Platelets: 215 10*3/uL (ref 150–400)
RBC: 4.94 MIL/uL (ref 4.22–5.81)
RDW: 15.5 % (ref 11.5–15.5)
WBC: 14.8 10*3/uL — ABNORMAL HIGH (ref 4.0–10.5)
nRBC: 0 % (ref 0.0–0.2)

## 2022-12-29 LAB — COMPREHENSIVE METABOLIC PANEL
ALT: 15 U/L (ref 0–44)
AST: 19 U/L (ref 15–41)
Albumin: 3.9 g/dL (ref 3.5–5.0)
Alkaline Phosphatase: 110 U/L (ref 38–126)
Anion gap: 13 (ref 5–15)
BUN: 39 mg/dL — ABNORMAL HIGH (ref 8–23)
CO2: 16 mmol/L — ABNORMAL LOW (ref 22–32)
Calcium: 10.4 mg/dL — ABNORMAL HIGH (ref 8.9–10.3)
Chloride: 98 mmol/L (ref 98–111)
Creatinine, Ser: 1.76 mg/dL — ABNORMAL HIGH (ref 0.61–1.24)
GFR, Estimated: 42 mL/min — ABNORMAL LOW (ref >60)
Glucose, Bld: 182 mg/dL — ABNORMAL HIGH (ref 70–99)
Potassium: 3.9 mmol/L (ref 3.5–5.1)
Sodium: 127 mmol/L — ABNORMAL LOW (ref 135–145)
Total Bilirubin: 0.2 mg/dL — ABNORMAL LOW (ref 0.3–1.2)
Total Protein: 9.6 g/dL — ABNORMAL HIGH (ref 6.5–8.1)

## 2022-12-29 NOTE — Telephone Encounter (Signed)
Patient has been advised of Pre-Admission date/time, and Surgery date at [facility].  Surgery Date: 01/12/23 Preadmission Testing Date: 01/04/23 (phone 1p-5p)  Patient has been made aware to call [phone], between 1-3:00pm the day before surgery, to find out what time to arrive for surgery.

## 2022-12-29 NOTE — Progress Notes (Signed)
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  He underwent a colonoscopy on 09/08/2022 which showed a malignant partially obstructing tumor in the ascending colon along with 2 other subcentimeter polyps.  Biopsy showed single fragment of adenocarcinoma.  He underwent laparoscopic right colectomy and cholecystectomy on 09/24/2022.  Operative findings showed a large right colon mass adherent to the retroperitoneum postoperative course was complicated by hypotension and bradycardia as well as A-fib with RVR.  CT abdomen and pelvis in October 2022 showed small leak but no contrast extravasation.  He was on antibiotics.  There was concern for fluid collection next to the anastomosis and therefore patient was taken back to the OR for exploratory laparotomy which did not show any leak or bowel injury.  However intra-abdominal fluid was purulent with significant inflammatory response.  He underwent placement of JP drain and diverting loop colostomy.  Patient required intubation initially but was successfully extubated on 10/06/2022.  He was again found to have A-fib with RVR hypotension and anemia and transferred back to the ICU.  Presently patient is on IV micafungin for concerns of fungal infection.   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  4 out of 25  lymph nodes were positive for malignancy.  [id number].  Carcinoma positive for synaptophysin and negative for chromogranin.  Morphologic features in conjunction with synaptophysin positivity are consistent with the diagnosis of large cell neuroendocrine carcinoma.  Within the large cell neuroendocrine carcinoma is a minor component of poorly differentiated adenocarcinoma with signet cell morphology.  Tumor consists of less than 30% poorly differentiated adenocarcinoma and therefore being classified as large cell neuroendocrine carcinoma.    Interval history-abdominal wound is healing slowly and wound VAC is out.  There is a circular gaping wound at the top which is covered by healthy granulation tissue.  He is trying to eat but has lost weight about 9 pounds in the last 1 month.  He reports ongoing fatigue  ECOG PS- 1 Pain scale- 0 Opioid associated constipation- no  Review of systems- Review of Systems  Constitutional:  Positive for malaise/fatigue and weight loss. Negative for chills and fever.  HENT:  Negative for congestion, ear discharge and nosebleeds.   Eyes:  Negative for blurred vision.  Respiratory:  Negative for cough, hemoptysis, sputum production, shortness of breath and wheezing.   Cardiovascular:  Negative for chest pain, palpitations, orthopnea and claudication.  Gastrointestinal:  Negative for abdominal pain, blood in stool, constipation, diarrhea, heartburn, melena, nausea and vomiting.  Genitourinary:  Negative for dysuria, flank pain, frequency, hematuria and urgency.  Musculoskeletal:  Negative for back pain, joint pain and myalgias.  Skin:  Negative for rash.  Neurological:  Negative for dizziness, tingling, focal weakness, seizures, weakness and headaches.  Endo/Heme/Allergies:  Does not bruise/bleed easily.  Psychiatric/Behavioral:  Negative for depression and suicidal ideas. The patient does not have insomnia.       No  Known Allergies   Past Medical History:   Diagnosis Date   Adenocarcinoma of colon ([place]) 09/08/2022   Cholelithiasis    Chronic gastritis    Colon polyps    Coronary artery calcification seen on CT scan    Diastolic dysfunction    a.) TTE 04/09/2022: EF >55%, triv TR, G1DD   Diverticulosis    Hypertension    Long term current use of anticoagulant    a.) apixaban   Nephrolithiasis    Paroxysmal A-fib ([place])    a.) CHA2DS2VASc = 3 (age, HTN, T2DM);  b.) s/p DCCV 07/09/2016 (100 J x1), 12/22/2018 (120 J x1), 08/18/2021 (120 J x1), 03/11/2022 (120 J x1), 03/31/2022 (120 J x1); c.) rate/rhythm maintained on oral flecanide + metoprolol succinate; chronically anticoagulated with apixaban   T2DM (type 2 diabetes mellitus) ([place])      Past Surgical History:  Procedure Laterality Date   CARDIOVERSION N/A 12/22/2018   Procedure: CARDIOVERSION (CATH LAB);  Surgeon: Corey Skains, MD;  Location: [facility] ORS;  Service: Cardiovascular;  Laterality: N/A;   CARDIOVERSION N/A 08/18/2021   Procedure: CARDIOVERSION;  Surgeon: Corey Skains, MD;  Location: [facility] ORS;  Service: Cardiovascular;  Laterality: N/A;   CARDIOVERSION N/A 03/11/2022   Procedure: CARDIOVERSION;  Surgeon: Corey Skains, MD;  Location: [facility] ORS;  Service: Cardiovascular;  Laterality: N/A;   CARDIOVERSION N/A 03/31/2022   Procedure: CARDIOVERSION;  Surgeon: Corey Skains, MD;  Location: [facility] ORS;  Service: Cardiovascular;  Laterality: N/A;   CHOLECYSTECTOMY N/A 09/24/2022   Procedure: LAPAROSCOPIC CHOLECYSTECTOMY;  Surgeon: Jules Husbands, MD;  Location: [facility] ORS;  Service: General;  Laterality: N/A;   COLONOSCOPY WITH PROPOFOL N/A 09/08/2022   Procedure: COLONOSCOPY WITH PROPOFOL;  Surgeon: Lucilla Lame, MD;  Location: [facility] ENDOSCOPY;  Service: Endoscopy;  Laterality: N/A;   ELECTROPHYSIOLOGIC STUDY N/A 07/09/2016   Procedure: CARDIOVERSION;  Surgeon: Dionisio David, MD;  Location: [facility] ORS;  Service: Cardiovascular;  Laterality: N/A;   LAPAROSCOPIC RIGHT  COLECTOMY N/A 09/24/2022   Procedure: LAPAROSCOPIC RIGHT COLECTOMY, RNFA to assist;  Surgeon: Jules Husbands, MD;  Location: [facility] ORS;  Service: General;  Laterality: N/A;   LAPAROTOMY N/A 09/28/2022   Procedure: EXPLORATORY LAPAROTOMY WITH OSTOMY CREATION;  Surgeon: Jules Husbands, MD;  Location: [facility] ORS;  Service: General;  Laterality: N/A;   TEE WITHOUT CARDIOVERSION N/A 07/09/2016   Procedure: TRANSESOPHAGEAL ECHOCARDIOGRAM (TEE);  Surgeon: Dionisio David, MD;  Location: [facility] ORS;  Service: Cardiovascular;  Laterality: N/A;   web fingers repaired     as a child    Social History   Socioeconomic History   Marital status: Married    Spouse name: Not on file   Number of children: Not on file   Years of education: Not on file   Highest education level: Not on file  Occupational History   Not on file  Tobacco Use   Smoking status: Never    Passive exposure: Never   Smokeless tobacco: Never  Vaping Use   Vaping Use: Never used  Substance and Sexual Activity   Alcohol use: No   Drug use: No   Sexual activity: Not on file  Other Topics Concern   Not on file  Social History Narrative   Lives with wife, [name], with on pet, cat.   Social Determinants of Health   Financial Resource Strain: Not on file  Food Insecurity: No Food Insecurity (09/24/2022)   Hunger Vital Sign    Worried About Running Out of  Food in the Last Year: Never true    [place] in the Last Year: Never true  Transportation Needs: No Transportation Needs (09/24/2022)   PRAPARE - Hydrologist (Medical): No    Lack of Transportation (Non-Medical): No  Physical Activity: Not on file  Stress: Not on file  Social Connections: Not on file  Intimate Partner Violence: Not At Risk (09/24/2022)   Humiliation, Afraid, Rape, and Kick questionnaire    Fear of Current or Ex-Partner: No    Emotionally Abused: No    Physically Abused: No    Sexually Abused: No    Family History   Problem Relation Age of Onset   Varicose Veins Mother    Heart disease Mother    Diabetes Mother    COPD Father      Current Outpatient Medications:    apixaban (ELIQUIS) 5 MG TABS tablet, Take 5 mg by mouth 2 (two) times daily., Disp: , Rfl:    Cholecalciferol (VITAMIN D) 50 MCG (2000 UT) CAPS, Take 2,000 Units by mouth daily., Disp: , Rfl:    Coenzyme Q10 (COQ10) 100 MG CAPS, Take 100 mg by mouth daily., Disp: , Rfl:    flecainide (TAMBOCOR) 150 MG tablet, Take 1 tablet by mouth 2 (two) times daily., Disp: , Rfl:    glipiZIDE (GLUCOTROL XL) 10 MG 24 hr tablet, Take 10 mg by mouth 2 (two) times daily. , Disp: , Rfl:    metoprolol (LOPRESSOR) 100 MG tablet, Take 100 mg by mouth 2 (two) times daily., Disp: , Rfl:    Multiple Vitamins-Minerals (MULTIVITAMIN WITH MINERALS) tablet, Take 1 tablet by mouth daily with breakfast. , Disp: , Rfl:    Omega-3 Fatty Acids (FISH OIL) 1000 MG CAPS, Take 1,000 mg by mouth daily., Disp: , Rfl:    Turmeric 400 MG CAPS, Take 400 mg by mouth daily., Disp: , Rfl:    vitamin B-12 (CYANOCOBALAMIN) 100 MCG tablet, Take 500 mcg by mouth daily., Disp: , Rfl:    Vitamin E 400 units TABS, Take 400 Units by mouth daily., Disp: , Rfl:    lidocaine (LIDODERM) 5 %, Place 1 patch onto the skin every 12 (twelve) hours. Remove & Discard patch within 12 hours or as directed by MD (Patient not taking: Reported on 12/29/2022), Disp: 20 patch, Rfl: 1   loperamide (IMODIUM) 2 MG capsule, Take 2 capsules (4 mg total) by mouth 3 (three) times daily. (Patient not taking: Reported on 12/29/2022), Disp: 30 capsule, Rfl: 0  Physical exam: There were no vitals filed for this visit. Physical Exam Cardiovascular:     Rate and Rhythm: Normal rate and regular rhythm.     Heart sounds: Normal heart sounds.  Pulmonary:     Effort: Pulmonary effort is normal.     Breath sounds: Normal breath sounds.  Abdominal:     Comments: Midline surgical wound healing with secondary intention.  Wound  VAC is out there is a gaping area at the top filled with granulation tissue  Skin:    General: Skin is warm and dry.  Neurological:     Mental Status: He is alert and oriented to person, place, and time.         Latest Ref Rng & Units 12/29/2022    9:17 AM  CMP  Glucose 70 - 99 mg/dL 182   BUN 8 - 23 mg/dL 39   Creatinine 0.61 - 1.24 mg/dL 1.76   Sodium 135 - 145  mmol/L 127   Potassium 3.5 - 5.1 mmol/L 3.9   Chloride 98 - 111 mmol/L 98   CO2 22 - 32 mmol/L 16   Calcium 8.9 - 10.3 mg/dL 10.4   Total Protein 6.5 - 8.1 g/dL 9.6   Total Bilirubin 0.3 - 1.2 mg/dL 0.2   Alkaline Phos 38 - 126 U/L 110   AST 15 - 41 U/L 19   ALT 0 - 44 U/L 15       Latest Ref Rng & Units 12/29/2022    9:17 AM  CBC  WBC 4.0 - 10.5 K/uL 14.8   Hemoglobin 13.0 - 17.0 g/dL 15.0   Hematocrit 39.0 - 52.0 % 43.2   Platelets 150 - 400 K/uL 215       NM PET Image Initial (PI) Skull Base To Thigh  Result Date: 12/22/2022 CLINICAL DATA:  Initial treatment strategy for colon cancer. EXAM: NUCLEAR MEDICINE PET SKULL BASE TO THIGH TECHNIQUE: 12.95 mCi F-18 FDG was injected intravenously. Full-ring PET imaging was performed from the skull base to thigh after the radiotracer. CT data was obtained and used for attenuation correction and anatomic localization. Fasting blood glucose: 140 mg/dl COMPARISON:  CT AP 10/14/2022 FINDINGS: Mediastinal blood pool activity: SUV max 2.81 Liver activity: SUV max NA NECK: No hypermetabolic lymph nodes in the neck. Incidental CT findings: None. CHEST: No hypermetabolic mediastinal or hilar nodes. No suspicious pulmonary nodules on the CT scan. Too small to characterize by PET-CT nodule in the inferior right middle lobe measures 3 mm, image 122/2. Incidental CT findings: Moderate right pleural effusion. Aortic atherosclerosis and coronary artery calcifications. ABDOMEN/PELVIS: There are at least 5 foci of abnormal increased radiotracer uptake involving the right lobe of liver  compatible with liver metastases. -Index lesion within segment 7 measures 2.5 by 2.3 cm with SUV max of 14.73, image 130/2. -index lesion within segment 5 measures 2.7 x 2.6 cm with SUV max of 18.03, image 142/2. Soft tissue nodule within the small bowel mesentery measures 0.9 cm with SUV max of 12.17, image 165/2. Irregular, rounded structure within the right lower quadrant of the abdomen has a peripheral area of tracer avid soft tissue nodularity with SUV max of 3.03, image 211/2. This is nonspecific and may represent residual postoperative change. Nodular thickening along the left pericolic gutter adjacent to the descending colon is noted with mild increased uptake. SUV max is equal to 2.2, image 106/2. No abnormal uptake within the pancreas, spleen or adrenal glands. No tracer avid retroperitoneal or pelvic lymph nodes. Incidental CT findings: Status post right hemicolectomy with loop ileostomy Hartmann's pouch is identified which terminates at the level of the proximal transverse colon. Aortic atherosclerosis. Small focus of loculated gas is identified adjacent to the suture chain of the Hartmann's pouch measuring approximately 9 mm, image 151/2. SKELETON: No focal hypermetabolic activity to suggest skeletal metastasis. Incidental CT findings: None. IMPRESSION: 1. Status post right hemicolectomy with loop ileostomy and Hartmann's pouch. There is a small focus of loculated gas adjacent to the suture chain of the Hartmann's pouch which is nonspecific in the postoperative setting. 2. Multifocal FDG avid liver metastases. 3. FDG avid soft tissue nodule within the small bowel mesentery compatible with metastatic adenopathy versus peritoneal disease could. 4. Nodular thickening along the left pericolic gutter adjacent to the descending colon is noted with mild increased radiotracer uptake. This is nonspecific and may reflect postsurgical change versus early peritoneal disease. 5. Moderate right pleural effusion. 6. 3  mm right middle lobe  lung nodule, too small to characterize by PET-CT. 7.  Aortic Atherosclerosis (ICD10-I70.0). Electronically Signed   By: Kerby Moors M.D.   On: 12/22/2022 12:33     Assessment and plan- Patient is a 67 y.o. male with metastatic large cell neuroendocrine tumor of the colon here to discuss further management  When I had seen the patient back in December 2023 patient had a large midline wound requiring wound VAC.  He was not a candidate for adjuvant chemotherapy at that time with his wound complications.  I had explained to him back then as well that with large cell neuroendocrine tumor risk of metastases is high and overall prognosis is poor.  I have reviewed PET CT scan images independently that were done last week.  PET scan unfortunatelyShows evidence of multifocal liver metastases as well as possible peritoneal involvement versus metastatic mesenteric adenopathy.  Discussed that he has stage IV disease which is not curable.  His wound has healed to a good extent and the gaping part of the wound is now covered with healthy granulation tissue.  It would be okay to consider palliative chemotherapy at this time and I will discuss his case with Dr. Dahlia Byes as well.  Patient had predominantly a large cell neuroendocrine carcinoma of the colon and 30% of the tumor tissue was colon adenocarcinoma.I would favor treating him with 4 cycles of carbo etoposide chemotherapy with carbo etoposide given on day 1 and etoposide alone on day 2 and day 3.  I will plan to get scans after 2 cycles and if he has response to treatment I would plan to do 2-4 more cycles.  If patient did not have response to [place] etoposide regimen I would favor switching him to FOLFOX regimen given that he did have a component of poorly differentiated adenocarcinoma as well.  Discussed risks and benefits of chemotherapy including all but not limited to nausea, vomiting, low blood counts, risk of infections and hospitalizations.   I will be sending Ki-67 on the specimen as well. Patient is interested in pursuing chemotherapy and he understands that it would be palliative and not curative.    Patient would like a second opinion at [facility] and I am referring him to Dr. Oralia Rud as well at this time before starting him on any palliative chemotherapy.n a large [employer] retrospective study, cisplatin or carboplatin plus etoposide was prescribed for 252 patients with advanced GEP NEC. The response rate was 31 percent, PFS was four months, and median survival was 11 months. No significant differences in outcomes were observed in patients treated with cisplatin versus carboplatin. A Ki-67 threshold of 55 percent was predictive of response to chemotherapy: tumors with Ki-67 <55 percent were much less responsive to platinum-based chemotherapy (response rate 15 versus 42 percent) but had a significantly longer survival (median overall survival 14 versus 10 months) compared with patients with higher Ki-67 levels   Visit Diagnosis 1. Goals of care, counseling/discussion   2. Neuroendocrine carcinoma of colon ([place])   3. Metastases to the liver [facility])      Dr. Randa Evens, MD, MPH [facility] at [facility] [phone] 12/29/2022 1:13 PM

## 2022-12-29 NOTE — Telephone Encounter (Signed)
Called patient per Dr. Elroy Channel request to review creatine levels and need to increase fluids and add additional lab next week.  Spoke to patient's wife, she states he is asleep.  Gave wife info to increase fluids and return on 01/05/23 @ 1:15 for next lab.  She is agreeable.

## 2022-12-30 ENCOUNTER — Other Ambulatory Visit: Payer: Self-pay

## 2022-12-30 ENCOUNTER — Encounter: Payer: Self-pay | Admitting: Surgery

## 2022-12-30 DIAGNOSIS — C7A8 Other malignant neuroendocrine tumors: Secondary | ICD-10-CM

## 2022-12-30 NOTE — Progress Notes (Signed)
Outpatient Surgical Follow Up  12/30/2022  Nicolas Zuniga is an 67 y.o. male.   Chief Complaint  Patient presents with   Routine Post Op    HPI: Nicolas Zuniga is a 67 year old male well-known to me with history of neuroendocrine colon cancer with metastatic disease to the lymph nodes underwent laparoscopic right colectomy complicated with an abscess requiring diverting loop ileostomy. He did did have a rocky postoperative course due to hypotension as well as rapid ventricular response.  The cause was not clearly elucidated.  Initially I thought that he had some sort of carcinoid crisis.  Patient did have hemodynamic instability during the last operative.  That was not necessarily attributed to a particular surgery complication. I Do think that the bulkiness of the tumor probably dd release some substances into the mainstream that caused his hemodynamic fragility . He has recover well considering the magnitude of his postoperative course.  He did have some issues with the ileostomy leaking and went to the ostomy clinic and they have placed a convex appliance as well a ring.  So far discussed holding well.  He is tolerating diet.  Fevers no chills he is ambulating He has continued to loose weight. His ostomy is working and he does not have a wound vac anymore.  Open wound has improved. Recent PET shows multiple liver mets, I did pers, rev. I also d/w the pt and family about results.    Past Medical History:  Diagnosis Date   Adenocarcinoma of colon ([place]) 09/08/2022   Cholelithiasis    Chronic gastritis    Colon polyps    Coronary artery calcification seen on CT scan    Diastolic dysfunction    a.) TTE 04/09/2022: EF >55%, triv TR, G1DD   Diverticulosis    Hypertension    Long term current use of anticoagulant    a.) apixaban   Nephrolithiasis    Paroxysmal A-fib ([place])    a.) CHA2DS2VASc = 3 (age, HTN, T2DM);  b.) s/p DCCV 07/09/2016 (100 J x1), 12/22/2018 (120 J x1), 08/18/2021 (120 J x1),  03/11/2022 (120 J x1), 03/31/2022 (120 J x1); c.) rate/rhythm maintained on oral flecanide + metoprolol succinate; chronically anticoagulated with apixaban   T2DM (type 2 diabetes mellitus) ([place])     Past Surgical History:  Procedure Laterality Date   CARDIOVERSION N/A 12/22/2018   Procedure: CARDIOVERSION (CATH LAB);  Surgeon: Corey Skains, MD;  Location: [facility] ORS;  Service: Cardiovascular;  Laterality: N/A;   CARDIOVERSION N/A 08/18/2021   Procedure: CARDIOVERSION;  Surgeon: Corey Skains, MD;  Location: [facility];  Service: Cardiovascular;  Laterality: N/A;   CARDIOVERSION N/A 03/11/2022   Procedure: CARDIOVERSION;  Surgeon: Corey Skains, MD;  Location: [facility];  Service: Cardiovascular;  Laterality: N/A;   CARDIOVERSION N/A 03/31/2022   Procedure: CARDIOVERSION;  Surgeon: Corey Skains, MD;  Location: [facility];  Service: Cardiovascular;  Laterality: N/A;   CHOLECYSTECTOMY N/A 09/24/2022   Procedure: LAPAROSCOPIC CHOLECYSTECTOMY;  Surgeon: Jules Husbands, MD;  Location: [facility];  Service: General;  Laterality: N/A;   COLONOSCOPY WITH PROPOFOL N/A 09/08/2022   Procedure: COLONOSCOPY WITH PROPOFOL;  Surgeon: Lucilla Lame, MD;  Location: [facility] ENDOSCOPY;  Service: Endoscopy;  Laterality: N/A;   ELECTROPHYSIOLOGIC STUDY N/A 07/09/2016   Procedure: CARDIOVERSION;  Surgeon: Dionisio David, MD;  Location: [facility] OR[facility];  Service: Cardiovascular;  Laterality: N/A;   LAPAROSCOPIC RIGHT COLECTOMY N/A 09/24/2022   Procedure: LAPAROSCOPIC RIGHT COLECTOMY, RNFA to assist;  Surgeon: Jules Husbands, MD;  Location: [facility];  Service: General;  Laterality: N/A;   LAPAROTOMY N/A 09/28/2022   Procedure: EXPLORATORY LAPAROTOMY WITH OSTOMY CREATION;  Surgeon: Jules Husbands, MD;  Location: [facility] OR[facility];  Service: General;  Laterality: N/A;   TEE WITHOUT CARDIOVERSION N/A 07/09/2016   Procedure: TRANSESOPHAGEAL ECHOCARDIOGRAM (TEE);  Surgeon: Dionisio David, MD;  Location: [facility] ORS;  Service:  Cardiovascular;  Laterality: N/A;   web fingers repaired     as a child    Family History  Problem Relation Age of Onset   Varicose Veins Mother    Heart disease Mother    Diabetes Mother    COPD Father     Social History:  reports that he has never smoked. He has never been exposed to tobacco smoke. He has never used smokeless tobacco. He reports that he does not drink alcohol and does not use drugs.  Allergies: No Known Allergies  Medications reviewed.    ROS Full ROS performed and is otherwise negative other than what is stated in HPI   BP 116/73   Pulse 62   Temp 98 F (36.7 C)   Ht '6\' 2"'$  (1.88 m)   Wt 230 lb (104.3 kg)   SpO2 100%   BMI 29.53 kg/m   Physical Exam Vitals reviewed.  Constitutional:      General: He is not in acute distress.    Comments: Chronically ill and malnourished  Cardiovascular:     Rate and Rhythm: Normal rate and regular rhythm.  Pulmonary:     Effort: Pulmonary effort is normal. No respiratory distress.     Breath sounds: Normal breath sounds. No stridor. No wheezing or rhonchi.  Abdominal:     General: Abdomen is flat. There is no distension.     Palpations: Abdomen is soft. There is no mass.     Tenderness: There is no guarding or rebound.     Comments: Ostomy working. There is a midline open wound measures 4 x 3 cm, good granulation tissue noit infected. Changed to only Telfa.   Musculoskeletal:        General: Normal range of motion.     Cervical back: Normal range of motion and neck supple. No rigidity or tenderness.  Skin:    General: Skin is warm and dry.     Capillary Refill: Capillary refill takes less than 2 seconds.  Neurological:     General: No focal deficit present.     Mental Status: He is oriented to person, place, and time.  Psychiatric:        Mood and Affect: Mood normal.        Behavior: Behavior normal.        Thought Content: Thought content normal.        Judgment: Judgment normal.     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  More recently underwent PET/CT consistent with metastatic disease within the liver.  I do think that he has make some progress regarding his wound healing however he is still debilitated due to his metastatic disease and his aggressive cancer.  Unfortunately there is no good solutions.  Neuroendocrine tumor of the large bowel has poor prognosis.  I do think that they wish to pursue chemotherapy and with that in mind we will be happy to place port for him.  They also are open to go to [facility] for second opinion regarding oncology.  Procedure discussed with the patient  detail.  Risk, benefits and possible conditions including but not limited to: Bleeding, infection, pneumothorax, catheter malfunction.  They understand and wish to proceed.  Please note that I spent greater than 40 minutes in this encounter including personally reviewing imaging studies, coordinating his care, placing orders and performing appropriate documentation  Caroleen Hamman, MD Interior Surgeon

## 2022-12-30 NOTE — Progress Notes (Signed)
Urgent referral placed to Dr. Oralia Rud at [facility]

## 2022-12-30 NOTE — H&P (View-Only) (Signed)
Outpatient Surgical Follow Up  12/30/2022  Nicolas Zuniga is an 67 y.o. male.   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 He did did have a rocky postoperative course due to hypotension as well as rapid ventricular response.  The cause was not clearly elucidated.  Initially I thought that he had some sort of carcinoid crisis.  Patient did have hemodynamic instability during the last operative.  That was not necessarily attributed to a particular surgery complication. I Do think that the bulkiness of the tumor probably dd release some substances into the mainstream that caused his hemodynamic fragility . He has recover well considering the magnitude of his postoperative course.  He did have some issues with the ileostomy leaking and went to the ostomy clinic and they have placed a convex appliance as well a ring.  So far discussed holding well.  He is tolerating diet.  Fevers no chills he is ambulating He has continued to loose weight. His ostomy is working and he does not have a wound vac anymore.  Open wound has improved. Recent PET shows multiple liver mets, I did pers, rev. I also d/w the pt and family about results.    Past Medical History:  Diagnosis Date   Adenocarcinoma of colon ([place]) 09/08/2022   Cholelithiasis    Chronic gastritis    Colon polyps    Coronary artery calcification seen on CT scan    Diastolic dysfunction    a.) TTE 04/09/2022: EF >55%, triv TR, G1DD   Diverticulosis    Hypertension    Long term current use of anticoagulant    a.) apixaban   Nephrolithiasis    Paroxysmal A-fib ([place])    a.) CHA2DS2VASc = 3 (age, HTN, T2DM);  b.) s/p DCCV 07/09/2016 (100 J x1), 12/22/2018 (120 J x1), 08/18/2021 (120 J x1),  03/11/2022 (120 J x1), 03/31/2022 (120 J x1); c.) rate/rhythm maintained on oral flecanide + metoprolol succinate; chronically anticoagulated with apixaban   T2DM (type 2 diabetes mellitus) ([place])     Past Surgical History:  Procedure Laterality Date   CARDIOVERSION N/A 12/22/2018   Procedure: CARDIOVERSION (CATH LAB);  Surgeon: Corey Skains, MD;  Location: [facility] OR[facility];  Service: Cardiovascular;  Laterality: N/A;   CARDIOVERSION N/A 08/18/2021   Procedure: CARDIOVERSION;  Surgeon: Corey Skains, MD;  Location: [facility];  Service: Cardiovascular;  Laterality: N/A;   CARDIOVERSION N/A 03/11/2022   Procedure: CARDIOVERSION;  Surgeon: Corey Skains, MD;  Location: [facility];  Service: Cardiovascular;  Laterality: N/A;   CARDIOVERSION N/A 03/31/2022   Procedure: CARDIOVERSION;  Surgeon: Corey Skains, MD;  Location: [facility] ORS;  Service: Cardiovascular;  Laterality: N/A;   CHOLECYSTECTOMY N/A 09/24/2022   Procedure: LAPAROSCOPIC CHOLECYSTECTOMY;  Surgeon: Jules Husbands, MD;  Location: [facility] ORS;  Service: General;  Laterality: N/A;   COLONOSCOPY WITH PROPOFOL N/A 09/08/2022   Procedure: COLONOSCOPY WITH PROPOFOL;  Surgeon: Lucilla Lame, MD;  Location: [facility] ENDOSCOPY;  Service: Endoscopy;  Laterality: N/A;   ELECTROPHYSIOLOGIC STUDY N/A 07/09/2016   Procedure: CARDIOVERSION;  Surgeon: Dionisio David, MD;  Location: [facility] ORS;  Service: Cardiovascular;  Laterality: N/A;   LAPAROSCOPIC RIGHT COLECTOMY N/A 09/24/2022   Procedure: LAPAROSCOPIC RIGHT COLECTOMY, RNFA to assist;  Surgeon: Jules Husbands, MD;  Location: [facility] ORS;  Service: General;  Laterality: N/A;   LAPAROTOMY N/A 09/28/2022   Procedure: EXPLORATORY LAPAROTOMY WITH OSTOMY CREATION;  Surgeon: Jules Husbands, MD;  Location: [facility] OR[facility];  Service: General;  Laterality: N/A;   TEE WITHOUT CARDIOVERSION N/A 07/09/2016   Procedure: TRANSESOPHAGEAL ECHOCARDIOGRAM (TEE);  Surgeon: Dionisio David, MD;  Location: [facility] ORS;  Service:  Cardiovascular;  Laterality: N/A;   web fingers repaired     as a child    Family History  Problem Relation Age of Onset   Varicose Veins Mother    Heart disease Mother    Diabetes Mother    COPD Father     Social History:  reports that he has never smoked. He has never been exposed to tobacco smoke. He has never used smokeless tobacco. He reports that he does not drink alcohol and does not use drugs.  Allergies: No Known Allergies  Medications reviewed.    ROS Full ROS performed and is otherwise negative other than what is stated in HPI   BP 116/73   Pulse 62   Temp 98 F (36.7 C)   Ht '6\' 2"'$  (1.88 m)   Wt 230 lb (104.3 kg)   SpO2 100%   BMI 29.53 kg/m   Physical Exam Vitals reviewed.  Constitutional:      General: He is not in acute distress.    Comments: Chronically ill and malnourished  Cardiovascular:     Rate and Rhythm: Normal rate and regular rhythm.  Pulmonary:     Effort: Pulmonary effort is normal. No respiratory distress.     Breath sounds: Normal breath sounds. No stridor. No wheezing or rhonchi.  Abdominal:     General: Abdomen is flat. There is no distension.     Palpations: Abdomen is soft. There is no mass.     Tenderness: There is no guarding or rebound.     Comments: Ostomy working. There is a midline open wound measures 4 x 3 cm, good granulation tissue noit infected. Changed to only Telfa.   Musculoskeletal:        General: Normal range of motion.     Cervical back: Normal range of motion and neck supple. No rigidity or tenderness.  Skin:    General: Skin is warm and dry.     Capillary Refill: Capillary refill takes less than 2 seconds.  Neurological:     General: No focal deficit present.     Mental Status: He is oriented to person, place, and time.  Psychiatric:        Mood and Affect: Mood normal.        Behavior: Behavior normal.        Thought Content: Thought content normal.        Judgment: Judgment normal.     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  More recently underwent PET/CT consistent with metastatic disease within the liver.  I do think that he has make some progress regarding his wound healing however he is still debilitated due to his metastatic disease and his aggressive cancer.  Unfortunately there is no good solutions.  Neuroendocrine tumor of the large bowel has poor prognosis.  I do think that they wish to pursue chemotherapy and with that in mind we will be happy to place port for him.  They also are open to go to [facility] for second opinion regarding oncology.  Procedure discussed with the patient  detail.  Risk, benefits and possible conditions including but not limited to: Bleeding, infection, pneumothorax, catheter malfunction.  They understand and wish to proceed.  Please note that I spent greater than 40 minutes in this encounter including personally reviewing imaging studies, coordinating his care, placing orders and performing appropriate documentation  Caroleen Hamman, MD [place] Surgeon

## 2022-12-31 LAB — SURGICAL PATHOLOGY

## 2023-01-04 ENCOUNTER — Other Ambulatory Visit: Payer: Self-pay

## 2023-01-04 ENCOUNTER — Inpatient Hospital Stay: Payer: Medicare Other

## 2023-01-04 ENCOUNTER — Inpatient Hospital Stay
Admission: EM | Admit: 2023-01-04 | Discharge: 2023-01-06 | DRG: 637 | Disposition: A | Discharge status: Home / Self Care | Payer: Medicare Other | Attending: Obstetrics and Gynecology | Admitting: Obstetrics and Gynecology

## 2023-01-04 ENCOUNTER — Inpatient Hospital Stay
Admission: RE | Admit: 2023-01-04 | Discharge: 2023-01-04 | Disposition: A | Discharge status: Home / Self Care | Payer: BLUE CROSS/BLUE SHIELD | Source: Ambulatory Visit

## 2023-01-04 DIAGNOSIS — E860 Dehydration: Secondary | ICD-10-CM | POA: Diagnosis present

## 2023-01-04 DIAGNOSIS — Z9049 Acquired absence of other specified parts of digestive tract: Secondary | ICD-10-CM | POA: Diagnosis not present

## 2023-01-04 DIAGNOSIS — Z87442 Personal history of urinary calculi: Secondary | ICD-10-CM

## 2023-01-04 DIAGNOSIS — I2510 Atherosclerotic heart disease of native coronary artery without angina pectoris: Secondary | ICD-10-CM | POA: Diagnosis present

## 2023-01-04 DIAGNOSIS — Z9181 History of falling: Secondary | ICD-10-CM | POA: Diagnosis not present

## 2023-01-04 DIAGNOSIS — E43 Unspecified severe protein-calorie malnutrition: Secondary | ICD-10-CM | POA: Diagnosis present

## 2023-01-04 DIAGNOSIS — C189 Malignant neoplasm of colon, unspecified: Secondary | ICD-10-CM | POA: Diagnosis present

## 2023-01-04 DIAGNOSIS — E1110 Type 2 diabetes mellitus with ketoacidosis without coma: Principal | ICD-10-CM | POA: Diagnosis present

## 2023-01-04 DIAGNOSIS — Z1152 Encounter for screening for COVID-19: Secondary | ICD-10-CM | POA: Diagnosis not present

## 2023-01-04 DIAGNOSIS — D72829 Elevated white blood cell count, unspecified: Secondary | ICD-10-CM | POA: Diagnosis present

## 2023-01-04 DIAGNOSIS — Z7901 Long term (current) use of anticoagulants: Secondary | ICD-10-CM | POA: Diagnosis not present

## 2023-01-04 DIAGNOSIS — C7A8 Other malignant neuroendocrine tumors: Secondary | ICD-10-CM | POA: Diagnosis present

## 2023-01-04 DIAGNOSIS — Z86010 Personal history of colonic polyps: Secondary | ICD-10-CM

## 2023-01-04 DIAGNOSIS — Z7984 Long term (current) use of oral hypoglycemic drugs: Secondary | ICD-10-CM | POA: Diagnosis not present

## 2023-01-04 DIAGNOSIS — R748 Abnormal levels of other serum enzymes: Secondary | ICD-10-CM | POA: Diagnosis present

## 2023-01-04 DIAGNOSIS — D3A8 Other benign neuroendocrine tumors: Secondary | ICD-10-CM | POA: Diagnosis present

## 2023-01-04 DIAGNOSIS — Z825 Family history of asthma and other chronic lower respiratory diseases: Secondary | ICD-10-CM | POA: Diagnosis not present

## 2023-01-04 DIAGNOSIS — N179 Acute kidney failure, unspecified: Secondary | ICD-10-CM | POA: Diagnosis present

## 2023-01-04 DIAGNOSIS — Z8249 Family history of ischemic heart disease and other diseases of the circulatory system: Secondary | ICD-10-CM | POA: Diagnosis not present

## 2023-01-04 DIAGNOSIS — Z6828 Body mass index (BMI) 28.0-28.9, adult: Secondary | ICD-10-CM | POA: Diagnosis not present

## 2023-01-04 DIAGNOSIS — I10 Essential (primary) hypertension: Secondary | ICD-10-CM | POA: Diagnosis present

## 2023-01-04 DIAGNOSIS — Z79899 Other long term (current) drug therapy: Secondary | ICD-10-CM | POA: Diagnosis not present

## 2023-01-04 DIAGNOSIS — I480 Paroxysmal atrial fibrillation: Secondary | ICD-10-CM | POA: Diagnosis present

## 2023-01-04 DIAGNOSIS — Z833 Family history of diabetes mellitus: Secondary | ICD-10-CM

## 2023-01-04 DIAGNOSIS — Z933 Colostomy status: Secondary | ICD-10-CM | POA: Diagnosis not present

## 2023-01-04 LAB — URINALYSIS, COMPLETE (UACMP) WITH MICROSCOPIC
Bilirubin Urine: NEGATIVE
Glucose, UA: 150 mg/dL — AB
Hgb urine dipstick: NEGATIVE
Ketones, ur: NEGATIVE mg/dL
Leukocytes,Ua: NEGATIVE
Nitrite: NEGATIVE
Protein, ur: 30 mg/dL — AB
Specific Gravity, Urine: 1.018 (ref 1.005–1.030)
pH: 5 (ref 5.0–8.0)

## 2023-01-04 LAB — GLUCOSE, CAPILLARY
Glucose-Capillary: 338 mg/dL — ABNORMAL HIGH (ref 70–99)
Glucose-Capillary: 240 mg/dL — ABNORMAL HIGH (ref 70–99)
Glucose-Capillary: 201 mg/dL — ABNORMAL HIGH (ref 70–99)
Glucose-Capillary: 190 mg/dL — ABNORMAL HIGH (ref 70–99)
Glucose-Capillary: 177 mg/dL — ABNORMAL HIGH (ref 70–99)
Glucose-Capillary: 181 mg/dL — ABNORMAL HIGH (ref 70–99)
Glucose-Capillary: 172 mg/dL — ABNORMAL HIGH (ref 70–99)
Glucose-Capillary: 173 mg/dL — ABNORMAL HIGH (ref 70–99)
Glucose-Capillary: 133 mg/dL — ABNORMAL HIGH (ref 70–99)
Glucose-Capillary: 142 mg/dL — ABNORMAL HIGH (ref 70–99)

## 2023-01-04 LAB — BASIC METABOLIC PANEL
Anion gap: 18 — ABNORMAL HIGH (ref 5–15)
Anion gap: 14 (ref 5–15)
Anion gap: 12 (ref 5–15)
BUN: 99 mg/dL — ABNORMAL HIGH (ref 8–23)
BUN: 98 mg/dL — ABNORMAL HIGH (ref 8–23)
BUN: 98 mg/dL — ABNORMAL HIGH (ref 8–23)
CO2: 15 mmol/L — ABNORMAL LOW (ref 22–32)
CO2: 15 mmol/L — ABNORMAL LOW (ref 22–32)
CO2: 19 mmol/L — ABNORMAL LOW (ref 22–32)
Calcium: 10.7 mg/dL — ABNORMAL HIGH (ref 8.9–10.3)
Calcium: 10.4 mg/dL — ABNORMAL HIGH (ref 8.9–10.3)
Calcium: 10.4 mg/dL — ABNORMAL HIGH (ref 8.9–10.3)
Chloride: 94 mmol/L — ABNORMAL LOW (ref 98–111)
Chloride: 97 mmol/L — ABNORMAL LOW (ref 98–111)
Chloride: 101 mmol/L (ref 98–111)
Creatinine, Ser: 3.26 mg/dL — ABNORMAL HIGH (ref 0.61–1.24)
Creatinine, Ser: 2.92 mg/dL — ABNORMAL HIGH (ref 0.61–1.24)
Creatinine, Ser: 2.97 mg/dL — ABNORMAL HIGH (ref 0.61–1.24)
GFR, Estimated: 20 mL/min — ABNORMAL LOW (ref >60)
GFR, Estimated: 23 mL/min — ABNORMAL LOW (ref >60)
GFR, Estimated: 22 mL/min — ABNORMAL LOW (ref >60)
Glucose, Bld: 248 mg/dL — ABNORMAL HIGH (ref 70–99)
Glucose, Bld: 178 mg/dL — ABNORMAL HIGH (ref 70–99)
Glucose, Bld: 149 mg/dL — ABNORMAL HIGH (ref 70–99)
Potassium: 4.3 mmol/L (ref 3.5–5.1)
Potassium: 4.1 mmol/L (ref 3.5–5.1)
Potassium: 4.3 mmol/L (ref 3.5–5.1)
Sodium: 127 mmol/L — ABNORMAL LOW (ref 135–145)
Sodium: 126 mmol/L — ABNORMAL LOW (ref 135–145)
Sodium: 132 mmol/L — ABNORMAL LOW (ref 135–145)

## 2023-01-04 LAB — CBG MONITORING, ED
Glucose-Capillary: 406 mg/dL — ABNORMAL HIGH (ref 70–99)
Glucose-Capillary: 394 mg/dL — ABNORMAL HIGH (ref 70–99)

## 2023-01-04 LAB — COMPREHENSIVE METABOLIC PANEL
ALT: 26 U/L (ref 0–44)
AST: 31 U/L (ref 15–41)
Albumin: 3.7 g/dL (ref 3.5–5.0)
Alkaline Phosphatase: 114 U/L (ref 38–126)
Anion gap: 20 — ABNORMAL HIGH (ref 5–15)
BUN: 99 mg/dL — ABNORMAL HIGH (ref 8–23)
CO2: 16 mmol/L — ABNORMAL LOW (ref 22–32)
Calcium: 11.1 mg/dL — ABNORMAL HIGH (ref 8.9–10.3)
Chloride: 91 mmol/L — ABNORMAL LOW (ref 98–111)
Creatinine, Ser: 3.79 mg/dL — ABNORMAL HIGH (ref 0.61–1.24)
GFR, Estimated: 17 mL/min — ABNORMAL LOW (ref >60)
Glucose, Bld: 394 mg/dL — ABNORMAL HIGH (ref 70–99)
Potassium: 4.5 mmol/L (ref 3.5–5.1)
Sodium: 127 mmol/L — ABNORMAL LOW (ref 135–145)
Total Bilirubin: 0.6 mg/dL (ref 0.3–1.2)
Total Protein: 9.9 g/dL — ABNORMAL HIGH (ref 6.5–8.1)

## 2023-01-04 LAB — CBC
HCT: 44.8 % (ref 39.0–52.0)
Hemoglobin: 15.2 g/dL (ref 13.0–17.0)
MCH: 29.3 pg (ref 26.0–34.0)
MCHC: 33.9 g/dL (ref 30.0–36.0)
MCV: 86.5 fL (ref 80.0–100.0)
Platelets: 300 10*3/uL (ref 150–400)
RBC: 5.18 MIL/uL (ref 4.22–5.81)
RDW: 14.5 % (ref 11.5–15.5)
WBC: 21.9 10*3/uL — ABNORMAL HIGH (ref 4.0–10.5)
nRBC: 0 % (ref 0.0–0.2)

## 2023-01-04 LAB — MRSA NEXT GEN BY PCR, NASAL: MRSA by PCR Next Gen: NOT DETECTED

## 2023-01-04 LAB — BLOOD GAS, VENOUS
Acid-base deficit: 9.5 mmol/L — ABNORMAL HIGH (ref 0.0–2.0)
Bicarbonate: 18 mmol/L — ABNORMAL LOW (ref 20.0–28.0)
O2 Saturation: 43.1 %
Patient temperature: 37
pCO2, Ven: 44 mmHg (ref 44–60)
pH, Ven: 7.22 — ABNORMAL LOW (ref 7.25–7.43)
pO2, Ven: 32 mmHg (ref 32–45)

## 2023-01-04 LAB — HEMOGLOBIN A1C
Hgb A1c MFr Bld: 6.9 % — ABNORMAL HIGH (ref 4.8–5.6)
Mean Plasma Glucose: 151.33 mg/dL

## 2023-01-04 LAB — RESP PANEL BY RT-PCR (RSV, FLU A&B, COVID)  RVPGX2
Influenza A by PCR: NEGATIVE
Influenza B by PCR: NEGATIVE
Resp Syncytial Virus by PCR: NEGATIVE
SARS Coronavirus 2 by RT PCR: NEGATIVE

## 2023-01-04 LAB — BETA-HYDROXYBUTYRIC ACID
Beta-Hydroxybutyric Acid: 1.95 mmol/L — ABNORMAL HIGH (ref 0.05–0.27)
Beta-Hydroxybutyric Acid: 0.22 mmol/L (ref 0.05–0.27)

## 2023-01-04 LAB — LIPASE, BLOOD: Lipase: 142 U/L — ABNORMAL HIGH (ref 11–51)

## 2023-01-04 MED ORDER — ONDANSETRON HCL 4 MG/2ML IJ SOLN
4.0000 mg | Freq: Once | INTRAMUSCULAR | Status: AC
  Administered 2023-01-04: 4 mg via INTRAVENOUS

## 2023-01-04 MED ORDER — ONDANSETRON HCL 4 MG/2ML IJ SOLN: 4.0000 mg | Freq: Four times a day (QID) | INTRAMUSCULAR | Status: DC | PRN

## 2023-01-04 MED ORDER — FLECAINIDE ACETATE 50 MG PO TABS
150.0000 mg | ORAL_TABLET | Freq: Two times a day (BID) | ORAL | Status: DC
  Administered 2023-01-04 – 2023-01-06 (×4): 150 mg via ORAL
  Filled 2023-01-04 (×4): qty 1

## 2023-01-04 MED ORDER — POLYETHYLENE GLYCOL 3350 17 G PO PACK: 17.0000 g | PACK | Freq: Every day | ORAL | Status: DC | PRN

## 2023-01-04 MED ORDER — ONDANSETRON HCL 4 MG PO TABS: 4.0000 mg | ORAL_TABLET | Freq: Four times a day (QID) | ORAL | Status: DC | PRN

## 2023-01-04 MED ORDER — DEXTROSE 50 % IV SOLN
0.0000 mL | INTRAVENOUS | Status: DC | PRN
  Filled 2023-01-04: qty 50

## 2023-01-04 MED ORDER — ADULT MULTIVITAMIN W/MINERALS CH: 1.0000 | ORAL_TABLET | ORAL | Status: DC

## 2023-01-04 MED ORDER — ACETAMINOPHEN 325 MG PO TABS: 650.0000 mg | ORAL_TABLET | Freq: Four times a day (QID) | ORAL | Status: DC | PRN

## 2023-01-04 MED ORDER — METOPROLOL TARTRATE 50 MG PO TABS: 100.0000 mg | ORAL_TABLET | Freq: Two times a day (BID) | ORAL | Status: DC

## 2023-01-04 MED ORDER — CHLORHEXIDINE GLUCONATE CLOTH 2 % EX PADS: 6.0000 | MEDICATED_PAD | Freq: Every day | CUTANEOUS | Status: DC

## 2023-01-04 MED ORDER — ORAL CARE MOUTH RINSE: 15.0000 mL | OROMUCOSAL | Status: DC | PRN

## 2023-01-04 MED ORDER — SODIUM CHLORIDE 0.9 % IV SOLN: Freq: Once | INTRAVENOUS | Status: AC

## 2023-01-04 MED ORDER — ONDANSETRON HCL 4 MG/2ML IJ SOLN
INTRAMUSCULAR | Status: AC
  Filled 2023-01-04: qty 2

## 2023-01-04 MED ORDER — LACTATED RINGERS IV SOLN: INTRAVENOUS | Status: DC

## 2023-01-04 MED ORDER — DEXTROSE IN LACTATED RINGERS 5 % IV SOLN: INTRAVENOUS | Status: AC

## 2023-01-04 MED ORDER — COQ10 100 MG PO CAPS: 100.0000 mg | ORAL_CAPSULE | Freq: Every day | ORAL | Status: DC

## 2023-01-04 MED ORDER — VITAMIN D 25 MCG (1000 UNIT) PO TABS
2000.0000 [IU] | ORAL_TABLET | ORAL | Status: DC
  Administered 2023-01-06: 2000 [IU] via ORAL
  Filled 2023-01-04: qty 2

## 2023-01-04 MED ORDER — VITAMIN B-12 1000 MCG PO TABS
500.0000 ug | ORAL_TABLET | ORAL | Status: DC
  Administered 2023-01-06: 500 ug via ORAL
  Filled 2023-01-04: qty 1

## 2023-01-04 MED ORDER — INSULIN REGULAR(HUMAN) IN NACL 100-0.9 UT/100ML-% IV SOLN
INTRAVENOUS | Status: AC
  Administered 2023-01-04: 11.5 [IU]/h via INTRAVENOUS
  Filled 2023-01-04: qty 100

## 2023-01-04 MED ORDER — METOPROLOL TARTRATE 50 MG PO TABS
100.0000 mg | ORAL_TABLET | Freq: Two times a day (BID) | ORAL | Status: DC
  Administered 2023-01-04 – 2023-01-05 (×2): 100 mg via ORAL
  Filled 2023-01-04 (×2): qty 2

## 2023-01-04 MED ORDER — ACETAMINOPHEN 650 MG RE SUPP: 650.0000 mg | Freq: Four times a day (QID) | RECTAL | Status: DC | PRN

## 2023-01-04 MED ORDER — HYDRALAZINE HCL 20 MG/ML IJ SOLN: 5.0000 mg | INTRAMUSCULAR | Status: DC | PRN

## 2023-01-04 MED ORDER — POTASSIUM CHLORIDE 10 MEQ/100ML IV SOLN
10.0000 meq | INTRAVENOUS | Status: AC
  Administered 2023-01-04: 10 meq via INTRAVENOUS
  Filled 2023-01-04: qty 100

## 2023-01-04 MED ORDER — APIXABAN 5 MG PO TABS
5.0000 mg | ORAL_TABLET | Freq: Two times a day (BID) | ORAL | Status: DC
  Administered 2023-01-04 – 2023-01-06 (×4): 5 mg via ORAL
  Filled 2023-01-04 (×4): qty 1

## 2023-01-04 NOTE — ED Triage Notes (Signed)
Patient reports N/V for 2 days; denies abdominal pain

## 2023-01-04 NOTE — Patient Instructions (Signed)
Your procedure is scheduled on: Tuesday January 12, 2023. Report to Day Surgery inside [facility] 2nd floor, stop by registration desk before getting on elevator. To find out your arrival time please call [phone] between 1PM - 3PM on Monday January 11, 2023.  Remember: Instructions that are not followed completely may result in serious medical risk,  up to and including death, or upon the discretion of your surgeon and anesthesiologist your  surgery may need to be rescheduled.     _X__ 1. Do not eat food after midnight the night before your procedure.                 No chewing gum or hard candies. You may drink clear liquids up to 2 hours                 before you are scheduled to arrive for your surgery- DO not drink clear                 liquids within 2 hours of the start of your surgery.                 Clear Liquids include:  water.  __X__2.  On the morning of surgery brush your teeth with toothpaste and water, you                may rinse your mouth with mouthwash if you wish.  Do not swallow any toothpaste or mouthwash.     _X__ 3.  No Alcohol for 24 hours before or after surgery.   _X__ 4.  Do Not Smoke or use e-cigarettes For 24 Hours Prior to Your Surgery.                 Do not use any chewable tobacco products for at least 6 hours prior to                 Surgery.  _X__  5.  Do not use any recreational drugs (marijuana, cocaine, heroin, ecstasy, MDMA or other)                For at least one week prior to your surgery.  Combination of these drugs with anesthesia                May have life threatening results.  ____  6.  Bring all medications with you on the day of surgery if instructed.   __X__  7.  Notify your doctor if there is any change in your medical condition      (cold, fever, infections).     Do not wear jewelry, make-up, hairpins, clips or nail polish. Do not wear lotions, powders, or perfumes. You may wear deodorant. Do not  shave 48 hours prior to surgery. Men may shave face and neck. Do not bring valuables to the hospital.    [facility] is not responsible for any belongings or valuables.  Contacts, dentures or bridgework may not be worn into surgery. Leave your suitcase in the car. After surgery it may be brought to your room. For patients admitted to the hospital, discharge time is determined by your treatment team.   Patients discharged the day of surgery will not be allowed to drive home.   Make arrangements for someone to be with you for the first 24 hours of your Same Day Discharge.   __X__ Take these medicines the morning of surgery with A SIP OF WATER:  1. flecainide (TAMBOCOR) 150 MG   2. metoprolol (LOPRESSOR) 100 MG   3.   4.  5.  6.  ____ Fleet Enema (as directed)   __X__ Use CHG Soap (or wipes) as directed  ____ Use Benzoyl Peroxide Gel as instructed  ____ Use inhalers on the day of surgery  ____ Stop metformin 2 days prior to surgery    ____ Take 1/2 of usual insulin dose the night before surgery. No insulin the morning          of surgery.   ____ Call your PCP, cardiologist, or Pulmonologist if taking Coumadin/Plavix/aspirin and ask when to stop before your surgery.   __X__ One Week prior to surgery- Stop Anti-inflammatories such as Ibuprofen, Aleve, Advil, Motrin, meloxicam (MOBIC), diclofenac, etodolac, ketorolac, Toradol, Daypro, piroxicam, Goody's or BC powders. OK TO USE TYLENOL IF NEEDED   __X__ One week prior to surgery stop ALL vitamins and or supplements until after surgery. Vitamin E,  Turmeric, Omega-3 Fatty Acids (FISH OIL)  multivitamin and COQ-10.  ____ Bring C-Pap to the hospital.    If you have any questions regarding your pre-procedure instructions,  Please call Pre-admit Testing at [phone]    Preparing for Surgery with CHLORHEXIDINE GLUCONATE (CHG) Soap  Chlorhexidine Gluconate (CHG) Soap  o An antiseptic cleaner that kills germs and bonds  with the skin to continue killing germs even after washing  o Used for showering the night before surgery and morning of surgery  Before surgery, you can play an important role by reducing the number of germs on your skin.  CHG (Chlorhexidine gluconate) soap is an antiseptic cleanser which kills germs and bonds with the skin to continue killing germs even after washing.  Please do not use if you have an allergy to CHG or antibacterial soaps. If your skin becomes reddened/irritated stop using the CHG.  1. Shower the NIGHT BEFORE SURGERY and the MORNING OF SURGERY with CHG soap.  2. If you choose to wash your hair, wash your hair first as usual with your normal shampoo.  3. After shampooing, rinse your hair and body thoroughly to remove the shampoo.  4. Use CHG as you would any other liquid soap. You can apply CHG directly to the skin and wash gently with a scrungie or a clean washcloth.  5. Apply the CHG soap to your body only from the neck down. Do not use on open wounds or open sores. Avoid contact with your eyes, ears, mouth, and genitals (private parts). Wash face and genitals (private parts) with your normal soap.  6. Wash thoroughly, paying special attention to the area where your surgery will be performed.  7. Thoroughly rinse your body with warm water.  8. Do not shower/wash with your normal soap after using and rinsing off the CHG soap.  9. Pat yourself dry with a clean towel.  10. Wear clean pajamas to bed the night before surgery.  12. Place clean sheets on your bed the night of your first shower and do not sleep with pets.  13. Shower again with the CHG soap on the day of surgery prior to arriving at the hospital.  14. Do not apply any deodorants/lotions/powders.  15. Please wear clean clothes to the hospital.

## 2023-01-04 NOTE — Assessment & Plan Note (Signed)
No acute concern. -Continue home flecainide, metoprolol and Eliquis

## 2023-01-04 NOTE — Assessment & Plan Note (Signed)
Patient with history of neuroendocrine carcinoma of colon s/p surgery and diversion colostomy, planning to start palliative chemotherapy next month. -Colostomy care -Continue with outpatient management and follow-up

## 2023-01-04 NOTE — Assessment & Plan Note (Signed)
History of type 2 diabetes mellitus: Patient was recently backed off from some of his diabetes medications due to weight loss.  Currently was just taking glipizide and dose was also decreased to 5 mg daily. Not checking his blood glucose level regularly. Initial labs consistent with DKA, VBG with mild metabolic acidosis. -Patient was started on Endo tool -Admit to stepdown -Once gap closed x 2-he should receive at least 10 units of basal and started on SSI

## 2023-01-04 NOTE — Assessment & Plan Note (Signed)
Per chart review patient with gradual worsening of renal function for the past 1 month.  Up till November 2023 his creatinine was less than 1.  Current creatinine at 3.79 Appears dehydrated and can be related to DKA. -Renal ultrasound -Monitor renal function -Avoid nephrotoxins

## 2023-01-04 NOTE — Assessment & Plan Note (Signed)
Most likely reactive with DKA.  No other obvious infection. -Check UA -Continue to monitor

## 2023-01-04 NOTE — Inpatient Diabetes Management (Signed)
Inpatient Diabetes Program Recommendations  AACE/ADA: New Consensus Statement on Inpatient Glycemic Control   Target Ranges:  Prepandial:   less than 140 mg/dL      Peak postprandial:   less than 180 mg/dL (1-2 hours)      Critically ill patients:  140 - 180 mg/dL    Latest Reference Range & Units 01/04/23 09:37  CO2 22 - 32 mmol/L 16 (L)  Glucose 70 - 99 mg/dL 394 (H)  Anion gap 5 - 15  20 (H)    Review of Glycemic Control  Diabetes history: DM2 Outpatient Diabetes medications: Glipizide XL 10 mg BID Current orders for Inpatient glycemic control: IV insulin  Inpatient Diabetes Program Recommendations:    Insulin: Noted labs indicating acidosis and IV insulin ordered. IV insulin should be continued until acidosis is resolved.   NOTE: Patient in ED with N/V and abdominal pain for 2 days with initial lab glucose of 394 mg/dl. Per chart review, noted patient recently dx with neuroendocrine colon cancer with metastasis. Noted patient seen his PCP on 11/23/22 and per note patient was having hypoglycemia and was asked to decrease Glipizide XL to 5 mg daily. Current home medication list has Glipizide XL 10 mg BID.   Thanks, Barnie Alderman, RN, MSN, [place] Diabetes Coordinator Inpatient Diabetes Program [phone] (Team Pager from 8am to [place])

## 2023-01-04 NOTE — Progress Notes (Signed)
PHARMACIST - PHYSICIAN ORDER COMMUNICATION  CONCERNING: P&T Medication Policy on Herbal Medications  DESCRIPTION:  This patient's order for:  CoQ 10 capsules  has been noted.  This product(s) is classified as an "herbal" or natural product. Due to a lack of definitive safety studies or FDA approval, nonstandard manufacturing practices, plus the potential risk of unknown drug-drug interactions while on inpatient medications, the Pharmacy and Therapeutics Committee does not permit the use of "herbal" or natural products of this type within [facility].   ACTION TAKEN: The pharmacy department is unable to verify this order at this time and your patient has been informed of this safety policy. Please reevaluate patient's clinical condition at discharge and address if the herbal or natural product(s) should be resumed at that time.   Pearla Dubonnet, PharmD Clinical Pharmacist 01/04/2023 3:18 PM

## 2023-01-04 NOTE — H&P (Signed)
History and Physical    Patient: Nicolas Zuniga WUX:[id number] DOB: [birth year] DOA: 01/04/2023 DOS: the patient was seen and examined on 01/04/2023 PCP: Hortencia Pilar, MD  Patient coming from: Home  Chief Complaint:  Chief Complaint  Patient presents with   Nausea   Emesis   HPI: Nicolas Zuniga is a 67 y.o. 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  Patient was recently backed off for some of his diabetes medications with concern of weight loss with recent diagnosis of malignancy and couple of readings of hypoglycemia.  Currently only taking glipizide 5 mg daily and was not checking his blood glucose level regularly.  Developed gradually worsening nausea and vomiting on Saturday, unable to tolerate much p.o. no abdominal pain, no recent illnesses or sick contacts.  Denies any chest pain, shortness of breath or upper respiratory symptoms.  No diarrhea or change in colostomy secretions.  No urinary symptoms.  Of note patient had a fall few days ago resulted in some laceration of arms which does not look infected.  ED course.  Vital stable with mildly elevated blood pressure at 147/81.  Labs pertinent for leukocytosis at 21.9, pseudohyponatremia secondary to hyperglycemia, blood glucose of 394, bicarb of 16 and anion gap of 20, creatinine 3.79 with BUN of 99 and GFR of 17.  Mildly elevated lipase at 142.  Respiratory panel negative. EKG.  Personally reviewed.  Normal sinus rhythm with left axis deviation, Q waves in inferoanterior leads.  Patient was started on Endo tool and [facility] was consulted for admission. Pulm Review of Systems: As mentioned in the history of present illness. All other systems reviewed and are negative. 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/08/2022   Cholelithiasis    Chronic gastritis    Colon polyps    Coronary artery calcification seen on CT scan    Diastolic dysfunction    a.) TTE 04/09/2022: EF >55%, triv TR, G1DD   Diverticulosis    Hypertension    Long term current use of anticoagulant    a.) apixaban   Nephrolithiasis    Paroxysmal A-fib ([place])    a.) CHA2DS2VASc = 3 (age, HTN, T2DM);  b.) s/p DCCV 07/09/2016 (100 J x1), 12/22/2018 (120 J x1), 08/18/2021 (120 J x1), 03/11/2022 (120 J x1), 03/31/2022 (120 J x1); c.) rate/rhythm maintained on oral flecanide + metoprolol succinate; chronically anticoagulated with apixaban   T2DM (type 2 diabetes mellitus) ([place])    Past Surgical History:  Procedure Laterality Date   CARDIOVERSION N/A 12/22/2018   Procedure: CARDIOVERSION (CATH LAB);  Surgeon: Corey Skains, MD;  Location: [facility] ORS;  Service: Cardiovascular;  Laterality: N/A;   CARDIOVERSION N/A 08/18/2021   Procedure: CARDIOVERSION;  Surgeon: Corey Skains, MD;  Location: [facility] ORS;  Service: Cardiovascular;  Laterality: N/A;   CARDIOVERSION N/A 03/11/2022   Procedure: CARDIOVERSION;  Surgeon: Corey Skains, MD;  Location: [facility] ORS;  Service: Cardiovascular;  Laterality: N/A;   CARDIOVERSION N/A 03/31/2022   Procedure: CARDIOVERSION;  Surgeon: Corey Skains, MD;  Location: [facility] ORS;  Service: Cardiovascular;  Laterality: N/A;   CHOLECYSTECTOMY N/A 09/24/2022   Procedure: LAPAROSCOPIC CHOLECYSTECTOMY;  Surgeon: Jules Husbands, MD;  Location: [facility] ORS;  Service: General;  Laterality: N/A;  COLONOSCOPY WITH PROPOFOL N/A 09/08/2022   Procedure: COLONOSCOPY WITH PROPOFOL;  Surgeon: Lucilla Lame, MD;  Location: [facility] ENDOSCOPY;  Service: Endoscopy;  Laterality: N/A;   ELECTROPHYSIOLOGIC STUDY N/A 07/09/2016   Procedure: CARDIOVERSION;  Surgeon: Dionisio David, MD;  Location: [facility] ORS;  Service: Cardiovascular;  Laterality: N/A;   LAPAROSCOPIC RIGHT  COLECTOMY N/A 09/24/2022   Procedure: LAPAROSCOPIC RIGHT COLECTOMY, RNFA to assist;  Surgeon: Jules Husbands, MD;  Location: [facility] ORS;  Service: General;  Laterality: N/A;   LAPAROTOMY N/A 09/28/2022   Procedure: EXPLORATORY LAPAROTOMY WITH OSTOMY CREATION;  Surgeon: Jules Husbands, MD;  Location: [facility] ORS;  Service: General;  Laterality: N/A;   TEE WITHOUT CARDIOVERSION N/A 07/09/2016   Procedure: TRANSESOPHAGEAL ECHOCARDIOGRAM (TEE);  Surgeon: Dionisio David, MD;  Location: [facility] ORS;  Service: Cardiovascular;  Laterality: N/A;   web fingers repaired     as a child   Social History:  reports that he has never smoked. He has never been exposed to tobacco smoke. He has never used smokeless tobacco. He reports that he does not drink alcohol and does not use drugs.  No Known Allergies  Family History  Problem Relation Age of Onset   Varicose Veins Mother    Heart disease Mother    Diabetes Mother    COPD Father     Prior to Admission medications   Medication Sig Start Date End Date Taking? Authorizing Provider  apixaban (ELIQUIS) 5 MG TABS tablet Take 5 mg by mouth 2 (two) times daily.   Yes [provider]  Cholecalciferol (VITAMIN D) 50 MCG (2000 UT) CAPS Take 2,000 Units by mouth every other day.   Yes [provider]  Coenzyme Q10 (COQ10) 100 MG CAPS Take 100 mg by mouth daily.   Yes [provider]  flecainide (TAMBOCOR) 150 MG tablet Take 1 tablet by mouth 2 (two) times daily. 12/14/22 12/14/23 Yes [provider]  glipiZIDE (GLUCOTROL XL) 5 MG 24 hr tablet Take 5 mg by mouth daily.   Yes [provider]  metoprolol (LOPRESSOR) 100 MG tablet Take 100 mg by mouth 2 (two) times daily.   Yes [provider]  Multiple Vitamins-Minerals (MULTIVITAMIN WITH MINERALS) tablet Take 1 tablet by mouth every other day.   Yes [provider]  Omega-3 Fatty Acids (FISH OIL) 1000 MG CAPS Take 1,000 mg by mouth every other day.   Yes  [provider]  Turmeric 400 MG CAPS Take 400 mg by mouth every other day.   Yes [provider]  vitamin B-12 (CYANOCOBALAMIN) 100 MCG tablet Take 500 mcg by mouth every other day.   Yes [provider]  Vitamin E 400 units TABS Take 400 Units by mouth every other day.   Yes [provider]  lidocaine (LIDODERM) 5 % Place 1 patch onto the skin every 12 (twelve) hours. Remove & Discard patch within 12 hours or as directed by MD Patient not taking: Reported on 12/29/2022 10/28/22 10/28/23  Jules Husbands, MD  loperamide (IMODIUM) 2 MG capsule Take 2 capsules (4 mg total) by mouth 3 (three) times daily. Patient not taking: Reported on 12/29/2022 10/16/22   Tylene Fantasia, [place]    Physical Exam: Vitals:   01/04/23 1230 01/04/23 1234 01/04/23 1315 01/04/23 1330  BP:  (!) 150/80 (!) 148/81 (!) 147/81  Pulse: 84  95 93  Resp: (!) 22  (!) 23 (!) 22  Temp:   98.3 F (36.8 C)   TempSrc:  Oral   SpO2: 100%  100% 97%  Weight:      Height:       Vitals:   01/04/23 1230 01/04/23 1234 01/04/23 1315 01/04/23 1330  BP:  (!) 150/80 (!) 148/81 (!) 147/81  Pulse: 84  95 93  Resp: (!) 22  (!) 23 (!) 22  Temp:   98.3 F (36.8 C)   TempSrc:   Oral   SpO2: 100%  100% 97%  Weight:      Height:       General: Vital signs reviewed.  Patient is well-developed and well-nourished, in no acute distress and cooperative with exam.  Head: Normocephalic and atraumatic. Eyes: EOMI, conjunctivae normal, no scleral icterus.  Neck: Supple, trachea midline, normal ROM, no JVD, Cardiovascular: RRR, S1 normal, S2 normal, no murmurs, gallops, or rubs. Pulmonary/Chest: Clear to auscultation bilaterally, no wheezes, rales, or rhonchi. Abdominal: Soft, non-tender, non-distended, BS +, Extremities: No lower extremity edema bilaterally,  pulses symmetric and intact bilaterally. No cyanosis or clubbing. Neurological: A&O x3, Strength is normal and symmetric bilaterally, cranial  nerve II-XII are grossly intact, no focal motor deficit, sensory intact to light touch bilaterally.  Skin: Warm, dry and intact. No rashes or erythema. Psychiatric: Normal mood and affect.   Data Reviewed: Prior data reviewed as mentioned above.  Assessment and Plan: * DKA (diabetic ketoacidosis) ([place]) History of type 2 diabetes mellitus: Patient was recently backed off from some of his diabetes medications due to weight loss.  Currently was just taking glipizide and dose was also decreased to 5 mg daily. Not checking his blood glucose level regularly. Initial labs consistent with DKA, VBG with mild metabolic acidosis. -Patient was started on Endo tool -Admit to stepdown -Once gap closed x 2-he should receive at least 10 units of basal and started on SSI  AKI (acute kidney injury) ([place]) Per chart review patient with gradual worsening of renal function for the past 1 month.  Up till November 2023 his creatinine was less than 1.  Current creatinine at 3.79 Appears dehydrated and can be related to DKA. -Renal ultrasound -Monitor renal function -Avoid nephrotoxins  Paroxysmal A-fib ([facility]) No acute concern. -Continue home flecainide, metoprolol and Eliquis  Essential hypertension -Continue home metoprolol -As needed hydralazine  Neuroendocrine carcinoma of colon ([place]) Patient with history of neuroendocrine carcinoma of colon s/p surgery and diversion colostomy, planning to start palliative chemotherapy next month. -Colostomy care -Continue with outpatient management and follow-up  Leukocytosis Most likely reactive with DKA.  No other obvious infection. -Check UA -Continue to monitor     Advance Care Planning:   Code Status: Full Code discussed with patient and wife at bedside.  Per patient and wife they are not ready at this time to change his status to DNR and would like to see the results of chemo before making any other decisions.  Consults: None  Family Communication:  Discussed with wife at bedside  Severity of Illness: The appropriate patient status for this patient is INPATIENT. Inpatient status is judged to be reasonable and necessary in order to provide the required intensity of service to ensure the patient's safety. The patient's presenting symptoms, physical exam findings, and initial radiographic and laboratory data in the context of their chronic comorbidities is felt to place them at high risk for further clinical deterioration. Furthermore, it is not anticipated that the patient will be medically stable for discharge from the hospital within 2 midnights of admission.   * I certify that at the  point of admission it is my clinical judgment that the patient will require inpatient hospital care spanning beyond 2 midnights from the point of admission due to high intensity of service, high risk for further deterioration and high frequency of surveillance required.*  This record has been created using Systems analyst. Errors have been sought and corrected,but may not always be located. Such creation errors do not reflect on the standard of care.   Author: Lorella Nimrod, MD 01/04/2023 1:51 PM  For on call review [email or web address].

## 2023-01-04 NOTE — Assessment & Plan Note (Signed)
-  Continue home metoprolol -As needed hydralazine

## 2023-01-04 NOTE — Progress Notes (Signed)
[facility] [place] for Electrolyte Monitoring and Replacement   Recent Labs: Potassium (mmol/L)  Date Value  01/04/2023 4.5  04/05/2014 4.4   Magnesium (mg/dL)  Date Value  10/19/2022 1.8  06/19/2012 1.8   Calcium (mg/dL)  Date Value  01/04/2023 11.1 (H)   Calcium, Total (mg/dL)  Date Value  04/05/2014 9.7   Albumin (g/dL)  Date Value  01/04/2023 3.7  04/05/2014 4.0   Phosphorus (mg/dL)  Date Value  10/19/2022 3.0  06/16/2012 2.7   Sodium (mmol/L)  Date Value  01/04/2023 127 (L)  04/05/2014 136     Assessment: 67 year old male admitted with DKA. 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   Anion gap of 20 on IV insulin infusion at 6.5 units/hr.  Kcl 10 mEq x 2 runs given '@1115'$ .  Goal of Therapy:  K+ > 4 (K+ 4-5 while on IV insulin) Mag > 2 All other electrolytes within normal limits  Plan:  Continue BMP every 4 hours while on insulin infusion.   Glean Salvo, PharmD, BCPS Clinical Pharmacist  01/04/2023 3:58 PM

## 2023-01-04 NOTE — ED Provider Notes (Addendum)
[facility] Provider Note    Event Date/Time   First MD Initiated Contact with Patient 01/04/23 [phone]     (approximate)   History   Nausea and Emesis   HPI  Nicolas Zuniga is a 67 y.o. male with a recent diagnosis of neuroendocrine colon cancer with metastases status post surgery who presents with complaints of nausea and vomiting.  Patient reports 2 days ago he started to have nausea he has had vomiting, he feels fatigued and weak.  Colostomy is functioning normally.  No fevers reported.  He has not started chemotherapy.  No abdominal pain reported     Physical Exam   Triage Vital Signs: ED Triage Vitals [01/04/23 0928]  Enc Vitals Group     BP (!) 154/86     Pulse Rate 87     Resp 20     Temp 97.9 F (36.6 C)     Temp Source Oral     SpO2 100 %     Weight 101.3 kg (223 lb 6.4 oz)     Height 1.88 m ('6\' 2"'$ )     Head Circumference      Peak Flow      Pain Score      Pain Loc      Pain Edu?      Excl. in [place]?     Most recent vital signs: Vitals:   01/04/23 0928  BP: (!) 154/86  Pulse: 87  Resp: 20  Temp: 97.9 F (36.6 C)  SpO2: 100%     General: Awake, no distress.  CV:  Good peripheral perfusion.  Resp:  Normal effort.  Abd:  No distention.  Colostomy full of brown stool, no abdominal tenderness palpation Other:     ED Results / Procedures / Treatments   Labs (all labs ordered are listed, but only abnormal results are displayed) Labs Reviewed  COMPREHENSIVE METABOLIC PANEL - Abnormal; Notable for the following components:      Result Value   Sodium 127 (*)    Chloride 91 (*)    CO2 16 (*)    Glucose, Bld 394 (*)    BUN 99 (*)    Creatinine, Ser 3.79 (*)    Calcium 11.1 (*)    Total Protein 9.9 (*)    GFR, Estimated 17 (*)    Anion gap 20 (*)    All other components within normal limits  CBC - Abnormal; Notable for the following components:   WBC 21.9 (*)    All other components within normal limits  LIPASE, BLOOD -  Abnormal; Notable for the following components:   Lipase 142 (*)    All other components within normal limits  RESP PANEL BY RT-PCR (RSV, FLU A&B, COVID)  RVPGX2  BETA-HYDROXYBUTYRIC ACID  BETA-HYDROXYBUTYRIC ACID  BLOOD GAS, VENOUS  CBG MONITORING, ED     EKG  ED ECG REPORT I, Lavonia Drafts, the attending physician, personally viewed and interpreted this ECG.  Date: 01/04/2023  Rhythm: normal sinus rhythm QRS Axis: normal Intervals: normal ST/T Wave abnormalities: normal Narrative Interpretation: no evidence of acute ischemia    RADIOLOGY     PROCEDURES:  Critical Care performed: yes  CRITICAL CARE Performed by: Lavonia Drafts   Total critical care time: 30 minutes  Critical care time was exclusive of separately billable procedures and treating other patients.  Critical care was necessary to treat or prevent imminent or life-threatening deterioration.  Critical care was time spent personally by me  on the following activities: development of treatment plan with patient and/or surrogate as well as nursing, discussions with consultants, evaluation of patient's response to treatment, examination of patient, obtaining history from patient or surrogate, ordering and performing treatments and interventions, ordering and review of laboratory studies, ordering and review of radiographic studies, pulse oximetry and re-evaluation of patient's condition.   Procedures   MEDICATIONS ORDERED IN ED: Medications  ondansetron (ZOFRAN) 4 MG/2ML injection (has no administration in time range)  insulin regular, human (MYXREDLIN) 100 units/ 100 mL infusion (has no administration in time range)  lactated ringers infusion (has no administration in time range)  dextrose 5 % in lactated ringers infusion (has no administration in time range)  dextrose 50 % solution 0-50 mL (has no administration in time range)  potassium chloride 10 mEq in 100 mL IVPB (has no administration in time  range)  0.9 %  sodium chloride infusion (0 mLs Intravenous Stopped 01/04/23 1035)  ondansetron (ZOFRAN) injection 4 mg (4 mg Intravenous Given 01/04/23 1030)     IMPRESSION / MDM / ASSESSMENT AND PLAN / ED COURSE  I reviewed the triage vital signs and the nursing notes. Patient's presentation is most consistent with acute presentation with potential threat to life or bodily function.   Patient presents with nausea vomiting, weakness in the setting of active CVA.  Differential includes electrolyte abnormalities, viral illness, infection, dehydration  Denies cough or shortness of breath, afebrile, not consistent with pneumonia  Will send respiratory PCR  CBC BMP pending, IV fluids infusing, patient received IV Zofran  EKG is unremarkable, not consistent with ACS, no chest pain  Reviewing labs from prior outpatient visits, gradual decline in sodium, hyponatremia is a possibility, pending BMP  Lab work reviewed, elevated glucose, elevated anion gap, bicarb of 16 most consistent with DKA, patient also has significant elevation of BUN and creatinine consistent with dehydration  He reports that because of his significant weight loss his diabetes medications have been back down significantly, he is only on glipizide 5 mg now  Insulin drip ordered, patient will require admission       FINAL CLINICAL IMPRESSION(S) / ED DIAGNOSES   Final diagnoses:  Diabetic ketoacidosis without coma associated with type 2 diabetes mellitus ([place])     Rx / DC Orders   ED Discharge Orders     None        Note:  This document was prepared using Dragon voice recognition software and may include unintentional dictation errors.   Lavonia Drafts, MD 01/04/23 1141    Lavonia Drafts, MD 01/04/23 1141

## 2023-01-05 ENCOUNTER — Inpatient Hospital Stay
Admit: 2023-01-05 | Discharge: 2023-01-05 | Disposition: A | Discharge status: Home / Self Care | Payer: BLUE CROSS/BLUE SHIELD

## 2023-01-05 ENCOUNTER — Inpatient Hospital Stay: Payer: Medicare Other

## 2023-01-05 DIAGNOSIS — E1110 Type 2 diabetes mellitus with ketoacidosis without coma: Secondary | ICD-10-CM | POA: Diagnosis not present

## 2023-01-05 DIAGNOSIS — E43 Unspecified severe protein-calorie malnutrition: Secondary | ICD-10-CM | POA: Diagnosis present

## 2023-01-05 LAB — GLUCOSE, CAPILLARY
Glucose-Capillary: 143 mg/dL — ABNORMAL HIGH (ref 70–99)
Glucose-Capillary: 144 mg/dL — ABNORMAL HIGH (ref 70–99)
Glucose-Capillary: 145 mg/dL — ABNORMAL HIGH (ref 70–99)
Glucose-Capillary: 152 mg/dL — ABNORMAL HIGH (ref 70–99)
Glucose-Capillary: 165 mg/dL — ABNORMAL HIGH (ref 70–99)
Glucose-Capillary: 194 mg/dL — ABNORMAL HIGH (ref 70–99)
Glucose-Capillary: 196 mg/dL — ABNORMAL HIGH (ref 70–99)
Glucose-Capillary: 225 mg/dL — ABNORMAL HIGH (ref 70–99)

## 2023-01-05 LAB — BASIC METABOLIC PANEL
Anion gap: 9 (ref 5–15)
Anion gap: 7 (ref 5–15)
BUN: 95 mg/dL — ABNORMAL HIGH (ref 8–23)
BUN: 97 mg/dL — ABNORMAL HIGH (ref 8–23)
CO2: 20 mmol/L — ABNORMAL LOW (ref 22–32)
CO2: 19 mmol/L — ABNORMAL LOW (ref 22–32)
Calcium: 10 mg/dL (ref 8.9–10.3)
Calcium: 9.7 mg/dL (ref 8.9–10.3)
Chloride: 99 mmol/L (ref 98–111)
Chloride: 103 mmol/L (ref 98–111)
Creatinine, Ser: 2.58 mg/dL — ABNORMAL HIGH (ref 0.61–1.24)
Creatinine, Ser: 2.49 mg/dL — ABNORMAL HIGH (ref 0.61–1.24)
GFR, Estimated: 27 mL/min — ABNORMAL LOW (ref >60)
GFR, Estimated: 28 mL/min — ABNORMAL LOW (ref >60)
Glucose, Bld: 155 mg/dL — ABNORMAL HIGH (ref 70–99)
Glucose, Bld: 197 mg/dL — ABNORMAL HIGH (ref 70–99)
Potassium: 3.9 mmol/L (ref 3.5–5.1)
Potassium: 4.3 mmol/L (ref 3.5–5.1)
Sodium: 128 mmol/L — ABNORMAL LOW (ref 135–145)
Sodium: 129 mmol/L — ABNORMAL LOW (ref 135–145)

## 2023-01-05 LAB — CBC
HCT: 41 % (ref 39.0–52.0)
Hemoglobin: 14.1 g/dL (ref 13.0–17.0)
MCH: 29.6 pg (ref 26.0–34.0)
MCHC: 34.4 g/dL (ref 30.0–36.0)
MCV: 86.1 fL (ref 80.0–100.0)
Platelets: 234 10*3/uL (ref 150–400)
RBC: 4.76 MIL/uL (ref 4.22–5.81)
RDW: 14.6 % (ref 11.5–15.5)
WBC: 20.1 10*3/uL — ABNORMAL HIGH (ref 4.0–10.5)
nRBC: 0 % (ref 0.0–0.2)

## 2023-01-05 LAB — MAGNESIUM: Magnesium: 2.3 mg/dL (ref 1.7–2.4)

## 2023-01-05 LAB — BETA-HYDROXYBUTYRIC ACID: Beta-Hydroxybutyric Acid: 0.14 mmol/L (ref 0.05–0.27)

## 2023-01-05 MED ORDER — ENSURE ENLIVE PO LIQD
237.0000 mL | Freq: Three times a day (TID) | ORAL | Status: DC
  Administered 2023-01-05: 237 mL via ORAL

## 2023-01-05 MED ORDER — ADULT MULTIVITAMIN W/MINERALS CH
1.0000 | ORAL_TABLET | Freq: Every day | ORAL | Status: DC
  Administered 2023-01-06: 1 via ORAL
  Filled 2023-01-05: qty 1

## 2023-01-05 MED ORDER — INSULIN ASPART 100 UNIT/ML IJ SOLN
4.0000 [IU] | Freq: Three times a day (TID) | INTRAMUSCULAR | Status: DC
  Administered 2023-01-05 – 2023-01-06 (×4): 4 [IU] via SUBCUTANEOUS
  Filled 2023-01-05 (×4): qty 1

## 2023-01-05 MED ORDER — ZINC SULFATE 220 (50 ZN) MG PO CAPS
220.0000 mg | ORAL_CAPSULE | Freq: Every day | ORAL | Status: DC
  Administered 2023-01-06: 220 mg via ORAL
  Filled 2023-01-05 (×2): qty 1

## 2023-01-05 MED ORDER — INSULIN ASPART 100 UNIT/ML IJ SOLN: 0.0000 [IU] | Freq: Every day | INTRAMUSCULAR | Status: DC

## 2023-01-05 MED ORDER — VITAMIN C 500 MG PO TABS
500.0000 mg | ORAL_TABLET | Freq: Two times a day (BID) | ORAL | Status: DC
  Administered 2023-01-05 – 2023-01-06 (×2): 500 mg via ORAL
  Filled 2023-01-05 (×2): qty 1

## 2023-01-05 MED ORDER — INSULIN ASPART 100 UNIT/ML IJ SOLN
0.0000 [IU] | Freq: Three times a day (TID) | INTRAMUSCULAR | Status: DC
  Administered 2023-01-05 (×2): 3 [IU] via SUBCUTANEOUS
  Administered 2023-01-05: 5 [IU] via SUBCUTANEOUS
  Administered 2023-01-05: 2 [IU] via SUBCUTANEOUS
  Administered 2023-01-06: 3 [IU] via SUBCUTANEOUS
  Filled 2023-01-05 (×5): qty 1

## 2023-01-05 MED ORDER — LACTATED RINGERS IV SOLN: INTRAVENOUS | Status: AC

## 2023-01-05 MED ORDER — POTASSIUM CHLORIDE CRYS ER 20 MEQ PO TBCR
40.0000 meq | EXTENDED_RELEASE_TABLET | Freq: Two times a day (BID) | ORAL | Status: AC
  Administered 2023-01-05 (×2): 40 meq via ORAL
  Filled 2023-01-05 (×2): qty 2

## 2023-01-05 MED ORDER — INSULIN ASPART 100 UNIT/ML IJ SOLN: 0.0000 [IU] | Freq: Three times a day (TID) | INTRAMUSCULAR | Status: DC

## 2023-01-05 MED ORDER — INSULIN DETEMIR 100 UNIT/ML ~~LOC~~ SOLN
10.0000 [IU] | Freq: Every day | SUBCUTANEOUS | Status: DC
  Administered 2023-01-05 – 2023-01-06 (×2): 10 [IU] via SUBCUTANEOUS
  Filled 2023-01-05 (×2): qty 0.1

## 2023-01-05 NOTE — Hospital Course (Addendum)
Nicolas Zuniga is a 67 y.o. 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   Patient was recently backed off for some of his diabetes medications with concern of weight loss with recent diagnosis of malignancy and couple of readings of hypoglycemia.  Currently only taking glipizide 5 mg daily and was not checking his blood glucose level regularly.  Developed gradually worsening nausea and vomiting on Saturday, unable to tolerate much p.o. no abdominal pain, no recent illnesses or sick contacts.   Of note patient had a fall few days ago resulted in some laceration of arms which does not look infected.   ED course.  Vital stable with mildly elevated blood pressure at 147/81.  Labs pertinent for leukocytosis at 21.9, pseudohyponatremia secondary to hyperglycemia, blood glucose of 394, bicarb of 16 and anion gap of 20, creatinine 3.79 with BUN of 99 and GFR of 17.  Mildly elevated lipase at 142.  Respiratory panel negative. EKG.  Personally reviewed.  Normal sinus rhythm with left axis deviation, Q waves in inferoanterior leads.  Patient was admitted for DKA and started on Endo tool.  1/30: Patient was transitioned off from insulin infusion and started on basal and short-acting earlier this morning once gap is closed and beta-hydroxybutyrate acid normalized. Continue to have borderline gap metabolic acidosis.  Renal functions improving. Giving some more fluid. Dietitian's concern about severe malnutrition, they talked about placing PEG tube if patient would like to go for chemotherapy in the near future during prior hospitalization.  They approached again for the same recommendations.  Discussed with patient and wife they would like to see how his p.o.  intake goes for the next couple of days before making any decision.  Per wife his p.o. intake improves some and then deteriorated due to persistent nausea and vomiting with DKA.

## 2023-01-05 NOTE — Inpatient Diabetes Management (Signed)
Inpatient Diabetes Program Recommendations  AACE/ADA: New Consensus Statement on Inpatient Glycemic Control   Target Ranges:  Prepandial:   less than 140 mg/dL      Peak postprandial:   less than 180 mg/dL (1-2 hours)      Critically ill patients:  140 - 180 mg/dL    Latest Reference Range & Units 01/05/23 00:31 01/05/23 01:41 01/05/23 02:45 01/05/23 04:21 01/05/23 07:42 01/05/23 11:18  Glucose-Capillary 70 - 99 mg/dL 152 (H) 143 (H) 165 (H) 145 (H) 196 (H) 225 (H)    Latest Reference Range & Units 01/04/23 15:35 01/04/23 16:32 01/04/23 17:31 01/04/23 18:29 01/04/23 19:36 01/04/23 20:38 01/04/23 22:29 01/04/23 23:29  Glucose-Capillary 70 - 99 mg/dL 201 (H) 190 (H) 181 (H) 177 (H) 172 (H) 173 (H) 133 (H) 142 (H)    Latest Reference Range & Units 01/04/23 09:37  Hemoglobin A1C 4.8 - 5.6 % 6.9 (H)    Latest Reference Range & Units 01/04/23 09:37  CO2 22 - 32 mmol/L 16 (L)  Glucose 70 - 99 mg/dL 394 (H)  Anion gap 5 - 15  20 (H)    Latest Reference Range & Units 01/04/23 11:37  Beta-Hydroxybutyric Acid 0.05 - 0.27 mmol/L 1.95 (H)   Review of Glycemic Control  Diabetes history: DM2 Outpatient Diabetes medications: Glipizide XL 5 mg daily Current orders for Inpatient glycemic control: Levemir 10 units daily, Novolog 0-15 units TID with meals, Novolog 0-5 units QHS, Novolog 4 units TID with meals   NOTE: Patient presented to the ED with N/V and abdominal pain for 2 days with initial lab glucose of 394 mg/dl. Per chart review, noted patient recently dx with neuroendocrine colon cancer with metastasis. Noted patient seen his PCP on 11/23/22 and per note patient was having hypoglycemia and was asked to decrease Glipizide XL to 5 mg daily.  Spoke with patient and wife at bedside. Patient confirms that his PCP recently decreased the Glipizide to 5 mg daily which is what he was taking. Patient's wife states that over the past several days patient was not eating well and was having N/V. Patient or  his wife check glucose 1-2 times a day and it had been in the low 100's and wife notes it was 248 mg/dl this past Thursday. Patient reports that since he has had colectomy and colostomy, he has noticed that some of the pills he takes orally come out in colostomy whole. He is not sure if Glipizide is one that is not being absorbed or not. Patient reports he has taken insulin in the past and notes that he was gaining a lot of weight when he was taking the insulin. Patient reports that he has lost from 280 lbs (prior to colectomy and colostomy in October 2023) down to 230 lbs (weight at PCP on 12/28/22). Patient states that it has been discussed that he may need a PEG tube for tube feeds but he is unsure if he would like to have PEG tube or not at this time. Discussed that if tube feeds are started, that they will likely increase glucose as well. Patient's wife stated that they would like to see if they can get a CGM for patient so they can watch his glucose closer. Informed patient and wife that our team has some samples of Free[employer] Libre3 CGM sensors so I would ask attending if they would give permission to give patient sample of FreeStyle Libre 3 CGM sensors prior to discharge. Discussed how FreeStyle Libre 3 works and patient  notes he has a smart phone and would prefer to use an app with it if given samples. Informed patient of current A1C of 6.9% on 01/04/23. Explained current insulin orders and that glucose trends will be followed to help determine plan for outpatient DM control. Patient verbalized understanding of information and has no questions at this time.  Thanks, Barnie Alderman, RN, MSN, [place] [facility] Coordinator Inpatient Diabetes Program [phone] (Team Pager from 8am to [place])

## 2023-01-05 NOTE — Progress Notes (Signed)
CROSS COVER NOTE  NAME: Nicolas Zuniga MRN: [id number] DOB : 08-03-1956 ATTENDING PHYSICIAN: Lorella Nimrod, MD   Notified by nursing they received ENDOTOOL alert that patient is to be transitioned off of insulin infusion.  Chart reviewed.  Gap is closed and beta-hydroxybutyrate level normal.    Patient not on scheduled basal insulin at home - patient most recently on 10U of Levemir daily during November 2023 admission.  4 units Novolog ordered AC.  Carb Modified diet started.  Insulin infusion to be stopped 2 hrs after administration of basal insulin administration.     Initiating accuchecks QAC and QHS with sliding scale insulin.   To reach the provider On-Call:   7AM- 7PM see care teams to locate the attending and reach out to them via [email or web address] 7PM-7AM contact night-coverage If you still have difficulty reaching the appropriate provider, please page the [facility] (Director on Call) for [facility] on amion for assistance  This document was prepared using Systems analyst and may include unintentional dictation errors.  Neomia Glass DNP, MBA, FNP-BC, PMHNP-BC Nurse Practitioner [facility] Pager [phone]

## 2023-01-05 NOTE — Progress Notes (Signed)
[place] for Electrolyte Monitoring and Replacement   Recent Labs: Potassium (mmol/L)  Date Value  01/05/2023 3.9  04/05/2014 4.4   Magnesium (mg/dL)  Date Value  01/05/2023 2.3  06/19/2012 1.8   Calcium (mg/dL)  Date Value  01/05/2023 10.0   Calcium, Total (mg/dL)  Date Value  04/05/2014 9.7   Albumin (g/dL)  Date Value  01/04/2023 3.7  04/05/2014 4.0   Phosphorus (mg/dL)  Date Value  10/19/2022 3.0  06/16/2012 2.7   Sodium (mmol/L)  Date Value  01/05/2023 128 (L)  04/05/2014 136     Assessment: 67 year old male admitted with DKA. 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   Transitioned off insulin infusion 1/29 PM. Now on [place] insulin.  Kcl 10 mEq x 2 runs given '@1115'$ . Continues on LR@ 100 mL/hr.  Goal of Therapy:  K+ > 4 Mag > 2 All other electrolytes within normal limits  Plan:  K 3.9. Give Kcl PO 40 mEq x 2 doses Na 129 - continues on LR at 100 mL/hr Follow up BMP and [place], PharmD, BCPS Clinical Pharmacist  01/05/2023 7:29 AM

## 2023-01-05 NOTE — Progress Notes (Signed)
Progress Note   Patient: Nicolas Zuniga HGD:[id number] DOB: 02-Nov-1956 DOA: 01/04/2023     1 DOS: the patient was seen and examined on 01/05/2023   Brief hospital course: SOHUM DELILLO is a 67 y.o. 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   Patient was recently backed off for some of his diabetes medications with concern of weight loss with recent diagnosis of malignancy and couple of readings of hypoglycemia.  Currently only taking glipizide 5 mg daily and was not checking his blood glucose level regularly.  Developed gradually worsening nausea and vomiting on Saturday, unable to tolerate much p.o. no abdominal pain, no recent illnesses or sick contacts.   Of note patient had a fall few days ago resulted in some laceration of arms which does not look infected.   ED course.  Vital stable with mildly elevated blood pressure at 147/81.  Labs pertinent for leukocytosis at 21.9, pseudohyponatremia secondary to hyperglycemia, blood glucose of 394, bicarb of 16 and anion gap of 20, creatinine 3.79 with BUN of 99 and GFR of 17.  Mildly elevated lipase at 142.  Respiratory panel negative. EKG.  Personally reviewed.  Normal sinus rhythm with left axis deviation, Q waves in inferoanterior leads.  Patient was admitted for DKA and started on Endo tool.  1/30: Patient was transitioned off from insulin infusion and started on basal and short-acting earlier this morning once gap is closed and beta-hydroxybutyrate acid normalized. Continue to have borderline gap metabolic acidosis.  Renal functions improving. Giving some more fluid. Dietitian's concern about severe malnutrition, they talked about placing PEG tube if patient would like to go for chemotherapy  in the near future during prior hospitalization.  They approached again for the same recommendations.  Discussed with patient and wife they would like to see how his p.o. intake goes for the next couple of days before making any decision.  Per wife his p.o. intake improves some and then deteriorated due to persistent nausea and vomiting with DKA.   Assessment and Plan: * DKA (diabetic ketoacidosis) ([place]) History of type 2 diabetes mellitus: DKA resolved.  Patient is now on basal and short-acting Patient was recently backed off from some of his diabetes medications due to weight loss.  Currently was just taking glipizide and dose was also decreased to 5 mg daily. Not checking his blood glucose level regularly. Initial labs consistent with DKA, VBG with mild metabolic acidosis.  Initially treated with Endo tool protocol Continue to have some metabolic acidosis. -Giving some more fluid -Continue to monitor -Continue with basal and SSI  AKI (acute kidney injury) ([place]) Per chart review patient with gradual worsening of renal function for the past 1 month.  Up till November 2023 his creatinine was less than 1.  Creatinine on admission at 3.79 Renal ultrasound with no hydronephrosis.  Renal functions improving with IV fluid, most likely prerenal with dehydration secondary to DKA. -Monitor renal function -Avoid nephrotoxins  Paroxysmal A-fib (H[facility]) No acute concern. -Continue home flecainide, metoprolol and Eliquis  Essential hypertension -Continue home metoprolol -As needed hydralazine  Neuroendocrine carcinoma of colon ([place]) Patient with history of neuroendocrine carcinoma of colon s/p surgery and diversion colostomy, planning to start palliative chemotherapy next month. -Colostomy care -Continue with outpatient management and follow-up  Protein-calorie  malnutrition, severe Patient with recent significant weight loss, recently diagnosed with neuroendocrine carcinoma of colon with mets.   Poor p.o. intake.  He was advised to get a PEG tube during prior hospitalization to improve nutritional status for upcoming cancer management. Discussed again with patient and wife and they would like to give him a few more days to see if he start taking more p.o. after resolution of this DKA.  -Dietitian is on board  Leukocytosis Most likely reactive with DKA.  No other obvious infection.  Some improvement but all cell lines decreased.  UA is not impressive for infection -Continue to monitor        Subjective: Patient was seen and examined today.  No more nausea or vomiting.  Started taking some p.o. but appetite remained poor.  Physical Exam: Vitals:   01/05/23 0500 01/05/23 0600 01/05/23 0700 01/05/23 1000  BP: 109/62 102/61 107/64 120/66  Pulse: (!) 54 (!) 53 (!) 56 (!) 54  Resp: '16 15 17 18  '$ Temp:    98.6 F (37 C)  TempSrc:    Oral  SpO2: 100% 100% 100% 100%  Weight:      Height:       General.  Chronically ill-appearing gentleman in no acute distress. Pulmonary.  Lungs clear bilaterally, normal respiratory effort. CV.  Regular rate and rhythm, no JVD, rub or murmur. Abdomen.  Soft, nontender, nondistended, BS positive. CNS.  Alert and oriented .  No focal neurologic deficit. Extremities.  No edema, no cyanosis, pulses intact and symmetrical. Psychiatry.  Judgment and insight appears normal.   Data Reviewed: Prior data reviewed  Family Communication: Discussed with wife at bedside  Disposition: Status is: Inpatient Remains inpatient appropriate because: Severity of illness  Planned Discharge Destination: Home  DVT prophylaxis.  Eliquis Time spent: 50 minutes  This record has been created using Systems analyst. Errors have been sought and corrected,but may not always be located. Such creation errors do not reflect on the standard of care.   Author: Lorella Nimrod, MD 01/05/2023 12:45 PM  For on call review [email or web address].

## 2023-01-05 NOTE — Assessment & Plan Note (Signed)
Patient with recent significant weight loss, recently diagnosed with neuroendocrine carcinoma of colon with mets.  Poor p.o. intake.  He was advised to get a PEG tube during prior hospitalization to improve nutritional status for upcoming cancer management. Discussed again with patient and wife and they would like to give him a few more days to see if he start taking more p.o. after resolution of this DKA.  -Dietitian is on board

## 2023-01-05 NOTE — Progress Notes (Signed)
Initial Nutrition Assessment  DOCUMENTATION CODES:   Severe malnutrition in context of chronic illness  INTERVENTION:   Recommend G-tube placement and nutrition support if goals are for full care  Ensure Enlive po TID, each supplement provides 350 kcal and 20 grams of protein.  MVI po daily   Vitamin C '500mg'$  po BID  Zinc '220mg'$  po daily x 30 days  Pt at high refeed risk; recommend monitor potassium, magnesium and phosphorus labs daily until stable  Daily weights   Check vitamin B12 level  NUTRITION DIAGNOSIS:   Severe Malnutrition related to cancer and cancer related treatments as evidenced by moderate fat depletion, moderate muscle depletion, 19 percent weight loss in 2 months.  GOAL:   Patient will meet greater than or equal to 90% of their needs  MONITOR:   PO intake, Supplement acceptance, Labs, Weight trends, I & O's, Skin  REASON FOR ASSESSMENT:   Malnutrition Screening Tool    ASSESSMENT:   67 y/o male with h/o DM, HTN, Afib s/p cardioversion, anxiety, HLD, hiatal hernia, cholelithiasis and colon cancer (adenocarcinoma) s/p open right colectomy with ileocolic anastomosis and cholecystectomy 15/72 complicated by anastamotic leak s/p exploratory laparotomy and abdominal washout and diverting loop ileostomy 10/23 (with removal of 38cm terminal ileum: pathology results: neuroendocrine tumor w extension to the nodes and peritoneum) and who is admitted with DKA.  Met with pt in room today. Pt is well known to this RD from his recent previous admission. Pt reports fair appetite and oral intake at baseline but reports his oral intake has been poor over the past few days r/t nausea and vomiting. Pt reports that he is feeling less nauseas today. Pt reports eating a banana and some milk for breakfast. Pt reports that he has been drinking some Ensure at home (any flavor). Pt with poor appetite and oral intake during his previous admission. Pt was recommended for G-tube  placement in November but tube was held as pt started to eat a little better. Per chart, pt is down 52lbs(19%) since November; this is severe weight loss. If goal is for full care, recommend G-tube placement and nutrition support; this was discussed with MD and with patient. RD will add supplements to help pt meet his estimated needs and to support wound healing. Pt is at high refeed risk. Pt with removal of his terminal ileum; RD will check B12 level.   Medications reviewed and include: D3, insulin, MVI, KCl, B12, LRS '@100ml'$ /hr   Labs reviewed: Na 129(L), K 4.3 wnl, BUN 97(H), creat 2.49(H), Mg 2.3 wnl Wbc- 20.1(H) Cbgs- 225, 196, 145, 165, 143, 152 x 24 hrs AIC 6.9(H)- 1/29  NUTRITION - FOCUSED PHYSICAL EXAM:  Flowsheet Row Most Recent Value  Orbital Region Moderate depletion  Upper Arm Region Moderate depletion  Thoracic and Lumbar Region Moderate depletion  Buccal Region Mild depletion  Temple Region Mild depletion  Clavicle Bone Region Moderate depletion  Clavicle and Acromion Bone Region Moderate depletion  Scapular Bone Region Mild depletion  Dorsal Hand No depletion  Patellar Region Mild depletion  Anterior Thigh Region Mild depletion  Posterior Calf Region Mild depletion  Edema (RD Assessment) Mild  Hair Reviewed  Eyes Reviewed  Mouth Reviewed  Skin Reviewed  Nails Reviewed   Diet Order:   Diet Order             Diet Carb Modified Fluid consistency: Thin; Room service appropriate? Yes  Diet effective now  EDUCATION NEEDS:   Education needs have been addressed  Skin:  Skin Assessment: Reviewed RN Assessment (abdominal wound)  Last BM:  1/30- 162m via ostomy  Height:   Ht Readings from Last 1 Encounters:  01/04/23 '6\' 2"'$  (1.88 m)    Weight:   Wt Readings from Last 1 Encounters:  01/04/23 101.3 kg    Ideal Body Weight:  86.3 kg  BMI:  Body mass index is 28.68 kg/m.  Estimated Nutritional Needs:   Kcal:   2400-2700kcal/day  Protein:  120-135g/day  Fluid:  2.3-2.6L/day  CKoleen DistanceMS, RD, LDN Please refer to [facility]for RD and/or RD on-call/weekend/after hours pager

## 2023-01-06 DIAGNOSIS — E1110 Type 2 diabetes mellitus with ketoacidosis without coma: Secondary | ICD-10-CM | POA: Diagnosis not present

## 2023-01-06 LAB — CBC
HCT: 39.6 % (ref 39.0–52.0)
Hemoglobin: 13.3 g/dL (ref 13.0–17.0)
MCH: 29.7 pg (ref 26.0–34.0)
MCHC: 33.6 g/dL (ref 30.0–36.0)
MCV: 88.4 fL (ref 80.0–100.0)
Platelets: 211 10*3/uL (ref 150–400)
RBC: 4.48 MIL/uL (ref 4.22–5.81)
RDW: 14.6 % (ref 11.5–15.5)
WBC: 15.1 10*3/uL — ABNORMAL HIGH (ref 4.0–10.5)
nRBC: 0 % (ref 0.0–0.2)

## 2023-01-06 LAB — BASIC METABOLIC PANEL
Anion gap: 12 (ref 5–15)
BUN: 75 mg/dL — ABNORMAL HIGH (ref 8–23)
CO2: 15 mmol/L — ABNORMAL LOW (ref 22–32)
Calcium: 9.9 mg/dL (ref 8.9–10.3)
Chloride: 102 mmol/L (ref 98–111)
Creatinine, Ser: 1.68 mg/dL — ABNORMAL HIGH (ref 0.61–1.24)
GFR, Estimated: 45 mL/min — ABNORMAL LOW (ref >60)
Glucose, Bld: 157 mg/dL — ABNORMAL HIGH (ref 70–99)
Potassium: 4.5 mmol/L (ref 3.5–5.1)
Sodium: 129 mmol/L — ABNORMAL LOW (ref 135–145)

## 2023-01-06 LAB — VITAMIN B12: Vitamin B-12: 519 pg/mL (ref 180–914)

## 2023-01-06 LAB — GLUCOSE, CAPILLARY: Glucose-Capillary: 153 mg/dL — ABNORMAL HIGH (ref 70–99)

## 2023-01-06 LAB — PHOSPHORUS: Phosphorus: 3 mg/dL (ref 2.5–4.6)

## 2023-01-06 LAB — MAGNESIUM: Magnesium: 2.2 mg/dL (ref 1.7–2.4)

## 2023-01-06 MED ORDER — INSULIN GLARGINE 100 UNIT/ML SOLOSTAR PEN: 5.0000 [IU] | PEN_INJECTOR | Freq: Every day | SUBCUTANEOUS | 11 refills | Status: DC

## 2023-01-06 MED ORDER — SODIUM BICARBONATE 650 MG PO TABS: 650.0000 mg | ORAL_TABLET | Freq: Two times a day (BID) | ORAL | 1 refills | Status: DC

## 2023-01-06 MED ORDER — "PEN NEEDLES 1/2"" 29G X 12MM MISC": 1.0000 | Freq: Every day | 1 refills | Status: DC

## 2023-01-06 NOTE — Plan of Care (Signed)
  Problem: Health Behavior/Discharge Planning: Goal: Ability to manage health-related needs will improve Outcome: Progressing   Problem: Clinical Measurements: Goal: Will remain free from infection Outcome: Progressing   Problem: Clinical Measurements: Goal: Respiratory complications will improve Outcome: Progressing   Problem: Clinical Measurements: Goal: Cardiovascular complication will be avoided Outcome: Progressing   Problem: Elimination: Goal: Will not experience complications related to bowel motility Outcome: Progressing   Problem: Pain Managment: Goal: General experience of comfort will improve Outcome: Progressing   Problem: Safety: Goal: Ability to remain free from injury will improve Outcome: Progressing

## 2023-01-06 NOTE — TOC Initial Note (Signed)
Transition of Care [facility]) - Initial/Assessment Note    Patient Details  Name: Nicolas Zuniga MRN: [id number] Date of Birth: 1956-03-05  Transition of Care [facility]) CM/SW Contact:    Beverly Sessions, RN Phone Number: 01/06/2023, 10:37 AM  Clinical Narrative:                   Transition of Care [facility]) Screening Note   Patient Details  Name: Nicolas Zuniga Date of Birth: [birth year]   Transition of [facility]) CM/SW Contact:    Beverly Sessions, RN Phone Number: 01/06/2023, 10:37 AM    Transition of Care Department [facility]) has reviewed patient and no TOC needs have been identified at this time. We will continue to monitor patient advancement through interdisciplinary progression rounds. If new patient transition needs arise, please place a TOC consult.  Patient with existing colostomy. Malnutrition, dietician has discussed the recommendation  of PEG tube.  It has not been decided if patient would like to proceed. If Peg tube placed please consult toc for supplies       Patient Goals and CMS Choice            Expected Discharge Plan and Services                                              Prior Living Arrangements/Services                       Activities of Daily Living Home Assistive Devices/Equipment: None ADL Screening (condition at time of admission) Patient's cognitive ability adequate to safely complete daily activities?: Yes Is the patient deaf or have difficulty hearing?: No Does the patient have difficulty seeing, even when wearing glasses/contacts?: No Does the patient have difficulty concentrating, remembering, or making decisions?: No Patient able to express need for assistance with ADLs?: Yes Does the patient have difficulty dressing or bathing?: No Independently performs ADLs?: Yes (appropriate for developmental age) Does the patient have difficulty walking or climbing stairs?: No Weakness of Legs: None Weakness of Arms/Hands:  None  Permission Sought/Granted                  Emotional Assessment              Admission diagnosis:  DKA (diabetic ketoacidosis) ([place]) [E11.10] Diabetic ketoacidosis without coma associated with type 2 diabetes mellitus ([place]) [E11.10] Patient Active Problem List   Diagnosis Date Noted   Protein-calorie malnutrition, severe 01/05/2023   DKA (diabetic ketoacidosis) ([place]) 01/04/2023   Leukocytosis 01/04/2023   Neuroendocrine carcinoma of colon ([place]) 11/14/2022   Irritant contact dermatitis associated with fecal stoma 11/03/2022   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/03/2022   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[place]) 11/03/2022   Intra-abdominal abscess ([place])    Acute hypoxic respiratory failure ([place]) 10/05/2022   Weakness 10/05/2022   Microcytic anemia 10/05/2022   Generalized anxiety disorder 10/04/2022   Diabetic ketoacidosis without coma associated with type 2 diabetes mellitus ([place])    Postoperative intra-abdominal abscess 09/28/2022   Persistent atrial fibrillation ([place])    Septic shock ([place])    Hyponatremia 09/25/2022   AKI (acute kidney injury) ([place]) 09/25/2022   S/P right colectomy 09/24/2022   S/P partial resection of colon 09/24/2022   Change in bowel habits    Colonic mass    Polyp of transverse colon  Diabetes mellitus type 2, uncomplicated ([place]) 94/32/7614   Kidney stones 04/30/2020   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   Bradycardia 12/30/2018   Paroxysmal A-fib ([place]) 12/14/2018   Non compliance with medical treatment 04/13/2018   History of atrial fibrillation 07/04/2012   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   Diabetes ([address]) 03/08/2007   HYPERLIPIDEMIA 03/08/2007   ANXIETY 03/08/2007   Essential hypertension 03/08/2007   HX, PERSONAL, URINARY CALCULI 03/08/2007   PCP:  Hortencia Pilar, MD Pharmacy:   [facility] [phone] - [address], [address] AT [facility] OF [address] [place] [address] Phone:  [phone] Fax: [phone]     Social Determinants of Health ([place]) Social History: [place]: No Food Insecurity (01/04/2023)  Housing: Low Risk  (01/04/2023)  Transportation Needs: No Transportation Needs (01/04/2023)  Utilities: Not At Risk (01/04/2023)  Tobacco Use: Low Risk  (01/04/2023)   SDOH Interventions:     Readmission Risk Interventions    09/30/2022    5:08 PM  Readmission Risk Prevention Plan  Transportation Screening Complete  PCP or Specialist Appt within 3-5 Days Complete  HRI or [place] Complete  Social Work Consult for [place] Planning/Counseling Complete  Palliative Care Screening Not Applicable  Medication Review Press photographer) Complete

## 2023-01-06 NOTE — Discharge Instructions (Signed)
Goal fasting sugar is 140-180

## 2023-01-06 NOTE — Discharge Summary (Signed)
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   Patient was recently backed off for some of his diabetes medications with concern of weight loss with recent diagnosis of malignancy and couple of readings of hypoglycemia.  Currently only taking glipizide 5 mg daily and was not checking his blood glucose level regularly.  Developed gradually worsening nausea and vomiting on Saturday, unable to tolerate much p.o. no abdominal pain, no recent illnesses or sick contacts.  Denies any chest pain, shortness of breath or upper respiratory symptoms.  No diarrhea or change in colostomy secretions.  No urinary symptoms.  Hospital Course:  Patient presented with nausea and vomiting. This in setting of reduced PO at home, recent changes in home diabetes meds, report that home glipizide seems to pass through ostomy sometimes. Recently diagnosed with colon cancer, has diverting ostomy, outpt plan for port placement  later this month then start palliative chemotherapy. Here found to be in DKA treated with IV insulin and fluids. Also with AKI pre-renal from the above processes. DKA resolved though remains with low bicarb, think ostomy output likely contributing. Vomiting resolved and tolerating fluids and some food. Ambulated to his satisfaction, declines home health. Kidney function much improved, today cr 1.6 from baseline of around 1, was 3.79 on arrival. No infectious etiology identified, think dka likely 2/2 recent med changes, poor absorption of oral glipizide. Offered patient another day of IV fluids but as he is tolerating PO and feeling well and requesting discharge today I agreed. Will d/c on low dose long-acting insulin with instructions to titrate based on fasting sugars, goal for now would be something in the range of 140-180. Advising close pcp f/u to assess glycemic mgmt, bicarb, and kidney function. I did discharge on sodium bicarb tablets. Will also need outpt f/u with gen surg for port placement and oncology as scheduled. Patient does report lately a sensation of food not going down like it normally does. No overt dysphagia or odynophagia. Previously followed by Dr. Allen Norris of GI, I advised contacting that office for evaluation.  Procedures: none   Consultations: none  Discharge Exam: Vitals:   01/06/23 0406 01/06/23 0803  BP: 117/75 128/64  Pulse: (!) 50 (!) 51  Resp: 16 16  Temp: 97.8 F (36.6 C) 97.7 F (36.5 C)  SpO2: 100% 100%    General: NAD Cardiovascular: RRR Respiratory: CTAB Abdomen: soft  Discharge Instructions   Discharge Instructions     Diet - low sodium heart healthy   Complete by: As directed  Increase activity slowly   Complete by: As directed    No wound care   Complete by: As directed       Allergies as of 01/06/2023   No Known Allergies      Medication List     STOP taking these medications    glipiZIDE 5 MG 24 hr tablet Commonly known as:  GLUCOTROL XL       TAKE these medications    apixaban 5 MG Tabs tablet Commonly known as: ELIQUIS Take 5 mg by mouth 2 (two) times daily.   CoQ10 100 MG Caps Take 100 mg by mouth daily.   Fish Oil 1000 MG Caps Take 1,000 mg by mouth every other day.   flecainide 150 MG tablet Commonly known as: TAMBOCOR Take 1 tablet by mouth 2 (two) times daily.   insulin glargine 100 UNIT/ML Solostar Pen Commonly known as: LANTUS Inject 5 Units into the skin at bedtime.   metoprolol tartrate 100 MG tablet Commonly known as: LOPRESSOR Take 100 mg by mouth 2 (two) times daily.   multivitamin with minerals tablet Take 1 tablet by mouth every other day.   PEN NEEDLES 29GX1/2" 29G X 12MM Misc 1 each by Does not apply route daily in the afternoon.   sodium bicarbonate 650 MG tablet Take 1 tablet (650 mg total) by mouth 2 (two) times daily.   Turmeric 400 MG Caps Take 400 mg by mouth every other day.   vitamin B-12 100 MCG tablet Commonly known as: CYANOCOBALAMIN Take 500 mcg by mouth every other day.   Vitamin D 50 MCG (2000 UT) Caps Take 2,000 Units by mouth every other day.   Vitamin E 400 units Tabs Take 400 Units by mouth every other day.       No Known Allergies  Follow-up Information     Hortencia Pilar, MD Follow up.   Specialty: Family Medicine Contact information: [place] [address] [phone]         Sindy Guadeloupe, MD Follow up.   Specialty: Oncology Contact information: [place] [address] [phone]         Lucilla Lame, MD Follow up.   Specialty: Gastroenterology Contact information: [place]  [place] [address] [phone]                  The results of significant diagnostics from this hospitalization (including imaging, microbiology, ancillary and laboratory) are listed below for reference.    Significant Diagnostic Studies: US RENAL  Result Date: 01/04/2023 CLINICAL  DATA:  Acute renal failure EXAM: RENAL / URINARY TRACT ULTRASOUND COMPLETE COMPARISON:  CT abdomen pelvis 10/14/2022 FINDINGS: Right Kidney: Renal measurements: 11.2 x 6.2 x 5.5 cm = volume: 199.1 mL. Echogenicity within normal limits. No mass or hydronephrosis visualized. Left Kidney: Renal measurements: 11.4 x 5.0 x 5.5 cm = volume: 165.6 mL. Echogenicity within normal limits. No mass or hydronephrosis visualized. Note is made of a 1.9 cm cyst. No imaging follow-up needed. Bladder: Appears normal for degree of bladder distention. Other: None. IMPRESSION: No hydronephrosis. Electronically Signed   By: Lovey Newcomer M.D.   On: 01/04/2023 15:08   NM PET Image Initial (PI) Skull Base To Thigh  Result Date: 12/22/2022 CLINICAL DATA:  Initial treatment strategy for colon cancer. EXAM: NUCLEAR MEDICINE PET SKULL BASE TO THIGH TECHNIQUE: 12.95 mCi F-18 FDG was injected intravenously. Full-ring PET imaging was performed from the skull base to thigh after the radiotracer. CT  data was obtained and used for attenuation correction and anatomic localization. Fasting blood glucose: 140 mg/dl COMPARISON:  CT AP 10/14/2022 FINDINGS: Mediastinal blood pool activity: SUV max 2.81 Liver activity: SUV max NA NECK: No hypermetabolic lymph nodes in the neck. Incidental CT findings: None. CHEST: No hypermetabolic mediastinal or hilar nodes. No suspicious pulmonary nodules on the CT scan. Too small to characterize by PET-CT nodule in the inferior right middle lobe measures 3 mm, image 122/2. Incidental CT findings: Moderate right pleural effusion. Aortic atherosclerosis and coronary artery calcifications. ABDOMEN/PELVIS: There are at least 5 foci of abnormal increased radiotracer uptake involving the right lobe of liver compatible with liver metastases. -Index lesion within segment 7 measures 2.5 by 2.3 cm with SUV max of 14.73, image 130/2. -index lesion within segment 5 measures 2.7 x 2.6 cm with SUV max of 18.03, image 142/2. Soft  tissue nodule within the small bowel mesentery measures 0.9 cm with SUV max of 12.17, image 165/2. Irregular, rounded structure within the right lower quadrant of the abdomen has a peripheral area of tracer avid soft tissue nodularity with SUV max of 3.03, image 211/2. This is nonspecific and may represent residual postoperative change. Nodular thickening along the left pericolic gutter adjacent to the descending colon is noted with mild increased uptake. SUV max is equal to 2.2, image 106/2. No abnormal uptake within the pancreas, spleen or adrenal glands. No tracer avid retroperitoneal or pelvic lymph nodes. Incidental CT findings: Status post right hemicolectomy with loop ileostomy Hartmann's pouch is identified which terminates at the level of the proximal transverse colon. Aortic atherosclerosis. Small focus of loculated gas is identified adjacent to the suture chain of the Hartmann's pouch measuring approximately 9 mm, image 151/2. SKELETON: No focal hypermetabolic activity to suggest skeletal metastasis. Incidental CT findings: None. IMPRESSION: 1. Status post right hemicolectomy with loop ileostomy and Hartmann's pouch. There is a small focus of loculated gas adjacent to the suture chain of the Hartmann's pouch which is nonspecific in the postoperative setting. 2. Multifocal FDG avid liver metastases. 3. FDG avid soft tissue nodule within the small bowel mesentery compatible with metastatic adenopathy versus peritoneal disease could. 4. Nodular thickening along the left pericolic gutter adjacent to the descending colon is noted with mild increased radiotracer uptake. This is nonspecific and may reflect postsurgical change versus early peritoneal disease. 5. Moderate right pleural effusion. 6. 3 mm right middle lobe lung nodule, too small to characterize by PET-CT. 7.  Aortic Atherosclerosis (ICD10-I70.0). Electronically Signed   By: Kerby Moors M.D.   On: 12/22/2022 12:33    Microbiology: Recent  Results (from the past 240 hour(s))  Resp panel by RT-PCR (RSV, Flu A&B, Covid) Anterior Nasal Swab     Status: None   Collection Time: 01/04/23 10:35 AM   Specimen: Anterior Nasal Swab  Result Value Ref Range Status   SARS Coronavirus 2 by RT PCR NEGATIVE NEGATIVE Final    Comment: (NOTE) SARS-CoV-2 target nucleic acids are NOT DETECTED.  The SARS-CoV-2 RNA is generally detectable in upper respiratory specimens during the acute phase of infection. The lowest concentration of SARS-CoV-2 viral copies this assay can detect is 138 copies/mL. A negative result does not preclude SARS-Cov-2 infection and should not be used as the sole basis for treatment or other patient management decisions. A negative result may occur with  improper specimen collection/handling, submission of specimen other than nasopharyngeal swab, presence of viral mutation(s) within the areas targeted by this assay, and inadequate number of viral  copies(<138 copies/mL). A negative result must be combined with clinical observations, patient history, and epidemiological information. The expected result is Negative.  Fact Sheet for Patients:  EntrepreneurPulse.com.au  Fact Sheet for Healthcare Providers:  IncredibleEmployment.be  This test is no t yet approved or cleared by the [place] FDA and  has been authorized for detection and/or diagnosis of SARS-CoV-2 by FDA under an Emergency Use Authorization (EUA). This EUA will remain  in effect (meaning this test can be used) for the duration of the COVID-19 declaration under Section 564(b)(1) of the Act, 21 U.S.C.section 360bbb-3(b)(1), unless the authorization is terminated  or revoked sooner.       Influenza A by PCR NEGATIVE NEGATIVE Final   Influenza B by PCR NEGATIVE NEGATIVE Final    Comment: (NOTE) The Xpert Xpress SARS-CoV-2/FLU/RSV plus assay is intended as an aid in the diagnosis of influenza from Nasopharyngeal  swab specimens and should not be used as a sole basis for treatment. Nasal washings and aspirates are unacceptable for Xpert Xpress SARS-CoV-2/FLU/RSV testing.  Fact Sheet for Patients: EntrepreneurPulse.com.au  Fact Sheet for Healthcare Providers: IncredibleEmployment.be  This test is not yet approved or cleared by the [place] FDA and has been authorized for detection and/or diagnosis of SARS-CoV-2 by FDA under an Emergency Use Authorization (EUA). This EUA will remain in effect (meaning this test can be used) for the duration of the COVID-19 declaration under Section 564(b)(1) of the Act, 21 U.S.C. section 360bbb-3(b)(1), unless the authorization is terminated or revoked.     Resp Syncytial Virus by PCR NEGATIVE NEGATIVE Final    Comment: (NOTE) Fact Sheet for Patients: EntrepreneurPulse.com.au  Fact Sheet for Healthcare Providers: IncredibleEmployment.be  This test is not yet approved or cleared by the [place] FDA and has been authorized for detection and/or diagnosis of SARS-CoV-2 by FDA under an Emergency Use Authorization (EUA). This EUA will remain in effect (meaning this test can be used) for the duration of the COVID-19 declaration under Section 564(b)(1) of the Act, 21 U.S.C. section 360bbb-3(b)(1), unless the authorization is terminated or revoked.  Performed at [facility], [address] [place], [place] [address]   MRSA Next Gen by PCR, Nasal     Status: None   Collection Time: 01/04/23  1:24 PM   Specimen: Nasal Mucosa; Nasal Swab  Result Value Ref Range Status   MRSA by PCR Next Gen NOT DETECTED NOT DETECTED Final    Comment: (NOTE) The GeneXpert MRSA Assay (FDA approved for NASAL specimens only), is one component of a comprehensive MRSA colonization surveillance program. It is not intended to diagnose MRSA infection nor to guide or monitor treatment for MRSA  infections. Test performance is not FDA approved in patients less than 40 years old. Performed at [facility], [address] [place], [place] [address]      Labs: Basic Metabolic Panel: Recent Labs  Lab 01/04/23 1903 01/04/23 2159 01/05/23 0313 01/05/23 1025 01/06/23 0431  NA 126* 132* 128* 129* 129*  K 4.1 4.3 3.9 4.3 4.5  CL 97* 101 99 103 102  CO2 15* 19* 20* 19* 15*  GLUCOSE 178* 149* 155* 197* 157*  BUN 98* 98* 95* 97* 75*  CREATININE 2.92* 2.97* 2.58* 2.49* 1.68*  CALCIUM 10.4* 10.4* 10.0 9.7 9.9  MG  --   --  2.3  --  2.2  PHOS  --   --   --   --  3.0   Liver Function Tests: Recent Labs  Lab  01/04/23 0937  AST 31  ALT 26  ALKPHOS 114  BILITOT 0.6  PROT 9.9*  ALBUMIN 3.7   Recent Labs  Lab 01/04/23 0937  LIPASE 142*   No results for input(s): "AMMONIA" in the last 168 hours. CBC: Recent Labs  Lab 01/04/23 0937 01/05/23 0313 01/06/23 0431  WBC 21.9* 20.1* 15.1*  HGB 15.2 14.1 13.3  HCT 44.8 41.0 39.6  MCV 86.5 86.1 88.4  PLT 300 234 211   Cardiac Enzymes: No results for input(s): "CKTOTAL", "CKMB", "CKMBINDEX", "TROPONINI" in the last 168 hours. BNP: BNP (last 3 results) Recent Labs    09/29/22 1025  BNP 476.6*    ProBNP (last 3 results) No results for input(s): "PROBNP" in the last 8760 hours.  CBG: Recent Labs  Lab 01/05/23 0742 01/05/23 1118 01/05/23 1646 01/05/23 2230 01/06/23 0823  GLUCAP 196* 225* 194* 144* 153*       Signed:  Desma Maxim MD.  [facility] 01/06/2023, 11:07 AM

## 2023-01-06 NOTE — Inpatient Diabetes Management (Signed)
Inpatient Diabetes Program Recommendations  AACE/ADA: New Consensus Statement on Inpatient Glycemic Control   Target Ranges:  Prepandial:   less than 140 mg/dL      Peak postprandial:   less than 180 mg/dL (1-2 hours)      Critically ill patients:  140 - 180 mg/dL    Latest Reference Range & Units 01/05/23 07:42 01/05/23 11:18 01/05/23 16:46 01/05/23 22:30 01/06/23 08:23  Glucose-Capillary 70 - 99 mg/dL 196 (H) 225 (H) 194 (H) 144 (H) 153 (H)   Review of Glycemic Control  Diabetes history: DM2 Outpatient Diabetes medications: Glipizide XL 5 mg daily Current orders for Inpatient glycemic control: Levemir 10 units daily, Novolog 4 units TID with meals, Novolog 0-15 units TID with meals, Novolog 0-5 units QHS  NOTE: Patient to be discharged today. Spoke with patient and wife at bedside.  Patient's phone was not compatible with the FreeStyle Libre3 sensors so patient stated he would talk to his PCP about possibly using the [place] when he follows up with PCP. Discussed FreeStyle Libre3 reader that could be used to read the sensor since phone is not compatible. Discussed plan to discharge on insulin. Patient states he has used both vial/syringe and insulin pens in the past and he prefers to use insulin pens. Patient was able to verbalize steps of using an insulin pen. Patient's wife was able to provide successful demonstration of steps to use an insulin pen. Discussed insulin injection sites and importance of rotating injection sites. Patient has everything at home for glucose monitoring and plans to do finger sticks at home for now. Encouraged patient to reach out to his PCP if he has any issues at all with hypoglycemia so adjustments could be made with DM medications. Patient and wife verbalized understanding of information discussed and has no questions at this time.  Thanks, Barnie Alderman, RN, MSN, [place] Diabetes Coordinator Inpatient Diabetes Program [phone] (Team Pager from 8am to [place])

## 2023-01-07 ENCOUNTER — Encounter
Admit: 2023-01-07 | Discharge: 2023-01-07 | Disposition: A | Discharge status: Home / Self Care | Payer: Medicare Other | Attending: Surgery | Admitting: Surgery

## 2023-01-07 ENCOUNTER — Encounter: Payer: Self-pay | Admitting: Surgery

## 2023-01-07 ENCOUNTER — Other Ambulatory Visit: Payer: Self-pay

## 2023-01-07 DIAGNOSIS — Z01812 Encounter for preprocedural laboratory examination: Secondary | ICD-10-CM

## 2023-01-07 HISTORY — DX: Anemia, unspecified: D64.9

## 2023-01-07 HISTORY — DX: Personal history of urinary calculi: Z87.442

## 2023-01-07 HISTORY — DX: Anxiety disorder, unspecified: F41.9

## 2023-01-07 HISTORY — DX: Depression, unspecified: F32.A

## 2023-01-07 NOTE — Patient Instructions (Addendum)
Your procedure is scheduled on: 01/623 - Tuesday Report to the Registration Desk on the 1st floor of the [facility]. To find out your arrival time, please call [phone] between 1PM - 3PM on: 01/11/23 - Monday If your arrival time is 6:00 am, do not arrive prior to that time as the [facility] entrance doors do not open until 6:00 am.  REMEMBER: Instructions that are not followed completely may result in serious medical risk, up to and including death; or upon the discretion of your surgeon and anesthesiologist your surgery may need to be rescheduled.  Do not eat food after midnight the night before surgery.  No gum chewing, lozengers or hard candies.  You may however, drink CLEAR liquids up to 2 hours before you are scheduled to arrive for your surgery. Do not drink anything within 2 hours of your scheduled arrival time.  Clear liquids include: - water  - apple juice without pulp - gatorade (not RED colors) - black coffee or tea (Do NOT add milk or creamers to the coffee or tea) Do NOT drink anything that is not on this list.  TAKE THESE MEDICATIONS THE MORNING OF SURGERY WITH A SIP OF WATER:  - flecainide  - metoprolol (LOPRESSOR)  - sodium bicarbonate   apixaban (ELIQUIS) hold beginning 01/10/23 .  insulin glargine (LANTUS) only inject 1/2 of your prescribed dose of insulin the night before your surgery.  One week prior to surgery: Stop Anti-inflammatories (NSAIDS) such as Advil, Aleve, Ibuprofen, Motrin, Naproxen, Naprosyn and Aspirin based products such as Excedrin, Goodys Powder, BC Powder.  Stop ANY OVER THE COUNTER supplements until after surgery.  No Alcohol for 24 hours before or after surgery.  No Smoking including e-cigarettes for 24 hours prior to surgery.  No chewable tobacco products for at least 6 hours prior to surgery.  No nicotine patches on the day of surgery.  Do not use any "recreational" drugs for at least a week prior to your surgery.  Please  be advised that the combination of cocaine and anesthesia may have negative outcomes, up to and including death. If you test positive for cocaine, your surgery will be cancelled.  On the morning of surgery brush your teeth with toothpaste and water, you may rinse your mouth with mouthwash if you wish. Do not swallow any toothpaste or mouthwash.  Use CHG Soap or wipes as directed on instruction sheet.  Do not wear jewelry, make-up, hairpins, clips or nail polish.  Do not wear lotions, powders, or perfumes.   Do not shave body from the neck down 48 hours prior to surgery just in case you cut yourself which could leave a site for infection.  Also, freshly shaved skin may become irritated if using the CHG soap.  Contact lenses, hearing aids and dentures may not be worn into surgery.  Do not bring valuables to the hospital. [facility] is not responsible for any missing/lost belongings or valuables.   Notify your doctor if there is any change in your medical condition (cold, fever, infection).  Wear comfortable clothing (specific to your surgery type) to the hospital.  After surgery, you can help prevent lung complications by doing breathing exercises.  Take deep breaths and cough every 1-2 hours. Your doctor may order a device called an Incentive Spirometer to help you take deep breaths. When coughing or sneezing, hold a pillow firmly against your incision with both hands. This is called "splinting." Doing this helps protect your incision. It also decreases  belly discomfort.  If you are being admitted to the hospital overnight, leave your suitcase in the car. After surgery it may be brought to your room.  In case of increased patient census, it may be necessary for you, the patient, to continue your postoperative care within the Same Day Surgery department.  If you are being discharged the day of surgery, you will not be allowed to drive home. You will need a responsible individual to drive  you home and stay with you for 24 hours after surgery.   If you are taking public transportation, you will need to have a responsible adult (18 years or older) with you. Please confirm with your physician that it is acceptable to use public transportation.   Please call the [facility] Dept. at [phone] if you have any questions about these instructions.  Surgery Visitation Policy:  Patients undergoing a surgery or procedure may have two family members or support persons with them as long as the person is not COVID-19 positive or experiencing its symptoms.   Inpatient Visitation:    Visiting hours are 7 a.m. to 8 p.m. Up to four visitors are allowed at one time in a patient room. The visitors may rotate out with other people during the day. One designated support person (adult) may remain overnight.  Due to an increase in RSV and influenza rates and associated hospitalizations, children ages 19 and under will not be able to visit patients in [facility]. Masks continue to be strongly recommended.

## 2023-01-07 NOTE — Progress Notes (Addendum)
Perioperative Services  Pre-Admission/Anesthesia Testing Clinical Review  Date: 01/07/23  Patient Demographics:  Name: Nicolas Zuniga DOB:   1956/09/26 MRN:   [id number]  Planned Surgical Procedure(s):    Case: [id number] Date/Time: 01/12/23 1346   Procedure: INSERTION PORT-A-CATH   Anesthesia type: General   Pre-op diagnosis: colon cancer   Location: [facility] OR ROOM 07 / [place] [facility]S FOR ANESTHESIA GROUP   Surgeons: Jules Husbands, MD   NOTE: Available PAT nursing documentation and vital signs have been reviewed. Clinical nursing staff has updated patient's PMH/PSHx, current medication list, and drug allergies/intolerances to ensure comprehensive history available to assist in medical decision making as it pertains to the aforementioned surgical procedure and anticipated anesthetic course. Extensive review of available clinical information performed. Ward PMH and PSHx updated with any diagnoses/procedures that  may have been inadvertently omitted during his intake with the pre-admission testing department's nursing staff.  Clinical Discussion:  Nicolas Zuniga is a 67 y.o. male who is submitted for pre-surgical anesthesia review and clearance prior to him undergoing the above procedure. Patient has never been a smoker. 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  Patient is followed by cardiology Nehemiah Massed, MD). He was last seen in the cardiology clinic on 12/14/2022; notes reviewed.  At the time of his clinic visit, patient doing well overall from a cardiovascular perspective.  He denied any significant cardiovascular symptoms.  No episodes of chest pain, shortness of breath, PND, orthopnea, palpitations, significant peripheral edema, vertiginous symptoms, or presyncope/syncope.  Patient with a past medical history significant for cardiovascular diagnoses, including  atrial fibrillation.  Patient has undergone multiple DCCV procedures in the past for treatment of his atrial dysrhythmia.  DCCV procedures performed as follows: 07/09/2016 (100 J x 1), 12/22/2018 (120 J x 1), 08/18/2021 (120 J x 1) 03/11/2022 (120 J x 1), and 03/31/2022 (120 J x 1).  Procedures all resulted in restoration of normal sinus rhythm, however atrial arrhythmia has been refractory, thus requiring repeat procedures.  Most recent TTE was performed on 04/09/2022 revealing a normal left ventricular systolic function with EF of >55%. Left ventricular diastolic Doppler parameters consistent with abnormal relaxation (G1DD).  There was trivial tricuspid valve regurgitation.  There was no evidence of a significant transvalvular gradient to suggest stenosis.  Again, patient has a diagnosis of paroxysmal atrial fibrillation; CHA2DS2-VASc Score = 3 (age, HTN, T2DM). Patient's rate and rhythm currently being maintained on oral flecainide + metoprolol succinate.  He is chronically anticoagulated using standard dose apixaban.  Patient reportedly compliant with prescribed anticoagulation therapy with no evidence or reports of GI bleeding. Patient has been seen in consult by electrophysiology to discuss possible cardiac ablation. Given recent colon cancer diagnosis, the decision has been made to defer procedure until definitive plan has been made regarding his malignancy. Blood pressure well controlled at 120/80 mmHg on prescribed beta-blocker (metoprolol succinate) monotherapy.  Patient on omega-3 fatty acid for ASCVD prevention.  T2DM reasonably controlled on currently prescribed regimen; last HgbA1c was 7.2% when checked on 04/15/2022.  Patient does not have an OSAH diagnosis. Functional capacity, as defined by DASI, is documented as being >/= 4 METS.  No changes were made to his medication regimen.  Patient to follow-up with outpatient cardiology in 3 months or sooner if needed.  Nicolas Zuniga recently  underwent routine colonoscopy on 09/08/2022 which revealed multiple colonic polyps as well as large mass within the  ascending colon.  Areas were biopsied and pathology (+) for adenocarcinoma.  Patient has met with general surgery Dahlia Byes, MD), and following consultation to discuss clinical treatment options. He ultimately elected to undergo surgical resection via laparoscopic RIGHT colectomy, which he had done on 09/28/2022. 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 He is being followed by Dr. Randa Evens, MD or the [facility]. Treatment with systemic chemotherapy has been discussed. Patient understands that intent of therapy is palliative at this point. He now presents for INSERTION PORT-A-CATH for administration of antineoplastic chemotherapy. Procedure is scheduled for 01/12/2023 with Dr. Caroleen Hamman, MD Given patient's past medical history significant for cardiovascular diagnoses, presurgical cardiac clearance was sought by the PAT team. Per cardiology, "this patient is optimized for surgery and may proceed with the planned procedural course with a LOW risk of significant perioperative cardiovascular complications".    Again, this patient is on daily anticoagulation therapy.  He has been instructed on recommendations from his cardiologist for holding his apixaban dose for 2 days prior to his procedure with plans to restart since postoperative bleeding risk to be minimized by his primary attending surgeon.  Patient is aware that his last dose of apixaban should be on 01/09/2023.  Patient denies previous perioperative complications with anesthesia in the past. In review of the available records, it is noted that patient underwent a general anesthetic course here at [facility] (ASA III) in 09/2022 without documented complications.      01/06/2023    8:03 AM 01/06/2023    4:11 AM 01/06/2023    4:06 AM   Vitals with BMI  Weight  235 lbs   BMI  46.50   Systolic 354  656  Diastolic 64  75  Pulse 51  50    Providers/Specialists:   NOTE: Primary physician provider listed below. Patient may have been seen by APP or partner within same practice.   PROVIDER ROLE / SPECIALTY LAST Suszanne Finch, MD General Surgery (Surgeon) 12/28/2022  Hortencia Pilar, MD Primary Care Provider 11/23/2022  Serafina Royals, MD Cardiology 12/14/2022  Randa Evens, MD Medical Oncology 12/29/2022  Mylinda Latina, MD Electrophysiology 12/11/2022   Allergies:  Patient has no known allergies.  Current Home Medications:   No current facility-administered medications for this encounter.    apixaban (ELIQUIS) 5 MG TABS tablet   Cholecalciferol (VITAMIN D) 50 MCG (2000 UT) CAPS   Coenzyme Q10 (COQ10) 100 MG CAPS   flecainide (TAMBOCOR) 150 MG tablet   insulin glargine (LANTUS) 100 UNIT/ML Solostar Pen   Insulin Pen Needle (PEN NEEDLES 29GX1/2") 29G X 12MM MISC   metoprolol (LOPRESSOR) 100 MG tablet   Multiple Vitamins-Minerals (MULTIVITAMIN WITH MINERALS) tablet   Omega-3 Fatty Acids (FISH OIL) 1000 MG CAPS   sodium bicarbonate 650 MG tablet   Turmeric 400 MG CAPS   vitamin B-12 (CYANOCOBALAMIN) 100 MCG tablet   Vitamin E 400 units TABS   History:   Past Medical History:  Diagnosis Date   Anemia    Anxiety    Aortic atherosclerosis (HCC)    Cholelithiasis    Chronic gastritis    Colon polyps    Coronary artery calcification seen on CT scan    Depression    Diastolic dysfunction    a.) TTE 04/09/2022: EF >55%, triv TR, G1DD   Diverticulosis    History of kidney stones    Hypertension    Large cell  neuroendocrine carcinoma ([place]) 09/08/2022   a.) s/p hemicolectomy 09/28/2022; pathology (+) for 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 (pT4b pN2a)   Long term current use of anticoagulant    a.) apixaban   Nephrolithiasis    Paroxysmal A-fib ([place])    a.) CHA2DS2VASc = 3  (age, HTN, T2DM);  b.) s/p DCCV 07/09/2016 (100 J x1), 12/22/2018 (120 J x1), 08/18/2021 (120 J x1), 03/11/2022 (120 J x1), 03/31/2022 (120 J x1); c.) rate/rhythm maintained on oral flecanide + metoprolol succinate; chronically anticoagulated with apixaban   T2DM (type 2 diabetes mellitus) ([place])    Past Surgical History:  Procedure Laterality Date   CARDIOVERSION N/A 12/22/2018   Procedure: CARDIOVERSION (CATH LAB);  Surgeon: Corey Skains, MD;  Location: [facility] ORS;  Service: Cardiovascular;  Laterality: N/A;   CARDIOVERSION N/A 08/18/2021   Procedure: CARDIOVERSION;  Surgeon: Corey Skains, MD;  Location: [facility] ORS;  Service: Cardiovascular;  Laterality: N/A;   CARDIOVERSION N/A 03/11/2022   Procedure: CARDIOVERSION;  Surgeon: Corey Skains, MD;  Location: [facility] ORS;  Service: Cardiovascular;  Laterality: N/A;   CARDIOVERSION N/A 03/31/2022   Procedure: CARDIOVERSION;  Surgeon: Corey Skains, MD;  Location: [facility] ORS;  Service: Cardiovascular;  Laterality: N/A;   CHOLECYSTECTOMY N/A 09/24/2022   Procedure: LAPAROSCOPIC CHOLECYSTECTOMY;  Surgeon: Jules Husbands, MD;  Location: [facility] ORS;  Service: General;  Laterality: N/A;   COLONOSCOPY WITH PROPOFOL N/A 09/08/2022   Procedure: COLONOSCOPY WITH PROPOFOL;  Surgeon: Lucilla Lame, MD;  Location[facility] END[facility]COPY;  Service: Endoscopy;  Laterality: N/A;   ELECTROPHYSIOLOGIC STUDY N/A 07/09/2016   Procedure: CARDIOVERSION;  Surgeon: Dionisio David, MD;  Location: [facility] ORS;  Service: Cardiovascular;  Laterality: N/A;   LAPAROSCOPIC RIGHT COLECTOMY N/A 09/24/2022   Procedure: LAPAROSCOPIC RIGHT COLECTOMY, RNFA to assist;  Surgeon: Jules Husbands, MD;  Location: [facility] ORS;  Service: General;  Laterality: N/A;   LAPAROTOMY N/A 09/28/2022   Procedure: EXPLORATORY LAPAROTOMY WITH OSTOMY CREATION;  Surgeon: Jules Husbands, MD;  Location: [facility] ORS;  Service: General;  Laterality: N/A;   TEE WITHOUT CARDIOVERSION N/A 07/09/2016   Procedure:  TRANSESOPHAGEAL ECHOCARDIOGRAM (TEE);  Surgeon: Dionisio David, MD;  Location: [facility] ORS;  Service: Cardiovascular;  Laterality: N/A;   web fingers repaired     as a child   Family History  Problem Relation Age of Onset   Varicose Veins Mother    Heart disease Mother    Diabetes Mother    COPD Father    Social History   Tobacco Use   Smoking status: Never    Passive exposure: Never   Smokeless tobacco: Never  Vaping Use   Vaping Use: Never used  Substance Use Topics   Alcohol use: No   Drug use: No    Pertinent Clinical Results:  LABS: Labs reviewed: Acceptable for surgery.  Lab Results  Component Value Date   WBC 15.1 (H) 01/06/2023   HGB 13.3 01/06/2023   HCT 39.6 01/06/2023   MCV 88.4 01/06/2023   PLT 211 01/06/2023   Lab Results  Component Value Date   NA 129 (L) 01/06/2023   K 4.5 01/06/2023   CO2 15 (L) 01/06/2023   GLUCOSE 157 (H) 01/06/2023   BUN 75 (H) 01/06/2023   CREATININE 1.68 (H) 01/06/2023   CALCIUM 9.9 01/06/2023   GFRNONAA 45 (L) 01/06/2023    ECG: Date: 04/07/2022 Rate: 67 bpm Rhythm:  Normal sinus rhythm; nonspecific IVCD Axis (leads I and aVF): Normal Intervals: QRS  122 ms. QTc 429 ms. ST segment and T wave changes: No evidence of acute ST segment elevation or depression Comparison: Previous tracing obtained on 03/24/2022 showed atrial flutter.   NOTE: Tracing obtained at [facility]; unable for review. Above based on cardiologist's interpretation.    IMAGING / PROCEDURES: NM PET IMAGE INITIAL (PI) SKULL BASE TO THIGH performed on 12/22/2022 Status post right hemicolectomy with loop ileostomy and Hartmann's pouch. There is a small focus of loculated gas adjacent to the suture chain of the Hartmann's pouch which is nonspecific in the postoperative setting. Multifocal FDG avid liver metastases FDG avid soft tissue nodule within the small bowel mesentery compatible with metastatic adenopathy versus peritoneal disease could. Nodular  thickening along the left pericolic gutter adjacent to the descending colon is noted with mild increased radiotracer uptake. This is nonspecific and may reflect postsurgical change versus early peritoneal disease. Moderate right pleural effusion 3 mm right middle lobe lung nodule, too small to characterize by PET-CT. Aortic atherosclerosis  TRANSTHORACIC ECHOCARDIOGRAM performed on 09/29/2022 Normal left ventricular systolic function with a hyperdynamic EF of 65-70% Left ventricular doppler parameters normal. Normal right ventricular systolic function Trivial TR Normal transvalvular gradients; no valvular stenosis No pericardial effusion  CT CHEST, ABDOMEN,  PELVIS W CONTRAST performed on 09/17/2022 Interval concentric mass involving the right colon, ileocecal valve and distal terminal ileum, compatible with the patient's known colon carcinoma. No associated obstruction. Adjacent mass with central low density, compatible with local extension of necrotic tumor or an enlarged, necrotic metastatic lymph node. Multiple adjacent enlarged mesenteric lymph nodes medial to these masses, compatible with metastatic adenopathy. Colonic diverticulosis. Cholelithiasis. 4 mm mean diameter left lower lobe nodule possible 3 mm mean diameter right middle lobe nodule. These are felt to have a low likelihood of representing metastatic disease and could be followed on subsequent examinations.  Impression and Plan:  KOLTAN PORTOCARRERO has been referred for pre-anesthesia review and clearance prior to him undergoing the planned anesthetic and procedural courses. Available labs, pertinent testing, and imaging results were personally reviewed by me. This patient has been appropriately cleared by cardiology with an overall LOW risk of significant perioperative cardiovascular complications.  Based on clinical review performed today (01/07/23), barring any significant acute changes in the patient's overall condition, it is  anticipated that he will be able to proceed with the planned surgical intervention. Any acute changes in clinical condition may necessitate his procedure being postponed and/or cancelled. Patient will meet with anesthesia team (MD and/or CRNA) on the day of his procedure for preoperative evaluation/assessment. Questions regarding anesthetic course will be fielded at that time.   Pre-surgical instructions were reviewed with the patient during his PAT appointment and questions were fielded by PAT clinical staff. Patient was advised that if any questions or concerns arise prior to his procedure then he should return a call to PAT and/or his surgeon's office to discuss.  Honor Loh, MSN, APRN, FNP-C, CEN [facility]  Peri-operative Services Nurse Practitioner Phone: [phone] Fax: [phone] 01/07/23 5:00 PM  NOTE: This note has been prepared using Dragon dictation software. Despite my best ability to proofread, there is always the potential that unintentional transcriptional errors may still occur from this process.

## 2023-01-12 ENCOUNTER — Ambulatory Visit
Admission: RE | Admit: 2023-01-12 | Discharge: 2023-01-12 | Disposition: A | Discharge status: Home / Self Care | Payer: Medicare Other | Attending: Surgery | Admitting: Surgery

## 2023-01-12 ENCOUNTER — Other Ambulatory Visit: Payer: Self-pay

## 2023-01-12 ENCOUNTER — Inpatient Hospital Stay: Payer: Medicare Other | Admitting: Oncology

## 2023-01-12 ENCOUNTER — Ambulatory Visit: Payer: Medicare Other | Admitting: Urgent Care

## 2023-01-12 ENCOUNTER — Ambulatory Visit: Payer: Medicare Other

## 2023-01-12 ENCOUNTER — Encounter
Admission: RE | Disposition: A | Discharge status: Home / Self Care | Payer: Self-pay | Source: Home / Self Care | Attending: Surgery

## 2023-01-12 ENCOUNTER — Encounter: Payer: Self-pay | Admitting: Surgery

## 2023-01-12 DIAGNOSIS — Z7901 Long term (current) use of anticoagulants: Secondary | ICD-10-CM | POA: Insufficient documentation

## 2023-01-12 DIAGNOSIS — C7A8 Other malignant neuroendocrine tumors: Secondary | ICD-10-CM | POA: Insufficient documentation

## 2023-01-12 DIAGNOSIS — I2510 Atherosclerotic heart disease of native coronary artery without angina pectoris: Secondary | ICD-10-CM | POA: Insufficient documentation

## 2023-01-12 DIAGNOSIS — C7B8 Other secondary neuroendocrine tumors: Secondary | ICD-10-CM | POA: Insufficient documentation

## 2023-01-12 DIAGNOSIS — I700 Atherosclerosis of aorta: Secondary | ICD-10-CM | POA: Diagnosis not present

## 2023-01-12 DIAGNOSIS — I480 Paroxysmal atrial fibrillation: Secondary | ICD-10-CM | POA: Insufficient documentation

## 2023-01-12 DIAGNOSIS — D3A8 Other benign neuroendocrine tumors: Secondary | ICD-10-CM | POA: Diagnosis not present

## 2023-01-12 DIAGNOSIS — I10 Essential (primary) hypertension: Secondary | ICD-10-CM | POA: Insufficient documentation

## 2023-01-12 DIAGNOSIS — Z79899 Other long term (current) drug therapy: Secondary | ICD-10-CM | POA: Insufficient documentation

## 2023-01-12 DIAGNOSIS — Z01812 Encounter for preprocedural laboratory examination: Secondary | ICD-10-CM

## 2023-01-12 DIAGNOSIS — Z9049 Acquired absence of other specified parts of digestive tract: Secondary | ICD-10-CM | POA: Insufficient documentation

## 2023-01-12 DIAGNOSIS — E119 Type 2 diabetes mellitus without complications: Secondary | ICD-10-CM | POA: Diagnosis not present

## 2023-01-12 DIAGNOSIS — Z932 Ileostomy status: Secondary | ICD-10-CM | POA: Insufficient documentation

## 2023-01-12 HISTORY — PX: PORTACATH PLACEMENT: SHX2246

## 2023-01-12 HISTORY — DX: Atherosclerosis of aorta: I70.0

## 2023-01-12 LAB — GLUCOSE, CAPILLARY
Glucose-Capillary: 269 mg/dL — ABNORMAL HIGH (ref 70–99)
Glucose-Capillary: 243 mg/dL — ABNORMAL HIGH (ref 70–99)

## 2023-01-12 SURGERY — INSERTION, TUNNELED CENTRAL VENOUS DEVICE, WITH PORT
Anesthesia: General

## 2023-01-12 MED ORDER — ORAL CARE MOUTH RINSE: 15.0000 mL | Freq: Once | OROMUCOSAL | Status: DC

## 2023-01-12 MED ORDER — HEPARIN SODIUM (PORCINE) 5000 UNIT/ML IJ SOLN
INTRAMUSCULAR | Status: AC
  Filled 2023-01-12: qty 1

## 2023-01-12 MED ORDER — PROPOFOL 10 MG/ML IV BOLUS
INTRAVENOUS | Status: DC | PRN
  Administered 2023-01-12: 30 mg via INTRAVENOUS
  Administered 2023-01-12: 20 mg via INTRAVENOUS

## 2023-01-12 MED ORDER — CHLORHEXIDINE GLUCONATE CLOTH 2 % EX PADS: 6.0000 | MEDICATED_PAD | Freq: Once | CUTANEOUS | Status: DC

## 2023-01-12 MED ORDER — GABAPENTIN 300 MG PO CAPS: 300.0000 mg | ORAL_CAPSULE | ORAL | Status: AC

## 2023-01-12 MED ORDER — MIDAZOLAM HCL 2 MG/2ML IJ SOLN
INTRAMUSCULAR | Status: AC
  Filled 2023-01-12: qty 2

## 2023-01-12 MED ORDER — LIDOCAINE HCL (CARDIAC) PF 100 MG/5ML IV SOSY
PREFILLED_SYRINGE | INTRAVENOUS | Status: DC | PRN
  Administered 2023-01-12: 80 mg via INTRAVENOUS

## 2023-01-12 MED ORDER — MIDAZOLAM HCL 2 MG/2ML IJ SOLN
INTRAMUSCULAR | Status: DC | PRN
  Administered 2023-01-12: 2 mg via INTRAVENOUS

## 2023-01-12 MED ORDER — EPHEDRINE 5 MG/ML INJ
INTRAVENOUS | Status: AC
  Filled 2023-01-12: qty 5

## 2023-01-12 MED ORDER — PROPOFOL 1000 MG/100ML IV EMUL
INTRAVENOUS | Status: AC
  Filled 2023-01-12: qty 100

## 2023-01-12 MED ORDER — SODIUM CHLORIDE 0.9 % IR SOLN
Status: DC | PRN
  Administered 2023-01-12: 1 mL via INTRAVENOUS

## 2023-01-12 MED ORDER — EPHEDRINE SULFATE (PRESSORS) 50 MG/ML IJ SOLN
INTRAMUSCULAR | Status: DC | PRN
  Administered 2023-01-12: 10 mg via INTRAVENOUS
  Administered 2023-01-12: 5 mg via INTRAVENOUS
  Administered 2023-01-12 (×2): 10 mg via INTRAVENOUS

## 2023-01-12 MED ORDER — FENTANYL CITRATE (PF) 100 MCG/2ML IJ SOLN
INTRAMUSCULAR | Status: DC | PRN
  Administered 2023-01-12: 25 ug via INTRAVENOUS

## 2023-01-12 MED ORDER — ACETAMINOPHEN 500 MG PO TABS
ORAL_TABLET | ORAL | Status: AC
  Administered 2023-01-12: 1000 mg via ORAL
  Filled 2023-01-12: qty 2

## 2023-01-12 MED ORDER — SODIUM CHLORIDE FLUSH 0.9 % IV SOLN
INTRAVENOUS | Status: AC
  Filled 2023-01-12: qty 10

## 2023-01-12 MED ORDER — BUPIVACAINE-EPINEPHRINE 0.5% -1:200000 IJ SOLN
INTRAMUSCULAR | Status: DC | PRN
  Administered 2023-01-12: 10 mL

## 2023-01-12 MED ORDER — LIDOCAINE HCL (PF) 2 % IJ SOLN
INTRAMUSCULAR | Status: AC
  Filled 2023-01-12: qty 5

## 2023-01-12 MED ORDER — BUPIVACAINE-EPINEPHRINE (PF) 0.5% -1:200000 IJ SOLN
INTRAMUSCULAR | Status: AC
  Filled 2023-01-12: qty 30

## 2023-01-12 MED ORDER — ACETAMINOPHEN 500 MG PO TABS: 1000.0000 mg | ORAL_TABLET | ORAL | Status: AC

## 2023-01-12 MED ORDER — ONDANSETRON HCL 4 MG/2ML IJ SOLN
INTRAMUSCULAR | Status: AC
  Filled 2023-01-12: qty 2

## 2023-01-12 MED ORDER — PROPOFOL 500 MG/50ML IV EMUL
INTRAVENOUS | Status: DC | PRN
  Administered 2023-01-12: 80 ug/kg/min via INTRAVENOUS

## 2023-01-12 MED ORDER — PHENYLEPHRINE 80 MCG/ML (10ML) SYRINGE FOR IV PUSH (FOR BLOOD PRESSURE SUPPORT)
PREFILLED_SYRINGE | INTRAVENOUS | Status: AC
  Filled 2023-01-12: qty 10

## 2023-01-12 MED ORDER — FAMOTIDINE 20 MG PO TABS
ORAL_TABLET | ORAL | Status: AC
  Administered 2023-01-12: 20 mg via ORAL
  Filled 2023-01-12: qty 1

## 2023-01-12 MED ORDER — CEFAZOLIN SODIUM-DEXTROSE 2-4 GM/100ML-% IV SOLN
INTRAVENOUS | Status: AC
  Filled 2023-01-12: qty 100

## 2023-01-12 MED ORDER — BUPIVACAINE HCL (PF) 0.25 % IJ SOLN
INTRAMUSCULAR | Status: AC
  Filled 2023-01-12: qty 30

## 2023-01-12 MED ORDER — CEFAZOLIN SODIUM-DEXTROSE 2-4 GM/100ML-% IV SOLN
2.0000 g | INTRAVENOUS | Status: AC
  Administered 2023-01-12: 2 g via INTRAVENOUS

## 2023-01-12 MED ORDER — GABAPENTIN 300 MG PO CAPS
ORAL_CAPSULE | ORAL | Status: AC
  Administered 2023-01-12: 300 mg via ORAL
  Filled 2023-01-12: qty 1

## 2023-01-12 MED ORDER — SODIUM CHLORIDE 0.9 % IV SOLN: INTRAVENOUS | Status: DC

## 2023-01-12 MED ORDER — PHENYLEPHRINE 80 MCG/ML (10ML) SYRINGE FOR IV PUSH (FOR BLOOD PRESSURE SUPPORT)
PREFILLED_SYRINGE | INTRAVENOUS | Status: DC | PRN
  Administered 2023-01-12: 80 ug via INTRAVENOUS
  Administered 2023-01-12: 160 ug via INTRAVENOUS
  Administered 2023-01-12: 80 ug via INTRAVENOUS

## 2023-01-12 MED ORDER — ACETAMINOPHEN 325 MG PO TABS: 325.0000 mg | ORAL_TABLET | ORAL | Status: DC | PRN

## 2023-01-12 MED ORDER — HYDROCODONE-ACETAMINOPHEN 7.5-325 MG PO TABS: 1.0000 | ORAL_TABLET | Freq: Once | ORAL | Status: DC | PRN

## 2023-01-12 MED ORDER — FENTANYL CITRATE (PF) 100 MCG/2ML IJ SOLN: 25.0000 ug | INTRAMUSCULAR | Status: DC | PRN

## 2023-01-12 MED ORDER — FAMOTIDINE 20 MG PO TABS: 20.0000 mg | ORAL_TABLET | Freq: Once | ORAL | Status: AC

## 2023-01-12 MED ORDER — FENTANYL CITRATE (PF) 100 MCG/2ML IJ SOLN
INTRAMUSCULAR | Status: AC
  Filled 2023-01-12: qty 2

## 2023-01-12 MED ORDER — ONDANSETRON HCL 4 MG/2ML IJ SOLN
INTRAMUSCULAR | Status: DC | PRN
  Administered 2023-01-12: 4 mg via INTRAVENOUS

## 2023-01-12 MED ORDER — ACETAMINOPHEN 160 MG/5ML PO SOLN: 325.0000 mg | ORAL | Status: DC | PRN

## 2023-01-12 MED ORDER — LIDOCAINE HCL (PF) 1 % IJ SOLN
INTRAMUSCULAR | Status: AC
  Filled 2023-01-12: qty 30

## 2023-01-12 MED ORDER — LIDOCAINE HCL (PF) 1 % IJ SOLN
INTRAMUSCULAR | Status: DC | PRN
  Administered 2023-01-12: 10 mL

## 2023-01-12 MED ORDER — ONDANSETRON HCL 4 MG/2ML IJ SOLN: 4.0000 mg | Freq: Once | INTRAMUSCULAR | Status: DC | PRN

## 2023-01-12 MED ORDER — PROPOFOL 500 MG/50ML IV EMUL: INTRAVENOUS | Status: DC | PRN

## 2023-01-12 MED ORDER — CHLORHEXIDINE GLUCONATE 0.12 % MT SOLN: 15.0000 mL | Freq: Once | OROMUCOSAL | Status: DC

## 2023-01-12 SURGICAL SUPPLY — 38 items
ADH SKN CLS APL DERMABOND .7 (GAUZE/BANDAGES/DRESSINGS) ×1
APL PRP STRL LF DISP 70% ISPRP (MISCELLANEOUS) ×1
BAG DECANTER FOR FLEXI CONT (MISCELLANEOUS) ×4 IMPLANT
BLADE SURG SZ11 CARB STEEL (BLADE) ×1 IMPLANT
BOOT SUTURE AID YELLOW STND (SUTURE) ×1 IMPLANT
CHLORAPREP W/TINT 26 (MISCELLANEOUS) ×1 IMPLANT
COVER LIGHT HANDLE STERIS (MISCELLANEOUS) ×2 IMPLANT
DERMABOND ADVANCED .7 DNX12 (GAUZE/BANDAGES/DRESSINGS) ×1 IMPLANT
DRAPE 3/4 80X56 (DRAPES) ×1 IMPLANT
DRAPE C-ARM XRAY 36X54 (DRAPES) ×2 IMPLANT
DRAPE INCISE IOBAN 66X45 STRL (DRAPES) ×1 IMPLANT
ELECT CAUTERY BLADE 6.4 (BLADE) ×1 IMPLANT
ELECT REM PT RETURN 9FT ADLT (ELECTROSURGICAL) ×1
ELECTRODE REM PT RTRN 9FT ADLT (ELECTROSURGICAL) ×2 IMPLANT
GAUZE 4X4 16PLY ~~LOC~~+RFID DBL (SPONGE) ×1 IMPLANT
GEL ULTRASOUND 20GR AQUASONIC (MISCELLANEOUS) ×1 IMPLANT
GLOVE BIO SURGEON STRL SZ7 (GLOVE) ×1 IMPLANT
GOWN STRL REUS W/ TWL LRG LVL3 (GOWN DISPOSABLE) ×2 IMPLANT
GOWN STRL REUS W/TWL LRG LVL3 (GOWN DISPOSABLE) ×2
IV NS 500ML (IV SOLUTION) ×1
IV NS 500ML BAXH (IV SOLUTION) ×1 IMPLANT
KIT PORT POWER 8FR ISP CVUE (Port) ×1 IMPLANT
MANIFOLD NEPTUNE II (INSTRUMENTS) ×1 IMPLANT
NEEDLE HYPO 22GX1.5 SAFETY (NEEDLE) ×1 IMPLANT
PACK PORT-A-CATH (MISCELLANEOUS) ×1 IMPLANT
SPONGE T-LAP 18X18 ~~LOC~~+RFID (SPONGE) ×1 IMPLANT
SUT MNCRL AB 4-0 PS2 18 (SUTURE) ×2 IMPLANT
SUT PROLENE 2-0 (SUTURE) ×1
SUT PROLENE 2-0 RB1 36X2 ARM (SUTURE) ×1
SUT VIC AB 3-0 SH 27 (SUTURE) ×1
SUT VIC AB 3-0 SH 27X BRD (SUTURE) ×1 IMPLANT
SUTURE PROLEN 2-0 RB1 36X2 ARM (SUTURE) ×1 IMPLANT
SYR 10ML LL (SYRINGE) ×1 IMPLANT
SYR 20ML LL LF (SYRINGE) ×1 IMPLANT
SYR 5ML LL (SYRINGE) ×1 IMPLANT
TOWEL OR 17X26 4PK STRL BLUE (TOWEL DISPOSABLE) ×1 IMPLANT
TRAP FLUID SMOKE EVACUATOR (MISCELLANEOUS) ×1 IMPLANT
WATER STERILE IRR 500ML POUR (IV SOLUTION) ×1 IMPLANT

## 2023-01-12 NOTE — Discharge Instructions (Addendum)
Implanted Port Insertion, Care After The following information offers guidance on how to care for yourself after your procedure. Your health care provider may also give you more specific instructions. If you have problems or questions, contact your health care provider. What can I expect after the procedure? After the procedure, it is common to have: Discomfort at the port insertion site. Bruising on the skin over the port. This should improve over 3-4 days. Follow these instructions at home: [facility] care After your port is placed, you will get a manufacturer's information card. The card has information about your port. Keep this card with you at all times. Take care of the port as told by your health care provider. Ask your health care provider if you or a family member can get training for taking care of the port at home. A home health care nurse will be be available to help care for the port. Make sure to remember what type of port you have. Incision care     Follow instructions from your health care provider about how to take care of your port insertion site. Make sure you: Wash your hands with soap and water for at least 20 seconds before and after you change your bandage (dressing). If soap and water are not available, use hand sanitizer. Change your dressing as told by your health care provider. Leave stitches (sutures), skin glue, or adhesive strips in place. These skin closures may need to stay in place for 2 weeks or longer. If adhesive strip edges start to loosen and curl up, you may trim the loose edges. Do not remove adhesive strips completely unless your health care provider tells you to do that. Check your port insertion site every day for signs of infection. Check for: Redness, swelling, or pain. Fluid or blood. Warmth. Pus or a bad smell. Activity Return to your normal activities as told by your health care provider. Ask your health care provider what activities are safe for  you. You may have to avoid lifting. Ask your health care provider how much you can safely lift. General instructions Take over-the-counter and prescription medicines only as told by your health care provider. Do not take baths, swim, or use a hot tub until your health care provider approves. Ask your health care provider if you may take showers. You may only be allowed to take sponge baths. If you were given a sedative during the procedure, it can affect you for several hours. Do not drive or operate machinery until your health care provider says that it is safe. Wear a medical alert bracelet in case of an emergency. This will tell any health care providers that you have a port. Keep all follow-up visits. This is important. Contact a health care provider if: You cannot flush your port with saline as directed, or you cannot draw blood from the port. You have a fever or chills. You have redness, swelling, or pain around your port insertion site. You have fluid or blood coming from your port insertion site. Your port insertion site feels warm to the touch. You have pus or a bad smell coming from the port insertion site. Get help right away if: You have chest pain or shortness of breath. You have bleeding from your port that you cannot control. These symptoms may be an emergency. Get help right away. Call 911. Do not wait to see if the symptoms will go away. Do not drive yourself to the hospital. Summary Take care of the  port as told by your health care provider. Keep the manufacturer's information card with you at all times. Change your dressing as told by your health care provider. Contact a health care provider if you have a fever or chills or if you have redness, swelling, or pain around your port insertion site. Keep all follow-up visits. This information is not intended to replace advice given to you by your health care provider. Make sure you discuss any questions you have with your  health care provider. Document Revised: 05/27/2021 Document Reviewed: 05/27/2021 Elsevier Patient Education  2023 [employer]AMBULATORY SURGERY  DISCHARGE INSTRUCTIONS   The drugs that you were given will stay in your system until tomorrow so for the next 24 hours you should not:  Drive an automobile Make any legal decisions Drink any alcoholic beverage   You may resume regular meals tomorrow.  Today it is better to start with liquids and gradually work up to solid foods.  You may eat anything you prefer, but it is better to start with liquids, then soup and crackers, and gradually work up to solid foods.   Please notify your doctor immediately if you have any unusual bleeding, trouble breathing, redness and pain at the surgery site, drainage, fever, or pain not relieved by medication.    Additional Instructions:   Please contact your physician with any problems or Same Day Surgery at [phone], Monday through Friday 6 am to 4 pm, or [place] at [facility] number at [phone].

## 2023-01-12 NOTE — Anesthesia Preprocedure Evaluation (Signed)
Anesthesia Evaluation  Patient identified by MRN, date of birth, ID band Patient awake    Reviewed: Allergy & Precautions, NPO status , Patient's Chart, lab work & pertinent test results  Airway Mallampati: III  TM Distance: >3 FB Neck ROM: full    Dental  (+) Chipped, Poor Dentition   Pulmonary neg pulmonary ROS   Pulmonary exam normal        Cardiovascular hypertension, + CAD  negative cardio ROS Normal cardiovascular exam     Neuro/Psych  PSYCHIATRIC DISORDERS Anxiety Depression    negative neurological ROS     GI/Hepatic negative GI ROS, Neg liver ROS,neg GERD  ,,Has had phlegm to spit out of mouth, no GERD   Endo/Other  negative endocrine ROSdiabetes    Renal/GU Renal disease  negative genitourinary   Musculoskeletal   Abdominal   Peds  Hematology  (+) Blood dyscrasia, anemia   Anesthesia Other Findings Past Medical History: No date: Anemia No date: Anxiety No date: Aortic atherosclerosis (HCC) No date: Cholelithiasis No date: Chronic gastritis No date: Colon polyps No date: Coronary artery calcification seen on CT scan No date: Depression No date: Diastolic dysfunction     Comment:  a.) TTE 04/09/2022: EF >55%, triv TR, G1DD No date: Diverticulosis No date: History of kidney stones No date: Hypertension 09/08/2022: 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[place])     Comment:  a.) s/p hemicolectomy 09/28/2022; pathology (+) for               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) No date: Long term current use of anticoagulant     Comment:  a.) apixaban No date: Nephrolithiasis No date: Paroxysmal A-fib ([place])     Comment:  a.) CHA2DS2VASc = 3 (age, HTN, T2DM);  b.) s/p DCCV               07/09/2016 (100 J x1), 12/22/2018 (120 J x1), 08/18/2021               (120 J x1), 03/11/2022 (120 J x1), 03/31/2022 (120 J x1);              c.) rate/rhythm maintained on  oral flecanide + metoprolol              succinate; chronically anticoagulated with apixaban No date: T2DM (type 2 diabetes mellitus) ([place])  Past Surgical History: 12/22/2018: CARDIOVERSION; N/A     Comment:  Procedure: CARDIOVERSION (CATH LAB);  Surgeon: Corey Skains, MD;  Location: [facility];  Service:               Cardiovascular;  Laterality: N/A; 08/18/2021: CARDIOVERSION; N/A     Comment:  Procedure: CARDIOVERSION;  Surgeon: Corey Skains,               MD;  Location: [facility];  Service: Cardiovascular;                Laterality: N/A; 03/11/2022: CARDIOVERSION; N/A     Comment:  Procedure: CARDIOVERSION;  Surgeon: Corey Skains,               MD;  Location: [facility];  Service: Cardiovascular;                Laterality: N/A; 03/31/2022: CARDIOVERSION; N/A     Comment:  Procedure:  CARDIOVERSION;  Surgeon: Corey Skains,               MD;  Location: [facility];  Service: Cardiovascular;                Laterality: N/A; 09/24/2022: CHOLECYSTECTOMY; N/A     Comment:  Procedure: LAPAROSCOPIC CHOLECYSTECTOMY;  Surgeon:               Jules Husbands, MD;  Location: [facility];  Service:               General;  Laterality: N/A; 09/08/2022: COLONOSCOPY WITH PROPOFOL; N/A     Comment:  Procedure: COLONOSCOPY WITH PROPOFOL;  Surgeon: Lucilla Lame, MD;  Location: [facility];  Service:               Endoscopy;  Laterality: N/A; 07/09/2016: ELECTROPHYSIOLOGIC STUDY; N/A     Comment:  Procedure: CARDIOVERSION;  Surgeon: Dionisio David, MD;               Location: [facility];  Service: Cardiovascular;                Laterality: N/A; 09/24/2022: LAPAROSCOPIC RIGHT COLECTOMY; N/A     Comment:  Procedure: LAPAROSCOPIC RIGHT COLECTOMY, RNFA to assist;              Surgeon: Jules Husbands, MD;  Location: [facility];                Service: General;  Laterality: N/A; 09/28/2022: LAPAROTOMY; N/A     Comment:  Procedure: [place];               Surgeon: Jules Husbands, MD;  Location: [facility];                Service: General;  Laterality: N/A; 07/09/2016: TEE WITHOUT CARDIOVERSION; N/A     Comment:  Procedure: TRANSESOPHAGEAL ECHOCARDIOGRAM (TEE);                Surgeon: Dionisio David, MD;  Location: [facility];                Service: Cardiovascular;  Laterality: N/A; No date: web fingers repaired     Comment:  as a child  BMI    Body Mass Index: 27.99 kg/m      Reproductive/Obstetrics negative OB ROS                             Anesthesia Physical Anesthesia Plan  ASA: 3  Anesthesia Plan: General   Post-op Pain Management:    Induction: Intravenous  PONV Risk Score and Plan: Propofol infusion and TIVA  Airway Management Planned: Natural Airway and Nasal Cannula  Additional Equipment:   Intra-op Plan:   Post-operative Plan:   Informed Consent: I have reviewed the patients History and Physical, chart, labs and discussed the procedure including the risks, benefits and alternatives for the proposed anesthesia with the patient or authorized representative who has indicated his/her understanding and acceptance.     Dental Advisory Given  Plan Discussed with: Anesthesiologist, CRNA and Surgeon  Anesthesia Plan Comments: (Patient consented for risks of anesthesia including but not limited to:  - adverse reactions to medications - risk of airway placement if required - damage to eyes, teeth, lips or other oral mucosa - nerve  damage due to positioning  - sore throat or hoarseness - Damage to heart, brain, nerves, lungs, other parts of body or loss of life  Patient voiced understanding.)       Anesthesia Quick Evaluation

## 2023-01-12 NOTE — Op Note (Signed)
  Pre-operative Diagnosis: Colon CA  Post-operative Diagnosis: same   Surgeon: Caroleen Hamman, MD FACS  Anesthesia: IV sedation, marcaine .5% w epi and lidocaine 1%  Procedure: right IJ  Port placement with fluoroscopy under U/S guidance  Findings: Good position of the tip of the catheter by fluoroscopy  Estimated Blood Loss: Minimal         Drains: None         Specimens: None       Complications: none            Procedure Details  The patient was seen again in the Holding Room. The benefits, complications, treatment options, and expected outcomes were discussed with the patient. The risks of bleeding, infection, recurrence of symptoms, failure to resolve symptoms,  thrombosis nonfunction breakage pneumothorax hemopneumothorax any of which could require chest tube or further surgery were reviewed with the patient.   The patient was taken to Operating Room, identified as Nicolas Zuniga and the procedure verified.  A Time Out was held and the above information confirmed.  Prior to the induction of general anesthesia, antibiotic prophylaxis was administered. VTE prophylaxis was in place. Appropriate anesthesia was then administered and tolerated well. The chest was prepped with Chloraprep and draped in the sterile fashion. The patient was positioned in the supine position. Then the patient was placed in Trendelenburg position.  Patient was prepped and draped in sterile fashion and in a Trendelenburg position local anesthetic was infiltrated into the skin and subcutaneous tissues in the neck and anterior chest wall. The large bore needle was placed into the internal jugular vein under U/S guidance without difficulty and then the Seldinger wire was advanced. Fluoroscopy was utilized to confirm that the Seldinger wire was in the superior vena cava.  An incision was made and a port pocket developed with blunt and electrocautery dissection. The introducer dilator was placed over the Seldinger  wire the wire was removed. The previously flushed catheter was placed into the introducer dilator and the peel-away sheath was removed. The catheter length was confirmed and trimmed utilizing fluoroscopy for proper positioning. The catheter was then attached to the previously flushed port. The port was placed into the pocket. The port was held in with 2-0 Prolenes and flushed for function and heparin locked.  The wound was closed with interrupted 3-0 Vicryl followed by 4-0 subcuticular Monocryl sutures. Dermabond used to coat the skin  Patient was taken to the recovery room in stable condition where a postoperative chest film has been ordered.

## 2023-01-12 NOTE — Anesthesia Postprocedure Evaluation (Signed)
Anesthesia Post Note  Patient: Nicolas Zuniga  Procedure(s) Performed: INSERTION PORT-A-CATH  Patient location during evaluation: PACU Anesthesia Type: General Level of consciousness: awake and alert Pain management: pain level controlled Vital Signs Assessment: post-procedure vital signs reviewed and stable Respiratory status: spontaneous breathing, nonlabored ventilation, respiratory function stable and patient connected to nasal cannula oxygen Cardiovascular status: blood pressure returned to baseline and stable Postop Assessment: no apparent nausea or vomiting Anesthetic complications: no   No notable events documented.   Last Vitals:  Vitals:   01/12/23 1520 01/12/23 1530  BP: 109/65 111/60  Pulse: (!) 58 (!) 54  Resp: 14 16  Temp: (!) 36.2 C   SpO2: 100% 100%    Last Pain:  Vitals:   01/12/23 1530  TempSrc:   PainSc: 0-No pain                 Arita Miss

## 2023-01-12 NOTE — Interval H&P Note (Signed)
History and Physical Interval Note:  01/12/2023 1:43 PM  Nicolas Zuniga  has presented today for surgery, with the diagnosis of colon cancer.  The various methods of treatment have been discussed with the patient and family. After consideration of risks, benefits and other options for treatment, the patient has consented to  Procedure(s): INSERTION PORT-A-CATH (N/A) as a surgical intervention.  The patient's history has been reviewed, patient examined, no change in status, stable for surgery.  I have reviewed the patient's chart and labs.  Questions were answered to the patient's satisfaction.     [place]

## 2023-01-12 NOTE — Transfer of Care (Deleted)
Immediate Anesthesia Transfer of Care Note  Patient: Nicolas Zuniga  Procedure(s) Performed: INSERTION PORT-A-CATH  Patient Location: PACU  Anesthesia Type:General  Level of Consciousness: awake and alert   Airway & Oxygen Therapy: Patient Spontanous Breathing  Post-op Assessment: Report given to RN and Post -op Vital signs reviewed and stable  Post vital signs: stable  Last Vitals:  Vitals Value Taken Time  BP    Temp 97.1   Pulse 58 01/12/23 1520  Resp 14 01/12/23 1520  SpO2 100 % 01/12/23 1520  Vitals shown include unvalidated device data.  Last Pain:  Vitals:   01/12/23 1257  TempSrc: Tympanic  PainSc: 0-No pain         Complications: No notable events documented.

## 2023-01-12 NOTE — Transfer of Care (Signed)
Immediate Anesthesia Transfer of Care Note  Patient: Nicolas Zuniga  Procedure(s) Performed: INSERTION PORT-A-CATH  Patient Location: PACU  Anesthesia Type:General  Level of Consciousness: awake and alert   Airway & Oxygen Therapy: Patient Spontanous Breathing  Post-op Assessment: Report given to RN and Post -op Vital signs reviewed and stable  Post vital signs: stable  Last Vitals:  Vitals Value Taken Time  BP 109/65 01/12/23 1520  Temp 36.2 C 01/12/23 1520  Pulse 54 01/12/23 1523  Resp 16 01/12/23 1523  SpO2 100 % 01/12/23 1523  Vitals shown include unvalidated device data.  Last Pain:  Vitals:   01/12/23 1520  TempSrc:   PainSc: 0-No pain         Complications: No notable events documented.

## 2023-01-13 ENCOUNTER — Encounter: Payer: Self-pay | Admitting: Surgery

## 2023-01-19 ENCOUNTER — Encounter: Payer: Self-pay | Admitting: Oncology

## 2023-01-19 ENCOUNTER — Inpatient Hospital Stay: Payer: Medicare Other | Attending: Oncology | Admitting: Oncology

## 2023-01-19 DIAGNOSIS — C7A8 Other malignant neuroendocrine tumors: Secondary | ICD-10-CM | POA: Diagnosis not present

## 2023-01-19 DIAGNOSIS — Z7189 Other specified counseling: Secondary | ICD-10-CM | POA: Diagnosis not present

## 2023-01-19 DIAGNOSIS — C787 Secondary malignant neoplasm of liver and intrahepatic bile duct: Secondary | ICD-10-CM

## 2023-01-19 NOTE — Progress Notes (Signed)
I connected with Nicolas Zuniga on 01/19/23 at 10:45 AM EST by video enabled telemedicine visit and verified that I am speaking with the correct person using two identifiers.   I discussed the limitations, risks, security and privacy concerns of performing an evaluation and management service by telemedicine and the availability of in-person appointments. I also discussed with the patient that there may be a patient responsible charge related to this service. The patient expressed understanding and agreed to proceed.  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  He underwent a colonoscopy on 09/08/2022 which showed a malignant partially obstructing tumor in the ascending colon along with 2 other subcentimeter polyps.  Biopsy showed single fragment of adenocarcinoma.  He underwent laparoscopic right colectomy and cholecystectomy on 09/24/2022.  Operative findings showed a large right colon mass adherent to the retroperitoneum postoperative course was complicated by hypotension and bradycardia as well as A-fib with RVR.  CT abdomen and pelvis in October 2022 showed small leak but no contrast extravasation.  He was on antibiotics.  There was concern for fluid collection next to the anastomosis and therefore patient was taken back to the OR for exploratory laparotomy which did not show any leak or bowel injury.  However intra-abdominal fluid was purulent with significant inflammatory response.  He underwent placement of JP drain and diverting loop colostomy.  Patient required intubation initially but was successfully extubated on 10/06/2022.  He was again found to  have A-fib with RVR hypotension and anemia and transferred back to the ICU.  Presently patient is on IV micafungin for concerns of fungal infection.   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  4 out of 25 lymph nodes were positive for malignancy.  [id number].  Carcinoma positive for synaptophysin and negative for chromogranin.  Morphologic features in conjunction with synaptophysin positivity are consistent with the diagnosis of large cell neuroendocrine carcinoma.  Within the large cell neuroendocrine carcinoma is a minor component of poorly differentiated adenocarcinoma with signet cell morphology.  Tumor consists of less than 30% poorly differentiated adenocarcinoma and therefore being classified as large cell neuroendocrine carcinoma.     Interval history: After my visit with him in January 2024 he has had further decline in his performance status.  He was admitted to the ICU in January for diabetic ketoacidosis and after that he has been feeling quite weak.  He spends most of his day resting and needs a wheelchair to go around within the house.  He is just about able to transfer himself from the wheelchair to chair.  He was unable to come for an in person appointment with me today.   Review of Systems  Constitutional:  Positive for malaise/fatigue and weight loss. Negative for chills and fever.  HENT:  Negative for congestion, ear discharge and nosebleeds.   Eyes:  Negative for blurred vision.  Respiratory:  Negative for cough, hemoptysis, sputum production, shortness of breath and wheezing.   Cardiovascular:  Negative for chest pain, palpitations, orthopnea and claudication.  Gastrointestinal:  Negative for abdominal pain, blood in stool, constipation, diarrhea, heartburn, melena, nausea and vomiting.  Genitourinary:  Negative for  dysuria, flank pain, frequency, hematuria and urgency.  Musculoskeletal:  Negative  for back pain, joint pain and myalgias.  Skin:  Negative for rash.  Neurological:  Negative for dizziness, tingling, focal weakness, seizures, weakness and headaches.  Endo/Heme/Allergies:  Does not bruise/bleed easily.  Psychiatric/Behavioral:  Negative for depression and suicidal ideas. The patient does not have insomnia.     No Known Allergies  Past Medical History:  Diagnosis Date   Anemia    Anxiety    Aortic atherosclerosis (HCC)    Cholelithiasis    Chronic gastritis    Colon polyps    Coronary artery calcification seen on CT scan    Depression    Diastolic dysfunction    a.) TTE 04/09/2022: EF >55%, triv TR, G1DD   Diverticulosis    History of kidney stones    Hypertension    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[place]) 09/08/2022   a.) s/p hemicolectomy 09/28/2022; pathology (+) for 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 (pT4b pN2a)   Long term current use of anticoagulant    a.) apixaban   Nephrolithiasis    Paroxysmal A-fib ([place])    a.) CHA2DS2VASc = 3 (age, HTN, T2DM);  b.) s/p DCCV 07/09/2016 (100 J x1), 12/22/2018 (120 J x1), 08/18/2021 (120 J x1), 03/11/2022 (120 J x1), 03/31/2022 (120 J x1); c.) rate/rhythm maintained on oral flecanide + metoprolol succinate; chronically anticoagulated with apixaban   T2DM (type 2 diabetes mellitus) ([place])     Past Surgical History:  Procedure Laterality Date   CARDIOVERSION N/A 12/22/2018   Procedure: CARDIOVERSION (CATH LAB);  Surgeon: Corey Skains, MD;  Location: [facility] ORS;  Service: Cardiovascular;  Laterality: N/A;   CARDIOVERSION N/A 08/18/2021   Procedure: CARDIOVERSION;  Surgeon: Corey Skains, MD;  Location: [facility] ORS;  Service: Cardiovascular;  Laterality: N/A;   CARDIOVERSION N/A 03/11/2022   Procedure: CARDIOVERSION;  Surgeon: Corey Skains, MD;  Location: [facility] ORS;  Service: Cardiovascular;  Laterality: N/A;   CARDIOVERSION N/A 03/31/2022   Procedure: CARDIOVERSION;  Surgeon:  Corey Skains, MD;  Location: [facility] ORS;  Service: Cardiovascular;  Laterality: N/A;   CHOLECYSTECTOMY N/A 09/24/2022   Procedure: LAPAROSCOPIC CHOLECYSTECTOMY;  Surgeon: Jules Husbands, MD;  Location: [facility] ORS;  Service: General;  Laterality: N/A;   COLONOSCOPY WITH PROPOFOL N/A 09/08/2022   Procedure: COLONOSCOPY WITH PROPOFOL;  Surgeon: Lucilla Lame, MD;  Location[facility] END[facility]COPY;  Service: Endoscopy;  Laterality: N/A;   ELECTROPHYSIOLOGIC STUDY N/A 07/09/2016   Procedure: CARDIOVERSION;  Surgeon: Dionisio David, MD;  Location: [facility] ORS;  Service: Cardiovascular;  Laterality: N/A;   LAPAROSCOPIC RIGHT COLECTOMY N/A 09/24/2022   Procedure: LAPAROSCOPIC RIGHT COLECTOMY, RNFA to assist;  Surgeon: Jules Husbands, MD;  Location: [facility] ORS;  Service: General;  Laterality: N/A;   LAPAROTOMY N/A 09/28/2022   Procedure: EXPLORATORY LAPAROTOMY WITH OSTOMY CREATION;  Surgeon: Jules Husbands, MD;  Location: [facility] ORS;  Service: General;  Laterality: N/A;   PORTACATH PLACEMENT N/A 01/12/2023   Procedure: INSERTION PORT-A-CATH;  Surgeon: Jules Husbands, MD;  Location: [facility] ORS;  Service: General;  Laterality: N/A;   TEE WITHOUT CARDIOVERSION N/A 07/09/2016   Procedure: TRANSESOPHAGEAL ECHOCARDIOGRAM (TEE);  Surgeon: Dionisio David, MD;  Location: [facility] ORS;  Service: Cardiovascular;  Laterality: N/A;   web fingers repaired     as a child    Social History   Socioeconomic History   Marital status: Married    Spouse name: Jerelyn Charles   Number of children: Not  on file   Years of education: Not on file   Highest education level: Not on file  Occupational History   Not on file  Tobacco Use   Smoking status: Never    Passive exposure: Never   Smokeless tobacco: Never  Vaping Use   Vaping Use: Never used  Substance and Sexual Activity   Alcohol use: No   Drug use: No   Sexual activity: Not on file  Other Topics Concern   Not on file  Social History Narrative   Lives with wife, [name], with on pet,  cat.   Social Determinants of Health   Financial Resource Strain: Not on file  Food Insecurity: No Food Insecurity (01/04/2023)   Hunger Vital Sign    Worried About Running Out of Food in the Last Year: Never true    Ran Out of Food in the Last Year: Never true  Transportation Needs: No Transportation Needs (01/04/2023)   PRAPARE - Hydrologist (Medical): No    Lack of Transportation (Non-Medical): No  Physical Activity: Not on file  Stress: Not on file  Social Connections: Not on file  Intimate Partner Violence: Not At Risk (01/04/2023)   Humiliation, Afraid, Rape, and Kick questionnaire    Fear of Current or Ex-Partner: No    Emotionally Abused: No    Physically Abused: No    Sexually Abused: No    Family History  Problem Relation Age of Onset   Varicose Veins Mother    Heart disease Mother    Diabetes Mother    COPD Father      Current Outpatient Medications:    apixaban (ELIQUIS) 5 MG TABS tablet, Take 5 mg by mouth 2 (two) times daily., Disp: , Rfl:    Cholecalciferol (VITAMIN D) 50 MCG (2000 UT) CAPS, Take 2,000 Units by mouth every other day., Disp: , Rfl:    Coenzyme Q10 (COQ10) 100 MG CAPS, Take 100 mg by mouth daily., Disp: , Rfl:    flecainide (TAMBOCOR) 150 MG tablet, Take 1 tablet by mouth 2 (two) times daily., Disp: , Rfl:    insulin glargine (LANTUS) 100 UNIT/ML Solostar Pen, Inject 5 Units into the skin at bedtime., Disp: 15 mL, Rfl: 11   Insulin Pen Needle (PEN NEEDLES 29GX1/2") 29G X 12MM MISC, 1 each by Does not apply route daily in the afternoon., Disp: 100 each, Rfl: 1   metoprolol (LOPRESSOR) 100 MG tablet, Take 100 mg by mouth 2 (two) times daily., Disp: , Rfl:    Multiple Vitamins-Minerals (MULTIVITAMIN WITH MINERALS) tablet, Take 1 tablet by mouth every other day., Disp: , Rfl:    Omega-3 Fatty Acids (FISH OIL) 1000 MG CAPS, Take 1,000 mg by mouth every other day., Disp: , Rfl:    sodium bicarbonate 650 MG tablet, Take 1  tablet (650 mg total) by mouth 2 (two) times daily., Disp: 100 tablet, Rfl: 1   Turmeric 400 MG CAPS, Take 400 mg by mouth every other day., Disp: , Rfl:    vitamin B-12 (CYANOCOBALAMIN) 100 MCG tablet, Take 500 mcg by mouth every other day., Disp: , Rfl:    Vitamin E 400 units TABS, Take 400 Units by mouth every other day., Disp: , Rfl:   X-ray chest PA or AP  Result Date: 01/12/2023 CLINICAL DATA:  Port-A-Cath placement EXAM: CHEST  1 VIEW COMPARISON:  PET CT 12/22/2022, chest x-ray 10/05/2022 FINDINGS: Right-sided central venous port tip over the SVC. Negative for pneumothorax. No acute  airspace disease. Atelectasis or scar at the left base. Normal cardiomediastinal silhouette IMPRESSION: Right-sided central venous port tip over the SVC. Negative for pneumothorax. Electronically Signed   By: Donavan Foil M.D.   On: 01/12/2023 17:47   DG C-Arm 1-60 Min-No Report  Result Date: 01/12/2023 Fluoroscopy was utilized by the requesting physician.  No radiographic interpretation.   US RENAL  Result Date: 01/04/2023 CLINICAL DATA:  Acute renal failure EXAM: RENAL / URINARY TRACT ULTRASOUND COMPLETE COMPARISON:  CT abdomen pelvis 10/14/2022 FINDINGS: Right Kidney: Renal measurements: 11.2 x 6.2 x 5.5 cm = volume: 199.1 mL. Echogenicity within normal limits. No mass or hydronephrosis visualized. Left Kidney: Renal measurements: 11.4 x 5.0 x 5.5 cm = volume: 165.6 mL. Echogenicity within normal limits. No mass or hydronephrosis visualized. Note is made of a 1.9 cm cyst. No imaging follow-up needed. Bladder: Appears normal for degree of bladder distention. Other: None. IMPRESSION: No hydronephrosis. Electronically Signed   By: Lovey Newcomer M.D.   On: 01/04/2023 15:08   NM PET Image Initial (PI) Skull Base To Thigh  Result Date: 12/22/2022 CLINICAL DATA:  Initial treatment strategy for colon cancer. EXAM: NUCLEAR MEDICINE PET SKULL BASE TO THIGH TECHNIQUE: 12.95 mCi F-18 FDG was injected intravenously.  Full-ring PET imaging was performed from the skull base to thigh after the radiotracer. CT data was obtained and used for attenuation correction and anatomic localization. Fasting blood glucose: 140 mg/dl COMPARISON:  CT AP 10/14/2022 FINDINGS: Mediastinal blood pool activity: SUV max 2.81 Liver activity: SUV max NA NECK: No hypermetabolic lymph nodes in the neck. Incidental CT findings: None. CHEST: No hypermetabolic mediastinal or hilar nodes. No suspicious pulmonary nodules on the CT scan. Too small to characterize by PET-CT nodule in the inferior right middle lobe measures 3 mm, image 122/2. Incidental CT findings: Moderate right pleural effusion. Aortic atherosclerosis and coronary artery calcifications. ABDOMEN/PELVIS: There are at least 5 foci of abnormal increased radiotracer uptake involving the right lobe of liver compatible with liver metastases. -Index lesion within segment 7 measures 2.5 by 2.3 cm with SUV max of 14.73, image 130/2. -index lesion within segment 5 measures 2.7 x 2.6 cm with SUV max of 18.03, image 142/2. Soft tissue nodule within the small bowel mesentery measures 0.9 cm with SUV max of 12.17, image 165/2. Irregular, rounded structure within the right lower quadrant of the abdomen has a peripheral area of tracer avid soft tissue nodularity with SUV max of 3.03, image 211/2. This is nonspecific and may represent residual postoperative change. Nodular thickening along the left pericolic gutter adjacent to the descending colon is noted with mild increased uptake. SUV max is equal to 2.2, image 106/2. No abnormal uptake within the pancreas, spleen or adrenal glands. No tracer avid retroperitoneal or pelvic lymph nodes. Incidental CT findings: Status post right hemicolectomy with loop ileostomy Hartmann's pouch is identified which terminates at the level of the proximal transverse colon. Aortic atherosclerosis. Small focus of loculated gas is identified adjacent to the suture chain of the  Hartmann's pouch measuring approximately 9 mm, image 151/2. SKELETON: No focal hypermetabolic activity to suggest skeletal metastasis. Incidental CT findings: None. IMPRESSION: 1. Status post right hemicolectomy with loop ileostomy and Hartmann's pouch. There is a small focus of loculated gas adjacent to the suture chain of the Hartmann's pouch which is nonspecific in the postoperative setting. 2. Multifocal FDG avid liver metastases. 3. FDG avid soft tissue nodule within the small bowel mesentery compatible with metastatic adenopathy versus peritoneal disease could. 4.  Nodular thickening along the left pericolic gutter adjacent to the descending colon is noted with mild increased radiotracer uptake. This is nonspecific and may reflect postsurgical change versus early peritoneal disease. 5. Moderate right pleural effusion. 6. 3 mm right middle lobe lung nodule, too small to characterize by PET-CT. 7.  Aortic Atherosclerosis (ICD10-I70.0). Electronically Signed   By: Kerby Moors M.D.   On: 12/22/2022 12:33    No images are attached to the encounter.      Latest Ref Rng & Units 01/06/2023    4:31 AM  CMP  Glucose 70 - 99 mg/dL 157   BUN 8 - 23 mg/dL 75   Creatinine 0.61 - 1.24 mg/dL 1.68   Sodium 135 - 145 mmol/L 129   Potassium 3.5 - 5.1 mmol/L 4.5   Chloride 98 - 111 mmol/L 102   CO2 22 - 32 mmol/L 15   Calcium 8.9 - 10.3 mg/dL 9.9       Latest Ref Rng & Units 01/06/2023    4:31 AM  CBC  WBC 4.0 - 10.5 K/uL 15.1   Hemoglobin 13.0 - 17.0 g/dL 13.3   Hematocrit 39.0 - 52.0 % 39.6   Platelets 150 - 400 K/uL 211      Observation/objective: Resting comfortably in a chair.  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  He is here to discuss further management  During my last visit in January 2024 I had again explained to the patient the stage IV large cell neuroendocrine  tumor carries a dismal prognosis with an overall survival of 1 year at best with treatment.  His abdominal wound has still not healed I was concerned that chemotherapy could potentially worsen his bone healing and increases risk of complications.  Patient did go to [facility] for second opinion and both carbo etoposide and FOLFOX were deemed to be acceptable options should his performance status permit treatment.  During my video visit today it has become quite clear that his performance status has declined dramatically over the last 1 month.  He is barely able to ambulate within the house and has been spending most of his time resting.  Although he had a port in place last week I do not think that he is a candidate for chemotherapy at this time.  I would recommend best supportive care/hospice for him.  Hospice referral has already been initiated by his primary care Dr. Hoy Morn and a total care will be there to visit him today.  I am available for any questions or concerns if needs arise.  Follow-up instructions: No follow-up needed  I discussed the assessment and treatment plan with the patient. The patient was provided an opportunity to ask questions and all were answered. The patient agreed with the plan and demonstrated an understanding of the instructions.   The patient was advised to call back or seek an in-person evaluation if the symptoms worsen or if the condition fails to improve as anticipated.  I provided 30 minutes of face-to-face video visit time during this encounter, and > 50% was spent counseling as documented under my assessment & plan.  Visit Diagnosis: 1. Goals of care, counseling/discussion   2. Neuroendocrine carcinoma of colon ([place])   3. Metastases to the liver [facility])     Dr. Randa Evens, MD, MPH [facility] at [facility] Tel- [phone] 01/19/2023 3:52 PM

## 2023-02-01 ENCOUNTER — Encounter: Payer: Medicare Other | Admitting: Surgery

## 2023-02-05 DEATH — deceased

## 2023-02-13 IMAGING — NM NM HEPATO W/GB/PHARM/[PERSON_NAME]
2 series · 12 of 12 positions shown · non-contrast
Comparison: Ultrasound August 08, 2021.

CLINICAL DATA: Two months of right upper quadrant abdominal pain.

EXAM:
NUCLEAR MEDICINE HEPATOBILIARY IMAGING WITH GALLBLADDER EF
TECHNIQUE: Sequential images of the abdomen were obtained [DATE] minutes
following intravenous administration of radiopharmaceutical. After
oral ingestion of Ensure, gallbladder ejection fraction was
determined. At 60 min, normal ejection fraction is greater than 33%.
RADIOPHARMACEUTICALS:  5.04 mCi 4c-IIm  Choletec IV

[Series 1000: hepatobiliary scan · 9.59mm/px · 6 of 60 frames shown]
[frame 6/60]
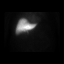
[frame 16/60]
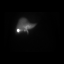
[frame 26/60]
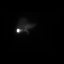
[frame 36/60]
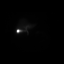
[frame 46/60]
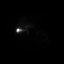
[frame 56/60]
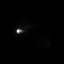

[Series 1000: gallbladder ef · 4.80mm/px · 6 of 120 frames shown]
[frame 11/120]
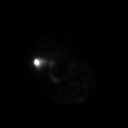
[frame 31/120]
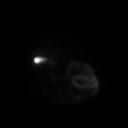
[frame 51/120]
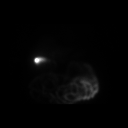
[frame 71/120]
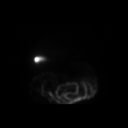
[frame 91/120]
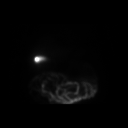
[frame 111/120]
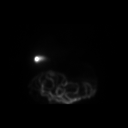

[12 of 12 positions shown; findings below may reference images not displayed]

FINDINGS: There is prompt, uniform radiotracer uptake by the liver with normal
filling of the intrahepatic ducts, common bile duct. Gallbladder
activity is visualized, consistent with patency of cystic duct
(normal < 60 minutes). Additionally there is normal biliary to bowel
transit (normal < 60 minutes), consistent with patent common bile
duct.

Ensure was administered and the gallbladder appears to empty
normally on sequential images. Calculated gallbladder ejection
fraction is 55%. (Normal gallbladder ejection fraction with Ensure
is greater than 33%.)

No evidence of enterogastric biliary reflux.
IMPRESSION: 1.  Patent cystic and common bile ducts.

2.  Normal gallbladder ejection fraction.
